# Patient Record
Sex: Female | Born: 1955 | Race: White | Hispanic: No | Smoking: Current some day smoker
Health system: Southern US, Community
[De-identification: ages and names within clinical notes are randomized; demographics above are authoritative.]

## PROBLEM LIST (undated history)

## (undated) DIAGNOSIS — J449 Chronic obstructive pulmonary disease, unspecified: Secondary | ICD-10-CM

## (undated) DIAGNOSIS — I5032 Chronic diastolic (congestive) heart failure: Secondary | ICD-10-CM

## (undated) DIAGNOSIS — G8929 Other chronic pain: Secondary | ICD-10-CM

## (undated) DIAGNOSIS — T50905A Adverse effect of unspecified drugs, medicaments and biological substances, initial encounter: Secondary | ICD-10-CM

## (undated) DIAGNOSIS — N200 Calculus of kidney: Secondary | ICD-10-CM

## (undated) DIAGNOSIS — R739 Hyperglycemia, unspecified: Secondary | ICD-10-CM

## (undated) DIAGNOSIS — E785 Hyperlipidemia, unspecified: Secondary | ICD-10-CM

## (undated) DIAGNOSIS — D3500 Benign neoplasm of unspecified adrenal gland: Secondary | ICD-10-CM

## (undated) DIAGNOSIS — S32000A Wedge compression fracture of unspecified lumbar vertebra, initial encounter for closed fracture: Secondary | ICD-10-CM

## (undated) DIAGNOSIS — I2510 Atherosclerotic heart disease of native coronary artery without angina pectoris: Secondary | ICD-10-CM

## (undated) DIAGNOSIS — J9610 Chronic respiratory failure, unspecified whether with hypoxia or hypercapnia: Secondary | ICD-10-CM

## (undated) DIAGNOSIS — M1990 Unspecified osteoarthritis, unspecified site: Secondary | ICD-10-CM

## (undated) DIAGNOSIS — I10 Essential (primary) hypertension: Secondary | ICD-10-CM

## (undated) DIAGNOSIS — D72829 Elevated white blood cell count, unspecified: Secondary | ICD-10-CM

## (undated) DIAGNOSIS — Z720 Tobacco use: Secondary | ICD-10-CM

## (undated) DIAGNOSIS — E079 Disorder of thyroid, unspecified: Secondary | ICD-10-CM

## (undated) DIAGNOSIS — I219 Acute myocardial infarction, unspecified: Secondary | ICD-10-CM

## (undated) HISTORY — DX: Atherosclerotic heart disease of native coronary artery without angina pectoris: I25.10

## (undated) HISTORY — DX: Benign neoplasm of unspecified adrenal gland: D35.00

## (undated) HISTORY — DX: Essential (primary) hypertension: I10

## (undated) HISTORY — DX: Calculus of kidney: N20.0

## (undated) HISTORY — DX: Chronic diastolic (congestive) heart failure: I50.32

## (undated) HISTORY — PX: CERVICAL BIOPSY: SHX590

## (undated) HISTORY — DX: Wedge compression fracture of unspecified lumbar vertebra, initial encounter for closed fracture: S32.000A

## (undated) HISTORY — PX: TUBAL LIGATION: SHX77

## (undated) HISTORY — DX: Elevated white blood cell count, unspecified: D72.829

## (undated) HISTORY — DX: Hyperlipidemia, unspecified: E78.5

## (undated) HISTORY — DX: Unspecified osteoarthritis, unspecified site: M19.90

## (undated) HISTORY — DX: Other chronic pain: G89.29

## (undated) HISTORY — DX: Disorder of thyroid, unspecified: E07.9

## (undated) HISTORY — DX: Hyperglycemia, unspecified: R73.9

## (undated) HISTORY — DX: Hyperglycemia, unspecified: T50.905A

---

## 2003-07-17 ENCOUNTER — Encounter: Payer: Self-pay | Admitting: Family Medicine

## 2003-07-17 ENCOUNTER — Ambulatory Visit (HOSPITAL_COMMUNITY): Admission: RE | Admit: 2003-07-17 | Discharge: 2003-07-17 | Payer: Self-pay | Admitting: Family Medicine

## 2003-07-21 ENCOUNTER — Encounter: Payer: Self-pay | Admitting: Family Medicine

## 2003-07-21 ENCOUNTER — Ambulatory Visit (HOSPITAL_COMMUNITY): Admission: RE | Admit: 2003-07-21 | Discharge: 2003-07-21 | Payer: Self-pay | Admitting: Family Medicine

## 2003-07-26 ENCOUNTER — Encounter: Payer: Self-pay | Admitting: Family Medicine

## 2003-07-26 ENCOUNTER — Ambulatory Visit (HOSPITAL_COMMUNITY): Admission: RE | Admit: 2003-07-26 | Discharge: 2003-07-26 | Payer: Self-pay | Admitting: Family Medicine

## 2007-06-01 ENCOUNTER — Ambulatory Visit (HOSPITAL_COMMUNITY): Admission: RE | Admit: 2007-06-01 | Discharge: 2007-06-01 | Payer: Self-pay | Admitting: Family Medicine

## 2009-11-20 ENCOUNTER — Ambulatory Visit (HOSPITAL_COMMUNITY): Admission: RE | Admit: 2009-11-20 | Discharge: 2009-11-20 | Payer: Self-pay | Admitting: Family Medicine

## 2009-11-20 ENCOUNTER — Encounter: Payer: Self-pay | Admitting: Internal Medicine

## 2009-11-26 ENCOUNTER — Encounter: Payer: Self-pay | Admitting: Internal Medicine

## 2009-12-24 ENCOUNTER — Ambulatory Visit: Payer: Self-pay | Admitting: Internal Medicine

## 2009-12-24 DIAGNOSIS — J449 Chronic obstructive pulmonary disease, unspecified: Secondary | ICD-10-CM | POA: Insufficient documentation

## 2009-12-24 DIAGNOSIS — J441 Chronic obstructive pulmonary disease with (acute) exacerbation: Secondary | ICD-10-CM | POA: Insufficient documentation

## 2010-01-24 ENCOUNTER — Ambulatory Visit: Payer: Self-pay | Admitting: Internal Medicine

## 2010-07-16 ENCOUNTER — Emergency Department (HOSPITAL_COMMUNITY): Admission: EM | Admit: 2010-07-16 | Discharge: 2010-07-16 | Payer: Self-pay | Admitting: Emergency Medicine

## 2010-07-18 ENCOUNTER — Ambulatory Visit (HOSPITAL_COMMUNITY): Admission: RE | Admit: 2010-07-18 | Discharge: 2010-07-18 | Payer: Self-pay | Admitting: Family Medicine

## 2011-01-30 NOTE — Assessment & Plan Note (Signed)
Summary: 1 month/apc   Copy to:  Dr. Assunta Found Primary Provider/Referring Provider:  Dr. Assunta Found  CC:  1 month follow up - states breathing is improved but is not 100%. .  History of Present Illness: 60 yowf smoker not typically limited but noticing she's been " slowing down a bit"  for around a year assoc with periods of much worse sob assoc with  wheezing and coughing.  December 24, 2009 ov doing better after additon of multiple inhalers but still limited so not able yardwork,  has to walk slow at supermarket.   has tried advair not sure it helped, same for symbicort but never really used it consistently.    January 24, 2010--Presents for 1 month follow up - states breathing is improved but is not 100%.   She is using Symbicort. She has no insurance or rx coverage. She is feeling better but has intermittent cough and congesiton-mainly white. Denies chest pain, dyspnea, orthopnea, hemoptysis, fever, n/v/d, edema, headache,recent travel or antibiotics, weight loss.   Preventive Screening-Counseling & Management  Alcohol-Tobacco     Smoking Status: current  Medications Prior to Update: 1)  Proair Hfa 108 (90 Base) Mcg/act Aers (Albuterol Sulfate) .... 2 Puffs Every 4-6 Hours As Needed 2)  Symbicort 160-4.5 Mcg/act Aero (Budesonide-Formoterol Fumarate) .... 2 Puffs Every 12 Hours 3)  Oxygen 2.5 Liters .... At Bedtime 4)  Albuterol Sulfate (2.5 Mg/7ml) 0.083% Nebu (Albuterol Sulfate) .Marland Kitchen.. 1 in Nebulizer Every 6 Hours As Needed  Current Medications (verified): 1)  Proair Hfa 108 (90 Base) Mcg/act Aers (Albuterol Sulfate) .... 2 Puffs Every 4-6 Hours As Needed 2)  Symbicort 160-4.5 Mcg/act Aero (Budesonide-Formoterol Fumarate) .... 2 Puffs Every 12 Hours 3)  Oxygen 2.5 Liters .... At Bedtime 4)  Albuterol Sulfate (2.5 Mg/85ml) 0.083% Nebu (Albuterol Sulfate) .Marland Kitchen.. 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  Smokes 1ppd No ETOH  Risk Factors: Smoking Status: current (01/24/2010)  Social History: Smoking Status:  current  Review of Systems      See HPI  Vital Signs:  Patient profile:   55 year old female Height:      63 inches Weight:      138 pounds BMI:     24.53 O2 Sat:      95 % on Room air Temp:     96.6 degrees F oral Pulse rate:   87 / minute BP sitting:   122 / 68  (left arm) Cuff size:   regular  Vitals Entered By: Boone Master CNA (January 24, 2010 4:39 PM)  O2 Flow:  Room air CC: 1 month follow up - states breathing is improved but is not 100%.  Is Patient Diabetic? No Comments Medications reviewed with patient Daytime contact number verified with patient. Boone Master CNA  January 24, 2010 4:40 PM    Physical Exam  Additional Exam:  amb wf    wt 132 December 24, 2009 >>138 January 24, 2010 5:10 PM  HEENT mild turbinate edema.  Oropharynx no thrush or excess pnd or cobblestoning.  No JVD or cervical adenopathy. Mild accessory muscle hypertrophy. Trachea midline, nl thryroid. Chest was  hyperinflated by percussion with diminished breath sounds and moderate increased exp time without wheeze. Hoover sign positive at mid inspiration. Regular rate and rhythm without murmur gallop or rub or increase P2 or edema.  Abd: no hsm, nl excursion. Ext warm without cyanosis or clubbing.     Impression & Recommendations:  Problem # 1:  C O P D (ICD-496)  Improved control on rx. however pt does have any health insurance or rx coverage.  Will give samples for now. Have suggested [employer] and/or Medicaid. -she declines at this time.  Rec to follow up Dr. Sherene Sires in  4 weeks w/ PFTs - she does not know is she will come back d/t cost factor We discussed various options and she said she would consider them .   Orders: Est. Patient Level I (56213)  Complete Medication List: 1)  Proair Hfa 108 (90 Base) Mcg/act Aers (Albuterol sulfate) .... 2 puffs every 4-6 hours as needed 2)  Symbicort 160-4.5 Mcg/act Aero (Budesonide-formoterol fumarate) .... 2 puffs every 12 hours 3)  Oxygen 2.5 Liters  .... At bedtime 4)  Albuterol Sulfate (2.5 Mg/58ml) 0.083% Nebu (Albuterol sulfate) .Marland Kitchen.. 1 in nebulizer every 6 hours as needed  Patient Instructions: 1)  Use Spacer with Inhalers.  2)  Continue on Symbicort 160/4.44mcg 2 puffs two times a day , brush, rinse and gargle after use.  3)  Mucinex DM two times a day as needed cough  4)  follow up 1 month with PFTs  Dr. Sherene Sires    Immunization History:  Influenza Immunization History:    Influenza:  declines (01/24/2010)  Pneumovax Immunization History:    Pneumovax:  n/a (01/24/2010)

## 2011-09-24 ENCOUNTER — Inpatient Hospital Stay (HOSPITAL_COMMUNITY)
Admission: EM | Admit: 2011-09-24 | Discharge: 2011-09-27 | DRG: 192 | Disposition: A | Discharge status: Home / Self Care | Payer: Medicaid Other | Attending: Internal Medicine | Admitting: Internal Medicine

## 2011-09-24 ENCOUNTER — Emergency Department (HOSPITAL_COMMUNITY): Payer: Medicaid Other

## 2011-09-24 DIAGNOSIS — Z720 Tobacco use: Secondary | ICD-10-CM | POA: Diagnosis present

## 2011-09-24 DIAGNOSIS — T50905A Adverse effect of unspecified drugs, medicaments and biological substances, initial encounter: Secondary | ICD-10-CM | POA: Diagnosis present

## 2011-09-24 DIAGNOSIS — R7309 Other abnormal glucose: Secondary | ICD-10-CM | POA: Diagnosis present

## 2011-09-24 DIAGNOSIS — R0902 Hypoxemia: Secondary | ICD-10-CM | POA: Diagnosis present

## 2011-09-24 DIAGNOSIS — J441 Chronic obstructive pulmonary disease with (acute) exacerbation: Principal | ICD-10-CM | POA: Diagnosis present

## 2011-09-24 DIAGNOSIS — R739 Hyperglycemia, unspecified: Secondary | ICD-10-CM | POA: Diagnosis present

## 2011-09-24 DIAGNOSIS — F17200 Nicotine dependence, unspecified, uncomplicated: Secondary | ICD-10-CM | POA: Diagnosis present

## 2011-09-24 DIAGNOSIS — T380X5A Adverse effect of glucocorticoids and synthetic analogues, initial encounter: Secondary | ICD-10-CM | POA: Diagnosis present

## 2011-09-24 HISTORY — DX: Chronic obstructive pulmonary disease, unspecified: J44.9

## 2011-09-24 LAB — COMPREHENSIVE METABOLIC PANEL
AST: 18 U/L (ref 0–37)
Albumin: 3.7 g/dL (ref 3.5–5.2)
Alkaline Phosphatase: 91 U/L (ref 39–117)
BUN: 13 mg/dL (ref 6–23)
Chloride: 98 mEq/L (ref 96–112)
Potassium: 3.5 mEq/L (ref 3.5–5.1)
Sodium: 137 mEq/L (ref 135–145)
Total Bilirubin: 0.2 mg/dL — ABNORMAL LOW (ref 0.3–1.2)
Total Protein: 6.5 g/dL (ref 6.0–8.3)

## 2011-09-24 LAB — CBC
HCT: 44 % (ref 36.0–46.0)
MCHC: 33.4 g/dL (ref 30.0–36.0)
Platelets: 281 10*3/uL (ref 150–400)
RDW: 13.8 % (ref 11.5–15.5)
WBC: 14.6 10*3/uL — ABNORMAL HIGH (ref 4.0–10.5)

## 2011-09-24 LAB — MAGNESIUM: Magnesium: 2 mg/dL (ref 1.5–2.5)

## 2011-09-24 LAB — PHOSPHORUS: Phosphorus: 2.6 mg/dL (ref 2.3–4.6)

## 2011-09-24 MED ORDER — ALBUTEROL SULFATE (5 MG/ML) 0.5% IN NEBU
INHALATION_SOLUTION | RESPIRATORY_TRACT | Status: AC
  Filled 2011-09-24: qty 0.5

## 2011-09-24 MED ORDER — SODIUM CHLORIDE 0.9 % IV SOLN
INTRAVENOUS | Status: AC
  Administered 2011-09-24: 75 mL via INTRAVENOUS

## 2011-09-24 MED ORDER — BUDESONIDE-FORMOTEROL FUMARATE 160-4.5 MCG/ACT IN AERO
2.0000 | INHALATION_SPRAY | Freq: Two times a day (BID) | RESPIRATORY_TRACT | Status: DC
  Administered 2011-09-25 – 2011-09-27 (×5): 2 via RESPIRATORY_TRACT
  Filled 2011-09-24: qty 6

## 2011-09-24 MED ORDER — ENOXAPARIN SODIUM 40 MG/0.4ML ~~LOC~~ SOLN
40.0000 mg | SUBCUTANEOUS | Status: DC
  Administered 2011-09-24 – 2011-09-26 (×3): 40 mg via SUBCUTANEOUS
  Filled 2011-09-24 (×3): qty 0.4

## 2011-09-24 MED ORDER — ALBUTEROL SULFATE (5 MG/ML) 0.5% IN NEBU
2.5000 mg | INHALATION_SOLUTION | Freq: Once | RESPIRATORY_TRACT | Status: AC
  Administered 2011-09-24: 2.5 mg via RESPIRATORY_TRACT
  Filled 2011-09-24: qty 0.5

## 2011-09-24 MED ORDER — MOXIFLOXACIN HCL IN NACL 400 MG/250ML IV SOLN
INTRAVENOUS | Status: AC
  Filled 2011-09-24: qty 250

## 2011-09-24 MED ORDER — PREDNISONE 20 MG PO TABS
60.0000 mg | ORAL_TABLET | Freq: Once | ORAL | Status: AC
  Administered 2011-09-24: 60 mg via ORAL
  Filled 2011-09-24: qty 3

## 2011-09-24 MED ORDER — MOXIFLOXACIN HCL IN NACL 400 MG/250ML IV SOLN
400.0000 mg | INTRAVENOUS | Status: AC
  Administered 2011-09-24 – 2011-09-25 (×2): 400 mg via INTRAVENOUS
  Filled 2011-09-24 (×2): qty 250

## 2011-09-24 MED ORDER — PNEUMOCOCCAL VAC POLYVALENT 25 MCG/0.5ML IJ INJ
0.5000 mL | INJECTION | INTRAMUSCULAR | Status: AC
  Administered 2011-09-25: 0.5 mL via INTRAMUSCULAR
  Filled 2011-09-24: qty 0.5

## 2011-09-24 MED ORDER — ALUM & MAG HYDROXIDE-SIMETH 200-200-20 MG/5ML PO SUSP: 30.0000 mL | Freq: Four times a day (QID) | ORAL | Status: DC | PRN

## 2011-09-24 MED ORDER — ONDANSETRON HCL 4 MG/2ML IJ SOLN: 4.0000 mg | Freq: Four times a day (QID) | INTRAMUSCULAR | Status: DC | PRN

## 2011-09-24 MED ORDER — IPRATROPIUM BROMIDE 0.02 % IN SOLN
0.5000 mg | Freq: Once | RESPIRATORY_TRACT | Status: AC
  Administered 2011-09-24: 0.5 mg via RESPIRATORY_TRACT
  Filled 2011-09-24: qty 2.5

## 2011-09-24 MED ORDER — ACETAMINOPHEN 650 MG RE SUPP: 650.0000 mg | Freq: Four times a day (QID) | RECTAL | Status: DC | PRN

## 2011-09-24 MED ORDER — METHYLPREDNISOLONE SODIUM SUCC 125 MG IJ SOLR
60.0000 mg | Freq: Three times a day (TID) | INTRAMUSCULAR | Status: AC
  Administered 2011-09-24 – 2011-09-25 (×4): 60 mg via INTRAVENOUS
  Filled 2011-09-24 (×4): qty 2

## 2011-09-24 MED ORDER — ALBUTEROL SULFATE (5 MG/ML) 0.5% IN NEBU
2.5000 mg | INHALATION_SOLUTION | RESPIRATORY_TRACT | Status: DC
  Administered 2011-09-24 – 2011-09-26 (×10): 2.5 mg via RESPIRATORY_TRACT
  Filled 2011-09-24 (×9): qty 0.5

## 2011-09-24 MED ORDER — IPRATROPIUM BROMIDE 0.02 % IN SOLN
0.5000 mg | RESPIRATORY_TRACT | Status: DC
  Administered 2011-09-24 – 2011-09-26 (×10): 0.5 mg via RESPIRATORY_TRACT
  Filled 2011-09-24 (×8): qty 2.5

## 2011-09-24 MED ORDER — ONDANSETRON HCL 4 MG PO TABS: 4.0000 mg | ORAL_TABLET | Freq: Four times a day (QID) | ORAL | Status: DC | PRN

## 2011-09-24 MED ORDER — TIOTROPIUM BROMIDE MONOHYDRATE 18 MCG IN CAPS
ORAL_CAPSULE | RESPIRATORY_TRACT | Status: AC
  Filled 2011-09-24: qty 5

## 2011-09-24 MED ORDER — ACETAMINOPHEN 325 MG PO TABS
650.0000 mg | ORAL_TABLET | Freq: Four times a day (QID) | ORAL | Status: DC | PRN
  Administered 2011-09-24: 650 mg via ORAL
  Filled 2011-09-24: qty 2

## 2011-09-24 MED ORDER — PANTOPRAZOLE SODIUM 40 MG PO TBEC
40.0000 mg | DELAYED_RELEASE_TABLET | Freq: Every day | ORAL | Status: DC
  Administered 2011-09-24 – 2011-09-27 (×4): 40 mg via ORAL
  Filled 2011-09-24 (×4): qty 1

## 2011-09-24 MED ORDER — TIOTROPIUM BROMIDE MONOHYDRATE 18 MCG IN CAPS
18.0000 ug | ORAL_CAPSULE | Freq: Every day | RESPIRATORY_TRACT | Status: DC
  Administered 2011-09-26 (×2): 18 ug via RESPIRATORY_TRACT
  Filled 2011-09-24: qty 5

## 2011-09-24 MED ORDER — ALBUTEROL SULFATE (5 MG/ML) 0.5% IN NEBU
5.0000 mg | INHALATION_SOLUTION | Freq: Once | RESPIRATORY_TRACT | Status: AC
  Administered 2011-09-24: 5 mg via RESPIRATORY_TRACT
  Filled 2011-09-24: qty 1

## 2011-09-24 MED ORDER — NICOTINE 14 MG/24HR TD PT24
14.0000 mg | MEDICATED_PATCH | Freq: Every day | TRANSDERMAL | Status: DC
  Administered 2011-09-24 – 2011-09-27 (×4): 14 mg via TRANSDERMAL
  Filled 2011-09-24 (×4): qty 1

## 2011-09-24 MED ORDER — MORPHINE SULFATE 2 MG/ML IJ SOLN
1.0000 mg | Freq: Four times a day (QID) | INTRAMUSCULAR | Status: DC | PRN
  Administered 2011-09-26: 1 mg via INTRAVENOUS
  Filled 2011-09-24: qty 1

## 2011-09-24 NOTE — ED Provider Notes (Signed)
History   Chart scribed for Cindy Chick, MD by Enos Fling; the patient was seen in room APA14/APA14; this patient's care was started at 12:59 PM.    CSN: [id number] Arrival date & time: 09/24/2011 11:38 AM  Chief Complaint  Patient presents with  . Shortness of Breath   HPI Cindy [name] is a 55 y.o. female with h/o COPD/emphysema who presents to the Emergency Department complaining of cough. Pt c/o productive cough and wheezing x 1 week, reports she was seen by PCP 6 days ago and prescribed prednisone and augmentin which she finished today with no change in symptoms. Pt has been using albuterol twice per day. No fever, headache, or chest pain. Pt is current smoker. States she feels a little better after first breathing treatment in ED.   PCP Dr. Phillips Odor  Past Medical History  Diagnosis Date  . COPD (chronic obstructive pulmonary disease)     History reviewed. No pertinent past surgical history.  History reviewed. No pertinent family history.  History  Substance Use Topics  . Smoking status: Current Everyday Smoker -- 1.0 packs/day    Types: Cigarettes  . Smokeless tobacco: Not on file  . Alcohol Use:     OB History    Grav Para Term Preterm Abortions TAB SAB Ect Mult Living                  Review of Systems 10 Systems reviewed and are negative for acute change except as noted in the HPI.  Allergies  Review of patient's allergies indicates no known allergies.  Home Medications   Current Outpatient Rx  Name Route Sig Dispense Refill  . AMOXICILLIN-POT CLAVULANATE 875-125 MG PO TABS Oral Take 1 tablet by mouth 2 (two) times daily.      Marland Kitchen PREDNISONE 10 MG PO TABS Oral Take 10 mg by mouth daily. Tapered dose       BP 125/74  Pulse 76  Temp(Src) 98.2 F (36.8 C) (Oral)  Resp 20  Ht 5\' 3"  (1.6 m)  Wt 134 lb (60.782 kg)  BMI 23.74 kg/m2  SpO2 97%  Physical Exam  Nursing note and vitals reviewed. Constitutional: She is oriented to person, place, and  time. She appears well-developed and well-nourished. No distress.  HENT:  Head: Normocephalic.  Mouth/Throat: Mucous membranes are normal.  Eyes: Conjunctivae are normal.  Neck: Normal range of motion. Neck supple.  Cardiovascular: Normal rate, regular rhythm and intact distal pulses.  Exam reveals no gallop and no friction rub.   No murmur heard. Pulmonary/Chest: Effort normal. No respiratory distress. She has wheezes (bilateral). She has no rales. She exhibits no tenderness.       Course breath sounds; Mild decreased air movement; no retractions or increased respiratory effort  Abdominal: Soft. There is no tenderness.  Musculoskeletal: Normal range of motion. She exhibits no edema.  Neurological: She is alert and oriented to person, place, and time.  Skin: Skin is warm and dry. No rash noted.  Psychiatric: She has a normal mood and affect.    ED Course  Procedures - none  Labs Reviewed - No data to display Dg Chest 2 View  09/24/2011  *RADIOLOGY REPORT*  Clinical Data: Long-time smoker with history of COPD, presenting with 39-month history of shortness of breath.  CHEST - 2 VIEW 09/24/2011:  Comparison: Two-view chest x-ray 11/20/2009 [facility] and 06/28/2007 [facility].  Findings: Marked hyperinflation and emphysematous changes throughout both lungs, unchanged.  Mild central peribronchial  thickening, unchanged.  No new pulmonary parenchymal abnormalities. Cardiomediastinal silhouette unremarkable and unchanged. Visualized bony thorax intact.  No significant interval change.  IMPRESSION: COPD/emphysema.  No acute cardiopulmonary disease.  Stable examination.  Original Report Authenticated By: Arnell Sieving, M.D.    MEDICATIONS GIVEN IN ED:  Medications  predniSONE (DELTASONE) 10 MG tablet (not administered)  amoxicillin-clavulanate (AUGMENTIN) 875-125 MG per tablet (not administered)  albuterol (PROVENTIL) (5 MG/ML) 0.5% nebulizer solution 5 mg (5 mg  Nebulization Given 09/24/11 1231)  albuterol (PROVENTIL) (5 MG/ML) 0.5% nebulizer solution 2.5 mg (2.5 mg Nebulization Given 09/24/11 1319)  ipratropium (ATROVENT) 0.02 % nebulizer solution 0.5 mg (0.5 mg Nebulization Given 09/24/11 1318)  predniSONE (DELTASONE) tablet 60 mg (60 mg Oral Given 09/24/11 1308)  albuterol (PROVENTIL) (5 MG/ML) 0.5% nebulizer solution 2.5 mg (2.5 mg Nebulization Given 09/24/11 1424)  ipratropium (ATROVENT) 0.02 % nebulizer solution 0.5 mg (0.5 mg Nebulization Given 09/24/11 1423)     MDM  1:51 PM Pt maintaining O2 sats appro x94-95% on RA.  Re-dosed with steroids, will give tapered course.  Pt beginning 3rd neb treatment now.  STates she feels somewhat improved.  CXR without acute findings but c/w COPD  3:18 PM Pt was O2 sats now 89-91 on RA.  1L [place] started.  D/w pt that she will need admission for furhter treatment.  D/w [facility] for admission- AP team 2   IMPRESSION:COPD exacerbationi No diagnosis found.   SCRIBE ATTESTATION: I personally performed the services described in this documentation, which was scribed in my presence. The recorded information has been reviewed and considered. Cindy Chick, MD         Cindy Chick, MD 09/24/11 1520

## 2011-09-24 NOTE — ED Notes (Signed)
Pt states she has had a cough and wheezing since last week. States she was given prednisone and an antibiotic ( augmentin ) today was last dose. States she is not getting any better

## 2011-09-24 NOTE — H&P (Signed)
PCP:    Dr. Assunta Found  Chief Complaint:  Increase shortness of breath and wheezing  HPI: 55 year old [name] came to the hospital complaining of increased shortness of breath and wheezing. The patient reports that for the last month or so, she has been experiencing increase shortness of breath and has increase the use of her albuterol; recently seen by PCP and finished outpatient tx for mild exacerbation of her COPD without to much changes on her symptoms. Patient denies CP, fever, chills, nausea, vomiting, abdominal pain or any other complaints. Of note patient is still smoking and due to financial difficulties has not been using her Spiriva.   Allergies:  No Known Allergies    Past Medical History  Diagnosis Date  . COPD (chronic obstructive pulmonary disease) Tobacco abuse      History reviewed. No pertinent past surgical history.  Prior to Admission medications   Medication Sig Start Date End Date Taking? Authorizing Provider  amoxicillin-clavulanate (AUGMENTIN) 875-125 MG per tablet Take 1 tablet by mouth 2 (two) times daily.     Yes Historical Provider, MD  budesonide-formoterol (SYMBICORT) 160-4.5 MCG/ACT inhaler Inhale 2 puffs into the lungs 2 (two) times daily.     Yes Historical Provider, MD  predniSONE (DELTASONE) 10 MG tablet Take 10 mg by mouth daily. Tapered dose    Yes Historical Provider, MD    Social History:  reports that she has been smoking Cigarettes.  She has been smoking about 1 pack per day. She reports that she does not use illicit drugs and denies alcohol.  History reviewed. No pertinent family history.  Review of Systems:  Constitutional: Denies fever, chills, diaphoresis, appetite change and fatigue.  HEENT: Denies photophobia, eye pain, redness, hearing loss, ear pain, congestion, sore throat, rhinorrhea, sneezing, mouth sores, trouble swallowing, neck pain, neck stiffness and tinnitus.   Respiratory: Denies SOB, DOE, cough, chest tightness,  and  wheezing.   Cardiovascular: Denies chest pain, palpitations and leg swelling.  Gastrointestinal: Denies nausea, vomiting, abdominal pain, diarrhea, constipation, blood in stool and abdominal distention.  Genitourinary: Denies dysuria, urgency, frequency, hematuria, flank pain and difficulty urinating.  Musculoskeletal: Denies myalgias, back pain, joint swelling, arthralgias and gait problem.  Skin: Denies pallor, rash and wound.  Neurological: Denies dizziness, seizures, syncope, weakness, light-headedness, numbness and headaches.  Hematological: Denies adenopathy. Easy bruising, personal or family bleeding history  Psychiatric/Behavioral: Denies suicidal ideation, mood changes, confusion, nervousness, sleep disturbance and agitation   Physical Exam: Blood pressure 108/86, pulse 100, temperature 98.2 F (36.8 C), temperature source Oral, resp. rate 18, height 5\' 3"  (1.6 m), weight 60.782 kg (134 lb), SpO2 95.00%.  Constitutional: She is oriented to person, place, and time. She appears well-developed and well-nourished. No distress and able to speak in full sentences. HEENT: Normocephalic. Atraumatic; moist mucous membranes; Eyes: PERRLA, EOMI.  Neck: Normal range of motion. Neck supple.  Cardiovascular: Mild tachycardia, no murmurs, no gallops and no friction rub.  Pulmonary/Chest: Effort normal. No respiratory distress. She has diffuse wheezes (bilateral). She has no rales.Course breath sounds; Mild decreased air movement. Abdominal: Soft. There is no tenderness.  Musculoskeletal: Normal range of motion. She exhibits no edema.  Neurological: She is alert and oriented to person, place, and time. NO focal deficit and CN II-XII grossly intact.   Labs on Admission:  Labs on admission has been drawn but, are pending at this moment.  Radiological Exams on Admission: Chest x-ray demonstrating emphysematous changes without acute infiltrates.  Assessment/Plan 1-SOB and wheezing- Patient  shortness  of breath and wheezing secondary to COPD with acute exacerbation. Will start duonebs, antibiotics and solumedrol while continuing her inhalers. 2-Hypoxia- Due to problem number 1; will treat acute exacerbation and evaluate needs of supplemental oxygen as an outpatient. 3-Tobacco abuse- will place nicotine patch and provide cessation counseling. 4-DVT- Will use lovenox.     Time Spent on Admission: 45 minutes.  Om Lizotte 09/24/2011, 4:39 PM

## 2011-09-24 NOTE — ED Notes (Signed)
Resp. Tech in with patient to administer breathing treatment at this time.

## 2011-09-24 NOTE — ED Notes (Signed)
Pt states breathing some better. Waiting on second breathing treatment at this time.

## 2011-09-24 NOTE — ED Notes (Signed)
Dr. Isidoro Donning paged for Dr. Karma Ganja.

## 2011-09-24 NOTE — ED Notes (Signed)
Pt states she finishes her antibiotics today for this breathing problem and does not feel any better. Pt state worse with exertion but is SOB at rest. Pt states unable to see specialist due to cost.

## 2011-09-25 ENCOUNTER — Observation Stay (HOSPITAL_COMMUNITY): Payer: Medicaid Other

## 2011-09-25 DIAGNOSIS — R739 Hyperglycemia, unspecified: Secondary | ICD-10-CM | POA: Diagnosis present

## 2011-09-25 LAB — BASIC METABOLIC PANEL
Chloride: 100 mEq/L (ref 96–112)
GFR calc Af Amer: >60 mL/min (ref >60)
GFR calc non Af Amer: >60 mL/min (ref >60)
Potassium: 4 mEq/L (ref 3.5–5.1)
Sodium: 139 mEq/L (ref 135–145)

## 2011-09-25 LAB — CBC
Hemoglobin: 15.1 g/dL — ABNORMAL HIGH (ref 12.0–15.0)
MCHC: 33.7 g/dL (ref 30.0–36.0)
RDW: 13.7 % (ref 11.5–15.5)
WBC: 12.2 10*3/uL — ABNORMAL HIGH (ref 4.0–10.5)

## 2011-09-25 LAB — HEMOGLOBIN A1C
Hgb A1c MFr Bld: 6.4 % — ABNORMAL HIGH (ref <5.7)
Mean Plasma Glucose: 137 mg/dL — ABNORMAL HIGH (ref <117)

## 2011-09-25 MED ORDER — SODIUM CHLORIDE 0.9 % IJ SOLN
INTRAMUSCULAR | Status: AC
  Administered 2011-09-25: 01:00:00
  Filled 2011-09-25: qty 10

## 2011-09-25 MED ORDER — PREDNISONE 20 MG PO TABS
80.0000 mg | ORAL_TABLET | Freq: Two times a day (BID) | ORAL | Status: DC
  Administered 2011-09-26 – 2011-09-27 (×3): 80 mg via ORAL
  Filled 2011-09-25 (×3): qty 4

## 2011-09-25 MED ORDER — MOXIFLOXACIN HCL 400 MG PO TABS
400.0000 mg | ORAL_TABLET | Freq: Every day | ORAL | Status: DC
  Administered 2011-09-26 – 2011-09-27 (×2): 400 mg via ORAL
  Filled 2011-09-25 (×2): qty 1

## 2011-09-25 MED ORDER — INFLUENZA VIRUS VACC SPLIT PF IM SUSP
0.5000 mL | Freq: Once | INTRAMUSCULAR | Status: AC
  Administered 2011-09-25: 0.5 mL via INTRAMUSCULAR
  Filled 2011-09-25: qty 0.5

## 2011-09-25 NOTE — Progress Notes (Signed)
Subjective: No chest pain, no fever, significant improvement in patient's air movement and breathing overall. Also decrease on her wheezing.  Objective: Vital signs in last 24 hours: Temp:  [97.6 F (36.4 C)-98.3 F (36.8 C)] 97.6 F (36.4 C) (09/27 1427) Pulse Rate:  [77-100] 78  (09/27 1427) Resp:  [16-20] 16  (09/27 1427) BP: (98-130)/(62-86) 100/62 mmHg (09/27 1427) SpO2:  [91 %-97 %] 93 % (09/27 1427) Weight:  [62.9 kg (138 lb 10.7 oz)] 138 lb 10.7 oz (62.9 kg) (09/26 1745) Weight change:  Last BM Date: 09/22/11  Intake/Output from previous day:   Total I/O In: 1155 [I.V.:1155] Out: -    Physical Exam: General: Alert, awake, oriented x3, in no acute distress; able to speak in full sentences. HEENT: No bruits, no goiter. Heart: Regular rate and rhythm, without murmurs, rubs, gallops. Lungs: Mild expiratory wheezing bilaterally, no crackles. Abdomen: Soft, nontender, nondistended, positive bowel sounds. Extremities: No clubbing cyanosis or edema with positive pedal pulses. Neuro: Grossly intact, nonfocal.    Lab Results: Basic Metabolic Panel:  Basename 09/25/11 0349 09/24/11 1739  NA 139 137  K 4.0 3.5  CL 100 98  CO2 30 28  GLUCOSE 115* 211*  BUN 12 13  CREATININE 0.61 0.71  CALCIUM 9.6 9.5  MG -- 2.0  PHOS -- 2.6   Liver Function Tests:  Basename 09/24/11 1739  AST 18  ALT 22  ALKPHOS 91  BILITOT 0.2*  PROT 6.5  ALBUMIN 3.7   CBC:  Basename 09/25/11 0349 09/24/11 1739  WBC 12.2* 14.6*  NEUTROABS -- --  HGB 15.1* 14.7  HCT 44.8 44.0  MCV 90.9 91.3  PLT 290 281    Hemoglobin A1C:  Basename 09/24/11 1739  HGBA1C 6.4*   Thyroid Function Tests:  Basename 09/24/11 1739  TSH 0.354  T4TOTAL --  FREET4 --  T3FREE --  THYROIDAB --   Studies/Results: X-ray Chest Pa And Lateral   09/25/2011  *RADIOLOGY REPORT*  Clinical Data: Shortness of breath, cough, wheezing  CHEST - 2 VIEW  Comparison: September 24, 2011 and November 20, 2009   Findings: The cardiac silhouette, mediastinum, pulmonary vasculature are within normal limits.  Both lungs are clear.  There is hyperexpansion which suggests an element of COPD. There is no acute bony abnormality.  IMPRESSION: There is no evidence of acute cardiac or pulmonary process.  COPD.  Original Report Authenticated By: Brandon Melnick, M.D.   Dg Chest 2 View  09/24/2011  *RADIOLOGY REPORT*  Clinical Data: Long-time smoker with history of COPD, presenting with 58-month history of shortness of breath.  CHEST - 2 VIEW 09/24/2011:  Comparison: Two-view chest x-ray 11/20/2009 [facility] and 06/28/2007 [facility].  Findings: Marked hyperinflation and emphysematous changes throughout both lungs, unchanged.  Mild central peribronchial thickening, unchanged.  No new pulmonary parenchymal abnormalities. Cardiomediastinal silhouette unremarkable and unchanged. Visualized bony thorax intact.  No significant interval change.  IMPRESSION: COPD/emphysema.  No acute cardiopulmonary disease.  Stable examination.  Original Report Authenticated By: Arnell Sieving, M.D.    Medications: Scheduled Meds:   . ipratropium  0.5 mg Nebulization Q4H   And  . albuterol  2.5 mg Nebulization Q4H  . budesonide-formoterol  2 puff Inhalation BID  . enoxaparin  40 mg Subcutaneous Q24H  . influenza  inactive virus vaccine  0.5 mL Intramuscular Once  . methylPREDNISolone sodium succinate  60 mg Intravenous Q8H  . moxifloxacin  400 mg Intravenous Q24H  . moxifloxacin  400 mg Oral q1800  .  nicotine  14 mg Transdermal Daily  . pantoprazole  40 mg Oral Q1200  . pneumococcal 23 valent vaccine  0.5 mL Intramuscular Tomorrow-1000  . predniSONE  80 mg Oral BID  . sodium chloride      . tiotropium  18 mcg Inhalation Daily   Continuous Infusions:   . sodium chloride 75 mL (09/24/11 1736)   PRN Meds:.acetaminophen, acetaminophen, alum & mag hydroxide-simeth, morphine, ondansetron (ZOFRAN) IV,  ondansetron  Assessment/Plan: 1- COPD with acute exacerbation- improved. Continue nebulizers, continue antibiotics and continue steroids. At this point we'll ask nurses to check O2 sats on ambulation in order to determine the need of supplemental oxygen as an outpatient. Will make transition to PO in the morning if she continue improving. 2-Tobacco abuse- continue nicotine patch  3- Hypoxia- Due to acute exacerbation; will evaluate needs of O2 supplementation. 4-Hyperglycemia- drug induced; no hx of diabetes. Expecting resolution once steroids tapered down. 5- DVT prophylaxis- Continue Lovenox.     LOS: 1 day   Bonnie Overdorf 09/25/2011, 2:34 PM

## 2011-09-26 MED ORDER — GUAIFENESIN ER 600 MG PO TB12
600.0000 mg | ORAL_TABLET | Freq: Two times a day (BID) | ORAL | Status: DC
  Administered 2011-09-26 – 2011-09-27 (×2): 600 mg via ORAL
  Filled 2011-09-26 (×2): qty 1

## 2011-09-26 MED ORDER — IPRATROPIUM-ALBUTEROL 18-103 MCG/ACT IN AERO
2.0000 | INHALATION_SPRAY | Freq: Four times a day (QID) | RESPIRATORY_TRACT | Status: DC
  Administered 2011-09-26 (×2): 2 via RESPIRATORY_TRACT
  Filled 2011-09-26: qty 14.7

## 2011-09-26 NOTE — Progress Notes (Signed)
Subjective: Doing great. No acute complaints or overnight events. Breathing comfortable and currently no wheezing. No fever.  Objective: Vital signs in last 24 hours: Temp:  [97.3 F (36.3 C)-98.3 F (36.8 C)] 98.2 F (36.8 C) (09/28 1300) Pulse Rate:  [78-88] 78  (09/28 1300) Resp:  [16-18] 16  (09/28 1300) BP: (118-128)/(78-83) 118/78 mmHg (09/28 1300) SpO2:  [94 %-98 %] 96 % (09/28 1300) Weight change:  Last BM Date: 09/24/11  Intake/Output from previous day: 09/27 0701 - 09/28 0700 In: 2515 [P.O.:1360; I.V.:1155] Out: -  Total I/O In: 480 [P.O.:480] Out: -    Physical Exam: General: Alert, awake, oriented x3, in no acute distress; able to speak in full sentences. HEENT: No bruits, no goiter. Heart: Regular rate and rhythm, without murmurs, rubs, gallops. Lungs: no crackles; scattered ronchi. Abdomen: Soft, nontender, nondistended, positive bowel sounds. Extremities: No clubbing cyanosis or edema with positive pedal pulses. Neuro: Grossly intact, nonfocal.    Lab Results: Basic Metabolic Panel:  Basename 09/25/11 0349 09/24/11 1739  NA 139 137  K 4.0 3.5  CL 100 98  CO2 30 28  GLUCOSE 115* 211*  BUN 12 13  CREATININE 0.61 0.71  CALCIUM 9.6 9.5  MG -- 2.0  PHOS -- 2.6   Liver Function Tests:  Basename 09/24/11 1739  AST 18  ALT 22  ALKPHOS 91  BILITOT 0.2*  PROT 6.5  ALBUMIN 3.7   CBC:  Basename 09/25/11 0349 09/24/11 1739  WBC 12.2* 14.6*  NEUTROABS -- --  HGB 15.1* 14.7  HCT 44.8 44.0  MCV 90.9 91.3  PLT 290 281    Hemoglobin A1C:  Basename 09/24/11 1739  HGBA1C 6.4*   Thyroid Function Tests:  Basename 09/24/11 1739  TSH 0.354  T4TOTAL --  FREET4 --  T3FREE --  THYROIDAB --   Studies/Results: X-ray Chest Pa And Lateral   09/25/2011  *RADIOLOGY REPORT*  Clinical Data: Shortness of breath, cough, wheezing  CHEST - 2 VIEW  Comparison: September 24, 2011 and November 20, 2009  Findings: The cardiac silhouette, mediastinum,  pulmonary vasculature are within normal limits.  Both lungs are clear.  There is hyperexpansion which suggests an element of COPD. There is no acute bony abnormality.  IMPRESSION: There is no evidence of acute cardiac or pulmonary process.  COPD.  Original Report Authenticated By: Brandon Melnick, M.D.    Medications: Scheduled Meds:    . albuterol-ipratropium  2 puff Inhalation Q6H  . budesonide-formoterol  2 puff Inhalation BID  . enoxaparin  40 mg Subcutaneous Q24H  . guaiFENesin  600 mg Oral BID  . methylPREDNISolone sodium succinate  60 mg Intravenous Q8H  . moxifloxacin  400 mg Intravenous Q24H  . moxifloxacin  400 mg Oral q1800  . nicotine  14 mg Transdermal Daily  . pantoprazole  40 mg Oral Q1200  . predniSONE  80 mg Oral BID  . DISCONTD: albuterol  2.5 mg Nebulization Q4H  . DISCONTD: ipratropium  0.5 mg Nebulization Q4H  . DISCONTD: tiotropium  18 mcg Inhalation Daily   Continuous Infusions:  PRN Meds:.acetaminophen, acetaminophen, alum & mag hydroxide-simeth, morphine, ondansetron (ZOFRAN) IV, ondansetron  Assessment/Plan: 1- COPD with acute exacerbation- improved. Transitioned to by mouth medications has been done today. Patient is doing much better and if she doesn't experienced rebound bronchospasm with PO meds, will d/c home in am. Currently no wheezing and her O2 sats are in the mid 90's range on 1L of Oxygen. Will add Mucinex to help with expectoration. 2-Tobacco  abuse- continue nicotine patch. 3- Hypoxia- Due to acute exacerbation; now pretty much resolved; will D/C oxygen and follow O2 sats.Marland Kitchen 4-Hyperglycemia- drug induced; no hx of diabetes. Improved with tapering on steroids. 5- DVT prophylaxis- Continue Lovenox.     LOS: 2 days   Neko Boyajian 09/26/2011, 4:11 PM

## 2011-09-26 NOTE — Progress Notes (Signed)
DISCHARGE PLANNING FOR FOLLOW UP: PCP DR.GOLDING HOWEVER PATIENT STATES CANNOT CONTINUE TO PAY CO-PAY FOR VISITS, HAS APPLIED FOR MEDICAID THIS HOSPITALIZATION. CASE MANAGER CONTACTED OFFICE MANAGER JOAN HALL WITH DR. Anthony Sar OFFICE TO SEE IF PATIENT COULD BE SEEN UNDER [place] DISCOUNT. PATIENT GIVEN APPT FOR 10/10/11 TIME 9AM, PATIENT HAS BEEN INFORMED OF THIS APPT, ALSO PLACED APPT DATE AND TIME ON D/C INSTRUCTIONS.

## 2011-09-27 MED ORDER — PANTOPRAZOLE SODIUM 40 MG PO TBEC: 40.0000 mg | DELAYED_RELEASE_TABLET | Freq: Every day | ORAL | Status: DC

## 2011-09-27 MED ORDER — IPRATROPIUM-ALBUTEROL 18-103 MCG/ACT IN AERO: 2.0000 | INHALATION_SPRAY | Freq: Three times a day (TID) | RESPIRATORY_TRACT | Status: DC

## 2011-09-27 MED ORDER — IPRATROPIUM-ALBUTEROL 18-103 MCG/ACT IN AERO: 2.0000 | INHALATION_SPRAY | Freq: Four times a day (QID) | RESPIRATORY_TRACT | Status: DC

## 2011-09-27 MED ORDER — BUDESONIDE-FORMOTEROL FUMARATE 160-4.5 MCG/ACT IN AERO: 2.0000 | INHALATION_SPRAY | Freq: Two times a day (BID) | RESPIRATORY_TRACT | Status: DC

## 2011-09-27 MED ORDER — GUAIFENESIN ER 600 MG PO TB12: 600.0000 mg | ORAL_TABLET | Freq: Two times a day (BID) | ORAL | Status: DC

## 2011-09-27 MED ORDER — PREDNISONE 20 MG PO TABS: 60.0000 mg | ORAL_TABLET | Freq: Two times a day (BID) | ORAL | Status: AC

## 2011-09-27 MED ORDER — NICOTINE 14 MG/24HR TD PT24: 1.0000 | MEDICATED_PATCH | Freq: Every day | TRANSDERMAL | Status: DC

## 2011-09-27 MED ORDER — IPRATROPIUM-ALBUTEROL 18-103 MCG/ACT IN AERO
2.0000 | INHALATION_SPRAY | Freq: Three times a day (TID) | RESPIRATORY_TRACT | Status: DC
  Administered 2011-09-27 (×2): 2 via RESPIRATORY_TRACT

## 2011-09-27 MED ORDER — MOXIFLOXACIN HCL 400 MG PO TABS: 400.0000 mg | ORAL_TABLET | Freq: Every day | ORAL | Status: AC

## 2011-09-27 NOTE — Discharge Summary (Signed)
Physician Discharge Summary  Patient ID: Cindy [name] MRN: [id number] DOB/AGE: 1956-06-06 55 y.o.  Admit date: 09/24/2011 Discharge date: 09/27/2011  Primary Care Physician:  Dr. Phillips Odor. But secondary to financial problems and difficulty paying co-pays patient will follow with Dr. Syliva Overman.   Discharge Diagnoses:    Present on Admission:  .COPD with acute exacerbation .Tobacco abuse .Hypoxia .Hyperglycemia, drug-induced  Current Discharge Medication List    START taking these medications   Details  albuterol-ipratropium (COMBIVENT) 18-103 MCG/ACT inhaler Inhale 2 puffs into the lungs 4 (four) times daily. Qty: 1 Inhaler, Refills: 1    guaiFENesin (MUCINEX) 600 MG 12 hr tablet Take 1 tablet (600 mg total) by mouth 2 (two) times daily. Qty: 20 tablet, Refills: 0    moxifloxacin (AVELOX) 400 MG tablet Take 1 tablet (400 mg total) by mouth daily at 6 PM. Qty: 6 tablet, Refills: 0    nicotine (NICODERM CQ - DOSED IN MG/24 HOURS) 14 mg/24hr patch Place 1 patch onto the skin daily. Qty: 28 patch, Refills: 1    pantoprazole (PROTONIX) 40 MG tablet Take 1 tablet (40 mg total) by mouth daily at 12 noon. Qty: 30 tablet, Refills: 1      CONTINUE these medications which have CHANGED   Details  budesonide-formoterol (SYMBICORT) 160-4.5 MCG/ACT inhaler Inhale 2 puffs into the lungs 2 (two) times daily. Qty: 1 Inhaler, Refills: 1    predniSONE (DELTASONE) 20 MG tablet Take 3 tablets (60 mg total) by mouth 2 (two) times daily. Then 3 tablets by mouth once a day x2 days, then 2 tablets by mouth once a day x2 days, then one tablet by mouth once a day x2 days, then half tablet by mouth once a day x2 days and then stop prednisone. Qty: 20 tablet, Refills: 0      STOP taking these medications     amoxicillin-clavulanate (AUGMENTIN) 875-125 MG per tablet          Disposition and Follow-up: Patient has been discharged in a stable and improved condition no complaining of any  shortness of breath, chest pain, fever, and without hypoxia on room air. Arrangements have been made for patient to follow with Dr. Lodema Hong as instructed and she has been advised to take her medications as prescribed in order to finish steroids tapering dose and antibiotics. She has been started on Combivent and will continue using Symbicort; for that adjustment in to her COPD regimen will be done by Dr. Lodema Hong during her followup. Patient has been instructed to stop smoking, prescriptions for nicotine patch has been given. Patient will require a CT scan with the stability application/Medicaid application and while this is occurring she will require assistance with her medications.  Consults:   None  Significant Diagnostic Studies:  X-ray Chest Pa And Lateral   09/25/2011  *RADIOLOGY REPORT*  Clinical Data: Shortness of breath, cough, wheezing  CHEST - 2 VIEW  Comparison: September 24, 2011 and November 20, 2009  Findings: The cardiac silhouette, mediastinum, pulmonary vasculature are within normal limits.  Both lungs are clear.  There is hyperexpansion which suggests an element of COPD. There is no acute bony abnormality.  IMPRESSION: There is no evidence of acute cardiac or pulmonary process.  COPD.  Original Report Authenticated By: Brandon Melnick, M.D.   Dg Chest 2 View  09/24/2011  *RADIOLOGY REPORT*  Clinical Data: Long-time smoker with history of COPD, presenting with 75-month history of shortness of breath.  CHEST - 2 VIEW 09/24/2011:  Comparison: Two-view  chest x-ray 11/20/2009 [facility] and 06/28/2007 [facility].  Findings: Marked hyperinflation and emphysematous changes throughout both lungs, unchanged.  Mild central peribronchial thickening, unchanged.  No new pulmonary parenchymal abnormalities. Cardiomediastinal silhouette unremarkable and unchanged. Visualized bony thorax intact.  No significant interval change.  IMPRESSION: COPD/emphysema.  No acute  cardiopulmonary disease.  Stable examination.  Original Report Authenticated By: Arnell Sieving, M.D.    Brief H and P: For complete details please refer to admission H and P, but in brief a 55 year old female with past medical history of COPD and tobacco abuse; who came into the hospital complaining of low-grade fever about a cough and increased shortness of breath and wheezing. In the emergency department patient was found to be hypoxic and with a significant decrease air movement and wheezing. [facility] was call for admission and to provide further evaluation and treatment.    Hospital Course:  1-COPD with acute exacerbation- patient received nebulizer treatments, IV steroids, antibiotics and was initially placed on supplemental oxygen. By the time of discharge patient was having good oxygen saturation on room air, was no longer wheezing, having a good air movement bilaterally and has received instructions to stop smoking and to take her medications as prescribed (in order to finish steroids tapering dose and antibiotics). 2-Tobacco abuse-smoking cessation counseling and nicotine patch were provided during this hospitalization. Patient reports that she will quit smokingHypertension 3-Hypoxia- secondary to problem #1, resolved by the time the patient was discharge. 4-Hyperglycemia, drug-induced- no history of diabetes, hemoglobin A1c within normal limits; hyperglycemia most likely induced by steroids. At the moment that we taper down the steroids and expecting that her blood sugar will also return to normal. Further followup to be given by primary care physician as an outpatient.   Time spent on Discharge: 40 minutes  Signed: Jontay Maston 09/27/2011, 4:20 PM

## 2011-10-08 ENCOUNTER — Telehealth: Payer: Self-pay | Admitting: Family Medicine

## 2011-10-08 NOTE — Telephone Encounter (Signed)
Will discuss with pt when she comes to appt

## 2011-10-10 ENCOUNTER — Encounter: Payer: Self-pay | Admitting: Family Medicine

## 2011-10-10 ENCOUNTER — Ambulatory Visit (INDEPENDENT_AMBULATORY_CARE_PROVIDER_SITE_OTHER): Payer: Self-pay | Admitting: Family Medicine

## 2011-10-10 ENCOUNTER — Telehealth: Payer: Self-pay | Admitting: Family Medicine

## 2011-10-10 VITALS — BP 100/66 | HR 89 | Resp 16 | Ht 62.5 in | Wt 136.1 lb

## 2011-10-10 DIAGNOSIS — F17200 Nicotine dependence, unspecified, uncomplicated: Secondary | ICD-10-CM

## 2011-10-10 DIAGNOSIS — J449 Chronic obstructive pulmonary disease, unspecified: Secondary | ICD-10-CM

## 2011-10-10 DIAGNOSIS — Z720 Tobacco use: Secondary | ICD-10-CM

## 2011-10-10 DIAGNOSIS — R7309 Other abnormal glucose: Secondary | ICD-10-CM

## 2011-10-10 DIAGNOSIS — R739 Hyperglycemia, unspecified: Secondary | ICD-10-CM

## 2011-10-10 DIAGNOSIS — J4489 Other specified chronic obstructive pulmonary disease: Secondary | ICD-10-CM

## 2011-10-10 DIAGNOSIS — T50905A Adverse effect of unspecified drugs, medicaments and biological substances, initial encounter: Secondary | ICD-10-CM

## 2011-10-10 MED ORDER — BUDESONIDE-FORMOTEROL FUMARATE 160-4.5 MCG/ACT IN AERO: 2.0000 | INHALATION_SPRAY | Freq: Two times a day (BID) | RESPIRATORY_TRACT | Status: DC

## 2011-10-10 MED ORDER — ALBUTEROL 90 MCG/ACT IN AERS: 2.0000 | INHALATION_SPRAY | RESPIRATORY_TRACT | Status: DC | PRN

## 2011-10-10 MED ORDER — TIOTROPIUM BROMIDE MONOHYDRATE 18 MCG IN CAPS: 18.0000 ug | ORAL_CAPSULE | Freq: Every day | RESPIRATORY_TRACT | Status: DC

## 2011-10-10 NOTE — Assessment & Plan Note (Signed)
Her A1c is elevated however she states she's been on steroids almost monthly for the past 3-4 months. Therefore this is likely an adequate estimation regarding diabetes. When she has had a period of at least 3 months free of steroids I will obtain another A1c.

## 2011-10-10 NOTE — Progress Notes (Signed)
  Subjective:    Patient ID: Cindy [name], female    DOB: 1956-08-02, 55 y.o.   MRN: [id number]  HPI -- Pt here to establish care, previous PCP Dr. Phillips Odor at [facility] , Pt discharged from [facility] after COPD exacerbation on 9/29, she is not able to afford to see her previous PCP  COPD- diagnosed in Nov 2010, pt seen by [facility] in the past but she does not remember any details, her previous inahlers- Spiriva, albuterol, symbicort,  Currently has Symbicort and Combivent, she uses whatever is available via samples at her previous PCP office -- She is very concerned about her x-ray- she wants to know how bad  Smokes 1ppd >30 years She quit her previous job because of her COPD She had an acute exacerbation treated as an outpatient- then admitted 1 week later to [facility], with COPD exacerbation. She has been on multiple rounds of prednisone Pt sent home with steroid taper, inhalers, and Avelox. Pt stopped Avelox after 4 pills secondary to allergic reaction- had itching and rash on lower ext, she has 2 pills left in bottle   Acid reflux- not able to afford her PPI  Hyperglycemia secondary to steroid use- A1C 6.4% in sept 2012  Thyroid disease- had a biopsy procedure, no longer on replacement medication because of  She could not afford it.  No Mammogram, no Colonscopy, no PAP smear in many years    Review of Systems GEN- +fatigue, fever, weight loss,weakness, +recent illness HEENT- denies eye drainage, +change in vision- wears glasses, nasal discharge, CVS- denies chest pain, palpitations, occ leg edema RESP- + SOB, +cough, +wheeze ABD- denies N/V, change in stools, abd pain GU- denies dysuria, hematuria, dribbling, incontinence MSK- + joint pain, +muscle aches in back with and underbreast with cough and SOB, injury Neuro- denies headache, dizziness, syncope, seizure activity       Objective:   Physical Exam GEN- NAD, alert and oriented x3 HEENT- PERRL, EOMI, MMM, oropharynx  clear Neck- Supple,  CVS- RRR, no murmur, distant HS RESP-inspiratory and expiratory wheeze bilat, decreased BS bases, no rhonchi, increase end expiratory phase, normal WOB at rest, no retractions, +cough EXT- No edema Pulses- Radial, DP- 2+        Assessment & Plan:   For the Medication assitance program- I will send a prescription for albuterol, Spirva and Symbicrot

## 2011-10-10 NOTE — Assessment & Plan Note (Signed)
Patient advised to quit smoking as her COPD has worsened over the past couple of years. . She states that she will work on tobacco cessation. I recommended nicotine gum or patches which she will have to pay for out of pocket she is unsure

## 2011-10-10 NOTE — Assessment & Plan Note (Signed)
She is very concerned about how severe her COPD is which cannot be determined from the x-ray. She's not had pulmonary function tests from what I can see however his uninsured. She was seen by pulmonologist at one point a few years ago. She is unsure what workup was done at that time. I will obtain records from her previous PCP. She will be a difficult case as she is uninsured and switches off her inhalers very often depending on what samples she has. I will set her up with the medication assistance program. The plan will to put her on a short acting beta agonist, Spiriva and inhaled corticosteroid. For now she will use the medication she has at home as described below. She's just completed steroid taper I do not feel that she needs more steroids at this time. She has filed for disability and Medicaid.

## 2011-10-10 NOTE — Patient Instructions (Signed)
For now take the Combivent and the Symbicort When you run out, start the advair (purple) - 1 puff twice a day and albuterol (Ventolin) 2 puffs every 4 hours Call if your breathing gets worse  I advise you to quit smoking Try nicotine patches or gum We will try to get some medications from the assistance program  F/u in 2 weeks for your breathing

## 2011-10-15 NOTE — Telephone Encounter (Signed)
She does not have to take aspirin daily. But if she wants to for protection 81mg - baby aspirin.

## 2011-10-16 NOTE — Telephone Encounter (Signed)
Wanted you to know she is staying out of breath and her ribs are still very sore even after the xray was normal. Asked if she needed to be seen and she said no just tell you

## 2011-10-17 NOTE — Telephone Encounter (Signed)
Note I did not send her for another x-ray. She has COPD, she should take her inhalers like we discussed. The instructions are written on the paper. She needs to quit smoking. If her breathing is that bad she can come in earlier to be seen.

## 2011-10-24 ENCOUNTER — Encounter: Payer: Self-pay | Admitting: Family Medicine

## 2011-10-24 ENCOUNTER — Ambulatory Visit (INDEPENDENT_AMBULATORY_CARE_PROVIDER_SITE_OTHER): Payer: Self-pay | Admitting: Family Medicine

## 2011-10-24 VITALS — BP 98/64 | HR 72 | Resp 16 | Ht 63.0 in | Wt 135.1 lb

## 2011-10-24 DIAGNOSIS — Z720 Tobacco use: Secondary | ICD-10-CM

## 2011-10-24 DIAGNOSIS — J449 Chronic obstructive pulmonary disease, unspecified: Secondary | ICD-10-CM

## 2011-10-24 DIAGNOSIS — F17200 Nicotine dependence, unspecified, uncomplicated: Secondary | ICD-10-CM

## 2011-10-24 DIAGNOSIS — R21 Rash and other nonspecific skin eruption: Secondary | ICD-10-CM

## 2011-10-24 NOTE — Progress Notes (Signed)
  Subjective:    Patient ID: Cindy [name], female    DOB: 07-22-1956, 55 y.o.   MRN: [id number]  HPI   COPD- Was sick last week-last Thursday, had fever, chills, muscle aches and SOB. Feels okay today, out of advair, has combivent,  Continues to have cough with mild production, using mucinex, no SOB  Tobacco- continues to smoke   Rash- has rash on arms, has been picking at the 2 lesions, no sick contacts, not sure what she was exposed to, states red spots just pop up. ROS-No fever, no chills, no new meds/lotion/detergent     Review of Systems - per above     Objective:   Physical Exam  GEN- NAD, alert and oriented, smells of tobacco  CVS- RRR, no murmur RESP- scattered wheeze, normal WOB, occ cough, good air movement  EXT- no edema Skin- 2 scabbed lesions, mild erythema on left forearm, hemagioma on right arm       Assessment & Plan:

## 2011-10-24 NOTE — Patient Instructions (Addendum)
Please call the health dept pharmacy back- [facility] - to reschedule your appt so you can get your inhalers Continue the Combivent and Symbicort  For your skin- keep the open areas clean- apply the bacitracin once a day for 7 days Use the mucinex as needed When your insurance comes in we will get lung function testing Let social services know to get records from your old doctor I recommend you quit smoking Next appt in 4 months- Make this a physical for a PAP Smear

## 2011-10-26 ENCOUNTER — Encounter: Payer: Self-pay | Admitting: Family Medicine

## 2011-10-26 DIAGNOSIS — R21 Rash and other nonspecific skin eruption: Secondary | ICD-10-CM | POA: Insufficient documentation

## 2011-10-26 NOTE — Assessment & Plan Note (Signed)
No evidence of acute exacerbation today. She has appt with medication assistance on Monday to get inhalers For now continue Combivent

## 2011-10-26 NOTE — Assessment & Plan Note (Signed)
No specific rash- has 2 old lesions she is picking at. Given bacitracin to place on a few lesions

## 2011-10-26 NOTE — Assessment & Plan Note (Signed)
Unchanged

## 2011-10-29 ENCOUNTER — Encounter: Payer: Self-pay | Admitting: Family Medicine

## 2011-10-29 NOTE — Progress Notes (Signed)
  Subjective:    Patient ID: Cindy [name], female    DOB: 1956-02-28, 55 y.o.   MRN: [id number]  HPI    Review of Systems     Objective:   Physical Exam        Assessment & Plan:   I received notes from her previous doctor at [facility]. She was basically seen for COPD exacerbations for the past few years. She had been on many different inhalers secondary to being uninsured he would give her samples of whatever they had at the time. She was seen by neurosurgery for a compression fracture the MRI and neurosurgery note will be scanned into the chart.

## 2011-11-04 ENCOUNTER — Telehealth: Payer: Self-pay

## 2011-11-04 ENCOUNTER — Emergency Department (HOSPITAL_COMMUNITY)
Admission: EM | Admit: 2011-11-04 | Discharge: 2011-11-04 | Discharge status: Against Medical Advice | Payer: Medicaid Other | Attending: Emergency Medicine | Admitting: Emergency Medicine

## 2011-11-04 ENCOUNTER — Encounter (HOSPITAL_COMMUNITY): Payer: Self-pay | Admitting: Emergency Medicine

## 2011-11-04 ENCOUNTER — Emergency Department (HOSPITAL_COMMUNITY): Payer: Medicaid Other

## 2011-11-04 DIAGNOSIS — Z7982 Long term (current) use of aspirin: Secondary | ICD-10-CM | POA: Insufficient documentation

## 2011-11-04 DIAGNOSIS — J441 Chronic obstructive pulmonary disease with (acute) exacerbation: Secondary | ICD-10-CM | POA: Insufficient documentation

## 2011-11-04 DIAGNOSIS — F17200 Nicotine dependence, unspecified, uncomplicated: Secondary | ICD-10-CM | POA: Insufficient documentation

## 2011-11-04 LAB — DIFFERENTIAL
Basophils Relative: 1 % (ref 0–1)
Eosinophils Absolute: 0 10*3/uL (ref 0.0–0.7)
Eosinophils Relative: 0 % (ref 0–5)
Lymphs Abs: 2.2 10*3/uL (ref 0.7–4.0)
Monocytes Absolute: 0.7 10*3/uL (ref 0.1–1.0)
Monocytes Relative: 9 % (ref 3–12)
Neutrophils Relative %: 60 % (ref 43–77)

## 2011-11-04 LAB — BASIC METABOLIC PANEL
BUN: 5 mg/dL — ABNORMAL LOW (ref 6–23)
Calcium: 9.7 mg/dL (ref 8.4–10.5)
Creatinine, Ser: 0.63 mg/dL (ref 0.50–1.10)
GFR calc Af Amer: >90 mL/min (ref >90)
GFR calc non Af Amer: >90 mL/min (ref >90)
Glucose, Bld: 83 mg/dL (ref 70–99)

## 2011-11-04 LAB — CBC
HCT: 46.6 % — ABNORMAL HIGH (ref 36.0–46.0)
Hemoglobin: 14.9 g/dL (ref 12.0–15.0)
MCH: 29.6 pg (ref 26.0–34.0)
MCHC: 32 g/dL (ref 30.0–36.0)
MCV: 92.5 fL (ref 78.0–100.0)
RBC: 5.04 MIL/uL (ref 3.87–5.11)

## 2011-11-04 MED ORDER — IPRATROPIUM BROMIDE 0.02 % IN SOLN
0.5000 mg | Freq: Once | RESPIRATORY_TRACT | Status: AC
  Administered 2011-11-04: 0.5 mg via RESPIRATORY_TRACT
  Filled 2011-11-04: qty 2.5

## 2011-11-04 MED ORDER — HYDROCOD POLST-CHLORPHEN POLST 10-8 MG/5ML PO LQCR
5.0000 mL | Freq: Once | ORAL | Status: AC
  Administered 2011-11-04: 5 mL via ORAL
  Filled 2011-11-04: qty 5

## 2011-11-04 MED ORDER — ALBUTEROL SULFATE (5 MG/ML) 0.5% IN NEBU
5.0000 mg | INHALATION_SOLUTION | Freq: Once | RESPIRATORY_TRACT | Status: AC
  Administered 2011-11-04: 5 mg via RESPIRATORY_TRACT
  Filled 2011-11-04: qty 1

## 2011-11-04 MED ORDER — POTASSIUM CHLORIDE CRYS ER 20 MEQ PO TBCR
20.0000 meq | EXTENDED_RELEASE_TABLET | Freq: Once | ORAL | Status: AC
  Administered 2011-11-04: 20 meq via ORAL
  Filled 2011-11-04: qty 1

## 2011-11-04 MED ORDER — ALBUTEROL SULFATE (5 MG/ML) 0.5% IN NEBU
10.0000 mg | INHALATION_SOLUTION | Freq: Once | RESPIRATORY_TRACT | Status: AC
  Administered 2011-11-04: 10 mg via RESPIRATORY_TRACT
  Filled 2011-11-04 (×3): qty 2

## 2011-11-04 MED ORDER — PREDNISONE 20 MG PO TABS
60.0000 mg | ORAL_TABLET | Freq: Once | ORAL | Status: AC
  Administered 2011-11-04: 60 mg via ORAL
  Filled 2011-11-04: qty 3

## 2011-11-04 NOTE — Telephone Encounter (Signed)
Noted  

## 2011-11-04 NOTE — ED Notes (Signed)
I spoke to patient at length asking her to reconsider staying.  In addition to having the risks/benefits of leaving AMA discussed w/ her by Dr. Lendell Caprice and J. Idol, PA-C, I also spoke with her at same.  Pt is fully aware of the risks and benefits of treatment and still wishes to leave AMA.  E-sig form printed and signed by patient and myself.

## 2011-11-04 NOTE — ED Provider Notes (Signed)
History     CSN: [id number] Arrival date & time: 11/04/2011 10:32 AM   First MD Initiated Contact with Patient 11/04/11 1055      Chief Complaint  Patient presents with  . Shortness of Breath    wheezing  . Cough    productive-yellow phelgm    (Consider location/radiation/quality/duration/timing/severity/associated sxs/prior treatment) Patient is a 55 y.o. female presenting with shortness of breath and cough. The history is provided by the patient.  Shortness of Breath  The current episode started 2 days ago. The onset was gradual. The problem occurs continuously. The problem has been gradually worsening. The problem is severe. The symptoms are relieved by nothing. The symptoms are aggravated by activity. Associated symptoms include cough, shortness of breath and wheezing. Pertinent negatives include no chest pain, no chest pressure, no fever, no rhinorrhea, no sore throat and no stridor. She has had prior hospitalizations (She was admitted 9/12 for  COPD exacerbation). There were no sick contacts.  Cough Associated symptoms include shortness of breath and wheezing. Pertinent negatives include no chest pain, no headaches, no rhinorrhea and no sore throat.    Past Medical History  Diagnosis Date  . COPD (chronic obstructive pulmonary disease)   . Shortness of breath   . Thyroid disease   . Compression fracture of lumbar vertebra     Past Surgical History  Procedure Date  . Tubal ligation   . Cervical biopsy     Family History  Problem Relation Age of Onset  . Heart disease Mother   . Hypertension Sister   . Hypertension Brother     History  Substance Use Topics  . Smoking status: Current Everyday Smoker -- 1.0 packs/day    Types: Cigarettes  . Smokeless tobacco: Not on file  . Alcohol Use: No    OB History    Grav Para Term Preterm Abortions TAB SAB Ect Mult Living                  Review of Systems  Constitutional: Negative for fever.  HENT: Negative for  congestion, sore throat, rhinorrhea and neck pain.   Eyes: Negative.   Respiratory: Positive for cough, shortness of breath and wheezing. Negative for chest tightness and stridor.   Cardiovascular: Negative for chest pain.  Gastrointestinal: Negative for nausea and abdominal pain.  Genitourinary: Negative.   Musculoskeletal: Negative for joint swelling and arthralgias.  Skin: Negative.  Negative for rash and wound.  Neurological: Positive for weakness. Negative for dizziness, light-headedness, numbness and headaches.  Hematological: Negative.   Psychiatric/Behavioral: Negative.     Allergies  Prednisone and Avelox  Home Medications   Current Outpatient Rx  Name Route Sig Dispense Refill  . IPRATROPIUM-ALBUTEROL 18-103 MCG/ACT IN AERO Inhalation Inhale 1 puff into the lungs 4 (four) times daily.      . ASPIRIN EC 81 MG PO TBEC Oral Take 81 mg by mouth daily.      . BUDESONIDE-FORMOTEROL FUMARATE 160-4.5 MCG/ACT IN AERO Inhalation Inhale 1 puff into the lungs 2 (two) times daily.      Marland Kitchen TIOTROPIUM BROMIDE MONOHYDRATE 18 MCG IN CAPS Inhalation Place 1 capsule (18 mcg total) into inhaler and inhale daily. 30 capsule 3    BP 102/76  Pulse 108  Temp(Src) 98.2 F (36.8 C) (Oral)  Resp 18  Ht 5\' 2"  (1.575 m)  Wt 135 lb (61.236 kg)  BMI 24.69 kg/m2  SpO2 97%  Physical Exam  Nursing note and vitals reviewed. Constitutional: She is oriented  to person, place, and time. She appears well-developed and well-nourished.  HENT:  Head: Normocephalic and atraumatic.  Eyes: Conjunctivae are normal.  Neck: Normal range of motion.  Cardiovascular: Normal rate, regular rhythm, normal heart sounds and intact distal pulses.   Pulmonary/Chest: Effort normal. No respiratory distress. She has wheezes. She has no rales. She exhibits no tenderness.       Wheezing throughout all lung fields.  Prolonged expirations.  No rhonchi appreciated.  Abdominal: Soft. Bowel sounds are normal. There is no  tenderness.  Musculoskeletal: Normal range of motion.  Neurological: She is alert and oriented to person, place, and time.  Skin: Skin is warm and dry. No cyanosis. Nails show clubbing.  Psychiatric: She has a normal mood and affect.    ED Course  Procedures (including critical care time)  Labs Reviewed  CBC - Abnormal; Notable for the following:    HCT 46.6 (*)    All other components within normal limits  BASIC METABOLIC PANEL - Abnormal; Notable for the following:    Potassium 3.0 (*)    BUN 5 (*)    All other components within normal limits  DIFFERENTIAL   Dg Chest 2 View  11/04/2011  *RADIOLOGY REPORT*  Clinical Data: Productive cough, shortness of breath, wheezing, history COPD/emphysema, smoking  CHEST - 2 VIEW  Comparison: 09/25/2011  Findings: Normal heart size, mediastinal contours, and pulmonary vascularity. Emphysematous and chronic bronchitic changes. No pulmonary infiltrate, pleural effusion, or pneumothorax. Bones demineralized.  IMPRESSION: Emphysematous and bronchitic changes. No acute abnormalities.  Original Report Authenticated By: Lollie Marrow, M.D.     1. COPD with exacerbation     Albuterol 5/ atrovent 0.5 neb given.  Prednisone 60 mg given.  No relief of wheezing.  Albuterol 10 mg over one hour given.  Still with wheezing,  No significant improvement.  Patient sats 88-90% when Paint o2 dc'd.    MDM   Spoke to Dr. Estell Harpin who also evaluated pt.  Pt to be admitted.  Spoke with Dr. Lendell Caprice who will see pt in ed.       Candis Musa, PA 11/04/11 1626   Dr. Lendell Caprice to see pt,  But patient has decided to go home.  States she doesn't get any sleepy when in the hospital and concerned about being away from her husband.  She was advised about the possible risks including worse breathing/  Respiratory distress,  Severe deterioration including need for ventilator,  Possible death.  Strongly encouraged to see pcp in am for a recheck.  Also encouraged to stop  smoking.  Husband at bedside,  Agreed with plan to go home.  Candis Musa, PA 11/04/11 1733

## 2011-11-04 NOTE — ED Notes (Signed)
Pt states has had increased cough/congestion in chest since Sunday. Productive cough of yellow sputum. Pt denies fever.

## 2011-11-04 NOTE — Telephone Encounter (Signed)
Patient called and stated her face was swollen and she was wheezing very bad in the background with a bad cough. Advised ER. She did not want to go and kept asking to be worked in here. Was told Dr Jeanice Lim was out today and dr simpson was full. She then agreed to go to ER

## 2011-11-05 ENCOUNTER — Ambulatory Visit (INDEPENDENT_AMBULATORY_CARE_PROVIDER_SITE_OTHER): Payer: Self-pay | Admitting: Family Medicine

## 2011-11-05 ENCOUNTER — Encounter: Payer: Self-pay | Admitting: Family Medicine

## 2011-11-05 ENCOUNTER — Telehealth: Payer: Self-pay | Admitting: Family Medicine

## 2011-11-05 VITALS — BP 118/80 | HR 17 | Resp 17 | Ht 63.0 in | Wt 136.0 lb

## 2011-11-05 DIAGNOSIS — J441 Chronic obstructive pulmonary disease with (acute) exacerbation: Secondary | ICD-10-CM

## 2011-11-05 DIAGNOSIS — F17200 Nicotine dependence, unspecified, uncomplicated: Secondary | ICD-10-CM

## 2011-11-05 DIAGNOSIS — Z720 Tobacco use: Secondary | ICD-10-CM

## 2011-11-05 MED ORDER — DOXYCYCLINE HYCLATE 100 MG PO TABS: 100.0000 mg | ORAL_TABLET | Freq: Two times a day (BID) | ORAL | Status: AC

## 2011-11-05 MED ORDER — DOXYCYCLINE HYCLATE 100 MG PO TABS: 100.0000 mg | ORAL_TABLET | Freq: Two times a day (BID) | ORAL | Status: DC

## 2011-11-05 NOTE — Progress Notes (Signed)
  Subjective:    Patient ID: Cindy [name], female    DOB: Jan 13, 1956, 54 y.o.   MRN: [id number]  HPI Pt  Has  COPD , she  started smoking around age 5 , still smokes , severe symptoms developed 3 days ago, increased shortness of breath with wheezing. Seen in the ed yesterday after prolonged coercing by office staff of the need to go, she accepted treatment there, but refused admission . Very emotionally and mentally stressed about lack of funds, insurance coverage , and ability to afford health care needed. Still smokes daily, and unwilling to set quit date at this time.   Review of Systems See HPI Denies recent fever or chills. Denies sinus pressure, nasal congestion, ear pain or sore throat. C/o chest congestion, productive cough , sputum is yellow, she is also  wheezing. Denies chest pains, palpitations and leg swelling Denies abdominal pain, nausea, vomiting,diarrhea or constipation.   Denies dysuria, frequency, hesitancy or incontinence. Denies joint pain, swelling and limitation in mobility.  C/o depression and anxiety . Sleep is disturbed since she has been sick with increased wheezing and cough Denies skin break down or rash.        Objective:   Physical Exam Patient alert and oriented and in mild  cardiopulmonary distress.  HEENT: No facial asymmetry, EOMI, no sinus tenderness,  oropharynx pink and moist.  Neck supple no adenopathy.  Chest: decreased air entry, scattered crackles and wheezing CVS: S1, S2 no murmurs, no S3.  ABD: Soft non tender. Bowel sounds normal.  Ext: No edema  Psych: Good eye contact, anxious and  depressed .  CNS: CN 2-12 intact, power, tone and sensation normal throughout.        Assessment & Plan:

## 2011-11-05 NOTE — Telephone Encounter (Signed)
pls work in this pt

## 2011-11-05 NOTE — Telephone Encounter (Signed)
Work in

## 2011-11-05 NOTE — Telephone Encounter (Signed)
Pt was called and is on her way to see dr. Lodema Hong.  Per Dr. Lodema Hong request

## 2011-11-05 NOTE — Telephone Encounter (Signed)
Work in pt she did leave ama with wheezing, should have been admitted

## 2011-11-05 NOTE — Patient Instructions (Addendum)
F/u with Dr Jeanice Lim in 3 months.  Please go directly to the emergency room if you have increased difficulty breathing, I agree that you should have stayed for admission yesterday as recommended  Antibiotic is prescribed for 10 days .  You need to stop smoking

## 2011-11-07 ENCOUNTER — Ambulatory Visit: Payer: Self-pay | Admitting: Family Medicine

## 2011-11-07 NOTE — ED Provider Notes (Signed)
Medical screening examination/treatment/procedure(s) were performed by non-physician practitioner and as supervising physician I was immediately available for consultation/collaboration.   Jessia Kief L Collin Hendley, MD 11/07/11 0736 

## 2011-11-22 ENCOUNTER — Encounter: Payer: Self-pay | Admitting: Family Medicine

## 2011-11-22 NOTE — Assessment & Plan Note (Signed)
Acute exacerbation requiring admission , which was refused 1 day ago, no improvement at this time. t very emotionally distressed about lack of resources for her health care , which is understandable. She is advised to focus on her health and not those circumstances at  This time however

## 2011-11-22 NOTE — Assessment & Plan Note (Signed)
Ongoing nicotine use, counseled to quit

## 2011-12-01 ENCOUNTER — Encounter: Payer: Self-pay | Admitting: Family Medicine

## 2011-12-02 ENCOUNTER — Ambulatory Visit (INDEPENDENT_AMBULATORY_CARE_PROVIDER_SITE_OTHER): Payer: Self-pay | Admitting: Family Medicine

## 2011-12-02 ENCOUNTER — Encounter: Payer: Self-pay | Admitting: Family Medicine

## 2011-12-02 VITALS — BP 112/68 | HR 96 | Resp 18 | Ht 63.0 in | Wt 138.0 lb

## 2011-12-02 DIAGNOSIS — Z720 Tobacco use: Secondary | ICD-10-CM

## 2011-12-02 DIAGNOSIS — J4489 Other specified chronic obstructive pulmonary disease: Secondary | ICD-10-CM

## 2011-12-02 DIAGNOSIS — F17200 Nicotine dependence, unspecified, uncomplicated: Secondary | ICD-10-CM

## 2011-12-02 DIAGNOSIS — J449 Chronic obstructive pulmonary disease, unspecified: Secondary | ICD-10-CM

## 2011-12-02 DIAGNOSIS — J441 Chronic obstructive pulmonary disease with (acute) exacerbation: Secondary | ICD-10-CM

## 2011-12-02 DIAGNOSIS — R21 Rash and other nonspecific skin eruption: Secondary | ICD-10-CM

## 2011-12-02 DIAGNOSIS — J029 Acute pharyngitis, unspecified: Secondary | ICD-10-CM

## 2011-12-02 NOTE — Patient Instructions (Addendum)
F/u with Dr Jeanice Lim, as before.Please verify appointment  You are being treatd fior cOPD fl;are with prednisone dose pack and penicillin  You need to stop smoking.  The area on your left forearm is from a burst blood vessel, no need for concern at this time

## 2011-12-02 NOTE — Progress Notes (Signed)
  Subjective:    Patient ID: Cindy [name], female    DOB: [birth year], 55 y.o.   MRN: [id number]  HPI C/o red spot on left forearm since yesterday, no trauma , no pain. Increased cough and wheezing.Had sore throat yesterday C/o swelling of knees, fingers, left side of face. Continues to smoke with no quit date   Review of Systems See HPI Denies recent fever or chills. Denies sinus pressure, nasal congestion, ear pain . Denies chest pains, palpitations and leg swelling Denies abdominal pain, nausea, vomiting,diarrhea or constipation.   Denies dysuria, frequency, hesitancy or incontinence. Denies joint pain, swelling and limitation in mobility. Denies headaches, seizures, numbness, or tingling. Denies uncontrolled  Depression or  anxiety        Objective:   Physical Exam Patient alert and oriented and in no cardiopulmonary distress.Anxious  HEENT: No facial asymmetry, EOMI, no sinus tenderness,  oropharynx perythematous and  moist.  Neck supple no adenopathy.  Chest: decreased air entry, scattered wheezes, few crackles  CVS: S1, S2 no murmurs, no S3.  ABD: Soft non tender. Bowel sounds normal.  Ext: No edema  MS: Adequate ROM spine, shoulders, hips and knees.  Skin: Intact, capillary bruise on forearm in area of concern Psych: Good eye contact, normal affect.  anxious not depressed appearing.        Assessment & Plan:

## 2011-12-07 NOTE — Assessment & Plan Note (Signed)
Pt reassured that area of concern on her forearm is of no great significance from a pathologic standpoint, represents a bruised blood vesel

## 2011-12-07 NOTE — Assessment & Plan Note (Signed)
Unchanged, counseled to quit 

## 2011-12-07 NOTE — Assessment & Plan Note (Signed)
Acute exacerbation reports she will benefit from prednisone and requests same

## 2012-01-30 LAB — PULMONARY FUNCTION TEST

## 2012-02-23 ENCOUNTER — Other Ambulatory Visit: Payer: Self-pay | Admitting: Family Medicine

## 2012-02-23 ENCOUNTER — Telehealth: Payer: Self-pay | Admitting: Family Medicine

## 2012-02-23 ENCOUNTER — Ambulatory Visit (HOSPITAL_COMMUNITY)
Admission: RE | Admit: 2012-02-23 | Discharge: 2012-02-23 | Disposition: A | Discharge status: Home / Self Care | Payer: Medicaid Other | Source: Ambulatory Visit | Attending: Family Medicine | Admitting: Family Medicine

## 2012-02-23 ENCOUNTER — Ambulatory Visit (INDEPENDENT_AMBULATORY_CARE_PROVIDER_SITE_OTHER): Payer: Medicaid Other | Admitting: Family Medicine

## 2012-02-23 ENCOUNTER — Encounter: Payer: Self-pay | Admitting: Family Medicine

## 2012-02-23 VITALS — BP 120/80 | HR 101 | Temp 97.9°F | Resp 18 | Ht 63.0 in | Wt 138.0 lb

## 2012-02-23 DIAGNOSIS — T50905A Adverse effect of unspecified drugs, medicaments and biological substances, initial encounter: Secondary | ICD-10-CM

## 2012-02-23 DIAGNOSIS — M25441 Effusion, right hand: Secondary | ICD-10-CM

## 2012-02-23 DIAGNOSIS — Z13220 Encounter for screening for lipoid disorders: Secondary | ICD-10-CM

## 2012-02-23 DIAGNOSIS — M79609 Pain in unspecified limb: Secondary | ICD-10-CM | POA: Insufficient documentation

## 2012-02-23 DIAGNOSIS — J441 Chronic obstructive pulmonary disease with (acute) exacerbation: Secondary | ICD-10-CM

## 2012-02-23 DIAGNOSIS — R0602 Shortness of breath: Secondary | ICD-10-CM | POA: Insufficient documentation

## 2012-02-23 DIAGNOSIS — J4489 Other specified chronic obstructive pulmonary disease: Secondary | ICD-10-CM | POA: Insufficient documentation

## 2012-02-23 DIAGNOSIS — T50904A Poisoning by unspecified drugs, medicaments and biological substances, undetermined, initial encounter: Secondary | ICD-10-CM

## 2012-02-23 DIAGNOSIS — Z720 Tobacco use: Secondary | ICD-10-CM

## 2012-02-23 DIAGNOSIS — R7309 Other abnormal glucose: Secondary | ICD-10-CM

## 2012-02-23 DIAGNOSIS — J449 Chronic obstructive pulmonary disease, unspecified: Secondary | ICD-10-CM | POA: Insufficient documentation

## 2012-02-23 DIAGNOSIS — F17200 Nicotine dependence, unspecified, uncomplicated: Secondary | ICD-10-CM

## 2012-02-23 DIAGNOSIS — M7989 Other specified soft tissue disorders: Secondary | ICD-10-CM | POA: Insufficient documentation

## 2012-02-23 DIAGNOSIS — M25449 Effusion, unspecified hand: Secondary | ICD-10-CM

## 2012-02-23 DIAGNOSIS — R739 Hyperglycemia, unspecified: Secondary | ICD-10-CM

## 2012-02-23 DIAGNOSIS — Z780 Asymptomatic menopausal state: Secondary | ICD-10-CM

## 2012-02-23 DIAGNOSIS — R609 Edema, unspecified: Secondary | ICD-10-CM

## 2012-02-23 MED ORDER — SODIUM CHLORIDE 0.9 % IV SOLN
125.0000 mg | Freq: Once | INTRAVENOUS | Status: AC
  Administered 2012-02-23: 130 mg via INTRAMUSCULAR

## 2012-02-23 MED ORDER — GUAIFENESIN ER 600 MG PO TB12: 600.0000 mg | ORAL_TABLET | Freq: Two times a day (BID) | ORAL | Status: DC

## 2012-02-23 MED ORDER — PREDNISONE 10 MG PO TABS: ORAL_TABLET | ORAL | Status: DC

## 2012-02-23 MED ORDER — DOXYCYCLINE HYCLATE 100 MG PO TABS: 100.0000 mg | ORAL_TABLET | Freq: Two times a day (BID) | ORAL | Status: DC

## 2012-02-23 MED ORDER — IPRATROPIUM BROMIDE 0.02 % IN SOLN
0.5000 mg | Freq: Once | RESPIRATORY_TRACT | Status: AC
  Administered 2012-02-23: 0.5 mg via RESPIRATORY_TRACT

## 2012-02-23 MED ORDER — ALBUTEROL SULFATE (5 MG/ML) 0.5% IN NEBU
2.5000 mg | INHALATION_SOLUTION | Freq: Once | RESPIRATORY_TRACT | Status: AC
  Administered 2012-02-23: 2.5 mg via RESPIRATORY_TRACT

## 2012-02-23 NOTE — Progress Notes (Signed)
  Subjective:    Patient ID: Cindy [name], female    DOB: 04/13/1956, 56 y.o.   MRN: [id number]  HPI Patient presents with COPD exacerbation. She's been coughing and wheezing for the past week. She's been using her Combivent and Spiriva and Symbicort as prescribed. She continues to smoke a pack will last her 2 days. She has not had any recent trips to the emergency room. She feels very white and the face but her cough gets really bad. She's also complaining of right ear pain and nasal drainage.  Hand pain- patient is noted swelling of her right thumb and first finger. She initially noticed this after November admission to the hospital. She states her hand is very sore and stiff.   Review of Systems - per above  GEN- denies fatigue, fever, weight loss,weakness, recent illness HEENT- denies eye drainage, change in vision, +nasal discharge, CVS- denies chest pain, palpitations RESP- +SOB,+ cough, +wheeze MSK- + joint pain, muscle aches, injury       Objective:   Physical Exam  Walking oxygen sat 92% GEN- Pulmonary distress, audible wheezing, alert and oriented HEENT- EOMI, Oropharynx clear, MMM, TM Clear bilat Neck-supraclavicular retractions  CVS-Tachycardic, no murmur RESP- bilateral expiratory and inspirtaory wheeze/bronchospasm, increased WOB, +rhonchi, harsh cough,  EXT- Hand + fnklestein, +swelling along PIP of thumb, TTP, able to make a fist    Neb treatment given, improved WOB     Assessment & Plan:

## 2012-02-23 NOTE — Patient Instructions (Addendum)
Take the antibiotics as prescribed Get the x-ray of your chest and hand Take the steroids as prescribed Please schedule physical exam Please call us with the name of the urgent care so I can get the records from your pulmonary function testing F/u on Wed for your PAP Smear Get the blood work done- fasting do not eat after midnight You can take a Women's One a Day

## 2012-02-23 NOTE — Telephone Encounter (Signed)
Pt is aware and will be scheduled.

## 2012-02-23 NOTE — Telephone Encounter (Signed)
Pt needs to come

## 2012-02-24 ENCOUNTER — Telehealth: Payer: Self-pay | Admitting: Family Medicine

## 2012-02-24 LAB — LIPID PANEL
Cholesterol: 247 mg/dL — ABNORMAL HIGH (ref 0–200)
Total CHOL/HDL Ratio: 4.8 Ratio

## 2012-02-24 LAB — BASIC METABOLIC PANEL
BUN: 11 mg/dL (ref 6–23)
CO2: 28 mEq/L (ref 19–32)
Calcium: 9.8 mg/dL (ref 8.4–10.5)
Glucose, Bld: 86 mg/dL (ref 70–99)
Sodium: 139 mEq/L (ref 135–145)

## 2012-02-24 NOTE — Telephone Encounter (Signed)
Please get the records from the [facility] center below, I need her PFT's  She has them done for her disability evaluation

## 2012-02-24 NOTE — Assessment & Plan Note (Signed)
Patient counseled to quit smoking

## 2012-02-24 NOTE — Telephone Encounter (Signed)
Request for info faxed to [facility].

## 2012-02-24 NOTE — Assessment & Plan Note (Signed)
She appears to have a tenosynovitis. She may have some underlying arthritis as well. Will obtain x-ray of the hand. We'll use prednisone for right now as she is on this for her COPD

## 2012-02-24 NOTE — Assessment & Plan Note (Addendum)
Solu-Medrol given, prednisone taper and doxycycline. Patient is to use her inhalers. I will consider adding Daliresp to her regimen I will obtain her PFT records from the occupational health physician Chest x-ray to be obtained with recurrent exacerbations

## 2012-02-25 ENCOUNTER — Encounter: Payer: Self-pay | Admitting: Family Medicine

## 2012-02-25 ENCOUNTER — Telehealth: Payer: Self-pay | Admitting: Family Medicine

## 2012-02-25 ENCOUNTER — Other Ambulatory Visit: Payer: Self-pay | Admitting: Family Medicine

## 2012-02-25 ENCOUNTER — Ambulatory Visit (INDEPENDENT_AMBULATORY_CARE_PROVIDER_SITE_OTHER): Payer: Medicaid Other | Admitting: Family Medicine

## 2012-02-25 ENCOUNTER — Other Ambulatory Visit (HOSPITAL_COMMUNITY)
Admission: RE | Admit: 2012-02-25 | Discharge: 2012-02-25 | Disposition: A | Discharge status: Home / Self Care | Payer: Medicaid Other | Source: Ambulatory Visit | Attending: Family Medicine | Admitting: Family Medicine

## 2012-02-25 VITALS — BP 126/84 | HR 68 | Resp 20 | Ht 63.0 in | Wt 138.1 lb

## 2012-02-25 DIAGNOSIS — M25441 Effusion, right hand: Secondary | ICD-10-CM

## 2012-02-25 DIAGNOSIS — E785 Hyperlipidemia, unspecified: Secondary | ICD-10-CM

## 2012-02-25 DIAGNOSIS — E782 Mixed hyperlipidemia: Secondary | ICD-10-CM | POA: Insufficient documentation

## 2012-02-25 DIAGNOSIS — Z0000 Encounter for general adult medical examination without abnormal findings: Secondary | ICD-10-CM

## 2012-02-25 DIAGNOSIS — Z139 Encounter for screening, unspecified: Secondary | ICD-10-CM

## 2012-02-25 DIAGNOSIS — R739 Hyperglycemia, unspecified: Secondary | ICD-10-CM

## 2012-02-25 DIAGNOSIS — T50905A Adverse effect of unspecified drugs, medicaments and biological substances, initial encounter: Secondary | ICD-10-CM

## 2012-02-25 DIAGNOSIS — J441 Chronic obstructive pulmonary disease with (acute) exacerbation: Secondary | ICD-10-CM

## 2012-02-25 DIAGNOSIS — M25449 Effusion, unspecified hand: Secondary | ICD-10-CM

## 2012-02-25 DIAGNOSIS — Z124 Encounter for screening for malignant neoplasm of cervix: Secondary | ICD-10-CM

## 2012-02-25 DIAGNOSIS — Z720 Tobacco use: Secondary | ICD-10-CM

## 2012-02-25 DIAGNOSIS — R7309 Other abnormal glucose: Secondary | ICD-10-CM

## 2012-02-25 DIAGNOSIS — Z01419 Encounter for gynecological examination (general) (routine) without abnormal findings: Secondary | ICD-10-CM

## 2012-02-25 DIAGNOSIS — F17200 Nicotine dependence, unspecified, uncomplicated: Secondary | ICD-10-CM

## 2012-02-25 MED ORDER — ALBUTEROL SULFATE HFA 108 (90 BASE) MCG/ACT IN AERS: 2.0000 | INHALATION_SPRAY | RESPIRATORY_TRACT | Status: DC | PRN

## 2012-02-25 MED ORDER — NAPROXEN 500 MG PO TABS: 500.0000 mg | ORAL_TABLET | Freq: Two times a day (BID) | ORAL | Status: DC

## 2012-02-25 MED ORDER — SIMVASTATIN 20 MG PO TABS: 20.0000 mg | ORAL_TABLET | Freq: Every evening | ORAL | Status: DC

## 2012-02-25 NOTE — Assessment & Plan Note (Signed)
A1c is improved. Patient does not need any medications at this time

## 2012-02-25 NOTE — Progress Notes (Signed)
  Subjective:    Patient ID: Cindy [name], female    DOB: [birth year], 56 y.o.   MRN: [id number]  HPI Patient here for GYN visit in a 24-hour followup on COPD  GYN history of abnormal Pap smears has had colposcopy with biopsy in the past which returned normal. She is overdue for mammogram and needs a bone density scan.  COPD-patient seen Monday for COPD exacerbation. She has improved with her prednisone and antibiotics. She currently has Symbicort and Combivent at home. She is awaiting Spiriva from the health department. She continues to smoke. She feels her oxygen level is dropping a lot at nighttime. She would like to switch to Mucinex DM. She does state that prednisone causes her to become red in the face and causes blotches on the skin. She has had some headache but this has improved as her breathing improved Hand pain-x-rays reviewed showed some osteopenia and a tenosynovitis. Prednisone did not help with the swelling or pain.  Labs reviewed   Review of Systems GEN- denies fatigue, fever, weight loss,weakness, +recent illness HEENT- denies eye drainage, change in vision, nasal discharge, CVS- denies chest pain, palpitations RESP-+SOB, +cough, +wheeze ABD- denies N/V, change in stools, abd pain GU- denies dysuria, hematuria, dribbling,Occ leaking with cough or sneeze, denies incontinence MSK- + joint pain, muscle aches, injury Neuro- + headache, dizziness, syncope, seizure activity       Objective:   Physical Exam GEN-NAD, alert and oriented, speaking in full sentencesHEENT- EOMI, Oropharynx clear, MMM, TM Clear bilat Neck-no retractions CVS-RRR, no murmur RESP- bilateral expiratory and inspirtaory wheeze/(improved), mildly increased WOB RR 20, +rhonchi, fair air movement. Breast- normal symmetry, no nipple inversion,no nipple drainage, no nodules or lumps felt Nodes- no axillary nodes GU- normal external genitalia, vaginal mucosa pink and moist, cervix visualized no growth,  bladder prolapsed some into view, no blood form os, minimal thin clear discharge, no CMT, no ovarian masses, uterus normal size Ext- no edema,  Pulses 2+  FOBT- negative     Assessment & Plan:  GYN physical- Pap smear sent. Patient will be set up for mammogram and bone density test . She will need colon cancer screening as well

## 2012-02-25 NOTE — Assessment & Plan Note (Signed)
Counseled to quit again 

## 2012-02-25 NOTE — Assessment & Plan Note (Signed)
Start zocor, discussed limiting lard and cooking oils

## 2012-02-25 NOTE — Assessment & Plan Note (Signed)
Will start naprosyn for tenosynovitis noted and likley underlying arthritis

## 2012-02-25 NOTE — Patient Instructions (Addendum)
Continue your current medications Please set up for a Mammogram and Bone Density Start the cholesterol medication at bedtime I will send a monitor to your house to check your nighttime oxygen levels Take the naprosyn for your hand pain  For your inhalers- Take the comibivent until you use this up, continue the symbicort,. When your spiriva comes in, stop the combivent and use the Pro-air every 4 hours as needed  F/U 3 months for COPD

## 2012-02-25 NOTE — Assessment & Plan Note (Signed)
Patient is improving on current regimen. We'll have her discontinue Combivent when she gets her Spiriva we went over th these instructions many times. She will be changed to ProAir along with Spiriva and Symbicort. Hopefully if she is able to stay on her inhalers her COPD exacerbations will lessen

## 2012-02-26 ENCOUNTER — Ambulatory Visit: Payer: Self-pay | Admitting: Family Medicine

## 2012-02-26 LAB — VITAMIN D 1,25 DIHYDROXY
Vitamin D 1, 25 (OH)2 Total: 74 pg/mL — ABNORMAL HIGH (ref 18–72)
Vitamin D2 1, 25 (OH)2: <8 pg/mL
Vitamin D3 1, 25 (OH)2: 74 pg/mL

## 2012-02-26 MED ORDER — ALBUTEROL SULFATE HFA 108 (90 BASE) MCG/ACT IN AERS: 2.0000 | INHALATION_SPRAY | RESPIRATORY_TRACT | Status: DC | PRN

## 2012-02-26 MED ORDER — SIMVASTATIN 20 MG PO TABS: 20.0000 mg | ORAL_TABLET | Freq: Every evening | ORAL | Status: DC

## 2012-02-26 NOTE — Telephone Encounter (Signed)
Pt aware.

## 2012-02-26 NOTE — Telephone Encounter (Signed)
I spoke with [name], pt has Medicaid which covers meds until March 31st, she will have to get her Pro-Air and statin from [facility], if her Medicaid is not recertified she can then start getting her meds from the health dept again. Note Health dept carries lipitor not zocor

## 2012-02-27 ENCOUNTER — Telehealth: Payer: Self-pay | Admitting: Family Medicine

## 2012-02-27 DIAGNOSIS — R0902 Hypoxemia: Secondary | ICD-10-CM

## 2012-02-27 DIAGNOSIS — J9611 Chronic respiratory failure with hypoxia: Secondary | ICD-10-CM | POA: Insufficient documentation

## 2012-02-27 NOTE — Telephone Encounter (Signed)
She is still swelling and red from the prednisone, she will start to taper off the prednisone -she took 20mg  today  tomorrow she will take Sat take 20mg , Sunday 10mg   and Monday 10mg .  I discussed her night time pulse oximetry. She will start bedtime oxygen at 2 L, order sent to [employer]

## 2012-03-01 ENCOUNTER — Telehealth: Payer: Self-pay

## 2012-03-01 NOTE — Telephone Encounter (Signed)
I spoke with pt, she now thinks it is the antibiotic after taking it Sat, which was the 6th dose, she began to have redness that spread to her chest and white spots in her mouth. But then she began to state her anti-inflammatory was causing problems as well, as well as the prednisone which we discussed before She will come in the morning for a 9 o clock appt. She is to stop all new medications prescribed   Please put her in the 9 oclock spot for the morning

## 2012-03-02 ENCOUNTER — Ambulatory Visit (INDEPENDENT_AMBULATORY_CARE_PROVIDER_SITE_OTHER): Payer: Medicaid Other | Admitting: Family Medicine

## 2012-03-02 ENCOUNTER — Encounter: Payer: Self-pay | Admitting: Family Medicine

## 2012-03-02 VITALS — BP 110/74 | HR 68 | Resp 16 | Ht 63.0 in | Wt 139.0 lb

## 2012-03-02 DIAGNOSIS — Z720 Tobacco use: Secondary | ICD-10-CM

## 2012-03-02 DIAGNOSIS — M7989 Other specified soft tissue disorders: Secondary | ICD-10-CM

## 2012-03-02 DIAGNOSIS — J441 Chronic obstructive pulmonary disease with (acute) exacerbation: Secondary | ICD-10-CM

## 2012-03-02 DIAGNOSIS — F17200 Nicotine dependence, unspecified, uncomplicated: Secondary | ICD-10-CM

## 2012-03-02 DIAGNOSIS — Z888 Allergy status to other drugs, medicaments and biological substances status: Secondary | ICD-10-CM

## 2012-03-02 DIAGNOSIS — T7840XA Allergy, unspecified, initial encounter: Secondary | ICD-10-CM

## 2012-03-02 DIAGNOSIS — B370 Candidal stomatitis: Secondary | ICD-10-CM

## 2012-03-02 MED ORDER — FUROSEMIDE 20 MG PO TABS: 20.0000 mg | ORAL_TABLET | Freq: Every day | ORAL | Status: DC

## 2012-03-02 MED ORDER — NYSTATIN 100000 UNIT/ML MT SUSP: 500000.0000 [IU] | Freq: Four times a day (QID) | OROMUCOSAL | Status: DC

## 2012-03-02 NOTE — Assessment & Plan Note (Signed)
Nystatin swish and swallow

## 2012-03-02 NOTE — Assessment & Plan Note (Signed)
Possible related to allergic reaction, though may be a result of combination of steroid and NSAID. Given a few days of lasix, will stop all new medications, pt has tapered off of prednisone and recheck in 2 weeks

## 2012-03-02 NOTE — Patient Instructions (Addendum)
For the thrush use the nystatin swish and swallow For your leg swelling,take the pills as prescribed Continue your inhalers Take the benadryl three times a day as needed ( 2 teaspoon) Do not take the naprosyn right now Continue oxygen Stop the combivent, use the albuterol every 4 hours as needed,  Take the spiriva, symbicort  F/U in 2 weeks

## 2012-03-02 NOTE — Assessment & Plan Note (Signed)
unchanged

## 2012-03-02 NOTE — Progress Notes (Signed)
  Subjective:    Patient ID: Cindy [name], female    DOB: Sep 14, 1956, 56 y.o.   MRN: [id number]  HPI Rash- intially pt thought redness on face and headache with mild puffiness in hands was due to prednisone stating it has happened before, then over the weekend she noticed increase in redness extending down to neck associated with HA after taking doxycyline and stopped medication. She started benadryl and swelling and redness has improved a lot  Leg edema- over past few days has had swelling of feet, she thinks is due to either doxy or her new anti-inflammatory she took for 3 days  White exudate in mouth for past 2 days, painful swallowing   Review of Systems- per above   GEN- denies fatigue, fever, weight loss,weakness, +recent illness HEENT- denies eye drainage, change in vision, nasal discharge, CVS- denies chest pain, palpitations RESP-+SOB, +cough, +wheeze ABD- denies N/V, change in stools, abd pain MSK- + joint pain, muscle aches, injury Neuro- + headache, dizziness, syncope, seizure activity    Objective:   Physical Exam GEN-NAD, alert and oriented x3 HEENT- EOMI, Oropharynx white exudate on hard palate, buccal mucosa, tongue-  Easily scraped, MMM, no ulcers seen CVS-RRR, no murmur RESP- scattered bilateral expiratory and inspirtaory wheeze/(improved), normal WOB  No rhonchi, fair air movement. Ext- 1+ edema,  Pulses 2+ Skin- mild erythema in malar region of face extending to neck, no pustules. No hives       Assessment & Plan:

## 2012-03-02 NOTE — Assessment & Plan Note (Signed)
Pts history was very confusing, will place doxycycline on allergy list, continue benadryl prn

## 2012-03-02 NOTE — Assessment & Plan Note (Signed)
Improved, home oxygen at bedtime

## 2012-03-04 ENCOUNTER — Encounter: Payer: Self-pay | Admitting: Family Medicine

## 2012-03-04 ENCOUNTER — Ambulatory Visit (HOSPITAL_COMMUNITY)
Admission: RE | Admit: 2012-03-04 | Discharge: 2012-03-04 | Disposition: A | Discharge status: Home / Self Care | Payer: Medicaid Other | Source: Ambulatory Visit | Attending: Family Medicine | Admitting: Family Medicine

## 2012-03-04 DIAGNOSIS — Z139 Encounter for screening, unspecified: Secondary | ICD-10-CM

## 2012-03-04 DIAGNOSIS — M810 Age-related osteoporosis without current pathological fracture: Secondary | ICD-10-CM | POA: Insufficient documentation

## 2012-03-04 DIAGNOSIS — Z1231 Encounter for screening mammogram for malignant neoplasm of breast: Secondary | ICD-10-CM | POA: Insufficient documentation

## 2012-03-05 ENCOUNTER — Telehealth: Payer: Self-pay | Admitting: Family Medicine

## 2012-03-05 NOTE — Telephone Encounter (Signed)
I think she assuming you were going to start her on some meds for her bones?

## 2012-03-05 NOTE — Telephone Encounter (Signed)
We will discuss at her next visit, I want to make sure she recovers from the allergic reaction first, but yes she will need medication for her bones

## 2012-03-16 ENCOUNTER — Ambulatory Visit (INDEPENDENT_AMBULATORY_CARE_PROVIDER_SITE_OTHER): Payer: Medicaid Other | Admitting: Family Medicine

## 2012-03-16 ENCOUNTER — Encounter: Payer: Self-pay | Admitting: Family Medicine

## 2012-03-16 VITALS — BP 120/80 | HR 79 | Resp 17 | Wt 140.0 lb

## 2012-03-16 DIAGNOSIS — R635 Abnormal weight gain: Secondary | ICD-10-CM | POA: Insufficient documentation

## 2012-03-16 DIAGNOSIS — Z720 Tobacco use: Secondary | ICD-10-CM

## 2012-03-16 DIAGNOSIS — M7989 Other specified soft tissue disorders: Secondary | ICD-10-CM

## 2012-03-16 DIAGNOSIS — M810 Age-related osteoporosis without current pathological fracture: Secondary | ICD-10-CM

## 2012-03-16 DIAGNOSIS — M8580 Other specified disorders of bone density and structure, unspecified site: Secondary | ICD-10-CM | POA: Insufficient documentation

## 2012-03-16 DIAGNOSIS — J449 Chronic obstructive pulmonary disease, unspecified: Secondary | ICD-10-CM

## 2012-03-16 DIAGNOSIS — K219 Gastro-esophageal reflux disease without esophagitis: Secondary | ICD-10-CM | POA: Insufficient documentation

## 2012-03-16 DIAGNOSIS — F17200 Nicotine dependence, unspecified, uncomplicated: Secondary | ICD-10-CM

## 2012-03-16 DIAGNOSIS — J4489 Other specified chronic obstructive pulmonary disease: Secondary | ICD-10-CM

## 2012-03-16 DIAGNOSIS — R0902 Hypoxemia: Secondary | ICD-10-CM

## 2012-03-16 MED ORDER — ALENDRONATE SODIUM 70 MG PO TABS: 70.0000 mg | ORAL_TABLET | ORAL | Status: DC

## 2012-03-16 NOTE — Patient Instructions (Addendum)
Do your exercises twice a day  For your breathing- use the ventolin every 4 hours as needed Use your spiriva once a day Symbicort 2 puffs twice a day Albuterol is your rescue inhaler Take the osteoporosis medication once a week- take and sit up for 30 minutes Try Calcium 1200mg  and Vit D 800IU Work on the diet- avoid fried foods, avoid using Crisco  You need to quit smoking! F/U 3 months

## 2012-03-16 NOTE — Assessment & Plan Note (Signed)
Continue to encourage cessation. 

## 2012-03-16 NOTE — Assessment & Plan Note (Signed)
Her weight gain is very minimal and her BMI is still wnl, discussed healthy eating, incorporate low endurance exercises

## 2012-03-16 NOTE — Assessment & Plan Note (Signed)
Much improved with short course of lasix, she is now off NSAIDS and Prednisone, will monitor

## 2012-03-16 NOTE — Assessment & Plan Note (Signed)
Persistent wheeze, but overall asymptomatic, she continues to smoke. She now has her inhalers. Advised smoking cessation Mucinex as needed, she also has a nebulizer machine, if she needs more meds will call

## 2012-03-16 NOTE — Assessment & Plan Note (Signed)
Continue PPI, pt now on bisphosphanate

## 2012-03-16 NOTE — Progress Notes (Signed)
  Subjective:    Patient ID: Cindy [name], female    DOB: 1956-01-28, 56 y.o.   MRN: [id number]  HPI   Patient here to follow COPD. She continues to have cough with some production but overall is much improved. She no longer has a flushing in her face and the thrush has resolved. She is still using her oxygen at bedtime. She continues to smoke.  Leg edema- her swelling went down with the use of Lasix she currently does not have any pain in the legs.  Osteoporosis- aged very concerned about her previous x-ray which showed some bony demineralization approximately 2 years ago.  Weight gain- patient has gained 2 pounds at her previous visit she is very concerned about this and wants her weight back into the 120s her previously. She does do a few exercises however does not have very much endurance secondary to her COPD. We discussed proper diet as she will tend to skip meals. We also discussed importance of fresh fruits and vegetables and avoiding lots of carbohydrates and simple sugars.   Review of Systems     GEN- denies fatigue, fever, weight loss,weakness, +recent illness HEENT- denies eye drainage, change in vision, nasal discharge, CVS- denies chest pain, palpitations RESP-denies SOB, +cough, +wheeze ABD- denies N/V, change in stools, abd pain MSK- OCC  joint pain, muscle aches, injury Neuro- denies headache, dizziness, syncope, seizure activity     Objective:   Physical Exam GEN-NAD, alert and oriented x3 HEENT- EOMI, oropharynx clear, MMM CVS-RRR, no murmur RESP- scattered bilateral expiratory and inspirtaory wheeze/(improved), normal WOB  No rhonchi, fair air movement. Ext- trace pedal edema,            Assessment & Plan:

## 2012-03-16 NOTE — Assessment & Plan Note (Signed)
Will start q weekly fosamax, pt already suffers with GERD

## 2012-03-16 NOTE — Assessment & Plan Note (Signed)
Continue oxygen for nocturnal hypoxia

## 2012-03-17 ENCOUNTER — Telehealth: Payer: Self-pay | Admitting: Family Medicine

## 2012-03-17 NOTE — Telephone Encounter (Signed)
Patient aware that she is supposed to be on a baby aspirin daily

## 2012-03-17 NOTE — Telephone Encounter (Signed)
Advised the lasix was just for 3 days

## 2012-03-29 ENCOUNTER — Telehealth: Payer: Self-pay | Admitting: Family Medicine

## 2012-03-30 ENCOUNTER — Telehealth: Payer: Self-pay | Admitting: Family Medicine

## 2012-03-30 NOTE — Telephone Encounter (Signed)
I have tried to call her twice. Please let her know  1. The fosamax should be taken only once a week. The calcitonin nose spray is not very good to help build up bones which is why we do not use that medication very often. The evista is also not as good as the medication I have prescribed her, also I am not sure her insurance will cover th evista  2. For the cholesterol pill (Simvastatin) try to take it three times a week   3. At this time she is not taking anything for the inflammation by my records because of the illness she had with her COPD and the rash. She can use aleve over the counter twice a day right now. Make she takes with food.

## 2012-03-30 NOTE — Telephone Encounter (Signed)
Forwarded to Dr.

## 2012-03-30 NOTE — Telephone Encounter (Signed)
Sister recommends calcitonin salamon injection    raloxifenel ( evista)  Three part week ezory : astortate, anshydrous natural  cholestrol medicine is making her dizzy she has been reading all the phamplets this all side effects

## 2012-03-31 ENCOUNTER — Telehealth: Payer: Self-pay | Admitting: Family Medicine

## 2012-03-31 NOTE — Telephone Encounter (Signed)
Courtney spoke with patient

## 2012-04-02 ENCOUNTER — Other Ambulatory Visit: Payer: Self-pay

## 2012-04-02 MED ORDER — SIMVASTATIN 20 MG PO TABS: 20.0000 mg | ORAL_TABLET | Freq: Every evening | ORAL | Status: DC

## 2012-04-02 NOTE — Telephone Encounter (Signed)
Med has refills left however it has been refilled again

## 2012-05-06 ENCOUNTER — Encounter: Payer: Self-pay | Admitting: Family Medicine

## 2012-05-06 ENCOUNTER — Ambulatory Visit (INDEPENDENT_AMBULATORY_CARE_PROVIDER_SITE_OTHER): Payer: Medicaid Other | Admitting: Family Medicine

## 2012-05-06 VITALS — BP 116/68 | HR 98 | Temp 98.0°F | Resp 18 | Ht 63.0 in | Wt 139.1 lb

## 2012-05-06 DIAGNOSIS — F17200 Nicotine dependence, unspecified, uncomplicated: Secondary | ICD-10-CM

## 2012-05-06 DIAGNOSIS — Z720 Tobacco use: Secondary | ICD-10-CM

## 2012-05-06 DIAGNOSIS — R0902 Hypoxemia: Secondary | ICD-10-CM

## 2012-05-06 DIAGNOSIS — J441 Chronic obstructive pulmonary disease with (acute) exacerbation: Secondary | ICD-10-CM

## 2012-05-06 DIAGNOSIS — M791 Myalgia, unspecified site: Secondary | ICD-10-CM | POA: Insufficient documentation

## 2012-05-06 DIAGNOSIS — IMO0001 Reserved for inherently not codable concepts without codable children: Secondary | ICD-10-CM

## 2012-05-06 MED ORDER — IPRATROPIUM-ALBUTEROL 18-103 MCG/ACT IN AERO: 2.0000 | INHALATION_SPRAY | Freq: Four times a day (QID) | RESPIRATORY_TRACT | Status: DC | PRN

## 2012-05-06 MED ORDER — PREDNISONE 10 MG PO TABS: ORAL_TABLET | ORAL | Status: DC

## 2012-05-06 MED ORDER — SODIUM CHLORIDE 0.9 % IV SOLN
125.0000 mg | Freq: Once | INTRAVENOUS | Status: AC
  Administered 2012-05-06: 130 mg via INTRAMUSCULAR

## 2012-05-06 MED ORDER — AMOXICILLIN-POT CLAVULANATE 875-125 MG PO TABS: 1.0000 | ORAL_TABLET | Freq: Two times a day (BID) | ORAL | Status: AC

## 2012-05-06 MED ORDER — ALBUTEROL SULFATE (5 MG/ML) 0.5% IN NEBU
2.5000 mg | INHALATION_SOLUTION | Freq: Once | RESPIRATORY_TRACT | Status: AC
  Administered 2012-05-06: 2.5 mg via RESPIRATORY_TRACT

## 2012-05-06 MED ORDER — IPRATROPIUM BROMIDE 0.02 % IN SOLN
0.5000 mg | Freq: Once | RESPIRATORY_TRACT | Status: AC
  Administered 2012-05-06: 0.5 mg via RESPIRATORY_TRACT

## 2012-05-06 NOTE — Assessment & Plan Note (Signed)
Pt to wear oxygen during the day as well with current exacerbation

## 2012-05-06 NOTE — Progress Notes (Signed)
  Subjective:    Patient ID: Cindy [name], female    DOB: 1956-02-09, 56 y.o.   MRN: [id number]  HPI   Breathing worse for the past week, states she visit her nursing home last week and then subsequently came down with a fever of 101F. She took 2 Tylenol which help her to fever. Since then she has been coughing with little more production than normal. She feels sick into her face and feels that she's been very pale. She's been using her albuterol more however feels this does not help and Combivent helps her better. She's taking her Spiriva and Symbicort as directed. She uses her oxygen as directed at bedtime but feels like the air is not coming out correctly. She also admits to muscle aches and pains and some loose stools for the past couple of days. She's been eating well. She is trying to cut back on the cigarette smoking.   Review of Systems  GEN- denies fatigue, fever, weight loss,weakness, +recent illness HEENT- denies eye drainage, change in vision, nasal discharge, CVS- denies chest pain, palpitations RESP- + SOB, +cough, wheeze ABD- denies N/V, +change in stools- loose stools past few days, abd pain GU- denies dysuria, hematuria, dribbling, incontinence MSK- denies joint pain,+ muscle aches, injury Neuro- denies headache, dizziness, syncope, seizure activity       Objective:   Physical Exam GEN- NAD, alert and oriented x3 HEENT- PERRL, EOMI, non injected sclera, pink conjunctiva, MMM, oropharynx clear Neck- Supple, CVS- resting tachycardia no murmur RESP-bialteral scattered wheeze throughout, increase WOB, mild retractions, harsh rhonchi bilat, no rales, speaking in short sentences  ABD-NABS,soft, NT,ND EXT- No edema Pulses- Radial, DP- 2+  S/p neb-improved WOB, bilateral wheeze        Assessment & Plan:

## 2012-05-06 NOTE — Assessment & Plan Note (Signed)
I think this is secondary to viral illness that initiated COPD, will have her hold fosamax for now until symptoms resolved

## 2012-05-06 NOTE — Assessment & Plan Note (Signed)
counseled on cessation 

## 2012-05-06 NOTE — Assessment & Plan Note (Addendum)
Treat with Augmentin, solumedrol and prednisone, allergy to doxy and Avelox  Pt feels combivent works better for her, will hold Spiriva for now and albuterol  Home Oxygen during the day as needed while sick, continue symbicort Recheck on Monday

## 2012-05-06 NOTE — Patient Instructions (Addendum)
Take the Augmentin as prescribed  Start the prednisone tomorrow Use the combivent, do not take the spirva with this. Where your oxygen during the day as well until you feel better F/U Monday for breathing  Do not take the fosamax this week, restart after you feel better You need to quit smoking! Continue to work on this

## 2012-05-10 ENCOUNTER — Ambulatory Visit (INDEPENDENT_AMBULATORY_CARE_PROVIDER_SITE_OTHER): Payer: Medicaid Other | Admitting: Family Medicine

## 2012-05-10 ENCOUNTER — Encounter: Payer: Self-pay | Admitting: Family Medicine

## 2012-05-10 VITALS — BP 128/82 | HR 83 | Resp 16 | Ht 63.0 in | Wt 140.1 lb

## 2012-05-10 DIAGNOSIS — F17200 Nicotine dependence, unspecified, uncomplicated: Secondary | ICD-10-CM

## 2012-05-10 DIAGNOSIS — Z720 Tobacco use: Secondary | ICD-10-CM

## 2012-05-10 DIAGNOSIS — J441 Chronic obstructive pulmonary disease with (acute) exacerbation: Secondary | ICD-10-CM

## 2012-05-10 DIAGNOSIS — R0902 Hypoxemia: Secondary | ICD-10-CM

## 2012-05-10 NOTE — Assessment & Plan Note (Signed)
Breathing improved, complete steroids and antibiotics. Oxygen during the day as needed

## 2012-05-10 NOTE — Assessment & Plan Note (Signed)
Mild hypoxia resolved today

## 2012-05-10 NOTE — Patient Instructions (Signed)
Finish the  Augmentin and prednisone Start the prednisone tomorrow Use the combivent, do not take the spirva with this. Take your symbicort Do not use the albuterol and Spiriva  Where your oxygen during the day as well until you feel better Restart the fosamax on Wed  You need to quit smoking! Continue to work on this Change your appt to Middle of July

## 2012-05-10 NOTE — Assessment & Plan Note (Signed)
unchanged

## 2012-05-10 NOTE — Progress Notes (Signed)
  Subjective:    Patient ID: Cindy [name], female    DOB: [birth year], 56 y.o.   MRN: [id number]  HPI Pt here to f/u COPD, breathing is better, states she still gets flushed with coughing, cough has some production, using combivent and symbicort, still has antibiotics and prednisone.She is using oxygen during the day when needed   Review of Systems   GEN- denies fatigue, fever, weight loss,weakness, +recent illness HEENT- denies eye drainage, change in vision, nasal discharge, CVS- denies chest pain, palpitations RESP- + SOB, +cough, wheeze ABD- denies N/V, diarrhea, abd pain GU- denies dysuria, hematuria, dribbling, incontinence MSK- denies joint pain, muscle aches, injury Neuro- denies headache, dizziness, syncope, seizure activit    Objective:   Physical Exam GEN- NAD, alert and oriented x3 HEENT-  MMM, oropharynx clear Neck- Supple, CVS- RRR no murmur RESP-bialteral scattered wheeze throughout,normal WOB, mild retractions, fair air movements  EXT- No edema Pulses- Radial, DP- 2+       Assessment & Plan:

## 2012-05-26 ENCOUNTER — Ambulatory Visit: Payer: Medicaid Other | Admitting: Family Medicine

## 2012-06-16 ENCOUNTER — Ambulatory Visit: Payer: Medicaid Other | Admitting: Family Medicine

## 2012-06-17 ENCOUNTER — Telehealth: Payer: Self-pay | Admitting: Family Medicine

## 2012-06-18 NOTE — Telephone Encounter (Signed)
I received a message from [facility] and I gave the verbal ok to change the directions on the new combivent inhaler to once daily

## 2012-06-27 ENCOUNTER — Inpatient Hospital Stay (HOSPITAL_COMMUNITY)
Admission: EM | Admit: 2012-06-27 | Discharge: 2012-06-28 | DRG: 190 | Disposition: A | Discharge status: Home / Self Care | Payer: Medicaid Other | Attending: Internal Medicine | Admitting: Internal Medicine

## 2012-06-27 ENCOUNTER — Emergency Department (HOSPITAL_COMMUNITY): Payer: Medicaid Other

## 2012-06-27 ENCOUNTER — Encounter (HOSPITAL_COMMUNITY): Payer: Self-pay | Admitting: Emergency Medicine

## 2012-06-27 DIAGNOSIS — R000 Tachycardia, unspecified: Secondary | ICD-10-CM | POA: Diagnosis present

## 2012-06-27 DIAGNOSIS — Z79899 Other long term (current) drug therapy: Secondary | ICD-10-CM

## 2012-06-27 DIAGNOSIS — R0902 Hypoxemia: Secondary | ICD-10-CM

## 2012-06-27 DIAGNOSIS — T380X5A Adverse effect of glucocorticoids and synthetic analogues, initial encounter: Secondary | ICD-10-CM | POA: Diagnosis present

## 2012-06-27 DIAGNOSIS — R7989 Other specified abnormal findings of blood chemistry: Secondary | ICD-10-CM

## 2012-06-27 DIAGNOSIS — E785 Hyperlipidemia, unspecified: Secondary | ICD-10-CM | POA: Diagnosis present

## 2012-06-27 DIAGNOSIS — D72829 Elevated white blood cell count, unspecified: Secondary | ICD-10-CM | POA: Diagnosis present

## 2012-06-27 DIAGNOSIS — Z9981 Dependence on supplemental oxygen: Secondary | ICD-10-CM

## 2012-06-27 DIAGNOSIS — J9620 Acute and chronic respiratory failure, unspecified whether with hypoxia or hypercapnia: Secondary | ICD-10-CM | POA: Diagnosis present

## 2012-06-27 DIAGNOSIS — R739 Hyperglycemia, unspecified: Secondary | ICD-10-CM | POA: Diagnosis present

## 2012-06-27 DIAGNOSIS — IMO0002 Reserved for concepts with insufficient information to code with codable children: Secondary | ICD-10-CM

## 2012-06-27 DIAGNOSIS — T50905A Adverse effect of unspecified drugs, medicaments and biological substances, initial encounter: Secondary | ICD-10-CM | POA: Diagnosis present

## 2012-06-27 DIAGNOSIS — Z720 Tobacco use: Secondary | ICD-10-CM | POA: Diagnosis present

## 2012-06-27 DIAGNOSIS — I498 Other specified cardiac arrhythmias: Secondary | ICD-10-CM | POA: Diagnosis present

## 2012-06-27 DIAGNOSIS — M810 Age-related osteoporosis without current pathological fracture: Secondary | ICD-10-CM | POA: Diagnosis present

## 2012-06-27 DIAGNOSIS — J9621 Acute and chronic respiratory failure with hypoxia: Secondary | ICD-10-CM | POA: Diagnosis present

## 2012-06-27 DIAGNOSIS — Z7982 Long term (current) use of aspirin: Secondary | ICD-10-CM

## 2012-06-27 DIAGNOSIS — R7309 Other abnormal glucose: Secondary | ICD-10-CM | POA: Diagnosis present

## 2012-06-27 DIAGNOSIS — F17200 Nicotine dependence, unspecified, uncomplicated: Secondary | ICD-10-CM

## 2012-06-27 DIAGNOSIS — J441 Chronic obstructive pulmonary disease with (acute) exacerbation: Secondary | ICD-10-CM

## 2012-06-27 HISTORY — DX: Tobacco use: Z72.0

## 2012-06-27 HISTORY — DX: Chronic respiratory failure, unspecified whether with hypoxia or hypercapnia: J96.10

## 2012-06-27 LAB — BASIC METABOLIC PANEL
CO2: 26 mEq/L (ref 19–32)
Calcium: 9.4 mg/dL (ref 8.4–10.5)
Creatinine, Ser: 0.6 mg/dL (ref 0.50–1.10)
GFR calc Af Amer: >90 mL/min (ref >90)
GFR calc non Af Amer: >90 mL/min (ref >90)
Sodium: 139 mEq/L (ref 135–145)

## 2012-06-27 LAB — DIFFERENTIAL
Basophils Absolute: 0.1 10*3/uL (ref 0.0–0.1)
Basophils Relative: 0 % (ref 0–1)
Lymphocytes Relative: 5 % — ABNORMAL LOW (ref 12–46)
Monocytes Absolute: 1 10*3/uL (ref 0.1–1.0)
Neutro Abs: 20.1 10*3/uL — ABNORMAL HIGH (ref 1.7–7.7)
Neutrophils Relative %: 91 % — ABNORMAL HIGH (ref 43–77)

## 2012-06-27 LAB — HEMOGLOBIN A1C
Hgb A1c MFr Bld: 5.7 % — ABNORMAL HIGH (ref <5.7)
Mean Plasma Glucose: 117 mg/dL — ABNORMAL HIGH (ref <117)

## 2012-06-27 LAB — BLOOD GAS, ARTERIAL
Acid-base deficit: 0.8 mmol/L (ref 0.0–2.0)
Drawn by: 22179
O2 Content: 4 L/min
pCO2 arterial: 46.3 mmHg — ABNORMAL HIGH (ref 35.0–45.0)
pH, Arterial: 7.338 — ABNORMAL LOW (ref 7.350–7.400)
pO2, Arterial: 94.1 mmHg (ref 80.0–100.0)

## 2012-06-27 LAB — CBC
MCHC: 33.2 g/dL (ref 30.0–36.0)
Platelets: 246 10*3/uL (ref 150–400)
RDW: 13.7 % (ref 11.5–15.5)
WBC: 22.1 10*3/uL — ABNORMAL HIGH (ref 4.0–10.5)

## 2012-06-27 LAB — GLUCOSE, CAPILLARY
Glucose-Capillary: 173 mg/dL — ABNORMAL HIGH (ref 70–99)
Glucose-Capillary: 178 mg/dL — ABNORMAL HIGH (ref 70–99)

## 2012-06-27 LAB — MRSA PCR SCREENING: MRSA by PCR: NEGATIVE

## 2012-06-27 LAB — T4, FREE: Free T4: 1.05 ng/dL (ref 0.80–1.80)

## 2012-06-27 LAB — PRO B NATRIURETIC PEPTIDE: Pro B Natriuretic peptide (BNP): 61.8 pg/mL (ref 0–125)

## 2012-06-27 MED ORDER — INSULIN GLARGINE 100 UNIT/ML ~~LOC~~ SOLN
10.0000 [IU] | Freq: Every day | SUBCUTANEOUS | Status: DC
  Administered 2012-06-27: 10 [IU] via SUBCUTANEOUS

## 2012-06-27 MED ORDER — AEROCHAMBER MAX W/MASK MEDIUM MISC
1.0000 | Freq: Once | Status: AC
  Administered 2012-06-27: 1
  Filled 2012-06-27: qty 1

## 2012-06-27 MED ORDER — ONDANSETRON HCL 4 MG PO TABS: 4.0000 mg | ORAL_TABLET | Freq: Four times a day (QID) | ORAL | Status: DC | PRN

## 2012-06-27 MED ORDER — PANTOPRAZOLE SODIUM 40 MG PO TBEC
40.0000 mg | DELAYED_RELEASE_TABLET | Freq: Every day | ORAL | Status: DC
  Administered 2012-06-27 – 2012-06-28 (×2): 40 mg via ORAL
  Filled 2012-06-27 (×2): qty 1

## 2012-06-27 MED ORDER — LEVALBUTEROL HCL 0.63 MG/3ML IN NEBU
0.6300 mg | INHALATION_SOLUTION | RESPIRATORY_TRACT | Status: DC | PRN
  Filled 2012-06-27 (×2): qty 3

## 2012-06-27 MED ORDER — ALPRAZOLAM 0.5 MG PO TABS
0.5000 mg | ORAL_TABLET | Freq: Three times a day (TID) | ORAL | Status: DC | PRN
Start: 2012-06-27 — End: 2012-06-28
  Administered 2012-06-27 – 2012-06-28 (×2): 0.5 mg via ORAL
  Filled 2012-06-27 (×2): qty 1

## 2012-06-27 MED ORDER — HYDROCODONE-ACETAMINOPHEN 5-325 MG PO TABS
2.0000 | ORAL_TABLET | Freq: Once | ORAL | Status: AC
  Administered 2012-06-27: 2 via ORAL
  Filled 2012-06-27: qty 2

## 2012-06-27 MED ORDER — INSULIN ASPART 100 UNIT/ML ~~LOC~~ SOLN
0.0000 [IU] | Freq: Three times a day (TID) | SUBCUTANEOUS | Status: DC
  Administered 2012-06-27: 4 [IU] via SUBCUTANEOUS
  Administered 2012-06-28 (×2): 3 [IU] via SUBCUTANEOUS

## 2012-06-27 MED ORDER — ONDANSETRON HCL 4 MG PO TABS
4.0000 mg | ORAL_TABLET | Freq: Once | ORAL | Status: AC
  Administered 2012-06-27: 4 mg via ORAL
  Filled 2012-06-27: qty 1

## 2012-06-27 MED ORDER — DEXTROSE 5 % IV SOLN
1.0000 g | Freq: Once | INTRAVENOUS | Status: AC
  Administered 2012-06-27: 1 g via INTRAVENOUS
  Filled 2012-06-27: qty 10

## 2012-06-27 MED ORDER — SIMVASTATIN 20 MG PO TABS
20.0000 mg | ORAL_TABLET | Freq: Every evening | ORAL | Status: DC
  Administered 2012-06-27: 20 mg via ORAL
  Filled 2012-06-27: qty 1

## 2012-06-27 MED ORDER — LEVALBUTEROL HCL 1.25 MG/0.5ML IN NEBU
1.2500 mg | INHALATION_SOLUTION | Freq: Once | RESPIRATORY_TRACT | Status: AC
  Administered 2012-06-27: 1.25 mg via RESPIRATORY_TRACT
  Filled 2012-06-27: qty 0.5

## 2012-06-27 MED ORDER — METHYLPREDNISOLONE SODIUM SUCC 125 MG IJ SOLR
80.0000 mg | Freq: Four times a day (QID) | INTRAMUSCULAR | Status: DC
  Administered 2012-06-27 – 2012-06-28 (×3): 80 mg via INTRAVENOUS
  Filled 2012-06-27 (×3): qty 2

## 2012-06-27 MED ORDER — BUDESONIDE-FORMOTEROL FUMARATE 160-4.5 MCG/ACT IN AERO
2.0000 | INHALATION_SPRAY | Freq: Two times a day (BID) | RESPIRATORY_TRACT | Status: DC
  Administered 2012-06-27: 2 via RESPIRATORY_TRACT
  Filled 2012-06-27: qty 6

## 2012-06-27 MED ORDER — ALUM & MAG HYDROXIDE-SIMETH 200-200-20 MG/5ML PO SUSP: 30.0000 mL | Freq: Four times a day (QID) | ORAL | Status: DC | PRN

## 2012-06-27 MED ORDER — DEXTROSE 5 % IV SOLN
1.0000 g | INTRAVENOUS | Status: DC
  Filled 2012-06-27 (×2): qty 10

## 2012-06-27 MED ORDER — GUAIFENESIN-DM 100-10 MG/5ML PO SYRP: 5.0000 mL | ORAL_SOLUTION | ORAL | Status: DC | PRN

## 2012-06-27 MED ORDER — GUAIFENESIN ER 600 MG PO TB12
600.0000 mg | ORAL_TABLET | Freq: Two times a day (BID) | ORAL | Status: DC
  Administered 2012-06-27 – 2012-06-28 (×3): 600 mg via ORAL
  Filled 2012-06-27 (×3): qty 1

## 2012-06-27 MED ORDER — ENOXAPARIN SODIUM 40 MG/0.4ML ~~LOC~~ SOLN
40.0000 mg | SUBCUTANEOUS | Status: DC
  Administered 2012-06-27: 40 mg via SUBCUTANEOUS
  Filled 2012-06-27: qty 0.4

## 2012-06-27 MED ORDER — POTASSIUM CHLORIDE IN NACL 20-0.9 MEQ/L-% IV SOLN
INTRAVENOUS | Status: DC
  Administered 2012-06-27 – 2012-06-28 (×2): via INTRAVENOUS

## 2012-06-27 MED ORDER — ASPIRIN EC 81 MG PO TBEC
81.0000 mg | DELAYED_RELEASE_TABLET | Freq: Every day | ORAL | Status: DC
  Administered 2012-06-27 – 2012-06-28 (×2): 81 mg via ORAL
  Filled 2012-06-27 (×2): qty 1

## 2012-06-27 MED ORDER — IPRATROPIUM BROMIDE 0.02 % IN SOLN
0.5000 mg | Freq: Once | RESPIRATORY_TRACT | Status: AC
  Administered 2012-06-27: 0.5 mg via RESPIRATORY_TRACT
  Filled 2012-06-27: qty 2.5

## 2012-06-27 MED ORDER — DOCUSATE SODIUM 100 MG PO CAPS
100.0000 mg | ORAL_CAPSULE | Freq: Two times a day (BID) | ORAL | Status: DC
  Administered 2012-06-27 – 2012-06-28 (×3): 100 mg via ORAL
  Filled 2012-06-27 (×3): qty 1

## 2012-06-27 MED ORDER — OXYCODONE HCL 5 MG PO TABS
5.0000 mg | ORAL_TABLET | ORAL | Status: DC | PRN
  Administered 2012-06-27 – 2012-06-28 (×2): 5 mg via ORAL
  Filled 2012-06-27 (×2): qty 1

## 2012-06-27 MED ORDER — ALBUTEROL SULFATE HFA 108 (90 BASE) MCG/ACT IN AERS
2.0000 | INHALATION_SPRAY | RESPIRATORY_TRACT | Status: DC
  Administered 2012-06-27: 2 via RESPIRATORY_TRACT
  Filled 2012-06-27: qty 6.7

## 2012-06-27 MED ORDER — ONDANSETRON HCL 4 MG/2ML IJ SOLN: 4.0000 mg | Freq: Four times a day (QID) | INTRAMUSCULAR | Status: DC | PRN

## 2012-06-27 MED ORDER — ADULT MULTIVITAMIN W/MINERALS CH
1.0000 | ORAL_TABLET | Freq: Every day | ORAL | Status: DC
  Administered 2012-06-27 – 2012-06-28 (×2): 1 via ORAL
  Filled 2012-06-27 (×2): qty 1

## 2012-06-27 MED ORDER — ACETAMINOPHEN 650 MG RE SUPP: 650.0000 mg | Freq: Four times a day (QID) | RECTAL | Status: DC | PRN

## 2012-06-27 MED ORDER — NICOTINE 14 MG/24HR TD PT24
14.0000 mg | MEDICATED_PATCH | Freq: Every day | TRANSDERMAL | Status: DC
  Administered 2012-06-27 – 2012-06-28 (×2): 14 mg via TRANSDERMAL
  Filled 2012-06-27 (×2): qty 1

## 2012-06-27 MED ORDER — ALBUTEROL SULFATE (5 MG/ML) 0.5% IN NEBU
2.5000 mg | INHALATION_SOLUTION | Freq: Once | RESPIRATORY_TRACT | Status: AC
  Administered 2012-06-27: 2.5 mg via RESPIRATORY_TRACT
  Filled 2012-06-27: qty 0.5

## 2012-06-27 MED ORDER — CALCIUM CARBONATE-VITAMIN D 500-200 MG-UNIT PO TABS
1.0000 | ORAL_TABLET | Freq: Two times a day (BID) | ORAL | Status: DC
  Administered 2012-06-27 – 2012-06-28 (×2): 1 via ORAL
  Filled 2012-06-27 (×6): qty 1

## 2012-06-27 MED ORDER — IPRATROPIUM BROMIDE 0.02 % IN SOLN
0.5000 mg | RESPIRATORY_TRACT | Status: DC
  Administered 2012-06-27 – 2012-06-28 (×6): 0.5 mg via RESPIRATORY_TRACT
  Filled 2012-06-27 (×6): qty 2.5

## 2012-06-27 MED ORDER — DEXTROSE 5 % IV SOLN
500.0000 mg | INTRAVENOUS | Status: DC
  Filled 2012-06-27: qty 500

## 2012-06-27 MED ORDER — LEVALBUTEROL HCL 0.63 MG/3ML IN NEBU
0.6300 mg | INHALATION_SOLUTION | RESPIRATORY_TRACT | Status: DC
  Administered 2012-06-27 (×3): 0.63 mg via RESPIRATORY_TRACT
  Filled 2012-06-27 (×3): qty 3

## 2012-06-27 MED ORDER — ACETAMINOPHEN 325 MG PO TABS
650.0000 mg | ORAL_TABLET | Freq: Four times a day (QID) | ORAL | Status: DC | PRN
  Administered 2012-06-27: 650 mg via ORAL
  Filled 2012-06-27 (×2): qty 2

## 2012-06-27 MED ORDER — METHYLPREDNISOLONE SODIUM SUCC 125 MG IJ SOLR
125.0000 mg | Freq: Once | INTRAMUSCULAR | Status: AC
  Administered 2012-06-27: 125 mg via INTRAVENOUS
  Filled 2012-06-27: qty 2

## 2012-06-27 MED ORDER — DEXTROSE 5 % IV SOLN: 1.0000 g | Freq: Once | INTRAVENOUS | Status: DC

## 2012-06-27 MED ORDER — AZITHROMYCIN 250 MG PO TABS
500.0000 mg | ORAL_TABLET | Freq: Once | ORAL | Status: AC
  Administered 2012-06-27: 500 mg via ORAL
  Filled 2012-06-27: qty 2

## 2012-06-27 MED ORDER — INSULIN ASPART 100 UNIT/ML ~~LOC~~ SOLN: 0.0000 [IU] | Freq: Every day | SUBCUTANEOUS | Status: DC

## 2012-06-27 NOTE — ED Provider Notes (Signed)
Patient has history of COPD and continues to smoke. She's on 2 L per minute nasal cannula at home. She reports shortness of breath and a lot of dyspnea on exertion. I was asked to see patient by PA who relates she has had a couple of nebulizers and IV steroids and her pulse ox drops into the 80s on her usual 2 L per minute nasal cannula. During the time of my interview her pulse ox was 91-94% while talking on 4 L per minute nasal cannula. Nurses ambulated  patient with her usual 2 L per minute nasal cannula and her pulse ox dropped into his 77% on my exam patient has diffuse high-pitched end expiratory wheezing and diminished breath sounds diffusely.  Medical screening examination/treatment/procedure(s) were conducted as a shared visit with non-physician practitioner(s) and myself.  I personally evaluated the patient during the encounter  Devoria Albe, MD, Franz Dell, MD 06/27/12 1256

## 2012-06-27 NOTE — H&P (Signed)
Cindy [name] MRN: [id number] DOB/AGE: [birth year] 56 y.o. Primary Care Physician:[name], [name], MD Admit date: 06/27/2012 Chief Complaint: Difficulty breathing and shortness of breath. HPI: The patient is a 56 year old woman with a history significant for oxygen-dependent COPD, tobacco abuse, and osteoporosis, who presents to the emergency department today with a chief complaint of difficulty breathing and shortness of breath. She has also had progressive chest congestion and wheezing. She has chronic shortness of breath. However, since last night, she has had worsening shortness of breath, chest congestion, and wheezing. She increased the use of her inhalers which did not help. She continued wearing her oxygen. She has pleuritic chest pain, mostly on the left and in the center of her upper back. She has a nonproductive cough. She has had nausea but no vomiting. She had a low-grade fever of 100.45F last night. She was able to smoke only one cigarette yesterday. She usually smokes approximately one pack of cigarettes per day. She denies orthopnea and worsening of swelling in her legs. She denies headache, abdominal pain, diarrhea, constipation, or pain with urination.  In the emergency department, shortly after her arrival, she was noted to be hypoxic with an oxygen saturation in the 80s on 2 L of oxygen. With ambulation, her oxygen saturation fell to 77% on 2 L of oxygen. When she was placed on 4 L, her oxygen saturation improved to 94%. She is afebrile. Her blood pressure is within normal limits. She is intermittently tachycardic. Her EKG reveals sinus tachycardia with a heart rate of 117 beats per minute. Her chest x-ray reveals changes of COPD and atherosclerosis but no acute cardiopulmonary disease. Her lab data are significant for a venous glucose of 169, normal BNP of 62, and a WBC of 22.1. She is being admitted for further evaluation and management.  Past Medical History  Diagnosis Date  . COPD  (chronic obstructive pulmonary disease)   . Shortness of breath   . Thyroid disease   . Compression fracture of lumbar vertebra   . Hyperglycemia, drug-induced   . Hyperlipidemia   . Osteoporosis 2013  . Tobacco abuse   . Chronic respiratory failure On home 02 2-3L    Past Surgical History  Procedure Date  . Tubal ligation   . Cervical biopsy     Prior to Admission medications   Medication Sig Start Date End Date Taking? Authorizing Provider  albuterol-ipratropium (COMBIVENT) 18-103 MCG/ACT inhaler Inhale 2 puffs into the lungs 4 (four) times daily as needed for wheezing. 05/06/12 05/06/13 Yes [name], MD  alendronate (FOSAMAX) 70 MG tablet Take 1 tablet (70 mg total) by mouth every 7 (seven) days. Take with a full glass of water on an empty stomach. 03/16/12 03/16/13 Yes [name], MD  aspirin EC 81 MG tablet Take 81 mg by mouth daily.     Yes Historical Provider, MD  budesonide-formoterol (SYMBICORT) 160-4.5 MCG/ACT inhaler Inhale 2 puffs into the lungs 2 (two) times daily.  10/10/11 10/09/12 Yes [name], MD  Calcium Carbonate-Vitamin D (CALCIUM 600/VITAMIN D) 600-400 MG-UNIT per tablet Take 1 tablet by mouth 2 (two) times daily.   Yes Historical Provider, MD  Multiple Vitamin (MULITIVITAMIN WITH MINERALS) TABS Take 1 tablet by mouth daily.    Yes Historical Provider, MD  simvastatin (ZOCOR) 20 MG tablet Take 1 tablet (20 mg total) by mouth every evening. 04/02/12 04/02/13 Yes [name], MD  pantoprazole (PROTONIX) 40 MG tablet Take 40 mg by mouth daily.    Historical  Provider, MD    Allergies:  Allergies  Allergen Reactions  . Doxycycline Other (See Comments)    Gave patient oral thrush  . Avelox (Moxifloxacin Hcl In Nacl) Itching and Rash    Family History  Problem Relation Age of Onset  . Heart disease Mother   . Hypertension Sister   . Hypertension Brother     Social History: She is single. She has 3 children. She lives in [place]. She  receives disability benefits for COPD. She smokes approximately one pack of cigarettes per day. She denies alcohol and illicit drug use.         ROS: As above in history present illness. In addition, she has intermittent swelling in her legs, body aches, and a chronic nonproductive cough. Otherwise review of systems is negative.  PHYSICAL EXAM: Blood pressure 111/71, pulse 85, temperature 98.1 F (36.7 C), temperature source Oral, resp. rate 18, height 5\' 2"  (1.575 m), weight 63.504 kg (140 lb), SpO2 93.00%. General: 56 year old Caucasian woman, sitting up in bed, with mild shortness of breath when speaking. HEENT: Head is normocephalic, nontraumatic. Pupils are equal, round, and reactive to light. Extraocular movements are intact. Conjunctivae are clear. Sclerae are white. Oropharynx reveals a set of dentures. Mucous membranes are mildly dry. No posterior exudates or erythema. Neck: Supple, no thyromegaly, no JVD, no adenopathy. Lungs: Her breathing is mildly labored, however, she is not using accessory muscles. She is able to speak in short complete sentences. There are diffuse bilateral rhonchus wheezing. Heart: S1, S2, with borderline tachycardia. Abdomen: Mildly obese, positive bowel sounds, soft, nontender, nondistended. No hepatosplenomegaly. No masses palpated. Extremities: Pedal pulses are palpable bilaterally. No pedal edema. No pretibial edema. No acute hot joints. Neurologic: Cranial nerves II through XII are intact. Strength is 5 over 5 in the supine position. Sensation is grossly intact. Psychiatric: She is alert and oriented x3. Her affect is flat. She is cooperative.  Basic Metabolic Panel:  Basename 06/27/12 0837  NA 139  K 4.0  CL 103  CO2 26  GLUCOSE 169*  BUN 7  CREATININE 0.60  CALCIUM 9.4  MG --  PHOS --   Liver Function Tests: No results found for this basename: AST:2,ALT:2,ALKPHOS:2,BILITOT:2,PROT:2,ALBUMIN:2 in the last 72 hours No results found for  this basename: LIPASE:2,AMYLASE:2 in the last 72 hours No results found for this basename: AMMONIA:2 in the last 72 hours CBC:  Basename 06/27/12 0837  WBC 22.1*  NEUTROABS 20.1*  HGB 14.2  HCT 42.8  MCV 92.6  PLT 246   Cardiac Enzymes: No results found for this basename: CKTOTAL:3,CKMB:3,CKMBINDEX:3,TROPONINI:3 in the last 72 hours BNP:  Basename 06/27/12 0837  PROBNP 61.8   D-Dimer: No results found for this basename: DDIMER:2 in the last 72 hours CBG: No results found for this basename: GLUCAP:6 in the last 72 hours Hemoglobin A1C: No results found for this basename: HGBA1C in the last 72 hours Fasting Lipid Panel: No results found for this basename: CHOL,HDL,LDLCALC,TRIG,CHOLHDL,LDLDIRECT in the last 72 hours Thyroid Function Tests: No results found for this basename: TSH,T4TOTAL,FREET4,T3FREE,THYROIDAB in the last 72 hours Anemia Panel: No results found for this basename: VITAMINB12,FOLATE,FERRITIN,TIBC,IRON,RETICCTPCT in the last 72 hours Coagulation: No results found for this basename: LABPROT:2,INR:2 in the last 72 hours Urine Drug Screen: Drugs of Abuse  No results found for this basename: labopia, cocainscrnur, labbenz, amphetmu, thcu, labbarb    Alcohol Level: No results found for this basename: ETH:2 in the last 72 hours Urinalysis: No results found for this basename: COLORURINE:2,APPERANCEUR:2,LABSPEC:2,PHURINE:2,GLUCOSEU:2,HGBUR:2,BILIRUBINUR:2,KETONESUR:2,PROTEINUR:2,UROBILINOGEN:2,NITRITE:2,LEUKOCYTESUR:2 in  the last 72 hours    No results found for this or any previous visit (from the past 240 hour(s)).   Results for orders placed during the hospital encounter of 06/27/12 (from the past 48 hour(s))  BASIC METABOLIC PANEL     Status: Abnormal   Collection Time   06/27/12  8:37 AM      Component Value Range Comment   Sodium 139  135 - 145 mEq/L    Potassium 4.0  3.5 - 5.1 mEq/L    Chloride 103  96 - 112 mEq/L    CO2 26  19 - 32 mEq/L    Glucose,  Bld 169 (*) 70 - 99 mg/dL    BUN 7  6 - 23 mg/dL    Creatinine, Ser 4.78  0.50 - 1.10 mg/dL    Calcium 9.4  8.4 - 29.5 mg/dL    GFR calc non Af Amer >90  >90 mL/min    GFR calc Af Amer >90  >90 mL/min   PRO B NATRIURETIC PEPTIDE     Status: Normal   Collection Time   06/27/12  8:37 AM      Component Value Range Comment   Pro B Natriuretic peptide (BNP) 61.8  0 - 125 pg/mL   CBC     Status: Abnormal   Collection Time   06/27/12  8:37 AM      Component Value Range Comment   WBC 22.1 (*) 4.0 - 10.5 K/uL    RBC 4.62  3.87 - 5.11 MIL/uL    Hemoglobin 14.2  12.0 - 15.0 g/dL    HCT 62.1  30.8 - 65.7 %    MCV 92.6  78.0 - 100.0 fL    MCH 30.7  26.0 - 34.0 pg    MCHC 33.2  30.0 - 36.0 g/dL    RDW 84.6  96.2 - 95.2 %    Platelets 246  150 - 400 K/uL   DIFFERENTIAL     Status: Abnormal   Collection Time   06/27/12  8:37 AM      Component Value Range Comment   Neutrophils Relative 91 (*) 43 - 77 %    Neutro Abs 20.1 (*) 1.7 - 7.7 K/uL    Lymphocytes Relative 5 (*) 12 - 46 %    Lymphs Abs 1.0  0.7 - 4.0 K/uL    Monocytes Relative 4  3 - 12 %    Monocytes Absolute 1.0  0.1 - 1.0 K/uL    Eosinophils Relative 0  0 - 5 %    Eosinophils Absolute 0.0  0.0 - 0.7 K/uL    Basophils Relative 0  0 - 1 %    Basophils Absolute 0.1  0.0 - 0.1 K/uL     Dg Chest Portable 1 View  06/27/2012  *RADIOLOGY REPORT*  Clinical Data: Shortness of breath.  History of COPD.  PORTABLE CHEST - 1 VIEW  Comparison: Chest x-ray 02/23/2012.  Findings: Lungs again appear hyperexpanded with pruning of the pulmonary vasculature in the periphery, suggesting underlying COPD. No focal consolidative airspace disease.  No pleural effusions.  No evidence of pulmonary edema.  Heart size and mediastinal contours are unremarkable.  Atherosclerotic calcifications within the thoracic aorta.  IMPRESSION: 1.  Changes compatible with COPD redemonstrated, as above, without radiographic evidence of acute cardiopulmonary disease. 2.   Atherosclerosis.  Original Report Authenticated By: Florencia Reasons, M.D.    Impression:  Principal Problem:  *Acute and chronic respiratory failure with hypoxia Active  Problems:  COPD with acute exacerbation  Tobacco abuse  Sinus tachycardia  Hyperglycemia, drug-induced  Leukocytosis   This is a 56 year old woman with a known history of oxygen-dependent COPD who still continues to smoke. She presents with acute on chronic respiratory failure with hypoxia even while she is being supplemented with oxygen. ABG was ordered and is pending to assess for hypercapnia. She is tachycardic secondary to respiratory distress and bronchodilator therapy. She has a history of steroid-induced hyperglycemia. Her venous glucose was 169 on admission. She will need to be assessed for diabetes. Of note, she was recently treated with a Depo-Medrol injection by her primary care physician Dr. Jeanice Lim in May 2013. The patient's leukocytosis may also be secondary to steroids. There was no pneumonia evident on the chest x-ray. She may have infectious acute bronchitis, however.  Plan: 1. The patient received Solu-Medrol and albuterol and Atrovent nebulizers in the emergency department. 2. We'll continue treatment with Solu-Medrol, Xopenex nebulizer due to tachycardia, Atrovent nebulizer, azithromycin, Rocephin, and continued oxygen. We'll continue Symbicort. 3. We'll start Mucinex and as needed Robitussin. 4. We'll continue proton pump inhibitor therapy. 5. We'll place a nicotine patch. We'll order tobacco cessation counseling. I encouraged the patient to stop smoking forever. 6. We'll start sliding scale NovoLog. We'll order hemoglobin A1c. We'll also assess her thyroid function. 7. For further evaluation, we'll order an ABG, TSH, free T4, and hemoglobin A1c. Order hepatic function panel in the morning.    Total critical care time: One hour.  Johanne Mcglade 06/27/2012, 2:05 PM

## 2012-06-27 NOTE — ED Notes (Signed)
Pt o2 saturation dropped to 88% on 2lpm, put pt on 3lpm and EDPA notified.

## 2012-06-27 NOTE — ED Provider Notes (Signed)
History     CSN: [id number]  Arrival date & time 06/27/12  0806   First MD Initiated Contact with Patient 06/27/12 [phone]      No chief complaint on file.   (Consider location/radiation/quality/duration/timing/severity/associated sxs/prior treatment) HPI Comments: Patient has a history of chronic obstructive pulmonary disease. She has required hospitalization for the same. The patient states that in the last 48 hour she's been having increasing problems with her breathing. She's had wheezing and cough.there is been no hemoptysis. No reported high fever. She has tried Combivent inhalers as well as her Symbicort inhalers, but these are not improving. Patient presents now for assistance with her shortness of breath and cough.  The history is provided by the patient.    Past Medical History  Diagnosis Date  . COPD (chronic obstructive pulmonary disease)   . Shortness of breath   . Thyroid disease   . Compression fracture of lumbar vertebra   . Hyperglycemia, drug-induced   . Hyperlipidemia   . Osteoporosis 2013    Past Surgical History  Procedure Date  . Tubal ligation   . Cervical biopsy     Family History  Problem Relation Age of Onset  . Heart disease Mother   . Hypertension Sister   . Hypertension Brother     History  Substance Use Topics  . Smoking status: Current Everyday Smoker -- 0.8 packs/day    Types: Cigarettes  . Smokeless tobacco: Not on file  . Alcohol Use: No    OB History    Grav Para Term Preterm Abortions TAB SAB Ect Mult Living                  Review of Systems  Constitutional: Negative for activity change.       All ROS Neg except as noted in HPI  HENT: Negative for nosebleeds and neck pain.   Eyes: Negative for photophobia and discharge.  Respiratory: Positive for cough, shortness of breath and wheezing.   Cardiovascular: Negative for chest pain and palpitations.  Gastrointestinal: Negative for abdominal pain and blood in stool.    Genitourinary: Negative for dysuria, frequency and hematuria.  Musculoskeletal: Positive for back pain. Negative for arthralgias.  Skin: Negative.   Neurological: Negative for dizziness, seizures and speech difficulty.  Psychiatric/Behavioral: Negative for hallucinations and confusion.    Allergies  Doxycycline and Avelox  Home Medications   Current Outpatient Rx  Name Route Sig Dispense Refill  . ALBUTEROL SULFATE HFA 108 (90 BASE) MCG/ACT IN AERS Inhalation Inhale 2 puffs into the lungs every 4 (four) hours as needed. 1 Inhaler 6  . IPRATROPIUM-ALBUTEROL 18-103 MCG/ACT IN AERO Inhalation Inhale 2 puffs into the lungs 4 (four) times daily as needed for wheezing. 1 Inhaler 2  . ALENDRONATE SODIUM 70 MG PO TABS Oral Take 1 tablet (70 mg total) by mouth every 7 (seven) days. Take with a full glass of water on an empty stomach. 4 tablet 11  . ASPIRIN EC 81 MG PO TBEC Oral Take 81 mg by mouth daily.      . BUDESONIDE-FORMOTEROL FUMARATE 160-4.5 MCG/ACT IN AERO Inhalation Inhale 2 puffs into the lungs 2 (two) times daily.     . FUROSEMIDE 20 MG PO TABS Oral Take 1 tablet (20 mg total) by mouth daily. 3 tablet 0  . GUAIFENESIN ER 600 MG PO TB12 Oral Take 1 tablet (600 mg total) by mouth 2 (two) times daily. 60 tablet 2  . ADULT MULTIVITAMIN W/MINERALS CH Oral Take 1  tablet by mouth daily. One a day    . PANTOPRAZOLE SODIUM 40 MG PO TBEC Oral Take 40 mg by mouth daily.    Marland Kitchen PREDNISONE 10 MG PO TABS  Take 40mg  by mouth daily x 5 days 20 tablet 0  . SIMVASTATIN 20 MG PO TABS Oral Take 1 tablet (20 mg total) by mouth every evening. 30 tablet 3  . TIOTROPIUM BROMIDE MONOHYDRATE 18 MCG IN CAPS Inhalation Place 1 capsule (18 mcg total) into inhaler and inhale daily. 30 capsule 3    BP 143/83  Pulse 134  Temp 98.1 F (36.7 C) (Oral)  Resp 25  Ht 5\' 2"  (1.575 m)  Wt 140 lb (63.504 kg)  BMI 25.61 kg/m2  SpO2 84%  Physical Exam  Nursing note and vitals reviewed. Constitutional: She is  oriented to person, place, and time. She appears well-developed and well-nourished.  Non-toxic appearance.  HENT:  Head: Normocephalic.  Right Ear: Tympanic membrane and external ear normal.  Left Ear: Tympanic membrane and external ear normal.  Eyes: EOM and lids are normal. Pupils are equal, round, and reactive to light.  Neck: Normal range of motion. Neck supple. Carotid bruit is not present.  Cardiovascular: Regular rhythm, normal heart sounds, intact distal pulses and normal pulses.  Tachycardia present.   Pulmonary/Chest: She has wheezes. She has rhonchi.       Pt using assessory muscles to breathe.  Abdominal: Soft. Bowel sounds are normal. There is no tenderness. There is no guarding.  Musculoskeletal: Normal range of motion.  Lymphadenopathy:       Head (right side): No submandibular adenopathy present.       Head (left side): No submandibular adenopathy present.    She has no cervical adenopathy.  Neurological: She is alert and oriented to person, place, and time. She has normal strength. No cranial nerve deficit or sensory deficit.  Skin: Skin is warm and dry.  Psychiatric: She has a normal mood and affect. Her speech is normal.    ED Course  Procedures (including critical care time)   Labs Reviewed  BASIC METABOLIC PANEL  PRO B NATRIURETIC PEPTIDE  CBC  DIFFERENTIAL   No results found. EKG: normal sinus rhythm, nonspecific ST and T waves changes. Rate 117, Biatrial enlargement. No STEMI, No life threatening arrhythmia.  No diagnosis found.    MDM  I have reviewed nursing notes, vital signs, and all appropriate lab and imaging results for this patient. Pt continues to work assess ory  Muscles for breathing with wheezing after 1st neb tx. Pulse ox 88 to 93 on 2 liters of O2. Pt speaking in complete sentences. Chest xray non acute. Pt received 3 neb tx in ED. Upon evaluation with walking in ED pulse ox dropped to poor level . Pt seen with me by Dr. Nena Jordan  with hospital ist for admission.       Kathie Dike, [place] 07/02/12 1241

## 2012-06-27 NOTE — ED Notes (Signed)
Patient was given a Coke to drink per TEFL teacher.

## 2012-06-27 NOTE — ED Notes (Signed)
Family at bedside. 

## 2012-06-28 DIAGNOSIS — F17200 Nicotine dependence, unspecified, uncomplicated: Secondary | ICD-10-CM

## 2012-06-28 DIAGNOSIS — J441 Chronic obstructive pulmonary disease with (acute) exacerbation: Principal | ICD-10-CM

## 2012-06-28 DIAGNOSIS — J9620 Acute and chronic respiratory failure, unspecified whether with hypoxia or hypercapnia: Secondary | ICD-10-CM

## 2012-06-28 DIAGNOSIS — R0902 Hypoxemia: Secondary | ICD-10-CM

## 2012-06-28 LAB — BLOOD GAS, ARTERIAL
Bicarbonate: 24.9 mEq/L — ABNORMAL HIGH (ref 20.0–24.0)
O2 Content: 2 L/min
pCO2 arterial: 42.2 mmHg (ref 35.0–45.0)
pO2, Arterial: 74.8 mmHg — ABNORMAL LOW (ref 80.0–100.0)

## 2012-06-28 LAB — BASIC METABOLIC PANEL
BUN: 9 mg/dL (ref 6–23)
Chloride: 102 mEq/L (ref 96–112)
GFR calc Af Amer: >90 mL/min (ref >90)
GFR calc non Af Amer: >90 mL/min (ref >90)
Potassium: 3.7 mEq/L (ref 3.5–5.1)

## 2012-06-28 LAB — CBC
Hemoglobin: 13.2 g/dL (ref 12.0–15.0)
MCHC: 32.5 g/dL (ref 30.0–36.0)
RDW: 13.8 % (ref 11.5–15.5)
WBC: 18.8 10*3/uL — ABNORMAL HIGH (ref 4.0–10.5)

## 2012-06-28 LAB — HEPATIC FUNCTION PANEL
AST: 15 U/L (ref 0–37)
Albumin: 3.5 g/dL (ref 3.5–5.2)
Alkaline Phosphatase: 60 U/L (ref 39–117)
Total Protein: 6.7 g/dL (ref 6.0–8.3)

## 2012-06-28 MED ORDER — PREDNISONE 20 MG PO TABS
60.0000 mg | ORAL_TABLET | Freq: Two times a day (BID) | ORAL | Status: DC
  Administered 2012-06-28: 60 mg via ORAL
  Filled 2012-06-28: qty 3

## 2012-06-28 MED ORDER — SODIUM CHLORIDE 0.9 % IJ SOLN
INTRAMUSCULAR | Status: AC
  Filled 2012-06-28: qty 3

## 2012-06-28 MED ORDER — PREDNISONE (PAK) 10 MG PO TABS: 10.0000 mg | ORAL_TABLET | Freq: Every day | ORAL | Status: DC

## 2012-06-28 MED ORDER — NICOTINE 14 MG/24HR TD PT24: 1.0000 | MEDICATED_PATCH | Freq: Every day | TRANSDERMAL | Status: DC

## 2012-06-28 MED ORDER — AZITHROMYCIN 250 MG PO TABS: 250.0000 mg | ORAL_TABLET | Freq: Every day | ORAL | Status: DC

## 2012-06-28 MED ORDER — BUDESONIDE-FORMOTEROL FUMARATE 160-4.5 MCG/ACT IN AERO
2.0000 | INHALATION_SPRAY | Freq: Two times a day (BID) | RESPIRATORY_TRACT | Status: DC
  Administered 2012-06-28: 2 via RESPIRATORY_TRACT
  Filled 2012-06-28: qty 6

## 2012-06-28 MED ORDER — ACETAMINOPHEN 325 MG PO TABS: 650.0000 mg | ORAL_TABLET | Freq: Four times a day (QID) | ORAL | Status: DC | PRN

## 2012-06-28 MED ORDER — LEVALBUTEROL HCL 0.63 MG/3ML IN NEBU
0.6300 mg | INHALATION_SOLUTION | RESPIRATORY_TRACT | Status: DC
  Administered 2012-06-28 (×3): 0.63 mg via RESPIRATORY_TRACT
  Filled 2012-06-28: qty 3

## 2012-06-28 MED ORDER — AZITHROMYCIN 250 MG PO TABS
250.0000 mg | ORAL_TABLET | Freq: Every day | ORAL | Status: DC
  Administered 2012-06-28: 250 mg via ORAL
  Filled 2012-06-28: qty 1

## 2012-06-28 NOTE — Progress Notes (Signed)
Pt discharged to home with family member. D/c instructions regarding activity, diet, follow-up visit, and signs and symptoms of URI and iv site infection given. Pt expressed understanding. IV removed. Site dry and intact, no signs of infection.

## 2012-06-28 NOTE — Discharge Instructions (Signed)

## 2012-06-28 NOTE — Progress Notes (Signed)
Patient ambulated in hallway on oxygen with assistance from NT. Patient tolerated ambulation well. MD aware of outcome.

## 2012-06-28 NOTE — Discharge Summary (Signed)
Physician Discharge Summary  Patient ID: Cindy [name] MRN: [id number] DOB/AGE: [birth year] 56 y.o.  Admit date: 06/27/2012 Discharge date: 06/28/2012  Discharge Diagnoses:  Principal Problem:  *Acute and chronic respiratory failure with hypoxia Active Problems:  COPD with acute exacerbation  Tobacco abuse  Hyperglycemia, drug-induced  Sinus tachycardia  Leukocytosis   Medication List  As of 06/28/2012  4:24 PM   TAKE these medications         acetaminophen 325 MG tablet   Commonly known as: TYLENOL   Take 2 tablets (650 mg total) by mouth every 6 (six) hours as needed (or Fever >/= 101).      albuterol-ipratropium 18-103 MCG/ACT inhaler   Commonly known as: COMBIVENT   Inhale 2 puffs into the lungs 4 (four) times daily as needed for wheezing.      alendronate 70 MG tablet   Commonly known as: FOSAMAX   Take 1 tablet (70 mg total) by mouth every 7 (seven) days. Take with a full glass of water on an empty stomach.      aspirin EC 81 MG tablet   Take 81 mg by mouth daily.      azithromycin 250 MG tablet   Commonly known as: ZITHROMAX   Take 1 tablet (250 mg total) by mouth daily.      budesonide-formoterol 160-4.5 MCG/ACT inhaler   Commonly known as: SYMBICORT   Inhale 2 puffs into the lungs 2 (two) times daily.      CALCIUM 600/VITAMIN D 600-400 MG-UNIT per tablet   Generic drug: Calcium Carbonate-Vitamin D   Take 1 tablet by mouth 2 (two) times daily.      multivitamin with minerals Tabs   Take 1 tablet by mouth daily.      nicotine 14 mg/24hr patch   Commonly known as: NICODERM CQ - dosed in mg/24 hours   Place 1 patch onto the skin daily.      pantoprazole 40 MG tablet   Commonly known as: PROTONIX   Take 40 mg by mouth daily.      predniSONE 10 MG tablet   Commonly known as: STERAPRED UNI-PAK   Take 1 tablet (10 mg total) by mouth daily. 4 tablets daily then decrease by 1 tablet every 2 days until off      simvastatin 20 MG tablet   Commonly known as:  ZOCOR   Take 1 tablet (20 mg total) by mouth every evening.            Discharge Orders    Future Appointments: Provider: Department: Dept Phone: Center:   07/06/2012 8:15 AM [name], MD [facility] [phone] [facility] [place]   07/14/2012 9:00 AM [name], MD [facility] [phone] [facility]     Future Orders Please Complete By Expires   Diet - low sodium heart healthy      Increase activity slowly      Discharge instructions      Comments:   Quit smoking      Follow-up Information    Follow up with Milinda Antis, MD on 07/06/2012. (8:15AM)    Contact information:   [address], [address] [place] [address] [phone]         Disposition: 01-Home or Self Care  Discharged Condition: improved  Consults:  none  Labs:   Results for orders placed during the hospital encounter of 06/27/12 (from the past 48 hour(s))  BASIC METABOLIC PANEL     Status: Abnormal  Collection Time   06/27/12  8:37 AM      Component Value Range Comment   Sodium 139  135 - 145 mEq/L    Potassium 4.0  3.5 - 5.1 mEq/L    Chloride 103  96 - 112 mEq/L    CO2 26  19 - 32 mEq/L    Glucose, Bld 169 (*) 70 - 99 mg/dL    BUN 7  6 - 23 mg/dL    Creatinine, Ser 6.29  0.50 - 1.10 mg/dL    Calcium 9.4  8.4 - 52.8 mg/dL    GFR calc non Af Amer >90  >90 mL/min    GFR calc Af Amer >90  >90 mL/min   PRO B NATRIURETIC PEPTIDE     Status: Normal   Collection Time   06/27/12  8:37 AM      Component Value Range Comment   Pro B Natriuretic peptide (BNP) 61.8  0 - 125 pg/mL   CBC     Status: Abnormal   Collection Time   06/27/12  8:37 AM      Component Value Range Comment   WBC 22.1 (*) 4.0 - 10.5 K/uL    RBC 4.62  3.87 - 5.11 MIL/uL    Hemoglobin 14.2  12.0 - 15.0 g/dL    HCT 41.3  24.4 - 01.0 %    MCV 92.6  78.0 - 100.0 fL    MCH 30.7  26.0 - 34.0 pg    MCHC 33.2  30.0 - 36.0 g/dL    RDW 27.2  53.6 - 64.4 %    Platelets 246  150 - 400 K/uL   DIFFERENTIAL      Status: Abnormal   Collection Time   06/27/12  8:37 AM      Component Value Range Comment   Neutrophils Relative 91 (*) 43 - 77 %    Neutro Abs 20.1 (*) 1.7 - 7.7 K/uL    Lymphocytes Relative 5 (*) 12 - 46 %    Lymphs Abs 1.0  0.7 - 4.0 K/uL    Monocytes Relative 4  3 - 12 %    Monocytes Absolute 1.0  0.1 - 1.0 K/uL    Eosinophils Relative 0  0 - 5 %    Eosinophils Absolute 0.0  0.0 - 0.7 K/uL    Basophils Relative 0  0 - 1 %    Basophils Absolute 0.1  0.0 - 0.1 K/uL   TSH     Status: Normal   Collection Time   06/27/12  8:38 AM      Component Value Range Comment   TSH 0.490  0.350 - 4.500 uIU/mL   HEMOGLOBIN A1C     Status: Abnormal   Collection Time   06/27/12  8:38 AM      Component Value Range Comment   Hemoglobin A1C 5.7 (*) <5.7 %    Mean Plasma Glucose 117 (*) <117 mg/dL   T4, FREE     Status: Normal   Collection Time   06/27/12  2:08 PM      Component Value Range Comment   Free T4 1.05  0.80 - 1.80 ng/dL   BLOOD GAS, ARTERIAL     Status: Abnormal   Collection Time   06/27/12  2:20 PM      Component Value Range Comment   O2 Content 4.0      pH, Arterial 7.338 (*) 7.350 - 7.400    pCO2 arterial 46.3 (*) 35.0 - 45.0  mmHg    pO2, Arterial 94.1  80.0 - 100.0 mmHg    Bicarbonate 24.2 (*) 20.0 - 24.0 mEq/L    TCO2 21.6  0 - 100 mmol/L    Acid-base deficit 0.8  0.0 - 2.0 mmol/L    O2 Saturation 96.8      Patient temperature 37.0      Collection site RIGHT RADIAL      Drawn by [id number]      Sample type ARTERIAL      Allens test (pass/fail) PASS  PASS   MRSA PCR SCREENING     Status: Normal   Collection Time   06/27/12  2:47 PM      Component Value Range Comment   MRSA by PCR NEGATIVE  NEGATIVE   GLUCOSE, CAPILLARY     Status: Abnormal   Collection Time   06/27/12  4:30 PM      Component Value Range Comment   Glucose-Capillary 173 (*) 70 - 99 mg/dL   GLUCOSE, CAPILLARY     Status: Abnormal   Collection Time   06/27/12  9:30 PM      Component Value Range Comment    Glucose-Capillary 178 (*) 70 - 99 mg/dL    Comment 1 Notify RN     BASIC METABOLIC PANEL     Status: Abnormal   Collection Time   06/28/12  4:40 AM      Component Value Range Comment   Sodium 139  135 - 145 mEq/L    Potassium 3.7  3.5 - 5.1 mEq/L    Chloride 102  96 - 112 mEq/L    CO2 26  19 - 32 mEq/L    Glucose, Bld 140 (*) 70 - 99 mg/dL    BUN 9  6 - 23 mg/dL    Creatinine, Ser 9.81  0.50 - 1.10 mg/dL    Calcium 9.3  8.4 - 19.1 mg/dL    GFR calc non Af Amer >90  >90 mL/min    GFR calc Af Amer >90  >90 mL/min   CBC     Status: Abnormal   Collection Time   06/28/12  4:40 AM      Component Value Range Comment   WBC 18.8 (*) 4.0 - 10.5 K/uL    RBC 4.37  3.87 - 5.11 MIL/uL    Hemoglobin 13.2  12.0 - 15.0 g/dL    HCT 47.8  29.5 - 62.1 %    MCV 92.9  78.0 - 100.0 fL    MCH 30.2  26.0 - 34.0 pg    MCHC 32.5  30.0 - 36.0 g/dL    RDW 30.8  65.7 - 84.6 %    Platelets 245  150 - 400 K/uL   HEPATIC FUNCTION PANEL     Status: Abnormal   Collection Time   06/28/12  4:40 AM      Component Value Range Comment   Total Protein 6.7  6.0 - 8.3 g/dL    Albumin 3.5  3.5 - 5.2 g/dL    AST 15  0 - 37 U/L    ALT 19  0 - 35 U/L    Alkaline Phosphatase 60  39 - 117 U/L    Total Bilirubin 0.2 (*) 0.3 - 1.2 mg/dL    Bilirubin, Direct <9.6  0.0 - 0.3 mg/dL    Indirect Bilirubin NOT CALCULATED  0.3 - 0.9 mg/dL   GLUCOSE, CAPILLARY     Status: Abnormal   Collection Time   06/28/12  7:33 AM      Component Value Range Comment   Glucose-Capillary 122 (*) 70 - 99 mg/dL    Comment 1 Notify RN      Comment 2 Documented in Chart     BLOOD GAS, ARTERIAL     Status: Abnormal   Collection Time   06/28/12  7:40 AM      Component Value Range Comment   O2 Content 2.0      Delivery systems NASAL CANNULA      pH, Arterial 7.389  7.350 - 7.400    pCO2 arterial 42.2  35.0 - 45.0 mmHg    pO2, Arterial 74.8 (*) 80.0 - 100.0 mmHg    Bicarbonate 24.9 (*) 20.0 - 24.0 mEq/L    TCO2 22.4  0 - 100 mmol/L    Acid-Base  Excess 0.5  0.0 - 2.0 mmol/L    O2 Saturation 95.3      Patient temperature 37.0      Collection site LEFT RADIAL      Drawn by COLLECTED BY RT      Sample type ARTERIAL DRAW      Allens test (pass/fail) PASS  PASS     Diagnostics:  Dg Chest Portable 1 View  06/27/2012  *RADIOLOGY REPORT*  Clinical Data: Shortness of breath.  History of COPD.  PORTABLE CHEST - 1 VIEW  Comparison: Chest x-ray 02/23/2012.  Findings: Lungs again appear hyperexpanded with pruning of the pulmonary vasculature in the periphery, suggesting underlying COPD. No focal consolidative airspace disease.  No pleural effusions.  No evidence of pulmonary edema.  Heart size and mediastinal contours are unremarkable.  Atherosclerotic calcifications within the thoracic aorta.  IMPRESSION: 1.  Changes compatible with COPD redemonstrated, as above, without radiographic evidence of acute cardiopulmonary disease. 2.  Atherosclerosis.  Original Report Authenticated By: Florencia Reasons, M.D.   Full Code   Hospital Course: See H&P for complete admission details. Ms. Fenley is a pleasant 56 year old white female smoker on chronic oxygen who presents with shortness of breath cough wheezing. She had been given steroids as an outpatient. Chest x-ray showed no infiltrate. In the emergency room, she was hypoxic on supplemental oxygen. She had mildly labored breathing. She had rhonchi and wheeze. She was admitted overnight in the ICU and the following morning was requesting discharge. She is oxygenating fine on supplemental oxygen. She has ambulated around the unit and is adamant about discharge. As she is much improved, hemodynamically stable, and has good exercise tolerance, she can be discharged with close followup. I will give her levofloxacin and a steroid taper. She was encouraged to quit smoking. Total time on the day of discharge greater than 30 minutes.  Discharge Exam:  Blood pressure 106/78, pulse 98, temperature 98.3 F (36.8 C),  temperature source Oral, resp. rate 18, height 5\' 2"  (1.575 m), weight 65.2 kg (143 lb 11.8 oz), SpO2 94.00%.  Neural: Comfortable. Talkative. No respiratory distress Lungs: Good air movement. Rhonchi and slight wheeze. Cardiovascular regular rate rhythm without murmurs gallops rubs Extremities no clubbing cyanosis or edema  Signed: Dene Landsberg L 06/28/2012, 4:24 PM

## 2012-07-05 NOTE — ED Provider Notes (Signed)
See prior note   Ward Givens, MD 07/05/12 2107

## 2012-07-06 ENCOUNTER — Encounter: Payer: Self-pay | Admitting: Family Medicine

## 2012-07-06 ENCOUNTER — Ambulatory Visit (INDEPENDENT_AMBULATORY_CARE_PROVIDER_SITE_OTHER): Payer: Medicaid Other | Admitting: Family Medicine

## 2012-07-06 VITALS — BP 110/80 | HR 117 | Resp 16 | Ht 63.0 in | Wt 141.4 lb

## 2012-07-06 DIAGNOSIS — T50904A Poisoning by unspecified drugs, medicaments and biological substances, undetermined, initial encounter: Secondary | ICD-10-CM

## 2012-07-06 DIAGNOSIS — F17200 Nicotine dependence, unspecified, uncomplicated: Secondary | ICD-10-CM

## 2012-07-06 DIAGNOSIS — R7309 Other abnormal glucose: Secondary | ICD-10-CM

## 2012-07-06 DIAGNOSIS — R739 Hyperglycemia, unspecified: Secondary | ICD-10-CM

## 2012-07-06 DIAGNOSIS — T50905A Adverse effect of unspecified drugs, medicaments and biological substances, initial encounter: Secondary | ICD-10-CM

## 2012-07-06 DIAGNOSIS — J441 Chronic obstructive pulmonary disease with (acute) exacerbation: Secondary | ICD-10-CM

## 2012-07-06 DIAGNOSIS — Z720 Tobacco use: Secondary | ICD-10-CM

## 2012-07-06 MED ORDER — PREDNISONE 10 MG PO TABS: ORAL_TABLET | ORAL | Status: DC

## 2012-07-06 MED ORDER — SIMVASTATIN 20 MG PO TABS: 20.0000 mg | ORAL_TABLET | Freq: Every evening | ORAL | Status: DC

## 2012-07-06 NOTE — Patient Instructions (Addendum)
For your breathing use the Combivent four times a day as needed and the symbicort  Wear the oxygen as needed during the day as well Continue your other medications Prednisone sent - please call and let me know if you need to use it Change f/u appt to 2 weeks from today

## 2012-07-06 NOTE — Progress Notes (Signed)
  Subjective:    Patient ID: Cindy [name], female    DOB: 1956-06-13, 56 y.o.   MRN: [id number]  HPI Pt admitted overnight for COPD exacerbation it was noted she was very adament about leaving and did not want to stay in the hospital, record reviewed. Completed course of antibiotics, she continues to have some wheezing and feels flushed at times. She is not wearing her oxygen during the day. She is concerned about keeping her weight down today and not so much her breathing. She is using combivent and symbicort but does not like the new combivent inhaler   Review of Systems    GEN- denies fatigue, fever, weight loss,weakness, recent illness HEENT- denies eye drainage, change in vision, nasal discharge, CVS- denies chest pain, palpitations RESP- denies SOB, cough, wheeze ABD- denies N/V, change in stools, abd pain GU- denies dysuria, hematuria, dribbling, incontinence MSK- denies joint pain, muscle aches, injury Neuro- denies headache, dizziness, syncope, seizure activity      Objective:   Physical Exam GEN- NAD, alert and oriented x3 HEENT-  MMM, oropharynx clear, PERRL, EOMI  Neck- Supple  CVS- RRR no murmur ( HR 88)  RESP-few bialteral scattered wheeze throughout,normal WOB, no retractions, fair air movement  EXT- No edema  Pulses- Radial, DP- 2+        Assessment & Plan:

## 2012-07-07 NOTE — Assessment & Plan Note (Signed)
Reiterated need for tobacco cessation 

## 2012-07-07 NOTE — Assessment & Plan Note (Signed)
This is noted but secondary to steroid use, I have been monitoring her glucose and A1C she  Is not diabetic

## 2012-07-07 NOTE — Assessment & Plan Note (Addendum)
Will have her complete her current inhalers, we had had a lot of difficulty getting her on the correct inhalers, my ideal plan would be to have her on,  Spiriva and Symbicort. Encouraged use her oxygen during the day especially with acute illness or if she feels SOB, She continues to go back and forth about which short acting inhaler works best for her

## 2012-07-14 ENCOUNTER — Ambulatory Visit: Payer: Medicaid Other | Admitting: Family Medicine

## 2012-07-15 ENCOUNTER — Encounter: Payer: Self-pay | Admitting: Family Medicine

## 2012-07-15 ENCOUNTER — Ambulatory Visit (INDEPENDENT_AMBULATORY_CARE_PROVIDER_SITE_OTHER): Payer: Medicaid Other | Admitting: Family Medicine

## 2012-07-15 VITALS — BP 112/82 | HR 110 | Temp 98.6°F | Resp 20 | Ht 63.0 in | Wt 141.1 lb

## 2012-07-15 DIAGNOSIS — J441 Chronic obstructive pulmonary disease with (acute) exacerbation: Secondary | ICD-10-CM

## 2012-07-15 DIAGNOSIS — Z720 Tobacco use: Secondary | ICD-10-CM

## 2012-07-15 DIAGNOSIS — F17200 Nicotine dependence, unspecified, uncomplicated: Secondary | ICD-10-CM

## 2012-07-15 DIAGNOSIS — J449 Chronic obstructive pulmonary disease, unspecified: Secondary | ICD-10-CM

## 2012-07-15 DIAGNOSIS — J019 Acute sinusitis, unspecified: Secondary | ICD-10-CM | POA: Insufficient documentation

## 2012-07-15 MED ORDER — ALBUTEROL SULFATE (2.5 MG/3ML) 0.083% IN NEBU: 2.5000 mg | INHALATION_SOLUTION | RESPIRATORY_TRACT | Status: DC | PRN

## 2012-07-15 MED ORDER — SODIUM CHLORIDE 0.9 % IV SOLN
125.0000 mg | Freq: Once | INTRAVENOUS | Status: AC
  Administered 2012-07-15: 130 mg via INTRAMUSCULAR

## 2012-07-15 MED ORDER — PREDNISONE 10 MG PO TABS: ORAL_TABLET | ORAL | Status: DC

## 2012-07-15 MED ORDER — ALBUTEROL SULFATE (5 MG/ML) 0.5% IN NEBU
2.5000 mg | INHALATION_SOLUTION | Freq: Once | RESPIRATORY_TRACT | Status: AC
  Administered 2012-07-15: 2.5 mg via RESPIRATORY_TRACT

## 2012-07-15 MED ORDER — IPRATROPIUM BROMIDE 0.02 % IN SOLN
0.5000 mg | Freq: Once | RESPIRATORY_TRACT | Status: AC
  Administered 2012-07-15: 0.5 mg via RESPIRATORY_TRACT

## 2012-07-15 MED ORDER — AMOXICILLIN-POT CLAVULANATE 875-125 MG PO TABS: 1.0000 | ORAL_TABLET | Freq: Two times a day (BID) | ORAL | Status: AC

## 2012-07-15 NOTE — Patient Instructions (Addendum)
F/U tomorrow morning for recheck Start augmentin  You will be referred to a lung doctor Use the nebulizer treatments every 4 hours Wear your oxygen during the day  Prednisone to be started tomorrow

## 2012-07-15 NOTE — Assessment & Plan Note (Signed)
Antibiotics per above 

## 2012-07-15 NOTE — Assessment & Plan Note (Signed)
She continues to smoke despite her worsening respiratory status. She was counseled on smoking cessation she does not seem ready to quit

## 2012-07-15 NOTE — Addendum Note (Signed)
Addended by: Kandis Fantasia B on: 07/15/2012 01:10 PM   Modules accepted: Orders

## 2012-07-15 NOTE — Assessment & Plan Note (Signed)
She is currently with another exacerbation I will start her on Augmentin this time. She was given Solu-Medrol in the office as well as nebulizer treatment. She will continue with oral steroids. She's to wear her oxygen 24 hours a day. She was given albuterol 2.5 mg neb solution to be used at home instead of Combivent. I will refer her to a pulmonologist.

## 2012-07-15 NOTE — Progress Notes (Signed)
  Subjective:    Patient ID: Cindy [name], female    DOB: 1956-05-15, 56 y.o.   MRN: [id number]  HPI Patient presents with shortness of breath nasal congestion and drainage. She states that for the past few days she has been coughing up yellow mucus which is thick and green discharge from her nose. Since her hospital admission she states she has not fully bounced back to her typical self and her breathing has not been very good. She continues to smoke and has actually been smoking more than normal. She wears her oxygen at night and throughout the day occasionally. She denies chest pain, fever, nausea, vomiting   Review of Systems  GEN- denies fatigue, fever, weight loss,weakness, recent illness HEENT- denies eye drainage, change in vision, nasal discharge, CVS- denies chest pain, palpitations RESP- + SOB, +cough, +wheeze ABD- denies N/V, change in stools, abd pain GU- denies dysuria, hematuria, dribbling, incontinence MSK- denies joint pain, muscle aches, injury Neuro- denies headache, dizziness, syncope, seizure activity      Objective:   Physical Exam GEN- NAD, alert and oriented x3 HEENT- PERRL, EOMI, non injected sclera, pink conjunctiva, MMM, oropharynx clear, + sinus pressure, thick nasal discharge, TM clear no effusion  Neck- Supple, no LAD CVS- tachycardic no murmur RESP-bilateral wheeze, decreased BS bases, fair air movement , increased WOB with exertion, no retractions  EXT- No edema Pulses- Radial, DP- 2+  S/p neb, improved WOB and air movement        Assessment & Plan:

## 2012-07-16 ENCOUNTER — Ambulatory Visit (INDEPENDENT_AMBULATORY_CARE_PROVIDER_SITE_OTHER): Payer: Medicaid Other | Admitting: Family Medicine

## 2012-07-16 ENCOUNTER — Encounter: Payer: Self-pay | Admitting: Family Medicine

## 2012-07-16 VITALS — BP 132/82 | HR 90 | Resp 18 | Wt 144.0 lb

## 2012-07-16 DIAGNOSIS — J441 Chronic obstructive pulmonary disease with (acute) exacerbation: Secondary | ICD-10-CM

## 2012-07-16 DIAGNOSIS — J9621 Acute and chronic respiratory failure with hypoxia: Secondary | ICD-10-CM

## 2012-07-16 DIAGNOSIS — J9620 Acute and chronic respiratory failure, unspecified whether with hypoxia or hypercapnia: Secondary | ICD-10-CM

## 2012-07-16 DIAGNOSIS — R0902 Hypoxemia: Secondary | ICD-10-CM

## 2012-07-16 DIAGNOSIS — F17200 Nicotine dependence, unspecified, uncomplicated: Secondary | ICD-10-CM

## 2012-07-16 DIAGNOSIS — Z720 Tobacco use: Secondary | ICD-10-CM

## 2012-07-16 NOTE — Patient Instructions (Addendum)
Continue your medications Go to the ER if your breathing does not get better Wear your oxygen all the time  Keep oxygen at 2 L Congratulations on the smoking keep it! F/U 2 months - please cancel previous appt

## 2012-07-16 NOTE — Assessment & Plan Note (Signed)
Pt has not smoked since yesterday, continue to give her encouragement, hopefully she will actually stick to her goal of quitting

## 2012-07-16 NOTE — Progress Notes (Signed)
  Subjective:    Patient ID: MARIEANNE MARXEN, female    DOB: 01/13/1956, 56 y.o.   MRN: [id number]  HPI Patient presents to followup COPD exacerbation. She was seen yesterday started on Augmentin, and Steroids, neb treatments She feels she is doing better but knows her oxygen is dropping when she walks around. She has not smoked since yesterday    Review of Systems   GEN- denies fatigue, fever, weight loss,weakness, recent illness HEENT- denies eye drainage, change in vision, nasal discharge, CVS- denies chest pain, palpitations RESP- + SOB, +cough, +wheeze ABD- denies N/V, change in stools, abd pain GU- denies dysuria, hematuria, dribbling, incontinence MSK- denies joint pain, muscle aches, injury Neuro- denies headache, dizziness, syncope, seizure activity     Objective:   Physical Exam GEN- NAD, alert and oriented x3, overall looks improved from yesterday CVS- RRR no murmur RESP-bilateral wheeze, decreased BS bases, fair air movement , increased WOB with exertion, no retractions  EXT- No edema Pulses- Radial 2+  Ambulating her WOB increases, oxygen dropped to 86%       Assessment & Plan:

## 2012-07-16 NOTE — Assessment & Plan Note (Signed)
She appears stable for outpatient treatment, she will continue her nebs, oxygen 24 hours a day, advised to refrain from smoking, complete antibiotics and steroids

## 2012-07-16 NOTE — Assessment & Plan Note (Signed)
Pt oxygen dropped to 86% with mininal exertion, she needs oxygen around the clock, she has referral with pulmonary pending. I will also obtain portable oxygen for her.

## 2012-07-19 ENCOUNTER — Telehealth: Payer: Self-pay | Admitting: Family Medicine

## 2012-07-20 ENCOUNTER — Ambulatory Visit: Payer: Medicaid Other | Admitting: Family Medicine

## 2012-07-21 NOTE — Telephone Encounter (Signed)
Called and left message to call back.

## 2012-07-26 NOTE — Telephone Encounter (Signed)
Noted  

## 2012-07-28 ENCOUNTER — Telehealth: Payer: Self-pay

## 2012-07-28 MED ORDER — ALBUTEROL SULFATE HFA 108 (90 BASE) MCG/ACT IN AERS: 2.0000 | INHALATION_SPRAY | RESPIRATORY_TRACT | Status: DC | PRN

## 2012-07-28 MED ORDER — TIOTROPIUM BROMIDE MONOHYDRATE 18 MCG IN CAPS: 18.0000 ug | ORAL_CAPSULE | Freq: Every day | RESPIRATORY_TRACT | Status: DC

## 2012-07-28 NOTE — Telephone Encounter (Signed)
Since she has completed combivent, she should use albuterol rescue inhaler, restart Spiriva once a day, and continue symbicort. Medications sent

## 2012-07-29 NOTE — Progress Notes (Signed)
UR Chart Review Completed  

## 2012-08-09 ENCOUNTER — Telehealth: Payer: Self-pay | Admitting: Family Medicine

## 2012-08-09 NOTE — Telephone Encounter (Signed)
Pt aware.

## 2012-08-20 ENCOUNTER — Ambulatory Visit (INDEPENDENT_AMBULATORY_CARE_PROVIDER_SITE_OTHER): Payer: Medicaid Other | Admitting: Emergency Medicine

## 2012-08-20 ENCOUNTER — Encounter: Payer: Self-pay | Admitting: Emergency Medicine

## 2012-08-20 VITALS — BP 118/84 | HR 84 | Temp 98.2°F | Ht 63.0 in | Wt 146.8 lb

## 2012-08-20 DIAGNOSIS — J449 Chronic obstructive pulmonary disease, unspecified: Secondary | ICD-10-CM

## 2012-08-20 NOTE — Patient Instructions (Addendum)
Continue to use Spiriva once a day and Symbicort 2 puffs twice a day We will perform walking oximetry today Follow with Dr Delton Coombes next available with full pulmonary function testing

## 2012-08-20 NOTE — Progress Notes (Signed)
Subjective:    Patient ID: Cindy [name], female    DOB: May 17, 1956, 56 y.o.   MRN: [id number]  HPI 56 yo woman, smoker (40pk-yrs), hyperlipidemia. Followed by Dr Jeanice Lim and on Spiriva + Symbicort + ProAir prn, FEV119% predicted on prior PFT! She has had several exacerbations over the last year. Was first noted to be hypoxemic in 2010, is on O2 that she is using prn.  She has exertional wheeze, SOB. Coughs daily - productive white, thick.    Review of Systems  Constitutional: Positive for fever, chills and activity change. Negative for diaphoresis, appetite change, fatigue and unexpected weight change.  HENT: Positive for congestion, facial swelling and postnasal drip. Negative for hearing loss, ear pain, nosebleeds, sore throat, rhinorrhea, sneezing, mouth sores, trouble swallowing, neck pain, neck stiffness, dental problem, sinus pressure, tinnitus and ear discharge.   Eyes: Negative for visual disturbance.  Respiratory: Positive for cough, chest tightness, shortness of breath and wheezing. Negative for choking and stridor.   Cardiovascular: Positive for chest pain and palpitations. Negative for leg swelling.  Gastrointestinal: Negative for nausea, vomiting, abdominal pain, constipation, blood in stool and abdominal distention.  Genitourinary: Negative for difficulty urinating.  Musculoskeletal: Positive for back pain. Negative for myalgias, joint swelling, arthralgias and gait problem.  Skin: Positive for rash.  Neurological: Positive for dizziness, weakness, light-headedness and headaches. Negative for tremors, seizures, syncope, speech difficulty and numbness.  Hematological: Does not bruise/bleed easily.  Psychiatric/Behavioral: Negative for confusion, disturbed wake/sleep cycle and agitation. The patient is not nervous/anxious.     Past Medical History  Diagnosis Date  . COPD (chronic obstructive pulmonary disease)   . Shortness of breath   . Thyroid disease   . Compression  fracture of lumbar vertebra   . Hyperglycemia, drug-induced   . Hyperlipidemia   . Osteoporosis 2013  . Tobacco abuse   . Chronic respiratory failure On home 02 2-3L     Family History  Problem Relation Age of Onset  . Heart disease Mother   . Hypertension Sister   . Hypertension Brother      History   Social History  . Marital Status: Divorced    Spouse Name: N/A    Number of Children: 3  . Years of Education: N/A   Occupational History  . disabled    Social History Main Topics  . Smoking status: Current Everyday Smoker -- 1.0 packs/day    Types: Cigarettes  . Smokeless tobacco: Never Used  . Alcohol Use: No  . Drug Use: No  . Sexually Active: No   Other Topics Concern  . Not on file   Social History Narrative  . No narrative on file     Allergies  Allergen Reactions  . Doxycycline Other (See Comments)    Gave patient oral thrush  . Avelox (Moxifloxacin Hcl In Nacl) Itching and Rash     Outpatient Prescriptions Prior to Visit  Medication Sig Dispense Refill  . albuterol (PROVENTIL HFA;VENTOLIN HFA) 108 (90 BASE) MCG/ACT inhaler Inhale 2 puffs into the lungs every 4 (four) hours as needed for wheezing.  1 Inhaler  2  . albuterol (PROVENTIL) (2.5 MG/3ML) 0.083% nebulizer solution Take 3 mLs (2.5 mg total) by nebulization every 4 (four) hours as needed for wheezing.  75 mL  6  . alendronate (FOSAMAX) 70 MG tablet Take 1 tablet (70 mg total) by mouth every 7 (seven) days. Take with a full glass of water on an empty stomach.  4 tablet  11  .  aspirin EC 81 MG tablet Take 81 mg by mouth daily.        . budesonide-formoterol (SYMBICORT) 160-4.5 MCG/ACT inhaler Inhale 2 puffs into the lungs 2 (two) times daily.       . Calcium Carbonate-Vitamin D (CALCIUM 600/VITAMIN D) 600-400 MG-UNIT per tablet Take 1 tablet by mouth 2 (two) times daily.      . Multiple Vitamin (MULITIVITAMIN WITH MINERALS) TABS Take 1 tablet by mouth daily.       . pantoprazole (PROTONIX) 40 MG  tablet Take 40 mg by mouth daily.      . simvastatin (ZOCOR) 20 MG tablet Take 1 tablet (20 mg total) by mouth every evening.  30 tablet  3  . tiotropium (SPIRIVA HANDIHALER) 18 MCG inhalation capsule Place 1 capsule (18 mcg total) into inhaler and inhale daily.  30 capsule  2  . acetaminophen (TYLENOL) 325 MG tablet Take 2 tablets (650 mg total) by mouth every 6 (six) hours as needed (or Fever >/= 101).      Marland Kitchen albuterol-ipratropium (COMBIVENT) 18-103 MCG/ACT inhaler Inhale 2 puffs into the lungs 4 (four) times daily as needed for wheezing.  1 Inhaler  2  . predniSONE (DELTASONE) 10 MG tablet Take 40mg  by mouth x 5 days  20 tablet  0       Objective:   Physical Exam Filed Vitals:   08/20/12 1408  BP: 118/84  Pulse: 84  Temp: 98.2 F (36.8 C)   Gen: Pleasant, well-nourished, in no distress, very tangential, difficult history giver  ENT: No lesions,  mouth clear,  oropharynx clear, no postnasal drip  Neck: No JVD, no TMG, no carotid bruits  Lungs: No use of accessory muscles, B expiratory wheezes.   Cardiovascular: RRR, heart sounds normal, no murmur or gallops, no peripheral edema  Musculoskeletal: No deformities, no cyanosis or clubbing  Neuro: alert, non focal  Skin: Warm, no lesions or rashes      Assessment & Plan:

## 2012-08-20 NOTE — Assessment & Plan Note (Signed)
Poor insight into severe disease. Not wearing her O2 today. Poor understanding o fher meds.  - discussed importance of smoking cessation in detail - continue spiriva and symbicort - ProAir prn -Walking oximetry and encourage O2 compliance -full PFT

## 2012-08-20 NOTE — Telephone Encounter (Signed)
Patient came in

## 2012-08-23 ENCOUNTER — Encounter: Payer: Self-pay | Admitting: Family Medicine

## 2012-08-23 ENCOUNTER — Telehealth: Payer: Self-pay | Admitting: Family Medicine

## 2012-08-23 ENCOUNTER — Ambulatory Visit (INDEPENDENT_AMBULATORY_CARE_PROVIDER_SITE_OTHER): Payer: Medicaid Other | Admitting: Family Medicine

## 2012-08-23 VITALS — BP 118/70 | HR 86 | Resp 18 | Ht 63.0 in | Wt 144.1 lb

## 2012-08-23 DIAGNOSIS — T148XXA Other injury of unspecified body region, initial encounter: Secondary | ICD-10-CM

## 2012-08-23 DIAGNOSIS — J449 Chronic obstructive pulmonary disease, unspecified: Secondary | ICD-10-CM

## 2012-08-23 DIAGNOSIS — W57XXXA Bitten or stung by nonvenomous insect and other nonvenomous arthropods, initial encounter: Secondary | ICD-10-CM

## 2012-08-23 MED ORDER — PERMETHRIN 5 % EX CREA: TOPICAL_CREAM | CUTANEOUS | Status: DC

## 2012-08-23 NOTE — Assessment & Plan Note (Addendum)
Multiple bite in scattered regions with generalized pruritis, will treat with permethrin, no signs of tick bite, no signs of superinfection

## 2012-08-23 NOTE — Assessment & Plan Note (Signed)
Reiterated her need to wear oxygen, and which inhalers and how to use them.

## 2012-08-23 NOTE — Progress Notes (Signed)
  Subjective:    Patient ID: Cindy [name], female    DOB: Aug 17, 1956, 56 y.o.   MRN: [id number]  HPI Patient presents with bug bites for the past week. She is itchy lesions on her back shoulders neck and into the scalp. She also has a few lesions on her legs. She denies seeing any bed bugs. She denies being outside and being bitten by any other insects. She initially thought this was a drug reaction to her Proair COPD- seen by pulmonary, she has persistent cough, not wearing oxygen as directed, will have PFT set up on return visit to pulmonary   Review of Systems GEN- denies fatigue, fever, weight loss,weakness, recent illness HEENT- denies eye drainage, change in vision, nasal discharge, CVS- denies chest pain, palpitations RESP- denies SOB, +cough, wheeze ABD- denies N/V, change in stools, abd pain GU- denies dysuria, hematuria, dribbling, incontinence MSK- denies joint pain, muscle aches, injury Neuro- denies headache, dizziness, syncope, seizure activity        Objective:   Physical Exam GEN- NAD, alert and oriented x3,  CVS- RRR no murmur RESP- scattered wheeze, no rhonchi, normal WOB  EXT- No edema Pulses- Radial 2+ Skin- scattered bug bites on upper back, shoulder arm, one crusted lesion on lower scalpline, 2 lesions on legs, no drainage from lesions, + erythema, + excoriations , no urticaria       Assessment & Plan:

## 2012-08-23 NOTE — Telephone Encounter (Signed)
She can try to mow the yard riding, if she is short of breath then stop,. Not allowed if hot outside She can wear oxygen outdoors as well

## 2012-08-23 NOTE — Patient Instructions (Signed)
Use the proair on only as needed Use the cream for bug bites  Use cortisone afterwards  F/U in sept as previously scheduled

## 2012-08-24 ENCOUNTER — Telehealth: Payer: Self-pay | Admitting: Family Medicine

## 2012-08-24 NOTE — Telephone Encounter (Signed)
Please call her back

## 2012-08-24 NOTE — Telephone Encounter (Signed)
Patient is aware 

## 2012-08-25 NOTE — Telephone Encounter (Signed)
Returned pt call  

## 2012-08-25 NOTE — Telephone Encounter (Signed)
Called and reiterated message.

## 2012-09-07 ENCOUNTER — Ambulatory Visit (INDEPENDENT_AMBULATORY_CARE_PROVIDER_SITE_OTHER): Payer: Medicaid Other | Admitting: Family Medicine

## 2012-09-07 ENCOUNTER — Encounter: Payer: Self-pay | Admitting: Family Medicine

## 2012-09-07 VITALS — BP 126/74 | HR 82 | Resp 18 | Ht 63.0 in | Wt 145.0 lb

## 2012-09-07 DIAGNOSIS — J441 Chronic obstructive pulmonary disease with (acute) exacerbation: Secondary | ICD-10-CM

## 2012-09-07 DIAGNOSIS — L03211 Cellulitis of face: Secondary | ICD-10-CM

## 2012-09-07 DIAGNOSIS — L0201 Cutaneous abscess of face: Secondary | ICD-10-CM

## 2012-09-07 MED ORDER — SIMVASTATIN 20 MG PO TABS: 20.0000 mg | ORAL_TABLET | Freq: Every evening | ORAL | Status: DC

## 2012-09-07 MED ORDER — BUDESONIDE-FORMOTEROL FUMARATE 160-4.5 MCG/ACT IN AERO: 2.0000 | INHALATION_SPRAY | Freq: Two times a day (BID) | RESPIRATORY_TRACT | Status: DC

## 2012-09-07 MED ORDER — PANTOPRAZOLE SODIUM 40 MG PO TBEC: 40.0000 mg | DELAYED_RELEASE_TABLET | Freq: Every day | ORAL | Status: DC

## 2012-09-07 MED ORDER — AMOXICILLIN-POT CLAVULANATE 875-125 MG PO TABS: 1.0000 | ORAL_TABLET | Freq: Two times a day (BID) | ORAL | Status: DC

## 2012-09-07 MED ORDER — PREDNISONE 10 MG PO TABS: ORAL_TABLET | ORAL | Status: DC

## 2012-09-07 NOTE — Patient Instructions (Signed)
Take the antibiotics and prednisone Use your nebulizer as needed I will add water to your oxygen Keep previous f/u appt Call if you don't improve

## 2012-09-08 ENCOUNTER — Encounter: Payer: Self-pay | Admitting: Family Medicine

## 2012-09-08 DIAGNOSIS — L03211 Cellulitis of face: Secondary | ICD-10-CM | POA: Insufficient documentation

## 2012-09-08 NOTE — Assessment & Plan Note (Signed)
Will treat for facial cellulitis based on exam, no injury per report, no ocular invovlement noted, she had had swelling before which resolved within 24 hours in the past.

## 2012-09-08 NOTE — Progress Notes (Signed)
  Subjective:    Patient ID: Cindy [name], female    DOB: 12-04-1956, 56 y.o.   MRN: [id number]  HPI   Increase cough with production and SOB for the past week, yesterday she noticed swelling and redness beneath her right eye, that has not improved. Denies fever, or chills, nose feels congested with her oxygen. Using albuterol and nebs as needed, but feels run down.    Review of Systems   GEN- denies fatigue, fever, weight loss,weakness, recent illness HEENT- denies eye drainage, change in vision, nasal discharge, CVS- denies chest pain, palpitations RESP-+ SOB,+ cough, +wheeze ABD- denies N/V, change in stools, abd pain GU- denies dysuria, hematuria, dribbling, incontinence MSK- denies joint pain, muscle aches, injury Neuro- denies headache, dizziness, syncope, seizure activity      Objective:   Physical Exam GEN- NAD, alert and oriented x3,  HEENT- PERRL, EOMI, Right eye- swelling beneath eye, mild fluctuance, conjunctiva non injected, no discharge from eye, nares scab in left nares, no active bleeding, oropharynx clear, MMM , sinus NT , TM clear bilat, non injected CVS- RRR no murmur ABD-NABS,soft,NT,ND  RESP- scattered wheeze, + rhonchi right side, increased WOB with exertion, comfortable with oxygen, no retractions  EXT- No edema Pulses- Radial 2+       Assessment & Plan:

## 2012-09-08 NOTE — Assessment & Plan Note (Signed)
She is early into exacerbation, will start prednisone, antibiotics - she has done well with augmentin in the past, continue inhalers and oxygen, advised to use nebs

## 2012-09-16 ENCOUNTER — Encounter: Payer: Self-pay | Admitting: Family Medicine

## 2012-09-16 ENCOUNTER — Ambulatory Visit (INDEPENDENT_AMBULATORY_CARE_PROVIDER_SITE_OTHER): Payer: Medicaid Other | Admitting: Family Medicine

## 2012-09-16 ENCOUNTER — Telehealth: Payer: Self-pay | Admitting: Family Medicine

## 2012-09-16 VITALS — BP 112/80 | HR 90 | Resp 16 | Ht 63.0 in | Wt 144.0 lb

## 2012-09-16 DIAGNOSIS — IMO0001 Reserved for inherently not codable concepts without codable children: Secondary | ICD-10-CM

## 2012-09-16 DIAGNOSIS — F17200 Nicotine dependence, unspecified, uncomplicated: Secondary | ICD-10-CM

## 2012-09-16 DIAGNOSIS — J449 Chronic obstructive pulmonary disease, unspecified: Secondary | ICD-10-CM

## 2012-09-16 DIAGNOSIS — E785 Hyperlipidemia, unspecified: Secondary | ICD-10-CM

## 2012-09-16 DIAGNOSIS — M791 Myalgia, unspecified site: Secondary | ICD-10-CM

## 2012-09-16 DIAGNOSIS — Z720 Tobacco use: Secondary | ICD-10-CM

## 2012-09-16 NOTE — Assessment & Plan Note (Addendum)
Trial off statin for leg pains, check FLP

## 2012-09-16 NOTE — Progress Notes (Signed)
  Subjective:    Patient ID: Cindy [name], female    DOB: 1956-09-03, 56 y.o.   MRN: [id number]  HPI  Pt here to f/u chronic medical problems and recent COPD exacerbation. She had chest tightness earlier this week and severe fatigue, now improving. Wearing oxygen during the day as well. Needs new nebulizer machine, s/p antibiotics and prednisone with overall improvement. Eye swelling also resolved. Continues to have muscle aches in both arms and legs   Review of Systems    GEN- + fatigue, fever, weight loss,weakness, recent illness HEENT- denies eye drainage, change in vision, nasal discharge, CVS- denies chest pain, palpitations RESP- denies SOB, +cough, wheeze ABD- denies N/V, change in stools, abd pain GU- denies dysuria, hematuria, dribbling, incontinence MSK- denies joint pain, +muscle aches, injury Neuro- denies headache, dizziness, syncope, seizure activity      Objective:   Physical Exam  GEN- NAD, alert and oriented x3,  HEENT- PERRL, EOMI,  oropharynx clear, MMM CVS- RRR no murmur RESP- scattered wheeze, no rhonchi, no retractions, wearing oxygen, appears comfortable Pulses- Radial 2+      Assessment & Plan:

## 2012-09-16 NOTE — Assessment & Plan Note (Signed)
Unchanged, reiterated need to quit

## 2012-09-16 NOTE — Telephone Encounter (Signed)
Needs order for new neb machine and tubing.  Needs the water attachment that goes on the oxygen so her nose won't get so dry. Also she wants to get 3 new portable oxygen tanks and for them to pick up the 3 empty tanks. Has called 3 times, needs today please

## 2012-09-16 NOTE — Telephone Encounter (Signed)
Orders sent

## 2012-09-16 NOTE — Assessment & Plan Note (Signed)
Flu shot given F/U pulmonary for lung testing Needs to quit smoking New neb machine sent

## 2012-09-16 NOTE — Patient Instructions (Addendum)
Continue your current medications You need to quit smoking! Stop the cholesterol pills for 2 weeks, then call me and tell me how legs are doing  Nebulizer machine to be ordered with mask  Get the cholesterol drawn  Flu shot given  F/U 3 months

## 2012-09-16 NOTE — Assessment & Plan Note (Signed)
Trial off statin drug, does not sound like typical cramps and aches associated with statin, but will given trial

## 2012-09-18 ENCOUNTER — Encounter (HOSPITAL_COMMUNITY): Payer: Self-pay | Admitting: *Deleted

## 2012-09-18 ENCOUNTER — Emergency Department (HOSPITAL_COMMUNITY): Payer: Medicaid Other

## 2012-09-18 ENCOUNTER — Emergency Department (HOSPITAL_COMMUNITY)
Admission: EM | Admit: 2012-09-18 | Discharge: 2012-09-19 | Disposition: A | Discharge status: Home / Self Care | Payer: Medicaid Other | Attending: Emergency Medicine | Admitting: Emergency Medicine

## 2012-09-18 DIAGNOSIS — J441 Chronic obstructive pulmonary disease with (acute) exacerbation: Secondary | ICD-10-CM | POA: Insufficient documentation

## 2012-09-18 DIAGNOSIS — R209 Unspecified disturbances of skin sensation: Secondary | ICD-10-CM | POA: Insufficient documentation

## 2012-09-18 DIAGNOSIS — Z7982 Long term (current) use of aspirin: Secondary | ICD-10-CM | POA: Insufficient documentation

## 2012-09-18 DIAGNOSIS — Z79899 Other long term (current) drug therapy: Secondary | ICD-10-CM | POA: Insufficient documentation

## 2012-09-18 LAB — BASIC METABOLIC PANEL
BUN: 8 mg/dL (ref 6–23)
Chloride: 100 mEq/L (ref 96–112)
GFR calc Af Amer: >90 mL/min (ref >90)
Glucose, Bld: 114 mg/dL — ABNORMAL HIGH (ref 70–99)
Potassium: 3.9 mEq/L (ref 3.5–5.1)

## 2012-09-18 LAB — CBC WITH DIFFERENTIAL/PLATELET
Hemoglobin: 14 g/dL (ref 12.0–15.0)
Lymphs Abs: 3.4 10*3/uL (ref 0.7–4.0)
Monocytes Relative: 8 % (ref 3–12)
Neutro Abs: 14.6 10*3/uL — ABNORMAL HIGH (ref 1.7–7.7)
Neutrophils Relative %: 73 % (ref 43–77)
RBC: 4.55 MIL/uL (ref 3.87–5.11)

## 2012-09-18 MED ORDER — METHYLPREDNISOLONE SODIUM SUCC 125 MG IJ SOLR
125.0000 mg | Freq: Once | INTRAMUSCULAR | Status: AC
  Administered 2012-09-18: 125 mg via INTRAVENOUS
  Filled 2012-09-18: qty 2

## 2012-09-18 MED ORDER — ONDANSETRON HCL 4 MG/2ML IJ SOLN: 4.0000 mg | Freq: Once | INTRAMUSCULAR | Status: DC

## 2012-09-18 MED ORDER — ALBUTEROL SULFATE (5 MG/ML) 0.5% IN NEBU
2.5000 mg | INHALATION_SOLUTION | Freq: Once | RESPIRATORY_TRACT | Status: AC
  Administered 2012-09-18: 2.5 mg via RESPIRATORY_TRACT
  Filled 2012-09-18: qty 0.5

## 2012-09-18 MED ORDER — IPRATROPIUM BROMIDE 0.02 % IN SOLN
0.5000 mg | Freq: Once | RESPIRATORY_TRACT | Status: AC
  Administered 2012-09-18: 0.5 mg via RESPIRATORY_TRACT
  Filled 2012-09-18: qty 2.5

## 2012-09-18 MED ORDER — SODIUM CHLORIDE 0.9 % IV BOLUS (SEPSIS)
250.0000 mL | Freq: Once | INTRAVENOUS | Status: AC
  Administered 2012-09-18: 250 mL via INTRAVENOUS

## 2012-09-18 MED ORDER — SODIUM CHLORIDE 0.9 % IV SOLN: INTRAVENOUS | Status: DC

## 2012-09-18 NOTE — ED Notes (Addendum)
Pt c/o sob, cough, congestion, fever, and chills x 2 days. Pt had a flu shot on Sept. 19th. Pt also states she was treated with prednisone and augmentin earlier this month with no relief.

## 2012-09-18 NOTE — ED Notes (Addendum)
Pt reports cough and chest congestion x2 days.  Denies relief with home medications.  Expiratory wheezing heard upon auscultation.  Tight and diminished throughout.

## 2012-09-18 NOTE — ED Provider Notes (Signed)
History   This chart was scribed for Shelda Jakes, MD scribed by Magnus Sinning. The patient was seen in room APA11/[facility]11 at 21:11   CSN: [id number]  Arrival date & [name] 09/18/12  2039    Chief Complaint  Patient presents with  . Shortness of Breath  . Cough  . Nasal Congestion  . Fever  . Chills    (Consider location/radiation/quality/duration/timing/severity/associated sxs/prior treatment) HPI Cindy [name] is a 56 y.o. female who presents to the Emergency Department complaining of gradually worsening moderate SOB onset 5 days with associated chest tightness,fever of 101.7 at home, CP, generalized aches in bilateral arms Cindy legs, numbness of bilateral toes Cindy wheezing. Patient is on 2 L oxygen at home Cindy a portable 2 L oxygen. Cindy explains that Cindy uses Symbicort twice a day, Proair every 4 hours, albuterol inhaler as needed, Spiriva, Cindy simvastatin. Patient was seen 2 days ago at PCP. Patient notes hx of COPD.  PCP: Dr. Jeanice Lim Past Medical History  Diagnosis Date  . COPD (chronic obstructive pulmonary disease)   . Shortness of breath   . Thyroid disease   . Compression fracture of lumbar vertebra   . Hyperglycemia, drug-induced   . Hyperlipidemia   . Osteoporosis 2013  . Tobacco abuse   . Chronic respiratory failure On home 02 2-3L    Past Surgical History  Procedure Date  . Tubal ligation   . Cervical biopsy     Family History  Problem Relation Age of Onset  . Heart disease Mother   . Hypertension Sister   . Hypertension Brother     History  Substance Use Topics  . Smoking status: Current Every Day Smoker -- 1.0 packs/day    Types: Cigarettes  . Smokeless tobacco: Never Used  . Alcohol Use: No    Review of Systems  Constitutional: Positive for fever.  HENT: Negative for neck pain.   Eyes: Negative for visual disturbance.  Respiratory: Positive for chest tightness Cindy shortness of breath.   Gastrointestinal: Positive for nausea. Negative for  vomiting, abdominal pain Cindy diarrhea.  Genitourinary: Negative for dysuria.  Musculoskeletal: Positive for myalgias. Negative for back pain.  Skin: Negative for rash.  Neurological: Positive for numbness. Negative for headaches.  All other systems reviewed Cindy are negative.    Allergies  Doxycycline Cindy Avelox  Home Medications   Current Outpatient Rx  Name Route Sig Dispense Refill  . ACETAMINOPHEN 325 MG PO TABS Oral Take 2 tablets (650 mg total) by mouth every 6 (six) hours as needed (or Fever >/= 101).    . ALBUTEROL SULFATE HFA 108 (90 BASE) MCG/ACT IN AERS Inhalation Inhale 2 puffs into the lungs every 4 (four) hours as needed for wheezing. 1 Inhaler 2  . ALBUTEROL SULFATE (2.5 MG/3ML) 0.083% IN NEBU Nebulization Take 3 mLs (2.5 mg total) by nebulization every 4 (four) hours as needed for wheezing. 75 mL 6  . ALENDRONATE SODIUM 70 MG PO TABS Oral Take 70 mg by mouth every 7 (seven) days. Take with a full glass of water on an empty stomach.(wednesday)    . ASPIRIN EC 81 MG PO TBEC Oral Take 81 mg by mouth daily.      . BUDESONIDE-FORMOTEROL FUMARATE 160-4.5 MCG/ACT IN AERO Inhalation Inhale 2 puffs into the lungs 2 (two) times daily. 1 Inhaler 11  . CALCIUM CARBONATE-VITAMIN D 600-400 MG-UNIT PO TABS Oral Take 1 tablet by mouth 2 (two) times daily.    . ADULT MULTIVITAMIN W/MINERALS CH Oral  Take 1 tablet by mouth daily.     Marland Kitchen PANTOPRAZOLE SODIUM 40 MG PO TBEC Oral Take 1 tablet (40 mg total) by mouth daily. 90 tablet 1  . TIOTROPIUM BROMIDE MONOHYDRATE 18 MCG IN CAPS Inhalation Cindy [name] 1 capsule (18 mcg total) into inhaler Cindy inhale daily. 30 capsule 2  . AMOXICILLIN-POT CLAVULANATE 875-125 MG PO TABS Oral Take 1 tablet by mouth every 12 (twelve) hours. 14 tablet 0  . PREDNISONE 10 MG PO TABS Oral Take 4 tablets (40 mg total) by mouth daily. 20 tablet 0  . SIMVASTATIN 20 MG PO TABS Oral Take 1 tablet (20 mg total) by mouth every evening. 90 tablet 1    Dispense 90 day supply     BP 122/95  Pulse 134  Temp 99.6 F (37.6 C) (Oral)  Resp 22  Wt 144 lb (65.318 kg)  SpO2 93%  Physical Exam  Nursing note Cindy vitals reviewed. Constitutional: Cindy [name], Cindy [name], Cindy [name]. Cindy appears well-developed Cindy well-nourished. No distress.  HENT:  Head: Normocephalic Cindy atraumatic.  Mouth/Throat: Oropharynx is clear Cindy moist.  Eyes: Conjunctivae normal Cindy EOM are normal.  Neck: Neck supple. No tracheal deviation present.  Cardiovascular: Normal rate.   No murmur heard. Pulmonary/Chest: Effort normal. No respiratory distress. Cindy has wheezes.       Wheezing throughout bilaterally   Abdominal: Soft. Bowel sounds are normal. Cindy exhibits no distension. There is no tenderness.  Musculoskeletal: Normal range of motion. Cindy exhibits no edema.  Neurological: Cindy is alert Cindy oriented to [name], Cindy [name], Cindy [name]. No cranial nerve deficit or sensory deficit.  Skin: Skin is dry.  Psychiatric: Cindy has a normal mood Cindy affect. Her behavior is normal.    ED Course  Procedures (including critical care [name]) DIAGNOSTIC STUDIES: Oxygen Saturation is 93% on 2 L  Albers, adequate by my interpretation.    COORDINATION OF CARE: 21:15: Physical exam performed.  Medication Orders 21:45: sodium chloride 0.9 % bolus 250 mL Once             ondansetron (ZOFRAN) injection 4 mg Once             albuterol (PROVENTIL) (5 MG/ML) 0.5% nebulizer solution 2.5 mg Once             ipratropium (ATROVENT) nebulizer solution 0.5 mg Once             methylPREDNISolone sodium succinate (SOLU-MEDROL) 125 mg/2 mL injection 125 mg Once  Results for orders placed during the hospital encounter of 09/18/12  CBC WITH DIFFERENTIAL      Component Value Range   WBC 19.9 (*) 4.0 - 10.5 K/uL   RBC 4.55  3.87 - 5.11 MIL/uL   Hemoglobin 14.0  12.0 - 15.0 g/dL   HCT 16.1  09.6 - 04.5 %   MCV 92.7  78.0 - 100.0 fL   MCH 30.8  26.0 - 34.0 pg   MCHC 33.2  30.0 - 36.0 g/dL   RDW 40.9  81.1 - 91.4  %   Platelets 234  150 - 400 K/uL   Neutrophils Relative 73  43 - 77 %   Neutro Abs 14.6 (*) 1.7 - 7.7 K/uL   Lymphocytes Relative 17  12 - 46 %   Lymphs Abs 3.4  0.7 - 4.0 K/uL   Monocytes Relative 8  3 - 12 %   Monocytes Absolute 1.6 (*) 0.1 - 1.0 K/uL   Eosinophils Relative 1  0 - 5 %  Eosinophils Absolute 0.2  0.0 - 0.7 K/uL   Basophils Relative 1  0 - 1 %   Basophils Absolute 0.1  0.0 - 0.1 K/uL  BASIC METABOLIC PANEL      Component Value Range   Sodium 142  135 - 145 mEq/L   Potassium 3.9  3.5 - 5.1 mEq/L   Chloride 100  96 - 112 mEq/L   CO2 32  19 - 32 mEq/L   Glucose, Bld 114 (*) 70 - 99 mg/dL   BUN 8  6 - 23 mg/dL   Creatinine, Ser 4.09  0.50 - 1.10 mg/dL   Calcium 81.1 (*) 8.4 - 10.5 mg/dL   GFR calc non Af Amer 80 (*) >90 mL/min   GFR calc Af Amer >90  >90 mL/min   Dg Chest Portable 1 View  09/18/2012  *RADIOLOGY REPORT*  Clinical Data: Shortness of breath, chest pain, fever  PORTABLE CHEST - 1 VIEW  Comparison: 06/27/2012  Findings: Chronic interstitial markings/emphysematous changes. No pleural effusion or pneumothorax.  Cardiomediastinal silhouette is within normal limits.  IMPRESSION: No evidence of acute cardiopulmonary disease.  Chronic interstitial markings/emphysematous changes.   Original Report Authenticated By: Charline Bills, M.D.      1. COPD exacerbation       MDM   Exacerbation of COPD with a leukocytosis. Chest x-ray without evidence of pneumonia. All wheezing resolved with albuterol Atrovent nebulizers x2 here patient also given a dose of Solu-Medrol. Patient most likely ran into difficulties patient has recently stopped a course of prednisone a few days ago. Patient feels much better Cindy normally is on 2 L of oxygen at all times Cindy'll continue that. Return for any new or worse breathing. Patient is to use her albuterol inhaler 2 puffs every 6 hours for the next week. Cindy'll followup with her primary care Dr. Hiram Comber in the next few days.   I  personally performed the services described in this documentation, which was scribed in my presence. The recorded information has been reviewed Cindy considered.          Shelda Jakes, MD 09/19/12 0010

## 2012-09-18 NOTE — ED Notes (Signed)
Pt reporting improvement in nausea, declines Zofran at this time.

## 2012-09-18 NOTE — ED Notes (Signed)
At nurse's approval - patient given ginger ale.

## 2012-09-19 MED ORDER — AMOXICILLIN-POT CLAVULANATE 875-125 MG PO TABS: 1.0000 | ORAL_TABLET | Freq: Two times a day (BID) | ORAL | Status: DC

## 2012-09-19 MED ORDER — PREDNISONE 10 MG PO TABS: 40.0000 mg | ORAL_TABLET | Freq: Every day | ORAL | Status: DC

## 2012-09-23 ENCOUNTER — Ambulatory Visit (INDEPENDENT_AMBULATORY_CARE_PROVIDER_SITE_OTHER): Payer: Medicaid Other | Admitting: Family Medicine

## 2012-09-23 ENCOUNTER — Encounter: Payer: Self-pay | Admitting: Family Medicine

## 2012-09-23 VITALS — BP 114/76 | HR 93 | Resp 16 | Ht 63.0 in | Wt 146.8 lb

## 2012-09-23 DIAGNOSIS — J441 Chronic obstructive pulmonary disease with (acute) exacerbation: Secondary | ICD-10-CM

## 2012-09-23 MED ORDER — PREDNISONE 10 MG PO TABS: ORAL_TABLET | ORAL | Status: DC

## 2012-09-23 NOTE — Progress Notes (Signed)
  Subjective:    Patient ID: Cindy [name], female    DOB: Jun 15, 1956, 56 y.o.   MRN: [id number]  HPI Patient is here to followup ER visit for COPD exacerbation. She was treated on 9/10 with Augmentin and prednisone secondary to COPD exacerbation and facial cellulitis is improved however a few days later she presented back to the ER with shortness of breath and chest tightness. She was prescribed prednisone 40 mg x5 days status post Solu-Medrol load as well as Augmentin for 7 days. She today she feels much better regarding her breathing. She is using 2 L of oxygen most of the day. She continues to smoke but has cut back some. Chest x-ray was negative for pneumonia or acute infiltrate.   Review of Systems     GEN- + fatigue, fever, weight loss,weakness, recent illness HEENT- denies eye drainage, change in vision, nasal discharge, CVS- denies chest pain, palpitations RESP- denies SOB, +cough, +wheeze ABD- denies N/V, change in stools, abd pain    Objective:   Physical Exam GEN- NAD, alert and oriented x3 HEENT- PERRL, EOMI,  pink conjunctiva, MMM, oropharynx clear Neck- Supple, no LAD CVS- RRR, no murmur RESP-scattered wheeze, increase WOB with exertion, no retractions, fair air movement, course RHonchi, right base EXT- No edema Pulses- Radial 2+        Assessment & Plan:

## 2012-09-23 NOTE — Assessment & Plan Note (Signed)
Status post another exacerbation of COPD. She is still on prednisone. I will give her a longer taper off her prednisone to hopefully prevent recurrence. She will also complete the Augmentin prescribed. She is allergy to doxycycline and floroquinolones. She continues to smoke. I have also advised her to wear her oxygen throughout the day and continue her nebulizer treatments. She is followup with pulmonary on Monday for PFTs and visit.  I will defer to them for any change in her management

## 2012-09-23 NOTE — Patient Instructions (Signed)
Starting tomorrow take 1 tablet twice a day of prednisone x 3 days Then 1 tablet daily for 2 days then stop Finish antibiotics FOllow up with lung doctor on Monday Continue to work on the smoking!

## 2012-09-24 ENCOUNTER — Telehealth: Payer: Self-pay | Admitting: Family Medicine

## 2012-09-24 MED ORDER — BENZONATATE 100 MG PO CAPS: 100.0000 mg | ORAL_CAPSULE | Freq: Three times a day (TID) | ORAL | Status: DC | PRN

## 2012-09-24 NOTE — Telephone Encounter (Signed)
Please advise 

## 2012-09-24 NOTE — Telephone Encounter (Signed)
Tessalon sent

## 2012-09-27 ENCOUNTER — Ambulatory Visit (INDEPENDENT_AMBULATORY_CARE_PROVIDER_SITE_OTHER): Payer: Medicaid Other | Admitting: Emergency Medicine

## 2012-09-27 ENCOUNTER — Encounter: Payer: Self-pay | Admitting: Emergency Medicine

## 2012-09-27 VITALS — BP 110/80 | HR 85 | Temp 98.2°F | Ht 63.0 in | Wt 144.0 lb

## 2012-09-27 DIAGNOSIS — J449 Chronic obstructive pulmonary disease, unspecified: Secondary | ICD-10-CM

## 2012-09-27 LAB — PULMONARY FUNCTION TEST

## 2012-09-27 NOTE — Patient Instructions (Addendum)
Continue your Spiriva and Symbicort YOU MUST STOP SMOKING. Please call our office or Dr Jeanice Lim when you are ready to set a quit date. Blood work today Follow with Dr Delton Coombes in 3 months or sooner if you have any problems.

## 2012-09-27 NOTE — Assessment & Plan Note (Signed)
  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   A-1AT testing today Smoking cessation spiriva + symbicort rov 3 months Discussed possible transplantr lidst if she could stop cigarettes

## 2012-09-27 NOTE — Progress Notes (Signed)
  Subjective:    Patient ID: Cindy [name], female    DOB: 1956/09/05, 56 y.o.   MRN: [id number]  HPI 56 yo woman, smoker (40pk-yrs), hyperlipidemia. Followed by Dr Jeanice Lim and on Spiriva + Symbicort + ProAir prn, FEV1 19% predicted on prior PFT! She has had several exacerbations over the last year. Was first noted to be hypoxemic in 2010, is on O2 that she is using prn.  She has exertional wheeze, SOB. Coughs daily - productive white, thick.   ROV 09/27/12 -- follows up COPD, hypoxemia, on Spiriva/Symbicort. 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 Since last visit has been by Dr Jeanice Lim and in the ED, treated with pred for chest tightness.  Uses albuterol every 6 hours. Hasn;t had a-1 AT yet to my knowledge.    PULMONARY FUNCTON TEST 09/27/2012  FVC 2.31  FEV1 .87  FEV1/FVC 37.7  FVC  % Predicted 76  FEV % Predicted 38  FeF 25-75 .3  FeF 25-75 % Predicted 2.65       Objective:   Physical Exam Filed Vitals:   09/27/12 1125  BP: 110/80  Pulse: 85  Temp: 98.2 F (36.8 C)   Gen: Pleasant, well-nourished, in no distress, very tangential, difficult history giver  ENT: No lesions,  mouth clear,  oropharynx clear, no postnasal drip  Neck: No JVD, no TMG, no carotid bruits  Lungs: No use of accessory muscles, B rhonchi, improved wheezing compared with last visit  Cardiovascular: RRR, heart sounds normal, no murmur or gallops, no peripheral edema  Musculoskeletal: No deformities, no cyanosis or clubbing  Neuro: alert, non focal  Skin: Warm, no lesions or rashes     Assessment & Plan:  C O P D  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   A-1AT testing today Smoking cessation spiriva + symbicort rov 3 months Discussed possible transplantr lidst if she could stop cigarettes

## 2012-09-27 NOTE — Progress Notes (Signed)
PFT done today. 

## 2012-10-11 ENCOUNTER — Telehealth: Payer: Self-pay

## 2012-10-11 NOTE — Telephone Encounter (Signed)
I received her lung test, she has bad COPD and needs to use the medications just like the lung doctor prescribed. She needs to use the oxygen as much as possible for her breathing It is okay to restart cholesterol medication If she has questions about her lung test have her call the lung doctor for details

## 2012-10-13 NOTE — Telephone Encounter (Signed)
Patient states that she noticed some vaginal bleeding this morning when she wiped after going to the bathroom.  Will monitor for now and call back if this continues.

## 2012-10-22 ENCOUNTER — Telehealth: Payer: Self-pay | Admitting: Family Medicine

## 2012-10-22 NOTE — Telephone Encounter (Signed)
Patient aware. She was upset. Said her eye was red where it was supposed to be white and I told her that I put that in the message but no antibiotic drops could be prescribed without an appt. Said she could come in next week or if it got worse over the weekend to go to the ED.

## 2012-10-22 NOTE — Telephone Encounter (Signed)
Wants some eye drops sent in. Right eye is irritated. Red under her pupil in the white part of her eye. Wants to know what she needs to do for it..Thinks she got trash in it yesterday and she woke up with it red today

## 2012-10-22 NOTE — Telephone Encounter (Signed)
She needs to flush eye with water, she can use [employer] baby shampoo in eye, this will not sting. If she starts having pus coming out she needs to make an appt. I do not recommend any eye drops right now

## 2012-10-25 ENCOUNTER — Other Ambulatory Visit: Payer: Self-pay | Admitting: Family Medicine

## 2012-10-26 ENCOUNTER — Encounter: Payer: Self-pay | Admitting: Emergency Medicine

## 2012-11-09 ENCOUNTER — Ambulatory Visit (INDEPENDENT_AMBULATORY_CARE_PROVIDER_SITE_OTHER): Payer: Medicaid Other | Admitting: Family Medicine

## 2012-11-09 ENCOUNTER — Encounter: Payer: Self-pay | Admitting: Family Medicine

## 2012-11-09 VITALS — BP 120/80 | HR 99 | Temp 98.7°F | Resp 17 | Ht 63.0 in | Wt 144.4 lb

## 2012-11-09 DIAGNOSIS — J441 Chronic obstructive pulmonary disease with (acute) exacerbation: Secondary | ICD-10-CM

## 2012-11-09 MED ORDER — METHYLPREDNISOLONE SODIUM SUCC 125 MG IJ SOLR
125.0000 mg | Freq: Once | INTRAMUSCULAR | Status: AC
  Administered 2012-11-09: 125 mg via INTRAMUSCULAR

## 2012-11-09 MED ORDER — ALBUTEROL SULFATE (2.5 MG/3ML) 0.083% IN NEBU
2.5000 mg | INHALATION_SOLUTION | Freq: Once | RESPIRATORY_TRACT | Status: AC
  Administered 2012-11-09: 2.5 mg via RESPIRATORY_TRACT

## 2012-11-09 MED ORDER — AMOXICILLIN-POT CLAVULANATE 875-125 MG PO TABS: 1.0000 | ORAL_TABLET | Freq: Two times a day (BID) | ORAL | Status: DC

## 2012-11-09 MED ORDER — IPRATROPIUM BROMIDE 0.02 % IN SOLN
0.5000 mg | Freq: Once | RESPIRATORY_TRACT | Status: AC
  Administered 2012-11-09: 0.5 mg via RESPIRATORY_TRACT

## 2012-11-09 MED ORDER — PREDNISONE 10 MG PO TABS: ORAL_TABLET | ORAL | Status: DC

## 2012-11-09 MED ORDER — ALBUTEROL SULFATE (2.5 MG/3ML) 0.083% IN NEBU: 2.5000 mg | INHALATION_SOLUTION | RESPIRATORY_TRACT | Status: DC | PRN

## 2012-11-09 NOTE — Patient Instructions (Signed)
For your breathing - start the prednisone follow instructions 4 tablets for 3 days, 2 tablets 3 days, 1 tablet 3 days  Antibiotics start today Continue nebulizer treatments Wear oxygen  F/U Thursday for Recheck

## 2012-11-09 NOTE — Progress Notes (Signed)
  Subjective:    Patient ID: Cindy [name], female    DOB: 10/25/1956, 56 y.o.   MRN: [id number]  HPI  Patient presents with cough shortness of breath and wheezing and worsened over the past week. Her cough is productive of thick green sputum. She's been using over-the-counter Tussend as well as Sudafed and Mucinex. She's not want to come in last week and she thought she can self treat. She's currently wearing her oxygen. She denies fever, nausea, vomiting, diarrhea. She continues to smoke.  Review of Systems  GEN- denies fatigue, fever, weight loss,weakness, recent illness HEENT- denies eye drainage, change in vision, nasal discharge, CVS- denies chest pain, palpitations RESP- + SOB, +cough, +wheeze ABD- denies N/V, change in stools, abd pain Neuro- denies headache, dizziness, syncope, seizure activity      Objective:   Physical Exam GEN- NAD, alert and oriented x3, oxygen Sat 90% at rest off oxygen HEENT- PERRL, EOMI,  pink conjunctiva, MMM, oropharynx clear, + clear rhinorrhea Neck- Supple, no LAD CVS- RRR, no murmur RESP-scattered wheeze, increase WOB + mild retractions, fair air movement, course RHonchi,harsh cough EXT- No edema Pulses- Radial 2+        Assessment & Plan:

## 2012-11-09 NOTE — Assessment & Plan Note (Addendum)
Very severe COPD by evidence of PFT and pulmonary evaluation She is in acute exacerbation Given albuterol/atrovent and solumedrol 125mg  in office Antibiotics,steroid taper, unfortunately she continues to smoke

## 2012-11-11 ENCOUNTER — Telehealth: Payer: Self-pay

## 2012-11-11 ENCOUNTER — Encounter: Payer: Self-pay | Admitting: Family Medicine

## 2012-11-11 ENCOUNTER — Ambulatory Visit (INDEPENDENT_AMBULATORY_CARE_PROVIDER_SITE_OTHER): Payer: Medicaid Other | Admitting: Family Medicine

## 2012-11-11 ENCOUNTER — Telehealth: Payer: Self-pay | Admitting: Family Medicine

## 2012-11-11 VITALS — BP 120/80 | HR 88 | Resp 15 | Ht 63.0 in | Wt 144.0 lb

## 2012-11-11 DIAGNOSIS — J441 Chronic obstructive pulmonary disease with (acute) exacerbation: Secondary | ICD-10-CM

## 2012-11-11 MED ORDER — ACETAMINOPHEN-CODEINE 120-12 MG/5ML PO SOLN: 5.0000 mL | Freq: Four times a day (QID) | ORAL | Status: DC | PRN

## 2012-11-11 NOTE — Telephone Encounter (Signed)
This has been faxed twice.  

## 2012-11-11 NOTE — Telephone Encounter (Signed)
Said she wanted the apothecary syrup cancelled because it was just as expensive as the tylenol #3. Wants the tylenol 3 called into [facility]

## 2012-11-11 NOTE — Patient Instructions (Signed)
We will call about cough medicine  Continue all medications Return or go to ER if you do not improve  F/U January

## 2012-11-11 NOTE — Assessment & Plan Note (Signed)
Recheck exam improved Given cough syrup  Continue meds Continue oxygen

## 2012-11-11 NOTE — Telephone Encounter (Signed)
Cough med sent.

## 2012-11-11 NOTE — Progress Notes (Signed)
  Subjective:    Patient ID: Cindy [name], female    DOB: 10/22/1956, 56 y.o.   MRN: [id number]  HPI Patient here to followup COPD exacerbation. She was seen 2 days ago given IM Solu-Medrol nebulizer treatment in office. Start her on prednisone taper and Augmentin. She's breathing much better today she is using her oxygen in her nebulizer 4 hours. She is off her medications. She states that her cough is still very severe she would like cough medicine. She states that the muscle aches in her legs and twitching has restarted since she's been on her cholesterol medicine.  Review of Systems   GEN- denies fatigue, fever, weight loss,weakness, recent illness HEENT- denies eye drainage, change in vision, nasal discharge, CVS- denies chest pain, palpitations RESP- + SOB, +cough, +wheeze ABD- denies N/V, change in stools, abd pain Neuro- denies headache, dizziness, syncope, seizure activity     Objective:   Physical Exam GEN- NAD, alert and oriented x3,  HEENT, MMM, oropharynx clear,  CVS- RRR, no murmur RESP-scattered wheeze,normal WOB,no retractions, improved air movement, course RHonchi,harsh cough EXT- No edema Pulses- Radial 2+       Assessment & Plan:

## 2012-11-29 ENCOUNTER — Encounter: Payer: Self-pay | Admitting: Family Medicine

## 2012-11-29 ENCOUNTER — Telehealth: Payer: Self-pay | Admitting: Family Medicine

## 2012-11-29 ENCOUNTER — Ambulatory Visit (INDEPENDENT_AMBULATORY_CARE_PROVIDER_SITE_OTHER): Payer: Medicaid Other | Admitting: Family Medicine

## 2012-11-29 VITALS — BP 126/74 | HR 120 | Resp 20 | Ht 63.0 in | Wt 144.0 lb

## 2012-11-29 DIAGNOSIS — R21 Rash and other nonspecific skin eruption: Secondary | ICD-10-CM

## 2012-11-29 DIAGNOSIS — M79629 Pain in unspecified upper arm: Secondary | ICD-10-CM

## 2012-11-29 DIAGNOSIS — J449 Chronic obstructive pulmonary disease, unspecified: Secondary | ICD-10-CM

## 2012-11-29 DIAGNOSIS — M79609 Pain in unspecified limb: Secondary | ICD-10-CM

## 2012-11-29 MED ORDER — HYDROXYZINE PAMOATE 25 MG PO CAPS: 25.0000 mg | ORAL_CAPSULE | Freq: Three times a day (TID) | ORAL | Status: DC | PRN

## 2012-11-29 NOTE — Progress Notes (Signed)
  Subjective:    Patient ID: Cindy [name], female    DOB: Feb 20, 1956, 56 y.o.   MRN: [id number]  HPI Patient here with a knot under her right axilla for the past couple of weeks. She states after she's been coughing severely she has pain under her axilla. She has some pain when she moves her arm around. Denies any drainage or boil under her arm. She also noticed a rash on her leg over the past few days. It has improved some with the use of lotion.She did use a little permethrin which did not help. Denies any sick contacts, change in soap or detergent Recent COPD exacerbation she states she is much improved but she still has some congestion in her chest. She's now back to using her albuterol as needed she is taking her Symbicort and Spiriva and using her oxygen. She continues to smoke   Review of Systems   GEN- denies fatigue, fever, weight loss,weakness, recent illness HEENT- denies eye drainage, change in vision, nasal discharge, CVS- denies chest pain, palpitations RESP- denies SOB,+ cough, +wheeze MSK- denies joint pain, muscle aches, injury Skin- +rash       Objective:   Physical Exam GEN- NAD, alert and oriented x3,  HEENT- PERRL- EOMI,, MMM, oropharynx clear,  CVS- RRR, no murmur RESP-scattered wheeze,normal WOB,no retractions, fair air movement, +cough EXT- No edema Pulses- Radial 2+ Skin- few scattered erythematous maculopapular lesions on right thigh, no fluctuance Axilla- TTP over latissumus dorsi region , mild swelling in area compared to left, no discrete node , no fluctuant area, , no skin changes  MSK- Rotator cuff in tact bilat, biceps/triceps in tact       Assessment & Plan:

## 2012-11-29 NOTE — Assessment & Plan Note (Signed)
Few scattered lesions on her thigh. Some of already resolved without intervention. She states she is itching considerably at nighttime. Will give her Vistaril to use as needed and she can use topical cortisone. She is advised to stop the permethrin

## 2012-11-29 NOTE — Patient Instructions (Signed)
Start mucinex twice a day Vistaril for itching Use cortisone on the rash We will watch the arm, call back if not improved in 2 weeks Keep appt with lung doctor Continue to work on the smoking!

## 2012-11-29 NOTE — Assessment & Plan Note (Signed)
Do not feel a discrete lymph node in the is no evidence of abscess at this time. Her pain is my evaluation is over a muscle mass. At this time we will have her use ice or heat. She only gets pain when she has severe coughing episodes. She does have some mild swelling in the area. We will reevaluate in 2 weeks if this is still causing pain or does not improve I will obtain an ultrasound to make sure that this is not a note that we are missing. She has had a mammogram which was normal in March.

## 2012-11-29 NOTE — Telephone Encounter (Signed)
Patient scheduled for 1:45.

## 2012-11-29 NOTE — Telephone Encounter (Signed)
Put her in today

## 2012-11-29 NOTE — Assessment & Plan Note (Signed)
Recent exacerbation treated 2 weeks ago with prednisone and antibiotics. She is vastly improved today. Her lung exam is still lagging but she is moving air and overall feels more comfortable. I will have her continue Mucinex twice a day for the congestion. She can followup with pulmonary on Wednesday to see if there were any other medication changes that are needed.

## 2012-11-30 ENCOUNTER — Ambulatory Visit: Payer: Medicaid Other | Admitting: Family Medicine

## 2012-12-01 ENCOUNTER — Ambulatory Visit (INDEPENDENT_AMBULATORY_CARE_PROVIDER_SITE_OTHER): Payer: Medicaid Other | Admitting: Emergency Medicine

## 2012-12-01 ENCOUNTER — Encounter: Payer: Self-pay | Admitting: Emergency Medicine

## 2012-12-01 VITALS — BP 122/82 | HR 96 | Temp 98.1°F | Ht 62.5 in | Wt 144.8 lb

## 2012-12-01 DIAGNOSIS — J449 Chronic obstructive pulmonary disease, unspecified: Secondary | ICD-10-CM

## 2012-12-01 NOTE — Patient Instructions (Addendum)
Please continue your Spiriva and Symbicort  Use albuterol as needed We need to continue to work on stopping smoking. Please work on setting a quit date.  Wear your oxygen with exertion.  Follow with Dr Delton Coombes in 3 months or sooner if you have any problems.

## 2012-12-01 NOTE — Progress Notes (Signed)
  Subjective:    Patient ID: Cindy [name], female    DOB: 11-16-1956, 56 y.o.   MRN: [id number]  HPI 56 yo woman, smoker (40pk-yrs), hyperlipidemia. Followed by Dr Jeanice Lim and on Spiriva + Symbicort + ProAir prn, FEV1 19% predicted on prior PFT! She has had several exacerbations over the last year. Was first noted to be hypoxemic in 2010, is on O2 that she is using prn.  She has exertional wheeze, SOB. Coughs daily - productive white, thick.   ROV 09/27/12 -- follows up COPD, hypoxemia, on Spiriva/Symbicort. 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 Since last visit has been by Dr Jeanice Lim and in the ED, treated with pred for chest tightness.  Uses albuterol every 6 hours. Hasn;t had a-1 AT yet to my knowledge.    PULMONARY FUNCTON TEST 09/27/2012  FVC 2.31  FEV1 .87  FEV1/FVC 37.7  FVC  % Predicted 76  FEV % Predicted 38  FeF 25-75 .3  FeF 25-75 % Predicted 2.65   ROV 12/01/12 -- COPD + hypoxemia, severe AFL. Returns for f/u. Continues to smoke 0.5-1.0 pk/day. About last 3 weeks, URI sx, nasal congestion, sore throat. Was treated by Dr Jeanice Lim with augmentin and prednisone. Remains on Spiriva + Symbicort.   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      Objective:   Physical Exam Filed Vitals:   12/01/12 1331  BP: 122/82  Pulse: 96  Temp: 98.1 F (36.7 C)   Gen: Pleasant, well-nourished, in no distress, very tangential, difficult history giver  ENT: No lesions,  mouth clear,  oropharynx clear, no postnasal drip  Neck: No JVD, no TMG, no carotid bruits  Lungs: No use of accessory muscles, B rhonchi, improved wheezing compared with last visit  Cardiovascular: RRR, heart sounds normal, no murmur or gallops, no peripheral edema  Musculoskeletal: No deformities, no cyanosis or clubbing  Neuro: alert, non focal  Skin: Warm, no lesions or rashes     Assessment & Plan:  C O P D Discussed tobacco cessation Continue the spiriva and symbicort O2 at all times rov 3 months

## 2012-12-01 NOTE — Assessment & Plan Note (Signed)
Discussed tobacco cessation Continue the spiriva and symbicort O2 at all times rov 3 months

## 2012-12-14 ENCOUNTER — Encounter: Payer: Self-pay | Admitting: Family Medicine

## 2012-12-14 ENCOUNTER — Ambulatory Visit (INDEPENDENT_AMBULATORY_CARE_PROVIDER_SITE_OTHER): Payer: Medicaid Other | Admitting: Family Medicine

## 2012-12-14 VITALS — BP 116/72 | HR 90 | Resp 18 | Ht 63.0 in | Wt 145.0 lb

## 2012-12-14 DIAGNOSIS — F17200 Nicotine dependence, unspecified, uncomplicated: Secondary | ICD-10-CM

## 2012-12-14 DIAGNOSIS — Z720 Tobacco use: Secondary | ICD-10-CM

## 2012-12-14 DIAGNOSIS — J441 Chronic obstructive pulmonary disease with (acute) exacerbation: Secondary | ICD-10-CM

## 2012-12-14 MED ORDER — ALBUTEROL SULFATE (5 MG/ML) 0.5% IN NEBU
2.5000 mg | INHALATION_SOLUTION | Freq: Once | RESPIRATORY_TRACT | Status: AC
  Administered 2012-12-14: 2.5 mg via RESPIRATORY_TRACT

## 2012-12-14 MED ORDER — IPRATROPIUM BROMIDE 0.02 % IN SOLN
0.5000 mg | Freq: Once | RESPIRATORY_TRACT | Status: AC
  Administered 2012-12-14: 0.5 mg via RESPIRATORY_TRACT

## 2012-12-14 MED ORDER — SODIUM CHLORIDE 0.9 % IV SOLN
125.0000 mg | Freq: Once | INTRAVENOUS | Status: AC
  Administered 2012-12-14: 130 mg via INTRAMUSCULAR

## 2012-12-14 MED ORDER — AMOXICILLIN-POT CLAVULANATE 875-125 MG PO TABS: 1.0000 | ORAL_TABLET | Freq: Two times a day (BID) | ORAL | Status: DC

## 2012-12-14 MED ORDER — PREDNISONE 10 MG PO TABS: ORAL_TABLET | ORAL | Status: DC

## 2012-12-14 NOTE — Patient Instructions (Signed)
Use the oxygen at all times Neb treatments every 4 hours  Continue symbicort/spiriva Prednisone taper and antibiotics Recheck on Thursday morning - Fit her in 1st thing

## 2012-12-14 NOTE — Assessment & Plan Note (Signed)
Solumedrol IM, albuterol/atrovent in office Will give prednisone taper Augmentin Will discuss with her pulmonologist any other possibilities to decrease exacerbations  Wear oxygen at all times

## 2012-12-14 NOTE — Progress Notes (Signed)
  Subjective:    Patient ID: Cindy [name], female    DOB: [birth year], 56 y.o.   MRN: [id number]  HPI Patient presents with see a CPE exacerbation. She states Sunday she woke up and began to feel sick after church. She felt she cannot breathe and has not been able to catch her breath since then. She's been using her nebulizer machine as needed and wean her oxygen, she is sitting. She doesn't feel right. Her cough is nonproductive but she feels a tight in her chest.   Review of Systems  GEN- denies fatigue, fever, weight loss,weakness, recent illness HEENT- denies eye drainage, change in vision, nasal discharge, CVS- denies chest pain, palpitations RESP- + SOB, +cough, +wheeze ABD- denies N/V, change in stools, abd pain GU- denies dysuria, hematuria, dribbling, incontinence MSK- denies joint pain, muscle aches, injury Neuro- denies headache, dizziness, syncope, seizure activity      Objective:   Physical Exam GEN- NAD, alert and oriented x3,sick appearing  HEENT- PERRL, EOMI, non injected sclera, pink conjunctiva, MMM, oropharynx clear Neck- Supple,no LAD CVS- RRR, no murmur RESP-course rhonchi, decreased BS bilat, bilateral wheeze, harsh cough , wearing oxygen  EXT- No edema Pulses- Radial 2+        Assessment & Plan:

## 2012-12-14 NOTE — Assessment & Plan Note (Signed)
Ongoing use

## 2012-12-16 ENCOUNTER — Ambulatory Visit (INDEPENDENT_AMBULATORY_CARE_PROVIDER_SITE_OTHER): Payer: Medicaid Other | Admitting: Family Medicine

## 2012-12-16 ENCOUNTER — Encounter: Payer: Self-pay | Admitting: Family Medicine

## 2012-12-16 VITALS — BP 126/74 | HR 86 | Resp 18 | Ht 63.0 in | Wt 143.1 lb

## 2012-12-16 DIAGNOSIS — J441 Chronic obstructive pulmonary disease with (acute) exacerbation: Secondary | ICD-10-CM

## 2012-12-16 NOTE — Progress Notes (Signed)
  Subjective:    Patient ID: Cindy [name], female    DOB: [birth year], 56 y.o.   MRN: [id number]  HPI Patient here to followup COPD exacerbation. She was given IM Solu-Medrol 2 days ago as well as part on prednisone taper and Augmentin. She states she is feeling much better. Yesterday was tough but she used her nebulizers. She's using her oxygen as prescribed. She has no new concerns today.   Review of Systems - per above  GEN- + fatigue, fever, weight loss,weakness, recent illness HEENT- denies eye drainage, change in vision, nasal discharge, CVS- denies chest pain, palpitations RESP- denies SOB,+ cough, +wheeze ABD- denies N/V, change in stools, abd pain        Objective:   Physical Exam  GEN- NAD, alert and oriented x3,sick appearing  HEENT-  MMM, oropharynx clear, no thrush CVS- RRR, no murmur RESP-course rhonchi, improved air movement, bilateral wheeze, harsh cough , wearing oxygen  EXT- No edema Pulses- Radial 2+         Assessment & Plan:

## 2012-12-16 NOTE — Patient Instructions (Signed)
Much improved today! Continue your current medications If you get worse then go to hospital Keep previous f/u appt

## 2012-12-19 NOTE — Assessment & Plan Note (Signed)
Improved with meds, complete course, wearing oxygen as prescribed

## 2012-12-29 DIAGNOSIS — D3500 Benign neoplasm of unspecified adrenal gland: Secondary | ICD-10-CM

## 2012-12-29 DIAGNOSIS — N200 Calculus of kidney: Secondary | ICD-10-CM

## 2012-12-29 HISTORY — DX: Benign neoplasm of unspecified adrenal gland: D35.00

## 2012-12-29 HISTORY — DX: Calculus of kidney: N20.0

## 2012-12-30 ENCOUNTER — Telehealth: Payer: Self-pay | Admitting: Emergency Medicine

## 2012-12-30 NOTE — Telephone Encounter (Signed)
Please call the patient and set up an OV next available to talk about her COPD treatment. thanks

## 2012-12-30 NOTE — Telephone Encounter (Signed)
Message copied by Leslye Peer on Thu Dec 30, 2012  5:38 PM ------      Message from: Milinda Antis F      Created: Thu Dec 30, 2012  3:36 PM      Regarding: COPD              Hello,        I am not sure if you received my first message on this patient. She has severe COPD, often non compliant with her oxygen. She has been having increased exacerbations and I wanted any recommendations for her care, if anything needed to be changed or added ( Daliresp, antibiotics??). Let me know I would be glad to assist.

## 2012-12-31 NOTE — Telephone Encounter (Signed)
Patient returning call.

## 2012-12-31 NOTE — Telephone Encounter (Signed)
Spoke with patient, patient scheduled to come in Jan. 17, 2014 at 245pm.  Nothing further needed.

## 2012-12-31 NOTE — Telephone Encounter (Signed)
ATC patient, no answer. Left general mesg to return call to our office and ask to speak to Brahim Dolman.

## 2013-01-10 ENCOUNTER — Ambulatory Visit (INDEPENDENT_AMBULATORY_CARE_PROVIDER_SITE_OTHER): Payer: Medicaid Other | Admitting: Family Medicine

## 2013-01-10 ENCOUNTER — Encounter: Payer: Self-pay | Admitting: Family Medicine

## 2013-01-10 VITALS — BP 118/70 | HR 88 | Resp 18 | Ht 63.0 in | Wt 144.1 lb

## 2013-01-10 DIAGNOSIS — J441 Chronic obstructive pulmonary disease with (acute) exacerbation: Secondary | ICD-10-CM

## 2013-01-10 MED ORDER — PANTOPRAZOLE SODIUM 40 MG PO TBEC: 40.0000 mg | DELAYED_RELEASE_TABLET | Freq: Every day | ORAL | Status: DC

## 2013-01-10 MED ORDER — SODIUM CHLORIDE 0.9 % IV SOLN
125.0000 mg | Freq: Once | INTRAVENOUS | Status: DC
  Administered 2013-01-10: 130 mg via INTRAMUSCULAR

## 2013-01-10 MED ORDER — ALBUTEROL SULFATE (2.5 MG/3ML) 0.083% IN NEBU: 2.5000 mg | INHALATION_SOLUTION | RESPIRATORY_TRACT | Status: DC | PRN

## 2013-01-10 MED ORDER — ALBUTEROL SULFATE (5 MG/ML) 0.5% IN NEBU
2.5000 mg | INHALATION_SOLUTION | Freq: Once | RESPIRATORY_TRACT | Status: AC
  Administered 2013-01-10: 2.5 mg via RESPIRATORY_TRACT

## 2013-01-10 MED ORDER — ACETAMINOPHEN-CODEINE 120-12 MG/5ML PO SOLN: 5.0000 mL | Freq: Four times a day (QID) | ORAL | Status: DC | PRN

## 2013-01-10 MED ORDER — AMOXICILLIN-POT CLAVULANATE 875-125 MG PO TABS: 1.0000 | ORAL_TABLET | Freq: Two times a day (BID) | ORAL | Status: DC

## 2013-01-10 MED ORDER — PREDNISONE 10 MG PO TABS: ORAL_TABLET | ORAL | Status: DC

## 2013-01-10 MED ORDER — IPRATROPIUM BROMIDE 0.02 % IN SOLN
0.5000 mg | Freq: Once | RESPIRATORY_TRACT | Status: AC
  Administered 2013-01-10: 0.5 mg via RESPIRATORY_TRACT

## 2013-01-10 NOTE — Patient Instructions (Signed)
Antibiotics and prednisone Start steroid pills tomorrow morning Cough medicine sent in  Use albuterol nebulizer every 4 hours while awake Warm salt water gargles Go to the ER if you do not improve

## 2013-01-10 NOTE — Assessment & Plan Note (Addendum)
Given Duoneb in office, Solumedrol, antibiotics ( unfortunately she has allergy to doxy and flouroquinolones) Oxygen sat improved by end of visit, home oxygen Pt has f/u with pulmonary on Friday due to increased exacerbations Obtain CXR if she does not improve, discussed when to go to ER

## 2013-01-10 NOTE — Progress Notes (Signed)
  Subjective:    Patient ID: Cindy [name], female    DOB: June 22, 1956, 57 y.o.   MRN: [id number]  HPI   Patient presents with shortness of breath, cough  and increased sputum production of green sputum for the past 4 days. Symptoms started initially as earache and sore throat. She's been using her nebulizer at home without any improvement she is also been using her oxygen. She continues to smoke. She did not want to go to the ER but did not give any particular reason She was last treated for COPD exacerbation one month ago.     Review of Systems - per above    GEN- +fatigue, fever, weight loss,weakness, recent illness HEENT- denies eye drainage, change in vision, nasal discharge, + sore throat,  CVS- denies chest pain, palpitations RESP- + SOB, +cough, wheeze Neuro- denies headache, dizziness, syncope, seizure activity      Objective:   Physical Exam   GEN- NAD, alert and oriented x3 ,hoarse voice  HEENT- PERRL, EOMI, non injected sclera, pink conjunctiva, MMM, oropharynx mild erythema, TM clear bilat, no effusion, nares clear, no sinus tenderness Neck- Supple,  Shotty LAD CVS- RRR, no murmur RESP-bilateral wheeze inspiratory and expiratory, increased WOB,course rhonchi bilaterally, speaking in short sentences, no retractions, decreased air movement  EXT- No edema Pulses- Radial 2+   S/p neb- improved air movement to bases, bilateral wheeze, oxygen sat 91% on 2 L     Assessment & Plan:

## 2013-01-10 NOTE — Addendum Note (Signed)
Addended by: Kandis Fantasia B on: 01/10/2013 11:42 AM   Modules accepted: Orders

## 2013-01-14 ENCOUNTER — Ambulatory Visit (INDEPENDENT_AMBULATORY_CARE_PROVIDER_SITE_OTHER): Payer: Medicaid Other | Admitting: Family Medicine

## 2013-01-14 ENCOUNTER — Ambulatory Visit (INDEPENDENT_AMBULATORY_CARE_PROVIDER_SITE_OTHER): Payer: Medicaid Other | Admitting: Emergency Medicine

## 2013-01-14 ENCOUNTER — Encounter: Payer: Self-pay | Admitting: Family Medicine

## 2013-01-14 ENCOUNTER — Encounter: Payer: Self-pay | Admitting: Emergency Medicine

## 2013-01-14 VITALS — BP 126/72 | HR 68 | Resp 18 | Ht 63.0 in | Wt 146.0 lb

## 2013-01-14 VITALS — BP 122/76 | HR 81 | Temp 98.2°F | Ht 62.0 in | Wt 148.6 lb

## 2013-01-14 DIAGNOSIS — J449 Chronic obstructive pulmonary disease, unspecified: Secondary | ICD-10-CM

## 2013-01-14 DIAGNOSIS — Z720 Tobacco use: Secondary | ICD-10-CM

## 2013-01-14 DIAGNOSIS — Z1211 Encounter for screening for malignant neoplasm of colon: Secondary | ICD-10-CM

## 2013-01-14 DIAGNOSIS — J441 Chronic obstructive pulmonary disease with (acute) exacerbation: Secondary | ICD-10-CM

## 2013-01-14 DIAGNOSIS — M810 Age-related osteoporosis without current pathological fracture: Secondary | ICD-10-CM

## 2013-01-14 DIAGNOSIS — F17200 Nicotine dependence, unspecified, uncomplicated: Secondary | ICD-10-CM

## 2013-01-14 DIAGNOSIS — E785 Hyperlipidemia, unspecified: Secondary | ICD-10-CM

## 2013-01-14 LAB — COMPREHENSIVE METABOLIC PANEL
ALT: 18 U/L (ref 0–35)
Albumin: 4.4 g/dL (ref 3.5–5.2)
CO2: 31 mEq/L (ref 19–32)
Calcium: 9.4 mg/dL (ref 8.4–10.5)
Chloride: 101 mEq/L (ref 96–112)
Creat: 0.72 mg/dL (ref 0.50–1.10)

## 2013-01-14 LAB — LIPID PANEL
Cholesterol: 201 mg/dL — ABNORMAL HIGH (ref 0–200)
LDL Cholesterol: 122 mg/dL — ABNORMAL HIGH (ref 0–99)
Triglycerides: 96 mg/dL (ref <150)
VLDL: 19 mg/dL (ref 0–40)

## 2013-01-14 LAB — CBC
HCT: 41.7 % (ref 36.0–46.0)
Platelets: 317 10*3/uL (ref 150–400)
RDW: 13.2 % (ref 11.5–15.5)
WBC: 11.2 10*3/uL — ABNORMAL HIGH (ref 4.0–10.5)

## 2013-01-14 MED ORDER — ROFLUMILAST 500 MCG PO TABS: 500.0000 ug | ORAL_TABLET | Freq: Every day | ORAL | Status: DC

## 2013-01-14 NOTE — Patient Instructions (Addendum)
Finish your augmentin and prednisone Continue your Symbicort and Spiriva  Use albuterol as needed We will try starting Daliresp once a day to see if it decreases the frequency of your flare ups There is little that we can do to change your breathing until you stop smoking. This is the most important thing you can do.  Follow with Dr Delton Coombes in 1 month

## 2013-01-14 NOTE — Progress Notes (Signed)
  Subjective:    Patient ID: Cindy [name], female    DOB: 1956/05/26, 57 y.o.   MRN: [id number]  HPI  Patient here to followup COPD exacerbation and for chronic medical problems. She was seen earlier this week and prescribed Augmentin and prednisone and continue use of her inhalers and nebulizer. He is followup with pulmonary this afternoon. She's feeling much better continues to have cough which is not breaking up and is easily but her shortness of breath has improved  Osteoporotis-she is tolerating her Fosamax she also has a PPI for any GERD symptoms. She's also on calcium and vitamin D   Hyperlipidemia- due for fasting lipid panel simvastatin was restarted one week ago after a trial off for muscle aches and which she had no changes  Review of Systems  GEN- + fatigue, fever, weight loss,weakness, recent illness HEENT- denies eye drainage, change in vision, nasal discharge, CVS- denies chest pain, palpitations RESP- denies SOB, +cough, +wheeze ABD- denies N/V, change in stools, abd pain GU- denies dysuria, hematuria, dribbling, incontinence MSK- denies joint pain, muscle aches, injury Neuro- denies headache, dizziness, syncope, seizure activity      Objective:   Physical Exam  GEN- NAD, alert and oriented x3 ,hoarse voice  HEENT- PERRL, EOMI, non injected sclera, pink conjunctiva, MMM, oropharynx clear,  Neck- Supple,   CVS- RRR, no murmur ABD-NABS,soft,NT,ND RESP-bilateral wheeze inspiratory and expiratory, improved air movement, sat 95% on 2L EXT- No edema Pulses- Radial 2+       Assessment & Plan:  She agrees to go for screening colonoscopy

## 2013-01-14 NOTE — Assessment & Plan Note (Signed)
Difficult to manage - frequent AE's, but she still smokes. I can try daliresp, rotating abx, chronic pred, but the most important thing that can happen is stopping smoking. She acknowledges that she needs to stop, but doesn't hava plan, doesn't believe she can.  - continue Symbicort + Spiriva - SABA prn - tried to focus on smoking cessation, she isn't ready to do it - trial of daliresp - could try chronic pred in the future, ? Rotating abx - frequent f/u >> 1 month

## 2013-01-14 NOTE — Progress Notes (Signed)
  Subjective:    Patient ID: Cindy [name], female    DOB: 1956-01-17, 57 y.o.   MRN: [id number]  HPI 57 yo woman, smoker (40pk-yrs), hyperlipidemia. Followed by Dr Jeanice Lim and on Spiriva + Symbicort + ProAir prn, FEV1 19% predicted on prior PFT! She has had several exacerbations over the last year. Was first noted to be hypoxemic in 2010, is on O2 that she is using prn.  She has exertional wheeze, SOB. Coughs daily - productive white, thick.   ROV 09/27/12 -- follows up COPD, hypoxemia, on Spiriva/Symbicort. 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 Since last visit has been by Dr Jeanice Lim and in the ED, treated with pred for chest tightness.  Uses albuterol every 6 hours. Hasn;t had a-1 AT yet to my knowledge.    PULMONARY FUNCTON TEST 09/27/2012  FVC 2.31  FEV1 .87  FEV1/FVC 37.7  FVC  % Predicted 76  FEV % Predicted 38  FeF 25-75 .3  FeF 25-75 % Predicted 2.65   ROV 12/01/12 -- COPD + hypoxemia, severe AFL. Returns for f/u. Continues to smoke 0.5-1.0 pk/day. About last 3 weeks, URI sx, nasal congestion, sore throat. Was treated by Dr Jeanice Lim with augmentin and prednisone. Remains on Spiriva + Symbicort.   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 Has been seen in the ED and by Dr Jeanice Lim several times since our last visit. She is finishing augmentin + pred now to treat recent AE + bronchitic sx. She continues to smoke > 1pk/day. Using SABA a few times a week. Improved cough, green sputum.       Objective:   Physical Exam Filed Vitals:   01/14/13 1438  BP: 122/76  Pulse: 81  Temp: 98.2 F (36.8 C)   Gen: Pleasant, well-nourished, in no distress, very tangential, difficult history giver  ENT: No lesions,  mouth clear,  oropharynx clear, no postnasal drip  Neck: No JVD, no TMG, no carotid bruits  Lungs: No use of accessory muscles, B rhonchi, improved wheezing compared with last visit  Cardiovascular: RRR, heart sounds  normal, no murmur or gallops, no peripheral edema  Musculoskeletal: No deformities, no cyanosis or clubbing  Neuro: alert, non focal  Skin: Warm, no lesions or rashes     Assessment & Plan:  C O P D Difficult to manage - frequent AE's, but she still smokes. I can try daliresp, rotating abx, chronic pred, but the most important thing that can happen is stopping smoking. She acknowledges that she needs to stop, but doesn't hava plan, doesn't believe she can.  - continue Symbicort + Spiriva - SABA prn - tried to focus on smoking cessation, she isn't ready to do it - trial of daliresp - could try chronic pred in the future, ? Rotating abx - frequent f/u >> 1 month

## 2013-01-14 NOTE — Patient Instructions (Addendum)
Use the mucinex twice a day  Continue your steroids and antibiotics I will set you up for colonoscopy Get the labs done today fasting we will call with results F/U 3 months

## 2013-01-14 NOTE — Assessment & Plan Note (Signed)
She is near her baseline with the exception of the cough. She will complete her course of antibiotics and prednisone. I will await recommendations from her pulmonologist she is wearing her oxygen

## 2013-01-14 NOTE — Assessment & Plan Note (Signed)
She will continue use of her bisphosphonate as well as calcium and vitamin D

## 2013-01-14 NOTE — Assessment & Plan Note (Signed)
Ongoing tobacco use continue to encourage cessation she is trying to cut back the finding this very difficult

## 2013-01-14 NOTE — Assessment & Plan Note (Signed)
Continue statin drugs for fasting lipid panel and other fasting labs will be checked

## 2013-01-16 ENCOUNTER — Emergency Department (HOSPITAL_COMMUNITY): Payer: Medicaid Other

## 2013-01-16 ENCOUNTER — Emergency Department (HOSPITAL_COMMUNITY)
Admission: EM | Admit: 2013-01-16 | Discharge: 2013-01-16 | Disposition: A | Discharge status: Home / Self Care | Payer: Medicaid Other | Attending: Emergency Medicine | Admitting: Emergency Medicine

## 2013-01-16 ENCOUNTER — Encounter (HOSPITAL_COMMUNITY): Payer: Self-pay | Admitting: *Deleted

## 2013-01-16 DIAGNOSIS — R231 Pallor: Secondary | ICD-10-CM | POA: Insufficient documentation

## 2013-01-16 DIAGNOSIS — M810 Age-related osteoporosis without current pathological fracture: Secondary | ICD-10-CM | POA: Insufficient documentation

## 2013-01-16 DIAGNOSIS — Z79899 Other long term (current) drug therapy: Secondary | ICD-10-CM | POA: Insufficient documentation

## 2013-01-16 DIAGNOSIS — R0789 Other chest pain: Secondary | ICD-10-CM | POA: Insufficient documentation

## 2013-01-16 DIAGNOSIS — E785 Hyperlipidemia, unspecified: Secondary | ICD-10-CM | POA: Insufficient documentation

## 2013-01-16 DIAGNOSIS — Z7982 Long term (current) use of aspirin: Secondary | ICD-10-CM | POA: Insufficient documentation

## 2013-01-16 DIAGNOSIS — Z862 Personal history of diseases of the blood and blood-forming organs and certain disorders involving the immune mechanism: Secondary | ICD-10-CM | POA: Insufficient documentation

## 2013-01-16 DIAGNOSIS — R05 Cough: Secondary | ICD-10-CM | POA: Insufficient documentation

## 2013-01-16 DIAGNOSIS — J449 Chronic obstructive pulmonary disease, unspecified: Secondary | ICD-10-CM

## 2013-01-16 DIAGNOSIS — J3489 Other specified disorders of nose and nasal sinuses: Secondary | ICD-10-CM | POA: Insufficient documentation

## 2013-01-16 DIAGNOSIS — Z8781 Personal history of (healed) traumatic fracture: Secondary | ICD-10-CM | POA: Insufficient documentation

## 2013-01-16 DIAGNOSIS — F17200 Nicotine dependence, unspecified, uncomplicated: Secondary | ICD-10-CM | POA: Insufficient documentation

## 2013-01-16 DIAGNOSIS — J9610 Chronic respiratory failure, unspecified whether with hypoxia or hypercapnia: Secondary | ICD-10-CM | POA: Insufficient documentation

## 2013-01-16 DIAGNOSIS — R059 Cough, unspecified: Secondary | ICD-10-CM | POA: Insufficient documentation

## 2013-01-16 DIAGNOSIS — J4489 Other specified chronic obstructive pulmonary disease: Secondary | ICD-10-CM | POA: Insufficient documentation

## 2013-01-16 DIAGNOSIS — Z8639 Personal history of other endocrine, nutritional and metabolic disease: Secondary | ICD-10-CM | POA: Insufficient documentation

## 2013-01-16 DIAGNOSIS — J329 Chronic sinusitis, unspecified: Secondary | ICD-10-CM | POA: Insufficient documentation

## 2013-01-16 LAB — CBC WITH DIFFERENTIAL/PLATELET
Basophils Absolute: 0.1 10*3/uL (ref 0.0–0.1)
Eosinophils Relative: 0 % (ref 0–5)
Lymphocytes Relative: 46 % (ref 12–46)
MCV: 93 fL (ref 78.0–100.0)
Neutrophils Relative %: 39 % — ABNORMAL LOW (ref 43–77)
Platelets: 306 10*3/uL (ref 150–400)
RDW: 13.9 % (ref 11.5–15.5)
WBC: 15.6 10*3/uL — ABNORMAL HIGH (ref 4.0–10.5)

## 2013-01-16 LAB — BASIC METABOLIC PANEL
CO2: 33 mEq/L — ABNORMAL HIGH (ref 19–32)
Calcium: 10.3 mg/dL (ref 8.4–10.5)
Creatinine, Ser: 0.72 mg/dL (ref 0.50–1.10)
GFR calc Af Amer: >90 mL/min (ref >90)
GFR calc non Af Amer: >90 mL/min (ref >90)

## 2013-01-16 MED ORDER — ALBUTEROL SULFATE (5 MG/ML) 0.5% IN NEBU
5.0000 mg | INHALATION_SOLUTION | Freq: Once | RESPIRATORY_TRACT | Status: AC
  Administered 2013-01-16: 5 mg via RESPIRATORY_TRACT

## 2013-01-16 MED ORDER — ALBUTEROL (5 MG/ML) CONTINUOUS INHALATION SOLN
15.0000 mg/h | INHALATION_SOLUTION | Freq: Once | RESPIRATORY_TRACT | Status: AC
  Administered 2013-01-16: 15 mg/h via RESPIRATORY_TRACT
  Filled 2013-01-16: qty 20

## 2013-01-16 MED ORDER — PREDNISONE 50 MG PO TABS
60.0000 mg | ORAL_TABLET | Freq: Once | ORAL | Status: AC
  Administered 2013-01-16: 60 mg via ORAL
  Filled 2013-01-16: qty 1

## 2013-01-16 MED ORDER — IPRATROPIUM BROMIDE 0.02 % IN SOLN
0.5000 mg | Freq: Once | RESPIRATORY_TRACT | Status: AC
  Administered 2013-01-16: 0.5 mg via RESPIRATORY_TRACT
  Filled 2013-01-16: qty 2.5

## 2013-01-16 MED ORDER — ALBUTEROL SULFATE (5 MG/ML) 0.5% IN NEBU: 15.0000 mg | INHALATION_SOLUTION | Freq: Once | RESPIRATORY_TRACT | Status: DC

## 2013-01-16 NOTE — ED Notes (Signed)
C/o increased sob, congestion, and cough x 6 days.  Seen PCP 6 days ago, given abx.  States is not getting better.

## 2013-01-16 NOTE — ED Provider Notes (Signed)
History  This chart was scribed for Joya Gaskins, MD by Erskine Emery, ED Scribe. This patient was seen in room APA12/[facility]12 and the patient's care was started at 10:06.   CSN: [id number]  Arrival date & time 01/16/13  4098   None     Chief Complaint  Patient presents with  . Shortness of Breath    The history is provided by the patient. No language interpreter was used.  Cindy [name] is a 57 y.o. female who presents to the Emergency Department complaining of gradually increasing shortness of breath, congestion, and productive cough with green phlegm for the past 6 days. Pt reports some associated chest tightness, rhinorrhea, and turning pale with severe coughing but no syncope.  Pt saw her PCP for the same complaint 6 days ago. She was given antibiotics and prednisone which have done nothing to relieve her symptoms. She reports she started them Tuesday (6 days ago); she hasn't taken any today but today is supposed to be her last dose. She has also been taking Mucinex. Pt is on 2-3L of O2 at home. She has a h/o SOB, COPD, chronic respiratory failure, tobacco abuse, hyperlipidemia, and thyroid disease.  Dr. Jeanice Lim is the pt's PCP.  Past Medical History  Diagnosis Date  . COPD (chronic obstructive pulmonary disease)   . Shortness of breath   . Compression fracture of lumbar vertebra   . Hyperglycemia, drug-induced   . Hyperlipidemia   . Osteoporosis 2013  . Tobacco abuse   . Chronic respiratory failure On home 02 2-3L  . Thyroid disease     pt denies    Past Surgical History  Procedure Date  . Tubal ligation   . Cervical biopsy     Family History  Problem Relation Age of Onset  . Heart disease Mother   . Hypertension Sister   . Hypertension Brother     History  Substance Use Topics  . Smoking status: Current Every Day Smoker -- 1.0 packs/day    Types: Cigarettes  . Smokeless tobacco: Never Used  . Alcohol Use: No    OB History    Grav Para Term Preterm  Abortions TAB SAB Ect Mult Living                  Review of Systems  HENT: Positive for congestion and rhinorrhea.   Respiratory: Positive for cough, chest tightness and shortness of breath.   Skin: Positive for pallor.  All other systems reviewed and are negative.    Allergies  Doxycycline and Avelox  Home Medications   Current Outpatient Rx  Name  Route  Sig  Dispense  Refill  . ACETAMINOPHEN 325 MG PO TABS   Oral   Take 2 tablets (650 mg total) by mouth every 6 (six) hours as needed (or Fever >/= 101).         . ACETAMINOPHEN-CODEINE 120-12 MG/5ML PO SOLN   Oral   Take 5 mLs by mouth every 6 (six) hours as needed for pain.   120 mL   0   . ALBUTEROL SULFATE HFA 108 (90 BASE) MCG/ACT IN AERS   Inhalation   Inhale 2 puffs into the lungs every 4 (four) hours as needed for wheezing.   1 Inhaler   2   . ALBUTEROL SULFATE (2.5 MG/3ML) 0.083% IN NEBU   Nebulization   Take 3 mLs (2.5 mg total) by nebulization every 4 (four) hours as needed for wheezing.   75 mL  6   . ALENDRONATE SODIUM 70 MG PO TABS   Oral   Take 70 mg by mouth every 7 (seven) days. Take with a full glass of water on an empty stomach.(wednesday)         . AMOXICILLIN-POT CLAVULANATE 875-125 MG PO TABS   Oral   Take 1 tablet by mouth every 12 (twelve) hours.   14 tablet   0   . ASPIRIN EC 81 MG PO TBEC   Oral   Take 81 mg by mouth daily.           . BUDESONIDE-FORMOTEROL FUMARATE 160-4.5 MCG/ACT IN AERO   Inhalation   Inhale 2 puffs into the lungs 2 (two) times daily.   1 Inhaler   11   . CALCIUM CARBONATE-VITAMIN D 600-400 MG-UNIT PO TABS   Oral   Take 1 tablet by mouth 2 (two) times daily.         . ADULT MULTIVITAMIN W/MINERALS CH   Oral   Take 1 tablet by mouth daily.          Marland Kitchen PANTOPRAZOLE SODIUM 40 MG PO TBEC   Oral   Take 1 tablet (40 mg total) by mouth daily.   90 tablet   1   . PREDNISONE 10 MG PO TABS      Take 4 tablets daily x 3 days, then 2 tablets x 3  days, then 1 tablets x3 days,   21 tablet   0   . ROFLUMILAST 500 MCG PO TABS   Oral   Take 1 tablet (500 mcg total) by mouth daily.   30 tablet   11   . SIMVASTATIN 20 MG PO TABS   Oral   Take 1 tablet (20 mg total) by mouth every evening.   90 tablet   1     Dispense 90 day supply   . SPIRIVA HANDIHALER 18 MCG IN CAPS      inhale the contents of one capsule in the handihaler once daily   30 each   2     Triage vitals: BP 127/71  Pulse 116  Temp 97.9 F (36.6 C) (Oral)  Resp 22  Ht 5\' 2"  (1.575 m)  Wt 144 lb (65.318 kg)  BMI 26.34 kg/m2  SpO2 93%  Physical Exam CONSTITUTIONAL: Well developed/well nourished HEAD AND FACE: Normocephalic/atraumatic EYES: EOMI/PERRL ENMT: Mucous membranes dry NECK: supple no meningeal signs SPINE:entire spine nontender CV: S1/S2 noted, no murmurs/rubs/gallops noted LUNGS: wheezing bilaterally, mild tachypnea noted ABDOMEN: soft, nontender, no rebound or guarding GU:no cva tenderness NEURO: Pt is awake/alert, moves all extremitiesx4 EXTREMITIES: pulses normal, full ROM, no edema noted SKIN: warm, color normal PSYCH: no abnormalities of mood noted   ED Course  Procedures  DIAGNOSTIC STUDIES: Oxygen Saturation is 93% on [place], adequate by my interpretation.    COORDINATION OF CARE: 10:06--I evaluated the patient and we discussed a treatment plan including breathing treatments to which the pt agreed. Pt requests a decongestant.  Dg Chest 2 View  01/16/2013  *RADIOLOGY REPORT*  Clinical Data: Shortness of breath.  Smoker.  CHEST - 2 VIEW  Comparison: Chest radiograph 09/18/2012, 06/27/2012, and 02/23/2012  Findings: Normal heart and mediastinal contours.  Slight prominence of the central pulmonary arteries is stable.  There are emphysematous changes with hyperinflation bilaterally.  No airspace disease, effusion, or pneumothorax.  Bones appear osteopenic.  No acute osseous abnormality.  IMPRESSION: Emphysema with hyperinflation.  No  acute findings identified.   Original Report Authenticated  By: Britta Mccreedy, M.D.     Pt biggest concern is her nasal congestion and frequent cough.  Her wheezing is improved.  She is in no distress. She is now stable/improved on her home O2.  She is currently on abx/prednisone will not add another antibiotic.  Advised to increased her albuterol use.  Also advised she can use sudafed for short term relief.  She is no distress and stable for d/c     MDM  Nursing notes including past medical history and social history reviewed and considered in documentation xrays reviewed and considered Labs/vital reviewed and considered Previous records reviewed and considered - previous pulmonology notes reviewed      Date: 01/16/2013  Rate: 122  Rhythm: sinus tachycardia  QRS Axis: normal  Intervals: normal  ST/T Wave abnormalities: nonspecific ST changes  Conduction Disutrbances:none  Narrative Interpretation:   Old EKG Reviewed: unchanged    I personally performed the services described in this documentation, which was scribed in my presence. The recorded information has been reviewed and is accurate.      Joya Gaskins, MD 01/16/13 1525

## 2013-01-17 LAB — PATHOLOGIST SMEAR REVIEW

## 2013-01-18 ENCOUNTER — Telehealth: Payer: Self-pay | Admitting: Family Medicine

## 2013-01-18 ENCOUNTER — Other Ambulatory Visit: Payer: Self-pay | Admitting: Family Medicine

## 2013-01-20 NOTE — Telephone Encounter (Signed)
States she wants some mouth rinse because her mouth is sore and she can see red on her tongue from all the breathing treatments she has been using  [facility]

## 2013-01-31 ENCOUNTER — Telehealth: Payer: Self-pay | Admitting: Family Medicine

## 2013-01-31 NOTE — Telephone Encounter (Signed)
Pt hasn't heard anything on referral.

## 2013-02-01 ENCOUNTER — Telehealth: Payer: Self-pay | Admitting: Family Medicine

## 2013-02-01 DIAGNOSIS — Z1211 Encounter for screening for malignant neoplasm of colon: Secondary | ICD-10-CM

## 2013-02-01 NOTE — Telephone Encounter (Signed)
New referral placed.

## 2013-02-02 ENCOUNTER — Encounter (INDEPENDENT_AMBULATORY_CARE_PROVIDER_SITE_OTHER): Payer: Self-pay | Admitting: *Deleted

## 2013-02-07 ENCOUNTER — Other Ambulatory Visit: Payer: Self-pay | Admitting: Family Medicine

## 2013-02-07 DIAGNOSIS — Z139 Encounter for screening, unspecified: Secondary | ICD-10-CM

## 2013-02-17 ENCOUNTER — Encounter (INDEPENDENT_AMBULATORY_CARE_PROVIDER_SITE_OTHER): Payer: Self-pay | Admitting: *Deleted

## 2013-02-17 ENCOUNTER — Telehealth (INDEPENDENT_AMBULATORY_CARE_PROVIDER_SITE_OTHER): Payer: Self-pay | Admitting: *Deleted

## 2013-02-17 ENCOUNTER — Other Ambulatory Visit (INDEPENDENT_AMBULATORY_CARE_PROVIDER_SITE_OTHER): Payer: Self-pay | Admitting: *Deleted

## 2013-02-17 DIAGNOSIS — Z1211 Encounter for screening for malignant neoplasm of colon: Secondary | ICD-10-CM

## 2013-02-17 MED ORDER — PEG-KCL-NACL-NASULF-NA ASC-C 100 G PO SOLR: 1.0000 | Freq: Once | ORAL | Status: DC

## 2013-02-17 NOTE — Telephone Encounter (Signed)
Patient needs movi prep 

## 2013-02-22 ENCOUNTER — Ambulatory Visit (INDEPENDENT_AMBULATORY_CARE_PROVIDER_SITE_OTHER): Payer: Medicaid Other | Admitting: Emergency Medicine

## 2013-02-22 ENCOUNTER — Encounter: Payer: Self-pay | Admitting: Emergency Medicine

## 2013-02-22 VITALS — BP 128/68 | HR 115 | Temp 98.6°F | Ht 62.0 in | Wt 146.8 lb

## 2013-02-22 DIAGNOSIS — J449 Chronic obstructive pulmonary disease, unspecified: Secondary | ICD-10-CM

## 2013-02-22 MED ORDER — PREDNISONE 20 MG PO TABS: 40.0000 mg | ORAL_TABLET | Freq: Every day | ORAL | Status: DC

## 2013-02-22 NOTE — Progress Notes (Signed)
  Subjective:    Patient ID: Cindy [name], female    DOB: 02/26/1956, 57 y.o.   MRN: [id number]  HPI 57 yo woman, smoker (40pk-yrs), hyperlipidemia. Followed by Dr Jeanice Lim and on Spiriva + Symbicort + ProAir prn, FEV1 19% predicted on prior PFT! She has had several exacerbations over the last year. Was first noted to be hypoxemic in 2010, is on O2 that she is using prn.  She has exertional wheeze, SOB. Coughs daily - productive white, thick.   ROV 09/27/12 -- follows up COPD, hypoxemia, on Spiriva/Symbicort. 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 Since last visit has been by Dr Jeanice Lim and in the ED, treated with pred for chest tightness.  Uses albuterol every 6 hours. Hasn;t had a-1 AT yet to my knowledge.    PULMONARY FUNCTON TEST 09/27/2012  FVC 2.31  FEV1 .87  FEV1/FVC 37.7  FVC  % Predicted 76  FEV % Predicted 38  FeF 25-75 .3  FeF 25-75 % Predicted 2.65   ROV 12/01/12 -- COPD + hypoxemia, severe AFL. Returns for f/u. Continues to smoke 0.5-1.0 pk/day. About last 3 weeks, URI sx, nasal congestion, sore throat. Was treated by Dr Jeanice Lim with augmentin and prednisone. Remains on Spiriva + Symbicort.   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 Has been seen in the ED and by Dr Jeanice Lim several times since our last visit. She is finishing augmentin + pred now to treat recent AE + bronchitic sx. She continues to smoke > 1pk/day. Using SABA a few times a week. Improved cough, green sputum.   ROV 02/22/13 -- severe COPD, frequent AE's, hypoxemia. Active tobacco use. We started daliresp last time. She reports that she is doing OK, she was treated with abx/pred before our last visit 1/17. She reports constipation since last visit, ? From daliresp. She is smoking >1pk/day. She is asking for prednisone for her back pain, constipation.       Objective:   Physical Exam Filed Vitals:   02/22/13 1356  BP: 128/68  Pulse: 115  Temp: 98.6 F (37  C)   Gen: Pleasant, well-nourished, in no distress, very tangential, difficult history giver  ENT: No lesions,  mouth clear,  oropharynx clear, no postnasal drip  Neck: No JVD, no TMG, no carotid bruits  Lungs: No use of accessory muscles, B rhonchi,   Cardiovascular: RRR, heart sounds normal, no murmur or gallops, no peripheral edema  Musculoskeletal: No deformities, no cyanosis or clubbing  Neuro: alert, non focal  Skin: Warm, no lesions or rashes     Assessment & Plan:  C O P D Discussed smoking cessation in detail - she isn't ready to commit to this.  She is using O2 prn; I have underscored that she needs it at all times.  Continue spiriva + symbicort., SABA prn, daliresp Prednisone burst x 5 days Follow with Dr Delton Coombes in 3 months or sooner if you have any problems.

## 2013-02-22 NOTE — Assessment & Plan Note (Addendum)
Discussed smoking cessation in detail - she isn't ready to commit to this.  She is using O2 prn; I have underscored that she needs it at all times.  Continue spiriva + symbicort., SABA prn, daliresp Prednisone burst x 5 days Follow with Dr Delton Coombes in 3 months or sooner if you have any problems.

## 2013-02-22 NOTE — Patient Instructions (Addendum)
Please continue your current medications Follow with Dr Delton Coombes in 2 months or sooner if you have any problems. Wear your oxygen with all exertion and while sleeping Take prednisone as directed

## 2013-03-02 ENCOUNTER — Telehealth (INDEPENDENT_AMBULATORY_CARE_PROVIDER_SITE_OTHER): Payer: Self-pay | Admitting: *Deleted

## 2013-03-02 NOTE — Telephone Encounter (Signed)
  Procedure: tcs  Reason/Indication:  screening  Has patient had this procedure before?  no  If so, when, by whom and where?    Is there a family history of colon cancer?  no  Who?  What age when diagnosed?    Is patient diabetic?   no      Does patient have prosthetic heart valve?  no  Do you have a pacemaker?  no  Has patient had joint replacement within last 12 months?  no  Is patient on Coumadin, Plavix and/or Aspirin? yes  Medications: asa 81 mg daily mucinex prn, dalirest 500 mg daily, simvastatin 20 mg daily, pantoprazole 40 mg daily, alendronate 70 mg 1 tab weekly, spiriva inhaler daily, symbicort 160/4.5 mg 2 puffs in am & 2 puffs in pm, albuterol nebulizer prn, calcium 600 mg bid, womens vitamin daily  Allergies: nkda  Medication Adjustment: asa 2 days  Procedure date & time: 03/31/13 at 730

## 2013-03-03 ENCOUNTER — Ambulatory Visit (HOSPITAL_COMMUNITY)
Admission: RE | Admit: 2013-03-03 | Discharge: 2013-03-03 | Disposition: A | Discharge status: Home / Self Care | Payer: Medicaid Other | Source: Ambulatory Visit | Attending: Family Medicine | Admitting: Family Medicine

## 2013-03-03 ENCOUNTER — Encounter: Payer: Self-pay | Admitting: Family Medicine

## 2013-03-03 ENCOUNTER — Ambulatory Visit (INDEPENDENT_AMBULATORY_CARE_PROVIDER_SITE_OTHER): Payer: Medicaid Other | Admitting: Family Medicine

## 2013-03-03 ENCOUNTER — Telehealth: Payer: Self-pay | Admitting: Family Medicine

## 2013-03-03 VITALS — BP 112/78 | HR 103 | Wt 144.1 lb

## 2013-03-03 DIAGNOSIS — K5900 Constipation, unspecified: Secondary | ICD-10-CM

## 2013-03-03 DIAGNOSIS — J441 Chronic obstructive pulmonary disease with (acute) exacerbation: Secondary | ICD-10-CM

## 2013-03-03 DIAGNOSIS — M810 Age-related osteoporosis without current pathological fracture: Secondary | ICD-10-CM | POA: Insufficient documentation

## 2013-03-03 DIAGNOSIS — M549 Dorsalgia, unspecified: Secondary | ICD-10-CM

## 2013-03-03 DIAGNOSIS — M545 Low back pain, unspecified: Secondary | ICD-10-CM | POA: Insufficient documentation

## 2013-03-03 DIAGNOSIS — X58XXXA Exposure to other specified factors, initial encounter: Secondary | ICD-10-CM | POA: Insufficient documentation

## 2013-03-03 DIAGNOSIS — S32030A Wedge compression fracture of third lumbar vertebra, initial encounter for closed fracture: Secondary | ICD-10-CM | POA: Insufficient documentation

## 2013-03-03 DIAGNOSIS — S32009A Unspecified fracture of unspecified lumbar vertebra, initial encounter for closed fracture: Secondary | ICD-10-CM | POA: Insufficient documentation

## 2013-03-03 DIAGNOSIS — Z1211 Encounter for screening for malignant neoplasm of colon: Secondary | ICD-10-CM

## 2013-03-03 DIAGNOSIS — IMO0002 Reserved for concepts with insufficient information to code with codable children: Secondary | ICD-10-CM | POA: Insufficient documentation

## 2013-03-03 DIAGNOSIS — J449 Chronic obstructive pulmonary disease, unspecified: Secondary | ICD-10-CM

## 2013-03-03 LAB — POC HEMOCCULT BLD/STL (OFFICE/1-CARD/DIAGNOSTIC): Fecal Occult Blood, POC: NEGATIVE

## 2013-03-03 MED ORDER — PREDNISONE 10 MG PO TABS: ORAL_TABLET | ORAL | Status: DC

## 2013-03-03 MED ORDER — NYSTATIN 100000 UNIT/ML MT SUSP: 500000.0000 [IU] | Freq: Four times a day (QID) | OROMUCOSAL | Status: AC

## 2013-03-03 MED ORDER — TIOTROPIUM BROMIDE MONOHYDRATE 18 MCG IN CAPS: ORAL_CAPSULE | RESPIRATORY_TRACT | Status: DC

## 2013-03-03 MED ORDER — ALENDRONATE SODIUM 70 MG PO TABS: 70.0000 mg | ORAL_TABLET | ORAL | Status: DC

## 2013-03-03 MED ORDER — ALBUTEROL SULFATE (2.5 MG/3ML) 0.083% IN NEBU
2.5000 mg | INHALATION_SOLUTION | Freq: Once | RESPIRATORY_TRACT | Status: AC
  Administered 2013-03-03: 2.5 mg via RESPIRATORY_TRACT

## 2013-03-03 MED ORDER — IPRATROPIUM BROMIDE 0.02 % IN SOLN
0.5000 mg | Freq: Once | RESPIRATORY_TRACT | Status: AC
  Administered 2013-03-03: 0.5 mg via RESPIRATORY_TRACT

## 2013-03-03 MED ORDER — METHYLPREDNISOLONE SODIUM SUCC 125 MG IJ SOLR
125.0000 mg | Freq: Once | INTRAMUSCULAR | Status: AC
  Administered 2013-03-03: 125 mg via INTRAMUSCULAR

## 2013-03-03 NOTE — Patient Instructions (Addendum)
Increase mirlax to 1 cap fill twice a day  Fleet Enema Nystatin for thrush Longer prednisone burst Get xray of back next week Keep previous follow-up

## 2013-03-03 NOTE — Assessment & Plan Note (Signed)
Plain x-ray reveals compression fracture which has worsened since 2011 MRI will be obtained

## 2013-03-03 NOTE — Assessment & Plan Note (Signed)
She has compression fracture noted on the plane images I will get an MRI to determine how severe this is she will need further treatment

## 2013-03-03 NOTE — Telephone Encounter (Signed)
agree

## 2013-03-03 NOTE — Telephone Encounter (Signed)
Patient is aware 

## 2013-03-03 NOTE — Assessment & Plan Note (Signed)
Will increase MiraLAX to twice a day. She is due to have colonoscopy in 4 weeks. No gross blood on exam and fecal occult blood testing is negative

## 2013-03-03 NOTE — Progress Notes (Signed)
  Subjective:    Patient ID: Cindy [name], female    DOB: 10-Nov-1956, 57 y.o.   MRN: [id number]  HPI  Patient presents with shortness of breath and wheezing for the past week she did take a 5 day prednisone burst by her pulmonologist started on February 25 stay she improved some but her symptoms persisted. She does complain of lower back pain with her cough and tightness in her abdomen. She states she is being having small pellet-like stools which have been dark in nature she denies any bright red blood. She has tried prune juice and a couple of days of MiraLAX  Review of Systems  GEN- denies fatigue, fever, weight loss,weakness, recent illness HEENT- denies eye drainage, change in vision, nasal discharge, CVS- denies chest pain, palpitations RESP- +SOB,+ cough, +wheeze ABD- denies N/V, change in stools, +abd bloating GU- denies dysuria, hematuria, dribbling, incontinence MSK- + joint pain, muscle aches, injury Neuro- denies headache, dizziness, syncope, seizure activity      Objective:   Physical Exam GEN- NAD, alert and oriented x3 HEENT- PERRL, EOMI, non injected sclera, pink conjunctiva, MMM, oropharynx clear Neck- Supple,  CVS- mild tachycardia, no murmur RESP-inspiratory and expiratory wheeze , course BS bilat, decreased air movement ABD-NABS,soft,bloated appearing, NT Back- TTP lumbar spine, Neg SLR EXT- No edema Pulses- Radial, DP- 2+        Assessment & Plan:

## 2013-03-03 NOTE — Assessment & Plan Note (Addendum)
S/p neb improved WOB, reviewed last pulmonary note she is very severe COPD. She may end up on chronic prednisone. Given IM Solu-Medrol and longer prednisone taper she does better with this. She was last treated in my office in January. Antibiotics will be held at this time she does not have any increase production or change to his sputum production this is likely viral that started this is acute exacerbation though it is milder compared to previous times I have seen her. Nystatin sent as she tends to get thrush after prednisone use

## 2013-03-07 ENCOUNTER — Other Ambulatory Visit (HOSPITAL_COMMUNITY): Payer: Medicaid Other

## 2013-03-07 ENCOUNTER — Ambulatory Visit (HOSPITAL_COMMUNITY)
Admission: RE | Admit: 2013-03-07 | Discharge: 2013-03-07 | Disposition: A | Discharge status: Home / Self Care | Payer: Medicaid Other | Source: Ambulatory Visit | Attending: Family Medicine | Admitting: Family Medicine

## 2013-03-07 DIAGNOSIS — Z1231 Encounter for screening mammogram for malignant neoplasm of breast: Secondary | ICD-10-CM | POA: Insufficient documentation

## 2013-03-07 DIAGNOSIS — Z139 Encounter for screening, unspecified: Secondary | ICD-10-CM

## 2013-03-08 ENCOUNTER — Ambulatory Visit (HOSPITAL_COMMUNITY)
Admission: RE | Admit: 2013-03-08 | Discharge: 2013-03-08 | Disposition: A | Discharge status: Home / Self Care | Payer: Medicaid Other | Source: Ambulatory Visit | Attending: Family Medicine | Admitting: Family Medicine

## 2013-03-08 DIAGNOSIS — M545 Low back pain, unspecified: Secondary | ICD-10-CM | POA: Insufficient documentation

## 2013-03-08 DIAGNOSIS — M51379 Other intervertebral disc degeneration, lumbosacral region without mention of lumbar back pain or lower extremity pain: Secondary | ICD-10-CM | POA: Insufficient documentation

## 2013-03-08 DIAGNOSIS — M5137 Other intervertebral disc degeneration, lumbosacral region: Secondary | ICD-10-CM | POA: Insufficient documentation

## 2013-03-08 DIAGNOSIS — M79609 Pain in unspecified limb: Secondary | ICD-10-CM | POA: Insufficient documentation

## 2013-03-08 DIAGNOSIS — S32030A Wedge compression fracture of third lumbar vertebra, initial encounter for closed fracture: Secondary | ICD-10-CM

## 2013-03-11 ENCOUNTER — Other Ambulatory Visit: Payer: Self-pay | Admitting: Family Medicine

## 2013-03-11 DIAGNOSIS — M5136 Other intervertebral disc degeneration, lumbar region: Secondary | ICD-10-CM

## 2013-03-11 DIAGNOSIS — Q891 Congenital malformations of adrenal gland: Secondary | ICD-10-CM

## 2013-03-11 DIAGNOSIS — M4856XA Collapsed vertebra, not elsewhere classified, lumbar region, initial encounter for fracture: Secondary | ICD-10-CM

## 2013-03-14 ENCOUNTER — Telehealth: Payer: Self-pay | Admitting: Family Medicine

## 2013-03-14 NOTE — Telephone Encounter (Signed)
Patient is aware 

## 2013-03-16 ENCOUNTER — Encounter (HOSPITAL_COMMUNITY): Payer: Self-pay

## 2013-03-16 ENCOUNTER — Observation Stay (HOSPITAL_COMMUNITY)
Admission: EM | Admit: 2013-03-16 | Discharge: 2013-03-17 | DRG: 189 | Disposition: A | Discharge status: Home / Self Care | Payer: Medicaid Other | Attending: Internal Medicine | Admitting: Internal Medicine

## 2013-03-16 ENCOUNTER — Emergency Department (HOSPITAL_COMMUNITY): Payer: Medicaid Other

## 2013-03-16 DIAGNOSIS — K5900 Constipation, unspecified: Secondary | ICD-10-CM

## 2013-03-16 DIAGNOSIS — M8448XA Pathological fracture, other site, initial encounter for fracture: Secondary | ICD-10-CM | POA: Diagnosis present

## 2013-03-16 DIAGNOSIS — J209 Acute bronchitis, unspecified: Secondary | ICD-10-CM | POA: Diagnosis present

## 2013-03-16 DIAGNOSIS — F17200 Nicotine dependence, unspecified, uncomplicated: Secondary | ICD-10-CM | POA: Diagnosis present

## 2013-03-16 DIAGNOSIS — J449 Chronic obstructive pulmonary disease, unspecified: Secondary | ICD-10-CM

## 2013-03-16 DIAGNOSIS — J9611 Chronic respiratory failure with hypoxia: Secondary | ICD-10-CM | POA: Diagnosis present

## 2013-03-16 DIAGNOSIS — S32030A Wedge compression fracture of third lumbar vertebra, initial encounter for closed fracture: Secondary | ICD-10-CM

## 2013-03-16 DIAGNOSIS — M545 Low back pain, unspecified: Secondary | ICD-10-CM | POA: Diagnosis present

## 2013-03-16 DIAGNOSIS — R0902 Hypoxemia: Secondary | ICD-10-CM

## 2013-03-16 DIAGNOSIS — J40 Bronchitis, not specified as acute or chronic: Secondary | ICD-10-CM | POA: Diagnosis present

## 2013-03-16 DIAGNOSIS — Z720 Tobacco use: Secondary | ICD-10-CM | POA: Diagnosis present

## 2013-03-16 DIAGNOSIS — J441 Chronic obstructive pulmonary disease with (acute) exacerbation: Secondary | ICD-10-CM | POA: Diagnosis present

## 2013-03-16 DIAGNOSIS — R739 Hyperglycemia, unspecified: Secondary | ICD-10-CM

## 2013-03-16 DIAGNOSIS — J9620 Acute and chronic respiratory failure, unspecified whether with hypoxia or hypercapnia: Principal | ICD-10-CM | POA: Diagnosis present

## 2013-03-16 DIAGNOSIS — G4700 Insomnia, unspecified: Secondary | ICD-10-CM | POA: Diagnosis present

## 2013-03-16 DIAGNOSIS — E785 Hyperlipidemia, unspecified: Secondary | ICD-10-CM | POA: Diagnosis present

## 2013-03-16 DIAGNOSIS — K219 Gastro-esophageal reflux disease without esophagitis: Secondary | ICD-10-CM | POA: Diagnosis present

## 2013-03-16 DIAGNOSIS — Z9981 Dependence on supplemental oxygen: Secondary | ICD-10-CM

## 2013-03-16 DIAGNOSIS — R635 Abnormal weight gain: Secondary | ICD-10-CM

## 2013-03-16 DIAGNOSIS — M810 Age-related osteoporosis without current pathological fracture: Secondary | ICD-10-CM

## 2013-03-16 DIAGNOSIS — R51 Headache: Secondary | ICD-10-CM | POA: Diagnosis present

## 2013-03-16 LAB — CBC WITH DIFFERENTIAL/PLATELET
Basophils Relative: 0 % (ref 0–1)
Eosinophils Absolute: 0.1 10*3/uL (ref 0.0–0.7)
Eosinophils Relative: 1 % (ref 0–5)
Lymphs Abs: 2.6 10*3/uL (ref 0.7–4.0)
MCH: 30.8 pg (ref 26.0–34.0)
MCHC: 33.3 g/dL (ref 30.0–36.0)
MCV: 92.4 fL (ref 78.0–100.0)
Platelets: 232 10*3/uL (ref 150–400)
RBC: 4.45 MIL/uL (ref 3.87–5.11)

## 2013-03-16 LAB — COMPREHENSIVE METABOLIC PANEL
ALT: 19 U/L (ref 0–35)
BUN: 9 mg/dL (ref 6–23)
Calcium: 9.6 mg/dL (ref 8.4–10.5)
GFR calc Af Amer: 87 mL/min — ABNORMAL LOW (ref >90)
Glucose, Bld: 160 mg/dL — ABNORMAL HIGH (ref 70–99)
Sodium: 138 mEq/L (ref 135–145)
Total Protein: 6.8 g/dL (ref 6.0–8.3)

## 2013-03-16 MED ORDER — SODIUM CHLORIDE 0.9 % IV BOLUS (SEPSIS)
1000.0000 mL | Freq: Once | INTRAVENOUS | Status: AC
  Administered 2013-03-16: 1000 mL via INTRAVENOUS

## 2013-03-16 MED ORDER — AZITHROMYCIN 250 MG PO TABS
500.0000 mg | ORAL_TABLET | Freq: Once | ORAL | Status: AC
  Administered 2013-03-16: 500 mg via ORAL
  Filled 2013-03-16: qty 2

## 2013-03-16 MED ORDER — IPRATROPIUM BROMIDE 0.02 % IN SOLN
0.5000 mg | Freq: Once | RESPIRATORY_TRACT | Status: AC
  Administered 2013-03-16: 0.5 mg via RESPIRATORY_TRACT
  Filled 2013-03-16: qty 2.5

## 2013-03-16 MED ORDER — ALBUTEROL (5 MG/ML) CONTINUOUS INHALATION SOLN
15.0000 mg/h | INHALATION_SOLUTION | Freq: Once | RESPIRATORY_TRACT | Status: AC
  Administered 2013-03-16: 15 mg/h via RESPIRATORY_TRACT
  Filled 2013-03-16: qty 20

## 2013-03-16 MED ORDER — ALBUTEROL SULFATE (5 MG/ML) 0.5% IN NEBU
5.0000 mg | INHALATION_SOLUTION | Freq: Once | RESPIRATORY_TRACT | Status: AC
  Administered 2013-03-16: 5 mg via RESPIRATORY_TRACT
  Filled 2013-03-16: qty 1

## 2013-03-16 MED ORDER — METHYLPREDNISOLONE SODIUM SUCC 125 MG IJ SOLR
125.0000 mg | Freq: Once | INTRAMUSCULAR | Status: AC
  Administered 2013-03-16: 125 mg via INTRAMUSCULAR
  Filled 2013-03-16: qty 2

## 2013-03-16 MED ORDER — SODIUM CHLORIDE 0.9 % IV SOLN: Freq: Once | INTRAVENOUS | Status: DC

## 2013-03-16 NOTE — ED Notes (Signed)
Onset sob yesterday assoc with chills/fever. Increasing distress today

## 2013-03-16 NOTE — ED Provider Notes (Signed)
History  This chart was scribed for Ward Givens, MD by Bennett Scrape, ED Scribe. This patient was seen in room APA07/APA07 and the patient's care was started at 7:44 PM.  CSN: [id number]  Arrival date & time 03/16/13  1932   First MD Initiated Contact with Patient 03/16/13 1944      Chief Complaint  Patient presents with  . Shortness of Breath    Patient is a 57 y.o. female presenting with shortness of breath. The history is provided by the patient. No language interpreter was used.  Shortness of Breath Severity:  Moderate Onset quality:  Gradual Duration:  1 day Timing:  Constant Progression:  Worsening Chronicity:  Chronic Relieved by:  Nothing Worsened by:  Exertion Ineffective treatments:  Oxygen Associated symptoms: chest pain, cough, fever and wheezing   Associated symptoms: no abdominal pain and no vomiting     Cindy [name] is a 57 y.o. female with a h/o COPD who presents to the Emergency Department complaining of gradual onset, gradually worsening, increase of chronic SOB with associated wheezing, nonproductive cough, intermittent left-sided CP under her breast, fever of 102 and chills that started yesterday. She reports taking her temperature before leaving her house to come to the ED. Fever is 100.3 in the ED. She reports trying breathing treatments and cough drops without improvement. She is on 2 L of oxygen at home all the time. She reports that she was given Miralax for constipation recently but denies nausea, emesis, diarrhea as associated symptoms. She is 1 ppd smoker (she states that she takes ger oxygen off and steps outside her home).  Pulmonologist is [place] PCP is Dr. Jeanice Lim  Past Medical History  Diagnosis Date  . COPD (chronic obstructive pulmonary disease)   . Shortness of breath   . Compression fracture of lumbar vertebra   . Hyperglycemia, drug-induced   . Hyperlipidemia   . Osteoporosis 2013  . Tobacco abuse   . Chronic respiratory failure On  home 02 2-3L  . Thyroid disease     pt denies    Past Surgical History  Procedure Laterality Date  . Tubal ligation    . Cervical biopsy      Family History  Problem Relation Age of Onset  . Heart disease Mother   . Hypertension Sister   . Hypertension Brother     History  Substance Use Topics  . Smoking status: Current Every Day Smoker -- 0.50 packs/day for 25 years    Types: Cigarettes  . Smokeless tobacco: Never Used  . Alcohol Use: No  Lives with her landlord    No OB history provided.  Review of Systems  Constitutional: Positive for fever and chills. Negative for fatigue.  Respiratory: Positive for cough, shortness of breath and wheezing. Negative for choking.   Cardiovascular: Positive for chest pain. Negative for leg swelling.  Gastrointestinal: Negative for nausea, vomiting, abdominal pain and diarrhea.  Neurological: Negative for weakness and numbness.  All other systems reviewed and are negative.    Allergies  Doxycycline and Avelox  Home Medications   Current Outpatient Rx  Name  Route  Sig  Dispense  Refill  . albuterol (PROVENTIL HFA;VENTOLIN HFA) 108 (90 BASE) MCG/ACT inhaler   Inhalation   Inhale 2 puffs into the lungs every 4 (four) hours as needed for wheezing.   1 Inhaler   2   . albuterol (PROVENTIL) (2.5 MG/3ML) 0.083% nebulizer solution   Nebulization   Take 2.5 mg by nebulization every 6 (  six) hours as needed for wheezing or shortness of breath.         Marland Kitchen alendronate (FOSAMAX) 70 MG tablet   Oral   Take 70 mg by mouth every 7 (seven) days. Take with a full glass of water on an empty stomach.(wednesday)         . aspirin EC 81 MG tablet   Oral   Take 81 mg by mouth every morning.          . budesonide-formoterol (SYMBICORT) 160-4.5 MCG/ACT inhaler   Inhalation   Inhale 2 puffs into the lungs 2 (two) times daily.   1 Inhaler   11   . Calcium Carbonate-Vitamin D (CALCIUM 600/VITAMIN D) 600-400 MG-UNIT per tablet   Oral    Take 1 tablet by mouth 2 (two) times daily.         . Multiple Vitamin (MULITIVITAMIN WITH MINERALS) TABS   Oral   Take 1 tablet by mouth every morning.          . pantoprazole (PROTONIX) 40 MG tablet   Oral   Take 1 tablet (40 mg total) by mouth daily.   90 tablet   1   . peg 3350 powder (MOVIPREP) 100 G SOLR   Oral   Take 1 kit (100 g total) by mouth once.   1 kit   0   . simvastatin (ZOCOR) 20 MG tablet   Oral   Take 1 tablet (20 mg total) by mouth every evening.   90 tablet   1     Dispense 90 day supply   . tiotropium (SPIRIVA) 18 MCG inhalation capsule   Inhalation   Place 18 mcg into inhaler and inhale every evening. inhale contents of 1 capsule by mouth once daily           Triage Vitals: BP 132/98  Pulse 131  Temp(Src) 100.3 F (37.9 C) (Oral)  Resp 24  Ht 5' 2.5" (1.588 m)  Wt 144 lb (65.318 kg)  BMI 25.9 kg/m2  SpO2 99%  Vital signs normal except for tachycardia and low-grade temp   Physical Exam  Nursing note and vitals reviewed. Constitutional: She is oriented to person, place, and time. She appears well-developed and well-nourished.  Non-toxic appearance. She does not appear ill. No distress.  HENT:  Head: Normocephalic and atraumatic.  Right Ear: External ear normal.  Left Ear: External ear normal.  Nose: Nose normal. No mucosal edema or rhinorrhea.  Mouth/Throat: Oropharynx is clear and moist and mucous membranes are normal. No dental abscesses or edematous.  Eyes: Conjunctivae and EOM are normal. Pupils are equal, round, and reactive to light.  Neck: Normal range of motion and full passive range of motion without pain. Neck supple.  Cardiovascular: Regular rhythm and normal heart sounds.  Tachycardia present.  Exam reveals no gallop and no friction rub.   No murmur heard. Pulmonary/Chest: Accessory muscle usage present. Tachypnea noted. She is in respiratory distress (moderate). She has decreased breath sounds. She has wheezes. She has  rhonchi. She has no rales. She exhibits no tenderness and no crepitus.  Retractions, diminished breath sounds, diffuse wheezing and rhonchi bilaterally   Abdominal: Soft. Normal appearance and bowel sounds are normal. She exhibits no distension. There is no tenderness. There is no rebound and no guarding.  Musculoskeletal: Normal range of motion. She exhibits no edema and no tenderness.  Moves all extremities well.   Neurological: She is alert and oriented to person, place, and time. She has  normal strength. No cranial nerve deficit.  Skin: Skin is warm, dry and intact. No rash noted. No erythema. No pallor.  Psychiatric: She has a normal mood and affect. Her speech is normal and behavior is normal. Her mood appears not anxious.    ED Course  Procedures (including critical care time)  Medications  0.9 %  sodium chloride infusion (not administered)  albuterol (PROVENTIL,VENTOLIN) solution continuous neb (15 mg/hr Nebulization Given 03/16/13 2000)  ipratropium (ATROVENT) nebulizer solution 0.5 mg (0.5 mg Nebulization Given 03/16/13 2000)  methylPREDNISolone sodium succinate (SOLU-MEDROL) 125 mg/2 mL injection 125 mg (125 mg Intramuscular Given 03/16/13 2012)  sodium chloride 0.9 % bolus 1,000 mL (0 mLs Intravenous Stopped 03/16/13 2153)  albuterol (PROVENTIL) (5 MG/ML) 0.5% nebulizer solution 5 mg (5 mg Nebulization Given 03/16/13 2212)  ipratropium (ATROVENT) nebulizer solution 0.5 mg (0.5 mg Nebulization Given 03/16/13 2212)  azithromycin (ZITHROMAX) tablet 500 mg (500 mg Oral Given 03/16/13 2324)    DIAGNOSTIC STUDIES: Oxygen Saturation is 99% on room air, normal by my interpretation. Pt is 100% on 2L O2  COORDINATION OF CARE: 7:54 PM-Discussed treatment plan which includes breathing treatment, CXR, CBC panel and CMP with pt at bedside and pt agreed to plan. Pt has had one nebulizer before my exam, she was given a continuous nebulizer of 15 mg.   10:19 PM- Pt rechecked and is feeling improved  with continous nebulizer and is currently getting another single albuterol breathing treatment. Upon re-exam, she still has a fair amount of diffuse expiratory wheezing. When pt sat up in bed, O2 stats dropped between 87-90%. Discussed admission and pt agreed. Pt reports coughing up yellow phlegm since the starting the breathing treatment.  10:53 PM-Consult complete with Dr. Orvan Falconer. Patient case explained and discussed. Dr. Orvan Falconer agrees to admit patient to tele for further evaluation and treatment. Advises to start PO Zithromax. Call ended at 10:56 PM.  Results for orders placed during the hospital encounter of 03/16/13  CBC WITH DIFFERENTIAL      Result Value Range   WBC 16.4 (*) 4.0 - 10.5 K/uL   RBC 4.45  3.87 - 5.11 MIL/uL   Hemoglobin 13.7  12.0 - 15.0 g/dL   HCT 56.2  13.0 - 86.5 %   MCV 92.4  78.0 - 100.0 fL   MCH 30.8  26.0 - 34.0 pg   MCHC 33.3  30.0 - 36.0 g/dL   RDW 78.4  69.6 - 29.5 %   Platelets 232  150 - 400 K/uL   Neutrophils Relative 76  43 - 77 %   Neutro Abs 12.6 (*) 1.7 - 7.7 K/uL   Lymphocytes Relative 16  12 - 46 %   Lymphs Abs 2.6  0.7 - 4.0 K/uL   Monocytes Relative 7  3 - 12 %   Monocytes Absolute 1.1 (*) 0.1 - 1.0 K/uL   Eosinophils Relative 1  0 - 5 %   Eosinophils Absolute 0.1  0.0 - 0.7 K/uL   Basophils Relative 0  0 - 1 %   Basophils Absolute 0.1  0.0 - 0.1 K/uL  COMPREHENSIVE METABOLIC PANEL      Result Value Range   Sodium 138  135 - 145 mEq/L   Potassium 3.5  3.5 - 5.1 mEq/L   Chloride 100  96 - 112 mEq/L   CO2 30  19 - 32 mEq/L   Glucose, Bld 160 (*) 70 - 99 mg/dL   BUN 9  6 - 23 mg/dL   Creatinine, Ser  0.85  0.50 - 1.10 mg/dL   Calcium 9.6  8.4 - 40.9 mg/dL   Total Protein 6.8  6.0 - 8.3 g/dL   Albumin 3.6  3.5 - 5.2 g/dL   AST 16  0 - 37 U/L   ALT 19  0 - 35 U/L   Alkaline Phosphatase 64  39 - 117 U/L   Total Bilirubin 0.3  0.3 - 1.2 mg/dL   GFR calc non Af Amer 75 (*) >90 mL/min   GFR calc Af Amer 87 (*) >90 mL/min   Laboratory  interpretation all normal except leukocytosis, hyperglycemia   Dg Chest 2 View  03/16/2013  *RADIOLOGY REPORT*  Clinical Data: Increasing chronic shortness of breath.  Wheezing, cough, and intermittent left-sided chest pain.  Fever and chills since yesterday.  CHEST - 2 VIEW  Comparison: 01/16/2013  Findings: Emphysematous changes and scattered fibrosis in the lungs.  Peribronchial thickening suggesting chronic bronchitis.  No focal airspace disease or consolidation.  No blunting of costophrenic angles.  No pneumothorax.  Mediastinal contours appear intact.  No significant change since previous study.  IMPRESSION: Emphysematous changes and chronic bronchitic changes in the lungs. No evidence of active pulmonary disease.   Original Report Authenticated By: Burman Nieves, M.D.         1. COPD with acute exacerbation   2. Hypoxia   3. Bronchitis    Plan admission  CRITICAL CARE Performed by: Devoria Albe L   Total critical care time: 40 min   Critical care time was exclusive of separately billable procedures and treating other patients.  Critical care was necessary to treat or prevent imminent or life-threatening deterioration.  Critical care was time spent personally by me on the following activities: development of treatment plan with patient and/or surrogate as well as nursing, discussions with consultants, evaluation of patient's response to treatment, examination of patient, obtaining history from patient or surrogate, ordering and performing treatments and interventions, ordering and review of laboratory studies, ordering and review of radiographic studies, pulse oximetry and re-evaluation of patient's condition.    MDM   I personally performed the services described in this documentation, which was scribed in my presence. The recorded information has been reviewed and considered.  Devoria Albe, MD, Armando Gang     Ward Givens, MD 03/16/13 2325

## 2013-03-17 ENCOUNTER — Encounter (HOSPITAL_COMMUNITY): Payer: Self-pay | Admitting: Cardiology

## 2013-03-17 DIAGNOSIS — J209 Acute bronchitis, unspecified: Secondary | ICD-10-CM | POA: Diagnosis present

## 2013-03-17 DIAGNOSIS — J9620 Acute and chronic respiratory failure, unspecified whether with hypoxia or hypercapnia: Secondary | ICD-10-CM

## 2013-03-17 DIAGNOSIS — J441 Chronic obstructive pulmonary disease with (acute) exacerbation: Secondary | ICD-10-CM

## 2013-03-17 DIAGNOSIS — F17200 Nicotine dependence, unspecified, uncomplicated: Secondary | ICD-10-CM

## 2013-03-17 DIAGNOSIS — K219 Gastro-esophageal reflux disease without esophagitis: Secondary | ICD-10-CM

## 2013-03-17 LAB — HEPATIC FUNCTION PANEL
ALT: 18 U/L (ref 0–35)
Alkaline Phosphatase: 57 U/L (ref 39–117)
Total Bilirubin: 0.2 mg/dL — ABNORMAL LOW (ref 0.3–1.2)
Total Protein: 6.4 g/dL (ref 6.0–8.3)

## 2013-03-17 LAB — CBC
HCT: 38.5 % (ref 36.0–46.0)
Hemoglobin: 12.9 g/dL (ref 12.0–15.0)
MCV: 92.8 fL (ref 78.0–100.0)
RBC: 4.15 MIL/uL (ref 3.87–5.11)
WBC: 9.5 10*3/uL (ref 4.0–10.5)

## 2013-03-17 LAB — URINALYSIS, ROUTINE W REFLEX MICROSCOPIC
Glucose, UA: 250 mg/dL — AB
Leukocytes, UA: NEGATIVE
Nitrite: NEGATIVE
Specific Gravity, Urine: 1.01 (ref 1.005–1.030)
pH: 6 (ref 5.0–8.0)

## 2013-03-17 LAB — BASIC METABOLIC PANEL
CO2: 24 mEq/L (ref 19–32)
Chloride: 104 mEq/L (ref 96–112)
GFR calc Af Amer: >90 mL/min (ref >90)
Potassium: 3.8 mEq/L (ref 3.5–5.1)

## 2013-03-17 LAB — HEMOGLOBIN A1C: Mean Plasma Glucose: 123 mg/dL — ABNORMAL HIGH (ref <117)

## 2013-03-17 LAB — MAGNESIUM: Magnesium: 1.8 mg/dL (ref 1.5–2.5)

## 2013-03-17 MED ORDER — AZITHROMYCIN 500 MG PO TABS: 500.0000 mg | ORAL_TABLET | Freq: Every day | ORAL | Status: DC

## 2013-03-17 MED ORDER — OXYCODONE HCL 5 MG PO TABS: 5.0000 mg | ORAL_TABLET | ORAL | Status: DC | PRN

## 2013-03-17 MED ORDER — SIMVASTATIN 20 MG PO TABS: 20.0000 mg | ORAL_TABLET | Freq: Every evening | ORAL | Status: DC

## 2013-03-17 MED ORDER — FLEET ENEMA 7-19 GM/118ML RE ENEM: 1.0000 | ENEMA | Freq: Once | RECTAL | Status: DC | PRN

## 2013-03-17 MED ORDER — ASPIRIN EC 81 MG PO TBEC
81.0000 mg | DELAYED_RELEASE_TABLET | Freq: Every morning | ORAL | Status: DC
  Administered 2013-03-17: 81 mg via ORAL
  Filled 2013-03-17: qty 1

## 2013-03-17 MED ORDER — ENOXAPARIN SODIUM 40 MG/0.4ML ~~LOC~~ SOLN
40.0000 mg | SUBCUTANEOUS | Status: DC
  Administered 2013-03-17: 40 mg via SUBCUTANEOUS
  Filled 2013-03-17: qty 0.4

## 2013-03-17 MED ORDER — NICOTINE 21 MG/24HR TD PT24: 1.0000 | MEDICATED_PATCH | Freq: Every day | TRANSDERMAL | Status: DC

## 2013-03-17 MED ORDER — ALBUTEROL SULFATE (5 MG/ML) 0.5% IN NEBU: 2.5000 mg | INHALATION_SOLUTION | Freq: Four times a day (QID) | RESPIRATORY_TRACT | Status: DC

## 2013-03-17 MED ORDER — HYDROMORPHONE HCL PF 1 MG/ML IJ SOLN: 0.5000 mg | INTRAMUSCULAR | Status: DC | PRN

## 2013-03-17 MED ORDER — SODIUM CHLORIDE 0.9 % IJ SOLN
3.0000 mL | Freq: Two times a day (BID) | INTRAMUSCULAR | Status: DC
  Administered 2013-03-17: 3 mL via INTRAVENOUS
  Filled 2013-03-17: qty 3

## 2013-03-17 MED ORDER — METHYLPREDNISOLONE SODIUM SUCC 125 MG IJ SOLR
125.0000 mg | Freq: Four times a day (QID) | INTRAMUSCULAR | Status: DC
  Administered 2013-03-17: 125 mg via INTRAVENOUS
  Filled 2013-03-17: qty 2

## 2013-03-17 MED ORDER — ONDANSETRON HCL 4 MG/2ML IJ SOLN: 4.0000 mg | INTRAMUSCULAR | Status: DC | PRN

## 2013-03-17 MED ORDER — POLYETHYLENE GLYCOL 3350 17 G PO PACK: 17.0000 g | PACK | Freq: Every day | ORAL | Status: DC | PRN

## 2013-03-17 MED ORDER — GUAIFENESIN ER 600 MG PO TB12
1200.0000 mg | ORAL_TABLET | Freq: Two times a day (BID) | ORAL | Status: DC
  Administered 2013-03-17 (×2): 1200 mg via ORAL
  Filled 2013-03-17 (×2): qty 2

## 2013-03-17 MED ORDER — SORBITOL 70 % SOLN: 30.0000 mL | Freq: Every day | Status: DC | PRN

## 2013-03-17 MED ORDER — NICOTINE 21 MG/24HR TD PT24: 21.0000 mg | MEDICATED_PATCH | Freq: Every day | TRANSDERMAL | Status: DC

## 2013-03-17 MED ORDER — IPRATROPIUM BROMIDE 0.02 % IN SOLN: 0.5000 mg | RESPIRATORY_TRACT | Status: DC | PRN

## 2013-03-17 MED ORDER — TRAZODONE HCL 50 MG PO TABS: 25.0000 mg | ORAL_TABLET | Freq: Every evening | ORAL | Status: DC | PRN

## 2013-03-17 MED ORDER — LEVALBUTEROL HCL 1.25 MG/0.5ML IN NEBU
1.2500 mg | INHALATION_SOLUTION | Freq: Four times a day (QID) | RESPIRATORY_TRACT | Status: DC
  Administered 2013-03-17: 1.25 mg via RESPIRATORY_TRACT
  Filled 2013-03-17: qty 0.5

## 2013-03-17 MED ORDER — IPRATROPIUM BROMIDE 0.02 % IN SOLN
0.5000 mg | Freq: Four times a day (QID) | RESPIRATORY_TRACT | Status: DC
  Administered 2013-03-17: 0.5 mg via RESPIRATORY_TRACT
  Filled 2013-03-17: qty 2.5

## 2013-03-17 MED ORDER — POTASSIUM CHLORIDE IN NACL 20-0.9 MEQ/L-% IV SOLN
INTRAVENOUS | Status: DC
  Administered 2013-03-17: 04:00:00 via INTRAVENOUS

## 2013-03-17 MED ORDER — ACETAMINOPHEN 325 MG PO TABS: 650.0000 mg | ORAL_TABLET | ORAL | Status: DC | PRN

## 2013-03-17 MED ORDER — AZITHROMYCIN 250 MG PO TABS: 500.0000 mg | ORAL_TABLET | Freq: Every day | ORAL | Status: DC

## 2013-03-17 MED ORDER — OXYCODONE HCL 5 MG PO TABS
5.0000 mg | ORAL_TABLET | ORAL | Status: DC | PRN
  Administered 2013-03-17 (×2): 5 mg via ORAL
  Filled 2013-03-17 (×2): qty 1

## 2013-03-17 MED ORDER — NICOTINE 21 MG/24HR TD PT24
21.0000 mg | MEDICATED_PATCH | Freq: Every day | TRANSDERMAL | Status: DC
  Administered 2013-03-17: 21 mg via TRANSDERMAL
  Filled 2013-03-17: qty 1

## 2013-03-17 MED ORDER — LEVALBUTEROL HCL 1.25 MG/0.5ML IN NEBU: 1.2500 mg | INHALATION_SOLUTION | RESPIRATORY_TRACT | Status: DC | PRN

## 2013-03-17 MED ORDER — PANTOPRAZOLE SODIUM 40 MG PO TBEC
40.0000 mg | DELAYED_RELEASE_TABLET | Freq: Every day | ORAL | Status: DC
  Administered 2013-03-17: 40 mg via ORAL
  Filled 2013-03-17: qty 1

## 2013-03-17 MED ORDER — BUDESONIDE-FORMOTEROL FUMARATE 160-4.5 MCG/ACT IN AERO
2.0000 | INHALATION_SPRAY | Freq: Two times a day (BID) | RESPIRATORY_TRACT | Status: DC
  Administered 2013-03-17: 2 via RESPIRATORY_TRACT
  Filled 2013-03-17: qty 6

## 2013-03-17 MED ORDER — PREDNISONE 20 MG PO TABS: ORAL_TABLET | ORAL | Status: DC

## 2013-03-17 NOTE — Telephone Encounter (Signed)
Noted that patient was recently inpatient.

## 2013-03-17 NOTE — Progress Notes (Signed)
Patient discharged home.  Instructed on new meds and how to take them. Carenotes given as well on steroids, abx, and COPD, and smoking cessation.  Follow up appointments in place.  Respiratory meds from patient drawer given to patient.  IV removed - WNL.  Patient has no questions at this time, and is waiting for ride to arrive.

## 2013-03-17 NOTE — Progress Notes (Signed)
UR Chart Review Completed  

## 2013-03-17 NOTE — Discharge Summary (Signed)
Physician Discharge Summary  Cindy [name] ZOX:[id number] DOB: 1956/07/04 DOA: 03/16/2013  PCP: Milinda Antis, MD  Admit date: 03/16/2013 Discharge date: 03/17/2013  Time spent: Greater than 30 minutes  Recommendations for Outpatient Follow-up:  1. Follow with primary care physician in one week.   Discharge Diagnoses: 1. COPD exacerbation, oxygen dependent, improving. 2. Ongoing tobacco abuse. 3. Lumbar back pain with history of recently discovered L3 compression fracture, probably not acute.   Discharge Condition: Stable.  Diet recommendation: Regular.  Filed Weights   03/16/13 1945 03/17/13 0104  Weight: 65.318 kg (144 lb) 66.5 kg (146 lb 9.7 oz)    History of present illness:  This 57 year old lady presents to the hospital yesterday with symptoms of productive cough and dyspnea for 2 days. Please see initial history as outlined below: Cindy [name] is an 57 y.o. female. Middle-aged Caucasian lady with severe COPD, maintained on 2 L of home O2, has been having cough and wheezing since yesterday; she was not helped by cough drops and she noted a temperature as high as 102 at home today. She has similar not helped by home nebulizations and eventually came to the emergency room for assistance.  Cough is productive of yellow-green sputum but no blood; she denies sick contacts; she continues to smoke a half pack of cigarettes per day.  In the emergency room she was noted to be hypoxic in the high 80s while on oxygen and receiving nebs; she continued to wheeze despite 3 rounds of neb  2 weeks ago she took a 5 day taper of prednisone at back pain; and was recently diagnosed with an L3 lumbar compression fracture.  Hospital Course:  The patient was admitted and started on intravenous steroids, bronchodilators and Zithromax as the antibiotic of choice. Chest x-ray did not show presence of pneumonia. She had no fever. This morning, she is somewhat better and came to go home. She feels that  she can manage at home. She has no fever and her white count is not elevated.  Procedures:  None.   Consultations:  None.  Discharge Exam: Filed Vitals:   03/17/13 0025 03/17/13 0104 03/17/13 0511 03/17/13 0704  BP: 101/56 117/70 128/72   Pulse: 102 115 88   Temp:  98.5 F (36.9 C) 98.4 F (36.9 C)   TempSrc:  Oral Oral   Resp: 18 24 20    Height:  5' 2.5" (1.588 m)    Weight:  66.5 kg (146 lb 9.7 oz)    SpO2: 94% 90% 96% 93%    General: She looks systemically well. She is not toxic or septic. There is no peripheral or central cyanosis. She does not appear to have increased work of breathing at rest. There is no clubbing. Cardiovascular: Heart sounds are present and in sinus rhythm. There is no gallop rhythm. Jugular venous pressure not raised. There is no right ventricular she. There is no peripheral pitting edema. Respiratory: Lung fields show bilateral wheezing, not a tickly type, likely chronic. No bronchial breathing. No crackles. She is alert and orientated  Discharge Instructions  Discharge Orders   Future Appointments Provider Department Dept Phone   04/14/2013 8:30 AM [name], MD [facility] [phone]   04/22/2013 1:30 PM Leslye Peer, MD [facility] [phone]   Future Orders Complete By Expires     Diet - low sodium heart healthy  As directed     Increase activity slowly  As directed  Medication List    TAKE these medications       albuterol 108 (90 BASE) MCG/ACT inhaler  Commonly known as:  PROVENTIL HFA;VENTOLIN HFA  Inhale 2 puffs into the lungs every 4 (four) hours as needed for wheezing.     albuterol (2.5 MG/3ML) 0.083% nebulizer solution  Commonly known as:  PROVENTIL  Take 2.5 mg by nebulization every 6 (six) hours as needed for wheezing or shortness of breath.     alendronate 70 MG tablet  Commonly known as:  FOSAMAX  Take 70 mg by mouth every 7 (seven) days. Take with a full glass of water on an  empty stomach.(wednesday)     aspirin EC 81 MG tablet  Take 81 mg by mouth every morning.     azithromycin 500 MG tablet  Commonly known as:  ZITHROMAX  Take 1 tablet (500 mg total) by mouth daily.     budesonide-formoterol 160-4.5 MCG/ACT inhaler  Commonly known as:  SYMBICORT  Inhale 2 puffs into the lungs 2 (two) times daily.     CALCIUM 600/VITAMIN D 600-400 MG-UNIT per tablet  Generic drug:  Calcium Carbonate-Vitamin D  Take 1 tablet by mouth 2 (two) times daily.     multivitamin with minerals Tabs  Take 1 tablet by mouth every morning.     nicotine 21 mg/24hr patch  Commonly known as:  NICODERM CQ - dosed in mg/24 hours  Place 1 patch onto the skin daily.     oxyCODONE 5 MG immediate release tablet  Commonly known as:  Oxy IR/ROXICODONE  Take 1 tablet (5 mg total) by mouth every 4 (four) hours as needed.     pantoprazole 40 MG tablet  Commonly known as:  PROTONIX  Take 1 tablet (40 mg total) by mouth daily.     peg 3350 powder 100 G Solr  Commonly known as:  MOVIPREP  Take 1 kit (100 g total) by mouth once.     predniSONE 20 MG tablet  Commonly known as:  DELTASONE  Take 2 tablets daily for 3 days, then 1 tablet daily for 3 days, then half tablet daily for 3 days, then STOP.     simvastatin 20 MG tablet  Commonly known as:  ZOCOR  Take 1 tablet (20 mg total) by mouth every evening.     tiotropium 18 MCG inhalation capsule  Commonly known as:  SPIRIVA  Place 18 mcg into inhaler and inhale every evening. inhale contents of 1 capsule by mouth once daily          The results of significant diagnostics from this hospitalization (including imaging, microbiology, ancillary and laboratory) are listed below for reference.    Significant Diagnostic Studies: Dg Chest 2 View  03/16/2013  *RADIOLOGY REPORT*  Clinical Data: Increasing chronic shortness of breath.  Wheezing, cough, and intermittent left-sided chest pain.  Fever and chills since yesterday.  CHEST - 2  VIEW  Comparison: 01/16/2013  Findings: Emphysematous changes and scattered fibrosis in the lungs.  Peribronchial thickening suggesting chronic bronchitis.  No focal airspace disease or consolidation.  No blunting of costophrenic angles.  No pneumothorax.  Mediastinal contours appear intact.  No significant change since previous study.  IMPRESSION: Emphysematous changes and chronic bronchitic changes in the lungs. No evidence of active pulmonary disease.   Original Report Authenticated By: Burman Nieves, M.D.    Dg Lumbar Spine Complete  03/03/2013  *RADIOLOGY REPORT*  Clinical Data: Back pain  LUMBAR SPINE - COMPLETE 4+ VIEW  Comparison:  07/29/2010  Findings: Moderately severe compression fracture of L3, with mild progression from the prior study.  MRI would be necessary to determine if there is an acute on chronic component to the fracture.  No other fractures.  Normal lumbar alignment.  Negative for pars defect or mass lesion.  IMPRESSION: Moderate compression fracture of L3, with progression since 2011. This may all be chronic.  If the patient has acute symptoms in this area, MRI may be helpful to determine if  there is superimposed acute fracture of L3.   Original Report Authenticated By: Janeece Riggers, M.D.    Mr Lumbar Spine Wo Contrast  03/08/2013  *RADIOLOGY REPORT*  Clinical Data: Low back pain radiating to both legs.  MRI LUMBAR SPINE WITHOUT CONTRAST  Technique:  Multiplanar and multiecho pulse sequences of the lumbar spine were obtained without intravenous contrast.  Comparison: 03/03/2013 plain film examination.  07/18/2010 MR.  Findings: Last fully open disc space is labeled L5-S1.  Present examination incorporates from the T10-11 disc space through lower sacrum.  Conus T12-L1 level. Linear increased signal within the distal thoracic cord / conus probably related to artifact.  Left adrenal gland enlargement.  This can be evaluated with dedicated adrenal MRI.  Left L4 vertebra hemangioma without  significant change.  Anterior wedge compression deformity of the L3 vertebral body involving the inferior endplate greater to the right of midline. 50% loss of height maximally right lateral position.  No associated edema and therefore this is remote.  There is minimal retropulsion of posterior inferior aspect of the L3 vertebral body with associated L3-4 bulge no significant spinal stenosis.  Mild bilateral L3-4 foraminal narrowing without nerve root compression.  T10-11:  Minimal Schmorl's node deformity superior plate O96.  E95-28:  Negative.  T12-L1:  Negative.  L1-2: Negative.  L2-3:  Minimal bulge left lateral position without nerve root compression.  Minimal facet joint degenerative changes.  L3-4:  As above.  L4-5:  Bulge / spur slightly greater right foraminal position without nerve root compression.  Mild facet joint degenerative changes.  L5-S1:  Facet joint degenerative change greater on the right. Fluid undermining ligamentum flavum hypertrophy on the left without well- defined synovial cyst .  Bulge with superimposed central/right paracentral disc protrusion with impression upon the ventral aspect of the thecal sac slightly greater to the right just above the takeoff of the S1 nerve roots.  IMPRESSION:  Progressive compression deformity inferior aspect of the L3 vertebral body as detailed above. This appears remote without edema to suggest acute injury.  Mild progressive degenerative changes L3-4 through L5-S1 with findings most notable at the L5-S1 level as detailed above.  Enlarged left adrenal gland.  This can be assessed with dedicated adrenal gland MR imaging.   Original Report Authenticated By: Lacy Duverney, M.D.    Mm Digital Screening  03/08/2013  *RADIOLOGY REPORT*  Clinical Data: Screening.  DIGITAL BILATERAL SCREENING MAMMOGRAM WITH CAD  Comparison:  Previous exams.  FINDINGS:  ACR Breast Density Category 2: There is a scattered fibroglandular pattern.  No suspicious masses, architectural  distortion, or calcifications are present.  Images were processed with CAD.  IMPRESSION: No mammographic evidence of malignancy.  A result letter of this screening mammogram will be mailed directly to the patient.  RECOMMENDATION: Screening mammogram in one year. (Code:SM-B-01Y)  BI-RADS CATEGORY 1:  Negative.   Original Report Authenticated By: Rolla Plate, M.D.         Labs: Basic Metabolic Panel:  Recent Labs Lab 03/16/13 2000 03/17/13  2841 03/17/13 0522  NA 138  --  139  K 3.5  --  3.8  CL 100  --  104  CO2 30  --  24  GLUCOSE 160*  --  153*  BUN 9  --  8  CREATININE 0.85  --  0.68  CALCIUM 9.6  --  9.0  MG  --  1.8  --    Liver Function Tests:  Recent Labs Lab 03/16/13 2000 03/17/13 0317  AST 16 16  ALT 19 18  ALKPHOS 64 57  BILITOT 0.3 0.2*  PROT 6.8 6.4  ALBUMIN 3.6 3.4*     CBC:  Recent Labs Lab 03/16/13 2000 03/17/13 0522  WBC 16.4* 9.5  NEUTROABS 12.6*  --   HGB 13.7 12.9  HCT 41.1 38.5  MCV 92.4 92.8  PLT 232 235     BNP: BNP (last 3 results)  Recent Labs  06/27/12 0837  PROBNP 61.8         Signed:  Brondon Wann C  [facility] 03/17/2013, 9:59 AM

## 2013-03-17 NOTE — H&P (Signed)
[facility] History and Physical  Cindy [name]  ZOX:[id number]  DOB: 1956/05/09   DOA: 03/17/2013   PCP:   Milinda Antis, MD Pulmonologist: Dr. Solon Augusta   Chief Complaint:  Cough and shortness of breath for 2 days  HPI: Cindy [name] is an 57 y.o. female.  Middle-aged Caucasian lady with severe COPD, maintained on 2 L of home O2, has been having cough and wheezing since yesterday; she was not helped by cough drops and she noted a temperature as high as 102 at home today. She has similar not helped by home nebulizations and eventually came to the emergency room for assistance. Cough is productive of yellow-green sputum but no blood; she denies sick contacts; she continues to smoke a half pack of cigarettes per day.  In the emergency room she was noted to be hypoxic in the high 80s while on oxygen and receiving nebs; she continued to wheeze despite 3 rounds of neb  2 weeks ago she took a 5 day taper of prednisone at back pain; and was recently diagnosed with an L3 lumbar compression fracture.  Rewiew of Systems:   All systems negative except as marked bold or noted in the HPI;  Constitutional:    malaise, fever and chills. ;  Eyes:   eye pain, redness and discharge. ;  ENMT:   ear pain, hoarseness, nasal congestion, sinus pressure and sore throat. ;  Cardiovascular:    chest pain, palpitations, diaphoresis, dyspnea and peripheral edema.  Respiratory:   cough, hemoptysis, wheezing and stridor. ;  Gastrointestinal:  nausea, vomiting, diarrhea, constipation, abdominal pain, melena, blood in stool, hematemesis, jaundice and rectal bleeding. unusual weight loss..   Genitourinary:    frequency, dysuria, incontinence,flank pain and hematuria; Musculoskeletal:   back pain and neck pain.  swelling and trauma.;  Skin: .  pruritus, rash, abrasions, bruising and skin lesion.; ulcerations Neuro:    headache, lightheadedness and neck stiffness.  weakness, altered level of consciousness, altered  mental status, extremity weakness, burning feet, involuntary movement, seizure and syncope.  Psych:    anxiety, depression, insomnia, tearfulness, panic attacks, hallucinations, paranoia, suicidal or homicidal ideation    Past Medical History  Diagnosis Date  . COPD (chronic obstructive pulmonary disease)   . Shortness of breath   . Compression fracture of lumbar vertebra   . Hyperglycemia, drug-induced   . Hyperlipidemia   . Osteoporosis 2013  . Tobacco abuse   . Chronic respiratory failure On home 02 2-3L  . Thyroid disease     pt denies    Past Surgical History  Procedure Laterality Date  . Tubal ligation    . Cervical biopsy      Medications:  HOME MEDS: Prior to Admission medications   Medication Sig Start Date End Date Taking? Authorizing Provider  albuterol (PROVENTIL HFA;VENTOLIN HFA) 108 (90 BASE) MCG/ACT inhaler Inhale 2 puffs into the lungs every 4 (four) hours as needed for wheezing. 07/28/12 07/28/13 Yes [name], MD  albuterol (PROVENTIL) (2.5 MG/3ML) 0.083% nebulizer solution Take 2.5 mg by nebulization every 6 (six) hours as needed for wheezing or shortness of breath. 01/10/13 01/10/14 Yes [name], MD  alendronate (FOSAMAX) 70 MG tablet Take 70 mg by mouth every 7 (seven) days. Take with a full glass of water on an empty stomach.Lulu Riding) 03/03/13 03/03/14 Yes [name], MD  aspirin EC 81 MG tablet Take 81 mg by mouth every morning.    Yes Historical Provider, MD  budesonide-formoterol (SYMBICORT) 160-4.5 MCG/ACT  inhaler Inhale 2 puffs into the lungs 2 (two) times daily. 09/07/12 09/07/13 Yes [name], MD  Calcium Carbonate-Vitamin D (CALCIUM 600/VITAMIN D) 600-400 MG-UNIT per tablet Take 1 tablet by mouth 2 (two) times daily.   Yes Historical Provider, MD  Multiple Vitamin (MULITIVITAMIN WITH MINERALS) TABS Take 1 tablet by mouth every morning.    Yes Historical Provider, MD  pantoprazole (PROTONIX) 40 MG tablet Take 1 tablet (40 mg total)  by mouth daily. 01/10/13  Yes [name], MD  peg 3350 powder (MOVIPREP) 100 G SOLR Take 1 kit (100 g total) by mouth once. 02/17/13  Yes Len Blalock, NP  simvastatin (ZOCOR) 20 MG tablet Take 1 tablet (20 mg total) by mouth every evening. 09/07/12 09/07/13 Yes [name], MD  tiotropium (SPIRIVA) 18 MCG inhalation capsule Place 18 mcg into inhaler and inhale every evening. inhale contents of 1 capsule by mouth once daily 03/03/13  Yes [name], MD     Allergies:  Allergies  Allergen Reactions  . Doxycycline Other (See Comments)    Gave patient oral thrush  . Avelox (Moxifloxacin Hcl In Nacl) Itching and Rash    Social History:   reports that she has been smoking Cigarettes.  She has a 12.5 pack-year smoking history. She has never used smokeless tobacco. She reports that she does not drink alcohol or use illicit drugs.  Family History: Family History  Problem Relation Age of Onset  . Heart disease Mother   . Hypertension Sister   . Hypertension Brother      Physical Exam: Filed Vitals:   03/16/13 2010 03/16/13 2214 03/17/13 0025 03/17/13 0104  BP: 131/65  101/56 117/70  Pulse: 132  102 115  Temp:    98.5 F (36.9 C)  TempSrc:    Oral  Resp: 15  18 24   Height:    5' 2.5" (1.588 m)  Weight:    66.5 kg (146 lb 9.7 oz)  SpO2: 100% 100% 94% 90%   Blood pressure 117/70, pulse 115, temperature 98.5 F (36.9 C), temperature source Oral, resp. rate 24, height 5' 2.5" (1.588 m), weight 66.5 kg (146 lb 9.7 oz), SpO2 90.00%.  GEN:  Pleasant Caucasian lady sitting upin bed with audible wheezing cooperative with exam PSYCH:  alert and oriented x4; mild anxiety; affect is appropriate. HEENT: Mucous membranes pink and anicteric; PERRLA; EOM intact; no cervical lymphadenopathy nor thyromegaly or carotid bruit; no JVD; Breasts:: Not examined CHEST WALL: No tenderness CHEST: Tachypneic; prolonged expiration; poor air movement; bilateral end-expiratory wheezing HEART:  Tachycardic Regular rate and rhythm; no murmurs rubs or gallops BACK: No kyphosis no scoliosis; no CVA tenderness ABDOMEN:  soft non-tender; no masses, no organomegaly, normal abdominal bowel sounds; no pannus; no intertriginous candida. Rectal Exam: Not done EXTREMITIES:  age-appropriate arthropathy of the hands and knees; no edema; no ulcerations. Genitalia: not examined PULSES: 2+ and symmetric SKIN: Normal hydration no rash or ulceration CNS: Cranial nerves 2-12 grossly intact no focal lateralizing neurologic deficit   Labs on Admission:  Basic Metabolic Panel:  Recent Labs Lab 03/16/13 2000  NA 138  K 3.5  CL 100  CO2 30  GLUCOSE 160*  BUN 9  CREATININE 0.85  CALCIUM 9.6   Liver Function Tests:  Recent Labs Lab 03/16/13 2000  AST 16  ALT 19  ALKPHOS 64  BILITOT 0.3  PROT 6.8  ALBUMIN 3.6   No results found for this basename: LIPASE, AMYLASE,  in the last 168  hours No results found for this basename: AMMONIA,  in the last 168 hours CBC:  Recent Labs Lab 03/16/13 2000  WBC 16.4*  NEUTROABS 12.6*  HGB 13.7  HCT 41.1  MCV 92.4  PLT 232   Cardiac Enzymes: No results found for this basename: CKTOTAL, CKMB, CKMBINDEX, TROPONINI,  in the last 168 hours BNP: No components found with this basename: POCBNP,  D-dimer: No components found with this basename: D-DIMER,  CBG: No results found for this basename: GLUCAP,  in the last 168 hours  Radiological Exams on Admission: Dg Chest 2 View  03/16/2013  *RADIOLOGY REPORT*  Clinical Data: Increasing chronic shortness of breath.  Wheezing, cough, and intermittent left-sided chest pain.  Fever and chills since yesterday.  CHEST - 2 VIEW  Comparison: 01/16/2013  Findings: Emphysematous changes and scattered fibrosis in the lungs.  Peribronchial thickening suggesting chronic bronchitis.  No focal airspace disease or consolidation.  No blunting of costophrenic angles.  No pneumothorax.  Mediastinal contours appear intact.   No significant change since previous study.  IMPRESSION: Emphysematous changes and chronic bronchitic changes in the lungs. No evidence of active pulmonary disease.   Original Report Authenticated By: Burman Nieves, M.D.      Assessment/Plan Present on Admission:  . Acute-on-chronic respiratory failure . Hypoxia . COPD exacerbation . Bronchitis . Tobacco abuse . Lumbar back pain . GERD (gastroesophageal reflux disease)   We'll admit this lady for spiritual support and pulmonary toilet Oxygen supplementation and serial nebulizations; will favor Xopenex rather than the albuterol because of her extreme Will give oral is Zithromax, for bronchitis since yesterday x-ray shows no pneumonia; round-the-clock  tachycardia in the emergency room, Counsel once again on nicotine cessation and offer nicotine patch Proton pump inhibitor while on Solu-Medrol  Other plans as per orders.  Code Status:FULL CODE  Family Communication: Discussed with patient at bedside Disposition Plan: Likely discharge home  Parley Pidcock Nocturnist [facility]  Pager [phone]   03/17/2013, 3:25 AM

## 2013-03-18 ENCOUNTER — Encounter (HOSPITAL_COMMUNITY): Payer: Self-pay | Admitting: Pharmacy Technician

## 2013-03-21 LAB — CULTURE, BLOOD (ROUTINE X 2): Culture: NO GROWTH

## 2013-03-22 ENCOUNTER — Encounter: Payer: Self-pay | Admitting: Family Medicine

## 2013-03-22 ENCOUNTER — Ambulatory Visit (INDEPENDENT_AMBULATORY_CARE_PROVIDER_SITE_OTHER): Payer: Medicaid Other | Admitting: Family Medicine

## 2013-03-22 VITALS — BP 126/74 | HR 86 | Resp 20 | Ht 63.0 in | Wt 144.1 lb

## 2013-03-22 DIAGNOSIS — F17200 Nicotine dependence, unspecified, uncomplicated: Secondary | ICD-10-CM

## 2013-03-22 DIAGNOSIS — J441 Chronic obstructive pulmonary disease with (acute) exacerbation: Secondary | ICD-10-CM

## 2013-03-22 DIAGNOSIS — Q891 Congenital malformations of adrenal gland: Secondary | ICD-10-CM

## 2013-03-22 DIAGNOSIS — Z720 Tobacco use: Secondary | ICD-10-CM

## 2013-03-22 NOTE — Patient Instructions (Addendum)
Get the CT scan done for adrenal  Take the mucinex twice a day Take the prednisone as directed Use your oxygen 24 hours Keep f/u appt in April

## 2013-03-23 ENCOUNTER — Encounter: Payer: Self-pay | Admitting: Family Medicine

## 2013-03-23 NOTE — Assessment & Plan Note (Signed)
unchanged

## 2013-03-23 NOTE — Assessment & Plan Note (Signed)
Improved with treatment, no further changes on prednisone taper

## 2013-03-23 NOTE — Progress Notes (Signed)
  Subjective:    Patient ID: Cindy [name], female    DOB: 09-19-1956, 57 y.o.   MRN: [id number]  HPI  Pt here for hospital f/u for COPD admission, improved but continues to have cough and DOE,she is wearing oxygen most of days, no change in smoking. Currently on prednisone taper.  Has f/u with Dr. Eduard Clos for compression fracture seen on MRI CT of adrenal glands to be done due to enlargement on left side   Review of Systems   GEN- denies fatigue, fever, weight loss,weakness, recent illness HEENT- denies eye drainage, change in vision, nasal discharge, CVS- denies chest pain, palpitations RESP- + SOB,+ cough, +wheeze ABD- denies N/V, change in stools, abd pain GU- denies dysuria, hematuria, dribbling, incontinence MSK- + joint pain, muscle aches, injury Neuro- denies headache, dizziness, syncope, seizure activity      Objective:   Physical Exam GEN- NAD, alert and oriented x3 HEENT- PERRL, EOMI, non injected sclera, pink conjunctiva, MMM, oropharynx clear Neck- Supple, no LAD CVS- RRR, no murmur RESP-bilateral wheeze, fair air movement, speaking at baseline, no retractions, normal WOB EXT- No edema Pulses- Radial 2+        Assessment & Plan:

## 2013-03-23 NOTE — Assessment & Plan Note (Signed)
CT to be done, r/u tumor/mass/cyst,

## 2013-03-24 ENCOUNTER — Ambulatory Visit (HOSPITAL_COMMUNITY)
Admission: RE | Admit: 2013-03-24 | Discharge: 2013-03-24 | Disposition: A | Discharge status: Home / Self Care | Payer: Medicaid Other | Source: Ambulatory Visit | Attending: Family Medicine | Admitting: Family Medicine

## 2013-03-24 ENCOUNTER — Other Ambulatory Visit: Payer: Self-pay | Admitting: Family Medicine

## 2013-03-24 ENCOUNTER — Ambulatory Visit: Payer: Medicaid Other | Admitting: Family Medicine

## 2013-03-24 DIAGNOSIS — E279 Disorder of adrenal gland, unspecified: Secondary | ICD-10-CM

## 2013-03-24 DIAGNOSIS — Q891 Congenital malformations of adrenal gland: Secondary | ICD-10-CM

## 2013-03-24 DIAGNOSIS — N200 Calculus of kidney: Secondary | ICD-10-CM | POA: Insufficient documentation

## 2013-03-29 ENCOUNTER — Telehealth: Payer: Self-pay | Admitting: Family Medicine

## 2013-03-29 DIAGNOSIS — M4856XG Collapsed vertebra, not elsewhere classified, lumbar region, subsequent encounter for fracture with delayed healing: Secondary | ICD-10-CM

## 2013-03-29 DIAGNOSIS — D3502 Benign neoplasm of left adrenal gland: Secondary | ICD-10-CM

## 2013-03-29 DIAGNOSIS — N200 Calculus of kidney: Secondary | ICD-10-CM

## 2013-03-29 NOTE — Telephone Encounter (Signed)
Please cancel the appt for Dr. Gerilyn Pilgrim this was the wrong referral, pt was to be seen May 9th She has neurosurgery appt and Urology now in work que

## 2013-03-30 MED ORDER — SODIUM CHLORIDE 0.45 % IV SOLN: INTRAVENOUS | Status: DC

## 2013-03-30 NOTE — Telephone Encounter (Signed)
She is going to Dr. Gerilyn Pilgrim for her back because Dr. Eduard Clos does not take Medicaid is this allright

## 2013-03-30 NOTE — Telephone Encounter (Signed)
Cindy [name] has referred her Dr. Gerilyn Pilgrim please explain to her please

## 2013-03-30 NOTE — Telephone Encounter (Signed)
No, she needs to see neurosurgery it is a compression fracture, she does not need chronic pain management. Dr. Eduard Clos handles compression fractures as well- but no medicaid

## 2013-03-30 NOTE — Telephone Encounter (Signed)
Sallie has sent the papers to [facility]

## 2013-03-31 ENCOUNTER — Encounter (HOSPITAL_COMMUNITY)
Admission: RE | Disposition: A | Discharge status: Home / Self Care | Payer: Self-pay | Source: Ambulatory Visit | Attending: Internal Medicine

## 2013-03-31 ENCOUNTER — Ambulatory Visit (HOSPITAL_COMMUNITY)
Admission: RE | Admit: 2013-03-31 | Discharge: 2013-03-31 | Disposition: A | Discharge status: Home / Self Care | Payer: Medicaid Other | Source: Ambulatory Visit | Attending: Internal Medicine | Admitting: Internal Medicine

## 2013-03-31 ENCOUNTER — Telehealth: Payer: Self-pay | Admitting: Family Medicine

## 2013-03-31 ENCOUNTER — Encounter (HOSPITAL_COMMUNITY): Payer: Self-pay | Admitting: *Deleted

## 2013-03-31 DIAGNOSIS — Z1211 Encounter for screening for malignant neoplasm of colon: Secondary | ICD-10-CM | POA: Insufficient documentation

## 2013-03-31 DIAGNOSIS — J449 Chronic obstructive pulmonary disease, unspecified: Secondary | ICD-10-CM | POA: Insufficient documentation

## 2013-03-31 DIAGNOSIS — K5730 Diverticulosis of large intestine without perforation or abscess without bleeding: Secondary | ICD-10-CM

## 2013-03-31 DIAGNOSIS — D126 Benign neoplasm of colon, unspecified: Secondary | ICD-10-CM | POA: Insufficient documentation

## 2013-03-31 DIAGNOSIS — J4489 Other specified chronic obstructive pulmonary disease: Secondary | ICD-10-CM | POA: Insufficient documentation

## 2013-03-31 HISTORY — PX: COLONOSCOPY: SHX5424

## 2013-03-31 SURGERY — COLONOSCOPY
Anesthesia: Moderate Sedation

## 2013-03-31 MED ORDER — MIDAZOLAM HCL 5 MG/5ML IJ SOLN
INTRAMUSCULAR | Status: DC | PRN
  Administered 2013-03-31: 1 mg via INTRAVENOUS
  Administered 2013-03-31 (×2): 2 mg via INTRAVENOUS

## 2013-03-31 MED ORDER — STERILE WATER FOR IRRIGATION IR SOLN
Status: DC | PRN
  Administered 2013-03-31: 08:00:00

## 2013-03-31 MED ORDER — MEPERIDINE HCL 50 MG/ML IJ SOLN
INTRAMUSCULAR | Status: DC | PRN
  Administered 2013-03-31 (×2): 25 mg via INTRAVENOUS

## 2013-03-31 MED ORDER — MEPERIDINE HCL 50 MG/ML IJ SOLN
INTRAMUSCULAR | Status: AC
  Filled 2013-03-31: qty 1

## 2013-03-31 MED ORDER — MIDAZOLAM HCL 5 MG/5ML IJ SOLN
INTRAMUSCULAR | Status: AC
  Filled 2013-03-31: qty 10

## 2013-03-31 MED ORDER — SODIUM CHLORIDE 0.9 % IV SOLN
INTRAVENOUS | Status: DC
  Administered 2013-03-31: 1000 mL via INTRAVENOUS

## 2013-03-31 NOTE — Op Note (Signed)
COLONOSCOPY PROCEDURE REPORT  PATIENT:  Cindy [name]  MR#:  [id number] Birthdate:  [birth year], 57 y.o., female Endoscopist:  Dr. Malissa Hippo, MD Referred By:  Dr. Sterling Big.Jeanice Lim, MD  Procedure Date: 03/31/2013  Procedure:   Colonoscopy  Indications: Patient is 57 year old Caucasian female was undergoing average risk screening colonoscopy.  Informed Consent:  The procedure and risks were reviewed with the patient and informed consent was obtained.  Medications:  Demerol 50 mg IV Versed 5 mg IV  Description of procedure:  After a digital rectal exam was performed, that colonoscope was advanced from the anus through the rectum and colon to the area of the cecum, ileocecal valve and appendiceal orifice. The cecum was deeply intubated. These structures were well-seen and photographed for the record. From the level of the cecum and ileocecal valve, the scope was slowly and cautiously withdrawn. The mucosal surfaces were carefully surveyed utilizing scope tip to flexion to facilitate fold flattening as needed. The scope was pulled down into the rectum where a thorough exam including retroflexion was performed.  Findings:   Prep excellent two small diverticula noted at sigmoid colon. 3 mm polyp ablated via cold biopsy from mid sigmoid colon. Normal rectal mucosa and anal rectal junction.   Therapeutic/Diagnostic Maneuvers Performed:  See above  Complications:  None  Cecal Withdrawal Time:  9 minutes  Impression:  Examination performed to cecum. 3 mm polyp ablated via cold biopsy from sigmoid colon. Two small diverticula at sigmoid colon.  Recommendations:  Standard instructions given. I will contact patient with biopsy results and further recommendations.   Cindy [name]  03/31/2013 8:14 AM  CC: Dr. Milinda Antis, MD & Dr. Bonnetta Barry ref. provider found

## 2013-03-31 NOTE — H&P (Signed)
Cindy [name] is an 57 y.o. female.   Chief Complaint: Patient is here  for colonoscopy. HPI:  Patient is 57 year old Caucasian female with multiple medical problems including O2 dependent COPD who is here for screening exam. This patient's first exam. She denies abdominal pain change in her bowel habits or rectal bleeding. Family history is negative for CRC.  Past Medical History  Diagnosis Date  . COPD (chronic obstructive pulmonary disease)   . Shortness of breath   . Compression fracture of lumbar vertebra   . Hyperglycemia, drug-induced   . Hyperlipidemia   . Osteoporosis 2013  . Tobacco abuse   . Chronic respiratory failure On home 02 2-3L  . Thyroid disease     pt denies    Past Surgical History  Procedure Laterality Date  . Tubal ligation    . Cervical biopsy      Family History  Problem Relation Age of Onset  . Heart disease Mother   . Hypertension Sister   . Hypertension Brother    Social History:  reports that she has been smoking Cigarettes.  She has a 12.5 pack-year smoking history. She has never used smokeless tobacco. She reports that she does not drink alcohol or use illicit drugs.  Allergies:  Allergies  Allergen Reactions  . Doxycycline Other (See Comments)    Gave patient oral thrush  . Avelox (Moxifloxacin Hcl In Nacl) Itching and Rash    Medications Prior to Admission  Medication Sig Dispense Refill  . albuterol (PROVENTIL) (2.5 MG/3ML) 0.083% nebulizer solution Take 2.5 mg by nebulization every 6 (six) hours as needed for wheezing or shortness of breath.      Marland Kitchen alendronate (FOSAMAX) 70 MG tablet Take 70 mg by mouth every 7 (seven) days. Take with a full glass of water on an empty stomach.(wednesday)      . aspirin EC 81 MG tablet Take 81 mg by mouth every morning.       . budesonide-formoterol (SYMBICORT) 160-4.5 MCG/ACT inhaler Inhale 2 puffs into the lungs 2 (two) times daily.  1 Inhaler  11  . Calcium Carbonate-Vitamin D (CALCIUM 600/VITAMIN D)  600-400 MG-UNIT per tablet Take 1 tablet by mouth 2 (two) times daily.      . Multiple Vitamin (MULITIVITAMIN WITH MINERALS) TABS Take 1 tablet by mouth every morning.       Marland Kitchen oxyCODONE (OXY IR/ROXICODONE) 5 MG immediate release tablet Take 1 tablet (5 mg total) by mouth every 4 (four) hours as needed.  30 tablet  0  . pantoprazole (PROTONIX) 40 MG tablet Take 1 tablet (40 mg total) by mouth daily.  90 tablet  1  . predniSONE (DELTASONE) 20 MG tablet Take 2 tablets daily for 3 days, then 1 tablet daily for 3 days, then half tablet daily for 3 days, then STOP.  12 tablet  0  . simvastatin (ZOCOR) 20 MG tablet Take 1 tablet (20 mg total) by mouth every evening.  90 tablet  1  . tiotropium (SPIRIVA) 18 MCG inhalation capsule Place 18 mcg into inhaler and inhale every evening. inhale contents of 1 capsule by mouth once daily      . albuterol (PROVENTIL HFA;VENTOLIN HFA) 108 (90 BASE) MCG/ACT inhaler Inhale 2 puffs into the lungs every 4 (four) hours as needed for wheezing.  1 Inhaler  2  . azithromycin (ZITHROMAX) 500 MG tablet Take 1 tablet (500 mg total) by mouth daily.  6 each  0  . peg 3350 powder (MOVIPREP) 100 G  SOLR Take 1 kit (100 g total) by mouth once.  1 kit  0    No results found for this or any previous visit (from the past 48 hour(s)). No results found.  ROS  Blood pressure 110/84, pulse 90, temperature 98.1 F (36.7 C), temperature source Oral, resp. rate 17, height 5' 2.5" (1.588 m), weight 144 lb (65.318 kg), SpO2 97.00%. Physical Exam  Constitutional: She appears well-developed and well-nourished.  HENT:  Mouth/Throat: Oropharynx is clear and moist.  Eyes: Conjunctivae are normal. No scleral icterus.  Neck: No thyromegaly present.  Cardiovascular: Normal rate, regular rhythm and normal heart sounds.   No murmur heard. Respiratory:  Scattered expiratory rhonchi and diminished intensity of breath sounds bilaterally.  Lymphadenopathy:    She has no cervical adenopathy.      Assessment/Plan  average risk screening colonoscopy.  Simuel Stebner U 03/31/2013, 7:33 AM

## 2013-04-04 ENCOUNTER — Encounter (HOSPITAL_COMMUNITY): Payer: Self-pay | Admitting: Internal Medicine

## 2013-04-11 ENCOUNTER — Other Ambulatory Visit: Payer: Self-pay | Admitting: Family Medicine

## 2013-04-11 ENCOUNTER — Ambulatory Visit (INDEPENDENT_AMBULATORY_CARE_PROVIDER_SITE_OTHER): Payer: Medicaid Other | Admitting: Family Medicine

## 2013-04-11 ENCOUNTER — Encounter: Payer: Self-pay | Admitting: Family Medicine

## 2013-04-11 VITALS — BP 126/74 | HR 106 | Resp 18 | Ht 63.0 in | Wt 146.1 lb

## 2013-04-11 DIAGNOSIS — I998 Other disorder of circulatory system: Secondary | ICD-10-CM

## 2013-04-11 DIAGNOSIS — M25529 Pain in unspecified elbow: Secondary | ICD-10-CM

## 2013-04-11 DIAGNOSIS — J441 Chronic obstructive pulmonary disease with (acute) exacerbation: Secondary | ICD-10-CM

## 2013-04-11 DIAGNOSIS — N200 Calculus of kidney: Secondary | ICD-10-CM

## 2013-04-11 DIAGNOSIS — R58 Hemorrhage, not elsewhere classified: Secondary | ICD-10-CM

## 2013-04-11 DIAGNOSIS — M25522 Pain in left elbow: Secondary | ICD-10-CM

## 2013-04-11 DIAGNOSIS — J309 Allergic rhinitis, unspecified: Secondary | ICD-10-CM

## 2013-04-11 MED ORDER — PREDNISONE 10 MG PO TABS: ORAL_TABLET | ORAL | Status: DC

## 2013-04-11 MED ORDER — SODIUM CHLORIDE 0.9 % IV SOLN
125.0000 mg | Freq: Once | INTRAVENOUS | Status: AC
  Administered 2013-04-11: 130 mg via INTRAMUSCULAR

## 2013-04-11 MED ORDER — FLUTICASONE PROPIONATE 50 MCG/ACT NA SUSP: 2.0000 | Freq: Every day | NASAL | Status: DC

## 2013-04-11 NOTE — Assessment & Plan Note (Signed)
Multiple tiny stones on left side, will see urology

## 2013-04-11 NOTE — Progress Notes (Signed)
  Subjective:    Patient ID: Cindy [name], female    DOB: Nov 07, 1956, 57 y.o.   MRN: [id number]  HPI  Pt here with her typical wheeze, cough, SOB, started after allergies, sore throat and nasal congestion a few days ago. States her breathing is getting bad. Wearing oxygen most of day, continues to smoke. Reviewed MRI results with her,  Concerned about red spots that pop up on forearm take weeks to go away, no pain, no itching Left upper arm pain for past couple of weeks, no specific injury, tender in 1 spot mostly, no severe pain does not stop her activities  Review of Systems  GEN- + fatigue, fever, weight loss,weakness, recent illness HEENT- denies eye drainage, change in vision, +nasal discharge, CVS- denies chest pain, palpitations RESP- + SOB, +cough, +wheeze ABD- denies N/V, change in stools, abd pai Neuro- denies headache, dizziness, syncope, seizure activity MSK- +joint pain     Objective:   Physical Exam GEN- NAD, alert and oriented x3 HEENT- PERRL, EOMI, non injected sclera, pink conjunctiva, MMM, oropharynx clear, clear rhinorrhea, maxillary sinus NT, TM clear bilat Neck- Supple, no LAD CVS- Tachycardic HR 100, no murmur RESP-bilateral wheeze, fair air movement, mild Rhonchi clears some with cough EXT- No edema Pulses- Radial 2+ MSK- Bilateral shoulder/arm normal inspection, neg empty can, no impingement, Biceps in tact Left side, mild TTP over biceps- middle, normal ROM elbow Skin- mild ecchymosis right forearm- size of dime, no raised lesions,NT       Assessment & Plan:

## 2013-04-11 NOTE — Patient Instructions (Addendum)
Try taking 1/2 tablet of pain pill, if not we will change Start allergy medication- flonase  Get the labs done  Take the mucinex  Prednisone as directed tomorrow Muscle strain in arm You can take 1 sudafed a day for the next 2 days  F/U 3 month

## 2013-04-11 NOTE — Assessment & Plan Note (Signed)
Given mucniex DM from office Flonase for allergy symptoms Benadryl preferred by patient

## 2013-04-11 NOTE — Assessment & Plan Note (Signed)
Mild spontaneous ecchymotic lesions on forearms at times, I think this may be due to thin skin with prednisone Plts normal, check PT/INR, liver function normal

## 2013-04-11 NOTE — Assessment & Plan Note (Signed)
Set off by allergies this time, given prednisone She continues to decline, f/u pulmonary in a few weeks ? If low dose prednisone would be good her, however she does have benign appearing adrenal adenoma Oxygen dependent

## 2013-04-11 NOTE — Assessment & Plan Note (Signed)
likely muscle strain, no weakness noted,no atrophy

## 2013-04-12 LAB — PROTIME-INR: INR: 0.87 (ref <1.50)

## 2013-04-12 NOTE — Telephone Encounter (Signed)
Pt seen in office

## 2013-04-14 ENCOUNTER — Ambulatory Visit: Payer: Medicaid Other | Admitting: Family Medicine

## 2013-04-22 ENCOUNTER — Ambulatory Visit (INDEPENDENT_AMBULATORY_CARE_PROVIDER_SITE_OTHER): Payer: Medicaid Other | Admitting: Emergency Medicine

## 2013-04-22 ENCOUNTER — Encounter: Payer: Self-pay | Admitting: Emergency Medicine

## 2013-04-22 VITALS — BP 102/72 | HR 111 | Temp 97.8°F | Ht 63.0 in | Wt 148.8 lb

## 2013-04-22 DIAGNOSIS — J4489 Other specified chronic obstructive pulmonary disease: Secondary | ICD-10-CM

## 2013-04-22 DIAGNOSIS — J449 Chronic obstructive pulmonary disease, unspecified: Secondary | ICD-10-CM

## 2013-04-22 DIAGNOSIS — Z720 Tobacco use: Secondary | ICD-10-CM

## 2013-04-22 DIAGNOSIS — F17200 Nicotine dependence, unspecified, uncomplicated: Secondary | ICD-10-CM

## 2013-04-22 NOTE — Patient Instructions (Addendum)
Continue symbicort twice a day. Rinse your mouth after using Continue Spiriva Continue your oxygen as you are using it We MUST work on stopping smoking. You will not be able to manage your COPD unless we are able to do this.  Follow with Dr Delton Coombes in 1 month

## 2013-04-22 NOTE — Assessment & Plan Note (Addendum)
-   continue symbicort and spiriva - discussed smoking cessation, not ready to stop - freq exacerbations, discussed w her today that we are at an impasse until we can get her off cigarettes.   - did not tolerate daliresp - rov 1 month

## 2013-04-22 NOTE — Progress Notes (Signed)
Subjective:    Patient ID: Cindy [name], female    DOB: 01/14/1956, 57 y.o.   MRN: [id number]  HPI 57 yo woman, smoker (40pk-yrs), hyperlipidemia. Followed by Dr Jeanice Lim and on Spiriva + Symbicort + ProAir prn, FEV1 19% predicted on prior PFT! She has had several exacerbations over the last year. Was first noted to be hypoxemic in 2010, is on O2 that she is using prn.  She has exertional wheeze, SOB. Coughs daily - productive white, thick.   ROV 09/27/12 -- follows up COPD, hypoxemia, on Spiriva/Symbicort. 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 Since last visit has been by Dr Jeanice Lim and in the ED, treated with pred for chest tightness.  Uses albuterol every 6 hours. Hasn;t had a-1 AT yet to my knowledge.    PULMONARY FUNCTON TEST 09/27/2012  FVC 2.31  FEV1 .87  FEV1/FVC 37.7  FVC  % Predicted 76  FEV % Predicted 38  FeF 25-75 .3  FeF 25-75 % Predicted 2.65   ROV 12/01/12 -- COPD + hypoxemia, severe AFL. Returns for f/u. Continues to smoke 0.5-1.0 pk/day. About last 3 weeks, URI sx, nasal congestion, sore throat. Was treated by Dr Jeanice Lim with augmentin and prednisone. Remains on Spiriva + Symbicort.   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 Has been seen in the ED and by Dr Jeanice Lim several times since our last visit. She is finishing augmentin + pred now to treat recent AE + bronchitic sx. She continues to smoke > 1pk/day. Using SABA a few times a week. Improved cough, green sputum.   ROV 02/22/13 -- severe COPD, frequent AE's, hypoxemia. Active tobacco use. We started daliresp last time. She reports that she is doing OK, she was treated with abx/pred before our last visit 1/17. She reports constipation since last visit, ? From daliresp. She is smoking >1pk/day. She is asking for prednisone for her back pain, constipation.   ROV 04/22/13 -- Active smoker, Severe COPD, frequent AE's most recently in March (since our last appt), ? Due to  allergies. Finished pred, started fluticasone. She is dealing with pain from compression fracture. Remains on symbicort and spiriva. She has also recently been found to have L adrenal adenoma > to be seen by urology, [place].       Objective:   Physical Exam Filed Vitals:   04/22/13 1326  BP: 102/72  Pulse: 111  Temp: 97.8 F (36.6 C)   Gen: Pleasant, well-nourished, in no distress, very tangential, difficult history giver  ENT: No lesions,  mouth clear,  oropharynx clear, no postnasal drip  Neck: No JVD, no TMG, no carotid bruits, some mild UA exp noise  Lungs: No use of accessory muscles, B rhonchi, soft B exp wheezes.   Cardiovascular: RRR, heart sounds normal, no murmur or gallops, no peripheral edema  Musculoskeletal: No deformities, no cyanosis or clubbing  Neuro: alert, non focal  Skin: Warm, no lesions or rashes     Assessment & Plan:  Tobacco abuse Discussed again in detail today. She understands the importance of cessation. Wants to discuss again when she has delt with her compression fx and her L adenoma.   C O P D - continue symbicort and spiriva - discussed smoking cessation, not ready to stop - freq exacerbations, discussed w her today that we are at an impasse until we can get her off cigarettes.   - did not tolerate daliresp - rov 1 month

## 2013-04-22 NOTE — Assessment & Plan Note (Signed)
Discussed again in detail today. She understands the importance of cessation. Wants to discuss again when she has delt with her compression fx and her L adenoma.

## 2013-04-24 ENCOUNTER — Other Ambulatory Visit: Payer: Self-pay | Admitting: Family Medicine

## 2013-04-26 ENCOUNTER — Ambulatory Visit (INDEPENDENT_AMBULATORY_CARE_PROVIDER_SITE_OTHER): Payer: Medicaid Other | Admitting: Urology

## 2013-04-26 DIAGNOSIS — D3500 Benign neoplasm of unspecified adrenal gland: Secondary | ICD-10-CM

## 2013-04-26 DIAGNOSIS — N200 Calculus of kidney: Secondary | ICD-10-CM

## 2013-04-28 ENCOUNTER — Telehealth: Payer: Self-pay | Admitting: Family Medicine

## 2013-04-28 NOTE — Telephone Encounter (Signed)
Patient was informed the last time she walked in the office that she needs to make appointment if area worsens.

## 2013-05-02 ENCOUNTER — Encounter: Payer: Self-pay | Admitting: Family Medicine

## 2013-05-02 ENCOUNTER — Ambulatory Visit (INDEPENDENT_AMBULATORY_CARE_PROVIDER_SITE_OTHER): Payer: Medicaid Other | Admitting: Family Medicine

## 2013-05-02 VITALS — BP 126/76 | HR 98 | Resp 18 | Ht 63.0 in | Wt 147.1 lb

## 2013-05-02 DIAGNOSIS — L089 Local infection of the skin and subcutaneous tissue, unspecified: Secondary | ICD-10-CM

## 2013-05-02 DIAGNOSIS — L299 Pruritus, unspecified: Secondary | ICD-10-CM | POA: Insufficient documentation

## 2013-05-02 DIAGNOSIS — J449 Chronic obstructive pulmonary disease, unspecified: Secondary | ICD-10-CM

## 2013-05-02 MED ORDER — BUDESONIDE-FORMOTEROL FUMARATE 160-4.5 MCG/ACT IN AERO: 2.0000 | INHALATION_SPRAY | Freq: Two times a day (BID) | RESPIRATORY_TRACT | Status: DC

## 2013-05-02 MED ORDER — CEPHALEXIN 500 MG PO CAPS: 500.0000 mg | ORAL_CAPSULE | Freq: Two times a day (BID) | ORAL | Status: AC

## 2013-05-02 MED ORDER — HYDROXYZINE HCL 25 MG PO TABS: 25.0000 mg | ORAL_TABLET | Freq: Three times a day (TID) | ORAL | Status: DC | PRN

## 2013-05-02 MED ORDER — TIOTROPIUM BROMIDE MONOHYDRATE 18 MCG IN CAPS: 18.0000 ug | ORAL_CAPSULE | Freq: Every evening | RESPIRATORY_TRACT | Status: DC

## 2013-05-02 NOTE — Assessment & Plan Note (Signed)
Infected insect bite in the popliteal area on her on Keflex twice a day use warm compresses, I do not see any good area to drain today

## 2013-05-02 NOTE — Progress Notes (Signed)
  Subjective:    Patient ID: Cindy [name], female    DOB: 1956/12/28, 57 y.o.   MRN: [id number]  HPI  Patient presents with insect bite for the past 10 days. She noticed a red spot it is subsequently enlarged denies any tenderness. She said that she try to pop it with something and it drained a little bit of fluid. She was using an antibacterial topical on it which did not help. She also complains of itching. She is unable to tolerate oxycodone therefore has stopped. She's been congested some this past week.  Review of Systems   GEN- denies fatigue, fever, weight loss,weakness, recent illness HEENT- denies eye drainage, change in vision, nasal discharge, CVS- denies chest pain, palpitations RESP- denies SOB, +cough, +wheeze MSK-+ joint pain, muscle aches, injury Neuro- denies headache, dizziness, syncope, seizure activity      Objective:   Physical Exam   GEN- NAD, alert and oriented x3 CVS- RRR, no murmur RESP-few scattered wheeze, congested, normal WOB, sat 97% 2L EXT- No edema Skin- Left popliteal- 1.5cm area of erythema and induration, mild warmth, scabs in center (? Bite marks), NT , non fluctuant  Pulses- Radial 2+       Assessment & Plan:

## 2013-05-02 NOTE — Patient Instructions (Addendum)
Take the antibiotics Use warm compresses behind the leg three times day  Use some mucinex  Keep previous follow-up appointment

## 2013-05-02 NOTE — Assessment & Plan Note (Signed)
Her breathing is currently at her baseline. I will have her continue Mucinex this week for help with some of the congestion she will continue her current inhalers She is followup with pulmonary this month

## 2013-05-02 NOTE — Assessment & Plan Note (Signed)
This may be due to the oxycodone, or recent allergy exposures Will add vistaril anti-histamine prn

## 2013-05-06 ENCOUNTER — Encounter: Payer: Self-pay | Admitting: Family Medicine

## 2013-05-06 ENCOUNTER — Ambulatory Visit (INDEPENDENT_AMBULATORY_CARE_PROVIDER_SITE_OTHER): Payer: Medicaid Other | Admitting: Family Medicine

## 2013-05-06 VITALS — BP 122/72 | HR 88 | Resp 18 | Ht 63.0 in | Wt 147.0 lb

## 2013-05-06 DIAGNOSIS — Z888 Allergy status to other drugs, medicaments and biological substances status: Secondary | ICD-10-CM

## 2013-05-06 DIAGNOSIS — T7840XA Allergy, unspecified, initial encounter: Secondary | ICD-10-CM

## 2013-05-06 MED ORDER — METHYLPREDNISOLONE ACETATE 40 MG/ML IJ SUSP
40.0000 mg | Freq: Once | INTRAMUSCULAR | Status: AC
  Administered 2013-05-06: 40 mg via INTRAMUSCULAR

## 2013-05-06 NOTE — Patient Instructions (Addendum)
Given Depo Medrol given Stop the keflex and vistaril I think this is an allergy to the antibiotic Use light moisturizer and sunscreen Wear a hat Call if you do  Not improve You can take the bendryl  Use the antibiotic cream on your leg and cortisone F/U as previous

## 2013-05-08 DIAGNOSIS — T7840XA Allergy, unspecified, initial encounter: Secondary | ICD-10-CM | POA: Insufficient documentation

## 2013-05-08 NOTE — Assessment & Plan Note (Signed)
I think this was  An allergic reaction to Keflex, given depo medrol in office, to help with itching, face peeling has stopped, now dry and red Protect skin with sunscreen Benadryl can be continued as needed

## 2013-05-08 NOTE — Progress Notes (Signed)
  Subjective:    Patient ID: Cindy [name], female    DOB: December 24, 1956, 57 y.o.   MRN: [id number]  HPI  Pt here with redness and peeling to face, feels tight, noticed it 24 hours after taking keflex and vistaril, but continued for another 2 days on medication. No fever, had itchy throat now resolved   Review of Systems -per above  GEN- denies fatigue, fever, weight loss,weakness, recent illness HEENT- denies eye drainage, change in vision, nasal discharge, CVS- denies chest pain, palpitations RESP- denies SOB, cough, wheezey Neuro- denies headache, dizziness, syncope, seizure activity      Objective:   Physical Exam   GEN-NAD,alert and oriented x 3 HEENT- PERRL oropharynx clear, no oral lesions, EOMI, uvula midline, no exudates CVS-RRR, no murmur RESP- scattered wheeze, normal WOB Skin- erythema to face bilat , peeling dry skin around mouth, skin feels very rough- like sandpaper fill, NT to touch, no warmth  Mild puffiness beneath eyes     Assessment & Plan:

## 2013-05-12 ENCOUNTER — Telehealth: Payer: Self-pay

## 2013-05-12 MED ORDER — PROMETHAZINE HCL 12.5 MG PO TABS: 12.5000 mg | ORAL_TABLET | Freq: Four times a day (QID) | ORAL | Status: DC | PRN

## 2013-05-12 NOTE — Telephone Encounter (Signed)
C/o nausea and vomiting since 12. 7-8 episodes, some chills, Please advise

## 2013-05-12 NOTE — Telephone Encounter (Signed)
Patient aware.

## 2013-05-12 NOTE — Telephone Encounter (Signed)
Nausea vomiting since this AM, mild cramping, start phenergan, clear liquids, come in for visit this afternoon or AM if needed

## 2013-05-14 ENCOUNTER — Emergency Department (HOSPITAL_COMMUNITY): Payer: Medicaid Other

## 2013-05-14 ENCOUNTER — Encounter (HOSPITAL_COMMUNITY): Payer: Self-pay

## 2013-05-14 ENCOUNTER — Emergency Department (HOSPITAL_COMMUNITY)
Admission: EM | Admit: 2013-05-14 | Discharge: 2013-05-14 | Disposition: A | Discharge status: Home / Self Care | Payer: Medicaid Other | Attending: Emergency Medicine | Admitting: Emergency Medicine

## 2013-05-14 DIAGNOSIS — R5383 Other fatigue: Secondary | ICD-10-CM | POA: Insufficient documentation

## 2013-05-14 DIAGNOSIS — Z9889 Other specified postprocedural states: Secondary | ICD-10-CM | POA: Insufficient documentation

## 2013-05-14 DIAGNOSIS — R5381 Other malaise: Secondary | ICD-10-CM | POA: Insufficient documentation

## 2013-05-14 DIAGNOSIS — Z8781 Personal history of (healed) traumatic fracture: Secondary | ICD-10-CM | POA: Insufficient documentation

## 2013-05-14 DIAGNOSIS — Z7982 Long term (current) use of aspirin: Secondary | ICD-10-CM | POA: Insufficient documentation

## 2013-05-14 DIAGNOSIS — E785 Hyperlipidemia, unspecified: Secondary | ICD-10-CM | POA: Insufficient documentation

## 2013-05-14 DIAGNOSIS — Z3202 Encounter for pregnancy test, result negative: Secondary | ICD-10-CM | POA: Insufficient documentation

## 2013-05-14 DIAGNOSIS — Z9851 Tubal ligation status: Secondary | ICD-10-CM | POA: Insufficient documentation

## 2013-05-14 DIAGNOSIS — F17200 Nicotine dependence, unspecified, uncomplicated: Secondary | ICD-10-CM | POA: Insufficient documentation

## 2013-05-14 DIAGNOSIS — R6883 Chills (without fever): Secondary | ICD-10-CM | POA: Insufficient documentation

## 2013-05-14 DIAGNOSIS — N23 Unspecified renal colic: Secondary | ICD-10-CM | POA: Insufficient documentation

## 2013-05-14 DIAGNOSIS — R319 Hematuria, unspecified: Secondary | ICD-10-CM | POA: Insufficient documentation

## 2013-05-14 DIAGNOSIS — Z862 Personal history of diseases of the blood and blood-forming organs and certain disorders involving the immune mechanism: Secondary | ICD-10-CM | POA: Insufficient documentation

## 2013-05-14 DIAGNOSIS — R1110 Vomiting, unspecified: Secondary | ICD-10-CM | POA: Insufficient documentation

## 2013-05-14 DIAGNOSIS — N1330 Unspecified hydronephrosis: Secondary | ICD-10-CM | POA: Insufficient documentation

## 2013-05-14 DIAGNOSIS — Z8639 Personal history of other endocrine, nutritional and metabolic disease: Secondary | ICD-10-CM | POA: Insufficient documentation

## 2013-05-14 DIAGNOSIS — J449 Chronic obstructive pulmonary disease, unspecified: Secondary | ICD-10-CM | POA: Insufficient documentation

## 2013-05-14 DIAGNOSIS — IMO0002 Reserved for concepts with insufficient information to code with codable children: Secondary | ICD-10-CM | POA: Insufficient documentation

## 2013-05-14 DIAGNOSIS — N132 Hydronephrosis with renal and ureteral calculous obstruction: Secondary | ICD-10-CM

## 2013-05-14 DIAGNOSIS — N200 Calculus of kidney: Secondary | ICD-10-CM | POA: Insufficient documentation

## 2013-05-14 DIAGNOSIS — Z79899 Other long term (current) drug therapy: Secondary | ICD-10-CM | POA: Insufficient documentation

## 2013-05-14 DIAGNOSIS — E079 Disorder of thyroid, unspecified: Secondary | ICD-10-CM | POA: Insufficient documentation

## 2013-05-14 DIAGNOSIS — J4489 Other specified chronic obstructive pulmonary disease: Secondary | ICD-10-CM | POA: Insufficient documentation

## 2013-05-14 DIAGNOSIS — M810 Age-related osteoporosis without current pathological fracture: Secondary | ICD-10-CM | POA: Insufficient documentation

## 2013-05-14 DIAGNOSIS — Z8709 Personal history of other diseases of the respiratory system: Secondary | ICD-10-CM | POA: Insufficient documentation

## 2013-05-14 LAB — CBC WITH DIFFERENTIAL/PLATELET
Eosinophils Relative: 1 % (ref 0–5)
HCT: 43.1 % (ref 36.0–46.0)
Lymphocytes Relative: 12 % (ref 12–46)
Lymphs Abs: 2.2 10*3/uL (ref 0.7–4.0)
MCV: 93.5 fL (ref 78.0–100.0)
Monocytes Absolute: 1.3 10*3/uL — ABNORMAL HIGH (ref 0.1–1.0)
Neutro Abs: 14.3 10*3/uL — ABNORMAL HIGH (ref 1.7–7.7)
RBC: 4.61 MIL/uL (ref 3.87–5.11)
WBC: 17.9 10*3/uL — ABNORMAL HIGH (ref 4.0–10.5)

## 2013-05-14 LAB — URINALYSIS, ROUTINE W REFLEX MICROSCOPIC
Glucose, UA: NEGATIVE mg/dL
Ketones, ur: NEGATIVE mg/dL
Nitrite: NEGATIVE
Specific Gravity, Urine: 1.02 (ref 1.005–1.030)
pH: 7.5 (ref 5.0–8.0)

## 2013-05-14 LAB — BASIC METABOLIC PANEL
CO2: 32 mEq/L (ref 19–32)
Chloride: 99 mEq/L (ref 96–112)
GFR calc Af Amer: 84 mL/min — ABNORMAL LOW (ref >90)
Potassium: 3.7 mEq/L (ref 3.5–5.1)
Sodium: 141 mEq/L (ref 135–145)

## 2013-05-14 LAB — URINE MICROSCOPIC-ADD ON

## 2013-05-14 MED ORDER — SULFAMETHOXAZOLE-TMP DS 800-160 MG PO TABS
1.0000 | ORAL_TABLET | Freq: Once | ORAL | Status: AC
  Administered 2013-05-14: 1 via ORAL
  Filled 2013-05-14: qty 1

## 2013-05-14 MED ORDER — SULFAMETHOXAZOLE-TRIMETHOPRIM 800-160 MG PO TABS: 1.0000 | ORAL_TABLET | Freq: Two times a day (BID) | ORAL | Status: DC

## 2013-05-14 MED ORDER — HYDROMORPHONE HCL PF 1 MG/ML IJ SOLN
1.0000 mg | Freq: Once | INTRAMUSCULAR | Status: AC
  Administered 2013-05-14: 1 mg via INTRAVENOUS
  Filled 2013-05-14: qty 1

## 2013-05-14 MED ORDER — OXYCODONE-ACETAMINOPHEN 5-325 MG PO TABS: 1.0000 | ORAL_TABLET | ORAL | Status: DC | PRN

## 2013-05-14 NOTE — ED Notes (Signed)
Pt reports vomiting on Thursday, left flank pain, hematuria, no fever. H/o 9 stones that she knows of, is followed by dr. Carlyle Basques.

## 2013-05-14 NOTE — ED Provider Notes (Signed)
History     CSN: [id number]  Arrival date & time 05/14/13  1217   First MD Initiated Contact with Patient 05/14/13 1249      Chief Complaint  Patient presents with  . Flank Pain  . Nausea  . Hematuria     Patient is a 57 y.o. female presenting with flank pain. The history is provided by the patient.  Flank Pain This is a new problem. The current episode started 2 days ago. The problem occurs constantly. The problem has been gradually worsening. Pertinent negatives include no chest pain. Nothing aggravates the symptoms. Nothing relieves the symptoms. She has tried rest for the symptoms. The treatment provided no relief.    Past Medical History  Diagnosis Date  . COPD (chronic obstructive pulmonary disease)   . Shortness of breath   . Compression fracture of lumbar vertebra   . Hyperglycemia, drug-induced   . Hyperlipidemia   . Osteoporosis 2013  . Tobacco abuse   . Chronic respiratory failure On home 02 2-3L  . Thyroid disease     pt denies  . Adrenal adenoma 2014    Left  . Kidney stones 2014    Left side, multiple    Past Surgical History  Procedure Laterality Date  . Tubal ligation    . Cervical biopsy    . Colonoscopy N/A 03/31/2013    Procedure: COLONOSCOPY;  Surgeon: Malissa Hippo, MD;  Location: [facility];  Service: Endoscopy;  Laterality: N/A;  730    Family History  Problem Relation Age of Onset  . Heart disease Mother   . Hypertension Sister   . Hypertension Brother     History  Substance Use Topics  . Smoking status: Current Every Day Smoker -- 0.50 packs/day for 25 years    Types: Cigarettes  . Smokeless tobacco: Never Used  . Alcohol Use: No    OB History   Grav Para Term Preterm Abortions TAB SAB Ect Mult Living                  Review of Systems  Constitutional: Positive for chills.  Cardiovascular: Negative for chest pain.  Gastrointestinal: Positive for vomiting.  Genitourinary: Positive for hematuria and flank pain.   Neurological: Positive for weakness.  Psychiatric/Behavioral: Negative for agitation.  All other systems reviewed and are negative.    Allergies  Keflex; Doxycycline; and Avelox  Home Medications   Current Outpatient Rx  Name  Route  Sig  Dispense  Refill  . albuterol (PROVENTIL HFA;VENTOLIN HFA) 108 (90 BASE) MCG/ACT inhaler   Inhalation   Inhale 2 puffs into the lungs every 4 (four) hours as needed for wheezing.   1 Inhaler   2   . albuterol (PROVENTIL) (2.5 MG/3ML) 0.083% nebulizer solution   Nebulization   Take 2.5 mg by nebulization every 6 (six) hours as needed for wheezing or shortness of breath.         Marland Kitchen alendronate (FOSAMAX) 70 MG tablet   Oral   Take 70 mg by mouth every 7 (seven) days. Take with a full glass of water on an empty stomach.(wednesday)         . aspirin EC 81 MG tablet   Oral   Take 81 mg by mouth every morning.          . budesonide-formoterol (SYMBICORT) 160-4.5 MCG/ACT inhaler   Inhalation   Inhale 2 puffs into the lungs 2 (two) times daily.   1 Inhaler   11   .  Calcium Carbonate-Vitamin D (CALCIUM 600/VITAMIN D) 600-400 MG-UNIT per tablet   Oral   Take 1 tablet by mouth 2 (two) times daily.         . fluticasone (FLONASE) 50 MCG/ACT nasal spray   Nasal   Place 2 sprays into the nose daily.   16 g   3   . Multiple Vitamin (MULITIVITAMIN WITH MINERALS) TABS   Oral   Take 1 tablet by mouth every morning.          . pantoprazole (PROTONIX) 40 MG tablet   Oral   Take 1 tablet (40 mg total) by mouth daily.   90 tablet   1   . promethazine (PHENERGAN) 12.5 MG tablet   Oral   Take 1 tablet (12.5 mg total) by mouth every 6 (six) hours as needed for nausea.   30 tablet   0   . simvastatin (ZOCOR) 20 MG tablet      take 1 tablet by mouth every evening   90 tablet   1     Dispense 90 day supply   . tiotropium (SPIRIVA) 18 MCG inhalation capsule   Inhalation   Place 1 capsule (18 mcg total) into inhaler and inhale  every evening. inhale contents of 1 capsule by mouth once daily   30 capsule   11     BP 153/93  Pulse 97  Temp(Src) 97.6 F (36.4 C) (Oral)  Resp 20  Ht 5\' 4"  (1.626 m)  Wt 148 lb (67.132 kg)  BMI 25.39 kg/m2  SpO2 99%  Physical Exam CONSTITUTIONAL: Well developed/well nourished HEAD: Normocephalic/atraumatic EYES: EOMI/PERRL ENMT: Mucous membranes moist, pt wearing oxygen (chronic) NECK: supple no meningeal signs SPINE:entire spine nontender CV: S1/S2 noted, no murmurs/rubs/gallops noted LUNGS: coarse BS noted bilaterally, no apparent distress ABDOMEN: soft, nontender, no rebound or guarding ZO:XWRU cva tenderness NEURO: Pt is awake/alert, moves all extremitiesx4 EXTREMITIES: pulses normal, full ROM SKIN: warm, color normal PSYCH: no abnormalities of mood noted  ED Course  Procedures  Labs Reviewed  BASIC METABOLIC PANEL - Abnormal; Notable for the following:    Glucose, Bld 106 (*)    GFR calc non Af Amer 73 (*)    GFR calc Af Amer 84 (*)    All other components within normal limits  CBC WITH DIFFERENTIAL - Abnormal; Notable for the following:    WBC 17.9 (*)    Neutrophils Relative % 80 (*)    Neutro Abs 14.3 (*)    Monocytes Absolute 1.3 (*)    All other components within normal limits  URINALYSIS, ROUTINE W REFLEX MICROSCOPIC - Abnormal; Notable for the following:    APPearance CLOUDY (*)    Hgb urine dipstick LARGE (*)    Leukocytes, UA TRACE (*)    All other components within normal limits  URINE MICROSCOPIC-ADD ON - Abnormal; Notable for the following:    Squamous Epithelial / LPF FEW (*)    Bacteria, UA MANY (*)    All other components within normal limits  POCT PREGNANCY, URINE   1:51 PM Recent imaging reveal kidney stones (left) now with flank pain, hematuria and nausea/vomiting Pain meds ordered, will follow closely 3:43 PM Pt improved She reports mild nausea but pain improved Will start bactrim due to bacteria in her urine and elevated WBC  but no fever and not septic appearing Due to multiple drug allergies bactrim given and she tolerated this She is to call her urologist in 48 hours We discussed strict return precautions  including worsened pain, vomiting or fever above 100.15F  MDM  Nursing notes including past medical history and social history reviewed and considered in documentation Labs/vital reviewed and considered         Joya Gaskins, MD 05/14/13 1544

## 2013-05-15 LAB — URINE CULTURE

## 2013-05-17 ENCOUNTER — Encounter (HOSPITAL_COMMUNITY): Payer: Self-pay | Admitting: *Deleted

## 2013-05-17 ENCOUNTER — Emergency Department (HOSPITAL_COMMUNITY)
Admission: EM | Admit: 2013-05-17 | Discharge: 2013-05-17 | Disposition: A | Discharge status: Home / Self Care | Payer: Medicaid Other | Attending: Emergency Medicine | Admitting: Emergency Medicine

## 2013-05-17 ENCOUNTER — Telehealth: Payer: Self-pay | Admitting: Family Medicine

## 2013-05-17 DIAGNOSIS — N200 Calculus of kidney: Secondary | ICD-10-CM | POA: Insufficient documentation

## 2013-05-17 DIAGNOSIS — M810 Age-related osteoporosis without current pathological fracture: Secondary | ICD-10-CM | POA: Insufficient documentation

## 2013-05-17 DIAGNOSIS — Z8781 Personal history of (healed) traumatic fracture: Secondary | ICD-10-CM | POA: Insufficient documentation

## 2013-05-17 DIAGNOSIS — J4489 Other specified chronic obstructive pulmonary disease: Secondary | ICD-10-CM | POA: Insufficient documentation

## 2013-05-17 DIAGNOSIS — F17200 Nicotine dependence, unspecified, uncomplicated: Secondary | ICD-10-CM | POA: Insufficient documentation

## 2013-05-17 DIAGNOSIS — Z8709 Personal history of other diseases of the respiratory system: Secondary | ICD-10-CM | POA: Insufficient documentation

## 2013-05-17 DIAGNOSIS — E785 Hyperlipidemia, unspecified: Secondary | ICD-10-CM | POA: Insufficient documentation

## 2013-05-17 DIAGNOSIS — Z9851 Tubal ligation status: Secondary | ICD-10-CM | POA: Insufficient documentation

## 2013-05-17 DIAGNOSIS — Z8639 Personal history of other endocrine, nutritional and metabolic disease: Secondary | ICD-10-CM | POA: Insufficient documentation

## 2013-05-17 DIAGNOSIS — J449 Chronic obstructive pulmonary disease, unspecified: Secondary | ICD-10-CM | POA: Insufficient documentation

## 2013-05-17 DIAGNOSIS — Z7982 Long term (current) use of aspirin: Secondary | ICD-10-CM | POA: Insufficient documentation

## 2013-05-17 DIAGNOSIS — R110 Nausea: Secondary | ICD-10-CM | POA: Insufficient documentation

## 2013-05-17 DIAGNOSIS — Z79899 Other long term (current) drug therapy: Secondary | ICD-10-CM | POA: Insufficient documentation

## 2013-05-17 DIAGNOSIS — Z87442 Personal history of urinary calculi: Secondary | ICD-10-CM | POA: Insufficient documentation

## 2013-05-17 DIAGNOSIS — E079 Disorder of thyroid, unspecified: Secondary | ICD-10-CM | POA: Insufficient documentation

## 2013-05-17 DIAGNOSIS — Z862 Personal history of diseases of the blood and blood-forming organs and certain disorders involving the immune mechanism: Secondary | ICD-10-CM | POA: Insufficient documentation

## 2013-05-17 DIAGNOSIS — IMO0002 Reserved for concepts with insufficient information to code with codable children: Secondary | ICD-10-CM | POA: Insufficient documentation

## 2013-05-17 DIAGNOSIS — Z9889 Other specified postprocedural states: Secondary | ICD-10-CM | POA: Insufficient documentation

## 2013-05-17 MED ORDER — HYDROMORPHONE HCL PF 1 MG/ML IJ SOLN
1.0000 mg | Freq: Once | INTRAMUSCULAR | Status: DC
  Filled 2013-05-17: qty 1

## 2013-05-17 MED ORDER — HYDROMORPHONE HCL PF 1 MG/ML IJ SOLN
1.0000 mg | Freq: Once | INTRAMUSCULAR | Status: AC
  Administered 2013-05-17: 1 mg via INTRAVENOUS

## 2013-05-17 MED ORDER — ONDANSETRON 4 MG PO TBDP
4.0000 mg | ORAL_TABLET | Freq: Once | ORAL | Status: AC
  Administered 2013-05-17: 4 mg via ORAL
  Filled 2013-05-17: qty 1

## 2013-05-17 MED ORDER — KETOROLAC TROMETHAMINE 30 MG/ML IJ SOLN
60.0000 mg | Freq: Once | INTRAMUSCULAR | Status: AC
  Administered 2013-05-17: 60 mg via INTRAMUSCULAR
  Filled 2013-05-17 (×2): qty 1

## 2013-05-17 NOTE — Telephone Encounter (Signed)
Patient states that urology will see her 5/21

## 2013-05-17 NOTE — Telephone Encounter (Signed)
Please advise 

## 2013-05-17 NOTE — ED Notes (Signed)
Pt alert & oriented x4, stable gait. Patient given discharge instructions, paperwork & prescription(s). Patient  instructed to stop at the registration desk to finish any additional paperwork. Patient verbalized understanding. Pt left department w/ no further questions. 

## 2013-05-17 NOTE — Telephone Encounter (Signed)
Please call pt and see if the percocet or nausea medication is helping. You can call [facility] to see if nurse has received any information from Dr. Vernice Jefferson or if they have any other recommendations.   I can always refill pain and nausea medications

## 2013-05-17 NOTE — ED Notes (Signed)
Pt seen here & tx 2 days ago for kidney stones, pain returned tonight. CT states 4 mm stone.

## 2013-05-17 NOTE — ED Provider Notes (Signed)
History     CSN: [id number]  Arrival date & time 05/17/13  0125   First MD Initiated Contact with Patient 05/17/13 0209      Chief Complaint  Patient presents with  . Flank Pain    (Consider location/radiation/quality/duration/timing/severity/associated sxs/prior treatment) HPI HPI Comments: Cindy [name] is a 57 y.o. female who presents to the Emergency Department complaining of left sided abdominal pain from a 4 mm kidney stone diagnosed by CT on 05/14/13 when she was seen here. She received pain medicine and nausuea medicine which she has used. She contacted Dr. Rudean Haskell office yesterday and is waiting for a call back. Tonight she woke with increased pain to the left side that would not respond to her pain medicine.   PCP Dr. Jeanice Lim Past Medical History  Diagnosis Date  . COPD (chronic obstructive pulmonary disease)   . Shortness of breath   . Compression fracture of lumbar vertebra   . Hyperglycemia, drug-induced   . Hyperlipidemia   . Osteoporosis 2013  . Tobacco abuse   . Chronic respiratory failure On home 02 2-3L  . Thyroid disease     pt denies  . Adrenal adenoma 2014    Left  . Kidney stones 2014    Left side, multiple    Past Surgical History  Procedure Laterality Date  . Tubal ligation    . Cervical biopsy    . Colonoscopy N/A 03/31/2013    Procedure: COLONOSCOPY;  Surgeon: Malissa Hippo, MD;  Location: [facility];  Service: Endoscopy;  Laterality: N/A;  730    Family History  Problem Relation Age of Onset  . Heart disease Mother   . Hypertension Sister   . Hypertension Brother     History  Substance Use Topics  . Smoking status: Current Every Day Smoker -- 0.50 packs/day for 25 years    Types: Cigarettes  . Smokeless tobacco: Never Used  . Alcohol Use: No    OB History   Grav Para Term Preterm Abortions TAB SAB Ect Mult Living                  Review of Systems  Constitutional: Negative for fever.       10 Systems reviewed and are  negative for acute change except as noted in the HPI.  HENT: Negative for congestion.   Eyes: Negative for discharge and redness.  Respiratory: Negative for cough and shortness of breath.   Cardiovascular: Negative for chest pain.  Gastrointestinal: Positive for abdominal pain. Negative for vomiting.  Musculoskeletal: Negative for back pain.  Skin: Negative for rash.  Neurological: Negative for syncope, numbness and headaches.  Psychiatric/Behavioral:       No behavior change.    Allergies  Keflex; Doxycycline; and Avelox  Home Medications   Current Outpatient Rx  Name  Route  Sig  Dispense  Refill  . albuterol (PROVENTIL HFA;VENTOLIN HFA) 108 (90 BASE) MCG/ACT inhaler   Inhalation   Inhale 2 puffs into the lungs every 4 (four) hours as needed for wheezing.   1 Inhaler   2   . albuterol (PROVENTIL) (2.5 MG/3ML) 0.083% nebulizer solution   Nebulization   Take 2.5 mg by nebulization every 6 (six) hours as needed for wheezing or shortness of breath.         Marland Kitchen alendronate (FOSAMAX) 70 MG tablet   Oral   Take 70 mg by mouth every 7 (seven) days. Take with a full glass of water on an empty  stomach.(wednesday)         . aspirin EC 81 MG tablet   Oral   Take 81 mg by mouth every morning.          . budesonide-formoterol (SYMBICORT) 160-4.5 MCG/ACT inhaler   Inhalation   Inhale 2 puffs into the lungs 2 (two) times daily.   1 Inhaler   11   . Calcium Carbonate-Vitamin D (CALCIUM 600/VITAMIN D) 600-400 MG-UNIT per tablet   Oral   Take 1 tablet by mouth 2 (two) times daily.         . fluticasone (FLONASE) 50 MCG/ACT nasal spray   Nasal   Place 2 sprays into the nose daily.   16 g   3   . Multiple Vitamin (MULITIVITAMIN WITH MINERALS) TABS   Oral   Take 1 tablet by mouth every morning.          Marland Kitchen oxyCODONE-acetaminophen (PERCOCET/ROXICET) 5-325 MG per tablet   Oral   Take 1 tablet by mouth every 4 (four) hours as needed for pain.   15 tablet   0   .  pantoprazole (PROTONIX) 40 MG tablet   Oral   Take 1 tablet (40 mg total) by mouth daily.   90 tablet   1   . promethazine (PHENERGAN) 12.5 MG tablet   Oral   Take 1 tablet (12.5 mg total) by mouth every 6 (six) hours as needed for nausea.   30 tablet   0   . simvastatin (ZOCOR) 20 MG tablet      take 1 tablet by mouth every evening   90 tablet   1     Dispense 90 day supply   . sulfamethoxazole-trimethoprim (SEPTRA DS) 800-160 MG per tablet   Oral   Take 1 tablet by mouth every 12 (twelve) hours.   14 tablet   0   . tiotropium (SPIRIVA) 18 MCG inhalation capsule   Inhalation   Place 1 capsule (18 mcg total) into inhaler and inhale every evening. inhale contents of 1 capsule by mouth once daily   30 capsule   11     BP 177/91  Pulse 102  Temp(Src) 98.1 F (36.7 C) (Oral)  Resp 20  Ht 5' 2.5" (1.588 m)  Wt 148 lb (67.132 kg)  BMI 26.62 kg/m2  SpO2 96%  Physical Exam  Nursing note and vitals reviewed. Constitutional: She appears well-developed and well-nourished.  Awake, alert, nontoxic appearance.  HENT:  Head: Normocephalic and atraumatic.  Eyes: EOM are normal. Pupils are equal, round, and reactive to light.  Neck: Normal range of motion. Neck supple.  Cardiovascular: Normal rate and intact distal pulses.   Pulmonary/Chest: Effort normal and breath sounds normal. She exhibits no tenderness.  Abdominal: Soft. Bowel sounds are normal. There is no tenderness. There is no rebound.  Left sided abdominal pain with palpation  Musculoskeletal: She exhibits no tenderness.  Baseline ROM, no obvious new focal weakness.  Neurological:  Mental status and motor strength appears baseline for patient and situation.  Skin: No rash noted.  Psychiatric: She has a normal mood and affect.    ED Course  Procedures (including critical care time)    [phone] Patient is feeling better. Pain is relieved with dilaudid, toradol and zofran.  MDM  Patient diagnosed with kidney  stone 02/14/13 here with renewed pain. Given dilaudid, toradol and zofran with relief. Pt stable in ED with no significant deterioration in condition.The patient appears reasonably screened and/or stabilized for discharge and I  doubt any other medical condition or other [facility] requiring further screening, evaluation, or treatment in the ED at this time prior to discharge.  MDM Reviewed: nursing note, vitals and previous chart Reviewed previous: labs and CT scan           Nicoletta Dress. Colon Branch, MD 05/17/13 [phone]

## 2013-05-19 ENCOUNTER — Ambulatory Visit: Payer: Medicaid Other | Admitting: Emergency Medicine

## 2013-05-20 ENCOUNTER — Encounter: Payer: Self-pay | Admitting: Family Medicine

## 2013-05-20 ENCOUNTER — Ambulatory Visit (INDEPENDENT_AMBULATORY_CARE_PROVIDER_SITE_OTHER): Payer: Medicaid Other | Admitting: Family Medicine

## 2013-05-20 VITALS — BP 120/88 | HR 78 | Resp 16 | Wt 145.0 lb

## 2013-05-20 DIAGNOSIS — Z888 Allergy status to other drugs, medicaments and biological substances status: Secondary | ICD-10-CM

## 2013-05-20 DIAGNOSIS — T7840XA Allergy, unspecified, initial encounter: Secondary | ICD-10-CM

## 2013-05-20 DIAGNOSIS — J4489 Other specified chronic obstructive pulmonary disease: Secondary | ICD-10-CM

## 2013-05-20 DIAGNOSIS — J449 Chronic obstructive pulmonary disease, unspecified: Secondary | ICD-10-CM

## 2013-05-20 DIAGNOSIS — N200 Calculus of kidney: Secondary | ICD-10-CM

## 2013-05-20 MED ORDER — PREDNISONE 10 MG PO TABS: ORAL_TABLET | ORAL | Status: DC

## 2013-05-20 NOTE — Assessment & Plan Note (Signed)
She appears to have another allergic drug reaction however right now and is very difficult to tell which medication is causing the problem. It is likely  This started with the sulfa drugs as I am doubtful that this came from the flomax or the fluconazole.  For now I will have her stop her other oral medications she will continue her COPD medications and she will be given another dose of Depo-Medrol and a short course of prednisone which will help both the rash as well as her breathing at this time. I will have her restart the Flomax only on Monday we see if she has any allergic reaction . I would then go ahead to proceed to restart her other long-term medications one at a time to see if there is any reaction

## 2013-05-20 NOTE — Patient Instructions (Signed)
Prednisone as directed  For now take only your inhalers  On Monday night restart the flomax for your kidney stones F/U July as previous

## 2013-05-20 NOTE — Assessment & Plan Note (Signed)
Overall at baseline, some increased wheeze and congestion, she will have prednisone due to rash which will help

## 2013-05-20 NOTE — Progress Notes (Signed)
  Subjective:    Patient ID: Cindy [name], female    DOB: [birth year], 57 y.o.   MRN: [id number]  HPI  Patient here to followup ER visit as well as for rash. She was seen in the ER on May 17 in may 20 secondary to ureteral colic and hematuria. She was evaluated by her urologist earlier this week she was started on Flomax. She's concerned about rash on her neck in her chest. She was seen on May 9 after being on Keflex which I think was the culprit for her previous rash is actually cleared up before she was seen in the emergency room on May 17. At the May 17 visit she was prescribed Bactrim which she then started noticing some redness on her chest but this was discontinued by her urologist. She also has bruising and redness on her buttocks where she received a Toradol injection on May 20. She was then prescribed fluconazole for thrush for 3 days and started on table was sent by her urologist she took 3 days of both medications and then noticed peeling on her face and worsening of the redness on her chest therefore she thought she was allergic to either one of these medications. Of note her urologist did tell her to stop her simvastatin do to her use of the fluconazole.    Review of Systems  GEN- denies fatigue, fever, weight loss,weakness, recent illness HEENT- denies eye drainage, change in vision, nasal discharge, CVS- denies chest pain, palpitations RESP- denies SOB, +cough,+ wheeze ABD- denies N/V, change in stools, abd pain Neuro- denies headache, dizziness, syncope, seizure activity      Objective:   Physical Exam  GEN-NAD,alert and oriented x 3 HEENT- PERRL oropharynx clear, no oral lesions, EOMI, uvula midline,  CVS-RRR, no murmur RESP- scattered wheeze, normal WOB,course BS clear with cough Skin- mild peeling skin on chin and neck, erythema on chest, with few spots of peeling, few erythematous maculopapular lesions on bilateral arms, right buttocks 3cm around of bruising, mild  erythema      Assessment & Plan:

## 2013-05-20 NOTE — Assessment & Plan Note (Signed)
Reviewed her labs from urology with her She is to have repeat KUB in 2 weeks, if they only need the xray will obtain here in town

## 2013-05-27 ENCOUNTER — Telehealth: Payer: Self-pay | Admitting: Family Medicine

## 2013-05-27 NOTE — Telephone Encounter (Signed)
Advise her not to take the flomax, give [facility] a call and tell them she had reaction to it, to see if there is anything else, for now push fluids, do not get dehydrated  If she has pain while trying to urinate a stone could be trying to pass

## 2013-05-29 ENCOUNTER — Observation Stay (HOSPITAL_COMMUNITY)
Admission: EM | Admit: 2013-05-29 | Discharge: 2013-05-30 | Disposition: A | Discharge status: Home / Self Care | Payer: Medicaid Other | Attending: Internal Medicine | Admitting: Internal Medicine

## 2013-05-29 ENCOUNTER — Encounter (HOSPITAL_COMMUNITY): Payer: Self-pay | Admitting: *Deleted

## 2013-05-29 ENCOUNTER — Emergency Department (HOSPITAL_COMMUNITY): Payer: Medicaid Other

## 2013-05-29 DIAGNOSIS — R58 Hemorrhage, not elsewhere classified: Secondary | ICD-10-CM

## 2013-05-29 DIAGNOSIS — M545 Low back pain, unspecified: Secondary | ICD-10-CM

## 2013-05-29 DIAGNOSIS — R739 Hyperglycemia, unspecified: Secondary | ICD-10-CM

## 2013-05-29 DIAGNOSIS — D72829 Elevated white blood cell count, unspecified: Secondary | ICD-10-CM

## 2013-05-29 DIAGNOSIS — L299 Pruritus, unspecified: Secondary | ICD-10-CM

## 2013-05-29 DIAGNOSIS — Q891 Congenital malformations of adrenal gland: Secondary | ICD-10-CM

## 2013-05-29 DIAGNOSIS — R0902 Hypoxemia: Secondary | ICD-10-CM

## 2013-05-29 DIAGNOSIS — T50905A Adverse effect of unspecified drugs, medicaments and biological substances, initial encounter: Secondary | ICD-10-CM

## 2013-05-29 DIAGNOSIS — K5900 Constipation, unspecified: Secondary | ICD-10-CM | POA: Diagnosis present

## 2013-05-29 DIAGNOSIS — N132 Hydronephrosis with renal and ureteral calculous obstruction: Secondary | ICD-10-CM

## 2013-05-29 DIAGNOSIS — J40 Bronchitis, not specified as acute or chronic: Secondary | ICD-10-CM

## 2013-05-29 DIAGNOSIS — N200 Calculus of kidney: Secondary | ICD-10-CM | POA: Diagnosis present

## 2013-05-29 DIAGNOSIS — J449 Chronic obstructive pulmonary disease, unspecified: Secondary | ICD-10-CM

## 2013-05-29 DIAGNOSIS — Z720 Tobacco use: Secondary | ICD-10-CM | POA: Diagnosis present

## 2013-05-29 DIAGNOSIS — J441 Chronic obstructive pulmonary disease with (acute) exacerbation: Principal | ICD-10-CM | POA: Insufficient documentation

## 2013-05-29 DIAGNOSIS — E782 Mixed hyperlipidemia: Secondary | ICD-10-CM | POA: Diagnosis present

## 2013-05-29 DIAGNOSIS — N201 Calculus of ureter: Secondary | ICD-10-CM

## 2013-05-29 DIAGNOSIS — J309 Allergic rhinitis, unspecified: Secondary | ICD-10-CM

## 2013-05-29 DIAGNOSIS — J9620 Acute and chronic respiratory failure, unspecified whether with hypoxia or hypercapnia: Secondary | ICD-10-CM

## 2013-05-29 DIAGNOSIS — E785 Hyperlipidemia, unspecified: Secondary | ICD-10-CM | POA: Insufficient documentation

## 2013-05-29 DIAGNOSIS — R0609 Other forms of dyspnea: Secondary | ICD-10-CM | POA: Insufficient documentation

## 2013-05-29 DIAGNOSIS — R635 Abnormal weight gain: Secondary | ICD-10-CM

## 2013-05-29 DIAGNOSIS — K219 Gastro-esophageal reflux disease without esophagitis: Secondary | ICD-10-CM

## 2013-05-29 DIAGNOSIS — N390 Urinary tract infection, site not specified: Secondary | ICD-10-CM

## 2013-05-29 DIAGNOSIS — R509 Fever, unspecified: Secondary | ICD-10-CM

## 2013-05-29 DIAGNOSIS — T7840XA Allergy, unspecified, initial encounter: Secondary | ICD-10-CM

## 2013-05-29 DIAGNOSIS — F17200 Nicotine dependence, unspecified, uncomplicated: Secondary | ICD-10-CM | POA: Insufficient documentation

## 2013-05-29 DIAGNOSIS — M810 Age-related osteoporosis without current pathological fracture: Secondary | ICD-10-CM

## 2013-05-29 DIAGNOSIS — R0989 Other specified symptoms and signs involving the circulatory and respiratory systems: Secondary | ICD-10-CM | POA: Insufficient documentation

## 2013-05-29 LAB — CBC WITH DIFFERENTIAL/PLATELET
Basophils Relative: 0 % (ref 0–1)
HCT: 41 % (ref 36.0–46.0)
Hemoglobin: 13.7 g/dL (ref 12.0–15.0)
Lymphocytes Relative: 16 % (ref 12–46)
Lymphs Abs: 4.3 10*3/uL — ABNORMAL HIGH (ref 0.7–4.0)
MCHC: 33.4 g/dL (ref 30.0–36.0)
Monocytes Absolute: 1.8 10*3/uL — ABNORMAL HIGH (ref 0.1–1.0)
Monocytes Relative: 7 % (ref 3–12)
Neutro Abs: 21.1 10*3/uL — ABNORMAL HIGH (ref 1.7–7.7)
Neutrophils Relative %: 77 % (ref 43–77)
RBC: 4.38 MIL/uL (ref 3.87–5.11)
WBC: 27.5 10*3/uL — ABNORMAL HIGH (ref 4.0–10.5)

## 2013-05-29 LAB — URINALYSIS, ROUTINE W REFLEX MICROSCOPIC
Bilirubin Urine: NEGATIVE
Ketones, ur: NEGATIVE mg/dL
Leukocytes, UA: NEGATIVE
Nitrite: NEGATIVE
Specific Gravity, Urine: 1.015 (ref 1.005–1.030)
Urobilinogen, UA: 0.2 mg/dL (ref 0.0–1.0)

## 2013-05-29 LAB — URINE MICROSCOPIC-ADD ON

## 2013-05-29 LAB — COMPREHENSIVE METABOLIC PANEL
ALT: 18 U/L (ref 0–35)
Alkaline Phosphatase: 64 U/L (ref 39–117)
BUN: 10 mg/dL (ref 6–23)
CO2: 31 mEq/L (ref 19–32)
Chloride: 96 mEq/L (ref 96–112)
GFR calc Af Amer: >90 mL/min (ref >90)
Glucose, Bld: 120 mg/dL — ABNORMAL HIGH (ref 70–99)
Potassium: 3.9 mEq/L (ref 3.5–5.1)
Sodium: 134 mEq/L — ABNORMAL LOW (ref 135–145)
Total Bilirubin: 0.2 mg/dL — ABNORMAL LOW (ref 0.3–1.2)
Total Protein: 7 g/dL (ref 6.0–8.3)

## 2013-05-29 MED ORDER — AZTREONAM 1 G IJ SOLR
1.0000 g | Freq: Three times a day (TID) | INTRAMUSCULAR | Status: DC
  Administered 2013-05-30 (×2): 1 g via INTRAVENOUS
  Filled 2013-05-29 (×6): qty 1

## 2013-05-29 MED ORDER — ALBUTEROL SULFATE (5 MG/ML) 0.5% IN NEBU
5.0000 mg | INHALATION_SOLUTION | Freq: Once | RESPIRATORY_TRACT | Status: AC
  Administered 2013-05-29: 5 mg via RESPIRATORY_TRACT
  Filled 2013-05-29: qty 1

## 2013-05-29 MED ORDER — IPRATROPIUM BROMIDE 0.02 % IN SOLN
0.5000 mg | Freq: Once | RESPIRATORY_TRACT | Status: AC
  Administered 2013-05-29: 0.5 mg via RESPIRATORY_TRACT
  Filled 2013-05-29: qty 2.5

## 2013-05-29 MED ORDER — ALBUTEROL SULFATE (5 MG/ML) 0.5% IN NEBU: 2.5000 mg | INHALATION_SOLUTION | Freq: Once | RESPIRATORY_TRACT | Status: DC

## 2013-05-29 MED ORDER — SODIUM CHLORIDE 0.9 % IV SOLN
INTRAVENOUS | Status: DC
  Administered 2013-05-29: 21:00:00 via INTRAVENOUS

## 2013-05-29 NOTE — ED Notes (Signed)
Pt c/o SOB and fever. Pt has hx of COPD and states she is "always SOB". Pt states symptoms have worsened over past couple days. Pt has been using home nebulizer and inhaler without relief.

## 2013-05-29 NOTE — ED Notes (Signed)
Pt c/o sob and fever. Just finished a round of prednisone on Wednesday.

## 2013-05-29 NOTE — H&P (Signed)
[facility] History and Physical  Cindy [name]  ZOX:[id number]  DOB: 08-Jun-1956   DOA: 05/29/2013   PCP:   Milinda Antis, MD   Chief Complaint:  Dyspnea with exertion  HPI: Cindy [name] is an 57 y.o. female.   Middle-aged Caucasian lady with a history of COPD, nephrolithiasis and a recent diagnosis, weeks ago, of left UPJ stone with associated urinary tract infection, just completed a course of prednisone 4 days ago for COPD exacerbation, presents to the emergency room sudden onset of, "feeling sick" " looked into the mirror on my face looked very white" " worse when I am moving around"  In the emergency room, patient reportedly had multiple complaints with difficulty focusing, but she was concerned about her kidney stones, and bladder infection, I'm concerned about her COPD.  In addition the patient has been tried on multiple antibiotics for urinary tract infection as a complicated allergy history.  She was noted to be febrile and had a markedly elevated white count which was thought to be still related, but was otherwise looking well. She was thought to be a patient of Dr. Felecia Shelling, and the hospitalist service was asked to admit her initiate treatment for urinary tract infection and COPD, so Dr. Felecia Shelling could continue her treatment in hospital.  She has been started on aztreonam for urinary tract infection.  On evaluation on the ward, patient is very anxious, does not appear to be in significant respiratory distress, and multiple complaints. She gets very irritable when asked about symptoms that she does not possess.  She she admits to ongoing tobacco use.    Rewiew of Systems:   All systems negative except as marked bold or noted in the HPI;  Constitutional:    malaise, fever and chills. ;  Eyes:   eye pain, redness and discharge. ;  ENMT:   ear pain, hoarseness, nasal congestion, sinus pressure and sore throat. ;  Cardiovascular:    chest pain, palpitations, diaphoresis,  dyspnea and peripheral edema.  Respiratory:   cough, hemoptysis, wheezing and stridor. ;  Gastrointestinal:  nausea, vomiting, diarrhea, constipation, abdominal pain, melena, blood in stool, hematemesis, jaundice and rectal bleeding. unusual weight loss..   Genitourinary:    frequency, dysuria, incontinence,flank pain and hematuria; Musculoskeletal:   back pain and neck pain.  swelling and trauma.;  Skin: .  pruritus, rash, abrasions, bruising and skin lesion.; ulcerations Neuro:    headache, lightheadedness and neck stiffness.  weakness, altered level of consciousness, altered mental status, extremity weakness, burning feet, involuntary movement, seizure and syncope.  Psych:    anxiety, depression, insomnia, tearfulness, panic attacks, hallucinations, paranoia, suicidal or homicidal ideation    Past Medical History  Diagnosis Date  . COPD (chronic obstructive pulmonary disease)   . Shortness of breath   . Compression fracture of lumbar vertebra   . Hyperglycemia, drug-induced   . Hyperlipidemia   . Osteoporosis 2013  . Tobacco abuse   . Chronic respiratory failure On home 02 2-3L  . Thyroid disease     pt denies  . Adrenal adenoma 2014    Left  . Kidney stones 2014    Left side, multiple    Past Surgical History  Procedure Laterality Date  . Tubal ligation    . Cervical biopsy    . Colonoscopy N/A 03/31/2013    Procedure: COLONOSCOPY;  Surgeon: Malissa Hippo, MD;  Location: [facility];  Service: Endoscopy;  Laterality: N/A;  730    Medications:  HOME  MEDS: Prior to Admission medications   Medication Sig Start Date End Date Taking? Authorizing Provider  albuterol (PROVENTIL HFA;VENTOLIN HFA) 108 (90 BASE) MCG/ACT inhaler Inhale 2 puffs into the lungs every 4 (four) hours as needed for wheezing. 07/28/12 07/28/13 Yes [name], MD  albuterol (PROVENTIL) (2.5 MG/3ML) 0.083% nebulizer solution Take 2.5 mg by nebulization every 6 (six) hours as needed for wheezing or  shortness of breath. 01/10/13 01/10/14 Yes [name], MD  alendronate (FOSAMAX) 70 MG tablet Take 70 mg by mouth every 7 (seven) days. Take with a full glass of water on an empty stomach.Lulu Riding) 03/03/13 03/03/14 Yes [name], MD  aspirin EC 81 MG tablet Take 81 mg by mouth every morning.    Yes Historical Provider, MD  budesonide-formoterol (SYMBICORT) 160-4.5 MCG/ACT inhaler Inhale 2 puffs into the lungs 2 (two) times daily. 05/02/13 05/02/14 Yes [name], MD  Calcium Carbonate-Vitamin D (CALCIUM 600/VITAMIN D) 600-400 MG-UNIT per tablet Take 1 tablet by mouth 2 (two) times daily.   Yes Historical Provider, MD  fluticasone (FLONASE) 50 MCG/ACT nasal spray Place 2 sprays into the nose daily. 04/11/13 04/11/14 Yes [name], MD  Multiple Vitamin (MULITIVITAMIN WITH MINERALS) TABS Take 1 tablet by mouth every morning.    Yes Historical Provider, MD  oxyCODONE-acetaminophen (PERCOCET/ROXICET) 5-325 MG per tablet Take 1 tablet by mouth every 4 (four) hours as needed for pain. 05/14/13  Yes Joya Gaskins, MD  pantoprazole (PROTONIX) 40 MG tablet Take 1 tablet (40 mg total) by mouth daily. 01/10/13  Yes [name], MD  promethazine (PHENERGAN) 12.5 MG tablet Take 1 tablet (12.5 mg total) by mouth every 6 (six) hours as needed for nausea. 05/12/13  Yes [name], MD  simvastatin (ZOCOR) 20 MG tablet take 1 tablet by mouth every evening 04/24/13  Yes [name], MD  tiotropium (SPIRIVA) 18 MCG inhalation capsule Place 1 capsule (18 mcg total) into inhaler and inhale every evening. inhale contents of 1 capsule by mouth once daily 05/02/13  Yes [name], MD  predniSONE (DELTASONE) 10 MG tablet Take 40mg  by mouth 5 days 05/20/13   [name], MD     Allergies:  Allergies  Allergen Reactions  . Keflex (Cephalexin) Anaphylaxis and Rash  . Doxycycline Other (See Comments)    Gave patient oral thrush  . Sulfa Antibiotics   . Avelox (Moxifloxacin Hcl In  Nacl) Itching and Rash  . Toradol (Ketorolac Tromethamine) Swelling, Rash and Other (See Comments)    Bruising, swelling, rash at injection site    Social History:   reports that she has been smoking Cigarettes.  She has a 12.5 pack-year smoking history. She has never used smokeless tobacco. She reports that she does not drink alcohol or use illicit drugs.  Family History: Family History  Problem Relation Age of Onset  . Heart disease Mother   . Hypertension Sister   . Hypertension Brother      Physical Exam: Filed Vitals:   05/29/13 1949 05/29/13 2013 05/29/13 2237 05/29/13 2300  BP: 121/90  128/79   Pulse: 130  117 116  Temp: 100.4 F (38 C)  100.1 F (37.8 C)   TempSrc: Oral  Oral   Resp: 36  20 20  Height: 5' 2.5" (1.588 m)     Weight: 65.772 kg (145 lb)     SpO2: 97% 97% 100% 96%   Blood pressure 128/79, pulse 116, temperature 100.1 F (37.8 C), temperature  source Oral, resp. rate 20, height 5' 2.5" (1.588 m), weight 65.772 kg (145 lb), SpO2 96.00%.  GEN:  Pleasantbut somewhat irritable milliequivalents Caucasian lady  lying bed in no acute distress; cooperative with exam PSYCH:  alert and oriented x4; somewhat anxious; affect is appropriate. HEENT: Mucous membranes pink and anicteric; PERRLA; EOM intact; no cervical lymphadenopathy nor thyromegaly or carotid bruit; no JVD; Breasts:: Not examined CHEST WALL: No tenderness CHEST: Normal respiration,  occasional bilateral rhonchi  HEART: Regular rate and rhythm; no murmurs rubs or gallops BACK: No kyphosis no scoliosis; no CVA tenderness ABDOMEN: Obese, soft non-tender; no masses, no organomegaly, normal abdominal bowel sounds; no intertriginous candida. Rectal Exam: Not done EXTREMITIES: ; age-appropriate arthropathy of the hands and knees; no edema; no ulcerations. Genitalia: not examined PULSES: 2+ and symmetric SKIN: Normal hydration no rash or ulceration CNS: Cranial nerves 2-12 grossly intact no focal  lateralizing neurologic deficit   Labs on Admission:  Basic Metabolic Panel:  Recent Labs Lab 05/29/13 2057  NA 134*  K 3.9  CL 96  CO2 31  GLUCOSE 120*  BUN 10  CREATININE 0.74  CALCIUM 10.2   Liver Function Tests:  Recent Labs Lab 05/29/13 2057  AST 16  ALT 18  ALKPHOS 64  BILITOT 0.2*  PROT 7.0  ALBUMIN 3.7   No results found for this basename: LIPASE, AMYLASE,  in the last 168 hours No results found for this basename: AMMONIA,  in the last 168 hours CBC:  Recent Labs Lab 05/29/13 2057  WBC 27.5*  NEUTROABS 21.1*  HGB 13.7  HCT 41.0  MCV 93.6  PLT 260   Cardiac Enzymes: No results found for this basename: CKTOTAL, CKMB, CKMBINDEX, TROPONINI,  in the last 168 hours BNP: No components found with this basename: POCBNP,  D-dimer: No components found with this basename: D-DIMER,  CBG: No results found for this basename: GLUCAP,  in the last 168 hours  Radiological Exams on Admission: Dg Chest 2 View  05/29/2013   *RADIOLOGY REPORT*  Clinical Data: Shortness of breath, congestion, fever and cough.  CHEST - 2 VIEW  Comparison: Chest radiograph performed 03/16/2013  Findings: The lungs are well-aerated.  Vascular congestion is noted.  Minimal basilar density is thought to reflect overlying soft tissues.  There is no evidence of pleural effusion or pneumothorax.  The heart is normal in size; the mediastinal contour is within normal limits.  No acute osseous abnormalities are seen.  IMPRESSION: Vascular congestion noted, without significant pulmonary edema.   Original Report Authenticated By: Tonia Ghent, M.D.      Assessment/Plan  Active Problems:   Tobacco abuse   Hyperlipidemia   Unspecified constipation   Kidney stones   Fever   Leukocytosis, unspecified   UTI (urinary tract infection)   Ureteral stone with hydronephrosis   PLAN:  this lady's problems seem to center around a mild exacerbation of COPD, as well as incomplete treatment of a urinary  tract infection associated with renal stones; the reason for him completion of the treatment is allergies the  Medication.  We will resume treatment for COPD exacerbation with steroids nebulizers and oxygen supplementation;  We will continue treatment of urinary tract infection with aztreonam pending results of urine cultures; current urinalysis suggests that she is improving.  Will get an ultrasound of the kidneys and ureters see if the stone has moved and if she will need urology consult for stone extraction.  Other plans as per orders.  Code Status:  full code  Family Communication:   plans discussed with patient and her female friend at bedside with her permission  Disposition Plan:  likely home soon    Santoria Chason Nocturnist [facility] Pager [phone]   05/29/2013, 11:32 PM

## 2013-05-29 NOTE — ED Provider Notes (Signed)
History  This chart was scribed for Ward Givens, MD by Bennett Scrape, ED Scribe. This patient was seen in room [facility] and the patient's care was started at 7:57 PM.  CSN: [id number]  Arrival date & time 05/29/13  1946   First MD Initiated Contact with Patient 05/29/13 1957      Chief Complaint  Patient presents with  . Shortness of Breath  . Fever     The history is provided by the patient. No language interpreter was used.    HPI Comments: Cindy [name] is a 57 y.o. female with a h/o COPD who presents to the Emergency Department complaining of non-changing, constant lightheadedness described as feeling "out of my head" that started yesterday with associated fever and chills that started today. Fever is 100.4 in the ED.  Pt is on 2L oxygen all the time at home and reports a similar experience with low oxygen stats. Pt has been seen recently for kidney stones and developed a rash from the Toradol that was prescribed. She reports that she just finished a round of prednisone 4 days ago with improvement. Pt also states that she was put on flomax and "3 other pills" 4 days ago by her urologist at a f/u for the ongoing kidney stones (pt states she has 9) but was taken off by PCP due to rash. She states that stent placement has been discussed but pt is not a surgical candidate due to lung problems. She also reports dysuria and decreased urine output and was told to increase her Flomax pill. Pt states that she is not currently taking the Flomax due to difficulty breathing. She also reports being on a sulfa antibiotic but developed sores in her mouth and was taken off. Pt has a hard time staying on task and reports the events in a non-congruient way. She denies nausea, emesis, diarrhea and cough as associated symptoms. Pt is a current 1.0 ppd everyday smoker but denies alcohol use.  Pt reports she was seen by Dr Hillis Range 4 days ago and has an appt to see him in 2 days.  She was diagnosed with a  ureteral stone 8 days ago.   PCP is Dr. Jeanice Lim Urologist is Dr. Hillis Range  Past Medical History  Diagnosis Date  . COPD (chronic obstructive pulmonary disease)   . Shortness of breath   . Compression fracture of lumbar vertebra   . Hyperglycemia, drug-induced   . Hyperlipidemia   . Osteoporosis 2013  . Tobacco abuse   . Chronic respiratory failure On home 02 2-3L  . Thyroid disease     pt denies  . Adrenal adenoma 2014    Left  . Kidney stones 2014    Left side, multiple    Past Surgical History  Procedure Laterality Date  . Tubal ligation    . Cervical biopsy    . Colonoscopy N/A 03/31/2013    Procedure: COLONOSCOPY;  Surgeon: Malissa Hippo, MD;  Location: [facility];  Service: Endoscopy;  Laterality: N/A;  730    Family History  Problem Relation Age of Onset  . Heart disease Mother   . Hypertension Sister   . Hypertension Brother     History  Substance Use Topics  . Smoking status: Current Every Day Smoker -- 1.0 packs/day for 25 years    Types: Cigarettes  . Smokeless tobacco: Never Used  . Alcohol Use: No  lives at home Lives with spouse Home oxygen 2 lpm [place] 24/7 Currently smoking  1 ppd   No OB history provided.  Review of Systems  Constitutional: Positive for fever and chills. Negative for diaphoresis.  HENT: Negative for rhinorrhea.   Respiratory: Negative for cough and shortness of breath.   Gastrointestinal: Negative for nausea, vomiting and diarrhea.  Genitourinary: Positive for dysuria and difficulty urinating. Negative for hematuria.  Neurological: Positive for light-headedness. Negative for dizziness and syncope.  All other systems reviewed and are negative.    Allergies  Keflex; Doxycycline; Sulfa antibiotics; Avelox; and Toradol  Home Medications   Current Outpatient Rx  Name  Route  Sig  Dispense  Refill  . albuterol (PROVENTIL HFA;VENTOLIN HFA) 108 (90 BASE) MCG/ACT inhaler   Inhalation   Inhale 2 puffs into the lungs every 4  (four) hours as needed for wheezing.   1 Inhaler   2   . albuterol (PROVENTIL) (2.5 MG/3ML) 0.083% nebulizer solution   Nebulization   Take 2.5 mg by nebulization every 6 (six) hours as needed for wheezing or shortness of breath.         Marland Kitchen alendronate (FOSAMAX) 70 MG tablet   Oral   Take 70 mg by mouth every 7 (seven) days. Take with a full glass of water on an empty stomach.(wednesday)         . aspirin EC 81 MG tablet   Oral   Take 81 mg by mouth every morning.          . budesonide-formoterol (SYMBICORT) 160-4.5 MCG/ACT inhaler   Inhalation   Inhale 2 puffs into the lungs 2 (two) times daily.   1 Inhaler   11   . Calcium Carbonate-Vitamin D (CALCIUM 600/VITAMIN D) 600-400 MG-UNIT per tablet   Oral   Take 1 tablet by mouth 2 (two) times daily.         . fluticasone (FLONASE) 50 MCG/ACT nasal spray   Nasal   Place 2 sprays into the nose daily.   16 g   3   . Multiple Vitamin (MULITIVITAMIN WITH MINERALS) TABS   Oral   Take 1 tablet by mouth every morning.          Marland Kitchen oxyCODONE-acetaminophen (PERCOCET/ROXICET) 5-325 MG per tablet   Oral   Take 1 tablet by mouth every 4 (four) hours as needed for pain.   15 tablet   0   . pantoprazole (PROTONIX) 40 MG tablet   Oral   Take 1 tablet (40 mg total) by mouth daily.   90 tablet   1   . promethazine (PHENERGAN) 12.5 MG tablet   Oral   Take 1 tablet (12.5 mg total) by mouth every 6 (six) hours as needed for nausea.   30 tablet   0   . simvastatin (ZOCOR) 20 MG tablet      take 1 tablet by mouth every evening   90 tablet   1     Dispense 90 day supply   . tiotropium (SPIRIVA) 18 MCG inhalation capsule   Inhalation   Place 1 capsule (18 mcg total) into inhaler and inhale every evening. inhale contents of 1 capsule by mouth once daily   30 capsule   11   . predniSONE (DELTASONE) 10 MG tablet      Take 40mg  by mouth 5 days           Triage Vitals: BP 121/90  Pulse 130  Temp(Src) 100.4 F (38  C) (Oral)  Resp 36  Ht 5' 2.5" (1.588 m)  Wt 145 lb (65.772  kg)  BMI 26.08 kg/m2  SpO2 97%  Vital signs normal except tachycardia   Physical Exam  Nursing note and vitals reviewed. Constitutional: She is oriented to person, place, and time. She appears well-developed and well-nourished.  Non-toxic appearance. She does not appear ill. No distress.  HENT:  Head: Normocephalic and atraumatic.  Right Ear: External ear normal.  Left Ear: External ear normal.  Nose: Nose normal. No mucosal edema or rhinorrhea.  Mouth/Throat: Oropharynx is clear and moist and mucous membranes are normal. No dental abscesses or edematous.  Eyes: Conjunctivae and EOM are normal. Pupils are equal, round, and reactive to light.  Neck: Normal range of motion and full passive range of motion without pain. Neck supple.  Cardiovascular: Normal rate, regular rhythm and normal heart sounds.  Exam reveals no gallop and no friction rub.   No murmur heard. Pulmonary/Chest: Effort normal. No respiratory distress. She has decreased breath sounds. She has no wheezes. She has rhonchi. She has no rales. She exhibits no tenderness and no crepitus.  Abdominal: Soft. Normal appearance and bowel sounds are normal. She exhibits no distension. There is no tenderness. There is no rebound and no guarding.  Left CVA tenderness  Musculoskeletal: Normal range of motion. She exhibits no edema and no tenderness.  Moves all extremities well.   Neurological: She is alert and oriented to person, place, and time. She has normal strength. No cranial nerve deficit.  Skin: Skin is warm, dry and intact. No rash noted. No erythema. No pallor.  Psychiatric: She has a normal mood and affect. Her speech is normal and behavior is normal. Her mood appears not anxious.    ED Course  Procedures (including critical care time)  Medications  0.9 %  sodium chloride infusion ( Intravenous New Bag/Given 05/29/13 2100)  aztreonam (AZACTAM) injection 1 g  (not administered)  ipratropium (ATROVENT) nebulizer solution 0.5 mg (0.5 mg Nebulization Given 05/29/13 2009)  albuterol (PROVENTIL) (5 MG/ML) 0.5% nebulizer solution 5 mg (5 mg Nebulization Given 05/29/13 2008)  albuterol (PROVENTIL) (5 MG/ML) 0.5% nebulizer solution 5 mg (5 mg Nebulization Given 05/29/13 2259)  ipratropium (ATROVENT) nebulizer solution 0.5 mg (0.5 mg Nebulization Given 05/29/13 2300)    DIAGNOSTIC STUDIES: Oxygen Saturation is 97% on room air, normal by my interpretation.    COORDINATION OF CARE: 10:54 PM-Discussed treatment plan which includes medications, CXR, CBC panel, CMP, UA) with pt at bedside and pt agreed to plan.   10:56 PM-Pt rechecked and still complains of her symptoms with medications listed above. Informed pt of lab and radiology results. Discussed admission with urology eval with pt and pt agreed.  23:31 Dr Orvan Falconer, admit to med-surg for Dr Felecia Shelling  Results for orders placed during the hospital encounter of 05/29/13  CULTURE, BLOOD (ROUTINE X 2)      Result Value Range   Specimen Description Blood RIGHT ANTECUBITAL     Special Requests BOTTLES DRAWN AEROBIC AND ANAEROBIC 6CC     Culture PENDING     Report Status PENDING    CULTURE, BLOOD (ROUTINE X 2)      Result Value Range   Specimen Description Blood BLOOD RIGHT HAND     Special Requests BOTTLES DRAWN AEROBIC AND ANAEROBIC 6CC     Culture PENDING     Report Status PENDING    CBC WITH DIFFERENTIAL      Result Value Range   WBC 27.5 (*) 4.0 - 10.5 K/uL   RBC 4.38  3.87 - 5.11 MIL/uL  Hemoglobin 13.7  12.0 - 15.0 g/dL   HCT 29.5  62.1 - 30.8 %   MCV 93.6  78.0 - 100.0 fL   MCH 31.3  26.0 - 34.0 pg   MCHC 33.4  30.0 - 36.0 g/dL   RDW 65.7  84.6 - 96.2 %   Platelets 260  150 - 400 K/uL   Neutrophils Relative % 77  43 - 77 %   Neutro Abs 21.1 (*) 1.7 - 7.7 K/uL   Lymphocytes Relative 16  12 - 46 %   Lymphs Abs 4.3 (*) 0.7 - 4.0 K/uL   Monocytes Relative 7  3 - 12 %   Monocytes Absolute 1.8 (*)  0.1 - 1.0 K/uL   Eosinophils Relative 1  0 - 5 %   Eosinophils Absolute 0.2  0.0 - 0.7 K/uL   Basophils Relative 0  0 - 1 %   Basophils Absolute 0.1  0.0 - 0.1 K/uL   WBC Morphology WHITE COUNT CONFIRMED ON SMEAR    COMPREHENSIVE METABOLIC PANEL      Result Value Range   Sodium 134 (*) 135 - 145 mEq/L   Potassium 3.9  3.5 - 5.1 mEq/L   Chloride 96  96 - 112 mEq/L   CO2 31  19 - 32 mEq/L   Glucose, Bld 120 (*) 70 - 99 mg/dL   BUN 10  6 - 23 mg/dL   Creatinine, Ser 9.52  0.50 - 1.10 mg/dL   Calcium 84.1  8.4 - 32.4 mg/dL   Total Protein 7.0  6.0 - 8.3 g/dL   Albumin 3.7  3.5 - 5.2 g/dL   AST 16  0 - 37 U/L   ALT 18  0 - 35 U/L   Alkaline Phosphatase 64  39 - 117 U/L   Total Bilirubin 0.2 (*) 0.3 - 1.2 mg/dL   GFR calc non Af Amer >90  >90 mL/min   GFR calc Af Amer >90  >90 mL/min  URINALYSIS, ROUTINE W REFLEX MICROSCOPIC      Result Value Range   Color, Urine YELLOW  YELLOW   APPearance CLEAR  CLEAR   Specific Gravity, Urine 1.015  1.005 - 1.030   pH 7.0  5.0 - 8.0   Glucose, UA NEGATIVE  NEGATIVE mg/dL   Hgb urine dipstick TRACE (*) NEGATIVE   Bilirubin Urine NEGATIVE  NEGATIVE   Ketones, ur NEGATIVE  NEGATIVE mg/dL   Protein, ur NEGATIVE  NEGATIVE mg/dL   Urobilinogen, UA 0.2  0.0 - 1.0 mg/dL   Nitrite NEGATIVE  NEGATIVE   Leukocytes, UA NEGATIVE  NEGATIVE  URINE MICROSCOPIC-ADD ON      Result Value Range   Squamous Epithelial / LPF FEW (*) RARE   WBC, UA 7-10  <3 WBC/hpf   RBC / HPF 7-10  <3 RBC/hpf   Bacteria, UA FEW (*) RARE   Laboratory interpretation all normal except UTI, leukocytosis   Ct Abdomen Pelvis Wo Contrast  05/14/2013   *  IMPRESSION: 4 ml left ureteropelvic junction calculus associated with moderate left hydronephrosis.  Left nephrolithiasis.  Otherwise stable.   Original Report Authenticated By: Jolaine Click, M.D.   Dg Chest 2 View  05/29/2013   *RADIOLOGY REPORT*  Clinical Data: Shortness of breath, congestion, fever and cough.  CHEST - 2 VIEW   Comparison: Chest radiograph performed 03/16/2013  Findings: The lungs are well-aerated.  Vascular congestion is noted.  Minimal basilar density is thought to reflect overlying soft tissues.  There is no evidence  of pleural effusion or pneumothorax.  The heart is normal in size; the mediastinal contour is within normal limits.  No acute osseous abnormalities are seen.  IMPRESSION: Vascular congestion noted, without significant pulmonary edema.   Original Report Authenticated By: Tonia Ghent, M.D.     1. COPD with acute exacerbation   2. Ureteral stone with hydronephrosis   3. UTI (urinary tract infection)   4. Fever     Plan admission   Devoria Albe, MD, FACEP    MDM   I personally performed the services described in this documentation, which was scribed in my presence. The recorded information has been reviewed and considered.  Devoria Albe, MD, Armando Gang    Ward Givens, MD 05/30/13 [phone]

## 2013-05-30 ENCOUNTER — Observation Stay (HOSPITAL_COMMUNITY): Payer: Medicaid Other

## 2013-05-30 DIAGNOSIS — D72829 Elevated white blood cell count, unspecified: Secondary | ICD-10-CM | POA: Diagnosis present

## 2013-05-30 DIAGNOSIS — N132 Hydronephrosis with renal and ureteral calculous obstruction: Secondary | ICD-10-CM

## 2013-05-30 DIAGNOSIS — N390 Urinary tract infection, site not specified: Secondary | ICD-10-CM

## 2013-05-30 DIAGNOSIS — R509 Fever, unspecified: Secondary | ICD-10-CM

## 2013-05-30 LAB — COMPREHENSIVE METABOLIC PANEL
ALT: 15 U/L (ref 0–35)
AST: 14 U/L (ref 0–37)
CO2: 31 mEq/L (ref 19–32)
Chloride: 99 mEq/L (ref 96–112)
Creatinine, Ser: 0.74 mg/dL (ref 0.50–1.10)
GFR calc non Af Amer: >90 mL/min (ref >90)
Glucose, Bld: 113 mg/dL — ABNORMAL HIGH (ref 70–99)
Total Bilirubin: 0.5 mg/dL (ref 0.3–1.2)

## 2013-05-30 LAB — CBC
Hemoglobin: 13.3 g/dL (ref 12.0–15.0)
MCV: 94.5 fL (ref 78.0–100.0)
Platelets: 264 10*3/uL (ref 150–400)
RBC: 4.22 MIL/uL (ref 3.87–5.11)
WBC: 24.6 10*3/uL — ABNORMAL HIGH (ref 4.0–10.5)

## 2013-05-30 LAB — MAGNESIUM: Magnesium: 2 mg/dL (ref 1.5–2.5)

## 2013-05-30 LAB — MRSA PCR SCREENING: MRSA by PCR: NEGATIVE

## 2013-05-30 LAB — GLUCOSE, CAPILLARY

## 2013-05-30 MED ORDER — INSULIN ASPART 100 UNIT/ML ~~LOC~~ SOLN
0.0000 [IU] | Freq: Every day | SUBCUTANEOUS | Status: DC
Start: 2013-05-30 — End: 2013-05-30

## 2013-05-30 MED ORDER — DEXTROSE 5 % IV SOLN
INTRAVENOUS | Status: AC
  Filled 2013-05-30 (×2): qty 1

## 2013-05-30 MED ORDER — TIOTROPIUM BROMIDE MONOHYDRATE 18 MCG IN CAPS
18.0000 ug | ORAL_CAPSULE | Freq: Every day | RESPIRATORY_TRACT | Status: DC
  Filled 2013-05-30: qty 5

## 2013-05-30 MED ORDER — SODIUM CHLORIDE 0.9 % IV SOLN
INTRAVENOUS | Status: DC
  Administered 2013-05-30: 02:00:00 via INTRAVENOUS

## 2013-05-30 MED ORDER — AZTREONAM 1 G IJ SOLR
1.0000 g | Freq: Three times a day (TID) | INTRAMUSCULAR | Status: DC
  Filled 2013-05-30: qty 1

## 2013-05-30 MED ORDER — TRAZODONE HCL 50 MG PO TABS: 50.0000 mg | ORAL_TABLET | Freq: Every evening | ORAL | Status: DC | PRN

## 2013-05-30 MED ORDER — FLUTICASONE PROPIONATE 50 MCG/ACT NA SUSP
2.0000 | Freq: Every day | NASAL | Status: DC
  Filled 2013-05-30: qty 16

## 2013-05-30 MED ORDER — LORATADINE 10 MG PO TABS: 10.0000 mg | ORAL_TABLET | Freq: Every day | ORAL | Status: DC

## 2013-05-30 MED ORDER — ASPIRIN EC 81 MG PO TBEC: 81.0000 mg | DELAYED_RELEASE_TABLET | Freq: Every morning | ORAL | Status: DC

## 2013-05-30 MED ORDER — GUAIFENESIN ER 600 MG PO TB12: 1200.0000 mg | ORAL_TABLET | Freq: Two times a day (BID) | ORAL | Status: DC

## 2013-05-30 MED ORDER — ACETAMINOPHEN 325 MG PO TABS: 650.0000 mg | ORAL_TABLET | ORAL | Status: DC | PRN

## 2013-05-30 MED ORDER — PANTOPRAZOLE SODIUM 40 MG PO TBEC: 40.0000 mg | DELAYED_RELEASE_TABLET | Freq: Every day | ORAL | Status: DC

## 2013-05-30 MED ORDER — INSULIN ASPART 100 UNIT/ML ~~LOC~~ SOLN: 0.0000 [IU] | Freq: Three times a day (TID) | SUBCUTANEOUS | Status: DC

## 2013-05-30 MED ORDER — ENOXAPARIN SODIUM 40 MG/0.4ML ~~LOC~~ SOLN
40.0000 mg | SUBCUTANEOUS | Status: DC
  Administered 2013-05-30: 40 mg via SUBCUTANEOUS
  Filled 2013-05-30: qty 0.4

## 2013-05-30 MED ORDER — ALBUTEROL SULFATE (5 MG/ML) 0.5% IN NEBU: 5.0000 mg | INHALATION_SOLUTION | RESPIRATORY_TRACT | Status: DC | PRN

## 2013-05-30 MED ORDER — SIMVASTATIN 20 MG PO TABS: 20.0000 mg | ORAL_TABLET | Freq: Every day | ORAL | Status: DC

## 2013-05-30 MED ORDER — BUDESONIDE-FORMOTEROL FUMARATE 160-4.5 MCG/ACT IN AERO
2.0000 | INHALATION_SPRAY | Freq: Two times a day (BID) | RESPIRATORY_TRACT | Status: DC
  Filled 2013-05-30: qty 6

## 2013-05-30 MED ORDER — PREDNISONE 20 MG PO TABS: ORAL_TABLET | ORAL | Status: DC

## 2013-05-30 MED ORDER — METHYLPREDNISOLONE SODIUM SUCC 125 MG IJ SOLR
125.0000 mg | Freq: Four times a day (QID) | INTRAMUSCULAR | Status: DC
  Administered 2013-05-30: 125 mg via INTRAVENOUS
  Filled 2013-05-30: qty 2

## 2013-05-30 MED ORDER — ALBUTEROL SULFATE (5 MG/ML) 0.5% IN NEBU
2.5000 mg | INHALATION_SOLUTION | RESPIRATORY_TRACT | Status: DC
  Administered 2013-05-30 (×2): 2.5 mg via RESPIRATORY_TRACT
  Filled 2013-05-30 (×2): qty 0.5

## 2013-05-30 MED ORDER — CHLORHEXIDINE GLUCONATE CLOTH 2 % EX PADS
6.0000 | MEDICATED_PAD | Freq: Once | CUTANEOUS | Status: AC
  Administered 2013-05-30: 6 via TOPICAL

## 2013-05-30 MED ORDER — LEVOFLOXACIN 500 MG PO TABS: 500.0000 mg | ORAL_TABLET | Freq: Every day | ORAL | Status: DC

## 2013-05-30 MED ORDER — POTASSIUM CHLORIDE IN NACL 20-0.9 MEQ/L-% IV SOLN
INTRAVENOUS | Status: DC
  Administered 2013-05-30: 05:00:00 via INTRAVENOUS

## 2013-05-30 MED ORDER — ONDANSETRON HCL 4 MG/2ML IJ SOLN: 4.0000 mg | INTRAMUSCULAR | Status: DC | PRN

## 2013-05-30 NOTE — Progress Notes (Signed)
ANTIBIOTIC CONSULT NOTE - INITIAL  Pharmacy Consult for Aztreonam Indication: UTI  Allergies  Allergen Reactions  . Keflex (Cephalexin) Anaphylaxis and Rash  . Doxycycline Other (See Comments)    Gave patient oral thrush  . Sulfa Antibiotics   . Avelox (Moxifloxacin Hcl In Nacl) Itching and Rash  . Toradol (Ketorolac Tromethamine) Swelling, Rash and Other (See Comments)    Bruising, swelling, rash at injection site    Patient Measurements: Height: 5' 2.5" (158.8 cm) Weight: 141 lb 8.6 oz (64.2 kg) IBW/kg (Calculated) : 51.25  Vital Signs: Temp: 98.9 F (37.2 C) (06/02 0400) Temp src: Oral (06/02 0400) BP: 82/64 mmHg (06/02 0145) Pulse Rate: 90 (06/02 0700) Intake/Output from previous day: 06/01 0701 - 06/02 0700 In: 892.5 [P.O.:240; I.V.:652.5] Out: 600 [Urine:600] Intake/Output from this shift:    Labs:  Recent Labs  05/29/13 2057 05/30/13 0506  WBC 27.5* 24.6*  HGB 13.7 13.3  PLT 260 264  CREATININE 0.74 0.74   Estimated Creatinine Clearance: 69.2 ml/min (by C-G formula based on Cr of 0.74). No results found for this basename: VANCOTROUGH, Leodis Binet, VANCORANDOM, GENTTROUGH, GENTPEAK, GENTRANDOM, TOBRATROUGH, TOBRAPEAK, TOBRARND, AMIKACINPEAK, AMIKACINTROU, AMIKACIN,  in the last 72 hours   Microbiology: Recent Results (from the past 720 hour(s))  URINE CULTURE     Status: None   Collection Time    05/14/13  3:38 PM      Result Value Range Status   Specimen Description URINE, CLEAN CATCH   Final   Special Requests NONE   Final   Culture  Setup Time 05/14/2013 19:10   Final   Colony Count 35,000 COLONIES/ML   Final   Culture     Final   Value: Multiple bacterial morphotypes present, none predominant. Suggest appropriate recollection if clinically indicated.   Report Status 05/15/2013 FINAL   Final  CULTURE, BLOOD (ROUTINE X 2)     Status: None   Collection Time    05/29/13  8:58 PM      Result Value Range Status   Specimen Description Blood RIGHT  ANTECUBITAL   Final   Special Requests BOTTLES DRAWN AEROBIC AND ANAEROBIC 6CC   Final   Culture PENDING   Incomplete   Report Status PENDING   Incomplete  CULTURE, BLOOD (ROUTINE X 2)     Status: None   Collection Time    05/29/13  9:08 PM      Result Value Range Status   Specimen Description Blood BLOOD RIGHT HAND   Final   Special Requests BOTTLES DRAWN AEROBIC AND ANAEROBIC 6CC   Final   Culture PENDING   Incomplete   Report Status PENDING   Incomplete  MRSA PCR SCREENING     Status: None   Collection Time    05/30/13 12:23 AM      Result Value Range Status   MRSA by PCR NEGATIVE  NEGATIVE Final   Comment:            The GeneXpert MRSA Assay (FDA     approved for NASAL specimens     only), is one component of a     comprehensive MRSA colonization     surveillance program. It is not     intended to diagnose MRSA     infection nor to guide or     monitor treatment for     MRSA infections.    Medical History: Past Medical History  Diagnosis Date  . COPD (chronic obstructive pulmonary disease)   . Shortness of  breath   . Compression fracture of lumbar vertebra   . Hyperglycemia, drug-induced   . Hyperlipidemia   . Osteoporosis 2013  . Tobacco abuse   . Chronic respiratory failure On home 02 2-3L  . Thyroid disease     pt denies  . Adrenal adenoma 2014    Left  . Kidney stones 2014    Left side, multiple    Medications:  Scheduled:  . albuterol  2.5 mg Nebulization Q4H WA  . aspirin EC  81 mg Oral q morning - 10a  . aztreonam  1 g Intravenous Q8H  . budesonide-formoterol  2 puff Inhalation BID  . enoxaparin (LOVENOX) injection  40 mg Subcutaneous Q24H  . fluticasone  2 spray Each Nare Daily  . guaiFENesin  1,200 mg Oral BID  . insulin aspart  0-5 Units Subcutaneous QHS  . insulin aspart  0-9 Units Subcutaneous TID WC  . loratadine  10 mg Oral Daily  . methylPREDNISolone (SOLU-MEDROL) injection  125 mg Intravenous Q6H  . pantoprazole  40 mg Oral Daily  .  simvastatin  20 mg Oral q1800  . tiotropium  18 mcg Inhalation Daily   Assessment: 57 yo F admitted with UTI and COPD exacerbation.  Started on Aztreonam due to multiple antibiotic drug allergies.  Renal function is at patient's baseline.    Goal of Therapy:  Eradicate infection.  Plan:  1) Aztreonam 1gm IV Q8h 2) Monitor renal function and cx data   Elson Clan 05/30/2013,7:44 AM

## 2013-05-30 NOTE — Progress Notes (Signed)
Discharge instructions given with no questions. Instructed patient to start medications today. Patient waiting for ride.

## 2013-05-30 NOTE — Progress Notes (Signed)
Utilization Review Complete  

## 2013-05-30 NOTE — Progress Notes (Signed)
Patient refused education on smoking cessation. Did alert patient to remove and turn off oxygen when/while smoking. Patient acknowledged and stated she already did that.

## 2013-05-30 NOTE — Discharge Summary (Signed)
Physician Discharge Summary  Cindy [name] ZOX:[id number] DOB: August 03, 1956 DOA: 05/29/2013  PCP: Milinda Antis, MD  Admit date: 05/29/2013 Discharge date: 05/30/2013  Time spent: Greater than 30 minutes  Recommendations for Outpatient Follow-up:  1. Followup with primary care physician within a week.   Discharge Diagnoses:  1. Exacerbation of COPD, improving. 2. Possible UTI. 3. Ongoing tobacco abuse.   Discharge Condition:  Stable.  Diet recommendation: Regular.  Filed Weights   05/29/13 1949 05/30/13 0025  Weight: 65.772 kg (145 lb) 64.2 kg (141 lb 8.6 oz)    History of present illness:  This 57 year old lady presented to the hospital with symptoms of dyspnea with exertion. Please see initial history as outlined below: Cindy [name] is an 57 y.o. female. Middle-aged Caucasian lady with a history of COPD, nephrolithiasis and a recent diagnosis, weeks ago, of left UPJ stone with associated urinary tract infection, just completed a course of prednisone 4 days ago for COPD exacerbation, presents to the emergency room sudden onset of, "feeling sick" " looked into the mirror on my face looked very white" " worse when I am moving around"  In the emergency room, patient reportedly had multiple complaints with difficulty focusing, but she was concerned about her kidney stones, and bladder infection, I'm concerned about her COPD.  In addition the patient has been tried on multiple antibiotics for urinary tract infection as a complicated allergy history.  She was noted to be febrile and had a markedly elevated white count which was thought to be still related, but was otherwise looking well. She was thought to be a patient of Dr. Felecia Shelling, and the hospitalist service was asked to admit her initiate treatment for urinary tract infection and COPD, so Dr. Felecia Shelling could continue her treatment in hospital.  She has been started on aztreonam for urinary tract infection.  On evaluation on the ward, patient  is very anxious, does not appear to be in significant respiratory distress, and multiple complaints. She gets very irritable when asked about symptoms that she does not possess.  She she admits to ongoing tobacco use.  Hospital Course:  Patient was admitted and started on intravenous steroids and bronchodilators. She is made a reasonable improvement in his stabilized. She normally has home oxygen at 2 L per minute. Unfortunately she continued to smoke cigarettes and I've counseled her about this. Chest x-ray did not show any significant pneumonia. She does not have any respiratory distress that warrants continued in hospitalization treatment. She is stable to be discharged.  Procedures:  None. (i.e. Studies not automatically included, echos, thoracentesis, etc; not x-rays)  Consultations:  None.  Discharge Exam: Filed Vitals:   05/30/13 0600 05/30/13 0700 05/30/13 0727 05/30/13 0803  BP:      Pulse: 92 90    Temp:    99.4 F (37.4 C)  TempSrc:    Oral  Resp: 27 18    Height:      Weight:      SpO2: 94% 96% 96%     General: She looks chronically sick but is not acutely unwell. There is no increased work of breathing. There is no peripheral central cyanosis. Cardiovascular: Heart sounds are present in sinus rhythm. There are no murmurs or added sounds. Respiratory: Lung fields show bilateral wheezing, not tight, I suspect this is chronic for her. She is alert and orientated.  Discharge Instructions  Discharge Orders   Future Appointments Provider Department Dept Phone   06/14/2013 2:45 PM Leslye Peer, MD  [facility] [phone]   07/11/2013 8:30 AM [name], MD [facility] [phone]   Future Orders Complete By Expires     Diet - low sodium heart healthy  As directed     Increase activity slowly  As directed         Medication List    TAKE these medications       albuterol 108 (90 BASE) MCG/ACT inhaler  Commonly known as:   PROVENTIL HFA;VENTOLIN HFA  Inhale 2 puffs into the lungs every 4 (four) hours as needed for wheezing.     albuterol (2.5 MG/3ML) 0.083% nebulizer solution  Commonly known as:  PROVENTIL  Take 2.5 mg by nebulization every 6 (six) hours as needed for wheezing or shortness of breath.     alendronate 70 MG tablet  Commonly known as:  FOSAMAX  Take 70 mg by mouth every 7 (seven) days. Take with a full glass of water on an empty stomach.(wednesday)     aspirin EC 81 MG tablet  Take 81 mg by mouth every morning.     budesonide-formoterol 160-4.5 MCG/ACT inhaler  Commonly known as:  SYMBICORT  Inhale 2 puffs into the lungs 2 (two) times daily.     CALCIUM 600/VITAMIN D 600-400 MG-UNIT per tablet  Generic drug:  Calcium Carbonate-Vitamin D  Take 1 tablet by mouth 2 (two) times daily.     fluticasone 50 MCG/ACT nasal spray  Commonly known as:  FLONASE  Place 2 sprays into the nose daily.     levofloxacin 500 MG tablet  Commonly known as:  LEVAQUIN  Take 1 tablet (500 mg total) by mouth daily.     multivitamin with minerals Tabs  Take 1 tablet by mouth every morning.     oxyCODONE-acetaminophen 5-325 MG per tablet  Commonly known as:  PERCOCET/ROXICET  Take 1 tablet by mouth every 4 (four) hours as needed for pain.     pantoprazole 40 MG tablet  Commonly known as:  PROTONIX  Take 1 tablet (40 mg total) by mouth daily.     predniSONE 20 MG tablet  Commonly known as:  DELTASONE  Take 2 tablets daily for 3 days, then 1 tablet daily for 3 days, then half tablet daily for 3 days, then STOP.     promethazine 12.5 MG tablet  Commonly known as:  PHENERGAN  Take 1 tablet (12.5 mg total) by mouth every 6 (six) hours as needed for nausea.     simvastatin 20 MG tablet  Commonly known as:  ZOCOR  take 1 tablet by mouth every evening     tiotropium 18 MCG inhalation capsule  Commonly known as:  SPIRIVA  Place 1 capsule (18 mcg total) into inhaler and inhale every evening. inhale  contents of 1 capsule by mouth once daily       Allergies  Allergen Reactions  . Keflex (Cephalexin) Anaphylaxis and Rash  . Doxycycline Other (See Comments)    Gave patient oral thrush  . Sulfa Antibiotics   . Avelox (Moxifloxacin Hcl In Nacl) Itching and Rash  . Toradol (Ketorolac Tromethamine) Swelling, Rash and Other (See Comments)    Bruising, swelling, rash at injection site      The results of significant diagnostics from this hospitalization (including imaging, microbiology, ancillary and laboratory) are listed below for reference.    Significant Diagnostic Studies: Ct Abdomen Pelvis Wo Contrast  05/14/2013   *RADIOLOGY REPORT*  Clinical Data: Left flank pain  CT ABDOMEN AND  PELVIS WITHOUT CONTRAST  Technique:  Multidetector CT imaging of the abdomen and pelvis was performed following the standard protocol without intravenous contrast.  Comparison: 03/24/2013  Findings: Moderate left hydronephrosis is associated with a 4 mm left ureteropelvic junction calculus.  Several calculi are seen in the collecting system of the left kidney.  No evidence of right nephrolithiasis.  Stable left adrenal adenoma.  Unenhanced liver, gallbladder, right kidney, right adrenal gland, spleen, pancreas are within normal limits.  Normal appendix.  Uterus and adnexa are within normal limits. Unremarkable bladder.  Stable L3 compression deformity.  IMPRESSION: 4 ml left ureteropelvic junction calculus associated with moderate left hydronephrosis.  Left nephrolithiasis.  Otherwise stable.   Original Report Authenticated By: Jolaine Click, M.D.   Dg Chest 2 View  05/29/2013   *RADIOLOGY REPORT*  Clinical Data: Shortness of breath, congestion, fever and cough.  CHEST - 2 VIEW  Comparison: Chest radiograph performed 03/16/2013  Findings: The lungs are well-aerated.  Vascular congestion is noted.  Minimal basilar density is thought to reflect overlying soft tissues.  There is no evidence of pleural effusion or  pneumothorax.  The heart is normal in size; the mediastinal contour is within normal limits.  No acute osseous abnormalities are seen.  IMPRESSION: Vascular congestion noted, without significant pulmonary edema.   Original Report Authenticated By: Tonia Ghent, M.D.    Microbiology: Recent Results (from the past 240 hour(s))  CULTURE, BLOOD (ROUTINE X 2)     Status: None   Collection Time    05/29/13  8:58 PM      Result Value Range Status   Specimen Description Blood RIGHT ANTECUBITAL   Final   Special Requests BOTTLES DRAWN AEROBIC AND ANAEROBIC 6CC   Final   Culture PENDING   Incomplete   Report Status PENDING   Incomplete  CULTURE, BLOOD (ROUTINE X 2)     Status: None   Collection Time    05/29/13  9:08 PM      Result Value Range Status   Specimen Description Blood BLOOD RIGHT HAND   Final   Special Requests BOTTLES DRAWN AEROBIC AND ANAEROBIC 6CC   Final   Culture PENDING   Incomplete   Report Status PENDING   Incomplete  MRSA PCR SCREENING     Status: None   Collection Time    05/30/13 12:23 AM      Result Value Range Status   MRSA by PCR NEGATIVE  NEGATIVE Final   Comment:            The GeneXpert MRSA Assay (FDA     approved for NASAL specimens     only), is one component of a     comprehensive MRSA colonization     surveillance program. It is not     intended to diagnose MRSA     infection nor to guide or     monitor treatment for     MRSA infections.     Labs: Basic Metabolic Panel:  Recent Labs Lab 05/29/13 2057 05/30/13 0506  NA 134* 139  K 3.9 3.6  CL 96 99  CO2 31 31  GLUCOSE 120* 113*  BUN 10 9  CREATININE 0.74 0.74  CALCIUM 10.2 9.3  MG  --  2.0   Liver Function Tests:  Recent Labs Lab 05/29/13 2057 05/30/13 0506  AST 16 14  ALT 18 15  ALKPHOS 64 66  BILITOT 0.2* 0.5  PROT 7.0 6.8  ALBUMIN 3.7 3.5     CBC:  Recent Labs Lab 05/29/13 2057 05/30/13 0506  WBC 27.5* 24.6*  NEUTROABS 21.1*  --   HGB 13.7 13.3  HCT 41.0 39.9   MCV 93.6 94.5  PLT 260 264   Cardiac Enzymes: No results found for this basename: CKTOTAL, CKMB, CKMBINDEX, TROPONINI,  in the last 168 hours BNP: BNP (last 3 results)  Recent Labs  06/27/12 0837  PROBNP 61.8   CBG:  Recent Labs Lab 05/30/13 0801  GLUCAP 122*       Signed:  Fenna Semel C  [facility] 05/30/2013, 8:36 AM

## 2013-05-30 NOTE — Progress Notes (Signed)
Patient taken out of facility by staff via wheelchair. Patient left in stable condition with friend.

## 2013-05-31 ENCOUNTER — Other Ambulatory Visit: Payer: Self-pay | Admitting: Urology

## 2013-05-31 ENCOUNTER — Ambulatory Visit (HOSPITAL_COMMUNITY)
Admission: RE | Admit: 2013-05-31 | Discharge: 2013-05-31 | Disposition: A | Discharge status: Home / Self Care | Payer: Medicaid Other | Source: Ambulatory Visit | Attending: Urology | Admitting: Urology

## 2013-05-31 ENCOUNTER — Ambulatory Visit (INDEPENDENT_AMBULATORY_CARE_PROVIDER_SITE_OTHER): Payer: Medicaid Other | Admitting: Urology

## 2013-05-31 DIAGNOSIS — N200 Calculus of kidney: Secondary | ICD-10-CM | POA: Insufficient documentation

## 2013-05-31 DIAGNOSIS — N201 Calculus of ureter: Secondary | ICD-10-CM

## 2013-05-31 LAB — URINE CULTURE

## 2013-06-03 LAB — CULTURE, BLOOD (ROUTINE X 2): Culture: NO GROWTH

## 2013-06-06 ENCOUNTER — Ambulatory Visit (INDEPENDENT_AMBULATORY_CARE_PROVIDER_SITE_OTHER): Payer: Medicaid Other | Admitting: Family Medicine

## 2013-06-06 VITALS — BP 120/90 | HR 68 | Temp 98.1°F | Resp 18 | Ht 61.5 in | Wt 146.0 lb

## 2013-06-06 DIAGNOSIS — F17200 Nicotine dependence, unspecified, uncomplicated: Secondary | ICD-10-CM

## 2013-06-06 DIAGNOSIS — Z720 Tobacco use: Secondary | ICD-10-CM

## 2013-06-06 DIAGNOSIS — J449 Chronic obstructive pulmonary disease, unspecified: Secondary | ICD-10-CM

## 2013-06-06 DIAGNOSIS — M810 Age-related osteoporosis without current pathological fracture: Secondary | ICD-10-CM

## 2013-06-06 DIAGNOSIS — N390 Urinary tract infection, site not specified: Secondary | ICD-10-CM

## 2013-06-06 NOTE — Patient Instructions (Addendum)
Restart Fosamax You may also restart your baby aspirin Continue inhalers Follow-up with the lung doctor You need to quit smoking! F/U as previous in July

## 2013-06-08 ENCOUNTER — Encounter: Payer: Self-pay | Admitting: Family Medicine

## 2013-06-08 NOTE — Assessment & Plan Note (Signed)
I decided to go ahead and restart her on the Fosamax once a week we will make sure she has no reactions before starting her other medication

## 2013-06-08 NOTE — Assessment & Plan Note (Signed)
We had one discussion regarding the use of tobacco. She declines any medications and states she cannot afford the patches. States she will quit on her own. Discussed with her her decline in her breathing and COPD and her continued tobacco use is making it more more difficult to treat her.

## 2013-06-08 NOTE — Assessment & Plan Note (Signed)
Urine culture actually showed multiple colonies she has completed antibiotics no current symptoms

## 2013-06-08 NOTE — Assessment & Plan Note (Signed)
Recent exacerbation now back at her baseline

## 2013-06-08 NOTE — Progress Notes (Signed)
  Subjective:    Patient ID: Cindy [name], female    DOB: [birth year], 57 y.o.   MRN: [id number]  HPI Patient here to followup 24 hour admission for COPD exacerbation UTI. She is in with fever fatigue and shortness of breath. She was back to her baseline after 24 hours of admission. She has completed prednisone taper as well as antibiotics. She has no new concerns today. She did see urology and is felt that she passed a kidney stone. She is due to have further urinalysis workup to determine what type of stones she may have. She is followup with pulmonary next month. She continues to smoke   Review of Systems  GEN- denies fatigue, fever, weight loss,weakness, recent illness HEENT- denies eye drainage, change in vision, nasal discharge, CVS- denies chest pain, palpitations RESP- denies SOB,+ cough, wheeze ABD- denies N/V, change in stools, abd pain GU- denies dysuria, hematuria, dribbling, incontinence MSK-+ joint pain, muscle aches, injury Neuro- denies headache, dizziness, syncope, seizure activity      Objective:   Physical Exam  GEN- NAD, alert and oriented x3 HEENT- PERRL, EOMI, non injected sclera, pink conjunctiva, MMM, oropharynx clear Neck- Supple, no LAD CVS- RRR, no murmur RESP-bilateral wheeze, fair air movement,normal WOB, no rales  ABD-NABS,soft,NT,ND EXT- No edema Pulses- Radial 2+ Skin-clear , no rash or peeling on face      Assessment & Plan:

## 2013-06-09 ENCOUNTER — Telehealth: Payer: Self-pay | Admitting: Family Medicine

## 2013-06-09 NOTE — Telephone Encounter (Signed)
Take benadryl only and her breathing medications Do not take anything else until it clears up  She can try oatmeal bath for itching

## 2013-06-09 NOTE — Telephone Encounter (Signed)
All itching is under throat and neck.  States on Symbicort, it list this as reaction.  Is it from that??   Also has some hydroxyzine can she take that for itching?  Spoke to Dr Jeanice Lim,  She states tell pt to continue use symbicort and fine to use hydroxyzine for itch.  Pt called and informed

## 2013-06-14 ENCOUNTER — Ambulatory Visit (INDEPENDENT_AMBULATORY_CARE_PROVIDER_SITE_OTHER): Payer: Medicaid Other | Admitting: Emergency Medicine

## 2013-06-14 ENCOUNTER — Encounter: Payer: Self-pay | Admitting: Emergency Medicine

## 2013-06-14 VITALS — BP 122/68 | HR 104 | Temp 98.1°F | Ht 61.75 in | Wt 146.8 lb

## 2013-06-14 DIAGNOSIS — F17200 Nicotine dependence, unspecified, uncomplicated: Secondary | ICD-10-CM

## 2013-06-14 DIAGNOSIS — Z720 Tobacco use: Secondary | ICD-10-CM

## 2013-06-14 DIAGNOSIS — J449 Chronic obstructive pulmonary disease, unspecified: Secondary | ICD-10-CM

## 2013-06-14 NOTE — Assessment & Plan Note (Signed)
Not ready to quit.  

## 2013-06-14 NOTE — Assessment & Plan Note (Signed)
-   continue Spirva and Symbicort - albuterol prn.  - rov 2 months

## 2013-06-14 NOTE — Patient Instructions (Addendum)
Please continue your inhaled medications as you are taking them Wear your oxygen with exertion and while sleeping You need to quit smoking. Please call us when you are ready to set a quit date. We will help you.  Follow with Dr Delton Coombes in 2 months or sooner if you have any problems.

## 2013-06-14 NOTE — Progress Notes (Signed)
  Subjective:    Patient ID: Cindy [name], female    DOB: Feb 14, 1956, 57 y.o.   MRN: [id number]  HPI 57 yo woman, smoker (40pk-yrs), hyperlipidemia. Followed by Dr Jeanice Lim and on Spiriva + Symbicort + ProAir prn, FEV1 19% predicted on prior PFT! She has had several exacerbations over the last year. Was first noted to be hypoxemic in 2010, is on O2 that she is using prn.  She has exertional wheeze, SOB. Coughs daily - productive white, thick.   ROV 09/27/12 -- follows up COPD, hypoxemia, on Spiriva/Symbicort. 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 Since last visit has been by Dr Jeanice Lim and in the ED, treated with pred for chest tightness.  Uses albuterol every 6 hours. Hasn;t had a-1 AT yet to my knowledge.    PULMONARY FUNCTON TEST 09/27/2012  FVC 2.31  FEV1 .87  FEV1/FVC 37.7  FVC  % Predicted 76  FEV % Predicted 38  FeF 25-75 .3  FeF 25-75 % Predicted 2.65   ROV 12/01/12 -- COPD + hypoxemia, severe AFL. Returns for f/u. Continues to smoke 0.5-1.0 pk/day. About last 3 weeks, URI sx, nasal congestion, sore throat. Was treated by Dr Jeanice Lim with augmentin and prednisone. Remains on Spiriva + Symbicort.   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 Has been seen in the ED and by Dr Jeanice Lim several times since our last visit. She is finishing augmentin + pred now to treat recent AE + bronchitic sx. She continues to smoke > 1pk/day. Using SABA a few times a week. Improved cough, green sputum.   ROV 02/22/13 -- severe COPD, frequent AE's, hypoxemia. Active tobacco use. We started daliresp last time. She reports that she is doing OK, she was treated with abx/pred before our last visit 1/17. She reports constipation since last visit, ? From daliresp. She is smoking >1pk/day. She is asking for prednisone for her back pain, constipation.   ROV 04/22/13 -- Active smoker, Severe COPD, frequent AE's most recently in March (since our last appt), ? Due to  allergies. Finished pred, started fluticasone. She is dealing with pain from compression fracture. Remains on symbicort and spiriva. She has also recently been found to have L adrenal adenoma > to be seen by urology, [place].   ROV 06/14/13 -- Active smoker, Severe COPD, frequent AE's, allergic rhinitis. She was admitted for N/V, ? Renal stone/UTI, a COPD exac 6/1-6/2. She is feeling better, no evidence exacerbation right now. She is on Symbicort, Spiriva.       Objective:   Physical Exam Filed Vitals:   06/14/13 1434  BP: 122/68  Pulse: 104  Temp: 98.1 F (36.7 C)   Gen: Pleasant, well-nourished, in no distress, very tangential, difficult history giver  ENT: No lesions,  mouth clear,  oropharynx clear, no postnasal drip  Neck: No JVD, no TMG, no carotid bruits, some mild UA exp noise  Lungs: No use of accessory muscles, B rhonchi, soft B exp wheezes.   Cardiovascular: RRR, heart sounds normal, no murmur or gallops, no peripheral edema  Musculoskeletal: No deformities, no cyanosis or clubbing  Neuro: alert, non focal  Skin: Warm, no lesions or rashes     Assessment & Plan:  Tobacco abuse Not ready to quit.   C O P D - continue Spirva and Symbicort - albuterol prn.  - rov 2 months

## 2013-06-21 ENCOUNTER — Ambulatory Visit (INDEPENDENT_AMBULATORY_CARE_PROVIDER_SITE_OTHER): Payer: Medicaid Other | Admitting: Urology

## 2013-06-21 DIAGNOSIS — N200 Calculus of kidney: Secondary | ICD-10-CM

## 2013-07-11 ENCOUNTER — Ambulatory Visit (INDEPENDENT_AMBULATORY_CARE_PROVIDER_SITE_OTHER): Payer: Medicaid Other | Admitting: Family Medicine

## 2013-07-11 ENCOUNTER — Ambulatory Visit: Payer: Medicaid Other | Admitting: Family Medicine

## 2013-07-11 ENCOUNTER — Encounter: Payer: Self-pay | Admitting: Family Medicine

## 2013-07-11 VITALS — BP 118/78 | HR 84 | Temp 97.4°F | Resp 24 | Wt 145.0 lb

## 2013-07-11 DIAGNOSIS — F17200 Nicotine dependence, unspecified, uncomplicated: Secondary | ICD-10-CM

## 2013-07-11 DIAGNOSIS — T50905A Adverse effect of unspecified drugs, medicaments and biological substances, initial encounter: Secondary | ICD-10-CM

## 2013-07-11 DIAGNOSIS — E785 Hyperlipidemia, unspecified: Secondary | ICD-10-CM

## 2013-07-11 DIAGNOSIS — R7309 Other abnormal glucose: Secondary | ICD-10-CM

## 2013-07-11 DIAGNOSIS — R5381 Other malaise: Secondary | ICD-10-CM

## 2013-07-11 DIAGNOSIS — R5383 Other fatigue: Secondary | ICD-10-CM

## 2013-07-11 DIAGNOSIS — Z720 Tobacco use: Secondary | ICD-10-CM

## 2013-07-11 DIAGNOSIS — T50904A Poisoning by unspecified drugs, medicaments and biological substances, undetermined, initial encounter: Secondary | ICD-10-CM

## 2013-07-11 DIAGNOSIS — J4489 Other specified chronic obstructive pulmonary disease: Secondary | ICD-10-CM

## 2013-07-11 DIAGNOSIS — R739 Hyperglycemia, unspecified: Secondary | ICD-10-CM

## 2013-07-11 DIAGNOSIS — J449 Chronic obstructive pulmonary disease, unspecified: Secondary | ICD-10-CM

## 2013-07-11 LAB — CBC WITH DIFFERENTIAL/PLATELET
Basophils Absolute: 0.1 K/uL (ref 0.0–0.1)
Basophils Relative: 1 % (ref 0–1)
Eosinophils Absolute: 0.2 K/uL (ref 0.0–0.7)
Eosinophils Relative: 2 % (ref 0–5)
HCT: 41.1 % (ref 36.0–46.0)
Hemoglobin: 13.5 g/dL (ref 12.0–15.0)
Lymphocytes Relative: 28 % (ref 12–46)
Lymphs Abs: 2.7 K/uL (ref 0.7–4.0)
MCH: 30.5 pg (ref 26.0–34.0)
MCHC: 32.8 g/dL (ref 30.0–36.0)
MCV: 93 fL (ref 78.0–100.0)
Monocytes Absolute: 1 K/uL (ref 0.1–1.0)
Monocytes Relative: 10 % (ref 3–12)
Neutro Abs: 5.9 K/uL (ref 1.7–7.7)
Neutrophils Relative %: 59 % (ref 43–77)
Platelets: 291 K/uL (ref 150–400)
RBC: 4.42 MIL/uL (ref 3.87–5.11)
RDW: 13.8 % (ref 11.5–15.5)
WBC: 9.9 K/uL (ref 4.0–10.5)

## 2013-07-11 LAB — LIPID PANEL
Cholesterol: 218 mg/dL — ABNORMAL HIGH (ref 0–200)
HDL: 46 mg/dL
LDL Cholesterol: 151 mg/dL — ABNORMAL HIGH (ref 0–99)
Total CHOL/HDL Ratio: 4.7 ratio
Triglycerides: 105 mg/dL
VLDL: 21 mg/dL (ref 0–40)

## 2013-07-11 MED ORDER — ALBUTEROL SULFATE HFA 108 (90 BASE) MCG/ACT IN AERS: 2.0000 | INHALATION_SPRAY | RESPIRATORY_TRACT | Status: DC | PRN

## 2013-07-11 MED ORDER — ALENDRONATE SODIUM 70 MG PO TABS: 70.0000 mg | ORAL_TABLET | ORAL | Status: DC

## 2013-07-11 NOTE — Progress Notes (Signed)
  Subjective:    Patient ID: Cindy [name], female    DOB: Apr 28, 1956, 57 y.o.   MRN: [id number]  HPI  Patient to follow chronic medical problems. She complains of easy fatigability as well as shortness of breath with little exertion at times. She's also had a few episodes of dizziness but this is very rare. She denies any chest pain. Her cough and wheezing is at baseline. She is using 2 L of oxygen. Her pulmonary note was reviewed. Since her last visit she had an admission for COPD exacerbation. She was hyper glycemic secondary to steroid use however A1c was normal at 5.6%. She still has a small papule behind her left knee there's been no drainage no erythema she said it occasionally itches. She was also seen by urology for bilateral kidney stones and benign adrenal tumor was told to followup in one year Medications were reviewed    Review of Systems  GEN- +fatigue, fever, weight loss,weakness, recent illness HEENT- denies eye drainage, change in vision, nasal discharge, CVS- denies chest pain, palpitations RESP- +SOB, cough, wheeze ABD- denies N/V, change in stools, abd pain GU- denies dysuria, hematuria, dribbling, incontinence MSK- denies joint pain, muscle aches, injury Neuro- denies headache, dizziness, syncope, seizure activity       Objective:   Physical Exam  GEN- NAD, alert and oriented x3 HEENT- PERRL, EOMI, non injected sclera, pink conjunctiva, MMM, oropharynx clear Neck- Supple, no LAD CVS- RRR, no murmur RESP-bilateral wheeze, fair air movement,normal WOB, no rales  ABD-NABS,soft,NT,ND EXT- No edema Pulses- Radial 2+ Skin-clear small skin tear on Right forearm, left popliteal tiny papule, no erythema no fluctuance      Assessment & Plan:

## 2013-07-11 NOTE — Patient Instructions (Addendum)
Continue current medications We will call with lab work  Work on the smoking! F/U 3 months

## 2013-07-12 DIAGNOSIS — R5383 Other fatigue: Secondary | ICD-10-CM | POA: Insufficient documentation

## 2013-07-12 NOTE — Assessment & Plan Note (Signed)
Currently at baseline, wearing oxygen

## 2013-07-12 NOTE — Assessment & Plan Note (Signed)
A1c was rechecked during her recent hospital admission she is not diabetic

## 2013-07-12 NOTE — Assessment & Plan Note (Signed)
Unchanged, making her COPD exacerbations worse and more frequent, not ready to quit

## 2013-07-12 NOTE — Assessment & Plan Note (Addendum)
I think this is multifactorial the setting of her severe COPD. I will check her TSH based on her current illnesses and her other comorbidities.

## 2013-07-12 NOTE — Assessment & Plan Note (Signed)
Unable to tolerate statin drug. Secondary to muscle aches. I will recheck her cholesterol panel. I think for now with her multiple difficulties with medications we will just have her monitor her diet

## 2013-07-21 ENCOUNTER — Other Ambulatory Visit: Payer: Self-pay | Admitting: Family Medicine

## 2013-07-28 ENCOUNTER — Ambulatory Visit (INDEPENDENT_AMBULATORY_CARE_PROVIDER_SITE_OTHER): Payer: Medicaid Other | Admitting: Physician Assistant

## 2013-07-28 ENCOUNTER — Encounter: Payer: Self-pay | Admitting: Physician Assistant

## 2013-07-28 VITALS — BP 122/86 | HR 80 | Temp 98.0°F | Resp 18 | Wt 144.0 lb

## 2013-07-28 DIAGNOSIS — F17200 Nicotine dependence, unspecified, uncomplicated: Secondary | ICD-10-CM

## 2013-07-28 DIAGNOSIS — Z720 Tobacco use: Secondary | ICD-10-CM

## 2013-07-28 DIAGNOSIS — R0902 Hypoxemia: Secondary | ICD-10-CM

## 2013-07-28 DIAGNOSIS — J449 Chronic obstructive pulmonary disease, unspecified: Secondary | ICD-10-CM

## 2013-07-28 DIAGNOSIS — M545 Low back pain, unspecified: Secondary | ICD-10-CM

## 2013-07-28 DIAGNOSIS — J4489 Other specified chronic obstructive pulmonary disease: Secondary | ICD-10-CM

## 2013-07-28 MED ORDER — METHYLPREDNISOLONE ACETATE 80 MG/ML IJ SUSP
80.0000 mg | Freq: Once | INTRAMUSCULAR | Status: AC
  Administered 2013-07-28: 80 mg via INTRAMUSCULAR

## 2013-07-28 MED ORDER — PREDNISONE 20 MG PO TABS: ORAL_TABLET | ORAL | Status: DC

## 2013-07-28 NOTE — Progress Notes (Signed)
Patient ID: Cindy [name] MRN: [id number], DOB: 1956/09/01, 57 y.o. Date of Encounter: @DATE @  Chief Complaint:  Chief Complaint  Patient presents with  . severe back pain h/o fx in back  radiating down left leg  . short of breath    HPI: 57 y.o. year old white female  presents with above complaints.   She has c/o pain in left low back, going down lateral aspect of right leg, to knee level. No weakness in leg. Says she has past h/o back pain. In 06/2010 she lifted a lawn mower then developed pain. Had XRay, saw Spine specialist then. Has oxycod to use prn. Says only takes that when has bad flare. This episode of pain started Sunday 07/23/13. Seh took 1/2 of Oxy that afternoon and another 1/2 that night. Has taken none since.   Also c/o increased SOB. "Breathing has been worse this week." Has cough but when she is able to produce any phlegm, she says it is not yellow or green, just clear. Has had no fever/chills. No nasal mucus. Is wearing O2 currnetly.   Still smokes 1ppd!!!!!   Past Medical History  Diagnosis Date  . COPD (chronic obstructive pulmonary disease)   . Shortness of breath   . Compression fracture of lumbar vertebra   . Hyperglycemia, drug-induced   . Hyperlipidemia   . Osteoporosis 2013  . Tobacco abuse   . Chronic respiratory failure On home 02 2-3L  . Thyroid disease     pt denies  . Adrenal adenoma 2014    Left  . Kidney stones 2014    Left side, multiple     Home Meds: See attached medication section for current medication list. Any medications entered into computer today will not appear on this note's list. The medications listed below were entered prior to today. Current Outpatient Prescriptions on File Prior to Visit  Medication Sig Dispense Refill  . albuterol (PROVENTIL HFA;VENTOLIN HFA) 108 (90 BASE) MCG/ACT inhaler Inhale 2 puffs into the lungs every 4 (four) hours as needed for wheezing.  1 Inhaler  6  . albuterol (PROVENTIL) (2.5 MG/3ML) 0.083%  nebulizer solution Take 2.5 mg by nebulization every 6 (six) hours as needed for wheezing or shortness of breath.      Marland Kitchen alendronate (FOSAMAX) 70 MG tablet Take 1 tablet (70 mg total) by mouth every 7 (seven) days. Take with a full glass of water on an empty stomach.(wednesday)  4 tablet  11  . aspirin EC 81 MG tablet Take 81 mg by mouth every morning.       . budesonide-formoterol (SYMBICORT) 160-4.5 MCG/ACT inhaler Inhale 2 puffs into the lungs 2 (two) times daily.  1 Inhaler  11  . Calcium Carbonate-Vitamin D (CALCIUM 600/VITAMIN D) 600-400 MG-UNIT per tablet Take 1 tablet by mouth 2 (two) times daily.      . Multiple Vitamin (MULITIVITAMIN WITH MINERALS) TABS Take 1 tablet by mouth every morning.       . pantoprazole (PROTONIX) 40 MG tablet take 1 tablet by mouth once daily  90 tablet  1  . tiotropium (SPIRIVA) 18 MCG inhalation capsule Place 1 capsule (18 mcg total) into inhaler and inhale every evening. inhale contents of 1 capsule by mouth once daily  30 capsule  11   No current facility-administered medications on file prior to visit.    Allergies:  Allergies  Allergen Reactions  . Keflex (Cephalexin) Anaphylaxis and Rash  . Doxycycline Other (See Comments)    Gave  patient oral thrush  . Sulfa Antibiotics   . Avelox (Moxifloxacin Hcl In Nacl) Itching and Rash  . Toradol (Ketorolac Tromethamine) Swelling, Rash and Other (See Comments)    Bruising, swelling, rash at injection site    History   Social History  . Marital Status: Divorced    Spouse Name: N/A    Number of Children: 3  . Years of Education: N/A   Occupational History  . disabled    Social History Main Topics  . Smoking status: Current Every Day Smoker -- 0.50 packs/day for 25 years    Types: Cigarettes  . Smokeless tobacco: Never Used  . Alcohol Use: No  . Drug Use: No  . Sexually Active: No   Other Topics Concern  . Not on file   Social History Narrative  . No narrative on file    Family History   Problem Relation Age of Onset  . Heart disease Mother   . Hypertension Sister   . Hypertension Brother      Review of Systems:  See HPI for pertinent ROS. All other ROS negative.    Physical Exam: Blood pressure 122/86, pulse 80, temperature 98 F (36.7 C), temperature source Oral, resp. rate 18, weight 144 lb (65.318 kg), SpO2 98.00%., Body mass index is 26.57 kg/(m^2).  SaO2 was performed with her O2 on at 2L/min [place].  General: WNWD WF. Wearing her [place] O2. Appears in no acute distress. Head: Normocephalic, atraumatic, eyes without discharge, sclera non-icteric, nares are without discharge. Bilateral auditory canals clear, TM's are without perforation, pearly grey and translucent with reflective cone of light bilaterally. Oral cavity moist, posterior pharynx without exudate, erythema, peritonsillar abscess, or post nasal drip.  Neck: Supple. No thyromegaly. No lymphadenopathy. Lungs: She has wheezes present bilaterally throughout but they are mild and there is still good air movement. Does not sound really "tight" or with really decreased BS/air movement.  SaO2 on [place] O2 2L/min is 98% Heart: RRR with S1 S2. No murmurs, rubs, or gallops. Musculoskeletal:  Strength and tone normal for age. Back: Pain is just Left of midline at approx level of L4. Left sciatic notch tender with palpation.  Bilateral SLR are the same: can lift to 45 degrees then has to stop sec to pain in left low back Bilateral Hi abduction is normal  2+ , equal patella reflexes bilaterally. Strenght 5/5 in both legs and both feet with dorsiflexion.  Neuro: Alert and oriented X 3. Moves all extremities spontaneously. Gait is normal. CNII-XII grossly in tact. Psych:  Responds to questions appropriately with a normal affect.     ASSESSMENT AND PLAN:  57 y.o. year old female with  1. C O P D Cont current meds. Add prednisone for flare. IM now. Start oral pred tomorrow. If breathing worsens or does not return to [place],  f/u. - predniSONE (DELTASONE) 20 MG tablet; Take 3 daily for 2 days, then 2 daily for 2 days, then 1 daily for 2 days.  Dispense: 12 tablet; Refill: 0  2. Hypoxia Cont O2  3. Tobacco abuse Still smokes 1PPD. !! Discussed cessation. Discussed Chantix. She says she could not afford this in past. Defers Rx  4. Lumbar back pain I reviewed MRI LUMBAR SPINE done 03/08/2013. See that report. Will treat sciatica with prednisone. If pain in back and leg do not resolve, f/u .  - predniSONE (DELTASONE) 20 MG tablet; Take 3 daily for 2 days, then 2 daily for 2 days, then 1 daily for  2 days.  Dispense: 12 tablet; Refill: 0 Start oral pred tomorrow.  - methylPREDNISolone acetate (DEPO-MEDROL) injection 80 mg; Inject 1 mL (80 mg total) into the muscle once.   Murray Hodgkins [place], [place], [employer] 07/28/2013 2:06 PM

## 2013-08-12 NOTE — Telephone Encounter (Signed)
Just found this message in our in-basket.  I believe this was sent to the wrong person as I work in Radiology.  Thanks, Erskine Squibb

## 2013-08-16 ENCOUNTER — Ambulatory Visit (INDEPENDENT_AMBULATORY_CARE_PROVIDER_SITE_OTHER): Payer: Medicare Other | Admitting: Family Medicine

## 2013-08-16 ENCOUNTER — Encounter: Payer: Self-pay | Admitting: Family Medicine

## 2013-08-16 VITALS — BP 138/90 | HR 98 | Temp 98.1°F | Resp 24 | Wt 144.0 lb

## 2013-08-16 DIAGNOSIS — J441 Chronic obstructive pulmonary disease with (acute) exacerbation: Secondary | ICD-10-CM

## 2013-08-16 DIAGNOSIS — F17200 Nicotine dependence, unspecified, uncomplicated: Secondary | ICD-10-CM

## 2013-08-16 DIAGNOSIS — Z720 Tobacco use: Secondary | ICD-10-CM

## 2013-08-16 MED ORDER — ALBUTEROL SULFATE (2.5 MG/3ML) 0.083% IN NEBU: 2.5000 mg | INHALATION_SOLUTION | RESPIRATORY_TRACT | Status: DC | PRN

## 2013-08-16 MED ORDER — PREDNISONE 10 MG PO TABS: ORAL_TABLET | ORAL | Status: DC

## 2013-08-16 MED ORDER — AMOXICILLIN-POT CLAVULANATE 875-125 MG PO TABS: 1.0000 | ORAL_TABLET | Freq: Two times a day (BID) | ORAL | Status: DC

## 2013-08-16 MED ORDER — METHYLPREDNISOLONE ACETATE 80 MG/ML IJ SUSP
80.0000 mg | Freq: Once | INTRAMUSCULAR | Status: AC
  Administered 2013-08-16: 80 mg via INTRAMUSCULAR

## 2013-08-16 NOTE — Addendum Note (Signed)
Addended by: Donne Anon on: 08/16/2013 09:34 AM   Modules accepted: Orders

## 2013-08-16 NOTE — Assessment & Plan Note (Signed)
Unchanged continues to smoke Despite oxygen requirement and worsening COPD Continue to counsel

## 2013-08-16 NOTE — Assessment & Plan Note (Signed)
Treat for acute exercabation Depo Medrol 80mg  IM  Start prednisone taper tomorrow [place] Neb treatments Mucinex

## 2013-08-16 NOTE — Patient Instructions (Addendum)
Start antibiotics Mucinex 1 tablet twice a day  Start prednisone tomorrow  Use the albuterol nebs every 4 hours  F/U as needed or go to ER if worse

## 2013-08-16 NOTE — Progress Notes (Signed)
  Subjective:    Patient ID: Cindy [name], female    DOB: Aug 15, 1956, 57 y.o.   MRN: [id number]  HPI  Pt here for COPD exacerbation. She began wheezing and coughing 2 days ago her shortness of breath worsened over the past 2 days but she did not want to go to the emergency room. She has been using Mucinex and gave herself a breathing treatment yesterday. Her cough is productive of yellow-green sputum. He's been wearing her oxygen throughout the day she states when she turns off she feels like it dropped very low. Denies chest pain or sick contacts. She continues to smoke a pack a day   Review of Systems - per above  GEN- denies fatigue, fever, weight loss,weakness, recent illness HEENT- denies eye drainage, change in vision, nasal discharge, CVS- denies chest pain, palpitations RESP- +SOB,+ cough, +wheeze ABD- denies N/V, change in stools, abd pain Neuro- denies headache, dizziness, syncope, seizure activity       Objective:   Physical Exam  GEN- NAD, alert and oriented x3 HEENT- PERRL, EOMI, non injected sclera, pink conjunctiva, MMM, oropharynx clear,  Neck- Supple, no LAD, +mild retractions CVS- RRR, no murmur RESP- Increased WOB, bilalteral wheeze to bases, mild rhonchi bilaterally, fair air movement, on 2L EXT- No edema Pulses- Radial 2+ Skin- ecchymosis and bruising scattered   S/p neb fair air movement, improved bronchospasm more comfortable appearing Walking oxygen sat with 2L s/p neb 92-94%     Assessment & Plan:

## 2013-08-17 ENCOUNTER — Telehealth: Payer: Self-pay | Admitting: Family Medicine

## 2013-08-17 NOTE — Telephone Encounter (Signed)
Let her know there is nothing we can do about the skin bruising right now it is due to thinning skin   Triage to see if facial swelling is allergic reaction or not to meds? Schedule apt for Friday if needed with me

## 2013-08-17 NOTE — Telephone Encounter (Signed)
Tried to call pt back and got a vm, told pt to seek immediate medical attention if problems breathing or facial swelling gets worse otherwise call us back in the morning.

## 2013-08-18 ENCOUNTER — Telehealth: Payer: Self-pay | Admitting: Family Medicine

## 2013-08-18 NOTE — Telephone Encounter (Signed)
Pt called back this morning to tell you that she is going to be ok. She just has to stay on oxygen all of the time so that her O2 levels do not drop. She said that her face swelling is ok and that she does not need to be seen.

## 2013-08-18 NOTE — Telephone Encounter (Signed)
noted 

## 2013-08-25 ENCOUNTER — Ambulatory Visit (INDEPENDENT_AMBULATORY_CARE_PROVIDER_SITE_OTHER): Payer: Medicaid Other | Admitting: Emergency Medicine

## 2013-08-25 ENCOUNTER — Encounter: Payer: Self-pay | Admitting: Emergency Medicine

## 2013-08-25 VITALS — BP 130/86 | HR 105 | Ht 62.5 in | Wt 142.8 lb

## 2013-08-25 DIAGNOSIS — J449 Chronic obstructive pulmonary disease, unspecified: Secondary | ICD-10-CM

## 2013-08-25 NOTE — Patient Instructions (Addendum)
You need to continue to work on stopping smoking.  Continue your Spiriva and Symbicort as you are taking them Finish your prednisone as planned.  Continue to use albuterol as needed for shortness of breath. Follow with Dr Delton Coombes in 2 months or sooner if you have any problems.

## 2013-08-25 NOTE — Assessment & Plan Note (Signed)
You need to continue to work on stopping smoking.  Continue your Spiriva and Symbicort as you are taking them Finish your prednisone as planned.  Continue to use albuterol as needed for shortness of breath. Follow with Dr Hether Anselmo in 2 months or sooner if you have any problems.  

## 2013-08-25 NOTE — Progress Notes (Signed)
Subjective:    Patient ID: Cindy [name], female    DOB: 1956/08/23, 57 y.o.   MRN: [id number]  HPI 57 yo woman, smoker (40pk-yrs), hyperlipidemia. Followed by Dr Jeanice Lim and on Spiriva + Symbicort + ProAir prn, FEV1 19% predicted on prior PFT! She has had several exacerbations over the last year. Was first noted to be hypoxemic in 2010, is on O2 that she is using prn.  She has exertional wheeze, SOB. Coughs daily - productive white, thick.   ROV 09/27/12 -- follows up COPD, hypoxemia, on Spiriva/Symbicort. 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 Since last visit has been by Dr Jeanice Lim and in the ED, treated with pred for chest tightness.  Uses albuterol every 6 hours. Hasn;t had a-1 AT yet to my knowledge.    PULMONARY FUNCTON TEST 09/27/2012  FVC 2.31  FEV1 .87  FEV1/FVC 37.7  FVC  % Predicted 76  FEV % Predicted 38  FeF 25-75 .3  FeF 25-75 % Predicted 2.65   ROV 12/01/12 -- COPD + hypoxemia, severe AFL. Returns for f/u. Continues to smoke 0.5-1.0 pk/day. About last 3 weeks, URI sx, nasal congestion, sore throat. Was treated by Dr Jeanice Lim with augmentin and prednisone. Remains on Spiriva + Symbicort.   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 Has been seen in the ED and by Dr Jeanice Lim several times since our last visit. She is finishing augmentin + pred now to treat recent AE + bronchitic sx. She continues to smoke > 1pk/day. Using SABA a few times a week. Improved cough, green sputum.   ROV 02/22/13 -- severe COPD, frequent AE's, hypoxemia. Active tobacco use. We started daliresp last time. She reports that she is doing OK, she was treated with abx/pred before our last visit 1/17. She reports constipation since last visit, ? From daliresp. She is smoking >1pk/day. She is asking for prednisone for her back pain, constipation.   ROV 04/22/13 -- Active smoker, Severe COPD, frequent AE's most recently in March (since our last appt), ? Due to  allergies. Finished pred, started fluticasone. She is dealing with pain from compression fracture. Remains on symbicort and spiriva. She has also recently been found to have L adrenal adenoma > to be seen by urology, [place].   ROV 06/14/13 -- Active smoker, Severe COPD, frequent AE's, allergic rhinitis. She was admitted for N/V, ? Renal stone/UTI, a COPD exac 6/1-6/2. She is feeling better, no evidence exacerbation right now. She is on Symbicort, Spiriva.   ROV 08/25/13 -- Active smoker, Severe COPD, frequent AE's, allergic rhinitis. She c/o worsening over the last week >  Having radicular LE pain, she has had more chest tightness this week and SOB. She saw Dr Jeanice Lim last week and has been treated with augmentin+ pred taper. She has improved after rx for AE.      Objective:   Physical Exam Filed Vitals:   08/25/13 0936  BP: 130/86  Pulse: 105   Gen: Pleasant, well-nourished, in no distress, very tangential, difficult history giver  ENT: No lesions,  mouth clear,  oropharynx clear, no postnasal drip  Neck: No JVD, no TMG, no carotid bruits, some mild UA exp noise  Lungs: No use of accessory muscles, B rhonchi, soft B exp wheezes.   Cardiovascular: RRR, heart sounds normal, no murmur or gallops, no peripheral edema  Musculoskeletal: No deformities, no cyanosis or clubbing  Neuro: alert, non focal  Skin: Warm, no lesions  or rashes     Assessment & Plan:  C O P D You need to continue to work on stopping smoking.  Continue your Spiriva and Symbicort as you are taking them Finish your prednisone as planned.  Continue to use albuterol as needed for shortness of breath. Follow with Dr Delton Coombes in 2 months or sooner if you have any problems

## 2013-09-07 ENCOUNTER — Telehealth: Payer: Self-pay | Admitting: Family Medicine

## 2013-09-07 NOTE — Telephone Encounter (Signed)
Pt called back and said that she went to the pharmacy and got some cough drops and they seem to help some. She will call back if she gets worse.

## 2013-09-07 NOTE — Telephone Encounter (Signed)
Left message to return call 

## 2013-10-07 ENCOUNTER — Encounter: Payer: Self-pay | Admitting: Family Medicine

## 2013-10-07 ENCOUNTER — Ambulatory Visit (INDEPENDENT_AMBULATORY_CARE_PROVIDER_SITE_OTHER): Payer: Medicare Other | Admitting: Family Medicine

## 2013-10-07 VITALS — BP 122/76 | HR 80 | Temp 98.5°F | Resp 20 | Wt 145.0 lb

## 2013-10-07 DIAGNOSIS — J449 Chronic obstructive pulmonary disease, unspecified: Secondary | ICD-10-CM | POA: Diagnosis not present

## 2013-10-07 DIAGNOSIS — M810 Age-related osteoporosis without current pathological fracture: Secondary | ICD-10-CM | POA: Diagnosis not present

## 2013-10-07 DIAGNOSIS — E785 Hyperlipidemia, unspecified: Secondary | ICD-10-CM

## 2013-10-07 DIAGNOSIS — J4489 Other specified chronic obstructive pulmonary disease: Secondary | ICD-10-CM | POA: Diagnosis not present

## 2013-10-07 DIAGNOSIS — Z23 Encounter for immunization: Secondary | ICD-10-CM | POA: Diagnosis not present

## 2013-10-07 DIAGNOSIS — K219 Gastro-esophageal reflux disease without esophagitis: Secondary | ICD-10-CM

## 2013-10-07 MED ORDER — ACETAMINOPHEN-CODEINE 120-12 MG/5ML PO SOLN: 5.0000 mL | Freq: Four times a day (QID) | ORAL | Status: DC | PRN

## 2013-10-07 MED ORDER — PREDNISONE 10 MG PO TABS: ORAL_TABLET | ORAL | Status: DC

## 2013-10-07 NOTE — Patient Instructions (Signed)
Take mucinex If you get worse then start the prednisone Cough medication also sent for at bedtime Continue all other medications Flu shot given  Call if the simvastatin makes your legs hurt F/U 3 months

## 2013-10-09 NOTE — Assessment & Plan Note (Signed)
Continue meds mucinex She is not in an acute exacerbation, but she often deteriorates quickly I will send over prednisone to have in case of need

## 2013-10-09 NOTE — Assessment & Plan Note (Signed)
Continue PPI, on bisphosphonate as well due to osteoporosis

## 2013-10-09 NOTE — Assessment & Plan Note (Signed)
Doing well on fosamax and Calcium Needs repeat Dexa in March 2015

## 2013-10-09 NOTE — Assessment & Plan Note (Signed)
Pt wants to restart statin drug and see how she does, will monitor

## 2013-10-09 NOTE — Progress Notes (Signed)
  Subjective:    Patient ID: Cindy [name], female    DOB: Oct 14, 1956, 57 y.o.   MRN: [id number]  HPI  Pt here to f/u chronic medical problems. COPD- Due for flu shot, has had a few episodes where she "turns white in the face" using oxygen during the day. Continue to smoke, cough increased some over past week, still with clear sputum.  Hyperlipidemia- wants to try statin again, does not think it really caused her legs to hurt  Medications reviewed    Review of Systems  GEN- denies fatigue, fever, weight loss,weakness, recent illness HEENT- denies eye drainage, change in vision, nasal discharge, CVS- denies chest pain, palpitations RESP- denies SOB, +cough, wheeze ABD- denies N/V, change in stools, abd pain GU- denies dysuria, hematuria, dribbling, incontinence MSK- denies joint pain, muscle aches, injury Neuro- denies headache, dizziness, syncope, seizure activity      Objective:   Physical Exam  GEN- NAD, alert and oriented x3, well appearing HEENT- PERRL, EOMI, non injected sclera, pink conjunctiva, MMM, oropharynx clear Neck- Supple, no LAD CVS- RRR, no murmur RESP-good air movement, few scattered wheeze, no rhonchi EXT- No edema Pulses- Radial 2+         Assessment & Plan:

## 2013-10-11 ENCOUNTER — Ambulatory Visit: Payer: Medicaid Other | Admitting: Family Medicine

## 2013-10-24 ENCOUNTER — Ambulatory Visit (INDEPENDENT_AMBULATORY_CARE_PROVIDER_SITE_OTHER): Payer: Medicare Other | Admitting: Physician Assistant

## 2013-10-24 ENCOUNTER — Encounter: Payer: Self-pay | Admitting: Physician Assistant

## 2013-10-24 VITALS — BP 142/88 | HR 94 | Temp 98.3°F | Resp 20 | Wt 147.0 lb

## 2013-10-24 DIAGNOSIS — J449 Chronic obstructive pulmonary disease, unspecified: Secondary | ICD-10-CM

## 2013-10-24 DIAGNOSIS — J209 Acute bronchitis, unspecified: Secondary | ICD-10-CM

## 2013-10-24 DIAGNOSIS — J441 Chronic obstructive pulmonary disease with (acute) exacerbation: Secondary | ICD-10-CM

## 2013-10-24 DIAGNOSIS — Z720 Tobacco use: Secondary | ICD-10-CM

## 2013-10-24 DIAGNOSIS — F17200 Nicotine dependence, unspecified, uncomplicated: Secondary | ICD-10-CM

## 2013-10-24 MED ORDER — METHYLPREDNISOLONE ACETATE 80 MG/ML IJ SUSP
80.0000 mg | Freq: Once | INTRAMUSCULAR | Status: AC
  Administered 2013-10-24: 80 mg via INTRAMUSCULAR

## 2013-10-24 MED ORDER — AZITHROMYCIN 250 MG PO TABS: ORAL_TABLET | ORAL | Status: DC

## 2013-10-24 MED ORDER — PREDNISONE 20 MG PO TABS: ORAL_TABLET | ORAL | Status: DC

## 2013-10-24 NOTE — Progress Notes (Signed)
Patient ID: Cindy [name] MRN: [id number], DOB: 02/23/1956, 57 y.o. Date of Encounter: 10/24/2013, 4:28 PM    Chief Complaint:  Chief Complaint  Patient presents with  . sick    cough and respiratory parts     HPI: 57 y.o. year old white female reports that she has been sick with this for about 2-3 weeks.  She actually saw Dr. Jeanice Lim for a regular office visit on 10/07/13. At that visit she reported some increased cough and wheeze. Dr. Pola Corn gave her a prescription for prednisone to start if needed. Patient says that she started to on Sunday, October 19. Says that she is down to the lowest dose of 10 mg now.  However says that she is worse now than she was to begin with. Says that her nose is congested and stopped up and dry. Is getting out no mucus or drainage. SAys that her chest is "rumbling." Getting up very little phlegm and when she is getting up is still clear. Has had no fevers or chills.  Prior to this she was using oxygen only with exertion. Now she is wearing her oxygen all the time.  Also prior to this she was smoking still.     Home Meds: See attached medication section for any medications that were entered at today's visit. The computer does not put those onto this list.The following list is a list of meds entered prior to today's visit.   Current Outpatient Prescriptions on File Prior to Visit  Medication Sig Dispense Refill  . acetaminophen-codeine 120-12 MG/5ML solution Take 5 mLs by mouth every 6 (six) hours as needed for pain.  120 mL  1  . albuterol (PROVENTIL HFA;VENTOLIN HFA) 108 (90 BASE) MCG/ACT inhaler Inhale 2 puffs into the lungs every 4 (four) hours as needed for wheezing.  1 Inhaler  6  . albuterol (PROVENTIL) (2.5 MG/3ML) 0.083% nebulizer solution Take 3 mL (2.5 mg total) by nebulization every 4 (four) hours as needed for wheezing or shortness of breath.  75 mL  11  . alendronate (FOSAMAX) 70 MG tablet Take 1 tablet (70 mg total) by mouth every 7  (seven) days. Take with a full glass of water on an empty stomach.(wednesday)  4 tablet  11  . aspirin EC 81 MG tablet Take 81 mg by mouth every morning.       . budesonide-formoterol (SYMBICORT) 160-4.5 MCG/ACT inhaler Inhale 2 puffs into the lungs 2 (two) times daily.  1 Inhaler  11  . Calcium Carbonate-Vitamin D (CALCIUM 600/VITAMIN D) 600-400 MG-UNIT per tablet Take 1 tablet by mouth 2 (two) times daily.      . Multiple Vitamin (MULITIVITAMIN WITH MINERALS) TABS Take 1 tablet by mouth every morning.       Marland Kitchen oxyCODONE-acetaminophen (PERCOCET/ROXICET) 5-325 MG per tablet Take 1 tablet by mouth every 4 (four) hours as needed for pain.      . pantoprazole (PROTONIX) 40 MG tablet take 1 tablet by mouth once daily  90 tablet  1  . predniSONE (DELTASONE) 10 MG tablet Take 60mg  x 3 days, 40mg  x 3 days x 20mg  x 3 days, 10mg  x 3 days  39 tablet  0  . simvastatin (ZOCOR) 20 MG tablet Take 20 mg by mouth every evening.      . tiotropium (SPIRIVA) 18 MCG inhalation capsule Place 1 capsule (18 mcg total) into inhaler and inhale every evening. inhale contents of 1 capsule by mouth once daily  30 capsule  11  No current facility-administered medications on file prior to visit.    Allergies:  Allergies  Allergen Reactions  . Keflex [Cephalexin] Anaphylaxis and Rash  . Doxycycline Other (See Comments)    Gave patient oral thrush  . Sulfa Antibiotics   . Avelox [Moxifloxacin Hcl In Nacl] Itching and Rash  . Toradol [Ketorolac Tromethamine] Swelling, Rash and Other (See Comments)    Bruising, swelling, rash at injection site      Review of Systems: See HPI for pertinent ROS. All other ROS negative.    Physical Exam: Blood pressure 142/88, pulse 94, temperature 98.3 F (36.8 C), temperature source Oral, resp. rate 20, weight 147 lb (66.679 kg), SpO2 97.00%., Body mass index is 26.44 kg/(m^2). General: WNWD WF on [place] 02.  Appears in no acute distress. HEENT: Normocephalic, atraumatic, eyes without  discharge, sclera non-icteric, nares are without discharge. Bilateral auditory canals clear, TM's are without perforation, pearly grey and translucent with reflective cone of light bilaterally. Oral cavity moist, posterior pharynx without exudate, erythema, peritonsillar abscess, or post nasal drip.  Neck: Supple. No thyromegaly. No lymphadenopathy. Lungs: She has tight wheezes throughout bilaterally with decreased air movement. Heart: Regular rhythm. No murmurs, rubs, or gallops. Abdomen: Soft, non-tender, non-distended with normoactive bowel sounds. No hepatomegaly. No rebound/guarding. No obvious abdominal masses. Msk:  Strength and tone normal for age. Extremities/Skin: Warm and dry. No clubbing or cyanosis. No edema. No rashes or suspicious lesions. Neuro: Alert and oriented X 3. Moves all extremities spontaneously. Gait is normal. CNII-XII grossly in tact. Psych:  Responds to questions appropriately with a normal affect.     ASSESSMENT AND PLAN:  57 y.o. year old female with  1. Acute bronchitis We'll give her Depo-Medrol 80 mg IM now. She will start the below prednisone taper tomorrow. Go-ahead and start antibiotic today. Followup if symptoms worsen or develops fever or if symptoms do not resolve with completion of medication. Cont all of her routine COPD medications including her oxygen. - azithromycin (ZITHROMAX) 250 MG tablet; Day 1: Take 2 Daily.    Days 2-5: Take 1 daily.  Dispense: 6 tablet; Refill: 0 - predniSONE (DELTASONE) 20 MG tablet; Take 3 daily for 2 days, then 2 daily for 2 days, then 1 daily for 2 days.  Dispense: 12 tablet; Refill: 0  2. COPD exacerbation - predniSONE (DELTASONE) 20 MG tablet; Take 3 daily for 2 days, then 2 daily for 2 days, then 1 daily for 2 days.  Dispense: 12 tablet; Refill: 0  3. C O P D  4. Tobacco abuse   Signed, Shon Hale Fox, [place], [facility] 10/24/2013 4:28 PM

## 2013-10-27 ENCOUNTER — Ambulatory Visit (INDEPENDENT_AMBULATORY_CARE_PROVIDER_SITE_OTHER): Payer: Medicaid Other | Admitting: Emergency Medicine

## 2013-10-27 ENCOUNTER — Encounter: Payer: Self-pay | Admitting: Emergency Medicine

## 2013-10-27 VITALS — BP 120/78 | HR 119 | Ht 62.0 in | Wt 147.0 lb

## 2013-10-27 DIAGNOSIS — J449 Chronic obstructive pulmonary disease, unspecified: Secondary | ICD-10-CM

## 2013-10-27 DIAGNOSIS — J441 Chronic obstructive pulmonary disease with (acute) exacerbation: Secondary | ICD-10-CM

## 2013-10-27 NOTE — Patient Instructions (Signed)
Please continue your medications as you are using them Wear your oxygen with exertion Work on stopping or cutting down your smoking.  Follow with Dr Delton Coombes in 2 months

## 2013-10-27 NOTE — Progress Notes (Signed)
Subjective:    Patient ID: Cindy [name], female    DOB: 03-Sep-1956, 57 y.o.   MRN: [id number]  HPI 57 yo woman, smoker (40pk-yrs), hyperlipidemia. Followed by Dr Jeanice Lim and on Spiriva + Symbicort + ProAir prn, FEV1 19% predicted on prior PFT! She has had several exacerbations over the last year. Was first noted to be hypoxemic in 2010, is on O2 that she is using prn.  She has exertional wheeze, SOB. Coughs daily - productive white, thick.   ROV 09/27/12 -- follows up COPD, hypoxemia, on Spiriva/Symbicort. 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 Since last visit has been by Dr Jeanice Lim and in the ED, treated with pred for chest tightness.  Uses albuterol every 6 hours. Hasn;t had a-1 AT yet to my knowledge.   ROV 12/01/12 -- COPD + hypoxemia, severe AFL. Returns for f/u. Continues to smoke 0.5-1.0 pk/day. About last 3 weeks, URI sx, nasal congestion, sore throat. Was treated by Dr Jeanice Lim with augmentin and prednisone. Remains on Spiriva + Symbicort.   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 Has been seen in the ED and by Dr Jeanice Lim several times since our last visit. She is finishing augmentin + pred now to treat recent AE + bronchitic sx. She continues to smoke > 1pk/day. Using SABA a few times a week. Improved cough, green sputum.   ROV 02/22/13 -- severe COPD, frequent AE's, hypoxemia. Active tobacco use. We started daliresp last time. She reports that she is doing OK, she was treated with abx/pred before our last visit 1/17. She reports constipation since last visit, ? From daliresp. She is smoking >1pk/day. She is asking for prednisone for her back pain, constipation.   ROV 04/22/13 -- Active smoker, Severe COPD, frequent AE's most recently in March (since our last appt), ? Due to allergies. Finished pred, started fluticasone. She is dealing with pain from compression fracture. Remains on symbicort and spiriva. She has also recently been  found to have L adrenal adenoma > to be seen by urology, [place].   ROV 06/14/13 -- Active smoker, Severe COPD, frequent AE's, allergic rhinitis. She was admitted for N/V, ? Renal stone/UTI, a COPD exac 6/1-6/2. She is feeling better, no evidence exacerbation right now. She is on Symbicort, Spiriva.   ROV 08/25/13 -- Active smoker, Severe COPD, frequent AE's, allergic rhinitis. She c/o worsening over the last week >  Having radicular LE pain, she has had more chest tightness this week and SOB. She saw Dr Jeanice Lim last week and has been treated with augmentin+ pred taper. She has improved after rx for AE.   ROV 10/27/13 -- Active smoker, Severe COPD, frequent AE's, allergic rhinitis. Here for f/u. She tells me that she feels very well, just treated with prednisone + azithro for nasal congestion > finally improved. On Spiriva + Symbicort. Wears her O2 with some exertion but not all. Smoking 1 pk a day.   PULMONARY FUNCTON TEST 09/27/2012  FVC 2.31  FEV1 .87  FEV1/FVC 37.7  FVC  % Predicted 76  FEV % Predicted 38  FeF 25-75 .3  FeF 25-75 % Predicted 2.65       Objective:   Physical Exam  Filed Vitals:   10/27/13 1342  BP: 120/78  Pulse: 119  Height: 5\' 2"  (1.575 m)  Weight: 147 lb (66.679 kg)  SpO2: 98%   Gen: Pleasant, well-nourished, in no distress, very tangential, difficult history giver  ENT:  No lesions,  mouth clear,  oropharynx clear, no postnasal drip  Neck: No JVD, no TMG, no carotid bruits, some mild UA exp noise  Lungs: No use of accessory muscles, B rhonchi, soft B exp wheezes.   Cardiovascular: RRR, heart sounds normal, no murmur or gallops, no peripheral edema  Musculoskeletal: No deformities, no cyanosis or clubbing  Neuro: alert, non focal  Skin: Warm, no lesions or rashes     Assessment & Plan:  C O P D Treated for a recent AE, mainly for nasal congestion and sinus disease. Continues to smoke. Good compliance w BD's, marginal compliance w O2.  - continue  same BD regimen - encouraged o2 with exertion - encouraged tobacco cessation.  - rov2

## 2013-10-27 NOTE — Assessment & Plan Note (Signed)
Treated for a recent AE, mainly for nasal congestion and sinus disease. Continues to smoke. Good compliance w BD's, marginal compliance w O2.  - continue same BD regimen - encouraged o2 with exertion - encouraged tobacco cessation.  - rov2

## 2013-11-02 ENCOUNTER — Encounter: Payer: Self-pay | Admitting: Family Medicine

## 2013-11-02 ENCOUNTER — Ambulatory Visit (INDEPENDENT_AMBULATORY_CARE_PROVIDER_SITE_OTHER): Payer: Medicaid Other | Admitting: Family Medicine

## 2013-11-02 VITALS — BP 110/70 | HR 120 | Temp 98.1°F | Resp 18 | Ht 60.5 in | Wt 147.0 lb

## 2013-11-02 DIAGNOSIS — M545 Low back pain, unspecified: Secondary | ICD-10-CM

## 2013-11-02 DIAGNOSIS — J441 Chronic obstructive pulmonary disease with (acute) exacerbation: Secondary | ICD-10-CM

## 2013-11-02 DIAGNOSIS — R21 Rash and other nonspecific skin eruption: Secondary | ICD-10-CM | POA: Diagnosis not present

## 2013-11-02 MED ORDER — PANTOPRAZOLE SODIUM 40 MG PO TBEC: DELAYED_RELEASE_TABLET | ORAL | Status: DC

## 2013-11-02 MED ORDER — PREDNISONE 10 MG PO TABS: ORAL_TABLET | ORAL | Status: DC

## 2013-11-02 MED ORDER — TIOTROPIUM BROMIDE MONOHYDRATE 18 MCG IN CAPS: 18.0000 ug | ORAL_CAPSULE | Freq: Every evening | RESPIRATORY_TRACT | Status: DC

## 2013-11-02 MED ORDER — NYSTATIN 100000 UNIT/ML MT SUSP: 5.0000 mL | Freq: Four times a day (QID) | OROMUCOSAL | Status: DC

## 2013-11-02 MED ORDER — ALENDRONATE SODIUM 70 MG PO TABS: 70.0000 mg | ORAL_TABLET | ORAL | Status: DC

## 2013-11-02 MED ORDER — CLOTRIMAZOLE 1 % EX CREA: 1.0000 "application " | TOPICAL_CREAM | Freq: Two times a day (BID) | CUTANEOUS | Status: DC

## 2013-11-02 NOTE — Patient Instructions (Addendum)
Use the albuterol nebulizer twice a day  Wear oxygen around the clock  Use the anti-fungal cream twice a day  Use nasal saline for nose Continue mucinex Use the nystatin for mouth  Start the prednisone  F/U Monday for recheck

## 2013-11-03 DIAGNOSIS — R21 Rash and other nonspecific skin eruption: Secondary | ICD-10-CM | POA: Insufficient documentation

## 2013-11-03 NOTE — Progress Notes (Signed)
  Subjective:    Patient ID: Cindy [name], female    DOB: January 24, 1956, 57 y.o.   MRN: [id number]  HPI  Pt here with cough, with white production, wheezing, SOB for past week. Treated for COPD exacerbation about 2 weeks ago completed prednisone this past Sunday. She has been suffering with her COPD the past 4 weeks straight. Without oxygen gets "white in the face" and can feel her breathing and rattling down in to lower back. Cough is making back pain worse, known compression fracture. She does not use oxygen throughout the day.   Rash on buttocks noticed about 1 week ago, used ETOH wipe now feels rash. +icthing    Review of Systems  GEN- denies fatigue, fever, weight loss,weakness, recent illness HEENT- denies eye drainage, change in vision, nasal discharge, CVS- denies chest pain, palpitations RESP- + SOB, +cough,+ wheeze MSK- + joint pain, muscle aches, injury Neuro- denies headache, dizziness, syncope, seizure activity      Objective:   Physical Exam  GEN- NAD, alert and oriented x3 HEENT- PERRL, EOMI, non injected sclera, pink conjunctiva, MMM, oropharynx clear,  Neck- Supple, no LAD, no retractions CVS- tachycardia HR 110, no murmur RESP- normal WOB at rest, bilalteral wheeze to bases, mild rhonchi bilaterally, fair air movement, on 2L EXT- No edema Pulses- Radial 2+ Skin- gluteal cleft, mild erythema, bilat buttocks, erythematous plaque nickle size, no veiscles, no papules noted        Assessment & Plan:

## 2013-11-03 NOTE — Assessment & Plan Note (Signed)
Numerous exacerbations Continues to smoke May discuss low dose prednisone daily for her Failed Daliresp Given low dose taper No antibiotics needed ALbuterol neb BID Continue spiriva [employer]

## 2013-11-03 NOTE — Assessment & Plan Note (Signed)
Treat with topical antifungal. 

## 2013-11-03 NOTE — Assessment & Plan Note (Signed)
Worsened due to continuous cough Has pain meds at home

## 2013-11-07 ENCOUNTER — Ambulatory Visit (INDEPENDENT_AMBULATORY_CARE_PROVIDER_SITE_OTHER): Payer: Medicare Other | Admitting: Family Medicine

## 2013-11-07 ENCOUNTER — Ambulatory Visit (HOSPITAL_COMMUNITY)
Admission: RE | Admit: 2013-11-07 | Discharge: 2013-11-07 | Disposition: A | Discharge status: Home / Self Care | Payer: Medicaid Other | Source: Ambulatory Visit | Attending: Family Medicine | Admitting: Family Medicine

## 2013-11-07 ENCOUNTER — Encounter: Payer: Self-pay | Admitting: Family Medicine

## 2013-11-07 VITALS — BP 120/80 | HR 92 | Temp 98.0°F | Resp 20 | Ht 60.5 in | Wt 148.0 lb

## 2013-11-07 DIAGNOSIS — J441 Chronic obstructive pulmonary disease with (acute) exacerbation: Secondary | ICD-10-CM

## 2013-11-07 DIAGNOSIS — R079 Chest pain, unspecified: Secondary | ICD-10-CM | POA: Insufficient documentation

## 2013-11-07 NOTE — Patient Instructions (Signed)
COntinue current medications Get the chest xray today  F/U 3 months

## 2013-11-07 NOTE — Progress Notes (Signed)
  Subjective:    Patient ID: Cindy [name], female    DOB: 06-12-1956, 56 y.o.   MRN: [id number]  HPI  Pt here to f/u COPD exacerbation, did fairly well over the weekend. Continues to cough and wheeze, SOB with exertion. No fever. Using meds as prescribed, on 20mg  of prednisone currently. Wearing oxygen most of day   Review of Systems  GEN- denies fatigue, fever, weight loss,weakness, recent illness HEENT- denies eye drainage, change in vision, nasal discharge, CVS- denies chest pain, palpitations RESP- denies SOB, +cough,+ wheeze Neuro- denies headache, dizziness, syncope, seizure activity      Objective:   Physical Exam  GEN- NAD, alert and oriented x3 HEENT- PERRL, EOMI, non injected sclera, pink conjunctiva, MMM, oropharynx clear,  Neck- Supple, no LAD, no retractions CVS- RRR, no murmur RESP- normal WOB at rest, bilalteral wheeze to bases, decreased air movement at bases.2 L, mild rhonchi, EXT- No edema Pulses- Radial 2+      Assessment & Plan:

## 2013-11-07 NOTE — Assessment & Plan Note (Signed)
Slowly improving, complete prednisone taper CXR shows no infiltrate  Continue nebs/symbicort

## 2013-12-12 ENCOUNTER — Ambulatory Visit (INDEPENDENT_AMBULATORY_CARE_PROVIDER_SITE_OTHER): Payer: Medicare Other | Admitting: Family Medicine

## 2013-12-12 ENCOUNTER — Encounter: Payer: Self-pay | Admitting: Family Medicine

## 2013-12-12 VITALS — BP 130/80 | HR 98 | Temp 98.5°F | Resp 18 | Ht 60.5 in | Wt 149.0 lb

## 2013-12-12 DIAGNOSIS — J441 Chronic obstructive pulmonary disease with (acute) exacerbation: Secondary | ICD-10-CM

## 2013-12-12 DIAGNOSIS — E785 Hyperlipidemia, unspecified: Secondary | ICD-10-CM

## 2013-12-12 MED ORDER — PREDNISONE 10 MG PO TABS: ORAL_TABLET | ORAL | Status: DC

## 2013-12-12 MED ORDER — METHYLPREDNISOLONE ACETATE 80 MG/ML IJ SUSP
80.0000 mg | Freq: Once | INTRAMUSCULAR | Status: AC
  Administered 2013-12-12: 80 mg via INTRAMUSCULAR

## 2013-12-12 MED ORDER — BUDESONIDE-FORMOTEROL FUMARATE 160-4.5 MCG/ACT IN AERO: 2.0000 | INHALATION_SPRAY | Freq: Two times a day (BID) | RESPIRATORY_TRACT | Status: DC

## 2013-12-12 MED ORDER — SIMVASTATIN 20 MG PO TABS: 20.0000 mg | ORAL_TABLET | Freq: Every evening | ORAL | Status: DC

## 2013-12-12 MED ORDER — AMOXICILLIN-POT CLAVULANATE 875-125 MG PO TABS
1.0000 | ORAL_TABLET | Freq: Two times a day (BID) | ORAL | Status: DC
Start: 2013-12-12 — End: 2014-02-01

## 2013-12-12 NOTE — Assessment & Plan Note (Signed)
Okay to decrease to Three times a week to see if this improves muscle aches

## 2013-12-12 NOTE — Assessment & Plan Note (Addendum)
Patient here with recurrent COPD exacerbation. She's had multiple exacerbations over the past year. I've given her IM Depo-Medrol. She will start a prolonged steroid taper. She does have followup with pulmonary this Friday. She's been given red flags if she declined she is to go to the emergency room She will also start Augmentin. Nebulizer treatments every 4 hours. I will recheck her in 24 hours time Mucinex Home oxygen therapy

## 2013-12-12 NOTE — Patient Instructions (Signed)
Start prednisone tomorrow Take antibiotics Use nebulizer every 4 hours as needed Go to ER if you get worse Recheck tomorrow

## 2013-12-12 NOTE — Progress Notes (Signed)
   Subjective:    Patient ID: Cindy [name], female    DOB: 1956-02-17, 57 y.o.   MRN: [id number]  HPI Patient presents with cough shortness of breath and tightness for the past 3 days. Her symptoms started on Friday. This is similar to her previous COPD exacerbations. She has not been able to get much out with her cough. She has used her nebulizer in the morning and evening otherwise has been taking her regular inhalers. She continues to smoke. She's been wearing 2 L of oxygen throughout the day.  She also states that her cholesterol medication has been causing some muscle aches again but she wants to continue it  Review of Systems  GEN- denies fatigue, fever, weight loss,weakness, recent illness HEENT- denies eye drainage, change in vision, nasal discharge, CVS- denies chest pain, palpitations RESP- denies SOB, +cough, +wheeze ABD- denies N/V, change in stools, abd pain Neuro- denies headache, dizziness, syncope, seizure activity      Objective:   Physical Exam  GEN- NAD, alert and oriented x3 HEENT- PERRL, EOMI, non injected sclera, pink conjunctiva, MMM, oropharynx clear,  Neck- Supple, no LAD,  CVS- mild tachycardia, no murmur RESP- , bilalteral wheeze and bronchospasm, decreased air movement , + rhonchi, increased WOB  EXT- No edema Pulses- Radial 2+   S/p neb- improved air movement, bilateral wheeze, sat 94% 2L     Assessment & Plan:

## 2013-12-13 ENCOUNTER — Ambulatory Visit (INDEPENDENT_AMBULATORY_CARE_PROVIDER_SITE_OTHER): Payer: Medicaid Other | Admitting: Family Medicine

## 2013-12-13 VITALS — BP 108/64 | HR 98 | Temp 98.5°F | Resp 18 | Ht 60.5 in | Wt 149.0 lb

## 2013-12-13 DIAGNOSIS — J441 Chronic obstructive pulmonary disease with (acute) exacerbation: Secondary | ICD-10-CM | POA: Diagnosis not present

## 2013-12-13 NOTE — Assessment & Plan Note (Addendum)
She is much improved today. Her oxygen sats look good. She will continue the prednisone taper and continue her antibiotics.

## 2013-12-13 NOTE — Progress Notes (Signed)
   Subjective:    Patient ID: Cindy [name], female    DOB: 1956-09-29, 57 y.o.   MRN: [id number]  HPI  Pt here for 24 hour recheck on COPD. Yesterday given neb in office, Depo Medrol injection and started on prednisone taper and augmentin. She is using nebulizer every 4 hours as prescribed, oxygen at 2 L. States she feels much better today.  Review of Systems - per above  GEN- denies fatigue, fever, weight loss,weakness, recent illness HEENT- denies eye drainage, change in vision, nasal discharge, CVS- denies chest pain, palpitations RESP- denies SOB, +cough, +wheeze          Objective:   Physical Exam GEN- NAD, alert and oriented x3,  CVS- mild tachycardia, no murmur RESP- , bilalteral wheeze , improved air movement, + rhonchi, easy WOB at rest, no retractions  EXT- No edema Pulses- Radial 2+        Assessment & Plan:

## 2013-12-13 NOTE — Patient Instructions (Signed)
Continue every 4 hours Continue prednisone as prescribed and antibiotics  F/U as previous in Feb

## 2013-12-16 ENCOUNTER — Encounter: Payer: Self-pay | Admitting: Emergency Medicine

## 2013-12-16 ENCOUNTER — Ambulatory Visit (INDEPENDENT_AMBULATORY_CARE_PROVIDER_SITE_OTHER): Payer: Medicaid Other | Admitting: Emergency Medicine

## 2013-12-16 VITALS — BP 140/94 | HR 95 | Ht 62.0 in | Wt 151.0 lb

## 2013-12-16 DIAGNOSIS — J449 Chronic obstructive pulmonary disease, unspecified: Secondary | ICD-10-CM

## 2013-12-16 NOTE — Assessment & Plan Note (Signed)
With very frequent exacerbations. Very difficult case. I do not believe we are going to be able to get her bronchospasm under control until she stops smoking. At this point she is not ready to set a quit date. I could consider daliresp, will discuss w her next time. We briefly discussed staying on a low dose of prednisone today - I believe that this would ultimately be harmful to her. Aggressive smoking cessation seems to be her best option.  - continue same meds - complete augmentin and pred - frequent followup visits. Offered to follow her monthly but she is concerned that her insurance is going to change and her co-pay may increase. We will reassess after the new year. If her co-pay is stable then I will see her more frequently.

## 2013-12-16 NOTE — Patient Instructions (Signed)
Please continue your Spiriva and Symbicort as you have been taking  Please continue to work on stopping smoking Use your albuterol as needed Follow with Dr Delton Coombes in 2 months or sooner if you have any problems.

## 2013-12-16 NOTE — Progress Notes (Signed)
Subjective:    Patient ID: Cindy [name], female    DOB: Aug 29, 1956, 57 y.o.   MRN: [id number]  HPI 57 yo woman, smoker (40pk-yrs), hyperlipidemia. Followed by Dr Jeanice Lim and on Spiriva + Symbicort + ProAir prn, FEV1 19% predicted on prior PFT! She has had several exacerbations over the last year. Was first noted to be hypoxemic in 2010, is on O2 that she is using prn.  She has exertional wheeze, SOB. Coughs daily - productive white, thick.   ROV 09/27/12 -- follows up COPD, hypoxemia, on Spiriva/Symbicort. 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 Since last visit has been by Dr Jeanice Lim and in the ED, treated with pred for chest tightness.  Uses albuterol every 6 hours. Hasn;t had a-1 AT yet to my knowledge.   ROV 12/01/12 -- COPD + hypoxemia, severe AFL. Returns for f/u. Continues to smoke 0.5-1.0 pk/day. About last 3 weeks, URI sx, nasal congestion, sore throat. Was treated by Dr Jeanice Lim with augmentin and prednisone. Remains on Spiriva + Symbicort.   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 Has been seen in the ED and by Dr Jeanice Lim several times since our last visit. She is finishing augmentin + pred now to treat recent AE + bronchitic sx. She continues to smoke > 1pk/day. Using SABA a few times a week. Improved cough, green sputum.   ROV 02/22/13 -- severe COPD, frequent AE's, hypoxemia. Active tobacco use. We started daliresp last time. She reports that she is doing OK, she was treated with abx/pred before our last visit 1/17. She reports constipation since last visit, ? From daliresp. She is smoking >1pk/day. She is asking for prednisone for her back pain, constipation.   ROV 04/22/13 -- Active smoker, Severe COPD, frequent AE's most recently in March (since our last appt), ? Due to allergies. Finished pred, started fluticasone. She is dealing with pain from compression fracture. Remains on symbicort and spiriva. She has also recently been  found to have L adrenal adenoma > to be seen by urology, [place].   ROV 06/14/13 -- Active smoker, Severe COPD, frequent AE's, allergic rhinitis. She was admitted for N/V, ? Renal stone/UTI, a COPD exac 6/1-6/2. She is feeling better, no evidence exacerbation right now. She is on Symbicort, Spiriva.   ROV 08/25/13 -- Active smoker, Severe COPD, frequent AE's, allergic rhinitis. She c/o worsening over the last week >  Having radicular LE pain, she has had more chest tightness this week and SOB. She saw Dr Jeanice Lim last week and has been treated with augmentin+ pred taper. She has improved after rx for AE.   ROV 10/27/13 -- Active smoker, Severe COPD, frequent AE's, allergic rhinitis. Here for f/u. She tells me that she feels very well, just treated with prednisone + azithro for nasal congestion > finally improved. On Spiriva + Symbicort. Wears her O2 with some exertion but not all. Smoking 1 pk a day.   ROV 12/16/13 -- Active smoker > 1 pack/day, Severe COPD, frequent AE's, allergic rhinitis. She presents today for f/u. She developed some chest tightness, increased nasal congestion, cough with clear phlegm, possible chills, no documented fevers. She saw Dr Jeanice Lim and was treated for an AE with solumedrol, prednisone and augmentin.    PULMONARY FUNCTON TEST 09/27/2012  FVC 2.31  FEV1 .87  FEV1/FVC 37.7  FVC  % Predicted 76  FEV % Predicted 38  FeF 25-75 .3  FeF 25-75 % Predicted 2.65  Objective:   Physical Exam Filed Vitals:   12/16/13 0858  BP: 140/94  Pulse: 95  Height: 5\' 2"  (1.575 m)  Weight: 151 lb (68.493 kg)  SpO2: 97%   Gen: Pleasant, well-nourished, in no distress, very tangential, difficult history giver  ENT: No lesions,  mouth clear,  oropharynx clear, no postnasal drip  Neck: No JVD, no TMG, no carotid bruits, some mild UA exp noise  Lungs: No use of accessory muscles, B rhonchi, soft B exp wheezes.   Cardiovascular: RRR, heart sounds normal, no murmur or gallops,  no peripheral edema  Musculoskeletal: No deformities, no cyanosis or clubbing  Neuro: alert, non focal  Skin: Warm, no lesions or rashes     Assessment & Plan:  C O P D With very frequent exacerbations. Very difficult case. I do not believe we are going to be able to get her bronchospasm under control until she stops smoking. At this point she is not ready to set a quit date. I could consider daliresp, will discuss w her next time. We briefly discussed staying on a low dose of prednisone today - I believe that this would ultimately be harmful to her. Aggressive smoking cessation seems to be her best option.  - continue same meds - complete augmentin and pred - frequent followup visits. Offered to follow her monthly but she is concerned that her insurance is going to change and her co-pay may increase. We will reassess after the new year. If her co-pay is stable then I will see her more frequently.

## 2013-12-26 ENCOUNTER — Telehealth: Payer: Self-pay | Admitting: Family Medicine

## 2013-12-26 NOTE — Telephone Encounter (Signed)
Insurance changes beginning of year.  [employer] not approved with Medicare.  Told patient need to call medicare provider and find out who is under their service.

## 2013-12-26 NOTE — Telephone Encounter (Signed)
Please call her regarding her oxygen

## 2013-12-27 ENCOUNTER — Telehealth: Payer: Self-pay | Admitting: Family Medicine

## 2013-12-27 ENCOUNTER — Ambulatory Visit: Payer: Medicare Other | Admitting: Family Medicine

## 2013-12-27 VITALS — HR 87

## 2013-12-27 DIAGNOSIS — J449 Chronic obstructive pulmonary disease, unspecified: Secondary | ICD-10-CM

## 2013-12-27 NOTE — Telephone Encounter (Signed)
PT is needing help figuring out her oxygen situation something to do with medicare and [employer] Call back number is [phone]

## 2013-12-27 NOTE — Progress Notes (Signed)
Patient ID: Cindy [name], female   DOB: [birth year], 57 y.o.   MRN: [id number] Medicare is requiring her to change her Oxygen provider as of the beginning of the year.  She has chosen to switch to [facility] for her Oxygen.  We need to supply them with current oxygen levels to re certify her for them.  Pt wears oxygen at 2L/min all the time.  Her O2 pulse OX is 97%  P-87     Off O2 for 5 minutes  O2 pulse OX drops to 86%  P-101     Ambulatory without O2 pulse OX drops to 82%  P-108     Ambulatory with oxygen at 2L/min  Pulse OX is 95%  P-92  Oxygen levels provided to [employer] for a transfer of her Oxygen Services

## 2013-12-28 NOTE — Telephone Encounter (Signed)
LMTRC

## 2014-01-16 ENCOUNTER — Other Ambulatory Visit: Payer: Self-pay | Admitting: *Deleted

## 2014-01-16 MED ORDER — ALBUTEROL SULFATE (2.5 MG/3ML) 0.083% IN NEBU: 2.5000 mg | INHALATION_SOLUTION | RESPIRATORY_TRACT | Status: DC | PRN

## 2014-01-18 ENCOUNTER — Telehealth: Payer: Self-pay | Admitting: Family Medicine

## 2014-01-18 NOTE — Telephone Encounter (Signed)
Pt is calling back to give the [place] [facility] number [phone] Call back number is [phone]

## 2014-02-01 ENCOUNTER — Ambulatory Visit (INDEPENDENT_AMBULATORY_CARE_PROVIDER_SITE_OTHER): Payer: Medicare Other | Admitting: Family Medicine

## 2014-02-01 VITALS — BP 112/70 | HR 68 | Temp 98.2°F | Resp 18 | Ht 60.5 in | Wt 153.0 lb

## 2014-02-01 DIAGNOSIS — J441 Chronic obstructive pulmonary disease with (acute) exacerbation: Secondary | ICD-10-CM | POA: Diagnosis not present

## 2014-02-01 DIAGNOSIS — H5789 Other specified disorders of eye and adnexa: Secondary | ICD-10-CM | POA: Diagnosis not present

## 2014-02-01 MED ORDER — PREDNISONE 10 MG PO TABS: ORAL_TABLET | ORAL | Status: DC

## 2014-02-01 MED ORDER — AMOXICILLIN-POT CLAVULANATE 875-125 MG PO TABS
1.0000 | ORAL_TABLET | Freq: Two times a day (BID) | ORAL | Status: DC
Start: 2014-02-01 — End: 2014-02-10

## 2014-02-01 MED ORDER — METHYLPREDNISOLONE ACETATE 40 MG/ML IJ SUSP
40.0000 mg | Freq: Once | INTRAMUSCULAR | Status: AC
  Administered 2014-02-01: 40 mg via INTRAMUSCULAR

## 2014-02-01 NOTE — Patient Instructions (Signed)
Take antibiotics and prednisone Use nebulizer every 4 hours  Continue oxygen Continue the artificial tears  F/U as previous

## 2014-02-02 ENCOUNTER — Encounter: Payer: Self-pay | Admitting: Family Medicine

## 2014-02-02 DIAGNOSIS — H5789 Other specified disorders of eye and adnexa: Secondary | ICD-10-CM | POA: Insufficient documentation

## 2014-02-02 NOTE — Assessment & Plan Note (Signed)
No sign of acute infection, for irritation she can use visine, this may be viral related with her COPD exacerbation

## 2014-02-02 NOTE — Assessment & Plan Note (Signed)
Neb in office Depo Medrol, start prednisone taper Augmentin Reviewed last pulmonary note NEEDS TO QUIT SMOKING!

## 2014-02-02 NOTE — Progress Notes (Signed)
Patient ID: Cindy [name], female   DOB: [birth year], 58 y.o.   MRN: [id number]   Subjective:    Patient ID: Cindy [name], female    DOB: 20-Dec-1956, 58 y.o.   MRN: [id number]  Patient presents for Shortness of Breath and COLD IN EYES Chronic severe COPD, lasted treated for exacerbation in December has done okay since then. Past few days increased SOB, wheeze, cough with production. CONTINUES TO SMOKE, she is  on 2 L oxygen. Noticed clear drainage from both eyes past 2 days, feels like there is something in them, she did try her artificial tears but it did not help, also picked up visine. No change in vision    Review Of Systems:  GEN- denies fatigue, fever, weight loss,weakness, recent illness HEENT- +eye drainage, change in vision, nasal discharge, CVS- denies chest pain, palpitations RESP- + SOB,+ cough,+ wheeze Neuro- denies headache, dizziness, syncope, seizure activity       Objective:    BP 112/70  Pulse 68  Temp(Src) 98.2 F (36.8 C) (Oral)  Resp 18  Ht 5' 0.5" (1.537 m)  Wt 153 lb (69.4 kg)  BMI 29.38 kg/m2  SpO2 98% GEN- NAD, alert and oriented x3 HEENT- PERRL, EOMI, non injected sclera, pink conjunctiva, MMM, oropharynx clear,  Neck- Supple, no LAD,  CVS- RRR, no murmur RESP- , bilalteral wheeze and bronchospasm, decreased air movement , + rhonchi, fairly normal WOB at rest sat 98% on 2L EXT- No edema Pulses- Radial 2+        Assessment & Plan:      Problem List Items Addressed This Visit   Eye drainage     No sign of acute infection, for irritation she can use visine, this may be viral related with her COPD exacerbation    COPD exacerbation - Primary     Neb in office Depo Medrol, start prednisone taper Augmentin Reviewed last pulmonary note NEEDS TO QUIT SMOKING!    Relevant Medications      methylPREDNISolone acetate (DEPO-MEDROL) injection 40 mg (Completed)      predniSONE (DELTASONE) tablet      Note: This dictation was prepared with Dragon  dictation along with smaller phrase technology. Any transcriptional errors that result from this process are unintentional.

## 2014-02-07 ENCOUNTER — Ambulatory Visit: Payer: Medicaid Other | Admitting: Family Medicine

## 2014-02-10 ENCOUNTER — Encounter: Payer: Self-pay | Admitting: Family Medicine

## 2014-02-10 ENCOUNTER — Ambulatory Visit (INDEPENDENT_AMBULATORY_CARE_PROVIDER_SITE_OTHER): Payer: Medicare Other | Admitting: Family Medicine

## 2014-02-10 VITALS — BP 120/82 | HR 94 | Temp 98.2°F | Resp 18 | Ht 60.5 in | Wt 150.0 lb

## 2014-02-10 DIAGNOSIS — F17200 Nicotine dependence, unspecified, uncomplicated: Secondary | ICD-10-CM | POA: Diagnosis not present

## 2014-02-10 DIAGNOSIS — J449 Chronic obstructive pulmonary disease, unspecified: Secondary | ICD-10-CM | POA: Diagnosis not present

## 2014-02-10 DIAGNOSIS — T50904A Poisoning by unspecified drugs, medicaments and biological substances, undetermined, initial encounter: Secondary | ICD-10-CM

## 2014-02-10 DIAGNOSIS — R739 Hyperglycemia, unspecified: Secondary | ICD-10-CM

## 2014-02-10 DIAGNOSIS — T50905A Adverse effect of unspecified drugs, medicaments and biological substances, initial encounter: Secondary | ICD-10-CM

## 2014-02-10 DIAGNOSIS — R7309 Other abnormal glucose: Secondary | ICD-10-CM

## 2014-02-10 DIAGNOSIS — E785 Hyperlipidemia, unspecified: Secondary | ICD-10-CM | POA: Diagnosis not present

## 2014-02-10 DIAGNOSIS — Z720 Tobacco use: Secondary | ICD-10-CM

## 2014-02-10 DIAGNOSIS — M810 Age-related osteoporosis without current pathological fracture: Secondary | ICD-10-CM

## 2014-02-10 NOTE — Patient Instructions (Addendum)
Continue current medications Finish medications for COPD Get the labs done fasting F/U 2 months - PHYSICAL WITH PAP

## 2014-02-11 DIAGNOSIS — R7309 Other abnormal glucose: Secondary | ICD-10-CM | POA: Diagnosis not present

## 2014-02-11 DIAGNOSIS — E785 Hyperlipidemia, unspecified: Secondary | ICD-10-CM | POA: Diagnosis not present

## 2014-02-11 DIAGNOSIS — T50904A Poisoning by unspecified drugs, medicaments and biological substances, undetermined, initial encounter: Secondary | ICD-10-CM | POA: Diagnosis not present

## 2014-02-11 LAB — COMPREHENSIVE METABOLIC PANEL
ALT: 23 U/L (ref 0–35)
AST: 20 U/L (ref 0–37)
Albumin: 4.6 g/dL (ref 3.5–5.2)
Alkaline Phosphatase: 53 U/L (ref 39–117)
BUN: 13 mg/dL (ref 6–23)
CALCIUM: 10.3 mg/dL (ref 8.4–10.5)
CHLORIDE: 97 meq/L (ref 96–112)
CO2: 35 meq/L — AB (ref 19–32)
CREATININE: 0.86 mg/dL (ref 0.50–1.10)
Glucose, Bld: 72 mg/dL (ref 70–99)
Potassium: 4.5 mEq/L (ref 3.5–5.3)
Sodium: 141 mEq/L (ref 135–145)
TOTAL PROTEIN: 7.2 g/dL (ref 6.0–8.3)
Total Bilirubin: 0.5 mg/dL (ref 0.2–1.2)

## 2014-02-11 LAB — CBC WITH DIFFERENTIAL/PLATELET
Basophils Absolute: 0 10*3/uL (ref 0.0–0.1)
Basophils Relative: 0 % (ref 0–1)
EOS ABS: 0.1 10*3/uL (ref 0.0–0.7)
EOS PCT: 1 % (ref 0–5)
HCT: 47.6 % — ABNORMAL HIGH (ref 36.0–46.0)
Hemoglobin: 15.9 g/dL — ABNORMAL HIGH (ref 12.0–15.0)
LYMPHS ABS: 4.5 10*3/uL — AB (ref 0.7–4.0)
Lymphocytes Relative: 32 % (ref 12–46)
MCH: 31.3 pg (ref 26.0–34.0)
MCHC: 33.4 g/dL (ref 30.0–36.0)
MCV: 93.7 fL (ref 78.0–100.0)
Monocytes Absolute: 1.5 10*3/uL — ABNORMAL HIGH (ref 0.1–1.0)
Monocytes Relative: 10 % (ref 3–12)
NEUTROS PCT: 57 % (ref 43–77)
Neutro Abs: 8.2 10*3/uL — ABNORMAL HIGH (ref 1.7–7.7)
Platelets: 291 10*3/uL (ref 150–400)
RBC: 5.08 MIL/uL (ref 3.87–5.11)
RDW: 13.7 % (ref 11.5–15.5)
WBC: 14.3 10*3/uL — ABNORMAL HIGH (ref 4.0–10.5)

## 2014-02-11 LAB — LIPID PANEL
Cholesterol: 221 mg/dL — ABNORMAL HIGH (ref 0–200)
HDL: 70 mg/dL (ref >39)
LDL Cholesterol: 128 mg/dL — ABNORMAL HIGH (ref 0–99)
TRIGLYCERIDES: 116 mg/dL (ref <150)
Total CHOL/HDL Ratio: 3.2 Ratio
VLDL: 23 mg/dL (ref 0–40)

## 2014-02-11 LAB — HEMOGLOBIN A1C
HEMOGLOBIN A1C: 5.8 % — AB (ref <5.7)
Mean Plasma Glucose: 120 mg/dL — ABNORMAL HIGH (ref <117)

## 2014-02-12 NOTE — Assessment & Plan Note (Signed)
She continues to smoke and is not ready to quit

## 2014-02-12 NOTE — Assessment & Plan Note (Signed)
Bone density will be set up in April she will continue the Fosamax at this time and her calcium and vitamin D

## 2014-02-12 NOTE — Assessment & Plan Note (Signed)
Resolving exacerbation continue current medications

## 2014-02-12 NOTE — Progress Notes (Signed)
Patient ID: Cindy [name], female   DOB: 03/10/1956, 58 y.o.   MRN: [id number]   Subjective:    Patient ID: Cindy [name], female    DOB: [birth year], 58 y.o.   MRN: [id number]  Patient presents for 3 mos follow up  patient here to follow chronic medical problems. She has no specific concerns today. She was treated for COPD exacerbation last week and is currently recovering from that. She's wearing 2 L of oxygen and is using her nebulizer as prescribed. She states her breathing is much better today. She's due for repeat fasting labs for her hyperlipidemia and her liver function tests.  She is due for physical exam with Pap smear in April as well as bone density for her osteoporosis.    Review Of Systems:  GEN- denies fatigue, fever, weight loss,weakness, recent illness HEENT- denies eye drainage, change in vision, nasal discharge, CVS- denies chest pain, palpitations RESP- denies SOB, +cough, +wheeze ABD- denies N/V, change in stools, abd pain MSK- + joint pain, denies muscle aches, injury Neuro- denies headache, dizziness, syncope, seizure activity       Objective:    BP 120/82  Pulse 94  Temp(Src) 98.2 F (36.8 C) (Oral)  Resp 18  Ht 5' 0.5" (1.537 m)  Wt 150 lb (68.04 kg)  BMI 28.80 kg/m2  SpO2 98% GEN- NAD, alert and oriented x3 HEENT- PERRL, EOMI, non injected sclera, pink conjunctiva, MMM, oropharynx clear Neck- Supple, no LAD CVS- RRR, no murmur RESP-scattered wheeze, bilat, good air movement, no rhonchi, normal WOB EXT- No edema Pulses- Radial 2+        Assessment & Plan:      Problem List Items Addressed This Visit   Tobacco abuse (Chronic)     She continues to smoke and is not ready to quit    Osteoporosis     Bone density will be set up in April she will continue the Fosamax at this time and her calcium and vitamin D    Hyperlipidemia - Primary     Plan to recheck fasting lipid panel she's currently on Zocor 20 mg    Relevant Orders      Lipid  Panel (Completed)      Comprehensive metabolic panel (Completed)      CBC with Differential (Completed)   Hyperglycemia, drug-induced   Relevant Orders      Hemoglobin A1c (Completed)   C O P D     Resolving exacerbation continue current medications       Note: This dictation was prepared with Dragon dictation along with smaller phrase technology. Any transcriptional errors that result from this process are unintentional.

## 2014-02-12 NOTE — Assessment & Plan Note (Signed)
Plan to recheck fasting lipid panel she's currently on Zocor 20 mg

## 2014-02-16 ENCOUNTER — Telehealth: Payer: Self-pay | Admitting: *Deleted

## 2014-02-16 ENCOUNTER — Ambulatory Visit: Payer: Medicaid Other | Admitting: Emergency Medicine

## 2014-02-16 NOTE — Telephone Encounter (Signed)
Pt states she is having a cough and stated that you had mentioned something about a cough medicine you were going to call in if she needed it, says she is congested as well.

## 2014-02-17 ENCOUNTER — Telehealth: Payer: Self-pay | Admitting: *Deleted

## 2014-02-17 MED ORDER — GUAIFENESIN-CODEINE 100-10 MG/5ML PO SYRP: 5.0000 mL | ORAL_SOLUTION | Freq: Three times a day (TID) | ORAL | Status: DC | PRN

## 2014-02-17 NOTE — Telephone Encounter (Signed)
Call in robitussin with codiene 51ml every 6 hours as needed, I am not sure if her medicare will cover this

## 2014-02-17 NOTE — Telephone Encounter (Signed)
This is the cough syrup she needs with codeine,otherwise we can send tessalon perrles 100mg  TID #20

## 2014-02-17 NOTE — Telephone Encounter (Signed)
RX called in. Pt aware

## 2014-02-17 NOTE — Telephone Encounter (Signed)
Medicare will not cover cough medicine costing 17dollars, wants to see if you can refill what she had the last time.

## 2014-02-20 NOTE — Telephone Encounter (Signed)
Pt aware of message

## 2014-03-09 ENCOUNTER — Encounter: Payer: Self-pay | Admitting: Physician Assistant

## 2014-03-09 ENCOUNTER — Ambulatory Visit (INDEPENDENT_AMBULATORY_CARE_PROVIDER_SITE_OTHER): Payer: Medicare Other | Admitting: Physician Assistant

## 2014-03-09 VITALS — BP 116/78 | HR 128 | Temp 98.8°F | Resp 24 | Wt 150.0 lb

## 2014-03-09 DIAGNOSIS — F17200 Nicotine dependence, unspecified, uncomplicated: Secondary | ICD-10-CM | POA: Diagnosis not present

## 2014-03-09 DIAGNOSIS — J441 Chronic obstructive pulmonary disease with (acute) exacerbation: Secondary | ICD-10-CM | POA: Diagnosis not present

## 2014-03-09 DIAGNOSIS — Z720 Tobacco use: Secondary | ICD-10-CM

## 2014-03-09 MED ORDER — METHYLPREDNISOLONE ACETATE 80 MG/ML IJ SUSP
80.0000 mg | Freq: Once | INTRAMUSCULAR | Status: AC
  Administered 2014-03-09: 80 mg via INTRAMUSCULAR

## 2014-03-09 MED ORDER — AZITHROMYCIN 250 MG PO TABS: ORAL_TABLET | ORAL | Status: DC

## 2014-03-09 MED ORDER — IPRATROPIUM-ALBUTEROL 0.5-2.5 (3) MG/3ML IN SOLN
3.0000 mL | Freq: Once | RESPIRATORY_TRACT | Status: AC
  Administered 2014-03-09: 3 mL via RESPIRATORY_TRACT

## 2014-03-09 MED ORDER — PREDNISONE 20 MG PO TABS: ORAL_TABLET | ORAL | Status: DC

## 2014-03-10 NOTE — Progress Notes (Signed)
Patient ID: Cindy [name] MRN: [id number], DOB: April 06, 1956, 58 y.o. Date of Encounter: @DATE @ 03/09/14  Chief Complaint:  Chief Complaint  Patient presents with  . diff breathing    congestion, green secretions, chiils    HPI: 58 y.o. year old female  history of COPD and ongoing tobacco use presents with complaints of shortness of breath/difficulty breathing. Also is coughing up thick dark phlegm. She reports that symptoms started Tuesday night which was 03/07/14. Tuesday night she developed a cough. If it is progressively worsened since then. Says today it got so bad "she had no choice but to come to treatment." Says that she feels as if she cannot get enough air. Is currently complaining of feeling symptoms of chills but has had no documented fever. No head or nasal congestion/mucus. No sore throat no ear ache.   Past Medical History  Diagnosis Date  . COPD (chronic obstructive pulmonary disease)   . Shortness of breath   . Compression fracture of lumbar vertebra   . Hyperglycemia, drug-induced   . Hyperlipidemia   . Osteoporosis 2013  . Tobacco abuse   . Chronic respiratory failure On home 02 2-3L  . Thyroid disease     pt denies  . Adrenal adenoma 2014    Left  . Kidney stones 2014    Left side, multiple     Home Meds: See attached medication section for current medication list. Any medications entered into computer today will not appear on this note's list. The medications listed below were entered prior to today. Current Outpatient Prescriptions on File Prior to Visit  Medication Sig Dispense Refill  . acetaminophen-codeine 120-12 MG/5ML solution Take 5 mLs by mouth every 6 (six) hours as needed for pain.  120 mL  1  . albuterol (PROVENTIL HFA;VENTOLIN HFA) 108 (90 BASE) MCG/ACT inhaler Inhale 2 puffs into the lungs every 4 (four) hours as needed for wheezing.  1 Inhaler  6  . albuterol (PROVENTIL) (2.5 MG/3ML) 0.083% nebulizer solution Take 3 mLs (2.5 mg total) by  nebulization every 4 (four) hours as needed for wheezing or shortness of breath.  75 mL  11  . alendronate (FOSAMAX) 70 MG tablet Take 1 tablet (70 mg total) by mouth every 7 (seven) days. Take with a full glass of water on an empty stomach.(wednesday)  4 tablet  6  . aspirin EC 81 MG tablet Take 81 mg by mouth every morning.       . budesonide-formoterol (SYMBICORT) 160-4.5 MCG/ACT inhaler Inhale 2 puffs into the lungs 2 (two) times daily.  1 Inhaler  11  . Calcium Carbonate-Vitamin D (CALCIUM 600/VITAMIN D) 600-400 MG-UNIT per tablet Take 1 tablet by mouth 2 (two) times daily.      . clotrimazole (LOTRIMIN) 1 % cream Apply 1 application topically 2 (two) times daily.  30 g  0  . guaiFENesin-codeine (ROBITUSSIN AC) 100-10 MG/5ML syrup Take 5 mLs by mouth 3 (three) times daily as needed for cough.  120 mL  0  . Multiple Vitamin (MULITIVITAMIN WITH MINERALS) TABS Take 1 tablet by mouth every morning.       . nystatin (MYCOSTATIN) 100000 UNIT/ML suspension Take 5 mLs (500,000 Units total) by mouth 4 (four) times daily.  120 mL  1  . oxyCODONE-acetaminophen (PERCOCET/ROXICET) 5-325 MG per tablet Take 1 tablet by mouth every 4 (four) hours as needed for pain.      . pantoprazole (PROTONIX) 40 MG tablet take 1 tablet by mouth once daily  90 tablet  1  . predniSONE (DELTASONE) 10 MG tablet Take 60mg  by mouth x 3 days, then 40mg  x 3 days, then 20mg  x 3 days, then 10 days x3  39 tablet  0  . simvastatin (ZOCOR) 20 MG tablet Take 1 tablet (20 mg total) by mouth every evening.  90 tablet  1  . tiotropium (SPIRIVA) 18 MCG inhalation capsule Place 1 capsule (18 mcg total) into inhaler and inhale every evening. inhale contents of 1 capsule by mouth once daily  30 capsule  11   No current facility-administered medications on file prior to visit.    Allergies:  Allergies  Allergen Reactions  . Keflex [Cephalexin] Anaphylaxis and Rash  . Doxycycline Other (See Comments)    Gave patient oral thrush  . Sulfa  Antibiotics   . Avelox [Moxifloxacin Hcl In Nacl] Itching and Rash  . Toradol [Ketorolac Tromethamine] Swelling, Rash and Other (See Comments)    Bruising, swelling, rash at injection site    History   Social History  . Marital Status: Divorced    Spouse Name: N/A    Number of Children: 3  . Years of Education: N/A   Occupational History  . disabled    Social History Main Topics  . Smoking status: Current Every Day Smoker -- 1.00 packs/day for 25 years    Types: Cigarettes  . Smokeless tobacco: Never Used  . Alcohol Use: No  . Drug Use: No  . Sexual Activity: No   Other Topics Concern  . Not on file   Social History Narrative  . No narrative on file    Family History  Problem Relation Age of Onset  . Heart disease Mother   . Hypertension Sister   . Hypertension Brother      Review of Systems:  See HPI for pertinent ROS. All other ROS negative.    Physical Exam: Blood pressure 116/78, pulse 128, temperature 98.8 F (37.1 C), temperature source Oral, resp. rate 24, weight 150 lb (68.04 kg), SpO2 89.00%., Body mass index is 28.8 kg/(m^2). Initial oxygen saturation on 2 L nasal cannula oxygen was 89%. Repeat oxygen saturation after the DuoNeb but still on a 2 L nasal cannula oxygen was 99% General: WNWD WF. Appears in no acute distress. Head: Normocephalic, atraumatic, eyes without discharge, sclera non-icteric, nares are without discharge. Bilateral auditory canals clear, TM's are without perforation, pearly grey and translucent with reflective cone of light bilaterally. Oral cavity moist, posterior pharynx without exudate, erythema, peritonsillar abscess.  Neck: Supple. No thyromegaly. No lymphadenopathy. Lungs:  Initial exam: Very decreased air movement throughout. Only a slight high pitched wheeze is heard. Repeat exam after the duoNeb: Much improved air movement and increased breath sounds. Very slight wheeze heard.  Heart: RRR with S1 S2. No murmurs, rubs, or  gallops. Musculoskeletal:  Strength and tone normal for age. Extremities/Skin: Warm and dry.  Neuro: Alert and oriented X 3. Moves all extremities spontaneously. Gait is normal. CNII-XII grossly in tact. Psych:  Responds to questions appropriately with a normal affect.     ASSESSMENT AND PLAN:  58 y.o. year old female with  1. COPD exacerbation - ipratropium-albuterol (DUONEB) 0.5-2.5 (3) MG/3ML nebulizer solution 3 mL; Take 3 mLs by nebulization once. - methylPREDNISolone acetate (DEPO-MEDROL) injection 80 mg; Inject 1 mL (80 mg total) into the muscle once. - predniSONE (DELTASONE) 20 MG tablet; Take 3 daily for 2 days, then 2 daily for 2 days, then 1 daily for 2 days.  Dispense:  12 tablet; Refill: 0 - azithromycin (ZITHROMAX) 250 MG tablet; Day 1: Take 2 daily.  Days 2-5: Take 1 daily.  Dispense: 6 tablet; Refill: 0   2. Tobacco abuse  She was given DuoNeb nebulizer treatment during the office visit Also given the Depo-Medrol 80 mg IM at the visit See exam findings above regarding improvement in her exam and improvement in oxygen saturations. She is to start the azithromycin today and take as directed. She is to start the oral prednisone taper tomorrow. I recommended that she schedule a followup visit here tomorrow. However she says that she will not schedule it now but instead will follow up if worsens or not improved. She has been counseled regarding smoking cessation repeatedly. Specifically she has seen pulmonary in the past and has recently seen Dr. Buelah Manis with COPD exacerbation.   Marin Olp [place], [place], [facility] 03/10/2014 8:49 AM

## 2014-03-14 ENCOUNTER — Ambulatory Visit (INDEPENDENT_AMBULATORY_CARE_PROVIDER_SITE_OTHER): Payer: Medicare Other | Admitting: Emergency Medicine

## 2014-03-14 ENCOUNTER — Encounter: Payer: Self-pay | Admitting: Emergency Medicine

## 2014-03-14 VITALS — BP 130/84 | HR 114 | Ht 62.0 in | Wt 155.0 lb

## 2014-03-14 DIAGNOSIS — J449 Chronic obstructive pulmonary disease, unspecified: Secondary | ICD-10-CM

## 2014-03-14 DIAGNOSIS — F17200 Nicotine dependence, unspecified, uncomplicated: Secondary | ICD-10-CM

## 2014-03-14 DIAGNOSIS — Z720 Tobacco use: Secondary | ICD-10-CM

## 2014-03-14 NOTE — Assessment & Plan Note (Signed)
Discussed cessation today. 

## 2014-03-14 NOTE — Assessment & Plan Note (Signed)
-   discussed smoking in detail today. Not ready to officially set goal of 15cig/day - same BD's - full PFT - rov 2

## 2014-03-14 NOTE — Progress Notes (Signed)
Subjective:    Patient ID: Cindy [name], female    DOB: March 07, 1956, 58 y.o.   MRN: [id number]  HPI 58 yo woman, smoker (40pk-yrs), hyperlipidemia. Followed by Dr Buelah Manis and on Spiriva + Symbicort + ProAir prn, FEV1 19% predicted on prior PFT! She has had several exacerbations over the last year. Was first noted to be hypoxemic in 2010, is on O2 that she is using prn.  She has exertional wheeze, SOB. Coughs daily - productive white, thick.   ROV 09/27/12 -- follows up COPD, hypoxemia, on Spiriva/Symbicort. 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 Since last visit has been by Dr Buelah Manis and in the ED, treated with pred for chest tightness.  Uses albuterol every 6 hours. Hasn;t had a-1 AT yet to my knowledge.   ROV 12/01/12 -- COPD + hypoxemia, severe AFL. Returns for f/u. Continues to smoke 0.5-1.0 pk/day. About last 3 weeks, URI sx, nasal congestion, sore throat. Was treated by Dr Buelah Manis with augmentin and prednisone. Remains on Spiriva + Symbicort.   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 Has been seen in the ED and by Dr Buelah Manis several times since our last visit. She is finishing augmentin + pred now to treat recent AE + bronchitic sx. She continues to smoke > 1pk/day. Using SABA a few times a week. Improved cough, green sputum.   ROV 02/22/13 -- severe COPD, frequent AE's, hypoxemia. Active tobacco use. We started daliresp last time. She reports that she is doing OK, she was treated with abx/pred before our last visit 1/17. She reports constipation since last visit, ? From daliresp. She is smoking >1pk/day. She is asking for prednisone for her back pain, constipation.   ROV 04/22/13 -- Active smoker, Severe COPD, frequent AE's most recently in March (since our last appt), ? Due to allergies. Finished pred, started fluticasone. She is dealing with pain from compression fracture. Remains on symbicort and spiriva. She has also recently been  found to have L adrenal adenoma > to be seen by urology, [place].   ROV 06/14/13 -- Active smoker, Severe COPD, frequent AE's, allergic rhinitis. She was admitted for N/V, ? Renal stone/UTI, a COPD exac 6/1-6/2. She is feeling better, no evidence exacerbation right now. She is on Symbicort, Spiriva.   ROV 08/25/13 -- Active smoker, Severe COPD, frequent AE's, allergic rhinitis. She c/o worsening over the last week >  Having radicular LE pain, she has had more chest tightness this week and SOB. She saw Dr Buelah Manis last week and has been treated with augmentin+ pred taper. She has improved after rx for AE.   ROV 10/27/13 -- Active smoker, Severe COPD, frequent AE's, allergic rhinitis. Here for f/u. She tells me that she feels very well, just treated with prednisone + azithro for nasal congestion > finally improved. On Spiriva + Symbicort. Wears her O2 with some exertion but not all. Smoking 1 pk a day.   ROV 12/16/13 -- Active smoker > 1 pack/day, Severe COPD, frequent AE's, allergic rhinitis. She presents today for f/u. She developed some chest tightness, increased nasal congestion, cough with clear phlegm, possible chills, no documented fevers. She saw Dr Buelah Manis and was treated for an AE with solumedrol, prednisone and augmentin.   ROV 03/14/14 -- severe COPD, continues to smoke, allergies, freq AE's. She is currently being treated with pred + azithro, finishing today. This was her first flare since 12/16/13.  Still smokes a pack a  day. Discussed in detail today. She is willing to try to cut down. We talked about strategies today.    PULMONARY FUNCTON TEST 09/27/2012  FVC 2.31  FEV1 .87  FEV1/FVC 37.7  FVC  % Predicted 76  FEV % Predicted 38  FeF 25-75 .3  FeF 25-75 % Predicted 2.65       Objective:   Physical Exam Filed Vitals:   03/14/14 1012  BP: 130/84  Pulse: 114  Height: 5\' 2"  (1.575 m)  Weight: 155 lb (70.308 kg)  SpO2: 92%   Gen: Pleasant, well-nourished, in no distress, very  tangential, difficult history giver  ENT: No lesions,  mouth clear,  oropharynx clear, no postnasal drip  Neck: No JVD, no TMG, no carotid bruits, some mild UA exp noise  Lungs: No use of accessory muscles, B rhonchi, soft B exp wheezes.   Cardiovascular: RRR, heart sounds normal, no murmur or gallops, no peripheral edema  Musculoskeletal: No deformities, no cyanosis or clubbing  Neuro: alert, non focal  Skin: Warm, no lesions or rashes     Assessment & Plan:  C O P D - discussed smoking in detail today. Not ready to officially set goal of 15cig/day - same BD's - full PFT - rov 2  Tobacco abuse Discussed cessation today

## 2014-03-14 NOTE — Patient Instructions (Signed)
Please continue your inhaled medications as you are taking them We will repeat your breathing testing next visit Work on decreasing your cigarettes!! Follow with Dr Lamonte Sakai in 2 months with full PFT on the same day

## 2014-03-31 ENCOUNTER — Telehealth: Payer: Self-pay | Admitting: Family Medicine

## 2014-03-31 DIAGNOSIS — E785 Hyperlipidemia, unspecified: Secondary | ICD-10-CM

## 2014-03-31 MED ORDER — SIMVASTATIN 40 MG PO TABS: 40.0000 mg | ORAL_TABLET | Freq: Every day | ORAL | Status: DC

## 2014-03-31 NOTE — Telephone Encounter (Signed)
Medication refilled per protocol. 

## 2014-04-11 ENCOUNTER — Ambulatory Visit (INDEPENDENT_AMBULATORY_CARE_PROVIDER_SITE_OTHER): Payer: Medicare Other | Admitting: Family Medicine

## 2014-04-11 ENCOUNTER — Encounter: Payer: Self-pay | Admitting: Family Medicine

## 2014-04-11 ENCOUNTER — Encounter: Payer: Self-pay | Admitting: *Deleted

## 2014-04-11 VITALS — BP 122/78 | HR 74 | Temp 98.1°F | Resp 20 | Ht 61.0 in | Wt 153.0 lb

## 2014-04-11 DIAGNOSIS — N760 Acute vaginitis: Secondary | ICD-10-CM

## 2014-04-11 DIAGNOSIS — Z1231 Encounter for screening mammogram for malignant neoplasm of breast: Secondary | ICD-10-CM

## 2014-04-11 DIAGNOSIS — J441 Chronic obstructive pulmonary disease with (acute) exacerbation: Secondary | ICD-10-CM

## 2014-04-11 DIAGNOSIS — Z124 Encounter for screening for malignant neoplasm of cervix: Secondary | ICD-10-CM | POA: Diagnosis not present

## 2014-04-11 DIAGNOSIS — R300 Dysuria: Secondary | ICD-10-CM | POA: Diagnosis not present

## 2014-04-11 DIAGNOSIS — Z0000 Encounter for general adult medical examination without abnormal findings: Secondary | ICD-10-CM | POA: Diagnosis not present

## 2014-04-11 DIAGNOSIS — M810 Age-related osteoporosis without current pathological fracture: Secondary | ICD-10-CM | POA: Diagnosis not present

## 2014-04-11 LAB — URINALYSIS, ROUTINE W REFLEX MICROSCOPIC
Bilirubin Urine: NEGATIVE
Glucose, UA: NEGATIVE mg/dL
KETONES UR: NEGATIVE mg/dL
Leukocytes, UA: NEGATIVE
NITRITE: NEGATIVE
PH: 7 (ref 5.0–8.0)
PROTEIN: NEGATIVE mg/dL
Specific Gravity, Urine: 1.015 (ref 1.005–1.030)
Urobilinogen, UA: 0.2 mg/dL (ref 0.0–1.0)

## 2014-04-11 LAB — URINALYSIS, MICROSCOPIC ONLY
CRYSTALS: NONE SEEN
Casts: NONE SEEN

## 2014-04-11 LAB — WET PREP FOR TRICH, YEAST, CLUE
CLUE CELLS WET PREP: NONE SEEN
Trich, Wet Prep: NONE SEEN
Yeast Wet Prep HPF POC: NONE SEEN

## 2014-04-11 MED ORDER — AMOXICILLIN-POT CLAVULANATE 875-125 MG PO TABS: 1.0000 | ORAL_TABLET | Freq: Two times a day (BID) | ORAL | Status: DC

## 2014-04-11 MED ORDER — PREDNISONE 10 MG PO TABS: ORAL_TABLET | ORAL | Status: DC

## 2014-04-11 MED ORDER — OXYCODONE-ACETAMINOPHEN 5-325 MG PO TABS: 1.0000 | ORAL_TABLET | ORAL | Status: DC | PRN

## 2014-04-11 NOTE — Assessment & Plan Note (Signed)
Start prednisone taper, we may have to consider low dow prednisone however, she has already had compression fractures

## 2014-04-11 NOTE — Patient Instructions (Signed)
I recommend eye visit once a year I recommend dental visit every 6 months Goal is to  Exercise 30 minutes 5 days a week We will send a letter with lab results  Pain medication refilled Start prednisone taper and Augmentin Use nebulizer every 4 hours as needed  Mammogram and Bone Density to be Set up F/U 3 months

## 2014-04-12 ENCOUNTER — Other Ambulatory Visit: Payer: Self-pay | Admitting: *Deleted

## 2014-04-12 ENCOUNTER — Telehealth: Payer: Self-pay | Admitting: *Deleted

## 2014-04-12 DIAGNOSIS — Z0000 Encounter for general adult medical examination without abnormal findings: Secondary | ICD-10-CM | POA: Insufficient documentation

## 2014-04-12 DIAGNOSIS — E785 Hyperlipidemia, unspecified: Secondary | ICD-10-CM

## 2014-04-12 LAB — PAP THINPREP ASCUS RFLX HPV RFLX TYPE

## 2014-04-12 MED ORDER — SIMVASTATIN 40 MG PO TABS: 40.0000 mg | ORAL_TABLET | Freq: Every day | ORAL | Status: DC

## 2014-04-12 MED ORDER — ALBUTEROL SULFATE (2.5 MG/3ML) 0.083% IN NEBU: 2.5000 mg | INHALATION_SOLUTION | RESPIRATORY_TRACT | Status: DC | PRN

## 2014-04-12 MED ORDER — TIOTROPIUM BROMIDE MONOHYDRATE 2.5 MCG/ACT IN AERS: 2.0000 | INHALATION_SPRAY | Freq: Every day | RESPIRATORY_TRACT | Status: DC

## 2014-04-12 MED ORDER — ALBUTEROL SULFATE HFA 108 (90 BASE) MCG/ACT IN AERS: 2.0000 | INHALATION_SPRAY | Freq: Four times a day (QID) | RESPIRATORY_TRACT | Status: DC | PRN

## 2014-04-12 MED ORDER — BUDESONIDE-FORMOTEROL FUMARATE 160-4.5 MCG/ACT IN AERO: 2.0000 | INHALATION_SPRAY | Freq: Two times a day (BID) | RESPIRATORY_TRACT | Status: DC

## 2014-04-12 MED ORDER — TIOTROPIUM BROMIDE MONOHYDRATE 18 MCG IN CAPS: 18.0000 ug | ORAL_CAPSULE | Freq: Every evening | RESPIRATORY_TRACT | Status: DC

## 2014-04-12 MED ORDER — PANTOPRAZOLE SODIUM 40 MG PO TBEC: DELAYED_RELEASE_TABLET | ORAL | Status: DC

## 2014-04-12 MED ORDER — ALENDRONATE SODIUM 70 MG PO TABS: 70.0000 mg | ORAL_TABLET | ORAL | Status: DC

## 2014-04-12 NOTE — Telephone Encounter (Signed)
Call placed to patient to notify of UA results.   Patient returned call and made aware that UA negative.   Reports that urinary frequency continues, and she feels like she need to reliever herself again immediately after voiding.   Advised to F/U with patient's urologist in regards to possible retention.   Verbalized understanding.

## 2014-04-12 NOTE — Progress Notes (Signed)
Patient ID: Cindy [name], female   DOB: 11-Jun-1956, 58 y.o.   MRN: [id number] Subjective:   Patient presents for Medicare Annual/Subsequent preventive examination.   Review Past Medical/Family/Social: - Per EMR  Pt here for CPE and PAP Smear, last PAP in 2013 normal. Due for Mammogram and Bone Density Immunizations UTD  Seen by pulmonary recently for her severe COPD,also had exacerbation treated a month ago, which she has not recovered from, still feels tight in her chest, and has thick yellow sputum coming up. Using oxygen during day  Dysuria past few days, no abdominal pain  Risk Factors  Current exercise habits: walks Dietary issues discussed: Yes  Cardiac risk factors: Smoking,Hyperlipidemia  Depression Screen  (Note: if answer to either of the following is "Yes", a more complete depression screening is indicated)  Over the past two weeks, have you felt down, depressed or hopeless? No Over the past two weeks, have you felt little interest or pleasure in doing things? No Have you lost interest or pleasure in daily life? No Do you often feel hopeless? No Do you cry easily over simple problems? No   Activities of Daily Living  In your present state of health, do you have any difficulty performing the following activities?:  Driving? No  Managing money? No  Feeding yourself? No  Getting from bed to chair? No  Climbing a flight of stairs? No  Preparing food and eating?: No  Bathing or showering? No  Getting dressed: No  Getting to the toilet? No  Using the toilet:No  Moving around from place to place: No  In the past year have you fallen or had a near fall?:No  Are you sexually active? No  Do you have more than one partner? No   Hearing Difficulties: No  Do you often ask people to speak up or repeat themselves? No  Do you experience ringing or noises in your ears? No Do you have difficulty understanding soft or whispered voices? No  Do you feel that you have a problem  with memory? No Do you often misplace items? No  Do you feel safe at home? Yes  Cognitive Testing  Alert? Yes Normal Appearance?Yes  Oriented to person? Yes Place? Yes  Time? Yes  Recall of three objects? Yes  Can perform simple calculations? Yes  Displays appropriate judgment?Yes  Can read the correct time from a watch face?Yes   List the Names of Other Physician/Practitioners you currently use:  Pulmonary, Urology   Screening Tests / Date Colonoscopy - UTD                    Zostavax - unable to afford Mammogram - due Influenza Vaccine - UTD Tetanus/tdap- unable to afford   ROS GEN- denies fatigue, fever, weight loss,weakness, recent illness HEENT- denies eye drainage, change in vision, nasal discharge, CVS- denies chest pain, palpitations RESP- + SOB, +cough, +wheeze ABD- denies N/V, change in stools, abd pain GU- denies dysuria, hematuria, dribbling, incontinence MSK- denies joint pain, muscle aches, injury Neuro- denies headache, dizziness, syncope, seizure activity  PE: GEN- NAD, alert and oriented x3 HEENT- PERRL, EOMI, non injected sclera, pink conjunctiva, MMM, oropharynx clear Neck- Supple, no LAD Breast- normal symmetry, no nipple inversion,no nipple drainage, no nodules or lumps felt Nodes- no axillary nodes CVS- RRR, no murmur RESP-scattered wheeze, mild rhonchi bilat, good air movement,  normal WOB ABD-NABS,soft,NT,ND GU- normal external genitalia, vaginal mucosa pink and moist, cervix visualized no growth,mild atrophy noted at os,  no blood form os, minimal thin clear discharge, no CMT, no ovarian masses, uterus normal size EXT- No edema Pulses- Radial 2+    Assessment:    Annual wellness medicare exam   Plan:    During the course of the visit the patient was educated and counseled about appropriate screening and preventive services including:  Screening mammography  Bone Density Diet review for nutrition referral? Yes ____ Not Indicated __x__   Patient Instructions (the written plan) was given to the patient.  Medicare Attestation  I have personally reviewed:  The patient's medical and social history  Their use of alcohol, tobacco or illicit drugs  Their current medications and supplements  The patient's functional ability including ADLs,fall risks, home safety risks, cognitive, and hearing and visual impairment  Diet and physical activities  Evidence for depression or mood disorders  The patient's weight, height, BMI, and visual acuity have been recorded in the chart. I have made referrals, counseling, and provided education to the patient based on review of the above and I have provided the patient with a written personalized care plan for preventive services.

## 2014-04-12 NOTE — Assessment & Plan Note (Signed)
Bone Density on fosamax, calcium Vit D

## 2014-04-12 NOTE — Telephone Encounter (Signed)
Received fax from pharmacy requesting to change Spiriva from handi-haler to respimat.   MD made aware and approved.   Prescription sent to pharmacy.

## 2014-04-12 NOTE — Assessment & Plan Note (Signed)
PAP Smear done,  Mammogram

## 2014-04-12 NOTE — Telephone Encounter (Signed)
Per orders from MD, all medications refilled.

## 2014-04-13 ENCOUNTER — Telehealth: Payer: Self-pay | Admitting: Family Medicine

## 2014-04-13 NOTE — Telephone Encounter (Signed)
PT is calling because she is having some type of pressure with the simvastatin (ZOCOR) 40 MG tablet and not being able to sleep she is stopping that medication.  Call back number is [phone]

## 2014-04-14 ENCOUNTER — Encounter: Payer: Self-pay | Admitting: *Deleted

## 2014-04-14 NOTE — Telephone Encounter (Signed)
Call returned to patient.   Reports that she increased Zocor to 40mg  on Sunday.   States that she noted increased pressure in lower abdomen, causing increased urinary frequency and pain.   States she is no longer going to take Zocor. Attempted to advise that Zocor should not cause urinary urgency or frequency. Reported that she knows her body and knows Zocor caused it.   MD to be made aware.

## 2014-04-14 NOTE — Telephone Encounter (Signed)
Noted, she is holing zocor to see if this helps symptoms I agree should not cause any retention She has urology she can f/u with, which nurse has already instructed her

## 2014-04-15 ENCOUNTER — Emergency Department (HOSPITAL_COMMUNITY)
Admission: EM | Admit: 2014-04-15 | Discharge: 2014-04-15 | Disposition: A | Discharge status: Home / Self Care | Payer: Medicare Other | Attending: Emergency Medicine | Admitting: Emergency Medicine

## 2014-04-15 ENCOUNTER — Encounter (HOSPITAL_COMMUNITY): Payer: Self-pay | Admitting: Emergency Medicine

## 2014-04-15 ENCOUNTER — Emergency Department (HOSPITAL_COMMUNITY): Payer: Medicare Other

## 2014-04-15 DIAGNOSIS — J9610 Chronic respiratory failure, unspecified whether with hypoxia or hypercapnia: Secondary | ICD-10-CM | POA: Diagnosis not present

## 2014-04-15 DIAGNOSIS — IMO0002 Reserved for concepts with insufficient information to code with codable children: Secondary | ICD-10-CM | POA: Insufficient documentation

## 2014-04-15 DIAGNOSIS — Z8639 Personal history of other endocrine, nutritional and metabolic disease: Secondary | ICD-10-CM | POA: Insufficient documentation

## 2014-04-15 DIAGNOSIS — Z7982 Long term (current) use of aspirin: Secondary | ICD-10-CM | POA: Diagnosis not present

## 2014-04-15 DIAGNOSIS — J4489 Other specified chronic obstructive pulmonary disease: Secondary | ICD-10-CM | POA: Insufficient documentation

## 2014-04-15 DIAGNOSIS — N200 Calculus of kidney: Secondary | ICD-10-CM | POA: Diagnosis not present

## 2014-04-15 DIAGNOSIS — D3500 Benign neoplasm of unspecified adrenal gland: Secondary | ICD-10-CM | POA: Diagnosis not present

## 2014-04-15 DIAGNOSIS — J449 Chronic obstructive pulmonary disease, unspecified: Secondary | ICD-10-CM | POA: Diagnosis not present

## 2014-04-15 DIAGNOSIS — N949 Unspecified condition associated with female genital organs and menstrual cycle: Secondary | ICD-10-CM | POA: Diagnosis not present

## 2014-04-15 DIAGNOSIS — M810 Age-related osteoporosis without current pathological fracture: Secondary | ICD-10-CM | POA: Diagnosis not present

## 2014-04-15 DIAGNOSIS — N210 Calculus in bladder: Secondary | ICD-10-CM | POA: Diagnosis not present

## 2014-04-15 DIAGNOSIS — Z862 Personal history of diseases of the blood and blood-forming organs and certain disorders involving the immune mechanism: Secondary | ICD-10-CM | POA: Insufficient documentation

## 2014-04-15 DIAGNOSIS — F17200 Nicotine dependence, unspecified, uncomplicated: Secondary | ICD-10-CM | POA: Insufficient documentation

## 2014-04-15 DIAGNOSIS — E785 Hyperlipidemia, unspecified: Secondary | ICD-10-CM | POA: Insufficient documentation

## 2014-04-15 DIAGNOSIS — Z79899 Other long term (current) drug therapy: Secondary | ICD-10-CM | POA: Diagnosis not present

## 2014-04-15 LAB — URINALYSIS, ROUTINE W REFLEX MICROSCOPIC
Bilirubin Urine: NEGATIVE
GLUCOSE, UA: NEGATIVE mg/dL
Ketones, ur: NEGATIVE mg/dL
LEUKOCYTES UA: NEGATIVE
Nitrite: NEGATIVE
PH: 6.5 (ref 5.0–8.0)
Protein, ur: NEGATIVE mg/dL
Urobilinogen, UA: 0.2 mg/dL (ref 0.0–1.0)

## 2014-04-15 LAB — URINE MICROSCOPIC-ADD ON

## 2014-04-15 MED ORDER — PHENAZOPYRIDINE HCL 100 MG PO TABS
200.0000 mg | ORAL_TABLET | Freq: Once | ORAL | Status: AC
  Administered 2014-04-15: 200 mg via ORAL
  Filled 2014-04-15: qty 2

## 2014-04-15 MED ORDER — NITROFURANTOIN MONOHYD MACRO 100 MG PO CAPS: 100.0000 mg | ORAL_CAPSULE | Freq: Two times a day (BID) | ORAL | Status: DC

## 2014-04-15 MED ORDER — MORPHINE SULFATE 10 MG/ML IJ SOLN
10.0000 mg | Freq: Once | INTRAMUSCULAR | Status: AC
  Administered 2014-04-15: 10 mg via INTRAMUSCULAR
  Filled 2014-04-15: qty 1

## 2014-04-15 MED ORDER — ONDANSETRON 8 MG PO TBDP
8.0000 mg | ORAL_TABLET | Freq: Once | ORAL | Status: AC
  Administered 2014-04-15: 8 mg via ORAL
  Filled 2014-04-15: qty 1

## 2014-04-15 MED ORDER — NITROFURANTOIN MONOHYD MACRO 100 MG PO CAPS
100.0000 mg | ORAL_CAPSULE | Freq: Two times a day (BID) | ORAL | Status: DC
  Administered 2014-04-15: 100 mg via ORAL
  Filled 2014-04-15: qty 1

## 2014-04-15 NOTE — ED Notes (Signed)
Pt c/o urinary urgency but sometimes is unable to urinate. Pt c/o lower back pain and pressure in her vagina. Pt is uanble to sleep.

## 2014-04-15 NOTE — ED Provider Notes (Signed)
CSN: [id number]     Arrival date & time 04/15/14  1926 History   First MD Initiated Contact with Patient 04/15/14 1935     Chief Complaint  Patient presents with  . Urinary Frequency     (Consider location/radiation/quality/duration/timing/severity/associated sxs/prior Treatment) HPI Comments: Patient presents to the ER for evaluation of lower abdominal/pelvic pressure , urinary urgency, urinary frequency, moderate to severe dysuria. She has not had fever, nausea, vomiting or back pain.  Patient is a 58 y.o. female presenting with frequency.  Urinary Frequency    Past Medical History  Diagnosis Date  . COPD (chronic obstructive pulmonary disease)   . Shortness of breath   . Compression fracture of lumbar vertebra   . Hyperglycemia, drug-induced   . Hyperlipidemia   . Osteoporosis 2013  . Tobacco abuse   . Chronic respiratory failure On home 02 2-3L  . Thyroid disease     pt denies  . Adrenal adenoma 2014    Left  . Kidney stones 2014    Left side, multiple   Past Surgical History  Procedure Laterality Date  . Tubal ligation    . Cervical biopsy    . Colonoscopy N/A 03/31/2013    Procedure: COLONOSCOPY;  Surgeon: Rogene Houston, MD;  Location: [facility];  Service: Endoscopy;  Laterality: N/A;  730   Family History  Problem Relation Age of Onset  . Heart disease Mother   . Hypertension Sister   . Hypertension Brother    History  Substance Use Topics  . Smoking status: Current Every Day Smoker -- 1.00 packs/day for 25 years    Types: Cigarettes  . Smokeless tobacco: Never Used  . Alcohol Use: No   OB History   Grav Para Term Preterm Abortions TAB SAB Ect Mult Living                 Review of Systems  Genitourinary: Positive for dysuria, urgency and frequency.  All other systems reviewed and are negative.     Allergies  Keflex; Doxycycline; Sulfa antibiotics; Avelox; and Toradol  Home Medications   Prior to Admission medications   Medication Sig  Start Date End Date Taking? Authorizing Provider  albuterol (PROAIR HFA) 108 (90 BASE) MCG/ACT inhaler Inhale 2 puffs into the lungs every 6 (six) hours as needed for wheezing or shortness of breath. 04/12/14   Alycia Rossetti, MD  albuterol (PROVENTIL) (2.5 MG/3ML) 0.083% nebulizer solution Take 3 mLs (2.5 mg total) by nebulization every 4 (four) hours as needed for wheezing or shortness of breath. 04/12/14 04/12/15  Alycia Rossetti, MD  alendronate (FOSAMAX) 70 MG tablet Take 1 tablet (70 mg total) by mouth every 7 (seven) days. Take with a full glass of water on an empty stomach.Marlana Latus) 04/12/14 04/12/15  Alycia Rossetti, MD  amoxicillin-clavulanate (AUGMENTIN) 875-125 MG per tablet Take 1 tablet by mouth 2 (two) times daily. 04/11/14   Alycia Rossetti, MD  aspirin EC 81 MG tablet Take 81 mg by mouth every morning.     Historical Provider, MD  budesonide-formoterol (SYMBICORT) 160-4.5 MCG/ACT inhaler Inhale 2 puffs into the lungs 2 (two) times daily. 04/12/14 04/12/15  Alycia Rossetti, MD  Calcium Carbonate-Vitamin D (CALCIUM 600/VITAMIN D) 600-400 MG-UNIT per tablet Take 1 tablet by mouth 2 (two) times daily.    Historical Provider, MD  guaiFENesin-codeine (ROBITUSSIN AC) 100-10 MG/5ML syrup Take 5 mLs by mouth 3 (three) times daily as needed for cough. 02/17/14   Alycia Rossetti,  MD  Multiple Vitamin (MULITIVITAMIN WITH MINERALS) TABS Take 1 tablet by mouth every morning.     Historical Provider, MD  nystatin (MYCOSTATIN) 100000 UNIT/ML suspension Take 5 mLs (500,000 Units total) by mouth 4 (four) times daily. 11/02/13   Alycia Rossetti, MD  oxyCODONE-acetaminophen (PERCOCET/ROXICET) 5-325 MG per tablet Take 1 tablet by mouth every 4 (four) hours as needed. 04/11/14   Alycia Rossetti, MD  pantoprazole (PROTONIX) 40 MG tablet take 1 tablet by mouth once daily 04/12/14   Alycia Rossetti, MD  predniSONE (DELTASONE) 10 MG tablet Take 40mg  x 3 days,20mg  x 3 days, 10mg  x 3 days 04/11/14   Alycia Rossetti, MD  simvastatin (ZOCOR) 40 MG tablet Take 1 tablet (40 mg total) by mouth at bedtime. 04/12/14   Alycia Rossetti, MD  Tiotropium Bromide Monohydrate (SPIRIVA RESPIMAT) 2.5 MCG/ACT AERS Inhale 2 sprays into the lungs daily. 04/12/14   Alycia Rossetti, MD   BP 153/88  Pulse 114  Temp(Src) 97.9 F (36.6 C) (Oral)  Resp 22  Ht 5\' 1"  (1.549 m)  Wt 153 lb (69.4 kg)  BMI 28.92 kg/m2  SpO2 99% Physical Exam  Constitutional: She is oriented to person, place, and time. She appears well-developed and well-nourished. No distress.  HENT:  Head: Normocephalic and atraumatic.  Right Ear: Hearing normal.  Left Ear: Hearing normal.  Nose: Nose normal.  Mouth/Throat: Oropharynx is clear and moist and mucous membranes are normal.  Eyes: Conjunctivae and EOM are normal. Pupils are equal, round, and reactive to light.  Neck: Normal range of motion. Neck supple.  Cardiovascular: Regular rhythm, S1 normal and S2 normal.  Exam reveals no gallop and no friction rub.   No murmur heard. Pulmonary/Chest: Effort normal and breath sounds normal. No respiratory distress. She exhibits no tenderness.  Abdominal: Soft. Normal appearance and bowel sounds are normal. There is no hepatosplenomegaly. There is no tenderness. There is no rebound, no guarding, no tenderness at McBurney's point and negative Murphy's sign. No hernia.  Musculoskeletal: Normal range of motion.  Neurological: She is alert and oriented to person, place, and time. She has normal strength. No cranial nerve deficit or sensory deficit. Coordination normal. GCS eye subscore is 4. GCS verbal subscore is 5. GCS motor subscore is 6.  Skin: Skin is warm, dry and intact. No rash noted. No cyanosis.  Psychiatric: She has a normal mood and affect. Her speech is normal and behavior is normal. Thought content normal.    ED Course  Procedures (including critical care time) Labs Review Labs Reviewed  URINALYSIS, ROUTINE W REFLEX MICROSCOPIC     Imaging Review No results found.   EKG Interpretation None      MDM   Final diagnoses:  None    Patient presents to the ER for evaluation of urinary urgency and some evidence of dysuria. She has low abdominal pelvic pain as well. The patient reports a previous history of kidney stones, but this felt different. Her symptoms seemed to suggest possible infection, but urinalysis did not suggest this. CT scan was therefore performed an additional 3 mm stone in the bladder, likely a recently passed stone Symptoms. Will be a prescription for Macrobid to ensure that there is no infection and analgesia, follow up with primary doctor.   Orpah Greek, MD 04/15/14 2107

## 2014-04-15 NOTE — Discharge Instructions (Signed)
Kidney Stones Kidney stones (urolithiasis) are solid masses that form inside your kidneys. The intense pain is caused by the stone moving through the kidney, ureter, bladder, and urethra (urinary tract). When the stone moves, the ureter starts to spasm around the stone. The stone is usually passed in your pee (urine).  HOME CARE  Drink enough fluids to keep your pee clear or pale yellow. This helps to get the stone out.  Strain all pee through the provided strainer. Do not pee without peeing through the strainer, not even once. If you pee the stone out, catch it in the strainer. The stone may be as small as a grain of salt. Take this to your doctor. This will help your doctor figure out what you can do to try to prevent more kidney stones.  Only take medicine as told by your doctor.  Follow up with your doctor as told.  Get follow-up X-rays as told by your doctor. GET HELP IF: You have pain that gets worse even if you have been taking pain medicine. GET HELP RIGHT AWAY IF:   Your pain does not get better with medicine.  You have a fever or shaking chills.  Your pain increases and gets worse over 18 hours.  You have new belly (abdominal) pain.  You feel faint or pass out.  You are unable to pee. MAKE SURE YOU:   Understand these instructions.  Will watch your condition.  Will get help right away if you are not doing well or get worse. Document Released: 06/02/2008 Document Revised: 08/17/2013 Document Reviewed: 05/18/2013 [employer] Patient Information 2014 [employer].  

## 2014-04-17 ENCOUNTER — Telehealth: Payer: Self-pay | Admitting: *Deleted

## 2014-04-17 NOTE — Telephone Encounter (Signed)
Call returned to patient.   States that she was seen in ER over weekend for kidney stone.   ER prescribed Macrobid. Patient states that she stopped Augmentin for URI when ER started new ABT.   MD made aware and advised to continue both ABT's and complete prednisone as ordered. F/U with urologist d/t kidney stone.   Patient verbalized understanding.

## 2014-04-17 NOTE — Telephone Encounter (Signed)
Message copied by Sheral Flow on Mon Apr 17, 2014  2:36 PM ------      Message from: Devoria Glassing      Created: Mon Apr 17, 2014 11:59 AM       Patient is calling us to let dr Buelah Manis know that she did not have a uti but instead she had a kidney stone and had to go to the hospital, she would like a call back from dr Buelah Manis at [phone] ------

## 2014-04-25 ENCOUNTER — Ambulatory Visit (HOSPITAL_COMMUNITY)
Admission: RE | Admit: 2014-04-25 | Discharge: 2014-04-25 | Disposition: A | Discharge status: Home / Self Care | Payer: Medicare Other | Source: Ambulatory Visit | Attending: Family Medicine | Admitting: Family Medicine

## 2014-04-25 DIAGNOSIS — Z1231 Encounter for screening mammogram for malignant neoplasm of breast: Secondary | ICD-10-CM

## 2014-05-08 ENCOUNTER — Ambulatory Visit (INDEPENDENT_AMBULATORY_CARE_PROVIDER_SITE_OTHER): Payer: Medicare Other | Admitting: Family Medicine

## 2014-05-08 ENCOUNTER — Encounter: Payer: Self-pay | Admitting: Family Medicine

## 2014-05-08 VITALS — BP 136/78 | HR 70 | Temp 98.0°F | Resp 18 | Ht 60.5 in | Wt 155.0 lb

## 2014-05-08 DIAGNOSIS — J441 Chronic obstructive pulmonary disease with (acute) exacerbation: Secondary | ICD-10-CM | POA: Diagnosis not present

## 2014-05-08 MED ORDER — AMOXICILLIN-POT CLAVULANATE 875-125 MG PO TABS: 1.0000 | ORAL_TABLET | Freq: Two times a day (BID) | ORAL | Status: DC

## 2014-05-08 MED ORDER — PREDNISONE 10 MG PO TABS: ORAL_TABLET | ORAL | Status: DC

## 2014-05-08 MED ORDER — METHYLPREDNISOLONE ACETATE 40 MG/ML IJ SUSP
40.0000 mg | Freq: Once | INTRAMUSCULAR | Status: AC
  Administered 2014-05-08: 40 mg via INTRAMUSCULAR

## 2014-05-08 NOTE — Assessment & Plan Note (Signed)
Acute excerbation, given depo medrol in office Prednisone taper, augmentin Mucinex On 2 L She has been maxed out on medication she did not do well with Daliresp. I query if she would do well which is low dose prednisone however I did review the side effects with her and she does not want to do this at this time is just treat exacerbations as they occur I do not see any other things that I can intervene with at this time. She does have followup with pulmonary next month for another pulmonary function tests. Unfortunately  she continues to smoke which increases her exacerbations.

## 2014-05-08 NOTE — Patient Instructions (Signed)
Start prednisone tomorrow  Depo Medrol Shot given Start antibiotics Get your nebulizer medicine F/U as previous

## 2014-05-08 NOTE — Progress Notes (Signed)
Patient ID: Cindy [name], female   DOB: 02/26/1956, 58 y.o.   MRN: [id number]   Subjective:    Patient ID: Cindy [name], female    DOB: [birth year], 58 y.o.   MRN: [id number]  Patient presents for breathing difficulties  Patient here with COPD exacerbation. She has chronic severe end-stage COPD. She's on oxygen therapy. She's had 2 episodes over the weekend where her breathing became very severe she felt like she was closed in her oxygen dropped to 83%. She's been coughing up sputum which is typical with her exacerbations. Of note she was treated in February as well as March for prolonged exacerbation and in the beginning of one in April. She was seen by her pulmonologist last in March of 2014 at this point she's been tried on multiple medications including bowel rest with no improvement in her symptoms and she continues to smoke. She was also treated in the interim in the ED for kidney stones.    Review Of Systems:  GEN- denies fatigue, fever, weight loss,weakness, recent illness HEENT- denies eye drainage, change in vision, nasal discharge, CVS- denies chest pain, palpitations RESP- +SOB,+ cough, +wheeze Neuro- denies headache, dizziness, syncope, seizure activity       Objective:    BP 136/78  Pulse 70  Temp(Src) 98 F (36.7 C) (Oral)  Resp 18  Ht 5' 0.5" (1.537 m)  Wt 155 lb (70.308 kg)  BMI 29.76 kg/m2  SpO2 96% GEN- NAD, alert and oriented x3 HEENT- PERRL, EOMI, non injected sclera, pink conjunctiva, MMM, oropharynx clear Neck- Supple, no LAD CVS- RRR, no murmur RESP-scattered wheeze, mild rhonchi bilat, good air movement,  Increased WOB EXT- No edema Pulses- Radial 2+        Assessment & Plan:      Problem List Items Addressed This Visit   COPD exacerbation - Primary     Acute excerbation, given depo medrol in office Prednisone taper, augmentin Mucinex On 2 L She has been maxed out on medication she did not do well with Daliresp. I query if she would do well  which is low dose prednisone however I did review the side effects with her and she does not want to do this at this time is just treat exacerbations as they occur I do not see any other things that I can intervene with at this time. She does have followup with pulmonary next month for another pulmonary function tests. Unfortunately  she continues to smoke which increases her exacerbations.     Relevant Medications      Tiotropium Bromide Monohydrate (SPIRIVA RESPIMAT) 2.5 MCG/ACT AERS      predniSONE (DELTASONE) tablet      methylPREDNISolone acetate (DEPO-MEDROL) injection 40 mg (Completed)      Note: This dictation was prepared with Dragon dictation along with smaller phrase technology. Any transcriptional errors that result from this process are unintentional.

## 2014-05-10 ENCOUNTER — Telehealth: Payer: Self-pay | Admitting: *Deleted

## 2014-05-10 NOTE — Telephone Encounter (Signed)
Okay to give normal results for mammogram  We do not do blood typing unless you are pregnant or have transfusion

## 2014-05-10 NOTE — Telephone Encounter (Signed)
Message copied by Sheral Flow on Wed May 10, 2014 12:14 PM ------      Message from: Lenore Manner      Created: Wed May 10, 2014 11:23 AM      Regarding: Mammogram       Contact: [phone]       Pt is wanting to ask a question about her mammogram and what is her blood type ------

## 2014-05-10 NOTE — Telephone Encounter (Signed)
Noted mammogram results from 04/11/2014 with no suspicious areas.   Ok to give patient results?

## 2014-05-10 NOTE — Telephone Encounter (Signed)
Call placed to patient and patient made aware.  

## 2014-05-15 ENCOUNTER — Telehealth: Payer: Self-pay | Admitting: *Deleted

## 2014-05-15 NOTE — Telephone Encounter (Signed)
She has Percocet which is stronger than the tylenol with codeine, this is tylenol with oxycodone, this will work better than this.

## 2014-05-15 NOTE — Telephone Encounter (Signed)
LMTRC

## 2014-05-15 NOTE — Telephone Encounter (Signed)
Wants to know if she can get a refill on tylenol with codeine still claims still has pain in shoulder and goes down to ribs. MD please advise!  Pharmacy [facility]

## 2014-05-16 NOTE — Telephone Encounter (Signed)
LMTRC

## 2014-05-17 NOTE — Telephone Encounter (Signed)
noted 

## 2014-05-17 NOTE — Telephone Encounter (Signed)
Pt states has gotten her O2 under control now and has on oxygen but will go to ER if she feels the O2 drops.

## 2014-05-17 NOTE — Telephone Encounter (Signed)
Pt states taking percocet every night at least half pill and sometimes takes a whole one, but takes for lower part of her body. Says when she breathes she can feel the pain and used heating pad has helped some but feels like a pulled muscle affected whole back and arms. Has not went and had Xray done because feels like dont need to have done states  doctor told her she will hold off on it for a bit. States need refill on prednisone also states her breathing is bad and O2 sats are down to 88 with Oxygen. Please advise.

## 2014-05-17 NOTE — Telephone Encounter (Signed)
Please call pt, she just completed the 9 day course of steroids, if her breathing is still low on oxygen and not improving she needs to go to the ER for medications. This is very important send her to the ER , they can do CXR and give her treatments

## 2014-06-02 ENCOUNTER — Ambulatory Visit (HOSPITAL_COMMUNITY)
Admission: RE | Admit: 2014-06-02 | Discharge: 2014-06-02 | Disposition: A | Discharge status: Home / Self Care | Payer: Medicare Other | Source: Ambulatory Visit | Attending: Urology | Admitting: Urology

## 2014-06-02 ENCOUNTER — Other Ambulatory Visit: Payer: Self-pay | Admitting: Urology

## 2014-06-02 DIAGNOSIS — M439 Deforming dorsopathy, unspecified: Secondary | ICD-10-CM | POA: Insufficient documentation

## 2014-06-02 DIAGNOSIS — N200 Calculus of kidney: Secondary | ICD-10-CM

## 2014-06-06 ENCOUNTER — Ambulatory Visit (INDEPENDENT_AMBULATORY_CARE_PROVIDER_SITE_OTHER): Payer: Medicare Other | Admitting: Urology

## 2014-06-06 DIAGNOSIS — N200 Calculus of kidney: Secondary | ICD-10-CM

## 2014-06-19 ENCOUNTER — Encounter: Payer: Self-pay | Admitting: Family Medicine

## 2014-06-19 ENCOUNTER — Ambulatory Visit (INDEPENDENT_AMBULATORY_CARE_PROVIDER_SITE_OTHER): Payer: Medicare Other | Admitting: Family Medicine

## 2014-06-19 VITALS — BP 122/70 | HR 90 | Temp 98.3°F | Resp 18 | Ht 61.0 in | Wt 156.0 lb

## 2014-06-19 DIAGNOSIS — J441 Chronic obstructive pulmonary disease with (acute) exacerbation: Secondary | ICD-10-CM | POA: Diagnosis not present

## 2014-06-19 MED ORDER — IPRATROPIUM-ALBUTEROL 0.5-2.5 (3) MG/3ML IN SOLN
3.0000 mL | Freq: Once | RESPIRATORY_TRACT | Status: AC
  Administered 2014-06-19: 3 mL via RESPIRATORY_TRACT

## 2014-06-19 MED ORDER — METHYLPREDNISOLONE ACETATE 80 MG/ML IJ SUSP
80.0000 mg | Freq: Once | INTRAMUSCULAR | Status: AC
  Administered 2014-06-19: 80 mg via INTRAMUSCULAR

## 2014-06-19 MED ORDER — AMOXICILLIN-POT CLAVULANATE 875-125 MG PO TABS: 1.0000 | ORAL_TABLET | Freq: Two times a day (BID) | ORAL | Status: DC

## 2014-06-19 MED ORDER — PREDNISONE 10 MG PO TABS: ORAL_TABLET | ORAL | Status: DC

## 2014-06-19 NOTE — Assessment & Plan Note (Addendum)
Treat with Depo Medrol 80mg  Augmentin Mucinex Pt declines ER treatment which I think she would benefit from based on current status She agrees to return in 48 hours for recheck

## 2014-06-19 NOTE — Patient Instructions (Signed)
Start antibiotics Start prednisone tablets in the morning Use nebulizer every 4 hours Return on Wed for recheck

## 2014-06-19 NOTE — Addendum Note (Signed)
Addended by: Vic Blackbird F on: 06/19/2014 10:56 AM   Modules accepted: Level of Service

## 2014-06-19 NOTE — Progress Notes (Signed)
Patient ID: Cindy [name], female   DOB: 09-Mar-1956, 57 y.o.   MRN: [id number]   Subjective:    Patient ID: Cindy [name], female    DOB: 1956-09-02, 58 y.o.   MRN: [id number]  Patient presents for SOB  patient here with shortness of breath cough with production and wheezing which is worsened over the weekend. She is history of severe COPD which she continues to smoke. She is being followed by pulmonary she is taking her inhalers as prescribed however with her continued smoking she continues to have back-to-back exacerbations. She declines going to the emergency room. She is using 2 L of oxygen. She did use her nebulizer over the weekend as well as her other medications to    Review Of Systems:  GEN- denies fatigue, fever, weight loss,weakness, recent illness HEENT- denies eye drainage, change in vision, nasal discharge, CVS- denies chest pain, palpitations RESP- + SOB, +cough,+ wheeze ABD- denies N/V, change in stools, abd pain Neuro- denies headache, dizziness, syncope, seizure activity       Objective:    BP 122/70  Pulse 90  Temp(Src) 98.3 F (36.8 C) (Oral)  Resp 18  Ht 5\' 1"  (1.549 m)  Wt 156 lb (70.761 kg)  BMI 29.49 kg/m2  SpO2 97% GEN- NAD, alert and oriented x3 HEENT- PERRL, EOMI, non injected sclera, pink conjunctiva, MMM, oropharynx clear Neck- Supple,  CVS- RRR, no murmur RESP-bilat rhonchi, +bronchospasm, decreased air movement, +retractions EXT- No edema Pulses- Radial 2+  S/p neb- improved air movement, retractions improved, able to speak in sentences     Assessment & Plan:      Problem List Items Addressed This Visit   None      Note: This dictation was prepared with Dragon dictation along with smaller phrase technology. Any transcriptional errors that result from this process are unintentional.

## 2014-06-21 ENCOUNTER — Encounter: Payer: Self-pay | Admitting: Family Medicine

## 2014-06-21 ENCOUNTER — Ambulatory Visit (INDEPENDENT_AMBULATORY_CARE_PROVIDER_SITE_OTHER): Payer: Medicare Other | Admitting: Family Medicine

## 2014-06-21 VITALS — BP 132/78 | HR 90 | Temp 98.0°F | Resp 18 | Ht 61.0 in | Wt 156.0 lb

## 2014-06-21 DIAGNOSIS — E785 Hyperlipidemia, unspecified: Secondary | ICD-10-CM

## 2014-06-21 DIAGNOSIS — J441 Chronic obstructive pulmonary disease with (acute) exacerbation: Secondary | ICD-10-CM

## 2014-06-21 MED ORDER — SIMVASTATIN 20 MG PO TABS: 20.0000 mg | ORAL_TABLET | Freq: Every day | ORAL | Status: DC

## 2014-06-21 NOTE — Assessment & Plan Note (Signed)
She is improving however we still have a little waist ago with her exacerbation. She will continue her current regimen of prednisone nebulizer albuterol antibiotics Mucinex

## 2014-06-21 NOTE — Patient Instructions (Signed)
Complete the antibiotics and prednisone Take the mucinex twice a day  F/u as previous GO TO ER IF YOU GET WORSE

## 2014-06-21 NOTE — Assessment & Plan Note (Signed)
I will decrease her back to Zocor 20 mg a she tolerates this dose better.

## 2014-06-21 NOTE — Progress Notes (Signed)
Patient ID: Cindy [name], female   DOB: [birth year], 57 y.o.   MRN: [id number]   Subjective:    Patient ID: Cindy [name], female    DOB: 04/01/1956, 58 y.o.   MRN: [id number]  Patient presents for F/U COPD Exercabation  patient here for interim visit followup COPD exacerbation. She was seen about 48 hours ago she states her breathing is much improved she still using her oxygen however not using her nebulizer. She continues to have cough and wheezing.  He also inquired about her cholesterol medication states when she went to 40 mg this was causing increased leg pain to    Review Of Systems:  GEN- denies fatigue, fever, weight loss,weakness, recent illness HEENT- denies eye drainage, change in vision, nasal discharge, CVS- denies chest pain, palpitations RESP- + SOB, +cough, +wheeze MSK- denies joint pain, +muscle aches, injury Neuro- denies headache, dizziness, syncope, seizure activity       Objective:    BP 132/78  Pulse 90  Temp(Src) 98 F (36.7 C) (Oral)  Resp 18  Ht 5\' 1"  (1.549 m)  Wt 156 lb (70.761 kg)  BMI 29.49 kg/m2  SpO2 96% GEN- NAD, alert and oriented x3 CVS- RRR, no murmur RESP-bilat rhonchi, +bronchospasm, fair air movement, No retractions EXT- No edema Pulses- Radial 2+      Assessment & Plan:      Problem List Items Addressed This Visit   None    Visit Diagnoses   HLD (hyperlipidemia)    -  Primary    Relevant Medications       simvastatin (ZOCOR) tablet       Note: This dictation was prepared with Dragon dictation along with smaller phrase technology. Any transcriptional errors that result from this process are unintentional.

## 2014-07-05 ENCOUNTER — Encounter: Payer: Self-pay | Admitting: Emergency Medicine

## 2014-07-05 ENCOUNTER — Ambulatory Visit (INDEPENDENT_AMBULATORY_CARE_PROVIDER_SITE_OTHER): Payer: Medicare Other | Admitting: Emergency Medicine

## 2014-07-05 VITALS — BP 130/84 | HR 101 | Ht 61.0 in | Wt 157.0 lb

## 2014-07-05 DIAGNOSIS — J449 Chronic obstructive pulmonary disease, unspecified: Secondary | ICD-10-CM

## 2014-07-05 NOTE — Assessment & Plan Note (Signed)
Please continue your symbicort and spiriva You need to work hard on stopping smoking. Try to get down to 10 cigarettes a day by our next visit.  Use albuterol as needed for shortness of breath.  Wear your oxygen at all times.  Start using mucinex 600mg  twice a day You may use Delsym up to twice a day if needed to suppress your cough.  Follow with Dr Lamonte Sakai in 3 months or sooner if you have any problems.

## 2014-07-05 NOTE — Progress Notes (Signed)
Subjective:    Patient ID: Cindy [name], female    DOB: 07-May-1956, 58 y.o.   MRN: [id number]  HPI 58 yo woman, smoker (40pk-yrs), hyperlipidemia. Followed by Dr Buelah Manis and on Spiriva + Symbicort + ProAir prn, FEV1 19% predicted on prior PFT! She has had several exacerbations over the last year. Was first noted to be hypoxemic in 2010, is on O2 that she is using prn.  She has exertional wheeze, SOB. Coughs daily - productive white, thick.   ROV 09/27/12 -- follows up COPD, hypoxemia, on Spiriva/Symbicort. 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 Since last visit has been by Dr Buelah Manis and in the ED, treated with pred for chest tightness.  Uses albuterol every 6 hours. Hasn;t had a-1 AT yet to my knowledge.   ROV 12/01/12 -- COPD + hypoxemia, severe AFL. Returns for f/u. Continues to smoke 0.5-1.0 pk/day. About last 3 weeks, URI sx, nasal congestion, sore throat. Was treated by Dr Buelah Manis with augmentin and prednisone. Remains on Spiriva + Symbicort.   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 Has been seen in the ED and by Dr Buelah Manis several times since our last visit. She is finishing augmentin + pred now to treat recent AE + bronchitic sx. She continues to smoke > 1pk/day. Using SABA a few times a week. Improved cough, green sputum.   ROV 02/22/13 -- severe COPD, frequent AE's, hypoxemia. Active tobacco use. We started daliresp last time. She reports that she is doing OK, she was treated with abx/pred before our last visit 1/17. She reports constipation since last visit, ? From daliresp. She is smoking >1pk/day. She is asking for prednisone for her back pain, constipation.   ROV 04/22/13 -- Active smoker, Severe COPD, frequent AE's most recently in March (since our last appt), ? Due to allergies. Finished pred, started fluticasone. She is dealing with pain from compression fracture. Remains on symbicort and spiriva. She has also recently been  found to have L adrenal adenoma > to be seen by urology, [place].   ROV 06/14/13 -- Active smoker, Severe COPD, frequent AE's, allergic rhinitis. She was admitted for N/V, ? Renal stone/UTI, a COPD exac 6/1-6/2. She is feeling better, no evidence exacerbation right now. She is on Symbicort, Spiriva.   ROV 08/25/13 -- Active smoker, Severe COPD, frequent AE's, allergic rhinitis. She c/o worsening over the last week >  Having radicular LE pain, she has had more chest tightness this week and SOB. She saw Dr Buelah Manis last week and has been treated with augmentin+ pred taper. She has improved after rx for AE.   ROV 10/27/13 -- Active smoker, Severe COPD, frequent AE's, allergic rhinitis. Here for f/u. She tells me that she feels very well, just treated with prednisone + azithro for nasal congestion > finally improved. On Spiriva + Symbicort. Wears her O2 with some exertion but not all. Smoking 1 pk a day.   ROV 12/16/13 -- Active smoker > 1 pack/day, Severe COPD, frequent AE's, allergic rhinitis. She presents today for f/u. She developed some chest tightness, increased nasal congestion, cough with clear phlegm, possible chills, no documented fevers. She saw Dr Buelah Manis and was treated for an AE with solumedrol, prednisone and augmentin.   ROV 03/14/14 -- severe COPD, continues to smoke, allergies, freq AE's. She is currently being treated with pred + azithro, finishing today. This was her first flare since 12/16/13.  Still smokes a pack a  day. Discussed in detail today. She is willing to try to cut down. We talked about strategies today.   ROV 07/05/14 -- severe COPD, continues to smoke, allergies, freq AE's. She has been treated by Dr Buelah Manis x 2 for AE's with pred and abx. She had PFT today that show FEV1 0.68 - 0.81L (29-34% predicted). Her spiriva was changed to the respimat version. She is smoking 1 pk a day.    PULMONARY FUNCTON TEST 09/27/2012  FVC 2.31  FEV1 .87  FEV1/FVC 37.7  FVC  % Predicted 76  FEV  % Predicted 38  FeF 25-75 .3  FeF 25-75 % Predicted 2.65       Objective:   Physical Exam Filed Vitals:   07/05/14 1012  BP: 130/84  Pulse: 101  Height: 5\' 1"  (1.549 m)  Weight: 157 lb (71.215 kg)  SpO2: 98%   Gen: Pleasant, well-nourished, in no distress, very tangential, difficult history giver  ENT: No lesions,  mouth clear,  oropharynx clear, no postnasal drip  Neck: No JVD, no TMG, no carotid bruits, some mild UA exp noise  Lungs: No use of accessory muscles, B rhonchi, B exp wheezes.   Cardiovascular: RRR, heart sounds normal, no murmur or gallops, no peripheral edema  Musculoskeletal: No deformities, no cyanosis or clubbing  Neuro: alert, non focal  Skin: Warm, no lesions or rashes     Assessment & Plan:  C O P D Please continue your symbicort and spiriva You need to work hard on stopping smoking. Try to get down to 10 cigarettes a day by our next visit.  Use albuterol as needed for shortness of breath.  Wear your oxygen at all times.  Start using mucinex 600mg  twice a day You may use Delsym up to twice a day if needed to suppress your cough.  Follow with Dr Lamonte Sakai in 3 months or sooner if you have any problems.

## 2014-07-05 NOTE — Progress Notes (Signed)
PFT done today. 

## 2014-07-05 NOTE — Patient Instructions (Signed)
Please continue your symbicort and spiriva You need to work hard on stopping smoking. Try to get down to 10 cigarettes a day by our next visit.  Use albuterol as needed for shortness of breath.  Wear your oxygen at all times.  Start using mucinex 600mg  twice a day You may use Delsym up to twice a day if needed to suppress your cough.  Follow with Dr Lamonte Sakai in 3 months or sooner if you have any problems.

## 2014-07-11 ENCOUNTER — Encounter: Payer: Self-pay | Admitting: Family Medicine

## 2014-07-11 ENCOUNTER — Ambulatory Visit (INDEPENDENT_AMBULATORY_CARE_PROVIDER_SITE_OTHER): Payer: Medicare Other | Admitting: Family Medicine

## 2014-07-11 VITALS — BP 132/74 | HR 84 | Temp 97.9°F | Resp 18 | Ht 61.0 in | Wt 157.0 lb

## 2014-07-11 DIAGNOSIS — E785 Hyperlipidemia, unspecified: Secondary | ICD-10-CM | POA: Diagnosis not present

## 2014-07-11 DIAGNOSIS — J449 Chronic obstructive pulmonary disease, unspecified: Secondary | ICD-10-CM | POA: Diagnosis not present

## 2014-07-11 DIAGNOSIS — Z720 Tobacco use: Secondary | ICD-10-CM

## 2014-07-11 DIAGNOSIS — F17200 Nicotine dependence, unspecified, uncomplicated: Secondary | ICD-10-CM | POA: Diagnosis not present

## 2014-07-11 DIAGNOSIS — L299 Pruritus, unspecified: Secondary | ICD-10-CM | POA: Diagnosis not present

## 2014-07-11 DIAGNOSIS — M810 Age-related osteoporosis without current pathological fracture: Secondary | ICD-10-CM

## 2014-07-11 LAB — CBC WITH DIFFERENTIAL/PLATELET
BASOS ABS: 0.1 10*3/uL (ref 0.0–0.1)
Basophils Relative: 1 % (ref 0–1)
EOS ABS: 0.2 10*3/uL (ref 0.0–0.7)
EOS PCT: 2 % (ref 0–5)
HCT: 42.4 % (ref 36.0–46.0)
Hemoglobin: 14.2 g/dL (ref 12.0–15.0)
LYMPHS PCT: 34 % (ref 12–46)
Lymphs Abs: 3.5 10*3/uL (ref 0.7–4.0)
MCH: 30.9 pg (ref 26.0–34.0)
MCHC: 33.5 g/dL (ref 30.0–36.0)
MCV: 92.2 fL (ref 78.0–100.0)
Monocytes Absolute: 1 10*3/uL (ref 0.1–1.0)
Monocytes Relative: 10 % (ref 3–12)
Neutro Abs: 5.5 10*3/uL (ref 1.7–7.7)
Neutrophils Relative %: 53 % (ref 43–77)
PLATELETS: 254 10*3/uL (ref 150–400)
RBC: 4.6 MIL/uL (ref 3.87–5.11)
RDW: 13.8 % (ref 11.5–15.5)
WBC: 10.3 10*3/uL (ref 4.0–10.5)

## 2014-07-11 LAB — COMPREHENSIVE METABOLIC PANEL
ALT: 26 U/L (ref 0–35)
AST: 21 U/L (ref 0–37)
Albumin: 4.2 g/dL (ref 3.5–5.2)
Alkaline Phosphatase: 54 U/L (ref 39–117)
BUN: 10 mg/dL (ref 6–23)
CALCIUM: 9.7 mg/dL (ref 8.4–10.5)
CHLORIDE: 102 meq/L (ref 96–112)
CO2: 34 mEq/L — ABNORMAL HIGH (ref 19–32)
CREATININE: 0.74 mg/dL (ref 0.50–1.10)
Glucose, Bld: 82 mg/dL (ref 70–99)
Potassium: 4.5 mEq/L (ref 3.5–5.3)
Sodium: 142 mEq/L (ref 135–145)
Total Bilirubin: 0.4 mg/dL (ref 0.2–1.2)
Total Protein: 6.5 g/dL (ref 6.0–8.3)

## 2014-07-11 LAB — LIPID PANEL
CHOL/HDL RATIO: 2.8 ratio
Cholesterol: 201 mg/dL — ABNORMAL HIGH (ref 0–200)
HDL: 71 mg/dL (ref >39)
LDL Cholesterol: 102 mg/dL — ABNORMAL HIGH (ref 0–99)
Triglycerides: 139 mg/dL (ref <150)
VLDL: 28 mg/dL (ref 0–40)

## 2014-07-11 NOTE — Progress Notes (Signed)
Patient ID: Cindy [name], female   DOB: [birth year], 58 y.o.   MRN: [id number]   Subjective:    Patient ID: Cindy [name], female    DOB: July 13, 1956, 58 y.o.   MRN: [id number]  Patient presents for 3 month F/U and Rash to bottom  patient here to follow chronic medical problems. She was recently seen for COPD exacerbation. She was seen by her pulmonologist her Spiriva was changed to arrest the mass formula. She is pretty much maxed out on all medications she's been tried on multiple things she continues to smoke heavily and required her oxygen 3 much 24 hours a day. She states she still has some congestion in her chest and she feels sore near her ribs.  Osteoporosis she still is not had her bone density done she still taking her Fosamax in her calcium.  Hyperlipidemia she is taking Zocor 20 mg which he tolerates better she is due for repeat lipid panel.  Rash she states that she had a rash in the gluteal region extending to the vaginal area which was red and very tender to touch. She used some topical that she had home as well as a local cortisone is now resolved she thought it may of been related to the antibiotic. She also complains of itching all over states that she just feels like she needs to itch does not have a rash or any bites. She's had this before after being on medications.    Review Of Systems:  GEN- denies fatigue, fever, weight loss,weakness, recent illness HEENT- denies eye drainage, change in vision, nasal discharge, CVS- denies chest pain, palpitations RESP- denies SOB, +cough, +wheeze ABD- denies N/V, change in stools, abd pain GU- denies dysuria, hematuria, dribbling, incontinence MSK- denies joint pain, muscle aches, injury Neuro- denies headache, dizziness, syncope, seizure activity       Objective:    BP 132/74  Pulse 84  Temp(Src) 97.9 F (36.6 C) (Oral)  Resp 18  Ht 5\' 1"  (1.549 m)  Wt 157 lb (71.215 kg)  BMI 29.68 kg/m2  SpO2 98% GEN- NAD, alert and  oriented x3 HEENT- PERRL, EOMI, non injected sclera, pink conjunctiva, MMM, oropharynx clear Neck- Supple, no LAD, no retractions CVS- RRR, no murmur RESP-mild congestion, good air movement, bilat wheeze, 2 L oxygen  ABD-NABS,soft,NT,ND Skin- +excoriations,  EXT- No edema Pulses- Radial, DP- 2+        Assessment & Plan:      Problem List Items Addressed This Visit   Tobacco abuse (Chronic)   Pruritus     Possible reaction to recent antibiotic however she has been on this many times before, no actual rash ? Prednisone related Will have her use atarax which she has    Osteoporosis     Bone density to be set up again    Hyperlipidemia     Recheck labs    Relevant Orders      Comprehensive metabolic panel      Lipid panel      CBC with Differential   C O P D - Primary     COPD improved from previous exacerbation  She continues to smoke she was seen by pulmonary reviewed the note. She is on oxygen therapy she is very severe COPD       Note: This dictation was prepared with Dragon dictation along with smaller phrase technology. Any transcriptional errors that result from this process are unintentional.

## 2014-07-11 NOTE — Patient Instructions (Addendum)
Bone Density to be set up  We will call with lab results Itching pill- hydroxyzine  Work your diet- low carb-/low fat -- No white foods , baked or grilled fish and checkin, more water  F/U 3 months

## 2014-07-11 NOTE — Assessment & Plan Note (Signed)
Recheck labs 

## 2014-07-11 NOTE — Assessment & Plan Note (Signed)
COPD improved from previous exacerbation  She continues to smoke she was seen by pulmonary reviewed the note. She is on oxygen therapy she is very severe COPD

## 2014-07-11 NOTE — Assessment & Plan Note (Signed)
Bone density to be set up again

## 2014-07-11 NOTE — Assessment & Plan Note (Signed)
Possible reaction to recent antibiotic however she has been on this many times before, no actual rash ? Prednisone related Will have her use atarax which she has

## 2014-07-13 ENCOUNTER — Encounter: Payer: Self-pay | Admitting: *Deleted

## 2014-07-19 LAB — PULMONARY FUNCTION TEST
DL/VA % pred: 81 %
DL/VA: 3.59 ml/min/mmHg/L
DLCO UNC % PRED: 68 %
DLCO unc: 13.76 ml/min/mmHg
FEF 25-75 PRE: 0.28 L/s
FEF 25-75 Post: 0.45 L/sec
FEF2575-%CHANGE-POST: 62 %
FEF2575-%Pred-Post: 20 %
FEF2575-%Pred-Pre: 12 %
FEV1-%CHANGE-POST: 18 %
FEV1-%Pred-Post: 34 %
FEV1-%Pred-Pre: 29 %
FEV1-Post: 0.81 L
FEV1-Pre: 0.68 L
FEV1FVC-%Change-Post: 0 %
FEV1FVC-%Pred-Pre: 53 %
FEV6-%Change-Post: 19 %
FEV6-%PRED-POST: 63 %
FEV6-%Pred-Pre: 53 %
FEV6-POST: 1.85 L
FEV6-PRE: 1.55 L
FEV6FVC-%CHANGE-POST: 0 %
FEV6FVC-%Pred-Post: 96 %
FEV6FVC-%Pred-Pre: 96 %
FVC-%Change-Post: 19 %
FVC-%PRED-PRE: 55 %
FVC-%Pred-Post: 65 %
FVC-POST: 1.97 L
FVC-Pre: 1.65 L
POST FEV6/FVC RATIO: 94 %
Post FEV1/FVC ratio: 41 %
Pre FEV1/FVC ratio: 41 %
Pre FEV6/FVC Ratio: 94 %
RV % pred: 175 %
RV: 3.15 L
TLC % pred: 115 %
TLC: 5.29 L

## 2014-07-21 ENCOUNTER — Telehealth: Payer: Self-pay | Admitting: *Deleted

## 2014-07-21 NOTE — Telephone Encounter (Signed)
Pt has DEXA scan Next Thursday July 30 at 11:00am at [facility] radiology on [address] Pt aware.

## 2014-07-27 ENCOUNTER — Ambulatory Visit (HOSPITAL_COMMUNITY)
Admission: RE | Admit: 2014-07-27 | Discharge: 2014-07-27 | Disposition: A | Discharge status: Home / Self Care | Payer: Medicare Other | Source: Ambulatory Visit | Attending: Family Medicine | Admitting: Family Medicine

## 2014-07-27 DIAGNOSIS — M810 Age-related osteoporosis without current pathological fracture: Secondary | ICD-10-CM | POA: Insufficient documentation

## 2014-07-27 DIAGNOSIS — Z780 Asymptomatic menopausal state: Secondary | ICD-10-CM | POA: Diagnosis not present

## 2014-07-27 DIAGNOSIS — M899 Disorder of bone, unspecified: Secondary | ICD-10-CM | POA: Diagnosis not present

## 2014-07-28 ENCOUNTER — Encounter: Payer: Self-pay | Admitting: *Deleted

## 2014-08-15 ENCOUNTER — Encounter: Payer: Self-pay | Admitting: Family Medicine

## 2014-08-15 ENCOUNTER — Ambulatory Visit (INDEPENDENT_AMBULATORY_CARE_PROVIDER_SITE_OTHER): Payer: Medicare Other | Admitting: Family Medicine

## 2014-08-15 VITALS — BP 122/68 | HR 98 | Temp 97.6°F | Resp 18 | Ht 61.0 in | Wt 154.0 lb

## 2014-08-15 DIAGNOSIS — R739 Hyperglycemia, unspecified: Secondary | ICD-10-CM

## 2014-08-15 DIAGNOSIS — R7309 Other abnormal glucose: Secondary | ICD-10-CM | POA: Diagnosis not present

## 2014-08-15 DIAGNOSIS — F17200 Nicotine dependence, unspecified, uncomplicated: Secondary | ICD-10-CM

## 2014-08-15 DIAGNOSIS — J441 Chronic obstructive pulmonary disease with (acute) exacerbation: Secondary | ICD-10-CM | POA: Diagnosis not present

## 2014-08-15 DIAGNOSIS — T50905A Adverse effect of unspecified drugs, medicaments and biological substances, initial encounter: Secondary | ICD-10-CM

## 2014-08-15 DIAGNOSIS — R21 Rash and other nonspecific skin eruption: Secondary | ICD-10-CM | POA: Diagnosis not present

## 2014-08-15 DIAGNOSIS — T50904A Poisoning by unspecified drugs, medicaments and biological substances, undetermined, initial encounter: Secondary | ICD-10-CM | POA: Diagnosis not present

## 2014-08-15 DIAGNOSIS — Z720 Tobacco use: Secondary | ICD-10-CM

## 2014-08-15 LAB — CBC WITH DIFFERENTIAL/PLATELET
BASOS PCT: 1 % (ref 0–1)
Basophils Absolute: 0.1 10*3/uL (ref 0.0–0.1)
Eosinophils Absolute: 0.2 10*3/uL (ref 0.0–0.7)
Eosinophils Relative: 2 % (ref 0–5)
HCT: 43 % (ref 36.0–46.0)
Hemoglobin: 14.6 g/dL (ref 12.0–15.0)
Lymphocytes Relative: 36 % (ref 12–46)
Lymphs Abs: 3.8 10*3/uL (ref 0.7–4.0)
MCH: 31.1 pg (ref 26.0–34.0)
MCHC: 34 g/dL (ref 30.0–36.0)
MCV: 91.5 fL (ref 78.0–100.0)
Monocytes Absolute: 1 10*3/uL (ref 0.1–1.0)
Monocytes Relative: 9 % (ref 3–12)
NEUTROS ABS: 5.5 10*3/uL (ref 1.7–7.7)
NEUTROS PCT: 52 % (ref 43–77)
Platelets: 281 10*3/uL (ref 150–400)
RBC: 4.7 MIL/uL (ref 3.87–5.11)
RDW: 13.5 % (ref 11.5–15.5)
WBC: 10.6 10*3/uL — ABNORMAL HIGH (ref 4.0–10.5)

## 2014-08-15 LAB — HEMOGLOBIN A1C
HEMOGLOBIN A1C: 6 % — AB (ref <5.7)
MEAN PLASMA GLUCOSE: 126 mg/dL — AB (ref <117)

## 2014-08-15 MED ORDER — PREDNISONE 10 MG PO TABS: ORAL_TABLET | ORAL | Status: DC

## 2014-08-15 MED ORDER — METHYLPREDNISOLONE ACETATE 40 MG/ML IJ SUSP
40.0000 mg | Freq: Once | INTRAMUSCULAR | Status: AC
  Administered 2014-08-15: 40 mg via INTRAMUSCULAR

## 2014-08-15 MED ORDER — NYSTATIN 100000 UNIT/ML MT SUSP: 5.0000 mL | Freq: Four times a day (QID) | OROMUCOSAL | Status: DC

## 2014-08-15 MED ORDER — TIOTROPIUM BROMIDE MONOHYDRATE 2.5 MCG/ACT IN AERS: 2.0000 | INHALATION_SPRAY | Freq: Every day | RESPIRATORY_TRACT | Status: DC

## 2014-08-15 NOTE — Assessment & Plan Note (Signed)
Predisone per above, benadryl can be used as well Unclear cause, some type of contact dermatitis

## 2014-08-15 NOTE — Progress Notes (Signed)
Patient ID: Cindy [name], female   DOB: [birth year], 58 y.o.   MRN: [id number]   Subjective:    Patient ID: Cindy [name], female    DOB: [birth year], 58 y.o.   MRN: [id number]  Patient presents for COPD  Patient here for typical COPD exacerbation. Her symptoms started about 3 weeks ago with a mild cough and sore throat which has progressed. She also had to self treat for thrush which is starting to improve. She's been using her nebulizer and her inhalers. She states that she has been worse in the past but she feels like she is getting there. She still wearing her oxygen 2 L she continues to smoke. She also has had some itching and a rash pop up on her lower legs she also gets easy bruising which has been chronic.    Review Of Systems:  GEN- denies fatigue, fever, weight loss,weakness, recent illness HEENT- denies eye drainage, change in vision, nasal discharge, CVS- denies chest pain, palpitations RESP-+ SOB,+ cough,+ wheeze ABD- denies N/V, change in stools, abd pain Neuro- denies headache, dizziness, syncope, seizure activity       Objective:    BP 122/68  Pulse 98  Temp(Src) 97.6 F (36.4 C) (Oral)  Resp 18  Ht 5\' 1"  (1.549 m)  Wt 154 lb (69.854 kg)  BMI 29.11 kg/m2  SpO2 95% GEN- NAD, alert and oriented x3 HEENT- PERRL, EOMI, non injected sclera, pink conjunctiva, MMM, oropharynx clear Neck- Supple, no retractions CVS- RRR, no murmur RESP-, bilat wheeze, + bronchospasm, mild rhonchi, 2 L oxygen  Skin- +excoriations,  Fine maculopapular rash on right shin, ecchymosis bilat arms, few scabbed lesions  EXT- No edema Pulses- Radial 2+      Assessment & Plan:      Problem List Items Addressed This Visit   Rash and nonspecific skin eruption   Hyperglycemia, drug-induced   Relevant Orders      CBC with Differential      Hemoglobin A1c   COPD exacerbation - Primary   Relevant Medications      predniSONE (DELTASONE) tablet      Tiotropium Bromide Monohydrate (SPIRIVA  RESPIMAT) 2.5 MCG/ACT AERS      methylPREDNISolone acetate (DEPO-MEDROL) injection 40 mg (Completed)   Other Relevant Orders      CBC with Differential      Note: This dictation was prepared with Dragon dictation along with smaller phrase technology. Any transcriptional errors that result from this process are unintentional.

## 2014-08-15 NOTE — Patient Instructions (Signed)
Take prednisone Increase mucinex to twice a day Wear oxygen We will call with labs F/U as previous

## 2014-08-15 NOTE — Assessment & Plan Note (Signed)
Will hold on antibiotics as she recently completed a few weeks ago and ? Reaction to amoxicllin now, along with her other antibiotics reactions Prednisone taper given, depo medrol in office, with her severe COPD i query if she will need daily prednisone to help her symptoms Increase mucinex to BID

## 2014-08-17 ENCOUNTER — Encounter: Payer: Self-pay | Admitting: *Deleted

## 2014-08-31 ENCOUNTER — Telehealth: Payer: Self-pay | Admitting: *Deleted

## 2014-08-31 NOTE — Telephone Encounter (Signed)
Patient returned call.   States that she has not ordered or requested.   Prescription denied.

## 2014-08-31 NOTE — Telephone Encounter (Signed)
Received fax from [facility] requesting refill orders for albuterol sulfate for nebulizer.   Call placed to patient to inquire as to whether patient requested.   Cindy [name].

## 2014-10-09 ENCOUNTER — Ambulatory Visit (INDEPENDENT_AMBULATORY_CARE_PROVIDER_SITE_OTHER): Payer: Medicare Other | Admitting: Family Medicine

## 2014-10-09 ENCOUNTER — Encounter: Payer: Self-pay | Admitting: Family Medicine

## 2014-10-09 VITALS — BP 138/74 | HR 106 | Temp 98.6°F | Resp 24 | Ht 61.0 in | Wt 155.0 lb

## 2014-10-09 DIAGNOSIS — E785 Hyperlipidemia, unspecified: Secondary | ICD-10-CM | POA: Diagnosis not present

## 2014-10-09 DIAGNOSIS — Z720 Tobacco use: Secondary | ICD-10-CM

## 2014-10-09 DIAGNOSIS — J441 Chronic obstructive pulmonary disease with (acute) exacerbation: Secondary | ICD-10-CM | POA: Diagnosis not present

## 2014-10-09 MED ORDER — PANTOPRAZOLE SODIUM 40 MG PO TBEC: DELAYED_RELEASE_TABLET | ORAL | Status: DC

## 2014-10-09 MED ORDER — AZITHROMYCIN 500 MG PO TABS: ORAL_TABLET | ORAL | Status: DC

## 2014-10-09 MED ORDER — ALBUTEROL SULFATE (2.5 MG/3ML) 0.083% IN NEBU: 2.5000 mg | INHALATION_SOLUTION | RESPIRATORY_TRACT | Status: DC | PRN

## 2014-10-09 MED ORDER — ALENDRONATE SODIUM 70 MG PO TABS: 70.0000 mg | ORAL_TABLET | ORAL | Status: DC

## 2014-10-09 MED ORDER — METHYLPREDNISOLONE ACETATE 40 MG/ML IJ SUSP
40.0000 mg | Freq: Once | INTRAMUSCULAR | Status: AC
  Administered 2014-10-09: 40 mg via INTRAMUSCULAR

## 2014-10-09 MED ORDER — PREDNISONE 10 MG PO TABS: ORAL_TABLET | ORAL | Status: DC

## 2014-10-09 MED ORDER — SIMVASTATIN 20 MG PO TABS: 20.0000 mg | ORAL_TABLET | Freq: Every day | ORAL | Status: DC

## 2014-10-09 MED ORDER — IPRATROPIUM-ALBUTEROL 0.5-2.5 (3) MG/3ML IN SOLN
3.0000 mL | Freq: Once | RESPIRATORY_TRACT | Status: AC
  Administered 2014-10-09: 3 mL via RESPIRATORY_TRACT

## 2014-10-09 NOTE — Assessment & Plan Note (Signed)
Depo medrol, prednisone taper  Azithromycin 500mg  daily x 5 days Mucinex DM Recheck in 48hours

## 2014-10-09 NOTE — Progress Notes (Signed)
Patient ID: Cindy [name], female   DOB: 04-24-1956, 58 y.o.   MRN: [id number]   Subjective:    Patient ID: Cindy [name], female    DOB: March 27, 1956, 58 y.o.   MRN: [id number]  Patient presents for COPD Exacerbation  Pt started with typical COPD exacerbations over the past 2 weeks, increased wheezing cough with production, wearing oxygen on 2 L, continues to smoke. Using mucinex. Used neb only once, was trying to use albuterol inhaler states it did not help.    Review Of Systems:  GEN- denies fatigue, fever, weight loss,weakness, recent illness HEENT- denies eye drainage, change in vision, nasal discharge, CVS- denies chest pain, palpitations RESP- +SOB, +cough,+ wheeze ABD- denies N/V, change in stools, abd pain Neuro- denies headache, dizziness, syncope, seizure activity       Objective:    BP 138/74  Pulse 106  Temp(Src) 98.6 F (37 C) (Oral)  Resp 24  Ht 5\' 1"  (1.549 m)  Wt 155 lb (70.308 kg)  BMI 29.30 kg/m2  SpO2 96% GEN- NAD, alert and oriented x3 HEENT- PERRL, EOMI, non injected sclera, pink conjunctiva, MMM, oropharynx clear Neck- Supple, no LAD, no retractions CVS- RRR, no murmur RESP-decreased air movement, bilat rhonchi, bilat bronchospasm, s/p neb, improved WOB, still decreased air movement at bases, 96% on 2L EXT- No edema Pulses- Radial 2+        Assessment & Plan:      Problem List Items Addressed This Visit   COPD exacerbation - Primary   Relevant Medications      predniSONE (DELTASONE) tablet      azithromycin (ZITHROMAX) tablet      ipratropium-albuterol (DUONEB) 0.5-2.5 (3) MG/3ML nebulizer solution 3 mL (Completed)      methylPREDNISolone acetate (DEPO-MEDROL) injection 40 mg (Completed)      Note: This dictation was prepared with Dragon dictation along with smaller phrase technology. Any transcriptional errors that result from this process are unintentional.

## 2014-10-09 NOTE — Patient Instructions (Signed)
Use nebulizer every 4 hours Start prednisone tomorrow Start antibiotics F/U Wed for recheck

## 2014-10-11 ENCOUNTER — Encounter: Payer: Self-pay | Admitting: Family Medicine

## 2014-10-11 ENCOUNTER — Ambulatory Visit (INDEPENDENT_AMBULATORY_CARE_PROVIDER_SITE_OTHER): Payer: Medicare Other | Admitting: Family Medicine

## 2014-10-11 VITALS — BP 136/84 | HR 90 | Temp 97.6°F | Resp 20 | Ht 61.0 in | Wt 155.0 lb

## 2014-10-11 DIAGNOSIS — J441 Chronic obstructive pulmonary disease with (acute) exacerbation: Secondary | ICD-10-CM

## 2014-10-11 DIAGNOSIS — Z720 Tobacco use: Secondary | ICD-10-CM

## 2014-10-11 NOTE — Progress Notes (Signed)
Patient ID: Cindy [name], female   DOB: 18-Sep-1956, 58 y.o.   MRN: [id number]   Subjective:    Patient ID: Cindy [name], female    DOB: 02/09/1956, 58 y.o.   MRN: [id number]  Patient presents for 3 month F/U and F/U COPD Exacerbation  Patient here for interim followup on her COPD she was seen 48 hours ago at that time given Depo-Medrol injection as well as prednisone taper azithromycin 500 mg and Mucinex. She is using her nebulizer she still feels tight in her chest but is slowly improving. She is wearing 2 L of oxygen. She's not had any significant fever.   Review Of Systems:  GEN- denies fatigue, fever, weight loss,weakness, recent illness HEENT- denies eye drainage, change in vision, nasal discharge, CVS- denies chest pain, palpitations RESP- +SOB, +cough, +wheeze Neuro- denies headache, dizziness, syncope, seizure activity       Objective:    BP 136/84  Pulse 90  Temp(Src) 97.6 F (36.4 C) (Oral)  Resp 20  Ht 5\' 1"  (1.549 m)  Wt 155 lb (70.308 kg)  BMI 29.30 kg/m2  SpO2 96% GEN- NAD, alert and oriented x3 HEENT- PERRL, EOMI, non injected sclera, pink conjunctiva, MMM, oropharynx clear CVS- RRR, no murmur RESP-improved air movement, bilat rhonchi, bilat wheeze , speaking in full sentences Pulses- Radial 2+        Assessment & Plan:      Problem List Items Addressed This Visit   COPD exacerbation - Primary      Note: This dictation was prepared with Dragon dictation along with smaller phrase technology. Any transcriptional errors that result from this process are unintentional.

## 2014-10-11 NOTE — Patient Instructions (Signed)
Continue current medications Continue prednisone  Take mucinex DM Return in 2 weeks for flu shot - on a Tuesday  F/U 3 months

## 2014-10-11 NOTE — Assessment & Plan Note (Signed)
Continue course of medications. She is improving. She will follow with her lung Dr. next month as scheduled

## 2014-10-24 ENCOUNTER — Ambulatory Visit (INDEPENDENT_AMBULATORY_CARE_PROVIDER_SITE_OTHER): Payer: Medicare Other | Admitting: Family Medicine

## 2014-10-24 DIAGNOSIS — Z23 Encounter for immunization: Secondary | ICD-10-CM | POA: Diagnosis not present

## 2014-10-31 ENCOUNTER — Encounter: Payer: Self-pay | Admitting: Emergency Medicine

## 2014-10-31 ENCOUNTER — Ambulatory Visit (INDEPENDENT_AMBULATORY_CARE_PROVIDER_SITE_OTHER): Payer: Medicare Other | Admitting: Emergency Medicine

## 2014-10-31 DIAGNOSIS — J411 Mucopurulent chronic bronchitis: Secondary | ICD-10-CM | POA: Diagnosis not present

## 2014-10-31 MED ORDER — METHYLPREDNISOLONE ACETATE 80 MG/ML IJ SUSP
120.0000 mg | Freq: Once | INTRAMUSCULAR | Status: AC
  Administered 2014-10-31: 120 mg via INTRAMUSCULAR

## 2014-10-31 MED ORDER — PREDNISONE 10 MG PO TABS: ORAL_TABLET | ORAL | Status: DC

## 2014-10-31 MED ORDER — AMOXICILLIN 500 MG PO TABS: 500.0000 mg | ORAL_TABLET | Freq: Three times a day (TID) | ORAL | Status: DC

## 2014-10-31 NOTE — Assessment & Plan Note (Addendum)
With residual sx of her AE that was recently treated. She has a chronic bronchitic phenotype, is not going to decrease her exacerbations until she stops smoking. I have considered Daliresp. I do not believe she will clearly benefit until she stopped smoking.  - continue current BD's as ordered - depomedrol now - repeat pred taper.  - amoxicillin - rov w TP in 2 weeks, w RB in 4 weeks

## 2014-10-31 NOTE — Patient Instructions (Signed)
You must work on stopping smoking. The cigarettes are preventing the prednisone and antibiotics from working thwell.  Please take prednisone as directed Take amoxicillin to completion (until all are gone).  Follow with Tammy Parrett in 2 weeks.  Follow with Dr Lamonte Sakai in 1 month

## 2014-10-31 NOTE — Addendum Note (Signed)
Addended by: Mathis Bud on: 10/31/2014 09:56 AM   Modules accepted: Orders

## 2014-10-31 NOTE — Progress Notes (Signed)
Subjective:    Patient ID: Cindy [name], female    DOB: 07-May-1956, 58 y.o.   MRN: [id number]  HPI 58 yo woman, smoker (40pk-yrs), hyperlipidemia. Followed by Dr Buelah Manis and on Spiriva + Symbicort + ProAir prn, FEV1 19% predicted on prior PFT! She has had several exacerbations over the last year. Was first noted to be hypoxemic in 2010, is on O2 that she is using prn.  She has exertional wheeze, SOB. Coughs daily - productive white, thick.   ROV 09/27/12 -- follows up COPD, hypoxemia, on Spiriva/Symbicort. 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 Since last visit has been by Dr Buelah Manis and in the ED, treated with pred for chest tightness.  Uses albuterol every 6 hours. Hasn;t had a-1 AT yet to my knowledge.   ROV 12/01/12 -- COPD + hypoxemia, severe AFL. Returns for f/u. Continues to smoke 0.5-1.0 pk/day. About last 3 weeks, URI sx, nasal congestion, sore throat. Was treated by Dr Buelah Manis with augmentin and prednisone. Remains on Spiriva + Symbicort.   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 Has been seen in the ED and by Dr Buelah Manis several times since our last visit. She is finishing augmentin + pred now to treat recent AE + bronchitic sx. She continues to smoke > 1pk/day. Using SABA a few times a week. Improved cough, green sputum.   ROV 02/22/13 -- severe COPD, frequent AE's, hypoxemia. Active tobacco use. We started daliresp last time. She reports that she is doing OK, she was treated with abx/pred before our last visit 1/17. She reports constipation since last visit, ? From daliresp. She is smoking >1pk/day. She is asking for prednisone for her back pain, constipation.   ROV 04/22/13 -- Active smoker, Severe COPD, frequent AE's most recently in March (since our last appt), ? Due to allergies. Finished pred, started fluticasone. She is dealing with pain from compression fracture. Remains on symbicort and spiriva. She has also recently been  found to have L adrenal adenoma > to be seen by urology, [place].   ROV 06/14/13 -- Active smoker, Severe COPD, frequent AE's, allergic rhinitis. She was admitted for N/V, ? Renal stone/UTI, a COPD exac 6/1-6/2. She is feeling better, no evidence exacerbation right now. She is on Symbicort, Spiriva.   ROV 08/25/13 -- Active smoker, Severe COPD, frequent AE's, allergic rhinitis. She c/o worsening over the last week >  Having radicular LE pain, she has had more chest tightness this week and SOB. She saw Dr Buelah Manis last week and has been treated with augmentin+ pred taper. She has improved after rx for AE.   ROV 10/27/13 -- Active smoker, Severe COPD, frequent AE's, allergic rhinitis. Here for f/u. She tells me that she feels very well, just treated with prednisone + azithro for nasal congestion > finally improved. On Spiriva + Symbicort. Wears her O2 with some exertion but not all. Smoking 1 pk a day.   ROV 12/16/13 -- Active smoker > 1 pack/day, Severe COPD, frequent AE's, allergic rhinitis. She presents today for f/u. She developed some chest tightness, increased nasal congestion, cough with clear phlegm, possible chills, no documented fevers. She saw Dr Buelah Manis and was treated for an AE with solumedrol, prednisone and augmentin.   ROV 03/14/14 -- severe COPD, continues to smoke, allergies, freq AE's. She is currently being treated with pred + azithro, finishing today. This was her first flare since 12/16/13.  Still smokes a pack a  day. Discussed in detail today. She is willing to try to cut down. We talked about strategies today.   ROV 07/05/14 -- severe COPD, continues to smoke, allergies, freq AE's. She has been treated by Dr Buelah Manis x 2 for AE's with pred and abx. She had PFT today that show FEV1 0.68 - 0.81L (29-34% predicted). Her spiriva was changed to the respimat version. She is smoking 1 pk a day.   ROV 10/31/14 -- follow up visit for tobacco use, severe COPD rhinitis. She flares frequently.  Presents today with hoarseness, continued cough, just received azithro + pred taper.  She doesn't feel that she is over it yet - still with chest tightness.    PULMONARY FUNCTON TEST 09/27/2012  FVC 2.31  FEV1 .87  FEV1/FVC 37.7  FVC  % Predicted 76  FEV % Predicted 38  FeF 25-75 .3  FeF 25-75 % Predicted 2.65       Objective:   Physical Exam Filed Vitals:   10/31/14 0857  BP: 128/72  Pulse: 85  Temp: 97.5 F (36.4 C)  TempSrc: Oral  Height: 5\' 1"  (1.549 m)  Weight: 155 lb 9.6 oz (70.58 kg)  SpO2: 100%   Gen: Pleasant, well-nourished, in no distress, very tangential, difficult history giver  ENT: No lesions,  mouth clear,  oropharynx clear, no postnasal drip  Neck: No JVD, no TMG, no carotid bruits, some mild UA exp noise  Lungs: No use of accessory muscles, B rhonchi, B exp wheezes.   Cardiovascular: RRR, heart sounds normal, no murmur or gallops, no peripheral edema  Musculoskeletal: No deformities, no cyanosis or clubbing  Neuro: alert, non focal  Skin: Warm, no lesions or rashes     Assessment & Plan:  COPD (chronic obstructive pulmonary disease) With residual sx of her AE that was recently treated. She has a chronic bronchitic phenotype, is not going to decrease her exacerbations until she stops smoking. I have considered Daliresp. I do not believe she will clearly benefit until she stopped smoking.  - continue current BD's as ordered - depomedrol now - repeat pred taper.  - amoxicillin - rov w TP in 2 weeks, w RB in 4 weeks

## 2014-11-10 ENCOUNTER — Telehealth: Payer: Self-pay | Admitting: Emergency Medicine

## 2014-11-10 MED ORDER — AZITHROMYCIN 250 MG PO TABS
250.0000 mg | ORAL_TABLET | ORAL | Status: DC
Start: 2014-11-10 — End: 2014-11-30

## 2014-11-10 MED ORDER — PROMETHAZINE-CODEINE 6.25-10 MG/5ML PO SYRP: 5.0000 mL | ORAL_SOLUTION | Freq: Four times a day (QID) | ORAL | Status: DC | PRN

## 2014-11-10 NOTE — Telephone Encounter (Signed)
Pt calling c/o hoarseness, cough, chest congestion-clear mucus and eye congestion - eyes keep matting throughout the day Pt seen by PCP 10/20/14, given flu vaccine. Reports that she has been hoarse and congested since.  Requests cough medication to help with cough suppression. Pt reports no infection in lungs.   Allergies  Allergen Reactions  . Keflex [Cephalexin] Anaphylaxis and Rash  . Doxycycline Other (See Comments)    Gave patient oral thrush  . Sulfa Antibiotics   . Avelox [Moxifloxacin Hcl In Nacl] Itching and Rash  . Toradol [Ketorolac Tromethamine] Swelling, Rash and Other (See Comments)    Bruising, swelling, rash at injection site   Please advise Dr Annamaria Boots as Dr Lamonte Sakai is not in office. Thanks.

## 2014-11-10 NOTE — Telephone Encounter (Signed)
Offer Zpak  Offer prometh codeine cough syrup, 120 ml,  106ml every 6 hours prn cough

## 2014-11-10 NOTE — Telephone Encounter (Signed)
Spoke with the pt and notified of recs per CDY  She verbalized understanding  Rx were called in  Pt will call if not improving

## 2014-11-14 ENCOUNTER — Ambulatory Visit: Payer: Medicare Other | Admitting: Adult Health

## 2014-11-20 ENCOUNTER — Ambulatory Visit: Payer: Medicare Other | Admitting: Adult Health

## 2014-11-30 ENCOUNTER — Encounter: Payer: Self-pay | Admitting: Emergency Medicine

## 2014-11-30 ENCOUNTER — Ambulatory Visit: Payer: Medicare Other | Admitting: Emergency Medicine

## 2014-11-30 VITALS — BP 98/66 | HR 95 | Ht 61.0 in | Wt 155.4 lb

## 2014-11-30 DIAGNOSIS — J411 Mucopurulent chronic bronchitis: Secondary | ICD-10-CM

## 2014-11-30 MED ORDER — PREDNISONE 10 MG PO TABS: 10.0000 mg | ORAL_TABLET | Freq: Every day | ORAL | Status: DC

## 2014-11-30 MED ORDER — PROMETHAZINE-CODEINE 6.25-10 MG/5ML PO SYRP: 5.0000 mL | ORAL_SOLUTION | Freq: Four times a day (QID) | ORAL | Status: DC | PRN

## 2014-11-30 NOTE — Progress Notes (Signed)
Subjective:    Patient ID: Cindy [name], female    DOB: 07-May-1956, 58 y.o.   MRN: [id number]  HPI 58 yo woman, smoker (40pk-yrs), hyperlipidemia. Followed by Dr Buelah Manis and on Spiriva + Symbicort + ProAir prn, FEV1 19% predicted on prior PFT! She has had several exacerbations over the last year. Was first noted to be hypoxemic in 2010, is on O2 that she is using prn.  She has exertional wheeze, SOB. Coughs daily - productive white, thick.   ROV 09/27/12 -- follows up COPD, hypoxemia, on Spiriva/Symbicort. 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 Since last visit has been by Dr Buelah Manis and in the ED, treated with pred for chest tightness.  Uses albuterol every 6 hours. Hasn;t had a-1 AT yet to my knowledge.   ROV 12/01/12 -- COPD + hypoxemia, severe AFL. Returns for f/u. Continues to smoke 0.5-1.0 pk/day. About last 3 weeks, URI sx, nasal congestion, sore throat. Was treated by Dr Buelah Manis with augmentin and prednisone. Remains on Spiriva + Symbicort.   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 Has been seen in the ED and by Dr Buelah Manis several times since our last visit. She is finishing augmentin + pred now to treat recent AE + bronchitic sx. She continues to smoke > 1pk/day. Using SABA a few times a week. Improved cough, green sputum.   ROV 02/22/13 -- severe COPD, frequent AE's, hypoxemia. Active tobacco use. We started daliresp last time. She reports that she is doing OK, she was treated with abx/pred before our last visit 1/17. She reports constipation since last visit, ? From daliresp. She is smoking >1pk/day. She is asking for prednisone for her back pain, constipation.   ROV 04/22/13 -- Active smoker, Severe COPD, frequent AE's most recently in March (since our last appt), ? Due to allergies. Finished pred, started fluticasone. She is dealing with pain from compression fracture. Remains on symbicort and spiriva. She has also recently been  found to have L adrenal adenoma > to be seen by urology, [place].   ROV 06/14/13 -- Active smoker, Severe COPD, frequent AE's, allergic rhinitis. She was admitted for N/V, ? Renal stone/UTI, a COPD exac 6/1-6/2. She is feeling better, no evidence exacerbation right now. She is on Symbicort, Spiriva.   ROV 08/25/13 -- Active smoker, Severe COPD, frequent AE's, allergic rhinitis. She c/o worsening over the last week >  Having radicular LE pain, she has had more chest tightness this week and SOB. She saw Dr Buelah Manis last week and has been treated with augmentin+ pred taper. She has improved after rx for AE.   ROV 10/27/13 -- Active smoker, Severe COPD, frequent AE's, allergic rhinitis. Here for f/u. She tells me that she feels very well, just treated with prednisone + azithro for nasal congestion > finally improved. On Spiriva + Symbicort. Wears her O2 with some exertion but not all. Smoking 1 pk a day.   ROV 12/16/13 -- Active smoker > 1 pack/day, Severe COPD, frequent AE's, allergic rhinitis. She presents today for f/u. She developed some chest tightness, increased nasal congestion, cough with clear phlegm, possible chills, no documented fevers. She saw Dr Buelah Manis and was treated for an AE with solumedrol, prednisone and augmentin.   ROV 03/14/14 -- severe COPD, continues to smoke, allergies, freq AE's. She is currently being treated with pred + azithro, finishing today. This was her first flare since 12/16/13.  Still smokes a pack a  day. Discussed in detail today. She is willing to try to cut down. We talked about strategies today.   ROV 07/05/14 -- severe COPD, continues to smoke, allergies, freq AE's. She has been treated by Dr Buelah Manis x 2 for AE's with pred and abx. She had PFT today that show FEV1 0.68 - 0.81L (29-34% predicted). Her spiriva was changed to the respimat version. She is smoking 1 pk a day.   ROV 10/31/14 -- follow up visit for tobacco use, severe COPD rhinitis. She flares frequently.  Presents today with hoarseness, continued cough, just received azithro + pred taper.  She doesn't feel that she is over it yet - still with chest tightness.   ROV 11/30/14 -- follows up for COPD and continued tobacco use. She has a chronic bronchitis component and is seen frequently. Last month we treated her for an AE. She is improved today - she has had some headache and a tickle or pain in her upper chest. She has noticed some exertional desaturations. She has needed cough medication.    PULMONARY FUNCTON TEST 09/27/2012  FVC 2.31  FEV1 .87  FEV1/FVC 37.7  FVC  % Predicted 76  FEV % Predicted 38  FeF 25-75 .3  FeF 25-75 % Predicted 2.65       Objective:   Physical Exam Filed Vitals:   11/30/14 0913  BP: 98/66  Pulse: 95  Height: 5\' 1"  (1.549 m)  Weight: 155 lb 6.4 oz (70.489 kg)  SpO2: 94%   Gen: Pleasant, well-nourished, in no distress, very tangential, difficult history giver  ENT: No lesions,  mouth clear,  oropharynx clear, no postnasal drip  Neck: No JVD, no TMG, no carotid bruits, some mild UA exp noise  Lungs: No use of accessory muscles, B rhonchi, B exp wheezes.   Cardiovascular: RRR, heart sounds normal, no murmur or gallops, no peripheral edema  Musculoskeletal: No deformities, no cyanosis or clubbing  Neuro: alert, non focal  Skin: Warm, no lesions or rashes     Assessment & Plan:  COPD (chronic obstructive pulmonary disease) - continue same BD's - start pred 10mg  qd until next time - O2 - rov 1

## 2014-11-30 NOTE — Assessment & Plan Note (Signed)
-   continue same BD's - start pred 10mg  qd until next time - O2 - rov 1

## 2014-11-30 NOTE — Patient Instructions (Signed)
Please continue your inhaled medications as you have been taking them  Start prednisone 10mg  daily and take every day until our next visit We will refill your cough medicine today Follow with Dr Lamonte Sakai in 1 month

## 2014-12-05 ENCOUNTER — Ambulatory Visit (INDEPENDENT_AMBULATORY_CARE_PROVIDER_SITE_OTHER): Payer: Medicare Other | Admitting: Urology

## 2014-12-05 ENCOUNTER — Other Ambulatory Visit: Payer: Self-pay | Admitting: Urology

## 2014-12-05 ENCOUNTER — Ambulatory Visit (HOSPITAL_COMMUNITY)
Admission: RE | Admit: 2014-12-05 | Discharge: 2014-12-05 | Disposition: A | Discharge status: Home / Self Care | Payer: Medicare Other | Source: Ambulatory Visit | Attending: Urology | Admitting: Urology

## 2014-12-05 DIAGNOSIS — N200 Calculus of kidney: Secondary | ICD-10-CM

## 2014-12-05 DIAGNOSIS — D3500 Benign neoplasm of unspecified adrenal gland: Secondary | ICD-10-CM | POA: Diagnosis not present

## 2014-12-13 ENCOUNTER — Encounter: Payer: Self-pay | Admitting: *Deleted

## 2014-12-13 ENCOUNTER — Telehealth: Payer: Self-pay | Admitting: Emergency Medicine

## 2014-12-13 ENCOUNTER — Telehealth: Payer: Self-pay | Admitting: Family Medicine

## 2014-12-13 NOTE — Telephone Encounter (Signed)
[phone] Pt gets her oxygen through [facility] and she states that there is a evaluation that is needing to be done in 90 days for her oxygen and she states she was just in here does she need to come back in for this. States that there are no changes.

## 2014-12-13 NOTE — Telephone Encounter (Signed)
This encounter was created in error - please disregard.

## 2014-12-13 NOTE — Telephone Encounter (Signed)
Call returned to patient.   States that she received letter from [facility] stating that she needs evaluation to continue to receive oxygen from [facility].   Advised that patient has pulmonologist [facility]) that would make determination for oxygen.   Call placed to [facility]. Advised that [facility] is sending office notes from 11/30/2014 that covers evaluation.

## 2014-12-13 NOTE — Telephone Encounter (Signed)
Called and spoke to pt. Pt stated she will need [place] to call [facility] regarding her O2, pt did not provide much information. Pt aso stated she has a desat episode where her O2 dropped tot he 80's. Questioned if pt was wearing her O2, pt denied wearing O2 when her she desat. Educated pt on the importance of wearing O2. Pt verbalized understanding and denied any further questions or concerns at this time.   Called and spoke to [place] at [facility]. Estill Bamberg stated they are needing a recent face-to-face. OV from 11/30/14 faxed to [facility] at [phone]. Estill Bamberg verbalized understanding and denied anything further needed.

## 2015-01-09 ENCOUNTER — Ambulatory Visit (INDEPENDENT_AMBULATORY_CARE_PROVIDER_SITE_OTHER): Payer: Medicare Other | Admitting: Emergency Medicine

## 2015-01-09 ENCOUNTER — Encounter: Payer: Self-pay | Admitting: Emergency Medicine

## 2015-01-09 VITALS — BP 120/86 | HR 102 | Temp 98.6°F | Ht 63.0 in | Wt 154.0 lb

## 2015-01-09 DIAGNOSIS — J302 Other seasonal allergic rhinitis: Secondary | ICD-10-CM

## 2015-01-09 MED ORDER — PREDNISONE 10 MG PO TABS: 10.0000 mg | ORAL_TABLET | Freq: Every day | ORAL | Status: DC

## 2015-01-09 MED ORDER — PROMETHAZINE-CODEINE 6.25-10 MG/5ML PO SYRP: 5.0000 mL | ORAL_SOLUTION | Freq: Four times a day (QID) | ORAL | Status: DC | PRN

## 2015-01-09 MED ORDER — POLYVINYL ALCOHOL-POVIDONE 5-6 MG/ML OP SOLN: 2.0000 [drp] | Freq: Every day | OPHTHALMIC | Status: DC | PRN

## 2015-01-09 NOTE — Patient Instructions (Addendum)
Please continue your current medications as you are taking them, including your prednisone once a day. We will refill the prednisone for you.  Follow with Dr Lamonte Sakai in 2 months.

## 2015-01-09 NOTE — Progress Notes (Signed)
Subjective:    Patient ID: Cindy [name], female    DOB: 07-May-1956, 59 y.o.   MRN: [id number]  HPI 59 yo woman, smoker (40pk-yrs), hyperlipidemia. Followed by Dr Buelah Manis and on Spiriva + Symbicort + ProAir prn, FEV1 19% predicted on prior PFT! She has had several exacerbations over the last year. Was first noted to be hypoxemic in 2010, is on O2 that she is using prn.  She has exertional wheeze, SOB. Coughs daily - productive white, thick.   ROV 09/27/12 -- follows up COPD, hypoxemia, on Spiriva/Symbicort. 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 Since last visit has been by Dr Buelah Manis and in the ED, treated with pred for chest tightness.  Uses albuterol every 6 hours. Hasn;t had a-1 AT yet to my knowledge.   ROV 12/01/12 -- COPD + hypoxemia, severe AFL. Returns for f/u. Continues to smoke 0.5-1.0 pk/day. About last 3 weeks, URI sx, nasal congestion, sore throat. Was treated by Dr Buelah Manis with augmentin and prednisone. Remains on Spiriva + Symbicort.   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 Has been seen in the ED and by Dr Buelah Manis several times since our last visit. She is finishing augmentin + pred now to treat recent AE + bronchitic sx. She continues to smoke > 1pk/day. Using SABA a few times a week. Improved cough, green sputum.   ROV 02/22/13 -- severe COPD, frequent AE's, hypoxemia. Active tobacco use. We started daliresp last time. She reports that she is doing OK, she was treated with abx/pred before our last visit 1/17. She reports constipation since last visit, ? From daliresp. She is smoking >1pk/day. She is asking for prednisone for her back pain, constipation.   ROV 04/22/13 -- Active smoker, Severe COPD, frequent AE's most recently in March (since our last appt), ? Due to allergies. Finished pred, started fluticasone. She is dealing with pain from compression fracture. Remains on symbicort and spiriva. She has also recently been  found to have L adrenal adenoma > to be seen by urology, [place].   ROV 06/14/13 -- Active smoker, Severe COPD, frequent AE's, allergic rhinitis. She was admitted for N/V, ? Renal stone/UTI, a COPD exac 6/1-6/2. She is feeling better, no evidence exacerbation right now. She is on Symbicort, Spiriva.   ROV 08/25/13 -- Active smoker, Severe COPD, frequent AE's, allergic rhinitis. She c/o worsening over the last week >  Having radicular LE pain, she has had more chest tightness this week and SOB. She saw Dr Buelah Manis last week and has been treated with augmentin+ pred taper. She has improved after rx for AE.   ROV 10/27/13 -- Active smoker, Severe COPD, frequent AE's, allergic rhinitis. Here for f/u. She tells me that she feels very well, just treated with prednisone + azithro for nasal congestion > finally improved. On Spiriva + Symbicort. Wears her O2 with some exertion but not all. Smoking 1 pk a day.   ROV 12/16/13 -- Active smoker > 1 pack/day, Severe COPD, frequent AE's, allergic rhinitis. She presents today for f/u. She developed some chest tightness, increased nasal congestion, cough with clear phlegm, possible chills, no documented fevers. She saw Dr Buelah Manis and was treated for an AE with solumedrol, prednisone and augmentin.   ROV 03/14/14 -- severe COPD, continues to smoke, allergies, freq AE's. She is currently being treated with pred + azithro, finishing today. This was her first flare since 12/16/13.  Still smokes a pack a  day. Discussed in detail today. She is willing to try to cut down. We talked about strategies today.   ROV 07/05/14 -- severe COPD, continues to smoke, allergies, freq AE's. She has been treated by Dr Buelah Manis x 2 for AE's with pred and abx. She had PFT today that show FEV1 0.68 - 0.81L (29-34% predicted). Her spiriva was changed to the respimat version. She is smoking 1 pk a day.   ROV 10/31/14 -- follow up visit for tobacco use, severe COPD rhinitis. She flares frequently.  Presents today with hoarseness, continued cough, just received azithro + pred taper.  She doesn't feel that she is over it yet - still with chest tightness.   ROV 11/30/14 -- follows up for COPD and continued tobacco use. She has a chronic bronchitis component and is seen frequently. Last month we treated her for an AE. She is improved today - she has had some headache and a tickle or pain in her upper chest. She has noticed some exertional desaturations. She has needed cough medication.   ROV 01/09/15 -- follow up visit for her COPD and tobacco use. She tells me that she is hanging on, but that she has SOB episodes, has to use her SABA a few times during the day. She doesn't feel that the symbicort lasts all 12 hours. No abx since last time. We started pred 10mg  qd last time. She is worried about the side effects. She may have benefited some, she at least hasn't had a flare.   PULMONARY FUNCTON TEST 09/27/2012  FVC 2.31  FEV1 .87  FEV1/FVC 37.7  FVC  % Predicted 76  FEV % Predicted 38  FeF 25-75 .3  FeF 25-75 % Predicted 2.65       Objective:   Physical Exam Filed Vitals:   01/09/15 1637  BP: 120/86  Pulse: 102  Temp: 98.6 F (37 C)  TempSrc: Oral  Height: 5\' 3"  (1.6 m)  Weight: 154 lb (69.854 kg)  SpO2: 90%   Gen: Pleasant, well-nourished, in no distress, very tangential, difficult history giver  ENT: No lesions,  mouth clear,  oropharynx clear, no postnasal drip  Neck: No JVD, no TMG, no carotid bruits, some mild UA exp noise  Lungs: No use of accessory muscles, B rhonchi, B exp wheezes.   Cardiovascular: RRR, heart sounds normal, no murmur or gallops, no peripheral edema  Musculoskeletal: No deformities, no cyanosis or clubbing  Neuro: alert, non focal  Skin: Warm, no lesions or rashes     Assessment & Plan:  COPD (chronic obstructive pulmonary disease) Severe COPD. She realizes that she needs to stop smoking but hasn't been able to work on this yet. We did start  prednisone 10 mg daily last visit to see if her symptoms would change. It's not entirely clear how much better she's been since we made the addition. She has not needed any bursts of prednisone or antibiotics since our last visit a month ago. She is concerned about side effects but I believe it would be best for [place] to continue for now. We will follow-up in 2 months. I refilled her cough syrup, recommend that she take artificial tears for her irritated eyes.

## 2015-01-09 NOTE — Assessment & Plan Note (Signed)
Severe COPD. She realizes that she needs to stop smoking but hasn't been able to work on this yet. We did start prednisone 10 mg daily last visit to see if her symptoms would change. It's not entirely clear how much better she's been since we made the addition. She has not needed any bursts of prednisone or antibiotics since our last visit a month ago. She is concerned about side effects but I believe it would be best for [place] to continue for now. We will follow-up in 2 months. I refilled her cough syrup, recommend that she take artificial tears for her irritated eyes.

## 2015-01-11 ENCOUNTER — Other Ambulatory Visit: Payer: Self-pay | Admitting: *Deleted

## 2015-02-08 ENCOUNTER — Encounter: Payer: Self-pay | Admitting: Physician Assistant

## 2015-02-08 ENCOUNTER — Ambulatory Visit (INDEPENDENT_AMBULATORY_CARE_PROVIDER_SITE_OTHER): Payer: Medicare Other | Admitting: Physician Assistant

## 2015-02-08 VITALS — BP 154/94 | HR 96 | Temp 98.2°F | Resp 20 | Wt 151.0 lb

## 2015-02-08 DIAGNOSIS — M25521 Pain in right elbow: Secondary | ICD-10-CM

## 2015-02-08 MED ORDER — MELOXICAM 7.5 MG PO TABS: 7.5000 mg | ORAL_TABLET | Freq: Every day | ORAL | Status: DC

## 2015-02-08 NOTE — Progress Notes (Signed)
Patient ID: Cindy [name] MRN: [id number], DOB: Jun 01, 1956, 59 y.o. Date of Encounter: 02/08/2015, 12:21 PM    Chief Complaint:  Chief Complaint  Patient presents with  . c/o rt elbow pain     HPI: 59 y.o. year old white female reports that she has been having this pain in her right elbow since Monday which was 02/05/15. Says that she has had no trauma or injury to the area. Has not fallen and has not banged the elbow on anything that she can recall. Says that she just occasionally will feel a twinge of pain. Says that there is no pain they're present all the time. She points to the olecranon process as the area of pain. On Monday she went and bought some icy packs and started applying those to the area that they have not provided much relief. I see that she has oxycodone on her medicine list but she says that she has not been using that. Says that she only using that very sparingly for severe pains.     Home Meds:   Outpatient Prescriptions Prior to Visit  Medication Sig Dispense Refill  . albuterol (PROAIR HFA) 108 (90 BASE) MCG/ACT inhaler Inhale 2 puffs into the lungs every 6 (six) hours as needed for wheezing or shortness of breath. 18 g 3  . albuterol (PROVENTIL) (2.5 MG/3ML) 0.083% nebulizer solution Take 3 mLs (2.5 mg total) by nebulization every 4 (four) hours as needed for wheezing or shortness of breath. 75 mL 11  . alendronate (FOSAMAX) 70 MG tablet Take 1 tablet (70 mg total) by mouth every 7 (seven) days. Take with a full glass of water on an empty stomach.(wednesday) 4 tablet 6  . aspirin EC 81 MG tablet Take 81 mg by mouth every morning.     . budesonide-formoterol (SYMBICORT) 160-4.5 MCG/ACT inhaler Inhale 2 puffs into the lungs 2 (two) times daily. 1 Inhaler 11  . Calcium Carbonate-Vitamin D (CALCIUM 600/VITAMIN D) 600-400 MG-UNIT per tablet Take 1 tablet by mouth 2 (two) times daily.    . Multiple Vitamin (MULITIVITAMIN WITH MINERALS) TABS Take 1 tablet by mouth every  morning.     Marland Kitchen oxyCODONE-acetaminophen (PERCOCET/ROXICET) 5-325 MG per tablet Take 1 tablet by mouth every 4 (four) hours as needed. 60 tablet 0  . pantoprazole (PROTONIX) 40 MG tablet take 1 tablet by mouth once daily 90 tablet 1  . Polyvinyl Alcohol-Povidone 5-6 MG/ML SOLN Apply 2 drops to eye daily as needed (allergy eye irritation). 15 mL 0  . predniSONE (DELTASONE) 10 MG tablet Take 1 tablet (10 mg total) by mouth daily. 30 tablet 1  . promethazine-codeine (PHENERGAN WITH CODEINE) 6.25-10 MG/5ML syrup Take 5 mLs by mouth every 6 (six) hours as needed for cough. 120 mL 0  . simvastatin (ZOCOR) 20 MG tablet Take 1 tablet (20 mg total) by mouth at bedtime. 90 tablet 1  . Tiotropium Bromide Monohydrate (SPIRIVA RESPIMAT) 2.5 MCG/ACT AERS Inhale 2 sprays into the lungs daily. 1 Inhaler 11   No facility-administered medications prior to visit.    Allergies:  Allergies  Allergen Reactions  . Keflex [Cephalexin] Anaphylaxis and Rash  . Doxycycline Other (See Comments)    Gave patient oral thrush  . Sulfa Antibiotics   . Avelox [Moxifloxacin Hcl In Nacl] Itching and Rash  . Toradol [Ketorolac Tromethamine] Swelling, Rash and Other (See Comments)    Bruising, swelling, rash at injection site      Review of Systems: See HPI for pertinent  ROS. All other ROS negative.    Physical Exam: Blood pressure 154/94, pulse 96, temperature 98.2 F (36.8 C), temperature source Oral, resp. rate 20, weight 151 lb (68.493 kg)., Body mass index is 26.76 kg/(m^2). General:  WF on nasal cannula oxygen. Appears in no acute distress. Neck: Supple. No thyromegaly. No lymphadenopathy. Lungs: Mild wheezes throughout bilaterally. Heart: Regular rhythm. No murmurs, rubs, or gallops. Msk:  Strength and tone normal for age. Right Elbow: She points to the olecranon process as the area of discomfort. Inspection of the olecranon is normal.  There is no erythema. There is no swelling. There is no fluid in the  olecranon bursa. There is no tenderness with palpation of the medial epicondyle. There is no tenderness with palpation of the lateral epicondyle. Extremities/Skin: Warm and dry.  Neuro: Alert and oriented X 3. Moves all extremities spontaneously. Gait is normal. CNII-XII grossly in tact. Psych:  Responds to questions appropriately with a normal affect.     ASSESSMENT AND PLAN:  59 y.o. year old female with  1. Right elbow pain Discussed obtaining x-ray but she defers. She also was that there is been no trauma or injury and there is no severe pain and no constant pain to suggest any type of fracture. There is no swelling or fluid or redness or warmth present to suggest olecranon bursitis. She is to make sure that when she takes the mobile she takes it with food. Follow-up if symptoms worsen. - meloxicam (MOBIC) 7.5 MG tablet; Take 1 tablet (7.5 mg total) by mouth daily.  Dispense: 30 tablet; Refill: 0   Signed, [address] [place], [place], [facility] 02/08/2015 12:21 PM

## 2015-02-13 ENCOUNTER — Ambulatory Visit: Payer: Medicare Other | Admitting: Family Medicine

## 2015-02-19 ENCOUNTER — Ambulatory Visit (INDEPENDENT_AMBULATORY_CARE_PROVIDER_SITE_OTHER): Payer: Medicare Other | Admitting: Family Medicine

## 2015-02-19 ENCOUNTER — Encounter: Payer: Self-pay | Admitting: Family Medicine

## 2015-02-19 VITALS — BP 138/82 | HR 78 | Temp 97.9°F | Resp 20 | Ht 63.0 in | Wt 152.0 lb

## 2015-02-19 DIAGNOSIS — E785 Hyperlipidemia, unspecified: Secondary | ICD-10-CM | POA: Diagnosis not present

## 2015-02-19 DIAGNOSIS — R7309 Other abnormal glucose: Secondary | ICD-10-CM | POA: Diagnosis not present

## 2015-02-19 DIAGNOSIS — J411 Mucopurulent chronic bronchitis: Secondary | ICD-10-CM

## 2015-02-19 DIAGNOSIS — Z720 Tobacco use: Secondary | ICD-10-CM | POA: Diagnosis not present

## 2015-02-19 DIAGNOSIS — R739 Hyperglycemia, unspecified: Secondary | ICD-10-CM

## 2015-02-19 DIAGNOSIS — T50905A Adverse effect of unspecified drugs, medicaments and biological substances, initial encounter: Secondary | ICD-10-CM

## 2015-02-19 LAB — CBC WITH DIFFERENTIAL/PLATELET
BASOS PCT: 0 % (ref 0–1)
Basophils Absolute: 0 10*3/uL (ref 0.0–0.1)
EOS ABS: 0.1 10*3/uL (ref 0.0–0.7)
Eosinophils Relative: 1 % (ref 0–5)
HCT: 45.6 % (ref 36.0–46.0)
HEMOGLOBIN: 14.6 g/dL (ref 12.0–15.0)
LYMPHS PCT: 25 % (ref 12–46)
Lymphs Abs: 3.3 10*3/uL (ref 0.7–4.0)
MCH: 31.1 pg (ref 26.0–34.0)
MCHC: 32 g/dL (ref 30.0–36.0)
MCV: 97.2 fL (ref 78.0–100.0)
MONO ABS: 1.2 10*3/uL — AB (ref 0.1–1.0)
MPV: 12.3 fL (ref 8.6–12.4)
Monocytes Relative: 9 % (ref 3–12)
NEUTROS ABS: 8.5 10*3/uL — AB (ref 1.7–7.7)
Neutrophils Relative %: 65 % (ref 43–77)
PLATELETS: 264 10*3/uL (ref 150–400)
RBC: 4.69 MIL/uL (ref 3.87–5.11)
RDW: 13.4 % (ref 11.5–15.5)
WBC: 13.1 10*3/uL — ABNORMAL HIGH (ref 4.0–10.5)

## 2015-02-19 LAB — COMPREHENSIVE METABOLIC PANEL
ALBUMIN: 4.1 g/dL (ref 3.5–5.2)
ALT: 16 U/L (ref 0–35)
AST: 16 U/L (ref 0–37)
Alkaline Phosphatase: 56 U/L (ref 39–117)
BILIRUBIN TOTAL: 0.3 mg/dL (ref 0.2–1.2)
BUN: 9 mg/dL (ref 6–23)
CALCIUM: 10.3 mg/dL (ref 8.4–10.5)
CO2: 31 mEq/L (ref 19–32)
CREATININE: 0.7 mg/dL (ref 0.50–1.10)
Chloride: 100 mEq/L (ref 96–112)
Glucose, Bld: 72 mg/dL (ref 70–99)
POTASSIUM: 3.9 meq/L (ref 3.5–5.3)
Sodium: 142 mEq/L (ref 135–145)
Total Protein: 6.4 g/dL (ref 6.0–8.3)

## 2015-02-19 LAB — HEMOGLOBIN A1C
HEMOGLOBIN A1C: 5.9 % — AB (ref <5.7)
MEAN PLASMA GLUCOSE: 123 mg/dL — AB (ref <117)

## 2015-02-19 LAB — LIPID PANEL
CHOL/HDL RATIO: 3.2 ratio
Cholesterol: 192 mg/dL (ref 0–200)
HDL: 60 mg/dL (ref >=46)
LDL Cholesterol: 97 mg/dL (ref 0–99)
Triglycerides: 175 mg/dL — ABNORMAL HIGH (ref <150)
VLDL: 35 mg/dL (ref 0–40)

## 2015-02-19 MED ORDER — POLYVINYL ALCOHOL-POVIDONE 5-6 MG/ML OP SOLN: 2.0000 [drp] | Freq: Every day | OPHTHALMIC | Status: DC | PRN

## 2015-02-19 NOTE — Assessment & Plan Note (Signed)
Severe COPD she is exhausted all of her medications she is on oxygen and is now on chronic prednisone

## 2015-02-19 NOTE — Assessment & Plan Note (Signed)
Recheck A1c as she is now on chronic prednisone

## 2015-02-19 NOTE — Progress Notes (Signed)
Patient ID: Cindy [name], female   DOB: 01-31-1956, 59 y.o.   MRN: [id number]   Subjective:    Patient ID: Cindy [name], female    DOB: 1956-09-19, 59 y.o.   MRN: [id number]  Patient presents for 3 month F/U and HA  patient here to follow chronic medical problems. She was seen by her pulmonologist and started on daily prednisone 10 mg secondary to recurrent COPD exacerbations. Unfortunately she continues to smoke and a daily basis. She states that she gets intermittent headaches at times however these are also times where she does not wear her oxygen she states that even taking a shower she feels like she needs her oxygen.  Her medications were reviewed. She is taking her Fosamax for her osteo porosis.  She is also taking cholesterol medicine as prescribed and is due for lipid panel.    Review Of Systems:  GEN- denies fatigue, fever, weight loss,weakness, recent illness HEENT- denies eye drainage, change in vision, nasal discharge, CVS- denies chest pain, +palpitations RESP-+ SOB, +cough, wheeze ABD- denies N/V, change in stools, abd pain GU- denies dysuria, hematuria, dribbling, incontinence MSK- + joint pain, muscle aches, injury Neuro- denies headache, dizziness, syncope, seizure activity       Objective:    BP 138/82 mmHg  Pulse 78  Temp(Src) 97.9 F (36.6 C) (Oral)  Resp 20  Ht 5\' 3"  (1.6 m)  Wt 152 lb (68.947 kg)  BMI 26.93 kg/m2 GEN- NAD, alert and oriented x3 HEENT- PERRL, EOMI, non injected sclera, pink conjunctiva, MMM, oropharynx clear Neck- Supple, no thyromegaly CVS- RRR, no murmur RESP-decreased air movement,scattered wheeze.oxygen 2 L Skin- small areas of bruising, thin skin EXT- No edema Pulses- Radial 2+        Assessment & Plan:      Problem List Items Addressed This Visit      Unprioritized   Tobacco abuse (Chronic)   Hyperlipidemia   Relevant Orders   CBC with Differential/Platelet   Comprehensive metabolic panel   Lipid panel   Hyperglycemia, drug-induced   Relevant Orders   Hemoglobin A1c   COPD (chronic obstructive pulmonary disease) - Primary      Note: This dictation was prepared with Dragon dictation along with smaller phrase technology. Any transcriptional errors that result from this process are unintentional.

## 2015-02-19 NOTE — Patient Instructions (Signed)
We will call with lab results You have to stop smoking Get the artificial tears  Take the mucinex twice a day  F/U 3 months

## 2015-02-19 NOTE — Assessment & Plan Note (Signed)
Unfortunately she continues to smoke despite her end-stage COPD

## 2015-03-27 ENCOUNTER — Telehealth: Payer: Self-pay | Admitting: Emergency Medicine

## 2015-03-27 MED ORDER — PREDNISONE 10 MG PO TABS: 10.0000 mg | ORAL_TABLET | Freq: Every day | ORAL | Status: DC

## 2015-03-27 NOTE — Telephone Encounter (Signed)
Rx has been sent in. Pt is aware. Nothing further was needed. 

## 2015-04-16 ENCOUNTER — Telehealth: Payer: Self-pay | Admitting: Family Medicine

## 2015-04-16 MED ORDER — PANTOPRAZOLE SODIUM 40 MG PO TBEC: DELAYED_RELEASE_TABLET | ORAL | Status: DC

## 2015-04-16 MED ORDER — BUDESONIDE-FORMOTEROL FUMARATE 160-4.5 MCG/ACT IN AERO: 2.0000 | INHALATION_SPRAY | Freq: Two times a day (BID) | RESPIRATORY_TRACT | Status: DC

## 2015-04-16 MED ORDER — ALENDRONATE SODIUM 70 MG PO TABS: 70.0000 mg | ORAL_TABLET | ORAL | Status: DC

## 2015-04-16 NOTE — Telephone Encounter (Signed)
Refill appropriate and filled per protocol. 

## 2015-04-16 NOTE — Telephone Encounter (Signed)
[phone] PT is needing a refill on pantoprazole (PROTONIX) 40 MG tablet (90 day supply) budesonide-formoterol (SYMBICORT) 160-4.5 MCG/ACT inhaler(Expired) alendronate (FOSAMAX) 70 MG tablet (she will need week after next) [employer]

## 2015-05-08 ENCOUNTER — Encounter: Payer: Self-pay | Admitting: Emergency Medicine

## 2015-05-08 ENCOUNTER — Ambulatory Visit (INDEPENDENT_AMBULATORY_CARE_PROVIDER_SITE_OTHER): Payer: Medicare Other | Admitting: Emergency Medicine

## 2015-05-08 VITALS — BP 124/80 | HR 112 | Ht 63.0 in | Wt 154.0 lb

## 2015-05-08 DIAGNOSIS — J411 Mucopurulent chronic bronchitis: Secondary | ICD-10-CM

## 2015-05-08 NOTE — Progress Notes (Signed)
Subjective:    Patient ID: Cindy [name], female    DOB: 10-28-1956, 59 y.o.   MRN: [id number]  HPI 59 yo woman, smoker (40pk-yrs), hyperlipidemia. Followed by Dr Buelah Manis and on Spiriva + Symbicort + ProAir prn, FEV1 19% predicted on prior PFT! She has had several exacerbations over the last year. Was first noted to be hypoxemic in 2010, is on O2 that she is using prn.  She has exertional wheeze, SOB. Coughs daily - productive white, thick.   ROV 09/27/12 -- follows up COPD, hypoxemia, on Spiriva/Symbicort. 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 Since last visit has been by Dr Buelah Manis and in the ED, treated with pred for chest tightness.  Uses albuterol every 6 hours. Hasn;t had a-1 AT yet to my knowledge.   ROV 12/01/12 -- COPD + hypoxemia, severe AFL. Returns for f/u. Continues to smoke 0.5-1.0 pk/day. About last 3 weeks, URI sx, nasal congestion, sore throat. Was treated by Dr Buelah Manis with augmentin and prednisone. Remains on Spiriva + Symbicort.   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 Has been seen in the ED and by Dr Buelah Manis several times since our last visit. She is finishing augmentin + pred now to treat recent AE + bronchitic sx. She continues to smoke > 1pk/day. Using SABA a few times a week. Improved cough, green sputum.   ROV 02/22/13 -- severe COPD, frequent AE's, hypoxemia. Active tobacco use. We started daliresp last time. She reports that she is doing OK, she was treated with abx/pred before our last visit 1/17. She reports constipation since last visit, ? From daliresp. She is smoking >1pk/day. She is asking for prednisone for her back pain, constipation.   ROV 04/22/13 -- Active smoker, Severe COPD, frequent AE's most recently in March (since our last appt), ? Due to allergies. Finished pred, started fluticasone. She is dealing with pain from compression fracture. Remains on symbicort and spiriva. She has also recently been  found to have L adrenal adenoma > to be seen by urology, [place].   ROV 06/14/13 -- Active smoker, Severe COPD, frequent AE's, allergic rhinitis. She was admitted for N/V, ? Renal stone/UTI, a COPD exac 6/1-6/2. She is feeling better, no evidence exacerbation right now. She is on Symbicort, Spiriva.   ROV 08/25/13 -- Active smoker, Severe COPD, frequent AE's, allergic rhinitis. She c/o worsening over the last week >  Having radicular LE pain, she has had more chest tightness this week and SOB. She saw Dr Buelah Manis last week and has been treated with augmentin+ pred taper. She has improved after rx for AE.   ROV 10/27/13 -- Active smoker, Severe COPD, frequent AE's, allergic rhinitis. Here for f/u. She tells me that she feels very well, just treated with prednisone + azithro for nasal congestion > finally improved. On Spiriva + Symbicort. Wears her O2 with some exertion but not all. Smoking 1 pk a day.   ROV 12/16/13 -- Active smoker > 1 pack/day, Severe COPD, frequent AE's, allergic rhinitis. She presents today for f/u. She developed some chest tightness, increased nasal congestion, cough with clear phlegm, possible chills, no documented fevers. She saw Dr Buelah Manis and was treated for an AE with solumedrol, prednisone and augmentin.   ROV 03/14/14 -- severe COPD, continues to smoke, allergies, freq AE's. She is currently being treated with pred + azithro, finishing today. This was her first flare since 12/16/13.  Still smokes a pack a  day. Discussed in detail today. She is willing to try to cut down. We talked about strategies today.   ROV 07/05/14 -- severe COPD, continues to smoke, allergies, freq AE's. She has been treated by Dr Buelah Manis x 2 for AE's with pred and abx. She had PFT today that show FEV1 0.68 - 0.81L (29-34% predicted). Her spiriva was changed to the respimat version. She is smoking 1 pk a day.   ROV 10/31/14 -- follow up visit for tobacco use, severe COPD rhinitis. She flares frequently.  Presents today with hoarseness, continued cough, just received azithro + pred taper.  She doesn't feel that she is over it yet - still with chest tightness.   ROV 11/30/14 -- follows up for COPD and continued tobacco use. She has a chronic bronchitis component and is seen frequently. Last month we treated her for an AE. She is improved today - she has had some headache and a tickle or pain in her upper chest. She has noticed some exertional desaturations. She has needed cough medication.   ROV 01/09/15 -- follow up visit for her COPD and tobacco use. She tells me that she is hanging on, but that she has SOB episodes, has to use her SABA a few times during the day. She doesn't feel that the symbicort lasts all 12 hours. No abx since last time. We started pred 10mg  qd last time. She is worried about the side effects. She may have benefited some, she at least hasn't had a flare.  ROV 05/08/15 -- Follow-up visit for tobacco use and COPD.  She is still smoking, 1 pk/day. She is on spiriva and symbicort. She uses albuterol HFA and nebs prn, about 1-2x a week. Prednisone 10mg  daily since Nov '15. It has seemed to help her.    PULMONARY FUNCTON TEST 09/27/2012  FVC 2.31  FEV1 .87  FEV1/FVC 37.7  FVC  % Predicted 76  FEV % Predicted 38  FeF 25-75 .3  FeF 25-75 % Predicted 2.65       Objective:   Physical Exam Filed Vitals:   05/08/15 1202  BP: 124/80  Pulse: 112  Height: 5\' 3"  (1.6 m)  Weight: 154 lb (69.854 kg)  SpO2: 94%   Gen: Pleasant, well-nourished, in no distress,   ENT: No lesions,  mouth clear,  oropharynx clear, no postnasal drip  Neck: No JVD, no TMG, no carotid bruits, no stridor  Lungs: No use of accessory muscles, B rhonchi, B exp wheezes.   Cardiovascular: RRR, heart sounds normal, no murmur or gallops, no peripheral edema  Musculoskeletal: No deformities, no cyanosis or clubbing  Neuro: alert, non focal  Skin: Warm, no lesions or rashes     Assessment & Plan:  COPD  (chronic obstructive pulmonary disease) We reviewed her symptoms and discussed all of her medications in detail. We are really at a standstill given her continued tobacco use. She understands this. I asked her to work on decreasing and she will do so. She is not using her oxygen with all exertion and I do believe that she would benefit from this. We discussed this today. We will continue prednisone 10 mg daily. I don't believe there is any benefit to increasing while she still smoking. We will continue Symbicort and Spiriva, albuterol as needed. rov 3

## 2015-05-08 NOTE — Patient Instructions (Signed)
Please continue your Spiriva and Symbicort as had been taking them Wear your oxygen with all exertion Continue prednisone 10 mg daily Please try to work hard on decreasing your cigarette smoking Use albuterol either your inhaler for your nebulizer up to every 4 hours if needed for shortness of breath Follow with Dr Lamonte Sakai in 3 months or sooner if you have any problems.

## 2015-05-08 NOTE — Assessment & Plan Note (Signed)
We reviewed her symptoms and discussed all of her medications in detail. We are really at a standstill given her continued tobacco use. She understands this. I asked her to work on decreasing and she will do so. She is not using her oxygen with all exertion and I do believe that she would benefit from this. We discussed this today. We will continue prednisone 10 mg daily. I don't believe there is any benefit to increasing while she still smoking. We will continue Symbicort and Spiriva, albuterol as needed. rov 3

## 2015-05-14 ENCOUNTER — Telehealth: Payer: Self-pay | Admitting: Family Medicine

## 2015-05-14 NOTE — Telephone Encounter (Signed)
Small itchy tick bite.  Not even size of dime.  Not infected, just itchy.  Told keep site clean and dry.  If red area becomes larger of appears infected. We will need to see.

## 2015-05-21 ENCOUNTER — Encounter: Payer: Self-pay | Admitting: Family Medicine

## 2015-05-21 ENCOUNTER — Ambulatory Visit (INDEPENDENT_AMBULATORY_CARE_PROVIDER_SITE_OTHER): Payer: Medicare Other | Admitting: Family Medicine

## 2015-05-21 VITALS — BP 138/68 | HR 96 | Temp 98.3°F | Resp 22 | Ht 63.0 in | Wt 154.0 lb

## 2015-05-21 DIAGNOSIS — E785 Hyperlipidemia, unspecified: Secondary | ICD-10-CM | POA: Diagnosis not present

## 2015-05-21 DIAGNOSIS — M810 Age-related osteoporosis without current pathological fracture: Secondary | ICD-10-CM | POA: Diagnosis not present

## 2015-05-21 DIAGNOSIS — Z23 Encounter for immunization: Secondary | ICD-10-CM

## 2015-05-21 DIAGNOSIS — Z720 Tobacco use: Secondary | ICD-10-CM

## 2015-05-21 DIAGNOSIS — S61419A Laceration without foreign body of unspecified hand, initial encounter: Secondary | ICD-10-CM | POA: Insufficient documentation

## 2015-05-21 DIAGNOSIS — S61412A Laceration without foreign body of left hand, initial encounter: Secondary | ICD-10-CM | POA: Diagnosis not present

## 2015-05-21 DIAGNOSIS — J411 Mucopurulent chronic bronchitis: Secondary | ICD-10-CM | POA: Diagnosis not present

## 2015-05-21 NOTE — Assessment & Plan Note (Signed)
Continue fosamax, will plan for repeat bone density due to high risk medication this year, she may need infusion if this has worsened Continue calcium and vitamin D as welll

## 2015-05-21 NOTE — Assessment & Plan Note (Signed)
Reviewed pulmonary note, max therapy

## 2015-05-21 NOTE — Assessment & Plan Note (Signed)
Uncomplicated laceration but with unclear wire, needs TDAP today

## 2015-05-21 NOTE — Assessment & Plan Note (Signed)
Unchanged, no ready to quit

## 2015-05-21 NOTE — Patient Instructions (Addendum)
Tetanus Booster given Continue current medications F/U 4 months for PHYSICAL- Come Fasting

## 2015-05-21 NOTE — Assessment & Plan Note (Signed)
Recent lipid at goal, continue statin

## 2015-05-21 NOTE — Progress Notes (Signed)
Patient ID: LEEYAH HEATHER, female   DOB: 08/25/1956, 59 y.o.   MRN: [id number]   Subjective:    Patient ID: CARROL HOUGLAND, female    DOB: [birth year], 60 y.o.   MRN: [id number]  Patient presents for 3 month F/U  Pt here to f/u seen by pulmonary on daily prednisone to help with recurrent excerbations she is maxed out on medical therapy for her End Stage COPD, continues to smoke 1ppd. States that she bruises easily because of the prednisone. She cut her self on barbed wire 1 week ago, TDAP due, use TAC cream to heal it.   Hyperlipidemia- at goal with zocor   Osteoporosos- on fosamax, chronic prednisone will continue to deteriorate her bones   Kidney stones, compression fracture, OA- very rare use of pain medication,she did not want refill today   Pulled tick off 1 week ago, behind right knee  Review Of Systems:  GEN- denies fatigue, fever, weight loss,weakness, recent illness HEENT- denies eye drainage, change in vision, nasal discharge, CVS- denies chest pain, palpitations RESP- +SOB, +cough, +wheeze ABD- denies N/V, change in stools, abd pain GU- denies dysuria, hematuria, dribbling, incontinence MSK- denies joint pain, muscle aches, injury Neuro- denies headache, dizziness, syncope, seizure activity       Objective:    BP 138/68 mmHg  Pulse 96  Temp(Src) 98.3 F (36.8 C) (Oral)  Resp 22  Ht 5\' 3"  (1.6 m)  Wt 154 lb (69.854 kg)  BMI 27.29 kg/m2  SpO2 98% GEN- NAD, alert and oriented x3 HEENT- PERRL, EOMI, non injected sclera, pink conjunctiva, MMM, oropharynx clear CVS- RRR, no murmur RESP-bilateral wheeze, no rales, normal WOB, oxygen 2 Liters  Skin- bruising on arms, left hand- linear laceration, d/c/i, lEFT post thigh scab, no in place, mild erythema around scab, no fluctuance.  EXT- No edema Pulses- Radial, DP- 2+        Assessment & Plan:      Problem List Items Addressed This Visit    Tobacco abuse (Chronic)   Osteoporosis   Hyperlipidemia   COPD (chronic  obstructive pulmonary disease) - Primary      Note: This dictation was prepared with Dragon dictation along with smaller phrase technology. Any transcriptional errors that result from this process are unintentional.

## 2015-05-30 ENCOUNTER — Telehealth: Payer: Self-pay | Admitting: Family Medicine

## 2015-05-30 MED ORDER — ALBUTEROL SULFATE HFA 108 (90 BASE) MCG/ACT IN AERS: 2.0000 | INHALATION_SPRAY | Freq: Four times a day (QID) | RESPIRATORY_TRACT | Status: DC | PRN

## 2015-05-30 NOTE — Telephone Encounter (Addendum)
Patient needs a prescription called into her pharmacy [facility] in [place]  for her proair. She also states that she normally takes her fosamax on Wednesday however she didn't have it this morning so she called the pharmacy and is going to pick it up today and will take in the morning. (this is just an [place])

## 2015-05-30 NOTE — Telephone Encounter (Signed)
Prescription sent to pharmacy.   FYI noted.

## 2015-07-03 ENCOUNTER — Telehealth: Payer: Self-pay | Admitting: *Deleted

## 2015-07-03 DIAGNOSIS — E785 Hyperlipidemia, unspecified: Secondary | ICD-10-CM

## 2015-07-03 MED ORDER — SIMVASTATIN 20 MG PO TABS: 20.0000 mg | ORAL_TABLET | Freq: Every day | ORAL | Status: DC

## 2015-07-03 NOTE — Telephone Encounter (Signed)
Received fax from pt pharmacy requesting a 90 day supply of simvastatin 20mg  I reviewed pts chart and refilled her script

## 2015-07-04 ENCOUNTER — Other Ambulatory Visit: Payer: Self-pay | Admitting: Family Medicine

## 2015-07-04 DIAGNOSIS — E785 Hyperlipidemia, unspecified: Secondary | ICD-10-CM

## 2015-07-04 MED ORDER — SIMVASTATIN 20 MG PO TABS: 20.0000 mg | ORAL_TABLET | Freq: Every day | ORAL | Status: DC

## 2015-07-04 NOTE — Telephone Encounter (Signed)
Medication refilled per protocol. 

## 2015-08-13 ENCOUNTER — Other Ambulatory Visit: Payer: Self-pay | Admitting: Family Medicine

## 2015-08-13 DIAGNOSIS — J441 Chronic obstructive pulmonary disease with (acute) exacerbation: Secondary | ICD-10-CM

## 2015-08-13 DIAGNOSIS — K219 Gastro-esophageal reflux disease without esophagitis: Secondary | ICD-10-CM

## 2015-08-13 MED ORDER — PANTOPRAZOLE SODIUM 40 MG PO TBEC: DELAYED_RELEASE_TABLET | ORAL | Status: DC

## 2015-08-13 MED ORDER — TIOTROPIUM BROMIDE MONOHYDRATE 2.5 MCG/ACT IN AERS: 2.0000 | INHALATION_SPRAY | Freq: Every day | RESPIRATORY_TRACT | Status: DC

## 2015-09-04 ENCOUNTER — Ambulatory Visit (INDEPENDENT_AMBULATORY_CARE_PROVIDER_SITE_OTHER): Payer: Medicare Other | Admitting: Emergency Medicine

## 2015-09-04 ENCOUNTER — Telehealth: Payer: Self-pay | Admitting: Emergency Medicine

## 2015-09-04 ENCOUNTER — Encounter: Payer: Self-pay | Admitting: Emergency Medicine

## 2015-09-04 ENCOUNTER — Other Ambulatory Visit: Payer: Self-pay | Admitting: Internal Medicine

## 2015-09-04 VITALS — BP 138/96 | HR 95 | Ht 63.0 in | Wt 157.0 lb

## 2015-09-04 DIAGNOSIS — J411 Mucopurulent chronic bronchitis: Secondary | ICD-10-CM | POA: Diagnosis not present

## 2015-09-04 MED ORDER — PROMETHAZINE-CODEINE 6.25-10 MG/5ML PO SYRP: 5.0000 mL | ORAL_SOLUTION | Freq: Four times a day (QID) | ORAL | Status: DC | PRN

## 2015-09-04 MED ORDER — HYDROCODONE-HOMATROPINE 5-1.5 MG/5ML PO SYRP: 5.0000 mL | ORAL_SOLUTION | Freq: Four times a day (QID) | ORAL | Status: DC | PRN

## 2015-09-04 NOTE — Progress Notes (Signed)
Subjective:    Patient ID: Cindy [name], female    DOB: 10-28-1956, 59 y.o.   MRN: [id number]  HPI 59 yo woman, smoker (40pk-yrs), hyperlipidemia. Followed by Dr Buelah Manis and on Spiriva + Symbicort + ProAir prn, FEV1 19% predicted on prior PFT! She has had several exacerbations over the last year. Was first noted to be hypoxemic in 2010, is on O2 that she is using prn.  She has exertional wheeze, SOB. Coughs daily - productive white, thick.   ROV 09/27/12 -- follows up COPD, hypoxemia, on Spiriva/Symbicort. 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 Since last visit has been by Dr Buelah Manis and in the ED, treated with pred for chest tightness.  Uses albuterol every 6 hours. Hasn;t had a-1 AT yet to my knowledge.   ROV 12/01/12 -- COPD + hypoxemia, severe AFL. Returns for f/u. Continues to smoke 0.5-1.0 pk/day. About last 3 weeks, URI sx, nasal congestion, sore throat. Was treated by Dr Buelah Manis with augmentin and prednisone. Remains on Spiriva + Symbicort.   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 Has been seen in the ED and by Dr Buelah Manis several times since our last visit. She is finishing augmentin + pred now to treat recent AE + bronchitic sx. She continues to smoke > 1pk/day. Using SABA a few times a week. Improved cough, green sputum.   ROV 02/22/13 -- severe COPD, frequent AE's, hypoxemia. Active tobacco use. We started daliresp last time. She reports that she is doing OK, she was treated with abx/pred before our last visit 1/17. She reports constipation since last visit, ? From daliresp. She is smoking >1pk/day. She is asking for prednisone for her back pain, constipation.   ROV 04/22/13 -- Active smoker, Severe COPD, frequent AE's most recently in March (since our last appt), ? Due to allergies. Finished pred, started fluticasone. She is dealing with pain from compression fracture. Remains on symbicort and spiriva. She has also recently been  found to have L adrenal adenoma > to be seen by urology, [place].   ROV 06/14/13 -- Active smoker, Severe COPD, frequent AE's, allergic rhinitis. She was admitted for N/V, ? Renal stone/UTI, a COPD exac 6/1-6/2. She is feeling better, no evidence exacerbation right now. She is on Symbicort, Spiriva.   ROV 08/25/13 -- Active smoker, Severe COPD, frequent AE's, allergic rhinitis. She c/o worsening over the last week >  Having radicular LE pain, she has had more chest tightness this week and SOB. She saw Dr Buelah Manis last week and has been treated with augmentin+ pred taper. She has improved after rx for AE.   ROV 10/27/13 -- Active smoker, Severe COPD, frequent AE's, allergic rhinitis. Here for f/u. She tells me that she feels very well, just treated with prednisone + azithro for nasal congestion > finally improved. On Spiriva + Symbicort. Wears her O2 with some exertion but not all. Smoking 1 pk a day.   ROV 12/16/13 -- Active smoker > 1 pack/day, Severe COPD, frequent AE's, allergic rhinitis. She presents today for f/u. She developed some chest tightness, increased nasal congestion, cough with clear phlegm, possible chills, no documented fevers. She saw Dr Buelah Manis and was treated for an AE with solumedrol, prednisone and augmentin.   ROV 03/14/14 -- severe COPD, continues to smoke, allergies, freq AE's. She is currently being treated with pred + azithro, finishing today. This was her first flare since 12/16/13.  Still smokes a pack a  day. Discussed in detail today. She is willing to try to cut down. We talked about strategies today.   ROV 07/05/14 -- severe COPD, continues to smoke, allergies, freq AE's. She has been treated by Dr Buelah Manis x 2 for AE's with pred and abx. She had PFT today that show FEV1 0.68 - 0.81L (29-34% predicted). Her spiriva was changed to the respimat version. She is smoking 1 pk a day.   ROV 10/31/14 -- follow up visit for tobacco use, severe COPD rhinitis. She flares frequently.  Presents today with hoarseness, continued cough, just received azithro + pred taper.  She doesn't feel that she is over it yet - still with chest tightness.   ROV 11/30/14 -- follows up for COPD and continued tobacco use. She has a chronic bronchitis component and is seen frequently. Last month we treated her for an AE. She is improved today - she has had some headache and a tickle or pain in her upper chest. She has noticed some exertional desaturations. She has needed cough medication.   ROV 01/09/15 -- follow up visit for her COPD and tobacco use. She tells me that she is hanging on, but that she has SOB episodes, has to use her SABA a few times during the day. She doesn't feel that the symbicort lasts all 12 hours. No abx since last time. We started pred 10mg  qd last time. She is worried about the side effects. She may have benefited some, she at least hasn't had a flare.  ROV 05/08/15 -- Follow-up visit for tobacco use and COPD.  She is still smoking, 1 pk/day. She is on spiriva and symbicort. She uses albuterol HFA and nebs prn, about 1-2x a week. Prednisone 10mg  daily since Nov '15. It has seemed to help her.   ROV 09/04/15 -- follow-up visit for tobacco use and severe COPD. I have had her on chronic prednisone, currently 10 mg daily. She is also using Spiriva and Symbicort.  She is having significant exertional SOB with any activity. She has been using her O2 with exertion. She is having daily cough, brings up thick mucous, no purulence. No change in her cigarettes - 1pk/day. Has not required any extra pred or abx since our last visit.    PULMONARY FUNCTON TEST 09/27/2012  FVC 2.31  FEV1 .87  FEV1/FVC 37.7  FVC  % Predicted 76  FEV % Predicted 38  FeF 25-75 .3  FeF 25-75 % Predicted 2.65       Objective:   Physical Exam Filed Vitals:   09/04/15 0959  BP: 138/96  Pulse: 95  Height: 5\' 3"  (1.6 m)  Weight: 157 lb (71.215 kg)  SpO2: 97%   Gen: Pleasant, well-nourished, in no distress,    ENT: No lesions,  mouth clear,  oropharynx clear, no postnasal drip  Neck: No JVD, no TMG, no carotid bruits, no stridor  Lungs: No use of accessory muscles, B rhonchi, B exp wheezes.   Cardiovascular: RRR, heart sounds normal, no murmur or gallops, no peripheral edema  Musculoskeletal: No deformities, no cyanosis or clubbing  Neuro: alert, non focal  Skin: Warm, no lesions or rashes     Assessment & Plan:  COPD (chronic obstructive pulmonary disease) Please continue your Spiriva and Symbicort as you are taking them  Take albuterol 2 puffs up to every 4 hours if needed for shortness of breath.  Continue your prednisone 10mg  daily Continue to work hard on decreasing your cigarettes.  Use hycodan as needed for  your cough.  Follow with Dr Lamonte Sakai in 4 months or sooner if you have any problems.

## 2015-09-04 NOTE — Patient Instructions (Signed)
Please continue your Spiriva and Symbicort as you are taking them  Take albuterol 2 puffs up to every 4 hours if needed for shortness of breath.  Continue your prednisone 10mg  daily Continue to work hard on decreasing your cigarettes.  Use hycodan as needed for your cough.  Follow with Dr Lamonte Sakai in 4 months or sooner if you have any problems.

## 2015-09-04 NOTE — Assessment & Plan Note (Signed)
Please continue your Spiriva and Symbicort as you are taking them  Take albuterol 2 puffs up to every 4 hours if needed for shortness of breath.  Continue your prednisone 10mg  daily Continue to work hard on decreasing your cigarettes.  Use hycodan as needed for your cough.  Follow with Dr Lamonte Sakai in 4 months or sooner if you have any problems.

## 2015-09-04 NOTE — Telephone Encounter (Signed)
Called and spoke with pt  Pt stated that hycodan cough syrup is not covered by her insurance Pt requesting another medication that would be covered   Spoke with Dr Lamonte Sakai about medicatio Per RB, call in phenergan with codeine syrup to pt's pharmacy with instruction of 31ml PO q 6 hour prn cough  Order called into pt's pharmacy   Called pt back and informed that medication was called in  Nothing further is needed

## 2015-09-25 ENCOUNTER — Encounter: Payer: Medicare Other | Admitting: Family Medicine

## 2015-09-25 ENCOUNTER — Ambulatory Visit: Payer: Medicare Other

## 2015-09-25 ENCOUNTER — Encounter: Payer: Self-pay | Admitting: Family Medicine

## 2015-09-25 ENCOUNTER — Ambulatory Visit (INDEPENDENT_AMBULATORY_CARE_PROVIDER_SITE_OTHER): Payer: Medicare Other | Admitting: Family Medicine

## 2015-09-25 VITALS — BP 132/74 | HR 84 | Temp 97.9°F | Resp 18 | Ht 63.0 in | Wt 158.0 lb

## 2015-09-25 DIAGNOSIS — E785 Hyperlipidemia, unspecified: Secondary | ICD-10-CM | POA: Diagnosis not present

## 2015-09-25 DIAGNOSIS — Z23 Encounter for immunization: Secondary | ICD-10-CM | POA: Diagnosis not present

## 2015-09-25 DIAGNOSIS — Z720 Tobacco use: Secondary | ICD-10-CM | POA: Diagnosis not present

## 2015-09-25 DIAGNOSIS — Z124 Encounter for screening for malignant neoplasm of cervix: Secondary | ICD-10-CM | POA: Diagnosis not present

## 2015-09-25 DIAGNOSIS — Z0000 Encounter for general adult medical examination without abnormal findings: Secondary | ICD-10-CM | POA: Diagnosis not present

## 2015-09-25 DIAGNOSIS — R7309 Other abnormal glucose: Secondary | ICD-10-CM | POA: Diagnosis not present

## 2015-09-25 DIAGNOSIS — Z1159 Encounter for screening for other viral diseases: Secondary | ICD-10-CM

## 2015-09-25 DIAGNOSIS — T50905A Adverse effect of unspecified drugs, medicaments and biological substances, initial encounter: Secondary | ICD-10-CM

## 2015-09-25 DIAGNOSIS — R739 Hyperglycemia, unspecified: Secondary | ICD-10-CM

## 2015-09-25 DIAGNOSIS — K219 Gastro-esophageal reflux disease without esophagitis: Secondary | ICD-10-CM | POA: Diagnosis not present

## 2015-09-25 DIAGNOSIS — J411 Mucopurulent chronic bronchitis: Secondary | ICD-10-CM | POA: Diagnosis not present

## 2015-09-25 LAB — CBC WITH DIFFERENTIAL/PLATELET
BASOS PCT: 1 % (ref 0–1)
Basophils Absolute: 0.1 10*3/uL (ref 0.0–0.1)
EOS ABS: 0.1 10*3/uL (ref 0.0–0.7)
EOS PCT: 1 % (ref 0–5)
HCT: 45.8 % (ref 36.0–46.0)
Hemoglobin: 15.2 g/dL — ABNORMAL HIGH (ref 12.0–15.0)
Lymphocytes Relative: 32 % (ref 12–46)
Lymphs Abs: 4.2 10*3/uL — ABNORMAL HIGH (ref 0.7–4.0)
MCH: 31.1 pg (ref 26.0–34.0)
MCHC: 33.2 g/dL (ref 30.0–36.0)
MCV: 93.7 fL (ref 78.0–100.0)
MPV: 11.3 fL (ref 8.6–12.4)
Monocytes Absolute: 1.2 10*3/uL — ABNORMAL HIGH (ref 0.1–1.0)
Monocytes Relative: 9 % (ref 3–12)
Neutro Abs: 7.5 10*3/uL (ref 1.7–7.7)
Neutrophils Relative %: 57 % (ref 43–77)
PLATELETS: 253 10*3/uL (ref 150–400)
RBC: 4.89 MIL/uL (ref 3.87–5.11)
RDW: 13.4 % (ref 11.5–15.5)
WBC: 13.2 10*3/uL — AB (ref 4.0–10.5)

## 2015-09-25 LAB — LIPID PANEL
CHOL/HDL RATIO: 3 ratio (ref <=5.0)
Cholesterol: 198 mg/dL (ref 125–200)
HDL: 65 mg/dL (ref >=46)
LDL CALC: 107 mg/dL (ref <130)
Triglycerides: 130 mg/dL (ref <150)
VLDL: 26 mg/dL (ref <30)

## 2015-09-25 LAB — COMPREHENSIVE METABOLIC PANEL
ALBUMIN: 4.1 g/dL (ref 3.6–5.1)
ALK PHOS: 54 U/L (ref 33–130)
ALT: 21 U/L (ref 6–29)
AST: 20 U/L (ref 10–35)
BUN: 11 mg/dL (ref 7–25)
CO2: 34 mmol/L — ABNORMAL HIGH (ref 20–31)
CREATININE: 0.84 mg/dL (ref 0.50–1.05)
Calcium: 9.7 mg/dL (ref 8.6–10.4)
Chloride: 100 mmol/L (ref 98–110)
Glucose, Bld: 67 mg/dL — ABNORMAL LOW (ref 70–99)
Potassium: 3.8 mmol/L (ref 3.5–5.3)
Sodium: 141 mmol/L (ref 135–146)
TOTAL PROTEIN: 6.4 g/dL (ref 6.1–8.1)
Total Bilirubin: 0.5 mg/dL (ref 0.2–1.2)

## 2015-09-25 MED ORDER — OXYCODONE-ACETAMINOPHEN 5-325 MG PO TABS: 1.0000 | ORAL_TABLET | Freq: Three times a day (TID) | ORAL | Status: DC | PRN

## 2015-09-25 MED ORDER — SIMVASTATIN 20 MG PO TABS: 20.0000 mg | ORAL_TABLET | Freq: Every day | ORAL | Status: DC

## 2015-09-25 MED ORDER — ALBUTEROL SULFATE HFA 108 (90 BASE) MCG/ACT IN AERS
2.0000 | INHALATION_SPRAY | Freq: Four times a day (QID) | RESPIRATORY_TRACT | Status: DC | PRN
Start: 2015-09-25 — End: 2016-02-19

## 2015-09-25 MED ORDER — PANTOPRAZOLE SODIUM 40 MG PO TBEC: DELAYED_RELEASE_TABLET | ORAL | Status: DC

## 2015-09-25 MED ORDER — ALENDRONATE SODIUM 70 MG PO TABS: 70.0000 mg | ORAL_TABLET | ORAL | Status: DC

## 2015-09-25 NOTE — Assessment & Plan Note (Signed)
Continue Protonix at the current dose

## 2015-09-25 NOTE — Patient Instructions (Addendum)
Use the topical anti-inflammatory-- asprecreme or biofreeze Use pain medication as needed We will call with lab results Flu shot given F/U 4 months

## 2015-09-25 NOTE — Assessment & Plan Note (Signed)
Reviewed pulmonary note  she is near end-stage,  She is on oxygen no other medication changes that can me given. She continues to smoke

## 2015-09-25 NOTE — Progress Notes (Deleted)
Patient ID: Cindy [name], female   DOB: Mar 03, 1956, 59 y.o.   MRN: [id number]   Subjective:    Patient ID: Cindy [name], female    DOB: 1956/05/26, 59 y.o.   MRN: [id number]  Patient presents for CPE with PAP     Review Of Systems:  GEN- denies fatigue, fever, weight loss,weakness, recent illness HEENT- denies eye drainage, change in vision, nasal discharge, CVS- denies chest pain, palpitations RESP- denies SOB, cough, wheeze ABD- denies N/V, change in stools, abd pain GU- denies dysuria, hematuria, dribbling, incontinence MSK- denies joint pain, muscle aches, injury Neuro- denies headache, dizziness, syncope, seizure activity       Objective:    BP 132/74 mmHg  Pulse 84  Temp(Src) 97.9 F (36.6 C) (Oral)  Resp 18  Ht 5\' 3"  (1.6 m)  Wt 158 lb (71.668 kg)  BMI 28.00 kg/m2  SpO2 98% GEN- NAD, alert and oriented x3 HEENT- PERRL, EOMI, non injected sclera, pink conjunctiva, MMM, oropharynx clear Neck- Supple, no thyromegaly Breast- normal symmetry, no nipple inversion,no nipple drainage, no nodules or lumps felt Nodes- no axillary nodes CVS- RRR, no murmur RESP-CTAB ABD-NABS,soft,NT,ND GU- normal external genitalia, vaginal mucosa pink and moist, cervix visualized no growth, no blood form os, minimal thin clear discharge, no CMT, no ovarian masses, uterus normal size EXT- No edema Pulses- Radial, DP- 2+        Assessment & Plan:      Problem List Items Addressed This Visit    None      Note: This dictation was prepared with Dragon dictation along with smaller phrase technology. Any transcriptional errors that result from this process are unintentional.

## 2015-09-25 NOTE — Progress Notes (Signed)
Patient ID: Cindy [name], female   DOB: 1956/11/24, 59 y.o.   MRN: [id number] Subjective:   Patient presents for Medicare Annual/Subsequent preventive examination.   patient here for anal wellness exam. She was recently seen by her pulmonologist she continues to have difficulty with her breathing and dyspnea on exertion. She is wearing oxygen but continues to smoke. She is on prednisone daily emesis cause more bruising and occasional swelling in her ankles.   Medications reviewed she is due for fasting labs  She would like to postpone her mammogram until next year   she has history of compression fracture and some chronic back pain. She rarely uses her oxycodone but this is actually expired and requested refill on this she also uses this for recurrent kidney stones Review Past Medical/Family/Social: Per EMR   Risk Factors  Current exercise habits: walks Dietary issues discussed: Yes  Cardiac risk factors: Marland Kitchen Hyperlipidemia  Depression Screen  (Note: if answer to either of the following is "Yes", a more complete depression screening is indicated)  Over the past two weeks, have you felt down, depressed or hopeless? No Over the past two weeks, have you felt little interest or pleasure in doing things? No Have you lost interest or pleasure in daily life? No Do you often feel hopeless? No Do you cry easily over simple problems? No   Activities of Daily Living  In your present state of health, do you have any difficulty performing the following activities?:  Driving? No  Managing money? No  Feeding yourself? No  Getting from bed to chair? No  Climbing a flight of stairs? No  Preparing food and eating?: No  Bathing or showering? No  Getting dressed: No  Getting to the toilet? No  Using the toilet:No  Moving around from place to place: No  In the past year have you fallen or had a near fall?:No  Are you sexually active? No  Do you have more than one partner? No   Hearing  Difficulties: No  Do you often ask people to speak up or repeat themselves? No  Do you experience ringing or noises in your ears? No Do you have difficulty understanding soft or whispered voices? No  Do you feel that you have a problem with memory? No Do you often misplace items? No  Do you feel safe at home? Yes  Cognitive Testing  Alert? Yes Normal Appearance?Yes  Oriented to person? Yes Place? Yes  Time? Yes  Recall of three objects? Yes  Can perform simple calculations? Yes  Displays appropriate judgment?Yes  Can read the correct time from a watch face?Yes   List the Names of Other Physician/Practitioners you currently use: Pulmonary     Screening Tests / Date Colonoscopy   - UTD                 Zostavax - age 67 Mammogram -due in 2017 Influenza Vaccine - given Tetanus/tdap PAP Smear- poor sample last year, repeated today  ROS:  GEN- denies fatigue, fever, weight loss,weakness, recent illness HEENT- denies eye drainage, change in vision, nasal discharge, CVS- denies chest pain, palpitations RESP- denies SOB, cough, wheeze ABD- denies N/V, change in stools, abd pain GU- denies dysuria, hematuria, dribbling, incontinence MSK- + joint pain, muscle aches, injury Neuro- denies headache, dizziness, syncope, seizure activity   PHYSICAL: GEN- NAD, alert and oriented x3 HEENT- PERRL, EOMI, non injected sclera, pink conjunctiva, MMM, oropharynx clear Neck- Supple, no thyromegaly Breast- normal symmetry, no nipple inversion,no  nipple drainage, no nodules or lumps felt Nodes- no axillary nodes CVS- RRR, no murmur RESP-CTAB ABD-NABS,soft,NT,ND GU- normal external genitalia, vaginal  ATROPHY, cervix visualized no growth, no blood form os, minimal thin clear discharge, no CMT, no ovarian masses, uterus normal size, urethra normal position, rectal tone normal, FOBT neg Skin- bruising bilat upper ext -forearms EXT- No edema Pulses- Radial, DP- 2+   Assessment:    Annual  wellness medicare exam   Plan:    During the course of the visit the patient was educated and counseled about appropriate screening and preventive services including:  Screening mammography  Diet review for nutrition referral? Yes ____ Not Indicated __x__  Patient Instructions (the written plan) was given to the patient.  Medicare Attestation  I have personally reviewed:  The patient's medical and social history  Their use of alcohol, tobacco or illicit drugs  Their current medications and supplements  The patient's functional ability including ADLs,fall risks, home safety risks, cognitive, and hearing and visual impairment  Diet and physical activities  Evidence for depression or mood disorders  The patient's weight, height, BMI, and visual acuity have been recorded in the chart. I have made referrals, counseling, and provided education to the patient based on review of the above and I have provided the patient with a written personalized care plan for preventive services.

## 2015-09-25 NOTE — Assessment & Plan Note (Signed)
Unchanged, flu shot given

## 2015-09-25 NOTE — Assessment & Plan Note (Signed)
Recheck cholesterol levels. I'll also recheck an A1c as she is on chronic prednisone this had persistent hyperglycemia

## 2015-09-26 LAB — PAP THINPREP ASCUS RFLX HPV RFLX TYPE

## 2015-09-26 LAB — HEMOGLOBIN A1C
HEMOGLOBIN A1C: 6.1 % — AB (ref <5.7)
Mean Plasma Glucose: 128 mg/dL — ABNORMAL HIGH (ref <117)

## 2015-09-26 LAB — HEPATITIS C ANTIBODY: HCV Ab: NEGATIVE

## 2015-10-03 ENCOUNTER — Telehealth: Payer: Self-pay | Admitting: Emergency Medicine

## 2015-10-03 MED ORDER — PREDNISONE 10 MG PO TABS: 10.0000 mg | ORAL_TABLET | Freq: Every day | ORAL | Status: DC

## 2015-10-03 NOTE — Telephone Encounter (Signed)
Spoke with pt. Needs refill on Prednisone. Rx has been sent in. Nothing further was needed.

## 2015-10-19 ENCOUNTER — Telehealth: Payer: Self-pay | Admitting: Family Medicine

## 2015-10-19 MED ORDER — NYSTATIN 100000 UNIT/ML MT SUSP: 5.0000 mL | Freq: Four times a day (QID) | OROMUCOSAL | Status: DC

## 2015-10-19 NOTE — Telephone Encounter (Signed)
Wants refill on nystatin liquid 1000u/ml   Pharmacy Echo [phone] pharmacist Vaughan Basta  Pt also c/o being dizzy and has questions about it.  Please call pt back [phone]

## 2015-10-19 NOTE — Telephone Encounter (Signed)
Okay to refill Nystatin swish and swallow

## 2015-10-19 NOTE — Telephone Encounter (Signed)
Call placed to patient.  Reports that she has frequent bouts of thrush D/T inhalers.   Prescription sent to pharmacy.   Also wanted to notify MD that she has had (1) episode of vertigo. She thinks her BS may have dropped since she was very busy and had not eaten much that day.   MD to be made aware.

## 2015-10-22 ENCOUNTER — Ambulatory Visit (INDEPENDENT_AMBULATORY_CARE_PROVIDER_SITE_OTHER): Payer: Medicare Other | Admitting: Family Medicine

## 2015-10-22 ENCOUNTER — Encounter: Payer: Self-pay | Admitting: Family Medicine

## 2015-10-22 VITALS — BP 130/72 | HR 100 | Temp 98.3°F | Resp 20 | Ht 63.0 in | Wt 156.0 lb

## 2015-10-22 DIAGNOSIS — J441 Chronic obstructive pulmonary disease with (acute) exacerbation: Secondary | ICD-10-CM | POA: Diagnosis not present

## 2015-10-22 DIAGNOSIS — Z720 Tobacco use: Secondary | ICD-10-CM

## 2015-10-22 MED ORDER — PREDNISONE 20 MG PO TABS
ORAL_TABLET | ORAL | Status: DC
Start: 2015-10-22 — End: 2015-12-20

## 2015-10-22 MED ORDER — METHYLPREDNISOLONE ACETATE 80 MG/ML IJ SUSP
40.0000 mg | Freq: Once | INTRAMUSCULAR | Status: AC
  Administered 2015-10-22: 40 mg via INTRAMUSCULAR

## 2015-10-22 MED ORDER — AMOXICILLIN 875 MG PO TABS: 875.0000 mg | ORAL_TABLET | Freq: Two times a day (BID) | ORAL | Status: DC

## 2015-10-22 MED ORDER — PROMETHAZINE-CODEINE 6.25-10 MG/5ML PO SYRP: 5.0000 mL | ORAL_SOLUTION | Freq: Four times a day (QID) | ORAL | Status: DC | PRN

## 2015-10-22 MED ORDER — IPRATROPIUM-ALBUTEROL 0.5-2.5 (3) MG/3ML IN SOLN
3.0000 mL | Freq: Once | RESPIRATORY_TRACT | Status: AC
  Administered 2015-10-22: 3 mL via RESPIRATORY_TRACT

## 2015-10-22 NOTE — Patient Instructions (Signed)
Starting tomorrow- start prednisone, after the 20mg  restart your 10mg  once a day Use nebulizer Antibiotics given F/U as previous

## 2015-10-22 NOTE — Progress Notes (Signed)
Patient ID: Cindy [name], female   DOB: 20-Sep-1956, 59 y.o.   MRN: [id number]   Subjective:    Patient ID: Cindy [name], female    DOB: 10-18-1956, 59 y.o.   MRN: [id number]  Patient presents for Illness  She here with typical COPD exacerbation. On Saturday night she began coughing and wheezing more than typical her cough is productive. She's not had any fever no chills. She is still wearing 2 L of oxygen. She is on 10 mg of prednisone daily in conjunction with her other COPD inhalers. She continues to smoke. She denies any chest pain does have chest tightness from the cough and wheeze. Last breathing treatment was 4:00 this morning   Review Of Systems:  GEN- denies fatigue, fever, weight loss,weakness, recent illness HEENT- denies eye drainage, change in vision, nasal discharge, CVS- denies chest pain, palpitations RESP- + SOB, +cough, +wheeze ABD- denies N/V, change in stools, abd pain Neuro- denies headache, dizziness, syncope, seizure activity       Objective:    BP 130/72 mmHg  Pulse 100  Temp(Src) 98.3 F (36.8 C) (Oral)  Resp 20  Ht 5\' 3"  (1.6 m)  Wt 156 lb (70.761 kg)  BMI 27.64 kg/m2  SpO2 91% GEN- NAD, alert and oriented x3 HEENT- PERRL, EOMI, non injected sclera, pink conjunctiva, MMM, oropharynx clear Neck- Supple, no LAD CVS- RRR, no murmur RESP- blat wheeze, rhonchi, increased WOB, decreased air movement Pulses- Radial,  2+        Assessment & Plan:      Problem List Items Addressed This Visit    None    Visit Diagnoses    COPD exacerbation ([place])    -  Primary     S/P Duoneb in office, improved WOB, given Depo Medrol, will give Prednisone taper, then resume her 10mg , continue mucinex, COugh medicine given,amox    Relevant Medications    promethazine-codeine (PHENERGAN WITH CODEINE) 6.25-10 MG/5ML syrup    predniSONE (DELTASONE) 20 MG tablet    methylPREDNISolone acetate (DEPO-MEDROL) injection 40 mg (Completed)    ipratropium-albuterol (DUONEB)  0.5-2.5 (3) MG/3ML nebulizer solution 3 mL (Completed)       Note: This dictation was prepared with Dragon dictation along with smaller phrase technology. Any transcriptional errors that result from this process are unintentional.

## 2015-11-06 ENCOUNTER — Telehealth: Payer: Self-pay | Admitting: *Deleted

## 2015-11-06 ENCOUNTER — Other Ambulatory Visit: Payer: Self-pay | Admitting: *Deleted

## 2015-11-06 MED ORDER — ALBUTEROL SULFATE (2.5 MG/3ML) 0.083% IN NEBU: 2.5000 mg | INHALATION_SOLUTION | Freq: Four times a day (QID) | RESPIRATORY_TRACT | Status: DC | PRN

## 2015-11-06 NOTE — Telephone Encounter (Signed)
Received fax requesting refill on Albuterol Neb Tx.   Refill appropriate and filled per protocol.

## 2015-11-06 NOTE — Telephone Encounter (Signed)
Pt called stating needs refill on nebulizer solutions, sent to [facility] phamracy

## 2015-11-30 ENCOUNTER — Ambulatory Visit (HOSPITAL_COMMUNITY)
Admission: RE | Admit: 2015-11-30 | Discharge: 2015-11-30 | Disposition: A | Discharge status: Home / Self Care | Payer: Medicare Other | Source: Ambulatory Visit | Attending: Urology | Admitting: Urology

## 2015-11-30 ENCOUNTER — Other Ambulatory Visit: Payer: Self-pay | Admitting: Urology

## 2015-11-30 DIAGNOSIS — N200 Calculus of kidney: Secondary | ICD-10-CM | POA: Diagnosis not present

## 2015-12-04 ENCOUNTER — Ambulatory Visit (INDEPENDENT_AMBULATORY_CARE_PROVIDER_SITE_OTHER): Payer: Medicare Other | Admitting: Urology

## 2015-12-04 DIAGNOSIS — N200 Calculus of kidney: Secondary | ICD-10-CM | POA: Diagnosis not present

## 2015-12-04 DIAGNOSIS — D3500 Benign neoplasm of unspecified adrenal gland: Secondary | ICD-10-CM | POA: Diagnosis not present

## 2015-12-20 ENCOUNTER — Ambulatory Visit (INDEPENDENT_AMBULATORY_CARE_PROVIDER_SITE_OTHER): Payer: Medicare Other | Admitting: Physician Assistant

## 2015-12-20 ENCOUNTER — Encounter: Payer: Self-pay | Admitting: Physician Assistant

## 2015-12-20 VITALS — BP 114/78 | HR 88 | Temp 98.3°F | Resp 20 | Wt 161.0 lb

## 2015-12-20 DIAGNOSIS — J441 Chronic obstructive pulmonary disease with (acute) exacerbation: Secondary | ICD-10-CM

## 2015-12-20 MED ORDER — AMOXICILLIN-POT CLAVULANATE 875-125 MG PO TABS: 1.0000 | ORAL_TABLET | Freq: Two times a day (BID) | ORAL | Status: DC

## 2015-12-20 MED ORDER — PREDNISONE 20 MG PO TABS: ORAL_TABLET | ORAL | Status: DC

## 2015-12-20 MED ORDER — IPRATROPIUM-ALBUTEROL 0.5-2.5 (3) MG/3ML IN SOLN
3.0000 mL | Freq: Once | RESPIRATORY_TRACT | Status: AC
  Administered 2015-12-20: 3 mL via RESPIRATORY_TRACT

## 2015-12-20 MED ORDER — METHYLPREDNISOLONE ACETATE 80 MG/ML IJ SUSP
60.0000 mg | Freq: Once | INTRAMUSCULAR | Status: AC
  Administered 2015-12-20: 60 mg via INTRAMUSCULAR

## 2015-12-20 NOTE — Progress Notes (Signed)
Patient ID: MOUNA LAHR MRN: [id number], DOB: Aug 20, 1956, 59 y.o. Date of Encounter: 12/20/2015, 11:24 AM    Chief Complaint:  Chief Complaint  Patient presents with  . c/o trouble breathing     HPI: 59 y.o. year old white female says that she started having increased wheezing and difficulty breathing last week. Also increased cough. Is currently on her nasal cannula oxygen. Has been using her pulmonary medicines as directed. Has had no fevers or chills. No nasal congestion no sore throat.     Home Meds:   Outpatient Prescriptions Prior to Visit  Medication Sig Dispense Refill  . albuterol (PROAIR HFA) 108 (90 BASE) MCG/ACT inhaler Inhale 2 puffs into the lungs every 6 (six) hours as needed for wheezing or shortness of breath. 18 g 11  . albuterol (PROVENTIL) (2.5 MG/3ML) 0.083% nebulizer solution Take 3 mLs (2.5 mg total) by nebulization every 6 (six) hours as needed for wheezing or shortness of breath. 150 mL 1  . alendronate (FOSAMAX) 70 MG tablet Take 1 tablet (70 mg total) by mouth every 7 (seven) days. Take with a full glass of water on an empty stomach.(wednesday) 4 tablet 6  . aspirin EC 81 MG tablet Take 81 mg by mouth every morning.     . budesonide-formoterol (SYMBICORT) 160-4.5 MCG/ACT inhaler Inhale 2 puffs into the lungs 2 (two) times daily. 1 Inhaler 11  . Calcium Carbonate-Vitamin D (CALCIUM 600/VITAMIN D) 600-400 MG-UNIT per tablet Take 1 tablet by mouth 2 (two) times daily.    . Multiple Vitamin (MULITIVITAMIN WITH MINERALS) TABS Take 1 tablet by mouth every morning.     . nystatin (MYCOSTATIN) 100000 UNIT/ML suspension Use as directed 5 mLs (500,000 Units total) in the mouth or throat 4 (four) times daily. 240 mL 3  . oxyCODONE-acetaminophen (ROXICET) 5-325 MG per tablet Take 1 tablet by mouth every 8 (eight) hours as needed for severe pain. 30 tablet 0  . pantoprazole (PROTONIX) 40 MG tablet take 1 tablet by mouth once daily 90 tablet 1  . predniSONE  (DELTASONE) 10 MG tablet Take 1 tablet (10 mg total) by mouth daily. 30 tablet 5  . simvastatin (ZOCOR) 20 MG tablet Take 1 tablet (20 mg total) by mouth at bedtime. 90 tablet 1  . Tiotropium Bromide Monohydrate (SPIRIVA RESPIMAT) 2.5 MCG/ACT AERS Inhale 2 sprays into the lungs daily. 1 Inhaler 11  . amoxicillin (AMOXIL) 875 MG tablet Take 1 tablet (875 mg total) by mouth 2 (two) times daily. 14 tablet 0  . predniSONE (DELTASONE) 20 MG tablet Take 40mg  x 3 days, then 20mg  x 3 days, 9 tablet 0  . promethazine-codeine (PHENERGAN WITH CODEINE) 6.25-10 MG/5ML syrup Take 5 mLs by mouth every 6 (six) hours as needed for cough. (Patient not taking: Reported on 12/20/2015) 120 mL 0   No facility-administered medications prior to visit.    Allergies:  Allergies  Allergen Reactions  . Keflex [Cephalexin] Anaphylaxis and Rash  . Doxycycline Other (See Comments)    Gave patient oral thrush  . Sulfa Antibiotics   . Avelox [Moxifloxacin Hcl In Nacl] Itching and Rash  . Toradol [Ketorolac Tromethamine] Swelling, Rash and Other (See Comments)    Bruising, swelling, rash at injection site      Review of Systems: See HPI for pertinent ROS. All other ROS negative.    Physical Exam: Blood pressure 114/78, pulse 88, temperature 98.3 F (36.8 C), temperature source Oral, resp. rate 20, weight 161 lb (73.029 kg), SpO2 97 %.,  Body mass index is 28.53 kg/(m^2). General:  WNWD WF. Appears in no acute distress. HEENT: Normocephalic, atraumatic, eyes without discharge, sclera non-icteric, nares are without discharge. Bilateral auditory canals clear, TM's are without perforation, pearly grey and translucent with reflective cone of light bilaterally. Oral cavity moist, posterior pharynx without exudate, erythema, peritonsillar abscess.  Neck: Supple. No thyromegaly. No lymphadenopathy. Lungs: She has harsh wheezes present throughout bilaterally.  Heart: Regular rhythm. No murmurs, rubs, or gallops. Msk:   Strength and tone normal for age. Extremities/Skin: Warm and dry.  Neuro: Alert and oriented X 3. Moves all extremities spontaneously. Gait is normal. CNII-XII grossly in tact. Psych:  Responds to questions appropriately with a normal affect.     ASSESSMENT AND PLAN:  59 y.o. year old female with  1. COPD with acute exacerbation ([place]) Was going to prescribe azithromycin but she repeatedly request Augmentin saying that this has worked for her in the past and that she is very sensitive to different medications. She is to go ahead and start the Augmentin today. She is being given a DuoNeb here in the office and also Depo-Medrol 60 mg IM. She is to start the Augmentin today but wait and start the oral prednisone tomorrow. She is to follow-up if breathing worsens or does not return to baseline upon completion of these medications. - predniSONE (DELTASONE) 20 MG tablet; Take 40mg  x 3 days, then 20mg  x 3 days,  Dispense: 9 tablet; Refill: 0 - amoxicillin-clavulanate (AUGMENTIN) 875-125 MG tablet; Take 1 tablet by mouth 2 (two) times daily.  Dispense: 14 tablet; Refill: 0   Signed, [address] [place], [place], [facility] 12/20/2015 11:24 AM

## 2015-12-20 NOTE — Addendum Note (Signed)
Addended by: Olena Mater on: 12/20/2015 11:38 AM   Modules accepted: Orders

## 2016-01-25 ENCOUNTER — Ambulatory Visit (INDEPENDENT_AMBULATORY_CARE_PROVIDER_SITE_OTHER): Payer: Medicare Other | Admitting: Emergency Medicine

## 2016-01-25 ENCOUNTER — Encounter: Payer: Self-pay | Admitting: Emergency Medicine

## 2016-01-25 VITALS — BP 130/70 | HR 100 | Wt 162.0 lb

## 2016-01-25 DIAGNOSIS — J411 Mucopurulent chronic bronchitis: Secondary | ICD-10-CM | POA: Diagnosis not present

## 2016-01-25 NOTE — Progress Notes (Signed)
Subjective:    Patient ID: Cindy [name], female    DOB: 10-28-1956, 60 y.o.   MRN: [id number]  HPI 60 yo woman, smoker (40pk-yrs), hyperlipidemia. Followed by Dr Buelah Manis and on Spiriva + Symbicort + ProAir prn, FEV1 19% predicted on prior PFT! She has had several exacerbations over the last year. Was first noted to be hypoxemic in 2010, is on O2 that she is using prn.  She has exertional wheeze, SOB. Coughs daily - productive white, thick.   ROV 09/27/12 -- follows up COPD, hypoxemia, on Spiriva/Symbicort. 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 Since last visit has been by Dr Buelah Manis and in the ED, treated with pred for chest tightness.  Uses albuterol every 6 hours. Hasn;t had a-1 AT yet to my knowledge.   ROV 12/01/12 -- COPD + hypoxemia, severe AFL. Returns for f/u. Continues to smoke 0.5-1.0 pk/day. About last 3 weeks, URI sx, nasal congestion, sore throat. Was treated by Dr Buelah Manis with augmentin and prednisone. Remains on Spiriva + Symbicort.   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 Has been seen in the ED and by Dr Buelah Manis several times since our last visit. She is finishing augmentin + pred now to treat recent AE + bronchitic sx. She continues to smoke > 1pk/day. Using SABA a few times a week. Improved cough, green sputum.   ROV 02/22/13 -- severe COPD, frequent AE's, hypoxemia. Active tobacco use. We started daliresp last time. She reports that she is doing OK, she was treated with abx/pred before our last visit 1/17. She reports constipation since last visit, ? From daliresp. She is smoking >1pk/day. She is asking for prednisone for her back pain, constipation.   ROV 04/22/13 -- Active smoker, Severe COPD, frequent AE's most recently in March (since our last appt), ? Due to allergies. Finished pred, started fluticasone. She is dealing with pain from compression fracture. Remains on symbicort and spiriva. She has also recently been  found to have L adrenal adenoma > to be seen by urology, [place].   ROV 06/14/13 -- Active smoker, Severe COPD, frequent AE's, allergic rhinitis. She was admitted for N/V, ? Renal stone/UTI, a COPD exac 6/1-6/2. She is feeling better, no evidence exacerbation right now. She is on Symbicort, Spiriva.   ROV 08/25/13 -- Active smoker, Severe COPD, frequent AE's, allergic rhinitis. She c/o worsening over the last week >  Having radicular LE pain, she has had more chest tightness this week and SOB. She saw Dr Buelah Manis last week and has been treated with augmentin+ pred taper. She has improved after rx for AE.   ROV 10/27/13 -- Active smoker, Severe COPD, frequent AE's, allergic rhinitis. Here for f/u. She tells me that she feels very well, just treated with prednisone + azithro for nasal congestion > finally improved. On Spiriva + Symbicort. Wears her O2 with some exertion but not all. Smoking 1 pk a day.   ROV 12/16/13 -- Active smoker > 1 pack/day, Severe COPD, frequent AE's, allergic rhinitis. She presents today for f/u. She developed some chest tightness, increased nasal congestion, cough with clear phlegm, possible chills, no documented fevers. She saw Dr Buelah Manis and was treated for an AE with solumedrol, prednisone and augmentin.   ROV 03/14/14 -- severe COPD, continues to smoke, allergies, freq AE's. She is currently being treated with pred + azithro, finishing today. This was her first flare since 12/16/13.  Still smokes a pack a  day. Discussed in detail today. She is willing to try to cut down. We talked about strategies today.   ROV 07/05/14 -- severe COPD, continues to smoke, allergies, freq AE's. She has been treated by Dr Buelah Manis x 2 for AE's with pred and abx. She had PFT today that show FEV1 0.68 - 0.81L (29-34% predicted). Her spiriva was changed to the respimat version. She is smoking 1 pk a day.   ROV 10/31/14 -- follow up visit for tobacco use, severe COPD rhinitis. She flares frequently.  Presents today with hoarseness, continued cough, just received azithro + pred taper.  She doesn't feel that she is over it yet - still with chest tightness.   ROV 11/30/14 -- follows up for COPD and continued tobacco use. She has a chronic bronchitis component and is seen frequently. Last month we treated her for an AE. She is improved today - she has had some headache and a tickle or pain in her upper chest. She has noticed some exertional desaturations. She has needed cough medication.   ROV 01/09/15 -- follow up visit for her COPD and tobacco use. She tells me that she is hanging on, but that she has SOB episodes, has to use her SABA a few times during the day. She doesn't feel that the symbicort lasts all 12 hours. No abx since last time. We started pred 10mg  qd last time. She is worried about the side effects. She may have benefited some, she at least hasn't had a flare.  ROV 05/08/15 -- Follow-up visit for tobacco use and COPD.  She is still smoking, 1 pk/day. She is on spiriva and symbicort. She uses albuterol HFA and nebs prn, about 1-2x a week. Prednisone 10mg  daily since Nov '15. It has seemed to help her.   ROV 09/04/15 -- follow-up visit for tobacco use and severe COPD. I have had her on chronic prednisone, currently 10 mg daily. She is also using Spiriva and Symbicort.  She is having significant exertional SOB with any activity. She has been using her O2 with exertion. She is having daily cough, brings up thick mucous, no purulence. No change in her cigarettes - 1pk/day. Has not required any extra pred or abx since our last visit.   ROV 01/25/16 -- follow-up visit of tobacco use and severe COPD. She has been managed on chronic prednisone as well as Spiriva and Symbicort. She reports that she has been doing well until she had an AE in 10/16 and then again in 12/16. She required steroids, augmentin. She is currently using albuterol about once a day. She is now back to her usual 10mg  pred daily.     PULMONARY FUNCTON TEST 09/27/2012  FVC 2.31  FEV1 .87  FEV1/FVC 37.7  FVC  % Predicted 76  FEV % Predicted 38  FeF 25-75 .3  FeF 25-75 % Predicted 2.65       Objective:   Physical Exam Filed Vitals:   01/25/16 1529  BP: 130/70  Pulse: 100  Weight: 162 lb (73.483 kg)  SpO2: 95%   Gen: Pleasant, well-nourished, in no distress,   ENT: No lesions,  mouth clear,  oropharynx clear, no postnasal drip  Neck: No JVD, no TMG, no carotid bruits, no stridor  Lungs: No use of accessory muscles, B rhonchi, B exp wheezes.   Cardiovascular: RRR, heart sounds normal, no murmur or gallops, no peripheral edema  Musculoskeletal: No deformities, no cyanosis or clubbing  Neuro: alert, non focal  Skin: Warm, no  lesions or rashes     Assessment & Plan:  COPD (chronic obstructive pulmonary disease) Please continue your spiriva and symbicort Increase your prednisone to 20mg  for the next 7 days, then go back to 10mg  daily.  Continue your oxygen at 2L/min with exertion and at night.  Use ProAir 2 puffs as needed for shortness of breath You need to continue to work on stopping smoking. This is crucial for your health. Try to cut down to 3/4 pack a day by our next appointment  Consider restarting your mucinex 600mg  once a day.  Follow with Dr Lamonte Sakai in 3 months or sooner if you have any problems.

## 2016-01-25 NOTE — Assessment & Plan Note (Signed)
Please continue your spiriva and symbicort Increase your prednisone to 20mg  for the next 7 days, then go back to 10mg  daily.  Continue your oxygen at 2L/min with exertion and at night.  Use ProAir 2 puffs as needed for shortness of breath You need to continue to work on stopping smoking. This is crucial for your health. Try to cut down to 3/4 pack a day by our next appointment  Consider restarting your mucinex 600mg  once a day.  Follow with Dr Lamonte Sakai in 3 months or sooner if you have any problems.

## 2016-01-25 NOTE — Patient Instructions (Signed)
Please continue your spiriva and symbicort Increase your prednisone to 20mg  for the next 7 days, then go back to 10mg  daily.  Continue your oxygen at 2L/min with exertion and at night.  Use ProAir 2 puffs as needed for shortness of breath You need to continue to work on stopping smoking. This is crucial for your health. Try to cut down to 3/4 pack a day by our next appointment  Consider restarting your mucinex 600mg  once a day.  Follow with Dr Lamonte Sakai in 3 months or sooner if you have any problems.

## 2016-01-28 ENCOUNTER — Telehealth: Payer: Self-pay | Admitting: Emergency Medicine

## 2016-01-28 MED ORDER — PREDNISONE 10 MG PO TABS: 20.0000 mg | ORAL_TABLET | Freq: Every day | ORAL | Status: DC

## 2016-01-28 NOTE — Telephone Encounter (Signed)
Spoke with pt. She is referring to the prednisone rx that was to be sent to her pharmacy. This rx has been sent in. Nothing further was needed.

## 2016-02-19 ENCOUNTER — Ambulatory Visit (INDEPENDENT_AMBULATORY_CARE_PROVIDER_SITE_OTHER): Payer: Medicare Other | Admitting: Family Medicine

## 2016-02-19 ENCOUNTER — Encounter: Payer: Self-pay | Admitting: Family Medicine

## 2016-02-19 VITALS — BP 138/74 | HR 66 | Temp 98.2°F | Resp 18 | Ht 63.0 in | Wt 169.0 lb

## 2016-02-19 DIAGNOSIS — R609 Edema, unspecified: Secondary | ICD-10-CM | POA: Diagnosis not present

## 2016-02-19 DIAGNOSIS — J441 Chronic obstructive pulmonary disease with (acute) exacerbation: Secondary | ICD-10-CM

## 2016-02-19 DIAGNOSIS — J0100 Acute maxillary sinusitis, unspecified: Secondary | ICD-10-CM | POA: Diagnosis not present

## 2016-02-19 MED ORDER — ALBUTEROL SULFATE HFA 108 (90 BASE) MCG/ACT IN AERS: 2.0000 | INHALATION_SPRAY | Freq: Four times a day (QID) | RESPIRATORY_TRACT | Status: DC | PRN

## 2016-02-19 MED ORDER — HYDROCHLOROTHIAZIDE 12.5 MG PO TABS: 12.5000 mg | ORAL_TABLET | ORAL | Status: DC | PRN

## 2016-02-19 MED ORDER — METHYLPREDNISOLONE ACETATE 40 MG/ML IJ SUSP
40.0000 mg | Freq: Once | INTRAMUSCULAR | Status: AC
  Administered 2016-02-19: 40 mg via INTRAMUSCULAR

## 2016-02-19 MED ORDER — AMOXICILLIN 875 MG PO TABS: 875.0000 mg | ORAL_TABLET | Freq: Two times a day (BID) | ORAL | Status: DC

## 2016-02-19 MED ORDER — PREDNISONE 20 MG PO TABS: ORAL_TABLET | ORAL | Status: DC

## 2016-02-19 MED ORDER — PROMETHAZINE-CODEINE 6.25-10 MG/5ML PO SYRP: 5.0000 mL | ORAL_SOLUTION | Freq: Four times a day (QID) | ORAL | Status: DC | PRN

## 2016-02-19 NOTE — Patient Instructions (Signed)
Take antibiotics as prescribed Prednisone 40mg  for breathing Cough medicine  Use HCTZ 12.5mg  once a day as needed for swelling  We will change to Ventolin  F/U 2 months

## 2016-02-19 NOTE — Progress Notes (Signed)
Patient ID: Cindy [name], female   DOB: 06-18-1956, 60 y.o.   MRN: [id number]   Subjective:    Patient ID: Cindy [name], female    DOB: 04/25/1956, 60 y.o.   MRN: [id number]  Patient presents for COPD Exacerbation and Edema  patient here with typical  COPD exacerbation. Started sinusitis symptoms last week she's had a lot of drainage she's been using nasal saline and Flonase down and is in her chest and she is very tight. She is followed by pulmonary she is on chronic prednisone daily she has exacerbated all other regimens. She is on 2 L of oxygen. She has not had any fever or body aches. Her cough is more productive. She uses Mucinex. She also complains that she is getting some swelling in her ankles on and off for the past few months. She denies any chest pain or change in her breathing associated with the fluid retention.    Review Of Systems:  GEN- denies fatigue, fever, weight loss,weakness, recent illness HEENT- denies eye drainage, change in vision, +nasal discharge, CVS- denies chest pain, palpitations RESP- +SOB, +cough, +wheeze ABD- denies N/V, change in stools, abd pain Neuro- denies headache, dizziness, syncope, seizure activity       Objective:    BP 138/74 mmHg  Pulse 66  Temp(Src) 98.2 F (36.8 C) (Oral)  Resp 18  Ht 5\' 3"  (1.6 m)  Wt 169 lb (76.658 kg)  BMI 29.94 kg/m2  SpO2 96% GEN- NAD, alert and oriented x3 HEENT- PERRL, EOMI, non injected sclera, pink conjunctiva, MMM, oropharynx clear, nares +rhinorrhea, mild maxillary sinus tenderness, TM clear no effusion  Neck- Supple, no LAD CVS- RRR, no murmur RESP- bialateal wheeze, rhonchi, increased WOB,  Fair air movement Ext- mild pedal edema bilat  Pulses- Radial,  2+       Assessment & Plan:      Problem List Items Addressed This Visit    None    Visit Diagnoses    COPD exacerbation (HCC)    -  Primary    Typical exacerbation, Depo Medrol in office, increase prednisone to 40mg  x 5 days, then resume  10mg , continue inhalers, nebs    Relevant Medications    promethazine-codeine (PHENERGAN WITH CODEINE) 6.25-10 MG/5ML syrup    predniSONE (DELTASONE) 20 MG tablet    albuterol (PROVENTIL HFA;VENTOLIN HFA) 108 (90 Base) MCG/ACT inhaler    Acute maxillary sinusitis, recurrence not specified        Started with sinusitis, amoxicillin given, continue nasal saline    Relevant Medications    promethazine-codeine (PHENERGAN WITH CODEINE) 6.25-10 MG/5ML syrup    amoxicillin (AMOXIL) 875 MG tablet    predniSONE (DELTASONE) 20 MG tablet    Peripheral edema         mild  edema which I think is secondary to the chronic prednisone. Given HCTZ prn       Note: This dictation was prepared with Dragon dictation along with smaller phrase technology. Any transcriptional errors that result from this process are unintentional.

## 2016-02-19 NOTE — Addendum Note (Signed)
Addended by: Sheral Flow on: 02/19/2016 03:35 PM   Modules accepted: Orders

## 2016-02-24 ENCOUNTER — Inpatient Hospital Stay (HOSPITAL_COMMUNITY)
Admission: EM | Admit: 2016-02-24 | Discharge: 2016-02-27 | DRG: 190 | Disposition: A | Discharge status: Home / Self Care | Payer: Medicare Other | Attending: Family Medicine | Admitting: Family Medicine

## 2016-02-24 ENCOUNTER — Emergency Department (HOSPITAL_COMMUNITY): Payer: Medicare Other

## 2016-02-24 ENCOUNTER — Encounter (HOSPITAL_COMMUNITY): Payer: Self-pay | Admitting: *Deleted

## 2016-02-24 DIAGNOSIS — K210 Gastro-esophageal reflux disease with esophagitis: Secondary | ICD-10-CM | POA: Diagnosis not present

## 2016-02-24 DIAGNOSIS — E079 Disorder of thyroid, unspecified: Secondary | ICD-10-CM | POA: Diagnosis present

## 2016-02-24 DIAGNOSIS — J441 Chronic obstructive pulmonary disease with (acute) exacerbation: Secondary | ICD-10-CM | POA: Diagnosis not present

## 2016-02-24 DIAGNOSIS — F17210 Nicotine dependence, cigarettes, uncomplicated: Secondary | ICD-10-CM | POA: Diagnosis not present

## 2016-02-24 DIAGNOSIS — E785 Hyperlipidemia, unspecified: Secondary | ICD-10-CM | POA: Diagnosis present

## 2016-02-24 DIAGNOSIS — J9611 Chronic respiratory failure with hypoxia: Secondary | ICD-10-CM | POA: Diagnosis present

## 2016-02-24 DIAGNOSIS — K219 Gastro-esophageal reflux disease without esophagitis: Secondary | ICD-10-CM | POA: Diagnosis present

## 2016-02-24 DIAGNOSIS — R0602 Shortness of breath: Secondary | ICD-10-CM | POA: Diagnosis not present

## 2016-02-24 DIAGNOSIS — E782 Mixed hyperlipidemia: Secondary | ICD-10-CM | POA: Diagnosis present

## 2016-02-24 DIAGNOSIS — R0902 Hypoxemia: Secondary | ICD-10-CM

## 2016-02-24 DIAGNOSIS — J9621 Acute and chronic respiratory failure with hypoxia: Secondary | ICD-10-CM | POA: Diagnosis not present

## 2016-02-24 DIAGNOSIS — Z8249 Family history of ischemic heart disease and other diseases of the circulatory system: Secondary | ICD-10-CM

## 2016-02-24 DIAGNOSIS — Z720 Tobacco use: Secondary | ICD-10-CM | POA: Diagnosis present

## 2016-02-24 LAB — BLOOD GAS, ARTERIAL
Acid-Base Excess: 10.6 mmol/L — ABNORMAL HIGH (ref 0.0–2.0)
BICARBONATE: 32.1 meq/L — AB (ref 20.0–24.0)
Drawn by: 23534
O2 Content: 2 L/min
O2 Saturation: 94.6 %
PH ART: 7.358 (ref 7.350–7.450)
PO2 ART: 81.3 mmHg (ref 80.0–100.0)
pCO2 arterial: 66.3 mmHg (ref 35.0–45.0)

## 2016-02-24 LAB — CBC WITH DIFFERENTIAL/PLATELET
Basophils Absolute: 0 10*3/uL (ref 0.0–0.1)
Basophils Relative: 0 %
EOS PCT: 0 %
Eosinophils Absolute: 0 10*3/uL (ref 0.0–0.7)
HCT: 48.4 % — ABNORMAL HIGH (ref 36.0–46.0)
Hemoglobin: 15.3 g/dL — ABNORMAL HIGH (ref 12.0–15.0)
LYMPHS ABS: 1.2 10*3/uL (ref 0.7–4.0)
LYMPHS PCT: 8 %
MCH: 31.7 pg (ref 26.0–34.0)
MCHC: 31.6 g/dL (ref 30.0–36.0)
MCV: 100.4 fL — AB (ref 78.0–100.0)
MONO ABS: 0.4 10*3/uL (ref 0.1–1.0)
Monocytes Relative: 3 %
Neutro Abs: 12.9 10*3/uL — ABNORMAL HIGH (ref 1.7–7.7)
Neutrophils Relative %: 89 %
PLATELETS: 249 10*3/uL (ref 150–400)
RBC: 4.82 MIL/uL (ref 3.87–5.11)
RDW: 13.9 % (ref 11.5–15.5)
WBC: 14.5 10*3/uL — ABNORMAL HIGH (ref 4.0–10.5)

## 2016-02-24 LAB — BASIC METABOLIC PANEL
Anion gap: 8 (ref 5–15)
BUN: 15 mg/dL (ref 6–20)
CO2: 38 mmol/L — ABNORMAL HIGH (ref 22–32)
CREATININE: 0.81 mg/dL (ref 0.44–1.00)
Calcium: 10 mg/dL (ref 8.9–10.3)
Chloride: 97 mmol/L — ABNORMAL LOW (ref 101–111)
GFR calc Af Amer: >60 mL/min (ref >60)
GLUCOSE: 191 mg/dL — AB (ref 65–99)
POTASSIUM: 4.7 mmol/L (ref 3.5–5.1)
Sodium: 143 mmol/L (ref 135–145)

## 2016-02-24 LAB — TROPONIN I: Troponin I: <0.03 ng/mL (ref <0.031)

## 2016-02-24 LAB — BRAIN NATRIURETIC PEPTIDE: B NATRIURETIC PEPTIDE 5: 27 pg/mL (ref 0.0–100.0)

## 2016-02-24 MED ORDER — PANTOPRAZOLE SODIUM 40 MG PO TBEC
40.0000 mg | DELAYED_RELEASE_TABLET | Freq: Two times a day (BID) | ORAL | Status: DC
  Administered 2016-02-25 – 2016-02-27 (×6): 40 mg via ORAL
  Filled 2016-02-24 (×6): qty 1

## 2016-02-24 MED ORDER — FLUTICASONE PROPIONATE 50 MCG/ACT NA SUSP
1.0000 | Freq: Every day | NASAL | Status: DC | PRN
  Filled 2016-02-24: qty 16

## 2016-02-24 MED ORDER — ENOXAPARIN SODIUM 40 MG/0.4ML ~~LOC~~ SOLN
40.0000 mg | SUBCUTANEOUS | Status: DC
  Administered 2016-02-25 – 2016-02-26 (×3): 40 mg via SUBCUTANEOUS
  Filled 2016-02-24 (×2): qty 0.4

## 2016-02-24 MED ORDER — METHYLPREDNISOLONE SODIUM SUCC 125 MG IJ SOLR
125.0000 mg | Freq: Once | INTRAMUSCULAR | Status: AC
  Administered 2016-02-24: 125 mg via INTRAVENOUS
  Filled 2016-02-24: qty 2

## 2016-02-24 MED ORDER — SODIUM CHLORIDE 0.9% FLUSH
3.0000 mL | Freq: Two times a day (BID) | INTRAVENOUS | Status: DC
  Administered 2016-02-24 – 2016-02-27 (×6): 3 mL via INTRAVENOUS

## 2016-02-24 MED ORDER — SIMVASTATIN 20 MG PO TABS
20.0000 mg | ORAL_TABLET | Freq: Every day | ORAL | Status: DC
  Administered 2016-02-25 – 2016-02-26 (×3): 20 mg via ORAL
  Filled 2016-02-24 (×3): qty 1

## 2016-02-24 MED ORDER — ACETAMINOPHEN 325 MG PO TABS
650.0000 mg | ORAL_TABLET | Freq: Four times a day (QID) | ORAL | Status: DC | PRN
  Administered 2016-02-25 – 2016-02-27 (×6): 650 mg via ORAL
  Filled 2016-02-24 (×6): qty 2

## 2016-02-24 MED ORDER — ONDANSETRON HCL 4 MG PO TABS: 4.0000 mg | ORAL_TABLET | Freq: Four times a day (QID) | ORAL | Status: DC | PRN

## 2016-02-24 MED ORDER — HYDROCHLOROTHIAZIDE 25 MG PO TABS
12.5000 mg | ORAL_TABLET | Freq: Every day | ORAL | Status: DC
  Administered 2016-02-25 – 2016-02-27 (×3): 12.5 mg via ORAL
  Filled 2016-02-24 (×3): qty 1

## 2016-02-24 MED ORDER — ASPIRIN EC 81 MG PO TBEC
81.0000 mg | DELAYED_RELEASE_TABLET | Freq: Every morning | ORAL | Status: DC
  Administered 2016-02-25: 81 mg via ORAL
  Filled 2016-02-24: qty 1

## 2016-02-24 MED ORDER — ASPIRIN EC 81 MG PO TBEC
81.0000 mg | DELAYED_RELEASE_TABLET | Freq: Every day | ORAL | Status: DC
  Administered 2016-02-25 – 2016-02-27 (×3): 81 mg via ORAL
  Filled 2016-02-24 (×2): qty 1

## 2016-02-24 MED ORDER — TIOTROPIUM BROMIDE MONOHYDRATE 2.5 MCG/ACT IN AERS
2.0000 | INHALATION_SPRAY | Freq: Every day | RESPIRATORY_TRACT | Status: DC
  Filled 2016-02-24 (×4): qty 1

## 2016-02-24 MED ORDER — ALBUTEROL SULFATE (2.5 MG/3ML) 0.083% IN NEBU: 2.5000 mg | INHALATION_SOLUTION | RESPIRATORY_TRACT | Status: DC | PRN

## 2016-02-24 MED ORDER — ALBUTEROL (5 MG/ML) CONTINUOUS INHALATION SOLN
10.0000 mg/h | INHALATION_SOLUTION | RESPIRATORY_TRACT | Status: DC
  Administered 2016-02-24: 10 mg/h via RESPIRATORY_TRACT
  Filled 2016-02-24: qty 20

## 2016-02-24 MED ORDER — ALBUTEROL SULFATE (2.5 MG/3ML) 0.083% IN NEBU
2.5000 mg | INHALATION_SOLUTION | Freq: Four times a day (QID) | RESPIRATORY_TRACT | Status: DC
  Administered 2016-02-24 – 2016-02-27 (×11): 2.5 mg via RESPIRATORY_TRACT
  Filled 2016-02-24 (×11): qty 3

## 2016-02-24 MED ORDER — ONDANSETRON HCL 4 MG/2ML IJ SOLN: 4.0000 mg | Freq: Four times a day (QID) | INTRAMUSCULAR | Status: DC | PRN

## 2016-02-24 MED ORDER — OXYCODONE-ACETAMINOPHEN 5-325 MG PO TABS
0.5000 | ORAL_TABLET | Freq: Three times a day (TID) | ORAL | Status: DC | PRN
Start: 2016-02-24 — End: 2016-02-26
  Administered 2016-02-24 – 2016-02-26 (×5): 0.5 via ORAL
  Filled 2016-02-24 (×5): qty 1

## 2016-02-24 MED ORDER — METHYLPREDNISOLONE SODIUM SUCC 40 MG IJ SOLR
40.0000 mg | Freq: Four times a day (QID) | INTRAMUSCULAR | Status: DC
  Administered 2016-02-25 – 2016-02-27 (×8): 40 mg via INTRAVENOUS
  Filled 2016-02-24 (×10): qty 1

## 2016-02-24 MED ORDER — LEVOFLOXACIN 500 MG PO TABS
500.0000 mg | ORAL_TABLET | Freq: Every day | ORAL | Status: DC
  Administered 2016-02-25 – 2016-02-27 (×3): 500 mg via ORAL
  Filled 2016-02-24 (×3): qty 1

## 2016-02-24 MED ORDER — IPRATROPIUM BROMIDE 0.02 % IN SOLN
0.5000 mg | Freq: Once | RESPIRATORY_TRACT | Status: AC
  Administered 2016-02-24: 0.5 mg via RESPIRATORY_TRACT
  Filled 2016-02-24: qty 2.5

## 2016-02-24 MED ORDER — ACETAMINOPHEN 650 MG RE SUPP: 650.0000 mg | Freq: Four times a day (QID) | RECTAL | Status: DC | PRN

## 2016-02-24 MED ORDER — NYSTATIN 100000 UNIT/ML MT SUSP
5.0000 mL | Freq: Four times a day (QID) | OROMUCOSAL | Status: DC
  Administered 2016-02-25 – 2016-02-27 (×11): 500000 [IU] via OROMUCOSAL
  Filled 2016-02-24 (×10): qty 5

## 2016-02-24 MED ORDER — MAGNESIUM SULFATE 2 GM/50ML IV SOLN
2.0000 g | Freq: Once | INTRAVENOUS | Status: AC
  Administered 2016-02-24: 2 g via INTRAVENOUS
  Filled 2016-02-24: qty 50

## 2016-02-24 NOTE — ED Provider Notes (Signed)
CSN: [id number]     Arrival date & time 02/24/16  1340 History   First MD Initiated Contact with Patient 02/24/16 1414     Chief Complaint  Patient presents with  . Shortness of Breath     (Consider location/radiation/quality/duration/timing/severity/associated sxs/prior Treatment) HPI Comments: Patient with history of COPD on home oxygen 2 L at all times presenting with worsening cough, congestion and shortness of breath over the past week. She saw her PCP on Tuesday and had her chronic prednisone increased from 10 mg to 40 mg and she is prescribed amoxicillin for apparent sinusitis. She reports increased difficulty breathing, cough productive of clear mucus, body aches and chills. No fever. Some chest tightness with coughing. She states she's been only using her  nebulizer at home only once or twice a day. denies any vomiting or abdominal pain. No leg swelling. She did receive a flu shot. She has had sick contacts at home. She continues to smoke.  The history is provided by the patient. The history is limited by the condition of the patient.    Past Medical History  Diagnosis Date  . COPD (chronic obstructive pulmonary disease) ([place])   . Shortness of breath   . Compression fracture of lumbar vertebra ([place])   . Hyperglycemia, drug-induced   . Hyperlipidemia   . Osteoporosis 2013  . Tobacco abuse   . Chronic respiratory failure ([place]) On home 02 2-3L  . Thyroid disease     pt denies  . Adrenal adenoma 2014    Left  . Kidney stones 2014    Left side, multiple  . Arthritis    Past Surgical History  Procedure Laterality Date  . Tubal ligation    . Cervical biopsy    . Colonoscopy N/A 03/31/2013    Procedure: COLONOSCOPY;  Surgeon: Rogene Houston, MD;  Location: [facility];  Service: Endoscopy;  Laterality: N/A;  730   Family History  Problem Relation Age of Onset  . Heart disease Mother   . Hypertension Sister   . Hypertension Brother    Social History  Substance Use  Topics  . Smoking status: Current Every Day Smoker -- 1.00 packs/day for 25 years    Types: Cigarettes  . Smokeless tobacco: Never Used  . Alcohol Use: No   OB History    No data available     Review of Systems  Constitutional: Positive for activity change, appetite change and fatigue. Negative for fever.  HENT: Positive for congestion and rhinorrhea.   Eyes: Negative for visual disturbance.  Respiratory: Positive for cough, chest tightness and shortness of breath.   Cardiovascular: Negative for chest pain and leg swelling.  Gastrointestinal: Negative for nausea, vomiting and abdominal pain.  Genitourinary: Negative for dysuria and hematuria.  Musculoskeletal: Positive for myalgias and arthralgias.  Skin: Negative for pallor.  Neurological: Negative for dizziness, weakness and headaches.   A complete 10 system review of systems was obtained and all systems are negative except as noted in the HPI and PMH.     Allergies  Keflex; Doxycycline; Sulfa antibiotics; Avelox; and Toradol  Home Medications   Prior to Admission medications   Medication Sig Start Date End Date Taking? Authorizing Provider  albuterol (PROVENTIL HFA;VENTOLIN HFA) 108 (90 Base) MCG/ACT inhaler Inhale 2 puffs into the lungs every 6 (six) hours as needed for wheezing or shortness of breath. 02/19/16  Yes Alycia Rossetti, MD  albuterol (PROVENTIL) (2.5 MG/3ML) 0.083% nebulizer solution Take 3 mLs (2.5 mg total)  by nebulization every 4 (four) hours as needed for wheezing or shortness of breath. 10/09/14 02/18/17 Yes Alycia Rossetti, MD  amoxicillin (AMOXIL) 875 MG tablet Take 1 tablet (875 mg total) by mouth 2 (two) times daily. 02/19/16  Yes Alycia Rossetti, MD  aspirin EC 81 MG tablet Take 81 mg by mouth every morning.    Yes Historical Provider, MD  budesonide-formoterol (SYMBICORT) 160-4.5 MCG/ACT inhaler Inhale 2 puffs into the lungs 2 (two) times daily. 04/16/15 04/15/16 Yes Alycia Rossetti, MD  Calcium  Carbonate-Vitamin D (CALCIUM 600/VITAMIN D) 600-400 MG-UNIT per tablet Take 1 tablet by mouth 2 (two) times daily.   Yes Historical Provider, MD  fluticasone (FLONASE) 50 MCG/ACT nasal spray Place 1 spray into both nostrils daily as needed for allergies or rhinitis.   Yes Historical Provider, MD  hydrochlorothiazide (HYDRODIURIL) 12.5 MG tablet Take 1 tablet (12.5 mg total) by mouth as needed. 02/19/16  Yes Alycia Rossetti, MD  Multiple Vitamin (MULITIVITAMIN WITH MINERALS) TABS Take 1 tablet by mouth every morning.    Yes Historical Provider, MD  nystatin (MYCOSTATIN) 100000 UNIT/ML suspension Use as directed 5 mLs (500,000 Units total) in the mouth or throat 4 (four) times daily. 10/19/15  Yes Alycia Rossetti, MD  oxyCODONE-acetaminophen (ROXICET) 5-325 MG per tablet Take 1 tablet by mouth every 8 (eight) hours as needed for severe pain. Patient taking differently: Take 0.5 tablets by mouth every 8 (eight) hours as needed for severe pain.  09/25/15  Yes Alycia Rossetti, MD  pantoprazole (PROTONIX) 40 MG tablet take 1 tablet by mouth once daily 09/25/15  Yes Alycia Rossetti, MD  predniSONE (DELTASONE) 20 MG tablet Take 40mg  x 5 days Patient taking differently: Take 40 mg by mouth daily with breakfast.  02/19/16  Yes Alycia Rossetti, MD  promethazine-codeine [facility] WITH CODEINE) 6.25-10 MG/5ML syrup Take 5 mLs by mouth every 6 (six) hours as needed for cough. 02/19/16  Yes Alycia Rossetti, MD  simvastatin (ZOCOR) 20 MG tablet Take 1 tablet (20 mg total) by mouth at bedtime. 09/25/15  Yes Alycia Rossetti, MD  Tiotropium Bromide Monohydrate (SPIRIVA RESPIMAT) 2.5 MCG/ACT AERS Inhale 2 sprays into the lungs daily. Patient taking differently: Inhale 2 sprays into the lungs at bedtime.  08/13/15  Yes Alycia Rossetti, MD  albuterol (PROVENTIL) (2.5 MG/3ML) 0.083% nebulizer solution Take 3 mLs (2.5 mg total) by nebulization every 6 (six) hours as needed for wheezing or shortness of breath. 11/06/15    Alycia Rossetti, MD  alendronate (FOSAMAX) 70 MG tablet Take 1 tablet (70 mg total) by mouth every 7 (seven) days. Take with a full glass of water on an empty stomach.Marlana Latus) 09/25/15 09/24/16  Alycia Rossetti, MD  predniSONE (DELTASONE) 10 MG tablet Take 1 tablet (10 mg total) by mouth daily. 10/03/15   Collene Gobble, MD   BP 112/70 mmHg  Pulse 106  Temp(Src) 97.8 F (36.6 C) (Oral)  Resp 18  Ht 5\' 1"  (1.549 m)  Wt 169 lb (76.658 kg)  BMI 31.95 kg/m2  SpO2 90% Physical Exam  Constitutional: She is oriented to person, place, and time. She appears well-developed and well-nourished. She appears distressed.  Moderate respiratory distress, speaking in short phrases  HENT:  Head: Normocephalic and atraumatic.  Mouth/Throat: Oropharynx is clear and moist. No oropharyngeal exudate.  Eyes: Conjunctivae and EOM are normal. Pupils are equal, round, and reactive to light.  Neck: Normal range of motion. Neck supple.  No meningismus.  Cardiovascular: Normal rate, regular rhythm, normal heart sounds and intact distal pulses.   No murmur heard. Pulmonary/Chest: She is in respiratory distress. She has wheezes.  Fair air exchange with diffuse expiratory wheezing throughout.  Abdominal: Soft. There is no tenderness. There is no rebound and no guarding.  Musculoskeletal: Normal range of motion. She exhibits no edema or tenderness.  Neurological: She is alert and oriented to person, place, and time. No cranial nerve deficit. She exhibits normal muscle tone. Coordination normal.  No ataxia on finger to nose bilaterally. No pronator drift. 5/5 strength throughout. CN 2-12 intact.Equal grip strength. Sensation intact.   Skin: Skin is warm.  Psychiatric: She has a normal mood and affect. Her behavior is normal.  Nursing note and vitals reviewed.   ED Course  Procedures (including critical care time) Labs Review Labs Reviewed  BLOOD GAS, ARTERIAL - Abnormal; Notable for the following:    pCO2  arterial 66.3 (*)    Bicarbonate 32.1 (*)    Acid-Base Excess 10.6 (*)    All other components within normal limits  CBC WITH DIFFERENTIAL/PLATELET - Abnormal; Notable for the following:    WBC 14.5 (*)    Hemoglobin 15.3 (*)    HCT 48.4 (*)    MCV 100.4 (*)    Neutro Abs 12.9 (*)    All other components within normal limits  BASIC METABOLIC PANEL - Abnormal; Notable for the following:    Chloride 97 (*)    CO2 38 (*)    Glucose, Bld 191 (*)    All other components within normal limits  BRAIN NATRIURETIC PEPTIDE  TROPONIN I    Imaging Review Dg Chest Portable 1 View  02/24/2016  CLINICAL DATA:  Shortness of breath for few days EXAM: PORTABLE CHEST 1 VIEW COMPARISON:  11/07/2013 FINDINGS: The heart size and mediastinal contours are within normal limits. Both lungs are clear. The visualized skeletal structures are unremarkable. IMPRESSION: No active disease. Electronically Signed   By: Inez Catalina M.D.   On: 02/24/2016 14:58   I have personally reviewed and evaluated these images and lab results as part of my medical decision-making.   EKG Interpretation   Date/Time:  Sunday February 24 2016 13:54:36 EST Ventricular Rate:  122 PR Interval:  129 QRS Duration: 94 QT Interval:  328 QTC Calculation: 467 R Axis:   94 Text Interpretation:  Sinus tachycardia Ventricular premature complex  Consider right atrial enlargement Borderline right axis deviation  Nonspecific T abnormalities, lateral leads Baseline wander in lead(s) V6  Rate faster Nonspecific ST and T wave abnormality Confirmed by Tenesha Garza   MD, Lulia Schriner [phone]) on 02/24/2016 2:27:11 PM      MDM   Final diagnoses:  COPD exacerbation (HCC)   COPD on home oxygen presenting with respiratory distress and diffuse wheezing.  Nebulizers, steroids, CXR.   chest x-ray negative. Patient given continuous nebulizer and Solu-Medrol. ABG shows PCO2 66 with compensated pH of 7.35.   Patient continues to wheeze and his tachypnea  with conversation. She will need admission for further therapy. D/w Dr. Marin Comment.  dyspenic with conversation but stable off bipap.  Compensated on ABG.  Continue bronchodilators and further treatments.   CRITICAL CARE Performed by: Ezequiel Essex Total critical care time: 35 minutes Critical care time was exclusive of separately billable procedures and treating other patients. Critical care was necessary to treat or prevent imminent or life-threatening deterioration. Critical care was time spent personally by me on the following activities: development of treatment plan with  patient and/or surrogate as well as nursing, discussions with consultants, evaluation of patient's response to treatment, examination of patient, obtaining history from patient or surrogate, ordering and performing treatments and interventions, ordering and review of laboratory studies, ordering and review of radiographic studies, pulse oximetry and re-evaluation of patient's condition.     Ezequiel Essex, MD 02/24/16 Carollee Massed

## 2016-02-24 NOTE — ED Notes (Signed)
RT called

## 2016-02-24 NOTE — ED Notes (Signed)
Bipap removed per hospitalist at 1640.

## 2016-02-24 NOTE — ED Notes (Signed)
CRITICAL VALUE ALERT  Critical value received:  PH 7.35, pCO2 66.3, pO2 81.3, HCO3 32.1  Date of notification:  02/24/16  Time of notification:  [id number]  Critical value read back:Yes.    Nurse who received alert:  Laurell Josephs RN  MD notified (1st page):  [place]  Time of first page:  1525  MD notified (2nd page):  Time of second page:  Responding MD:  Rancour  Time MD responded:  1525

## 2016-02-24 NOTE — H&P (Signed)
[facility] History and Physical  Cindy [name] [id number] DOB: [birth year]    PCP:   Vic Blackbird, MD   Chief Complaint: SOB and increase DOE.   HPI: Cindy [name] is an 60 y.o. female with hx of severe COPD ( FEV1 of 19 % predicted on prior PFT !!!), smoker, on home oxygen, hx of HLD, thyroid disease, presented to the ER with SOB.  She denied productive coughs, fever, chills, nausea vomiting or chest pain.  Evalaution in the ER Included a CXR which showed no infiltrate, WBC of 14K, and Hb of 15 g per dL.  Her ABG showed 7.35/ 66/ Pox= 81/94% on 2L.  She was placed on Bipap by EDP due to hypercapnea,  given IV Steroid and nebs, and hospitalist was asked to admit her for acute on chronic respiratory failure.  She had wanted to leave AMA, but later decided to come in.   Rewiew of Systems:  Constitutional: Negative for malaise, fever and chills. No significant weight loss or weight gain Eyes: Negative for eye pain, redness and discharge, diplopia, visual changes, or flashes of light. ENMT: Negative for ear pain, hoarseness, nasal congestion, sinus pressure and sore throat. No headaches; tinnitus, drooling, or problem swallowing. Cardiovascular: Negative for chest pain, palpitations, diaphoresis,  and peripheral edema. ; No orthopnea, PND Respiratory: Negative for cough, hemoptysis, wheezing and stridor. No pleuritic chestpain. Gastrointestinal: Negative for nausea, vomiting, diarrhea, constipation, abdominal pain, melena, blood in stool, hematemesis, jaundice and rectal bleeding.    Genitourinary: Negative for frequency, dysuria, incontinence,flank pain and hematuria; Musculoskeletal: Negative for back pain and neck pain. Negative for swelling and trauma.;  Skin: . Negative for pruritus, rash, abrasions, bruising and skin lesion.; ulcerations Neuro: Negative for headache, lightheadedness and neck stiffness. Negative for weakness, altered level of consciousness , altered mental  status, extremity weakness, burning feet, involuntary movement, seizure and syncope.  Psych: negative for anxiety, depression, insomnia, tearfulness, panic attacks, hallucinations, paranoia, suicidal or homicidal ideation   Past Medical History  Diagnosis Date  . COPD (chronic obstructive pulmonary disease) ([place])   . Shortness of breath   . Compression fracture of lumbar vertebra ([place])   . Hyperglycemia, drug-induced   . Hyperlipidemia   . Osteoporosis 2013  . Tobacco abuse   . Chronic respiratory failure ([place]) On home 02 2-3L  . Thyroid disease     pt denies  . Adrenal adenoma 2014    Left  . Kidney stones 2014    Left side, multiple  . Arthritis     Past Surgical History  Procedure Laterality Date  . Tubal ligation    . Cervical biopsy    . Colonoscopy N/A 03/31/2013    Procedure: COLONOSCOPY;  Surgeon: Rogene Houston, MD;  Location: [facility];  Service: Endoscopy;  Laterality: N/A;  730    Medications:  HOME MEDS: Prior to Admission medications   Medication Sig Start Date End Date Taking? Authorizing Provider  albuterol (PROVENTIL HFA;VENTOLIN HFA) 108 (90 Base) MCG/ACT inhaler Inhale 2 puffs into the lungs every 6 (six) hours as needed for wheezing or shortness of breath. 02/19/16  Yes Alycia Rossetti, MD  albuterol (PROVENTIL) (2.5 MG/3ML) 0.083% nebulizer solution Take 3 mLs (2.5 mg total) by nebulization every 4 (four) hours as needed for wheezing or shortness of breath. 10/09/14 02/18/17 Yes Alycia Rossetti, MD  amoxicillin (AMOXIL) 875 MG tablet Take 1 tablet (875 mg total) by mouth 2 (two) times daily. 02/19/16  Yes Alycia Rossetti,  MD  aspirin EC 81 MG tablet Take 81 mg by mouth every morning.    Yes Historical Provider, MD  budesonide-formoterol (SYMBICORT) 160-4.5 MCG/ACT inhaler Inhale 2 puffs into the lungs 2 (two) times daily. 04/16/15 04/15/16 Yes Alycia Rossetti, MD  Calcium Carbonate-Vitamin D (CALCIUM 600/VITAMIN D) 600-400 MG-UNIT per tablet Take 1  tablet by mouth 2 (two) times daily.   Yes Historical Provider, MD  fluticasone (FLONASE) 50 MCG/ACT nasal spray Place 1 spray into both nostrils daily as needed for allergies or rhinitis.   Yes Historical Provider, MD  hydrochlorothiazide (HYDRODIURIL) 12.5 MG tablet Take 1 tablet (12.5 mg total) by mouth as needed. 02/19/16  Yes Alycia Rossetti, MD  Multiple Vitamin (MULITIVITAMIN WITH MINERALS) TABS Take 1 tablet by mouth every morning.    Yes Historical Provider, MD  nystatin (MYCOSTATIN) 100000 UNIT/ML suspension Use as directed 5 mLs (500,000 Units total) in the mouth or throat 4 (four) times daily. 10/19/15  Yes Alycia Rossetti, MD  oxyCODONE-acetaminophen (ROXICET) 5-325 MG per tablet Take 1 tablet by mouth every 8 (eight) hours as needed for severe pain. Patient taking differently: Take 0.5 tablets by mouth every 8 (eight) hours as needed for severe pain.  09/25/15  Yes Alycia Rossetti, MD  pantoprazole (PROTONIX) 40 MG tablet take 1 tablet by mouth once daily 09/25/15  Yes Alycia Rossetti, MD  predniSONE (DELTASONE) 20 MG tablet Take 40mg  x 5 days Patient taking differently: Take 40 mg by mouth daily with breakfast.  02/19/16  Yes Alycia Rossetti, MD  promethazine-codeine [facility] WITH CODEINE) 6.25-10 MG/5ML syrup Take 5 mLs by mouth every 6 (six) hours as needed for cough. 02/19/16  Yes Alycia Rossetti, MD  simvastatin (ZOCOR) 20 MG tablet Take 1 tablet (20 mg total) by mouth at bedtime. 09/25/15  Yes Alycia Rossetti, MD  Tiotropium Bromide Monohydrate (SPIRIVA RESPIMAT) 2.5 MCG/ACT AERS Inhale 2 sprays into the lungs daily. Patient taking differently: Inhale 2 sprays into the lungs at bedtime.  08/13/15  Yes Alycia Rossetti, MD  albuterol (PROVENTIL) (2.5 MG/3ML) 0.083% nebulizer solution Take 3 mLs (2.5 mg total) by nebulization every 6 (six) hours as needed for wheezing or shortness of breath. 11/06/15   Alycia Rossetti, MD  alendronate (FOSAMAX) 70 MG tablet Take 1 tablet (70 mg  total) by mouth every 7 (seven) days. Take with a full glass of water on an empty stomach.Marlana Latus) 09/25/15 09/24/16  Alycia Rossetti, MD  predniSONE (DELTASONE) 10 MG tablet Take 1 tablet (10 mg total) by mouth daily. 10/03/15   Collene Gobble, MD     Allergies:  Allergies  Allergen Reactions  . Keflex [Cephalexin] Anaphylaxis and Rash  . Doxycycline Other (See Comments)    Gave patient oral thrush  . Sulfa Antibiotics   . Avelox [Moxifloxacin Hcl In Nacl] Itching and Rash  . Toradol [Ketorolac Tromethamine] Swelling, Rash and Other (See Comments)    Bruising, swelling, rash at injection site    Social History:   reports that she has been smoking Cigarettes.  She has a 25 pack-year smoking history. She has never used smokeless tobacco. She reports that she does not drink alcohol or use illicit drugs.  Family History: Family History  Problem Relation Age of Onset  . Heart disease Mother   . Hypertension Sister   . Hypertension Brother      Physical Exam: Filed Vitals:   02/24/16 1445 02/24/16 1456 02/24/16 1500 02/24/16 1625  BP:  143/92   Pulse: 118  115 113  Temp:      TempSrc:      Resp: 27  15 22   Height:      Weight:      SpO2: 96% 96% 96% 95%   Blood pressure 143/92, pulse 113, temperature 97.8 F (36.6 C), temperature source Oral, resp. rate 22, height 5\' 1"  (1.549 m), weight 76.658 kg (169 lb), SpO2 95 %.  GEN:  Pleasant  patient lying in the stretcher in no acute distress; cooperative with exam. PSYCH:  alert and oriented x4; does not appear anxious or depressed; affect is appropriate. HEENT: Mucous membranes pink and anicteric; PERRLA; EOM intact; no cervical lymphadenopathy nor thyromegaly or carotid bruit; no JVD; There were no stridor. Neck is very supple. Breasts:: Not examined CHEST WALL: No tenderness CHEST: Normal respiration, she has bilateral wheezing.  HEART: Regular rate and rhythm.  There are no murmur, rub, or gallops.   BACK: No kyphosis or  scoliosis; no CVA tenderness ABDOMEN: soft and non-tender; no masses, no organomegaly, normal abdominal bowel sounds; no pannus; no intertriginous candida. There is no rebound and no distention. Rectal Exam: Not done EXTREMITIES: No bone or joint deformity; age-appropriate arthropathy of the hands and knees; no edema; no ulcerations.  There is no calf tenderness. Genitalia: not examined PULSES: 2+ and symmetric SKIN: Normal hydration no rash or ulceration CNS: Cranial nerves 2-12 grossly intact no focal lateralizing neurologic deficit.  Speech is fluent; uvula elevated with phonation, facial symmetry and tongue midline. DTR are normal bilaterally, cerebella exam is intact, barbinski is negative and strengths are equaled bilaterally.  No sensory loss.   Labs on Admission:  Basic Metabolic Panel:  Recent Labs Lab 02/24/16 1439  NA 143  K 4.7  CL 97*  CO2 38*  GLUCOSE 191*  BUN 15  CREATININE 0.81  CALCIUM 10.0   Liver Function Tests: CBC:  Recent Labs Lab 02/24/16 1448  WBC 14.5*  NEUTROABS 12.9*  HGB 15.3*  HCT 48.4*  MCV 100.4*  PLT 249   Cardiac Enzymes:  Recent Labs Lab 02/24/16 1439  TROPONINI <0.03    CBG: No results for input(s): GLUCAP in the last 168 hours.   Radiological Exams on Admission: Dg Chest Portable 1 View  02/24/2016  CLINICAL DATA:  Shortness of breath for few days EXAM: PORTABLE CHEST 1 VIEW COMPARISON:  11/07/2013 FINDINGS: The heart size and mediastinal contours are within normal limits. Both lungs are clear. The visualized skeletal structures are unremarkable. IMPRESSION: No active disease. Electronically Signed   By: Inez Catalina M.D.   On: 02/24/2016 14:58    EKG: Independently reviewed.   Assessment/Plan Present on Admission:  . COPD exacerbation ([place]) . GERD (gastroesophageal reflux disease) . Hyperlipidemia . Hypoxia . Tobacco abuse  PLAN:  COPD Exacerbation:  She clearly has chronic CO2 retainer and compensated well with  good pH on her ABG, along with elevated bicarb on her serum blood test, and she looks well, so she doesn't need bipap.  Will d/c Bipap, admit her to telemetry, continue with oxygen and IV Steroids,   Will continue with antibiotics.  She may benefit with Acetozamide, but will hold off on this.    GERD: Will tx aggressively to avoid GERD induced bronchospasm.  HLD:  Continue with her statin.  Tobacco abuse:  Advised stop smoking, obviously.  Her FEV1 was 19 % predicted.   Other plans as per orders. Code Status: FULL Haskel Khan, MD. Rosalita Chessman  [facility] Pager [phone] 7pm to 7am.  02/24/2016, 5:13 PM

## 2016-02-24 NOTE — ED Notes (Addendum)
Pt in on O2 at @2L  at all times. Pt states she was seen by PCP on Tuesday and put on Amoxicillin  due to chest and nasal congestion. Pt also states headaches. Symptoms started last Friday.  Pt also states O2 sats drops to the 80s with any activity (while wearing O2)

## 2016-02-25 DIAGNOSIS — F17210 Nicotine dependence, cigarettes, uncomplicated: Secondary | ICD-10-CM | POA: Diagnosis present

## 2016-02-25 DIAGNOSIS — Z720 Tobacco use: Secondary | ICD-10-CM | POA: Diagnosis not present

## 2016-02-25 DIAGNOSIS — J9621 Acute and chronic respiratory failure with hypoxia: Secondary | ICD-10-CM | POA: Diagnosis present

## 2016-02-25 DIAGNOSIS — K210 Gastro-esophageal reflux disease with esophagitis: Secondary | ICD-10-CM | POA: Diagnosis present

## 2016-02-25 DIAGNOSIS — K219 Gastro-esophageal reflux disease without esophagitis: Secondary | ICD-10-CM | POA: Diagnosis not present

## 2016-02-25 DIAGNOSIS — E785 Hyperlipidemia, unspecified: Secondary | ICD-10-CM | POA: Diagnosis present

## 2016-02-25 DIAGNOSIS — Z8249 Family history of ischemic heart disease and other diseases of the circulatory system: Secondary | ICD-10-CM | POA: Diagnosis not present

## 2016-02-25 DIAGNOSIS — E079 Disorder of thyroid, unspecified: Secondary | ICD-10-CM | POA: Diagnosis present

## 2016-02-25 DIAGNOSIS — J441 Chronic obstructive pulmonary disease with (acute) exacerbation: Secondary | ICD-10-CM | POA: Diagnosis not present

## 2016-02-25 LAB — BASIC METABOLIC PANEL
ANION GAP: 8 (ref 5–15)
BUN: 15 mg/dL (ref 6–20)
CALCIUM: 9 mg/dL (ref 8.9–10.3)
CO2: 34 mmol/L — AB (ref 22–32)
Chloride: 98 mmol/L — ABNORMAL LOW (ref 101–111)
Creatinine, Ser: 0.72 mg/dL (ref 0.44–1.00)
GLUCOSE: 123 mg/dL — AB (ref 65–99)
POTASSIUM: 4.4 mmol/L (ref 3.5–5.1)
Sodium: 140 mmol/L (ref 135–145)

## 2016-02-25 LAB — INFLUENZA PANEL BY PCR (TYPE A & B)
H1N1 flu by pcr: NOT DETECTED
INFLAPCR: NEGATIVE
INFLBPCR: NEGATIVE

## 2016-02-25 LAB — CBC
HEMATOCRIT: 44.7 % (ref 36.0–46.0)
HEMOGLOBIN: 14.1 g/dL (ref 12.0–15.0)
MCH: 30.9 pg (ref 26.0–34.0)
MCHC: 31.5 g/dL (ref 30.0–36.0)
MCV: 98 fL (ref 78.0–100.0)
Platelets: 250 10*3/uL (ref 150–400)
RBC: 4.56 MIL/uL (ref 3.87–5.11)
RDW: 13.8 % (ref 11.5–15.5)
WBC: 17.1 10*3/uL — ABNORMAL HIGH (ref 4.0–10.5)

## 2016-02-25 MED ORDER — TIOTROPIUM BROMIDE MONOHYDRATE 18 MCG IN CAPS
ORAL_CAPSULE | RESPIRATORY_TRACT | Status: AC
  Filled 2016-02-25: qty 5

## 2016-02-25 MED ORDER — GUAIFENESIN ER 600 MG PO TB12
600.0000 mg | ORAL_TABLET | Freq: Two times a day (BID) | ORAL | Status: DC
  Administered 2016-02-25 – 2016-02-27 (×4): 600 mg via ORAL
  Filled 2016-02-25 (×4): qty 1

## 2016-02-25 MED ORDER — DEXTROMETHORPHAN POLISTIREX ER 30 MG/5ML PO SUER
30.0000 mg | Freq: Two times a day (BID) | ORAL | Status: DC | PRN
Start: 2016-02-25 — End: 2016-02-27
  Administered 2016-02-26: 30 mg via ORAL
  Filled 2016-02-25 (×2): qty 5

## 2016-02-25 MED ORDER — MOMETASONE FURO-FORMOTEROL FUM 200-5 MCG/ACT IN AERO
2.0000 | INHALATION_SPRAY | Freq: Two times a day (BID) | RESPIRATORY_TRACT | Status: DC
  Filled 2016-02-25: qty 8.8

## 2016-02-25 MED ORDER — BUDESONIDE-FORMOTEROL FUMARATE 160-4.5 MCG/ACT IN AERO
2.0000 | INHALATION_SPRAY | Freq: Two times a day (BID) | RESPIRATORY_TRACT | Status: DC
  Administered 2016-02-25 – 2016-02-27 (×4): 2 via RESPIRATORY_TRACT
  Filled 2016-02-25: qty 6

## 2016-02-25 NOTE — Care Management Note (Addendum)
Case Management Note  Patient Details  Name: Cindy [name] MRN: [id number] Date of Birth: August 20, 1956  Subjective/Objective:                  Pt admitted with COPD. Pt is from home, lives with friend and is ind with ADL's. Pt has home O2 and neb machine from [place]. Pt plans to return home with self care.   Action/Plan: No CM needs.   Expected Discharge Date:     02/26/2016             Expected Discharge Plan:  Home/Self Care  In-House Referral:  NA  Discharge planning Services  CM Consult  Post Acute Care Choice:  NA Choice offered to:  NA  DME Arranged:    DME Agency:     HH Arranged:    HH Agency:     Status of Service:  Completed, signed off  Medicare Important Message Given:    Date Medicare IM Given:    Medicare IM give by:    Date Additional Medicare IM Given:    Additional Medicare Important Message give by:     If discussed at [place] of Stay Meetings, dates discussed:    Additional Comments:  Sherald Barge, RN 02/25/2016, 3:31 PM

## 2016-02-25 NOTE — Care Management Obs Status (Signed)
[place] NOTIFICATION   Patient Details  Name: Cindy [name] MRN: [id number] Date of Birth: 1956/03/16   Medicare Observation Status Notification Given:  Yes    Sherald Barge, RN 02/25/2016, 3:31 PM

## 2016-02-25 NOTE — Progress Notes (Signed)
[facility] PROGRESS NOTE  AISSATA [name] [id number] DOB: [birth year] DOA: 02/24/2016 PCP: Vic Blackbird, MD  Assessment/Plan: Acute on Chronic Hypoxemic Respiratory Failure -Due to COPD. -Continue oxygen supplementation.  COPD with acute exacerbation -Still with significant wheezing. -Continue steroids, nebs, levaquin. -Flu PCR pending  Tobacco Abuse -Counseled on cessation  Code Status: Full Code Family Communication: husband Louie Casa at bedside updated on plan of care and all questions answered.  Disposition Plan: DC home when ready, anticipate 48 hours   Consultants:  None   Antibiotics:  levaquin   Subjective: Still feels SOB  Objective: Filed Vitals:   02/25/16 0143 02/25/16 0618 02/25/16 0732 02/25/16 1241  BP:  153/90    Pulse:  97    Temp:  98 F (36.7 C)    TempSrc:  Oral    Resp:  20    Height:      Weight:      SpO2: 93% 95% 94% 93%    Intake/Output Summary (Last 24 hours) at 02/25/16 1457 Last data filed at 02/25/16 0944  Gross per 24 hour  Intake    240 ml  Output      0 ml  Net    240 ml   Filed Weights   02/24/16 1345 02/24/16 1846  Weight: 76.658 kg (169 lb) 75.705 kg (166 lb 14.4 oz)    Exam:   General:  AA Ox3  Cardiovascular: RRR  Respiratory: diffused bilateral expiratory wheezing  Abdomen: S/NT/ND/+BS  Extremities: no C/C/E   Neurologic:  Intact/non-focal  Data Reviewed: Basic Metabolic Panel:  Recent Labs Lab 02/24/16 1439 02/25/16 0534  NA 143 140  K 4.7 4.4  CL 97* 98*  CO2 38* 34*  GLUCOSE 191* 123*  BUN 15 15  CREATININE 0.81 0.72  CALCIUM 10.0 9.0   Liver Function Tests: No results for input(s): AST, ALT, ALKPHOS, BILITOT, PROT, ALBUMIN in the last 168 hours. No results for input(s): LIPASE, AMYLASE in the last 168 hours. No results for input(s): AMMONIA in the last 168 hours. CBC:  Recent Labs Lab 02/24/16 1448 02/25/16 0534  WBC 14.5* 17.1*  NEUTROABS 12.9*  --   HGB 15.3*  14.1  HCT 48.4* 44.7  MCV 100.4* 98.0  PLT 249 250   Cardiac Enzymes:  Recent Labs Lab 02/24/16 1439  TROPONINI <0.03   BNP (last 3 results)  Recent Labs  02/24/16 1439  BNP 27.0    ProBNP (last 3 results) No results for input(s): PROBNP in the last 8760 hours.  CBG: No results for input(s): GLUCAP in the last 168 hours.  No results found for this or any previous visit (from the past 240 hour(s)).   Studies: Dg Chest Portable 1 View  02/24/2016  CLINICAL DATA:  Shortness of breath for few days EXAM: PORTABLE CHEST 1 VIEW COMPARISON:  11/07/2013 FINDINGS: The heart size and mediastinal contours are within normal limits. Both lungs are clear. The visualized skeletal structures are unremarkable. IMPRESSION: No active disease. Electronically Signed   By: Inez Catalina M.D.   On: 02/24/2016 14:58    Scheduled Meds: . albuterol  2.5 mg Nebulization Q6H  . aspirin EC  81 mg Oral q morning - 10a  . aspirin EC  81 mg Oral Daily  . enoxaparin (LOVENOX) injection  40 mg Subcutaneous Q24H  . hydrochlorothiazide  12.5 mg Oral Daily  . levofloxacin  500 mg Oral Daily  . methylPREDNISolone (SOLU-MEDROL) injection  40 mg Intravenous Q6H  . mometasone-formoterol  2  puff Inhalation BID  . nystatin  5 mL Mouth/Throat QID  . pantoprazole  40 mg Oral BID  . simvastatin  20 mg Oral QHS  . sodium chloride flush  3 mL Intravenous Q12H  . Tiotropium Bromide Monohydrate  2 spray Inhalation QHS   Continuous Infusions: . albuterol Stopped (02/24/16 1550)    Principal Problem:   COPD exacerbation ([facility]) Active Problems:   Tobacco abuse   Hyperlipidemia   Hypoxia   GERD (gastroesophageal reflux disease)    Time spent: 25 minutes. Greater than 50% of this time was spent in direct contact with the patient coordinating care.    Lelon Frohlich  [facility] Pager [phone]  If 7PM-7AM, please contact night-coverage at [email or web address], password [facility] 02/25/2016, 2:57 PM

## 2016-02-26 MED ORDER — OXYCODONE-ACETAMINOPHEN 5-325 MG PO TABS
1.0000 | ORAL_TABLET | Freq: Three times a day (TID) | ORAL | Status: DC | PRN
Start: 2016-02-26 — End: 2016-02-27
  Administered 2016-02-26 – 2016-02-27 (×2): 1 via ORAL
  Filled 2016-02-26 (×2): qty 1

## 2016-02-26 MED ORDER — FAMOTIDINE 20 MG PO TABS: 20.0000 mg | ORAL_TABLET | Freq: Two times a day (BID) | ORAL | Status: DC | PRN

## 2016-02-26 MED ORDER — ALUM & MAG HYDROXIDE-SIMETH 200-200-20 MG/5ML PO SUSP
30.0000 mL | Freq: Four times a day (QID) | ORAL | Status: DC | PRN
  Administered 2016-02-26 – 2016-02-27 (×2): 30 mL via ORAL
  Filled 2016-02-26 (×2): qty 30

## 2016-02-26 MED ORDER — HYDROCOD POLST-CPM POLST ER 10-8 MG/5ML PO SUER
5.0000 mL | Freq: Two times a day (BID) | ORAL | Status: DC | PRN
  Administered 2016-02-26 – 2016-02-27 (×2): 5 mL via ORAL
  Filled 2016-02-26 (×2): qty 5

## 2016-02-26 NOTE — Progress Notes (Signed)
[facility] PROGRESS NOTE  Cindy [name] [id number] DOB: June 28, 1956 DOA: 02/24/2016 PCP: Vic Blackbird, MD  Assessment/Plan: Acute on Chronic Hypoxemic Respiratory Failure -Due to COPD. -Continue oxygen supplementation.  COPD with acute exacerbation -Still with significant wheezing, altho improved from yesterday, -Continue steroids, nebs, levaquin. -Flu PCR negative  Tobacco Abuse -Counseled on cessation  Code Status: Full Code Family Communication: husband Louie Casa at bedside updated on plan of care and all questions answered 2/27 Disposition Plan: DC home when ready, anticipate 24-48 hours   Consultants:  None   Antibiotics:  levaquin   Subjective: Still feels SOB, altho much improved.  Objective: Filed Vitals:   02/26/16 0133 02/26/16 0545 02/26/16 0726 02/26/16 1327  BP:  127/79  138/81  Pulse:  98  106  Temp:  97.7 F (36.5 C)  98.4 F (36.9 C)  TempSrc:  Oral  Oral  Resp:  20  20  Height:      Weight:      SpO2: 93% 100% 99% 98%    Intake/Output Summary (Last 24 hours) at 02/26/16 1450 Last data filed at 02/26/16 1328  Gross per 24 hour  Intake    960 ml  Output   1400 ml  Net   -440 ml   Filed Weights   02/24/16 1345 02/24/16 1846  Weight: 76.658 kg (169 lb) 75.705 kg (166 lb 14.4 oz)    Exam:   General:  AA Ox3  Cardiovascular: RRR  Respiratory:  bilateral expiratory wheezing  Abdomen: S/NT/ND/+BS  Extremities: no C/C/E   Neurologic:  Intact/non-focal  Data Reviewed: Basic Metabolic Panel:  Recent Labs Lab 02/24/16 1439 02/25/16 0534  NA 143 140  K 4.7 4.4  CL 97* 98*  CO2 38* 34*  GLUCOSE 191* 123*  BUN 15 15  CREATININE 0.81 0.72  CALCIUM 10.0 9.0   Liver Function Tests: No results for input(s): AST, ALT, ALKPHOS, BILITOT, PROT, ALBUMIN in the last 168 hours. No results for input(s): LIPASE, AMYLASE in the last 168 hours. No results for input(s): AMMONIA in the last 168 hours. CBC:  Recent  Labs Lab 02/24/16 1448 02/25/16 0534  WBC 14.5* 17.1*  NEUTROABS 12.9*  --   HGB 15.3* 14.1  HCT 48.4* 44.7  MCV 100.4* 98.0  PLT 249 250   Cardiac Enzymes:  Recent Labs Lab 02/24/16 1439  TROPONINI <0.03   BNP (last 3 results)  Recent Labs  02/24/16 1439  BNP 27.0    ProBNP (last 3 results) No results for input(s): PROBNP in the last 8760 hours.  CBG: No results for input(s): GLUCAP in the last 168 hours.  No results found for this or any previous visit (from the past 240 hour(s)).   Studies: No results found.  Scheduled Meds: . albuterol  2.5 mg Nebulization Q6H  . aspirin EC  81 mg Oral Daily  . budesonide-formoterol  2 puff Inhalation BID  . enoxaparin (LOVENOX) injection  40 mg Subcutaneous Q24H  . guaiFENesin  600 mg Oral BID  . hydrochlorothiazide  12.5 mg Oral Daily  . levofloxacin  500 mg Oral Daily  . methylPREDNISolone (SOLU-MEDROL) injection  40 mg Intravenous Q6H  . nystatin  5 mL Mouth/Throat QID  . pantoprazole  40 mg Oral BID  . simvastatin  20 mg Oral QHS  . sodium chloride flush  3 mL Intravenous Q12H  . Tiotropium Bromide Monohydrate  2 spray Inhalation QHS   Continuous Infusions: . albuterol Stopped (02/24/16 1550)    Principal Problem:  COPD exacerbation ([facility]) Active Problems:   Tobacco abuse   Hyperlipidemia   Hypoxia   GERD (gastroesophageal reflux disease)    Time spent: 25 minutes. Greater than 50% of this time was spent in direct contact with the patient coordinating care.    Lelon Frohlich  [facility] Pager [phone]  If 7PM-7AM, please contact night-coverage at [email or web address], password [facility] 02/26/2016, 2:50 PM  LOS: 1 day

## 2016-02-27 DIAGNOSIS — J441 Chronic obstructive pulmonary disease with (acute) exacerbation: Principal | ICD-10-CM

## 2016-02-27 MED ORDER — PREDNISONE 20 MG PO TABS: 10.0000 mg | ORAL_TABLET | Freq: Every day | ORAL | Status: DC

## 2016-02-27 MED ORDER — HYDROCOD POLST-CPM POLST ER 10-8 MG/5ML PO SUER: 5.0000 mL | Freq: Two times a day (BID) | ORAL | Status: DC | PRN

## 2016-02-27 MED ORDER — LEVOFLOXACIN 500 MG PO TABS: 500.0000 mg | ORAL_TABLET | Freq: Every day | ORAL | Status: DC

## 2016-02-27 NOTE — Discharge Summary (Signed)
Physician Discharge Summary  Cindy [name] [id number] DOB: [birth year] DOA: 02/24/2016  PCP: Vic Blackbird, MD  Admit date: 02/24/2016 Discharge date: 02/27/2016  Time spent: 35 minutes  Recommendations for Outpatient Follow-up:  1. Patient may benefit from regular continued enforcement regarding smoking cessation moving forward 2. Patient was prescribed Levaquin ending 03/01/15 for COPD exacerbation 3. Patient given a burst of steroids 60 mg once a day for 5 days ending 03/03/16-suggest a long taper as an outpatient as per PCP back down to home dose of 10 mg daily 2 weeks 4. Suggest outpatient follow-up with pulmonology and repeat PFTs at the time  Discharge Diagnoses:  Principal Problem:   COPD exacerbation ([place]) Active Problems:   Tobacco abuse   Hyperlipidemia   Hypoxia   GERD (gastroesophageal reflux disease)   Discharge Condition: Improved  Diet recommendation: Heart healthy low-salt  Filed Weights   02/24/16 1345 02/24/16 1846  Weight: 76.658 kg (169 lb) 75.705 kg (166 lb 14.4 oz)    History of present illness:  60 year old female with 35 pack year history of smoking, still smoking 1 pack per day, severe COPD with FEV1 19% on prior PFTs, thyroid disease admitted 02/24/16 with shortness of breath. WBC showed white count of 14 ABG 7.35/66 She was placed on BiPAP initially given IV steroids and nebs See below  Hospital Course:  Patient was treated aggressively with IV steroids Solu-Medrol.  She did not require further BiPAP or other invasive procedures She was given a burst of steroids on discharge 60 mg of prednisone daily for 5 days We had long chat about smoking cessation and she says she cannot afford the $40 that it would cost to spend on patches yet she is able to smoke 1 pack per day which is $5 a day. I pointed this out her negative for her primary care physician to continue counseling. She was still wheezy on day of discharge but felt much better from her  perspective and wanted to go home We did prescribe 2 more days of Levaquin as well as a burst of steroids on discharge I have recommended that she follow-up as an outpatient with both pulmonology and primary care physician   Discharge Exam: Filed Vitals:   02/26/16 2138 02/27/16 0507  BP: 143/83 139/80  Pulse: 89 79  Temp: 98.7 F (37.1 C) 98.1 F (36.7 C)  Resp: 20 20    General:  EOMI NCAT Cardiovascular:  S1-S2 no murmur rub or gallop Respiratory:  wheeze posteriorly bilaterally but able to verbalize well and no visible accessory muscle use abdomen soft nontender No lower extremity edema  Discharge Instructions   Discharge Instructions    Diet - low sodium heart healthy    Complete by:  As directed      Increase activity slowly    Complete by:  As directed           Current Discharge Medication List    START taking these medications   Details  chlorpheniramine-HYDROcodone (TUSSIONEX) 10-8 MG/5ML SUER Take 5 mLs by mouth every 12 (twelve) hours as needed for cough. Qty: 140 mL, Refills: 0    levofloxacin (LEVAQUIN) 500 MG tablet Take 1 tablet (500 mg total) by mouth daily. Qty: 2 tablet, Refills: 0      CONTINUE these medications which have CHANGED   Details  predniSONE (DELTASONE) 20 MG tablet Take 0.5 tablets (10 mg total) by mouth daily. Qty: 15 tablet, Refills: 0      CONTINUE these medications which have  NOT CHANGED   Details  albuterol (PROVENTIL HFA;VENTOLIN HFA) 108 (90 Base) MCG/ACT inhaler Inhale 2 puffs into the lungs every 6 (six) hours as needed for wheezing or shortness of breath. Qty: 1 Inhaler, Refills: 11    !! albuterol (PROVENTIL) (2.5 MG/3ML) 0.083% nebulizer solution Take 3 mLs (2.5 mg total) by nebulization every 4 (four) hours as needed for wheezing or shortness of breath. Qty: 75 mL, Refills: 11    aspirin EC 81 MG tablet Take 81 mg by mouth every morning.     budesonide-formoterol (SYMBICORT) 160-4.5 MCG/ACT inhaler Inhale 2 puffs  into the lungs 2 (two) times daily. Qty: 1 Inhaler, Refills: 11    fluticasone (FLONASE) 50 MCG/ACT nasal spray Place 1 spray into both nostrils daily as needed for allergies or rhinitis.    hydrochlorothiazide (HYDRODIURIL) 12.5 MG tablet Take 1 tablet (12.5 mg total) by mouth as needed. Qty: 30 tablet, Refills: 3    nystatin (MYCOSTATIN) 100000 UNIT/ML suspension Use as directed 5 mLs (500,000 Units total) in the mouth or throat 4 (four) times daily. Qty: 240 mL, Refills: 3    oxyCODONE-acetaminophen (ROXICET) 5-325 MG per tablet Take 1 tablet by mouth every 8 (eight) hours as needed for severe pain. Qty: 30 tablet, Refills: 0    pantoprazole (PROTONIX) 40 MG tablet take 1 tablet by mouth once daily Qty: 90 tablet, Refills: 1   Associated Diagnoses: Gastroesophageal reflux disease, esophagitis presence not specified    promethazine-codeine (PHENERGAN WITH CODEINE) 6.25-10 MG/5ML syrup Take 5 mLs by mouth every 6 (six) hours as needed for cough. Qty: 180 mL, Refills: 0    simvastatin (ZOCOR) 20 MG tablet Take 1 tablet (20 mg total) by mouth at bedtime. Qty: 90 tablet, Refills: 1   Associated Diagnoses: HLD (hyperlipidemia)    Tiotropium Bromide Monohydrate (SPIRIVA RESPIMAT) 2.5 MCG/ACT AERS Inhale 2 sprays into the lungs daily. Qty: 1 Inhaler, Refills: 11   Associated Diagnoses: COPD exacerbation ([place])    !! albuterol (PROVENTIL) (2.5 MG/3ML) 0.083% nebulizer solution Take 3 mLs (2.5 mg total) by nebulization every 6 (six) hours as needed for wheezing or shortness of breath. Qty: 150 mL, Refills: 1    alendronate (FOSAMAX) 70 MG tablet Take 1 tablet (70 mg total) by mouth every 7 (seven) days. Take with a full glass of water on an empty stomach.(wednesday) Qty: 4 tablet, Refills: 6     !! - Potential duplicate medications found. Please discuss with provider.    STOP taking these medications     amoxicillin (AMOXIL) 875 MG tablet      Calcium Carbonate-Vitamin D (CALCIUM  600/VITAMIN D) 600-400 MG-UNIT per tablet      Multiple Vitamin (MULITIVITAMIN WITH MINERALS) TABS        Allergies  Allergen Reactions  . Keflex [Cephalexin] Anaphylaxis and Rash  . Doxycycline Other (See Comments)    Gave patient oral thrush  . Sulfa Antibiotics   . Avelox [Moxifloxacin Hcl In Nacl] Itching and Rash  . Toradol [Ketorolac Tromethamine] Swelling, Rash and Other (See Comments)    Bruising, swelling, rash at injection site      The results of significant diagnostics from this hospitalization (including imaging, microbiology, ancillary and laboratory) are listed below for reference.    Significant Diagnostic Studies: Dg Chest Portable 1 View  02/24/2016  CLINICAL DATA:  Shortness of breath for few days EXAM: PORTABLE CHEST 1 VIEW COMPARISON:  11/07/2013 FINDINGS: The heart size and mediastinal contours are within normal limits. Both lungs are clear.  The visualized skeletal structures are unremarkable. IMPRESSION: No active disease. Electronically Signed   By: Inez Catalina M.D.   On: 02/24/2016 14:58    Microbiology: No results found for this or any previous visit (from the past 240 hour(s)).   Labs: Basic Metabolic Panel:  Recent Labs Lab 02/24/16 1439 02/25/16 0534  NA 143 140  K 4.7 4.4  CL 97* 98*  CO2 38* 34*  GLUCOSE 191* 123*  BUN 15 15  CREATININE 0.81 0.72  CALCIUM 10.0 9.0   Liver Function Tests: No results for input(s): AST, ALT, ALKPHOS, BILITOT, PROT, ALBUMIN in the last 168 hours. No results for input(s): LIPASE, AMYLASE in the last 168 hours. No results for input(s): AMMONIA in the last 168 hours. CBC:  Recent Labs Lab 02/24/16 1448 02/25/16 0534  WBC 14.5* 17.1*  NEUTROABS 12.9*  --   HGB 15.3* 14.1  HCT 48.4* 44.7  MCV 100.4* 98.0  PLT 249 250   Cardiac Enzymes:  Recent Labs Lab 02/24/16 1439  TROPONINI <0.03   BNP: BNP (last 3 results)  Recent Labs  02/24/16 1439  BNP 27.0    ProBNP (last 3 results) No  results for input(s): PROBNP in the last 8760 hours.  CBG: No results for input(s): GLUCAP in the last 168 hours.     SignedNita Sells MD   [facility] 02/27/2016, 11:39 AM

## 2016-02-27 NOTE — Care Management Important Message (Signed)
Important Message  Patient Details  Name: Cindy [name] MRN: [id number] Date of Birth: 1956/10/30   Medicare Important Message Given:  Yes    Sherald Barge, RN 02/27/2016, 1:49 PM

## 2016-02-27 NOTE — Progress Notes (Signed)
Discharge instructions and prescriptions given, verbalized understanding, out in stable condition via w/c with staff. 

## 2016-02-27 NOTE — Care Management Note (Signed)
Case Management Note  Patient Details  Name: ALESSA JAKEL MRN: [id number] Date of Birth: 27-Jul-1956  Expected Discharge Date:     02/27/2016             Expected Discharge Plan:  Home/Self Care  In-House Referral:  NA  Discharge planning Services  CM Consult  Post Acute Care Choice:  NA Choice offered to:  NA  DME Arranged:    DME Agency:     HH Arranged:    [place] Agency:     Status of Service:  Completed, signed off  Medicare Important Message Given:    yes Date Medicare IM Given:    Medicare IM give by:    Date Additional Medicare IM Given:    Additional Medicare Important Message give by:     If discussed at [place] of Stay Meetings, dates discussed:    Additional Comments: Pt discharged home with self care today. No CM needs.   Sherald Barge, RN 02/27/2016, 1:48 PM

## 2016-03-13 ENCOUNTER — Other Ambulatory Visit: Payer: Self-pay | Admitting: *Deleted

## 2016-03-13 MED ORDER — PREDNISONE 10 MG PO TABS: 10.0000 mg | ORAL_TABLET | Freq: Every day | ORAL | Status: DC

## 2016-03-18 ENCOUNTER — Inpatient Hospital Stay: Payer: Medicare Other | Admitting: Family Medicine

## 2016-04-15 ENCOUNTER — Ambulatory Visit (INDEPENDENT_AMBULATORY_CARE_PROVIDER_SITE_OTHER): Payer: Medicare Other | Admitting: Family Medicine

## 2016-04-15 ENCOUNTER — Encounter: Payer: Self-pay | Admitting: Family Medicine

## 2016-04-15 VITALS — BP 124/76 | HR 90 | Temp 98.9°F | Resp 18 | Ht 61.0 in | Wt 167.0 lb

## 2016-04-15 DIAGNOSIS — M7989 Other specified soft tissue disorders: Secondary | ICD-10-CM | POA: Diagnosis not present

## 2016-04-15 DIAGNOSIS — R7302 Impaired glucose tolerance (oral): Secondary | ICD-10-CM

## 2016-04-15 DIAGNOSIS — M810 Age-related osteoporosis without current pathological fracture: Secondary | ICD-10-CM | POA: Diagnosis not present

## 2016-04-15 DIAGNOSIS — E785 Hyperlipidemia, unspecified: Secondary | ICD-10-CM | POA: Diagnosis not present

## 2016-04-15 DIAGNOSIS — Z720 Tobacco use: Secondary | ICD-10-CM

## 2016-04-15 DIAGNOSIS — J411 Mucopurulent chronic bronchitis: Secondary | ICD-10-CM

## 2016-04-15 DIAGNOSIS — R0600 Dyspnea, unspecified: Secondary | ICD-10-CM

## 2016-04-15 DIAGNOSIS — R0609 Other forms of dyspnea: Secondary | ICD-10-CM | POA: Diagnosis not present

## 2016-04-15 LAB — CBC WITH DIFFERENTIAL/PLATELET
Basophils Absolute: 0 cells/uL (ref 0–200)
Basophils Relative: 0 %
EOS PCT: 1 %
Eosinophils Absolute: 123 cells/uL (ref 15–500)
HEMATOCRIT: 44.6 % (ref 35.0–45.0)
HEMOGLOBIN: 14 g/dL (ref 12.0–15.0)
LYMPHS ABS: 3567 {cells}/uL (ref 850–3900)
Lymphocytes Relative: 29 %
MCH: 30.4 pg (ref 27.0–33.0)
MCHC: 31.4 g/dL — AB (ref 32.0–36.0)
MCV: 97 fL (ref 80.0–100.0)
MONOS PCT: 8 %
MPV: 11.8 fL (ref 7.5–12.5)
Monocytes Absolute: 984 cells/uL — ABNORMAL HIGH (ref 200–950)
NEUTROS ABS: 7626 {cells}/uL (ref 1500–7800)
Neutrophils Relative %: 62 %
PLATELETS: 252 10*3/uL (ref 140–400)
RBC: 4.6 MIL/uL (ref 3.80–5.10)
RDW: 13.7 % (ref 11.0–15.0)
WBC: 12.3 10*3/uL — AB (ref 3.8–10.8)

## 2016-04-15 LAB — COMPREHENSIVE METABOLIC PANEL
ALT: 20 U/L (ref 6–29)
AST: 19 U/L (ref 10–35)
Albumin: 4.1 g/dL (ref 3.6–5.1)
Alkaline Phosphatase: 51 U/L (ref 33–130)
BUN: 7 mg/dL (ref 7–25)
CALCIUM: 9.6 mg/dL (ref 8.6–10.4)
CHLORIDE: 102 mmol/L (ref 98–110)
CO2: 32 mmol/L — AB (ref 20–31)
Creat: 0.79 mg/dL (ref 0.50–0.99)
GLUCOSE: 80 mg/dL (ref 70–99)
POTASSIUM: 3.8 mmol/L (ref 3.5–5.3)
Sodium: 142 mmol/L (ref 135–146)
Total Bilirubin: 0.4 mg/dL (ref 0.2–1.2)
Total Protein: 6.3 g/dL (ref 6.1–8.1)

## 2016-04-15 LAB — LIPID PANEL
CHOL/HDL RATIO: 3.1 ratio (ref <=5.0)
Cholesterol: 198 mg/dL (ref 125–200)
HDL: 63 mg/dL (ref >=46)
LDL CALC: 103 mg/dL (ref <130)
Triglycerides: 160 mg/dL — ABNORMAL HIGH (ref <150)
VLDL: 32 mg/dL — AB (ref <30)

## 2016-04-15 LAB — HEMOGLOBIN A1C
HEMOGLOBIN A1C: 6.2 % — AB (ref <5.7)
Mean Plasma Glucose: 131 mg/dL

## 2016-04-15 MED ORDER — FUROSEMIDE 20 MG PO TABS: 20.0000 mg | ORAL_TABLET | Freq: Every day | ORAL | Status: DC

## 2016-04-15 MED ORDER — ALBUTEROL SULFATE HFA 108 (90 BASE) MCG/ACT IN AERS: 2.0000 | INHALATION_SPRAY | Freq: Four times a day (QID) | RESPIRATORY_TRACT | Status: DC | PRN

## 2016-04-15 MED ORDER — OXYCODONE-ACETAMINOPHEN 5-325 MG PO TABS: 1.0000 | ORAL_TABLET | Freq: Three times a day (TID) | ORAL | Status: DC | PRN

## 2016-04-15 NOTE — Progress Notes (Signed)
Patient ID: Cindy [name], female   DOB: 11-Jun-1956, 60 y.o.   MRN: [id number]   Subjective:    Patient ID: Cindy [name], female    DOB: [birth year], 60 y.o.   MRN: [id number]  Patient presents for 2 month F/U and Edema Patient for follow-up. After last visit she had to be admitted for COPD exacerbation that was not improved. She also had some elevation in her blood pressure heart rate but that resolved after her breathing was controlled. She was not intubated. She unfortunately continues to smoke. She still wearing her oxygen pretty much daily. She states that she feels does not feel like she is at her baseline. She wanted try changing her inhaler since stopping her friends were 1. She is currently on Spiriva, Symbicort, albuterol, prednisone 10 mg daily. Oxygen therapy  She's also noticed more leg swelling the past few weeks. I put her on hydrochlorothiazide 12.5 mg back in February but this did not help. She feels like her swelling is worse.  She requests a refill on her pain medication she uses very sparingly Review Of Systems:  GEN- denies fatigue, fever, weight loss,weakness, recent illness HEENT- denies eye drainage, change in vision, nasal discharge, CVS- denies chest pain, palpitations RESP- +SOB, +cough, +wheeze ABD- denies N/V, change in stools, abd pain GU- denies dysuria, hematuria, dribbling, incontinence MSK- + joint pain, muscle aches, injury Neuro- denies headache, dizziness, syncope, seizure activity       Objective:    BP 124/76 mmHg  Pulse 90  Temp(Src) 98.9 F (37.2 C) (Oral)  Resp 18  Ht 5\' 1"  (1.549 m)  Wt 167 lb (75.751 kg)  BMI 31.57 kg/m2 GEN- NAD, alert and oriented x3 HEENT- PERRL, EOMI, non injected sclera, pink conjunctiva, MMM, oropharynx clear Neck- Supple, no thyromegaly CVS- RRR, no murmur RESP-bilat wheeze, normal WOB at rest, no rales , no retractions  ABD-NABS,soft,NT,ND EXT- 1+ pitting  Edema to mid shins Pulses- Radial, DP- 2+         Assessment & Plan:      Problem List Items Addressed This Visit    Tobacco abuse (Chronic)   Osteoporosis   Leg swelling   Relevant Orders   CBC with Differential/Platelet   Comprehensive metabolic panel   Echocardiogram   Hyperlipidemia   Relevant Medications   furosemide (LASIX) 20 MG tablet   Other Relevant Orders   Lipid panel   COPD (chronic obstructive pulmonary disease) ([place]) - Primary    She has severe end-stage COPD however she continues to smoke. She continues to decline every day. She was recently hospitalized again for exacerbation that would not improve. She has follow-up with her lung doctor but does not want to do any further pulmonary function tests. She states her breathing is too bad. She also asked about 7 halo changes I deferred this to her pulmonologist. She seems to be on max therapy already.  She really needs to quit smoking but does not seem to have any interest in this. I discussed with her that she is going to end her life early if she does not quit smoking fibrillation improve her breathing.      Relevant Medications   albuterol (PROVENTIL HFA;VENTOLIN HFA) 108 (90 Base) MCG/ACT inhaler    Other Visit Diagnoses    DOE (dyspnea on exertion)        MTF with end stage COPD, now with swelling ? steroids related on possible CHF new onset, obtain ECHO, start lasix    Relevant  Orders    Echocardiogram    Glucose intolerance (impaired glucose tolerance)        recheck A1C on chronic steroids    Relevant Orders    Hemoglobin A1c       Note: This dictation was prepared with Dragon dictation along with smaller phrase technology. Any transcriptional errors that result from this process are unintentional.

## 2016-04-15 NOTE — Patient Instructions (Signed)
Take lasix once a day for swelling Ultrasound of heart to be done Elevate your feet  Schedule with your lung doctor

## 2016-04-15 NOTE — Assessment & Plan Note (Signed)
She has severe end-stage COPD however she continues to smoke. She continues to decline every day. She was recently hospitalized again for exacerbation that would not improve. She has follow-up with her lung doctor but does not want to do any further pulmonary function tests. She states her breathing is too bad. She also asked about 7 halo changes I deferred this to her pulmonologist. She seems to be on max therapy already.  She really needs to quit smoking but does not seem to have any interest in this. I discussed with her that she is going to end her life early if she does not quit smoking fibrillation improve her breathing.

## 2016-05-01 ENCOUNTER — Ambulatory Visit (INDEPENDENT_AMBULATORY_CARE_PROVIDER_SITE_OTHER): Payer: Medicare Other | Admitting: Emergency Medicine

## 2016-05-01 ENCOUNTER — Encounter: Payer: Self-pay | Admitting: Emergency Medicine

## 2016-05-01 VITALS — BP 112/90 | HR 97 | Ht 61.0 in | Wt 166.0 lb

## 2016-05-01 DIAGNOSIS — J411 Mucopurulent chronic bronchitis: Secondary | ICD-10-CM | POA: Diagnosis not present

## 2016-05-01 DIAGNOSIS — I272 Other secondary pulmonary hypertension: Secondary | ICD-10-CM

## 2016-05-01 DIAGNOSIS — Z720 Tobacco use: Secondary | ICD-10-CM | POA: Diagnosis not present

## 2016-05-01 DIAGNOSIS — IMO0002 Reserved for concepts with insufficient information to code with codable children: Secondary | ICD-10-CM

## 2016-05-01 NOTE — Assessment & Plan Note (Signed)
Severe disease with daily symptoms and recurrent exacerbations. Gold C-D disease. Her last FEV1 was 0.65 L and not sure there is any benefit to repeating at this time. Smoking cessation would be key, she is not ready to do this.   Please continue your spiriva and symbicort as you are taking them  Continue your prednisone 10mg  every day Please work hard on cutting down your smoking.  Continue your oxygen at 2L/min. You need to wear this with ALL exertion.  Please follow with our NP in 2 months.  Follow with Dr Lamonte Sakai in 4 months or sooner if you have any problems.

## 2016-05-01 NOTE — Progress Notes (Signed)
Subjective:    Patient ID: Cindy [name], female    DOB: [birth year], 60 y.o.   MRN: [id number]  HPI 60 yo woman, smoker (40pk-yrs), hyperlipidemia. Followed by Dr Buelah Manis and on Spiriva + Symbicort + ProAir prn, FEV1 19% predicted on prior PFT! She has had several exacerbations over the last year. Was first noted to be hypoxemic in 2010, is on O2 that she is using prn.  She has exertional wheeze, SOB. Coughs daily - productive white, thick.    ROV 09/04/15 -- follow-up visit for tobacco use and severe COPD. I have had her on chronic prednisone, currently 10 mg daily. She is also using Spiriva and Symbicort.  She is having significant exertional SOB with any activity. She has been using her O2 with exertion. She is having daily cough, brings up thick mucous, no purulence. No change in her cigarettes - 1pk/day. Has not required any extra pred or abx since our last visit.   ROV 01/25/16 -- follow-up visit of tobacco use and severe COPD. She has been managed on chronic prednisone as well as Spiriva and Symbicort. She reports that she has been doing well until she had an AE in 10/16 and then again in 12/16. She required steroids, augmentin. She is currently using albuterol about once a day. She is now back to her usual 10mg  pred daily.   ROV 05/01/16 -- follow-up visit for history of COPD that is oxygen and prednisone dependent, 10mg . She continues to smoke. She is currently on Spiriva and Symbicort. Her most recent exacerbation was in late February - was hospitalized and treated w steroids, abx. Did not require BiPAP. She has been having increased LE edema over the last few months, a TTE was ordered but has not been scheduled. She was started on lasix last month. Her exercise tolerance is poor. Wearing 2L/min but not at all times.    PULMONARY FUNCTON TEST 09/27/2012  FVC 2.31  FEV1 .87  FEV1/FVC 37.7  FVC  % Predicted 76  FEV % Predicted 38  FeF 25-75 .3  FeF 25-75 % Predicted 2.65       Objective:    Physical Exam Filed Vitals:   05/01/16 0956 05/01/16 0957  BP:  112/90  Pulse:  97  Height: 5\' 1"  (1.549 m)   Weight: 166 lb (75.297 kg)   SpO2:  95%   Gen: Pleasant, well-nourished, in no distress,   ENT: No lesions,  mouth clear,  oropharynx clear, no postnasal drip  Neck: No JVD, no TMG, no carotid bruits, no stridor  Lungs: No use of accessory muscles, B soft exp wheezes L > R  Cardiovascular: RRR, heart sounds normal, no murmur or gallops, no peripheral edema  Musculoskeletal: No deformities, no cyanosis or clubbing  Neuro: alert, non focal  Skin: Warm, no lesions or rashes     Assessment & Plan:  Tobacco abuse Discussed cessation with her today. She is not making any progress. I asked her to set a goal of cutting down to three fourths of a pack daily.  COPD (chronic obstructive pulmonary disease) Severe disease with daily symptoms and recurrent exacerbations. Gold C-D disease. Her last FEV1 was 0.65 L and not sure there is any benefit to repeating at this time. Smoking cessation would be key, she is not ready to do this.   Please continue your spiriva and symbicort as you are taking them  Continue your prednisone 10mg  every day Please work hard on cutting down your smoking.  Continue  your oxygen at 2L/min. You need to wear this with ALL exertion.  Please follow with our NP in 2 months.  Follow with Dr Lamonte Sakai in 4 months or sooner if you have any problems.  Secondary pulmonary hypertension ([place]) Almost certainly due to chronic hypoxemia and chronic lung disease. I agree with obtaining an echocardiogram. This is been ordered but not yet scheduled. We will help her get scheduled today. The main intervention that we need to make is to treat her underlying process more effectively including keeping her adequately oxygenated

## 2016-05-01 NOTE — Patient Instructions (Addendum)
We will insure that your echocardiogram is scheduled.  Please continue your spiriva and symbicort as you are taking them  Continue your prednisone 10mg  every day Please work hard on cutting down your smoking.  Continue your oxygen at 2L/min. You need to wear this with ALL exertion.  Please follow with our NP in 2 months.  Follow with Dr Lamonte Sakai in 4 months or sooner if you have any problems.

## 2016-05-01 NOTE — Assessment & Plan Note (Signed)
Almost certainly due to chronic hypoxemia and chronic lung disease. I agree with obtaining an echocardiogram. This is been ordered but not yet scheduled. We will help her get scheduled today. The main intervention that we need to make is to treat her underlying process more effectively including keeping her adequately oxygenated

## 2016-05-01 NOTE — Assessment & Plan Note (Signed)
Discussed cessation with her today. She is not making any progress. I asked her to set a goal of cutting down to three fourths of a pack daily.

## 2016-05-05 ENCOUNTER — Telehealth: Payer: Self-pay | Admitting: Emergency Medicine

## 2016-05-05 DIAGNOSIS — M7989 Other specified soft tissue disorders: Secondary | ICD-10-CM

## 2016-05-05 DIAGNOSIS — R0609 Other forms of dyspnea: Secondary | ICD-10-CM

## 2016-05-05 NOTE — Telephone Encounter (Signed)
Patient states that her PCP put her on Lasix because she has been swelling.  She said that she was going to order an Echo.  She said that Dr. Lamonte Sakai advised her that he would follow up on the Echo.  Echo was ordered by Dr. Buelah Manis on 04/15/16 but nothing has been done since that date, patient has not heard from anyone at the hospital or at Dr. Dorian Heckle office about appointment.  Patient stated that she would rather our office put the order in because she knows that we will follow up on it.  Spoke with Ria Comment and she said it would be okay to go ahead and put in the order.   Order entered for ECHO.  Patient aware that Echo has been ordered.  Will send as urgent order since patient has not had Echo done yet and it was ordered on 04/15/16.  Needs to be done ASAP.  Will send message to [facility] for follow up.

## 2016-05-07 ENCOUNTER — Ambulatory Visit (HOSPITAL_COMMUNITY)
Admission: RE | Admit: 2016-05-07 | Discharge: 2016-05-07 | Disposition: A | Discharge status: Home / Self Care | Payer: Medicare Other | Source: Ambulatory Visit | Attending: Emergency Medicine | Admitting: Emergency Medicine

## 2016-05-07 DIAGNOSIS — E785 Hyperlipidemia, unspecified: Secondary | ICD-10-CM | POA: Insufficient documentation

## 2016-05-07 DIAGNOSIS — I358 Other nonrheumatic aortic valve disorders: Secondary | ICD-10-CM | POA: Insufficient documentation

## 2016-05-07 DIAGNOSIS — I059 Rheumatic mitral valve disease, unspecified: Secondary | ICD-10-CM | POA: Insufficient documentation

## 2016-05-07 DIAGNOSIS — R0609 Other forms of dyspnea: Secondary | ICD-10-CM

## 2016-05-07 DIAGNOSIS — R0600 Dyspnea, unspecified: Secondary | ICD-10-CM | POA: Diagnosis present

## 2016-05-07 DIAGNOSIS — K219 Gastro-esophageal reflux disease without esophagitis: Secondary | ICD-10-CM | POA: Diagnosis not present

## 2016-05-07 DIAGNOSIS — M7989 Other specified soft tissue disorders: Secondary | ICD-10-CM | POA: Insufficient documentation

## 2016-05-07 DIAGNOSIS — Z720 Tobacco use: Secondary | ICD-10-CM | POA: Diagnosis not present

## 2016-05-15 ENCOUNTER — Telehealth: Payer: Self-pay | Admitting: Emergency Medicine

## 2016-05-15 NOTE — Telephone Encounter (Signed)
Called, spoke with pt: 1.  Pt requesting echo results - RB, please advise.  Thank you. 2.  Pt states BLE edema has worsened since OV with RB.  Reports edema is from knees down and R > L.  Pt states she "can feel the swelling" when walking.  Staates she is taking lasix 20 mg qd "3/4 of the time."  States she doesn't feel it helps when she takes it so she doesn't take it everyday.  Pt states she picked up lasix rx on 4/18 and has 12 tablets out of 30 left.  Pt states she is on her feet "most of the day" depending on the day but does try to elevate legs some throughout the day.  With increased edema, pt has noticed increased DOE and needing to use o2 more frequently.  Requesting further recs - RB, please advise.  Thank you.

## 2016-05-15 NOTE — Telephone Encounter (Signed)
Please let her know that her echocardiogram does not show evidence for significant pulmonary hypertension. The right side of her heart is functioning normally. I want her to wear her oxygen with all exertion. Have her continue her diuretics the way she is taking them through the weekend. She needs an office visit next week to reevaluate.

## 2016-05-15 NOTE — Telephone Encounter (Signed)
LMTCB

## 2016-05-16 NOTE — Telephone Encounter (Signed)
Patient called returning our call -prm  °

## 2016-05-16 NOTE — Telephone Encounter (Signed)
Spoke with the pt and notified of recs per MW  She verbalized understanding and denied any questions 

## 2016-05-16 NOTE — Telephone Encounter (Signed)
As long as breathing is baseline would not attempt to manage this over the phone, keep aptt but move up if worse swelling or breathing

## 2016-05-16 NOTE — Telephone Encounter (Signed)
Results have been explained to patient, pt expressed understanding. Pt states that when she stays off her feet the swelling lessens some but as soon as she gets up and starts moving the swelling returns. Pt states that when she walks she feels numbness in her toes from the swelling. Pt states that she has taken more than half of her Lasix and has 11 pills left and does not seem to be doing any good. Pt only taking Lasix 20mg .  Pt using her O2 as directed with exertion. Pt states that she has some HCTZ 12.5mg  at home that she is not taking them. Refused to schedule appt for next week - states that she does not feel she needs to be seen - has an appt scheduled within the next Month with RB. Please advise Dr Melvyn Novas as Dr Lamonte Sakai is not available today. Thanks.

## 2016-05-16 NOTE — Telephone Encounter (Signed)
lmtcb x1 for pt. 

## 2016-05-20 ENCOUNTER — Other Ambulatory Visit: Payer: Self-pay | Admitting: Family Medicine

## 2016-05-20 DIAGNOSIS — J441 Chronic obstructive pulmonary disease with (acute) exacerbation: Secondary | ICD-10-CM

## 2016-05-20 MED ORDER — BUDESONIDE-FORMOTEROL FUMARATE 160-4.5 MCG/ACT IN AERO: 2.0000 | INHALATION_SPRAY | Freq: Two times a day (BID) | RESPIRATORY_TRACT | Status: DC

## 2016-05-20 MED ORDER — ALENDRONATE SODIUM 70 MG PO TABS: 70.0000 mg | ORAL_TABLET | ORAL | Status: DC

## 2016-05-20 NOTE — Telephone Encounter (Signed)
Medication called/sent to requested pharmacy  

## 2016-06-11 ENCOUNTER — Ambulatory Visit (HOSPITAL_COMMUNITY)
Admission: RE | Admit: 2016-06-11 | Discharge: 2016-06-11 | Disposition: A | Discharge status: Home / Self Care | Payer: Medicare Other | Source: Ambulatory Visit | Attending: Urology | Admitting: Urology

## 2016-06-11 ENCOUNTER — Other Ambulatory Visit: Payer: Self-pay | Admitting: Urology

## 2016-06-11 DIAGNOSIS — N200 Calculus of kidney: Secondary | ICD-10-CM

## 2016-06-11 DIAGNOSIS — I700 Atherosclerosis of aorta: Secondary | ICD-10-CM | POA: Insufficient documentation

## 2016-06-16 ENCOUNTER — Ambulatory Visit (INDEPENDENT_AMBULATORY_CARE_PROVIDER_SITE_OTHER): Payer: Medicare Other | Admitting: Family Medicine

## 2016-06-16 ENCOUNTER — Encounter: Payer: Self-pay | Admitting: Family Medicine

## 2016-06-16 VITALS — BP 138/88 | HR 118 | Temp 97.9°F | Resp 24 | Ht 61.0 in | Wt 172.0 lb

## 2016-06-16 DIAGNOSIS — J441 Chronic obstructive pulmonary disease with (acute) exacerbation: Secondary | ICD-10-CM

## 2016-06-16 MED ORDER — IPRATROPIUM-ALBUTEROL 0.5-2.5 (3) MG/3ML IN SOLN
3.0000 mL | Freq: Once | RESPIRATORY_TRACT | Status: AC
  Administered 2016-06-16: 3 mL via RESPIRATORY_TRACT

## 2016-06-16 MED ORDER — GUAIFENESIN-CODEINE 100-10 MG/5ML PO SOLN: 5.0000 mL | Freq: Four times a day (QID) | ORAL | Status: DC | PRN

## 2016-06-16 MED ORDER — FUROSEMIDE 20 MG PO TABS: 20.0000 mg | ORAL_TABLET | Freq: Every day | ORAL | Status: DC

## 2016-06-16 MED ORDER — HYDROCOD POLST-CPM POLST ER 10-8 MG/5ML PO SUER: 5.0000 mL | Freq: Two times a day (BID) | ORAL | Status: DC | PRN

## 2016-06-16 MED ORDER — METHYLPREDNISOLONE ACETATE PF 80 MG/ML IJ SUSP
80.0000 mg | Freq: Once | INTRAMUSCULAR | Status: AC
  Administered 2016-06-16: 80 mg via INTRAMUSCULAR

## 2016-06-16 MED ORDER — AMOXICILLIN-POT CLAVULANATE 875-125 MG PO TABS: 1.0000 | ORAL_TABLET | Freq: Two times a day (BID) | ORAL | Status: DC

## 2016-06-16 NOTE — Progress Notes (Addendum)
Patient ID: Cindy [name], female   DOB: 03-15-1956, 60 y.o.   MRN: [id number]    Subjective:    Patient ID: Cindy [name], female    DOB: [birth year], 60 y.o.   MRN: [id number]  Patient presents for COPD Exacerbation Pt  here with COPD exacerbation. A week ago she started with her typical cough congestion wheezing chest tightness. She did not give herself her breathing treatments as well as morning. She's not had any fever. She is on maximum therapy for her COPD including daily prednisone followed by pulmonary as well. She does continue to smoke.  Note since her last visit she did have echocardiogram done 25 UA for pulmonary hypertension there was no evidence of this. She is on diuretics she was given Lasix 20 mg daily back in April  Review Of Systems:  GEN- denies fatigue, fever, weight loss,weakness, recent illness HEENT- denies eye drainage, change in vision, nasal discharge, CVS- denies chest pain, palpitations RESP- + SOB, +cough,+ wheeze Neuro- denies headache, dizziness, syncope, seizure activity       Objective:    BP 138/88 mmHg  Pulse 118  Temp(Src) 97.9 F (36.6 C) (Oral)  Resp 24  Ht 5\' 1"  (1.549 m)  Wt 172 lb (78.019 kg)  BMI 32.52 kg/m2  SpO2 93% GEN- NAD, alert and oriented x3,weight up 5lbs HEENT- PERRL, EOMI, non injected sclera, pink conjunctiva, MMM, oropharynx clear Neck- Supple, no LAD, +retractions  CVS- RRR, no murmur RESP- bilat bronchospasm, decreased air movement, audible wheeze and grunting at times. Sat 90% on 2L, rhonchi Ext- non pitting edema   S/p Duoneb - Depo Medol 80mg  , improved air movement, but still with bronchospasm, she does not want to go to ER for treatment oxygen sat 93% 2L      Assessment & Plan:      Problem List Items Addressed This Visit    None    Visit Diagnoses    COPD exacerbation ([place])    -  Primary    recurrent COPD exacerbation, I dont think she is going to do well at home, advised ER if not improved and how  important to seek care. Given Depo Medrol and Prednisone taper augmentin , advised to keep oxygen on, use albuterol every 4 hours, she states she has someone staying with her,   Continue lasix for swelling    Relevant Medications    methylPREDNISolone acetate PF (DEPO-MEDROL) injection 80 mg (Completed)    ipratropium-albuterol (DUONEB) 0.5-2.5 (3) MG/3ML nebulizer solution 3 mL (Completed)    guaiFENesin-codeine 100-10 MG/5ML syrup       Note: This dictation was prepared with Dragon dictation along with smaller phrase technology. Any transcriptional errors that result from this process are unintentional.

## 2016-06-16 NOTE — Addendum Note (Signed)
Addended by: Vic Blackbird F on: 06/16/2016 03:59 PM   Modules accepted: Orders

## 2016-06-16 NOTE — Patient Instructions (Signed)
Take lasix every day  Take prednisone 60mg  x 3 days, then 40mg  x 3 days, then 20mg  x 3 days, then go back to 10mg  once a day  Take augmentin Cough medicine refilled GO TO ER if your breathing gets worse  F/U as needed

## 2016-06-17 ENCOUNTER — Ambulatory Visit (INDEPENDENT_AMBULATORY_CARE_PROVIDER_SITE_OTHER): Payer: Medicare Other | Admitting: Urology

## 2016-06-17 DIAGNOSIS — N200 Calculus of kidney: Secondary | ICD-10-CM | POA: Diagnosis not present

## 2016-07-02 ENCOUNTER — Ambulatory Visit: Payer: Medicare Other | Admitting: Acute Care

## 2016-07-15 ENCOUNTER — Telehealth: Payer: Self-pay | Admitting: Family Medicine

## 2016-07-15 DIAGNOSIS — E785 Hyperlipidemia, unspecified: Secondary | ICD-10-CM

## 2016-07-15 MED ORDER — SIMVASTATIN 20 MG PO TABS: 20.0000 mg | ORAL_TABLET | Freq: Every day | ORAL | Status: DC

## 2016-07-15 NOTE — Telephone Encounter (Signed)
Pt is requesting a refill of Simvastatin to be sent to [facility] in [place].

## 2016-07-15 NOTE — Telephone Encounter (Signed)
Refill appropriate and filled per protocol. 

## 2016-07-25 ENCOUNTER — Other Ambulatory Visit: Payer: Self-pay | Admitting: *Deleted

## 2016-07-25 DIAGNOSIS — E785 Hyperlipidemia, unspecified: Secondary | ICD-10-CM

## 2016-07-25 MED ORDER — SIMVASTATIN 20 MG PO TABS: 20.0000 mg | ORAL_TABLET | Freq: Every day | ORAL | 1 refills | Status: DC

## 2016-07-25 NOTE — Telephone Encounter (Signed)
Received fax requesting refill on Zocor.   Refill appropriate and filled per protocol. 

## 2016-08-04 ENCOUNTER — Other Ambulatory Visit: Payer: Self-pay | Admitting: Family Medicine

## 2016-08-04 DIAGNOSIS — J441 Chronic obstructive pulmonary disease with (acute) exacerbation: Secondary | ICD-10-CM

## 2016-08-22 ENCOUNTER — Encounter: Payer: Self-pay | Admitting: Family Medicine

## 2016-08-22 ENCOUNTER — Ambulatory Visit (INDEPENDENT_AMBULATORY_CARE_PROVIDER_SITE_OTHER): Payer: Medicare Other | Admitting: Family Medicine

## 2016-08-22 VITALS — BP 136/88 | HR 112 | Temp 98.2°F | Resp 22 | Wt 176.0 lb

## 2016-08-22 DIAGNOSIS — J441 Chronic obstructive pulmonary disease with (acute) exacerbation: Secondary | ICD-10-CM

## 2016-08-22 MED ORDER — METHYLPREDNISOLONE ACETATE 80 MG/ML IJ SUSP
80.0000 mg | Freq: Once | INTRAMUSCULAR | Status: AC
  Administered 2016-08-22: 80 mg via INTRAMUSCULAR

## 2016-08-22 MED ORDER — AMOXICILLIN-POT CLAVULANATE 875-125 MG PO TABS: 1.0000 | ORAL_TABLET | Freq: Two times a day (BID) | ORAL | 0 refills | Status: DC

## 2016-08-22 MED ORDER — PREDNISONE 20 MG PO TABS: ORAL_TABLET | ORAL | 0 refills | Status: DC

## 2016-08-22 NOTE — Progress Notes (Signed)
Subjective:    Patient ID: Cindy [name], female    DOB: 22-Oct-1956, 60 y.o.   MRN: [id number]  HPI Patient presents with worsening shortness of breath, worsening dyspnea on exertion, worsening wheezing. Today on examination she has accessory muscle use, increased respiratory rate, and audible wheezing in all 4 lung fields. She has a history of severe COPD on chronic oxygen. Patient was given 80 mg of Depo-Medrol at 3:30. She was also given a duoneb at 3:35.   Past Medical History:  Diagnosis Date  . 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 Arthritis   . Chronic respiratory failure ([place]) On home 02 2-3L  . Compression fracture of lumbar vertebra ([place])   . COPD (chronic obstructive pulmonary disease) ([place])   . Hyperglycemia, drug-induced   . Hyperlipidemia   . Kidney stones 2014   Left side, multiple  . Osteoporosis 2013  . Shortness of breath   . Thyroid disease    pt denies  . Tobacco abuse    Past Surgical History:  Procedure Laterality Date  . CERVICAL BIOPSY    . COLONOSCOPY N/A 03/31/2013   Procedure: COLONOSCOPY;  Surgeon: Rogene Houston, MD;  [facility];  Service: Endoscopy;  Laterality: N/A;  730  . TUBAL LIGATION     Current Outpatient Prescriptions on File Prior to Visit  Medication Sig Dispense Refill  . albuterol (PROVENTIL HFA;VENTOLIN HFA) 108 (90 Base) MCG/ACT inhaler Inhale 2 puffs into the lungs every 6 (six) hours as needed for wheezing or shortness of breath. 1 Inhaler 11  . albuterol (PROVENTIL) (2.5 MG/3ML) 0.083% nebulizer solution Take 3 mLs (2.5 mg total) by nebulization every 4 (four) hours as needed for wheezing or shortness of breath. 75 mL 11  . alendronate (FOSAMAX) 70 MG tablet Take 1 tablet (70 mg total) by mouth every 7 (seven) days. Take with a full glass of water on an empty stomach.(wednesday) 4 tablet 6  . aspirin EC 81 MG tablet Take 81 mg by mouth every morning.     . budesonide-formoterol (SYMBICORT) 160-4.5 MCG/ACT inhaler Inhale 2  puffs into the lungs 2 (two) times daily. 1 Inhaler 11  . fluticasone (FLONASE) 50 MCG/ACT nasal spray Place 1 spray into both nostrils daily as needed for allergies or rhinitis.    . furosemide (LASIX) 20 MG tablet Take 1 tablet (20 mg total) by mouth daily. 30 tablet 3  . guaiFENesin-codeine 100-10 MG/5ML syrup Take 5 mLs by mouth every 6 (six) hours as needed for cough. 180 mL 0  . nystatin (MYCOSTATIN) 100000 UNIT/ML suspension Use as directed 5 mLs (500,000 Units total) in the mouth or throat 4 (four) times daily. 240 mL 3  . oxyCODONE-acetaminophen (ROXICET) 5-325 MG tablet Take 1 tablet by mouth every 8 (eight) hours as needed for severe pain. 30 tablet 0  . pantoprazole (PROTONIX) 40 MG tablet take 1 tablet by mouth once daily 90 tablet 1  . predniSONE (DELTASONE) 10 MG tablet Take 1 tablet (10 mg total) by mouth daily with breakfast. 30 tablet 5  . simvastatin (ZOCOR) 20 MG tablet Take 1 tablet (20 mg total) by mouth at bedtime. 90 tablet 1  . SPIRIVA RESPIMAT 2.5 MCG/ACT AERS INHALE 2 SPRAYS INTO LUNGS DAILY. 4 Inhaler 4   No current facility-administered medications on file prior to visit.    Allergies  Allergen Reactions  . Keflex [Cephalexin] Anaphylaxis and Rash  . Doxycycline Other (See Comments)    Gave patient oral thrush  .  Sulfa Antibiotics   . Avelox [Moxifloxacin Hcl In Nacl] Itching and Rash  . Toradol [Ketorolac Tromethamine] Swelling, Rash and Other (See Comments)    Bruising, swelling, rash at injection site   Social History   Social History  . Marital status: Divorced    Spouse name: N/A  . Number of children: 3  . Years of education: N/A   Occupational History  . disabled Disabled   Social History Main Topics  . Smoking status: Current Every Day Smoker    Packs/day: 1.00    Years: 25.00    Types: Cigarettes  . Smokeless tobacco: Never Used  . Alcohol use No  . Drug use: No  . Sexual activity: Not Currently    Birth control/ protection: Surgical    Other Topics Concern  . Not on file   Social History Narrative  . No narrative on file      Review of Systems  All other systems reviewed and are negative.      Objective:   Physical Exam  Constitutional: She appears well-developed and well-nourished.  Neck: Neck supple.  Cardiovascular: Normal rate, regular rhythm and normal heart sounds.   Pulmonary/Chest: No respiratory distress. She has wheezes. She has no rales. She exhibits no tenderness.  Abdominal: Soft. Bowel sounds are normal.  Musculoskeletal: She exhibits edema.  Lymphadenopathy:    She has no cervical adenopathy.  Vitals reviewed.         Assessment & Plan:  COPD exacerbation ([place])  Patient was given 80 mg of Depo-Medrol 1. She was also given a DuoNeb area patient will be discharged on a prolonged steroid taper. Begin prednisone 60 mg a day for 3 days then 40 mg a day for 3 days and 20 mg a day for 3 days. Recheck here on Monday. Start Augmentin 875 mg by mouth twice a day for 10 days. Use albuterol 2 puffs inhaled every 6 hours as needed for wheezing.  By 4:00, the patient's breathing had improved. Her wheezing was back to her baseline. She no longer had an increased respiratory rate or increased work of breathing. She states that she's feeling better. Plan to go home on prednisone taper pack in addition to Augmentin with as needed nebulizers and follow-up on Monday or sooner if worse

## 2016-08-22 NOTE — Addendum Note (Signed)
Addended by: Shary Decamp B on: 08/22/2016 03:57 PM   Modules accepted: Orders

## 2016-08-25 ENCOUNTER — Ambulatory Visit (INDEPENDENT_AMBULATORY_CARE_PROVIDER_SITE_OTHER): Payer: Medicare Other | Admitting: Family Medicine

## 2016-08-25 ENCOUNTER — Encounter: Payer: Self-pay | Admitting: Family Medicine

## 2016-08-25 VITALS — BP 132/78 | HR 92 | Temp 98.0°F | Resp 24 | Ht 61.0 in | Wt 176.0 lb

## 2016-08-25 DIAGNOSIS — J441 Chronic obstructive pulmonary disease with (acute) exacerbation: Secondary | ICD-10-CM

## 2016-08-25 NOTE — Progress Notes (Signed)
Subjective:    Patient ID: Cindy [name], female    DOB: 17-Feb-1956, 60 y.o.   MRN: [id number]  HPI  08/22/16 Patient presents with worsening shortness of breath, worsening dyspnea on exertion, worsening wheezing. Today on examination she has accessory muscle use, increased respiratory rate, and audible wheezing in all 4 lung fields. She has a history of severe COPD on chronic oxygen. Patient was given 80 mg of Depo-Medrol at 3:30. She was also given a duoneb at 3:35.  At that time, my plan was: Patient was given 80 mg of Depo-Medrol 1. She was also given a DuoNeb area patient will be discharged on a prolonged steroid taper. Begin prednisone 60 mg a day for 3 days then 40 mg a day for 3 days and 20 mg a day for 3 days. Recheck here on Monday. Start Augmentin 875 mg by mouth twice a day for 10 days. Use albuterol 2 puffs inhaled every 6 hours as needed for wheezing.  By 4:00, the patient's breathing had improved. Her wheezing was back to her baseline. She no longer had an increased respiratory rate or increased work of breathing. She states that she's feeling better. Plan to go home on prednisone taper pack in addition to Augmentin with as needed nebulizers and follow-up on Monday or sooner if worse  08/25/16 Her breathing is better.  However today on exam she is still audibly wheezing with diminished breath sounds. There is no increased work of breathing. There is no respiratory distress. She continues to smoke. She is not using albuterol as directed. The last time she took albuterol was last night. I recommended that she continue the nebulizers every 6 hours until this attack resolves. Past Medical History:  Diagnosis Date  . 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 Arthritis   . Chronic respiratory failure ([place]) On home 02 2-3L  . Compression fracture of lumbar vertebra ([place])   . COPD (chronic obstructive pulmonary disease) ([place])   . Hyperglycemia, drug-induced   . Hyperlipidemia   . Kidney stones 2014    Left side, multiple  . Osteoporosis 2013  . Shortness of breath   . Thyroid disease    pt denies  . Tobacco abuse    Past Surgical History:  Procedure Laterality Date  . CERVICAL BIOPSY    . COLONOSCOPY N/A 03/31/2013   Procedure: COLONOSCOPY;  Surgeon: Rogene Houston, MD;  [facility];  Service: Endoscopy;  Laterality: N/A;  730  . TUBAL LIGATION     Current Outpatient Prescriptions on File Prior to Visit  Medication Sig Dispense Refill  . albuterol (PROVENTIL HFA;VENTOLIN HFA) 108 (90 Base) MCG/ACT inhaler Inhale 2 puffs into the lungs every 6 (six) hours as needed for wheezing or shortness of breath. 1 Inhaler 11  . albuterol (PROVENTIL) (2.5 MG/3ML) 0.083% nebulizer solution Take 3 mLs (2.5 mg total) by nebulization every 4 (four) hours as needed for wheezing or shortness of breath. 75 mL 11  . alendronate (FOSAMAX) 70 MG tablet Take 1 tablet (70 mg total) by mouth every 7 (seven) days. Take with a full glass of water on an empty stomach.(wednesday) 4 tablet 6  . amoxicillin-clavulanate (AUGMENTIN) 875-125 MG tablet Take 1 tablet by mouth 2 (two) times daily. 20 tablet 0  . aspirin EC 81 MG tablet Take 81 mg by mouth every morning.     . budesonide-formoterol (SYMBICORT) 160-4.5 MCG/ACT inhaler Inhale 2 puffs into the lungs 2 (two) times daily. 1 Inhaler 11  .  fluticasone (FLONASE) 50 MCG/ACT nasal spray Place 1 spray into both nostrils daily as needed for allergies or rhinitis.    . furosemide (LASIX) 20 MG tablet Take 1 tablet (20 mg total) by mouth daily. 30 tablet 3  . guaiFENesin-codeine 100-10 MG/5ML syrup Take 5 mLs by mouth every 6 (six) hours as needed for cough. 180 mL 0  . nystatin (MYCOSTATIN) 100000 UNIT/ML suspension Use as directed 5 mLs (500,000 Units total) in the mouth or throat 4 (four) times daily. 240 mL 3  . oxyCODONE-acetaminophen (ROXICET) 5-325 MG tablet Take 1 tablet by mouth every 8 (eight) hours as needed for severe pain. 30 tablet 0  . OXYGEN  Inhale 2 L into the lungs continuous.    . pantoprazole (PROTONIX) 40 MG tablet take 1 tablet by mouth once daily 90 tablet 1  . predniSONE (DELTASONE) 10 MG tablet Take 1 tablet (10 mg total) by mouth daily with breakfast. 30 tablet 5  . predniSONE (DELTASONE) 20 MG tablet 3 tabs per day on days 1-3, 2 tabs per day on days 4-6 1 tab per day on days 7-9 18 tablet 0  . simvastatin (ZOCOR) 20 MG tablet Take 1 tablet (20 mg total) by mouth at bedtime. 90 tablet 1  . SPIRIVA RESPIMAT 2.5 MCG/ACT AERS INHALE 2 SPRAYS INTO LUNGS DAILY. 4 Inhaler 4   No current facility-administered medications on file prior to visit.    Allergies  Allergen Reactions  . Keflex [Cephalexin] Anaphylaxis and Rash  . Doxycycline Other (See Comments)    Gave patient oral thrush  . Sulfa Antibiotics   . Avelox [Moxifloxacin Hcl In Nacl] Itching and Rash  . Toradol [Ketorolac Tromethamine] Swelling, Rash and Other (See Comments)    Bruising, swelling, rash at injection site   Social History   Social History  . Marital status: Divorced    Spouse name: N/A  . Number of children: 3  . Years of education: N/A   Occupational History  . disabled Disabled   Social History Main Topics  . Smoking status: Current Every Day Smoker    Packs/day: 1.00    Years: 25.00    Types: Cigarettes  . Smokeless tobacco: Never Used  . Alcohol use No  . Drug use: No  . Sexual activity: Not Currently    Birth control/ protection: Surgical   Other Topics Concern  . Not on file   Social History Narrative  . No narrative on file      Review of Systems  All other systems reviewed and are negative.      Objective:   Physical Exam  Constitutional: She appears well-developed and well-nourished.  Neck: Neck supple.  Cardiovascular: Normal rate, regular rhythm and normal heart sounds.   Pulmonary/Chest: No respiratory distress. She has wheezes. She has no rales. She exhibits no tenderness.  Abdominal: Soft. Bowel sounds  are normal.  Musculoskeletal: She exhibits edema.  Lymphadenopathy:    She has no cervical adenopathy.  Vitals reviewed.         Assessment & Plan:  COPD exacerbation ([place])  Her breathing is better. Continue the prednisone taper pack. However I would like her to continue to use albuterol every 6 hours for at least the next week until this attack improves/resolves.  She is scheduled to see her pulmonologist next week on the fifth

## 2016-09-02 ENCOUNTER — Encounter: Payer: Self-pay | Admitting: Emergency Medicine

## 2016-09-02 ENCOUNTER — Ambulatory Visit (INDEPENDENT_AMBULATORY_CARE_PROVIDER_SITE_OTHER): Payer: Medicare Other | Admitting: Emergency Medicine

## 2016-09-02 DIAGNOSIS — J411 Mucopurulent chronic bronchitis: Secondary | ICD-10-CM

## 2016-09-02 DIAGNOSIS — I272 Other secondary pulmonary hypertension: Secondary | ICD-10-CM

## 2016-09-02 DIAGNOSIS — J9611 Chronic respiratory failure with hypoxia: Secondary | ICD-10-CM

## 2016-09-02 DIAGNOSIS — IMO0002 Reserved for concepts with insufficient information to code with codable children: Secondary | ICD-10-CM

## 2016-09-02 NOTE — Assessment & Plan Note (Signed)
Continue same oxygen as ordered 

## 2016-09-02 NOTE — Assessment & Plan Note (Signed)
In the setting of severe lung disease

## 2016-09-02 NOTE — Assessment & Plan Note (Signed)
Progressive worsening with frequent exacerbations. I have tried to stress to her that she will continue to progress unless we're able to stop the cigarettes. She is on maximal medical therapy. The only component that we can impact here is the smoking. For now I'll keep her on her maintenance medicines. She does exacerbate frequently and gets good care from family medicine.   You need to work on decreasing and ultimately stopping your cigarettes.  Continue your Spiriva and Symbicort Take albuterol 2 puffs up to every 4 hours if needed for shortness of breath. We will try to change back to ProAir since you have not benefiited from ventolin.  Continue your prednisone 10mg  daily.  Flu shot today.  Follow with Dr Lamonte Sakai in 3 months or sooner if you have any problems.

## 2016-09-02 NOTE — Patient Instructions (Addendum)
You need to work on decreasing and ultimately stopping your cigarettes.  Continue your Spiriva and Symbicort Take albuterol 2 puffs up to every 4 hours if needed for shortness of breath. We will try to change back to ProAir since you have not benefiited from ventolin.  Continue your prednisone 10mg  daily.  Flu shot today.  Follow with Dr Lamonte Sakai in 3 months or sooner if you have any problems.

## 2016-09-02 NOTE — Progress Notes (Signed)
Subjective:    Patient ID: Cindy [name], female    DOB: 07-16-1956, 60 y.o.   MRN: [id number]  HPI 60 yo woman, smoker (40pk-yrs), hyperlipidemia. Followed by Dr Buelah Manis and on Spiriva + Symbicort + ProAir prn, FEV1 19% predicted on prior PFT! She has had several exacerbations over the last year. Was first noted to be hypoxemic in 2010, is on O2 that she is using prn.  She has exertional wheeze, SOB. Coughs daily - productive white, thick.    ROV 09/04/15 -- follow-up visit for tobacco use and severe COPD. I have had her on chronic prednisone, currently 10 mg daily. She is also using Spiriva and Symbicort.  She is having significant exertional SOB with any activity. She has been using her O2 with exertion. She is having daily cough, brings up thick mucous, no purulence. No change in her cigarettes - 1pk/day. Has not required any extra pred or abx since our last visit.   ROV 01/25/16 -- follow-up visit of tobacco use and severe COPD. She has been managed on chronic prednisone as well as Spiriva and Symbicort. She reports that she has been doing well until she had an AE in 10/16 and then again in 12/16. She required steroids, augmentin. She is currently using albuterol about once a day. She is now back to her usual 10mg  pred daily.   ROV 05/01/16 -- follow-up visit for history of COPD that is oxygen and prednisone dependent, 10mg . She continues to smoke. She is currently on Spiriva and Symbicort. Her most recent exacerbation was in late February - was hospitalized and treated w steroids, abx. Did not require BiPAP. She has been having increased LE edema over the last few months, a TTE was ordered but has not been scheduled. She was started on lasix last month. Her exercise tolerance is poor. Wearing 2L/min but not at all times.   ROV 09/02/16 -- Patient has a history of severe COPD, chronic hypoxemic respiratory failure, chronic prednisone use. She continues to smoke, about 1 pack a day. Maintenance  bronchodilators include Spiriva and Symbicort. Her O2 is at She has had 2 exacerbations since last visit. She was just seen by Dr. Dennard Schaumann 08/22/16 and given Depo-Medrol. She was also started on Augmentin. Overall she believes that she is doing worse. She is having LE edema, is on lasix. She just completed a short pred taper. She has gained weight   PULMONARY FUNCTON TEST 09/27/2012  FVC 2.31  FEV1 .87  FEV1/FVC 37.7  FVC  % Predicted 76  FEV % Predicted 38  FeF 25-75 .3  FeF 25-75 % Predicted 2.65       Objective:   Physical Exam Vitals:   09/02/16 0958  BP: 132/86  BP Location: Left Arm  Cuff Size: Normal  Pulse: (!) 104  SpO2: 94%  Weight: 176 lb (79.8 kg)   Gen: Pleasant, well-nourished, in no distress,   ENT: No lesions,  mouth clear,  oropharynx clear, no postnasal drip  Neck: No JVD, no TMG, no carotid bruits, no stridor  Lungs: No use of accessory muscles, B soft exp wheezes  Cardiovascular: RRR, heart sounds normal, no murmur or gallops, no peripheral edema  Musculoskeletal: No deformities, no cyanosis or clubbing  Neuro: alert, non focal  Skin: Warm, no lesions or rashes     Assessment & Plan:  COPD (chronic obstructive pulmonary disease) Progressive worsening with frequent exacerbations. I have tried to stress to her that she will continue to progress unless we're  able to stop the cigarettes. She is on maximal medical therapy. The only component that we can impact here is the smoking. For now I'll keep her on her maintenance medicines. She does exacerbate frequently and gets good care from family medicine.   You need to work on decreasing and ultimately stopping your cigarettes.  Continue your Spiriva and Symbicort Take albuterol 2 puffs up to every 4 hours if needed for shortness of breath. We will try to change back to ProAir since you have not benefiited from ventolin.  Continue your prednisone 10mg  daily.  Flu shot today.  Follow with Dr Lamonte Sakai in 3  months or sooner if you have any problems.  Chronic hypoxemic respiratory failure (H[facility]) Continue same oxygen as ordered  Secondary pulmonary hypertension ([place]) In the setting of severe lung disease  Baltazar Apo, MD, PhD 09/02/2016, 10:22 AM [facility] [phone] or if no answer [phone]

## 2016-09-04 ENCOUNTER — Telehealth: Payer: Self-pay | Admitting: Emergency Medicine

## 2016-09-04 NOTE — Telephone Encounter (Signed)
Attempted to contact pt. No answer, no option to leave a message. Will try back.  

## 2016-09-05 NOTE — Telephone Encounter (Signed)
ATC, NA and no option to leave [place], [facility] 9/11

## 2016-09-05 NOTE — Telephone Encounter (Signed)
lmtcb x1 for pt. 

## 2016-09-05 NOTE — Telephone Encounter (Signed)
Pt returning call.Cindy [name] ° °

## 2016-09-08 NOTE — Telephone Encounter (Signed)
PA forms received and completed and placed in RB look at for when he returns.  Will forward to lindsay to follow up on.

## 2016-09-08 NOTE — Telephone Encounter (Signed)
Called and spoke with pt and she stated that the proair is needing a PA done.  I advised the pt that the PA would be initiated.    Will call the pharmacy to see what insurance the pt is using.  She only has medicaid listed in our system and not [employer].

## 2016-09-10 NOTE — Telephone Encounter (Signed)
I have stamped RB's signature to this form. It has been faxed in. Will await PA decision.

## 2016-09-11 NOTE — Telephone Encounter (Signed)
Patient returning our call- she can be reached at [phone]

## 2016-09-11 NOTE — Telephone Encounter (Signed)
PA has been approved through 12/28/16. lmtcb x1 for pt.

## 2016-09-11 NOTE — Telephone Encounter (Signed)
I called spoke with pt and is aware of response below. Nothing further needed

## 2016-10-02 ENCOUNTER — Telehealth: Payer: Self-pay | Admitting: Family Medicine

## 2016-10-02 ENCOUNTER — Other Ambulatory Visit: Payer: Self-pay | Admitting: Family Medicine

## 2016-10-02 DIAGNOSIS — K219 Gastro-esophageal reflux disease without esophagitis: Secondary | ICD-10-CM

## 2016-10-02 NOTE — Telephone Encounter (Addendum)
Patient requesting refill on robitussin AC.   Ok to refill?

## 2016-10-02 NOTE — Telephone Encounter (Signed)
Cindy [name] called saying she was told by her pharmacy that an authorization is needed for the following three medications: 1. Oxycodone 2. Fosamax 3. Guaifenesin  Please call Cindy [name] if you have questions or concerns.  Pt's ph# [phone] Thank you.

## 2016-10-02 NOTE — Telephone Encounter (Signed)
Cindy [name] called back saying she does NOT need a refill on the Fosamax. She needs a refill on Protonix. She needs the 90 day supply. Please advise. Thank you.  Pt's ph# [phone]

## 2016-10-02 NOTE — Telephone Encounter (Signed)
Ok to refill pain medication?

## 2016-10-03 ENCOUNTER — Other Ambulatory Visit: Payer: Self-pay | Admitting: Emergency Medicine

## 2016-10-03 MED ORDER — GUAIFENESIN-CODEINE 100-10 MG/5ML PO SOLN: 5.0000 mL | Freq: Four times a day (QID) | ORAL | 0 refills | Status: DC | PRN

## 2016-10-03 NOTE — Telephone Encounter (Signed)
Prescription printed and patient made aware to come to office to pick up after 4pm on 10/03/2016.

## 2016-10-03 NOTE — Telephone Encounter (Signed)
Patient called stating she needs her stomach pill to be refilled and her oxycodone. Please advise

## 2016-10-03 NOTE — Telephone Encounter (Signed)
Okay to refill the pain med, but she can not take with the cough medicine

## 2016-10-03 NOTE — Telephone Encounter (Signed)
Per next message, patient does not need refill on Fosamax.   Robitussin called to pharmacy.

## 2016-10-03 NOTE — Telephone Encounter (Signed)
She can refill the cough syrup  She has to come in to get the oxycodone Okay to do the fosamax

## 2016-11-18 ENCOUNTER — Ambulatory Visit (INDEPENDENT_AMBULATORY_CARE_PROVIDER_SITE_OTHER): Payer: Medicare Other | Admitting: Family Medicine

## 2016-11-18 ENCOUNTER — Encounter: Payer: Self-pay | Admitting: Family Medicine

## 2016-11-18 VITALS — BP 130/86 | HR 108 | Temp 98.7°F | Resp 24 | Ht 61.0 in | Wt 182.0 lb

## 2016-11-18 DIAGNOSIS — R7303 Prediabetes: Secondary | ICD-10-CM

## 2016-11-18 DIAGNOSIS — J441 Chronic obstructive pulmonary disease with (acute) exacerbation: Secondary | ICD-10-CM

## 2016-11-18 DIAGNOSIS — J9611 Chronic respiratory failure with hypoxia: Secondary | ICD-10-CM

## 2016-11-18 DIAGNOSIS — K5900 Constipation, unspecified: Secondary | ICD-10-CM | POA: Diagnosis not present

## 2016-11-18 DIAGNOSIS — E7800 Pure hypercholesterolemia, unspecified: Secondary | ICD-10-CM

## 2016-11-18 MED ORDER — IPRATROPIUM-ALBUTEROL 0.5-2.5 (3) MG/3ML IN SOLN
3.0000 mL | Freq: Once | RESPIRATORY_TRACT | Status: AC
  Administered 2016-11-18: 3 mL via RESPIRATORY_TRACT

## 2016-11-18 MED ORDER — LINACLOTIDE 145 MCG PO CAPS: 145.0000 ug | ORAL_CAPSULE | Freq: Every day | ORAL | 0 refills | Status: DC

## 2016-11-18 MED ORDER — METHYLPREDNISOLONE ACETATE 80 MG/ML IJ SUSP
80.0000 mg | Freq: Once | INTRAMUSCULAR | Status: AC
Start: 2016-11-18 — End: 2016-11-18
  Administered 2016-11-18: 80 mg via INTRAMUSCULAR

## 2016-11-18 MED ORDER — ALENDRONATE SODIUM 70 MG PO TABS: 70.0000 mg | ORAL_TABLET | ORAL | 6 refills | Status: DC

## 2016-11-18 MED ORDER — AZITHROMYCIN 500 MG PO TABS: 500.0000 mg | ORAL_TABLET | Freq: Every day | ORAL | 0 refills | Status: DC

## 2016-11-18 MED ORDER — SIMVASTATIN 20 MG PO TABS: 20.0000 mg | ORAL_TABLET | Freq: Every day | ORAL | 1 refills | Status: DC

## 2016-11-18 MED ORDER — PREDNISONE 10 MG PO TABS: ORAL_TABLET | ORAL | 0 refills | Status: DC

## 2016-11-18 NOTE — Patient Instructions (Addendum)
Take prednisone Use Delsym or mucinex DM Try the linzess 1 capsule a day We will call with lab results F/U 1 week for recheck

## 2016-11-18 NOTE — Progress Notes (Signed)
Subjective:    Patient ID: Cindy [name], female    DOB: 1956-07-08, 60 y.o.   MRN: [id number]  Patient presents for COPD Exacerbation (SOB, wheezing) and Constipation (states that she has hard stools )  Patient here with COPD exacerbation. States she is in bed for a couple weeks. She is using her oxygen her her typical regimen she is also on chronic prednisone and she is end-stage CO PD. She's been using some Mucinex. She has not had any fever. - She continues to smoke, she did get her flu shot Also having problems with constipation and she is passing small balls she's been using Mylanta Ex-Lax and drinking prune juice which helped some.  Weight gain her weight is up 20 pounds over the past year as her COPD is worse and she's required multiple dose of prednisone and is now on chronic prednisone daily  Review Of Systems:  GEN- + fatigue, fever, weight loss,weakness, recent illness HEENT- denies eye drainage, change in vision, nasal discharge, CVS- denies chest pain, palpitations RESP- +SOB, +cough, +wheeze ABD- denies N/V, change in stools, abd pain GU- denies dysuria, hematuria, dribbling, incontinence MSK- denies joint pain, muscle aches, injury Neuro- denies headache, dizziness, syncope, seizure activity       Objective:    BP 130/86   Pulse (!) 108   Temp 98.7 F (37.1 C) (Oral)   Resp (!) 24   Ht 5\' 1"  (1.549 m)   Wt 182 lb (82.6 kg)   SpO2 95%   BMI 34.39 kg/m  GEN- NAD, alert and oriented x 3  HEENT- PERRL, EOMI, non injected sclera, pink conjunctiva, MMM, oropharynx clear Neck- Supple, no LAD, +retractions  CVS- RRR, no murmur RESP- bilat bronchospasm, decreased air movement, audible wheeze and grunting at times. Rhonchi,  ABD- NABS,soft,NT,ND Ext- no edema  S/P duoneb WOB improved,       Assessment & Plan:      Problem List Items Addressed This Visit    Hyperlipidemia   Relevant Medications   simvastatin (ZOCOR) 20 MG tablet   Chronic hypoxemic  respiratory failure (HCC)    Continue oxygen therapy She is here today with typical COPD exacerbation. Given Depo-Medrol 81 give her prednisone taper and then she will get back to her 10 mg maintenance. She will continue nebulizers every 4 hours as needed. She has oxygen of course. I will also put her on azithromycin 500 mg for 5 days she has used Augmentin in the last 3-4 times during exacerbations. Unfortunately she does continue to smoke. She is gaining significant weight however she requires of prednisone to help her breathe. I don't see anything else to intervene on with regards to her COPD and her smoking.   For her constipation I have given her samples of Linzess  She is borderline diabetic for recheck an A1c and she is on chronic prednisone       Other Visit Diagnoses    COPD exacerbation ([facility])    -  Primary   Relevant Medications   methylPREDNISolone acetate (DEPO-MEDROL) injection 80 mg (Completed)   ipratropium-albuterol (DUONEB) 0.5-2.5 (3) MG/3ML nebulizer solution 3 mL (Completed)   predniSONE (DELTASONE) 10 MG tablet   azithromycin (ZITHROMAX) 500 MG tablet   Other Relevant Orders   CBC with Differential/Platelet   Constipation, unspecified constipation type       Borderline diabetes       Relevant Orders   CBC with Differential/Platelet   Comprehensive metabolic panel   Hemoglobin A1c  Note: This dictation was prepared with Dragon dictation along with smaller phrase technology. Any transcriptional errors that result from this process are unintentional.

## 2016-11-18 NOTE — Assessment & Plan Note (Addendum)
Continue oxygen therapy She is here today with typical COPD exacerbation. Given Depo-Medrol 81 give her prednisone taper and then she will get back to her 10 mg maintenance. She will continue nebulizers every 4 hours as needed. She has oxygen of course. I will also put her on azithromycin 500 mg for 5 days she has used Augmentin in the last 3-4 times during exacerbations. Unfortunately she does continue to smoke. She is gaining significant weight however she requires of prednisone to help her breathe. I don't see anything else to intervene on with regards to her COPD and her smoking.   For her constipation I have given her samples of Linzess  She is borderline diabetic for recheck an A1c and she is on chronic prednisone

## 2016-11-19 LAB — CBC WITH DIFFERENTIAL/PLATELET
BASOS ABS: 0 {cells}/uL (ref 0–200)
BASOS PCT: 0 %
EOS PCT: 0 %
Eosinophils Absolute: 0 cells/uL — ABNORMAL LOW (ref 15–500)
HCT: 44.1 % (ref 35.0–45.0)
HEMOGLOBIN: 14.1 g/dL (ref 12.0–15.0)
LYMPHS ABS: 1768 {cells}/uL (ref 850–3900)
Lymphocytes Relative: 13 %
MCH: 30.7 pg (ref 27.0–33.0)
MCHC: 32 g/dL (ref 32.0–36.0)
MCV: 95.9 fL (ref 80.0–100.0)
MONOS PCT: 6 %
MPV: 12.2 fL (ref 7.5–12.5)
Monocytes Absolute: 816 cells/uL (ref 200–950)
NEUTROS ABS: 11016 {cells}/uL — AB (ref 1500–7800)
Neutrophils Relative %: 81 %
PLATELETS: 244 10*3/uL (ref 140–400)
RBC: 4.6 MIL/uL (ref 3.80–5.10)
RDW: 13.5 % (ref 11.0–15.0)
WBC: 13.6 10*3/uL — ABNORMAL HIGH (ref 3.8–10.8)

## 2016-11-19 LAB — COMPREHENSIVE METABOLIC PANEL
ALBUMIN: 4.1 g/dL (ref 3.6–5.1)
ALK PHOS: 52 U/L (ref 33–130)
ALT: 27 U/L (ref 6–29)
AST: 22 U/L (ref 10–35)
BUN: 12 mg/dL (ref 7–25)
CHLORIDE: 99 mmol/L (ref 98–110)
CO2: 38 mmol/L — ABNORMAL HIGH (ref 20–31)
Calcium: 9.7 mg/dL (ref 8.6–10.4)
Creat: 0.94 mg/dL (ref 0.50–0.99)
Glucose, Bld: 115 mg/dL — ABNORMAL HIGH (ref 70–99)
Potassium: 4.3 mmol/L (ref 3.5–5.3)
SODIUM: 143 mmol/L (ref 135–146)
TOTAL PROTEIN: 6.4 g/dL (ref 6.1–8.1)
Total Bilirubin: 0.2 mg/dL (ref 0.2–1.2)

## 2016-11-19 LAB — HEMOGLOBIN A1C
Hgb A1c MFr Bld: 5.8 % — ABNORMAL HIGH (ref <5.7)
Mean Plasma Glucose: 120 mg/dL

## 2016-12-01 ENCOUNTER — Telehealth: Payer: Self-pay | Admitting: Family Medicine

## 2016-12-01 NOTE — Telephone Encounter (Signed)
Call placed to patient.   Patient reports multiple complaints.   Firstly, patient reports that she continues to have increased SOB, even without exertion. States that she is having difficulty breathing, and she has increased O2 up to 3L/min. Patient states that her chest feels "raw" and like she is not getting any air. Requested prescription for Augmentin. Patient was to F/U in (1) week after last OV for COPD exacerbation (11/24/2016). Patient states that she will not be able to come in since she is waiting for O2 delivery. Advised that SOB needs to be evaluated. Advised that O2 delivery can be re-scheduled. Advised to go to ER for eval. Patient states that if breathing worsens, she will go to ER.   Secondly, patient reports that she has had lower abd pain. States that she had loose stools and abd cramping while taking Linzess for the first few days, but now has resolved. Reports no further issues at this time with ABD cramping.   Lastly, patient reports vaginal pain. State that if feels like there is pressure in her vaginal area. Reports that pressure worsened with abd cramping, but has since lessened. Reports that she think it may be due to her hard stools moving through her colon. Again advised OV. Again patient declined.   MD to be made aware.

## 2016-12-01 NOTE — Telephone Encounter (Signed)
Patient is having several issues, would like you to call her regarding these issues and maybe getting some augmentin called in if possible  [phone]

## 2016-12-01 NOTE — Telephone Encounter (Signed)
Noted she needs to go to ER for her COPD, she was recently treated and not improving, likley needs admission. Also noted she declines OV

## 2016-12-11 ENCOUNTER — Encounter: Payer: Self-pay | Admitting: Emergency Medicine

## 2016-12-11 ENCOUNTER — Ambulatory Visit: Payer: Medicare Other | Admitting: Emergency Medicine

## 2016-12-11 ENCOUNTER — Telehealth: Payer: Self-pay | Admitting: Emergency Medicine

## 2016-12-11 ENCOUNTER — Ambulatory Visit (INDEPENDENT_AMBULATORY_CARE_PROVIDER_SITE_OTHER): Payer: Medicare Other | Admitting: Emergency Medicine

## 2016-12-11 DIAGNOSIS — J411 Mucopurulent chronic bronchitis: Secondary | ICD-10-CM

## 2016-12-11 DIAGNOSIS — J209 Acute bronchitis, unspecified: Secondary | ICD-10-CM | POA: Diagnosis not present

## 2016-12-11 DIAGNOSIS — Z720 Tobacco use: Secondary | ICD-10-CM

## 2016-12-11 MED ORDER — AMOXICILLIN-POT CLAVULANATE 875-125 MG PO TABS: 1.0000 | ORAL_TABLET | Freq: Two times a day (BID) | ORAL | 0 refills | Status: DC

## 2016-12-11 MED ORDER — ROFLUMILAST 500 MCG PO TABS: 500.0000 ug | ORAL_TABLET | Freq: Every day | ORAL | 6 refills | Status: DC

## 2016-12-11 NOTE — Telephone Encounter (Signed)
Per OV notes from today with Dr Brock Ra,  Pt calling to request refill of Daliresp. This has been sent. Nothing further needed.  Please stay on Spiriva and Symbicort as you are taking them  Take doxycycline 100mg  twice a dya for 10 days Take albuterol 2 puffs up to every 4 hours if needed for shortness of breath.  Use your albuterol nebulized up to every 4 hours if needed for shortness of breath or wheeze.  Continue your oxygen as you are using it.  Continue to work on decreasing your cigarettes. We will restart daliresp 500 mcg once a day.  Follow with Dr Lamonte Sakai in 3 months or sooner if you have any problems.

## 2016-12-11 NOTE — Assessment & Plan Note (Signed)
Please stay on Spiriva and Symbicort as you are taking them  Take doxycycline 100mg  twice a dya for 10 days Take albuterol 2 puffs up to every 4 hours if needed for shortness of breath.  Use your albuterol nebulized up to every 4 hours if needed for shortness of breath or wheeze.  Continue your oxygen as you are using it.  Continue to work on decreasing your cigarettes. We will restart daliresp 500 mcg once a day.  Follow with Dr Lamonte Sakai in 3 months or sooner if you have any problems.

## 2016-12-11 NOTE — Patient Instructions (Addendum)
Please stay on Spiriva and Symbicort as you are taking them  Take doxycycline 100mg  twice a dya for 10 days Take albuterol 2 puffs up to every 4 hours if needed for shortness of breath.  Use your albuterol nebulized up to every 4 hours if needed for shortness of breath or wheeze.  Continue your oxygen as you are using it.  Continue to work on decreasing your cigarettes. We will restart daliresp 500 mcg once a day.  Follow with Dr Lamonte Sakai in 3 months or sooner if you have any problems.

## 2016-12-11 NOTE — Addendum Note (Signed)
Addended by: Benson Setting L on: 12/11/2016 11:11 AM   Modules accepted: Orders

## 2016-12-11 NOTE — Assessment & Plan Note (Signed)
Discussed cessation today - she is not ready to stop

## 2016-12-11 NOTE — Progress Notes (Signed)
Subjective:    Patient ID: Cindy [name], female    DOB: 01/24/1956, 60 y.o.   MRN: [id number]  HPI 60 yo woman, smoker (40pk-yrs), hyperlipidemia. Followed by Dr Buelah Manis and on Spiriva + Symbicort + ProAir prn, FEV1 19% predicted on prior PFT! She has had several exacerbations over the last year. Was first noted to be hypoxemic in 2010, is on O2 that she is using prn.  Chronic prednisone > 10mg  daily   ROV 05/01/16 -- follow-up visit for history of COPD that is oxygen and prednisone dependent, 10mg . She continues to smoke. She is currently on Spiriva and Symbicort. Her most recent exacerbation was in late February - was hospitalized and treated w steroids, abx. Did not require BiPAP. She has been having increased LE edema over the last few months, a TTE was ordered but has not been scheduled. She was started on lasix last month. Her exercise tolerance is poor. Wearing 2L/min but not at all times.   ROV 09/02/16 -- Patient has a history of severe COPD, chronic hypoxemic respiratory failure, chronic prednisone use. She continues to smoke, about 1 pack a day. Maintenance bronchodilators include Spiriva and Symbicort. Her O2 is at She has had 2 exacerbations since last visit. She was just seen by Dr. Dennard Schaumann 08/22/16 and given Depo-Medrol. She was also started on Augmentin. Overall she believes that she is doing worse. She is having LE edema, is on lasix. She just completed a short pred taper. She has gained weight  ROV 12/11/16 -- Follow-up visit for tobacco abuse, severe COPD, chronic hypoxemic respiratory failure. She has been managed on Spiriva, Symbicort, chronic daily prednisone 10mg . She was treated for another AE-COPD 11/18/16 by Dr Buelah Manis. Darrick Meigs + pred taper. She is having multiple exacerbations a year - probably 8-10 in the last year. Continues to have increased secxretions and purulent cough   PULMONARY FUNCTON TEST 09/27/2012  FVC 2.31  FEV1 .87  FEV1/FVC 37.7  FVC  % Predicted 76  FEV %  Predicted 38  FeF 25-75 .3  FeF 25-75 % Predicted 2.65       Objective:   Physical Exam Vitals:   12/11/16 1045  BP: 108/72  BP Location: Right Arm  Patient Position: Sitting  Cuff Size: Normal  Pulse: 96  SpO2: 96%  Weight: 184 lb (83.5 kg)  Height: 5\' 1"  (1.549 m)   Gen: Pleasant, well-nourished, in no distress,   ENT: No lesions,  mouth clear,  oropharynx clear, no postnasal drip  Neck: No JVD, no TMG, no carotid bruits, no stridor  Lungs: No use of accessory muscles, B soft exp wheezes  Cardiovascular: RRR, heart sounds normal, no murmur or gallops, no peripheral edema  Musculoskeletal: No deformities, no cyanosis or clubbing  Neuro: alert, non focal  Skin: Warm, no lesions or rashes     Assessment & Plan:  COPD (chronic obstructive pulmonary disease) Please stay on Spiriva and Symbicort as you are taking them  Take doxycycline 100mg  twice a dya for 10 days Take albuterol 2 puffs up to every 4 hours if needed for shortness of breath.  Use your albuterol nebulized up to every 4 hours if needed for shortness of breath or wheeze.  Continue your oxygen as you are using it.  Continue to work on decreasing your cigarettes. We will restart daliresp 500 mcg once a day.  Follow with Dr Lamonte Sakai in 3 months or sooner if you have any problems.  Tobacco abuse Discussed cessation today - she is  not ready to stop  Acute bronchitis Doxycycline x 7 days at her request  Baltazar Apo, MD, PhD 12/11/2016, 11:04 AM [facility] [phone] or if no answer [phone]

## 2016-12-11 NOTE — Assessment & Plan Note (Signed)
Doxycycline x 7 days at her request

## 2016-12-31 ENCOUNTER — Other Ambulatory Visit: Payer: Self-pay | Admitting: Family Medicine

## 2016-12-31 DIAGNOSIS — J441 Chronic obstructive pulmonary disease with (acute) exacerbation: Secondary | ICD-10-CM

## 2017-01-01 ENCOUNTER — Other Ambulatory Visit: Payer: Self-pay | Admitting: Family Medicine

## 2017-01-01 DIAGNOSIS — J441 Chronic obstructive pulmonary disease with (acute) exacerbation: Secondary | ICD-10-CM

## 2017-01-09 ENCOUNTER — Telehealth: Payer: Self-pay | Admitting: Family Medicine

## 2017-01-09 MED ORDER — ALBUTEROL SULFATE (2.5 MG/3ML) 0.083% IN NEBU: 2.5000 mg | INHALATION_SOLUTION | RESPIRATORY_TRACT | 11 refills | Status: DC | PRN

## 2017-01-09 NOTE — Telephone Encounter (Signed)
Prescription sent to pharmacy.

## 2017-01-09 NOTE — Telephone Encounter (Signed)
Patient requesting a refill on her albuterol (PROVENTIL) (2.5 MG/3ML) 0.083% nebulizer solution [250] [facility] in [place]  CB# [phone]

## 2017-01-12 ENCOUNTER — Telehealth: Payer: Self-pay | Admitting: Emergency Medicine

## 2017-01-12 NOTE — Telephone Encounter (Signed)
Cindy [name] called upset because she has not been updated on the prior auth of her medication. I informed the pt. That her last medication was filled at another office, and that PA would be sent to their office and she would have contact their office to check on the status of this. Informed the pt. We can not sign off on PA from another office. Pt. Stated she will contact that office. Nothing further is needed at this time.

## 2017-01-12 NOTE — Telephone Encounter (Signed)
Spoke with pt. And gave her MW recc. Pt. States she has her own pulse ox and she wants to monitor herself on the 2.5L to check her o2 on exertion to see if that keeps her above 90. If this does not the pt. Is aware to contact our office. Nothing further is needed at this time.

## 2017-01-12 NOTE — Telephone Encounter (Signed)
Spoke with pt, who states she is currently on 2L O2. Pt states she has been checking her O2 levels, she states after 20 feet of exertion her O2 dropped to 83. Pt states she has increased her O2 to 2.5L.Pt's is also concerned about her pulse being in the 130's with mild exertion.  MW please advise in RB's absence. Thanks.   Patient Instructions    Please stay on Spiriva and Symbicort as you are taking them  Take doxycycline 100mg  twice a dya for 10 days Take albuterol 2 puffs up to every 4 hours if needed for shortness of breath.  Use your albuterol nebulized up to every 4 hours if needed for shortness of breath or wheeze.  Continue your oxygen as you are using it.  Continue to work on decreasing your cigarettes. We will restart daliresp 500 mcg once a day.  Follow with Dr Lamonte Sakai in 3 months or sooner if you have any problems.

## 2017-01-12 NOTE — Telephone Encounter (Signed)
As long as pulse back to < 100 at rest this is probably ok  Need to adjust 02 with exertion to maintain >90% and should purchase oximeter for this if doesn't already have one

## 2017-02-26 ENCOUNTER — Telehealth: Payer: Self-pay | Admitting: Family Medicine

## 2017-02-26 NOTE — Telephone Encounter (Signed)
No order for hydrocodone listed.   Patient does take Oxycodone PRN.   Ok to refill??  Last office visit 11/18/2016.  Last refill 10/03/2016.

## 2017-02-26 NOTE — Telephone Encounter (Signed)
Patient calling to get rx for her hydrocodone  Know it will be tomorrow before it will be signed  [phone]

## 2017-02-27 MED ORDER — OXYCODONE-ACETAMINOPHEN 5-325 MG PO TABS: ORAL_TABLET | ORAL | 0 refills | Status: DC

## 2017-02-27 NOTE — Telephone Encounter (Signed)
Okay to refill? 

## 2017-02-27 NOTE — Telephone Encounter (Signed)
Prescription printed and patient made aware to come to office to pick up after 2pm on 02/27/2017.

## 2017-03-26 ENCOUNTER — Ambulatory Visit (INDEPENDENT_AMBULATORY_CARE_PROVIDER_SITE_OTHER): Payer: Medicare Other | Admitting: Emergency Medicine

## 2017-03-26 ENCOUNTER — Encounter: Payer: Self-pay | Admitting: Emergency Medicine

## 2017-03-26 VITALS — BP 138/84 | HR 125 | Ht 61.0 in | Wt 189.0 lb

## 2017-03-26 DIAGNOSIS — J411 Mucopurulent chronic bronchitis: Secondary | ICD-10-CM

## 2017-03-26 DIAGNOSIS — J9611 Chronic respiratory failure with hypoxia: Secondary | ICD-10-CM

## 2017-03-26 MED ORDER — AZITHROMYCIN 250 MG PO TABS: ORAL_TABLET | ORAL | 0 refills | Status: DC

## 2017-03-26 MED ORDER — METHYLPREDNISOLONE ACETATE 80 MG/ML IJ SUSP
80.0000 mg | Freq: Once | INTRAMUSCULAR | Status: AC
  Administered 2017-03-26: 80 mg via INTRAMUSCULAR

## 2017-03-26 NOTE — Patient Instructions (Addendum)
Continue your mucinex 600mg  daily Increase your oxygen to 3L/min Please continue your Spiriva and Symbicort as you are taking them.  Continue to use albuterol, either 2 puffs or your nebulizer, up to every 4 hours as needed.  Depo-medrol shot today.  Continue your prednisone 10mg  daily  Take azithromycin as directed until completely gone.  Follow with Dr Lamonte Sakai in 3 months or sooner if you have any problems.

## 2017-03-26 NOTE — Assessment & Plan Note (Signed)
Severe disease with chronic daily symptoms. She continues to smoke. She has difficulty giving history. Unclear that she has an AE but she does have daily significant symptoms at likely merit treatment. I discussed a prednisone taper but she is worried about side effects and "wants the shot" instead. She still smokes, is in denial regarding its effects and importance of cessation.   Continue your mucinex 600mg  daily Increase your oxygen to 3L/min Please continue your Spiriva and Symbicort as you are taking them.  Continue to use albuterol, either 2 puffs or your nebulizer, up to every 4 hours as needed.  Depo-medrol shot today.  Continue your prednisone 10mg  daily  Take azithromycin as directed until completely gone.  Follow with Dr Lamonte Sakai in 3 months or sooner if you have any problems.

## 2017-03-26 NOTE — Progress Notes (Signed)
Subjective:    Patient ID: Cindy [name], female    DOB: 03-26-1956, 61 y.o.   MRN: [id number]  HPI 61 yo woman, smoker (40pk-yrs), hyperlipidemia. Followed by Dr Buelah Manis and on Spiriva + Symbicort + ProAir prn, FEV1 19% predicted on prior PFT! She has had several exacerbations over the last year. Was first noted to be hypoxemic in 2010, is on O2 that she is using prn.  Chronic prednisone > 10mg  daily   ROV 05/01/16 -- follow-up visit for history of COPD that is oxygen and prednisone dependent, 10mg . She continues to smoke. She is currently on Spiriva and Symbicort. Her most recent exacerbation was in late February - was hospitalized and treated w steroids, abx. Did not require BiPAP. She has been having increased LE edema over the last few months, a TTE was ordered but has not been scheduled. She was started on lasix last month. Her exercise tolerance is poor. Wearing 2L/min but not at all times.   ROV 09/02/16 -- Patient has a history of severe COPD, chronic hypoxemic respiratory failure, chronic prednisone use. She continues to smoke, about 1 pack a day. Maintenance bronchodilators include Spiriva and Symbicort. Her O2 is at She has had 2 exacerbations since last visit. She was just seen by Dr. Dennard Schaumann 08/22/16 and given Depo-Medrol. She was also started on Augmentin. Overall she believes that she is doing worse. She is having LE edema, is on lasix. She just completed a short pred taper. She has gained weight  ROV 12/11/16 -- Follow-up visit for tobacco abuse, severe COPD, chronic hypoxemic respiratory failure. She has been managed on Spiriva, Symbicort, chronic daily prednisone 10mg . She was treated for another AE-COPD 11/18/16 by Dr Buelah Manis. Darrick Meigs + pred taper. She is having multiple exacerbations a year - probably 8-10 in the last year. Continues to have increased secxretions and purulent cough  ROV 03/26/17 -- Patient has a history of continued tobacco use, severe COPD, chronic hypoxemic respiratory  failure with frequent exacerbations. She is on pred 10mg  daily, daliresp. BD's include Spiriva and symbicort. Her last abx and pred taper was January 2018.  She describes more LE edema. She has noticed exertional desats and then tachycardia. She is wearing 2-2.5L/min O2.  She is having some cough but no increase from her baseline. I discussed the need to treat her with prednisone taper - she wants to avoid this, wants a depo-medrol shot instead.    PULMONARY FUNCTON TEST 09/27/2012  FVC 2.31  FEV1 .87  FEV1/FVC 37.7  FVC  % Predicted 76  FEV % Predicted 38  FeF 25-75 .3  FeF 25-75 % Predicted 2.65       Objective:   Physical Exam Vitals:   03/26/17 1024  BP: 138/84  Pulse: (!) 125  SpO2: 92%  Weight: 189 lb (85.7 kg)  Height: 5\' 1"  (1.549 m)   Gen: Pleasant, well-nourished, in no distress,   ENT: No lesions,  mouth clear,  oropharynx clear, no postnasal drip  Neck: No JVD, no TMG, no carotid bruits, no stridor  Lungs: No use of accessory muscles, B soft exp wheezes  Cardiovascular: RRR, heart sounds normal, no murmur or gallops, no peripheral edema  Musculoskeletal: No deformities, no cyanosis or clubbing  Neuro: alert, non focal  Skin: Warm, no lesions or rashes     Assessment & Plan:  Chronic hypoxemic respiratory failure (HCC) Severe disease with chronic daily symptoms. She continues to smoke. She has difficulty giving history. Unclear that she has an AE  but she does have daily significant symptoms at likely merit treatment. I discussed a prednisone taper but she is worried about side effects and "wants the shot" instead. She still smokes, is in denial regarding its effects and importance of cessation.   Continue your mucinex 600mg  daily Increase your oxygen to 3L/min Please continue your Spiriva and Symbicort as you are taking them.  Continue to use albuterol, either 2 puffs or your nebulizer, up to every 4 hours as needed.  Depo-medrol shot today.  Continue your  prednisone 10mg  daily  Take azithromycin as directed until completely gone.  Follow with Dr Lamonte Sakai in 3 months or sooner if you have any problems.  Baltazar Apo, MD, PhD 03/26/2017, 10:48 AM [facility] [phone] or if no answer [phone]

## 2017-03-30 ENCOUNTER — Other Ambulatory Visit: Payer: Self-pay | Admitting: Family Medicine

## 2017-03-30 DIAGNOSIS — K219 Gastro-esophageal reflux disease without esophagitis: Secondary | ICD-10-CM

## 2017-04-05 ENCOUNTER — Other Ambulatory Visit: Payer: Self-pay | Admitting: Emergency Medicine

## 2017-04-07 ENCOUNTER — Encounter: Payer: Self-pay | Admitting: Family Medicine

## 2017-04-07 ENCOUNTER — Ambulatory Visit (INDEPENDENT_AMBULATORY_CARE_PROVIDER_SITE_OTHER): Payer: Medicare Other | Admitting: Family Medicine

## 2017-04-07 VITALS — BP 132/76 | HR 104 | Temp 98.4°F | Resp 24 | Ht 61.0 in | Wt 188.0 lb

## 2017-04-07 DIAGNOSIS — Z720 Tobacco use: Secondary | ICD-10-CM | POA: Diagnosis not present

## 2017-04-07 DIAGNOSIS — K219 Gastro-esophageal reflux disease without esophagitis: Secondary | ICD-10-CM | POA: Diagnosis not present

## 2017-04-07 DIAGNOSIS — R739 Hyperglycemia, unspecified: Secondary | ICD-10-CM | POA: Diagnosis not present

## 2017-04-07 DIAGNOSIS — M816 Localized osteoporosis [Lequesne]: Secondary | ICD-10-CM

## 2017-04-07 DIAGNOSIS — T50905A Adverse effect of unspecified drugs, medicaments and biological substances, initial encounter: Secondary | ICD-10-CM | POA: Diagnosis not present

## 2017-04-07 DIAGNOSIS — J411 Mucopurulent chronic bronchitis: Secondary | ICD-10-CM | POA: Diagnosis not present

## 2017-04-07 DIAGNOSIS — B370 Candidal stomatitis: Secondary | ICD-10-CM

## 2017-04-07 DIAGNOSIS — Z1231 Encounter for screening mammogram for malignant neoplasm of breast: Secondary | ICD-10-CM

## 2017-04-07 DIAGNOSIS — M7989 Other specified soft tissue disorders: Secondary | ICD-10-CM

## 2017-04-07 DIAGNOSIS — Z1239 Encounter for other screening for malignant neoplasm of breast: Secondary | ICD-10-CM

## 2017-04-07 DIAGNOSIS — E7800 Pure hypercholesterolemia, unspecified: Secondary | ICD-10-CM

## 2017-04-07 MED ORDER — GUAIFENESIN-CODEINE 100-10 MG/5ML PO SOLN: 5.0000 mL | Freq: Four times a day (QID) | ORAL | 0 refills | Status: DC | PRN

## 2017-04-07 MED ORDER — NYSTATIN 100000 UNIT/ML MT SUSP: 5.0000 mL | Freq: Four times a day (QID) | OROMUCOSAL | 3 refills | Status: DC

## 2017-04-07 NOTE — Progress Notes (Signed)
Subjective:    Patient ID: Cindy [name], female    DOB: [birth year], 61 y.o.   MRN: [id number]  Patient presents for Follow-up and COPD Exacerbation Patient here to follow-up chronic medical problems. Her medications were reviewed. She is being followed by pulmonary for her severe end-stage COPD she is on oxygen therapy as multiple exacerbations. She does continue to smoke. She was recently treated by her pulmonologist at the end of March with azithromycin. She is on chronic prednisone 10 mg every day. Her last A1C was 5.8%  4months ago.Also on 2.5L of oxygen. He is to gain weight in the abdominal region makes it more difficult for her breathing. States she can barely move around without having trouble breathing and dyspnea and progressively worse over the past year She kept stating he could tell her eyes to her breathing was bad, she states they felt" irritated and sunken in"   Osteoporosis with history of compression fracture. She is currently on Fosamax. She has chronic back pain and also intermittent flank pain secondary to history of kidney stones. She uses hydrocodone intermittently Due for Bone Density  She would like mammogram  History of peripheral edema she takes Lasix as needed, gets worse with prednisone   Hyperlipidemia she takes simvastatin as prescribed,due for lipid panel  Reflux she is on protonix    Review Of Systems:  GEN- denies fatigue, fever, weight loss,weakness, recent illness HEENT- denies eye drainage, change in vision, nasal discharge, CVS- denies chest pain, palpitations RESP- + SOB, cough, wheeze ABD- denies N/V, change in stools, abd pain GU- denies dysuria, hematuria, dribbling, incontinence MSK- + joint pain, muscle aches, injury Neuro- denies headache, dizziness, syncope, seizure activity       Objective:    BP 132/76   Pulse (!) 104   Temp 98.4 F (36.9 C) (Oral)   Resp (!) 24   Ht 5\' 1"  (1.549 m)   Wt 188 lb (85.3 kg)   SpO2 96%   BMI  35.52 kg/m  GEN- NAD, alert and oriented x 3  HEENT- PERRL, EOMI, non injected sclera, pink conjunctiva, MMM, oropharynx mild thrush Neck- Supple, no LAD, no etractions  CVS- mild tachycardia , no murmur RESP- bilat bronchospasm, fair air movement but comfortable at rest ,grunting at times. ABD- NABS,soft,NT,ND ABD-NABS,soft,NT,ND Ext- pedal  edema       Assessment & Plan:      Problem List Items Addressed This Visit    Tobacco abuse (Chronic)    Continues to smoke       Thrush    Nystatin given, due to chronic prednisone and inhalers  She can use OTC lubricating drops for eyes       Relevant Medications   nystatin (MYCOSTATIN) 100000 UNIT/ML suspension   Osteoporosis    contiuie fosamax, bone density       Leg swelling    Use lasix prn      Hyperlipidemia   Relevant Orders   Lipid panel (Completed)   Hyperglycemia, drug-induced   Relevant Orders   CBC with Differential/Platelet (Completed)   Comprehensive metabolic panel (Completed)   Hemoglobin A1c (Completed)   GERD (gastroesophageal reflux disease)   COPD (chronic obstructive pulmonary disease) (HCC) - Primary    Severe COPD, chronic prednisone, continues to decline Reviewed pulmonary notes      Relevant Medications   guaiFENesin-codeine 100-10 MG/5ML syrup   Other Relevant Orders   CBC with Differential/Platelet (Completed)   Comprehensive metabolic panel (Completed)  Note: This dictation was prepared with Dragon dictation along with smaller phrase technology. Any transcriptional errors that result from this process are unintentional.

## 2017-04-07 NOTE — Patient Instructions (Addendum)
Get drops for dry eye  Take the lasix Use the thrush nystatin medication Use the cough medicine  We will call with lab reslts  Schedule your mammogram and Bone Density  [place] note  F/U 4 months PHYSICAL

## 2017-04-08 ENCOUNTER — Encounter: Payer: Self-pay | Admitting: Family Medicine

## 2017-04-08 LAB — CBC WITH DIFFERENTIAL/PLATELET
BASOS ABS: 130 {cells}/uL (ref 0–200)
Basophils Relative: 1 %
Eosinophils Absolute: 0 cells/uL — ABNORMAL LOW (ref 15–500)
Eosinophils Relative: 0 %
HCT: 43.7 % (ref 35.0–45.0)
HEMOGLOBIN: 14 g/dL (ref 12.0–15.0)
LYMPHS ABS: 2080 {cells}/uL (ref 850–3900)
Lymphocytes Relative: 16 %
MCH: 31 pg (ref 27.0–33.0)
MCHC: 32 g/dL (ref 32.0–36.0)
MCV: 96.9 fL (ref 80.0–100.0)
MONO ABS: 780 {cells}/uL (ref 200–950)
MPV: 12.1 fL (ref 7.5–12.5)
Monocytes Relative: 6 %
NEUTROS PCT: 77 %
Neutro Abs: 10010 cells/uL — ABNORMAL HIGH (ref 1500–7800)
Platelets: 277 10*3/uL (ref 140–400)
RBC: 4.51 MIL/uL (ref 3.80–5.10)
RDW: 13.1 % (ref 11.0–15.0)
WBC: 13 10*3/uL — ABNORMAL HIGH (ref 3.8–10.8)

## 2017-04-08 LAB — COMPREHENSIVE METABOLIC PANEL
ALBUMIN: 4.1 g/dL (ref 3.6–5.1)
ALK PHOS: 56 U/L (ref 33–130)
ALT: 21 U/L (ref 6–29)
AST: 19 U/L (ref 10–35)
BILIRUBIN TOTAL: 0.2 mg/dL (ref 0.2–1.2)
BUN: 10 mg/dL (ref 7–25)
CO2: 36 mmol/L — ABNORMAL HIGH (ref 20–31)
CREATININE: 0.9 mg/dL (ref 0.50–0.99)
Calcium: 10.3 mg/dL (ref 8.6–10.4)
Chloride: 100 mmol/L (ref 98–110)
Glucose, Bld: 122 mg/dL — ABNORMAL HIGH (ref 70–99)
Potassium: 5.1 mmol/L (ref 3.5–5.3)
Sodium: 144 mmol/L (ref 135–146)
TOTAL PROTEIN: 6.3 g/dL (ref 6.1–8.1)

## 2017-04-08 LAB — LIPID PANEL
CHOL/HDL RATIO: 3.1 ratio (ref <5.0)
Cholesterol: 206 mg/dL — ABNORMAL HIGH (ref <200)
HDL: 67 mg/dL (ref >50)
LDL Cholesterol: 114 mg/dL — ABNORMAL HIGH (ref <100)
Triglycerides: 124 mg/dL (ref <150)
VLDL: 25 mg/dL (ref <30)

## 2017-04-08 LAB — HEMOGLOBIN A1C
HEMOGLOBIN A1C: 5.7 % — AB (ref <5.7)
MEAN PLASMA GLUCOSE: 117 mg/dL

## 2017-04-08 NOTE — Assessment & Plan Note (Signed)
Use lasix prn

## 2017-04-08 NOTE — Assessment & Plan Note (Signed)
contiuie fosamax, bone density

## 2017-04-08 NOTE — Assessment & Plan Note (Signed)
Severe COPD, chronic prednisone, continues to decline Reviewed pulmonary notes

## 2017-04-08 NOTE — Assessment & Plan Note (Signed)
Continues to smoke.

## 2017-04-08 NOTE — Assessment & Plan Note (Signed)
Nystatin given, due to chronic prednisone and inhalers  She can use OTC lubricating drops for eyes

## 2017-04-09 ENCOUNTER — Other Ambulatory Visit: Payer: Self-pay | Admitting: *Deleted

## 2017-04-09 ENCOUNTER — Encounter: Payer: Self-pay | Admitting: Family Medicine

## 2017-04-09 ENCOUNTER — Other Ambulatory Visit: Payer: Self-pay | Admitting: Family Medicine

## 2017-04-09 DIAGNOSIS — M810 Age-related osteoporosis without current pathological fracture: Secondary | ICD-10-CM

## 2017-04-09 MED ORDER — SIMVASTATIN 40 MG PO TABS: 40.0000 mg | ORAL_TABLET | Freq: Every day | ORAL | 3 refills | Status: DC

## 2017-04-12 ENCOUNTER — Emergency Department (HOSPITAL_COMMUNITY): Payer: Medicare Other

## 2017-04-12 ENCOUNTER — Emergency Department (HOSPITAL_COMMUNITY)
Admission: EM | Admit: 2017-04-12 | Discharge: 2017-04-13 | Disposition: A | Discharge status: Home / Self Care | Payer: Medicare Other | Attending: Emergency Medicine | Admitting: Emergency Medicine

## 2017-04-12 ENCOUNTER — Encounter (HOSPITAL_COMMUNITY): Payer: Self-pay | Admitting: *Deleted

## 2017-04-12 DIAGNOSIS — Z7982 Long term (current) use of aspirin: Secondary | ICD-10-CM | POA: Diagnosis not present

## 2017-04-12 DIAGNOSIS — I10 Essential (primary) hypertension: Secondary | ICD-10-CM | POA: Diagnosis not present

## 2017-04-12 DIAGNOSIS — R0602 Shortness of breath: Secondary | ICD-10-CM | POA: Insufficient documentation

## 2017-04-12 DIAGNOSIS — Z79899 Other long term (current) drug therapy: Secondary | ICD-10-CM | POA: Diagnosis not present

## 2017-04-12 DIAGNOSIS — F17210 Nicotine dependence, cigarettes, uncomplicated: Secondary | ICD-10-CM | POA: Insufficient documentation

## 2017-04-12 DIAGNOSIS — J441 Chronic obstructive pulmonary disease with (acute) exacerbation: Secondary | ICD-10-CM | POA: Diagnosis not present

## 2017-04-12 DIAGNOSIS — R0682 Tachypnea, not elsewhere classified: Secondary | ICD-10-CM | POA: Diagnosis not present

## 2017-04-12 DIAGNOSIS — R000 Tachycardia, unspecified: Secondary | ICD-10-CM | POA: Diagnosis present

## 2017-04-12 LAB — BASIC METABOLIC PANEL
ANION GAP: 9 (ref 5–15)
BUN: 9 mg/dL (ref 6–20)
CHLORIDE: 95 mmol/L — AB (ref 101–111)
CO2: 34 mmol/L — AB (ref 22–32)
Calcium: 9.7 mg/dL (ref 8.9–10.3)
Creatinine, Ser: 0.77 mg/dL (ref 0.44–1.00)
Glucose, Bld: 101 mg/dL — ABNORMAL HIGH (ref 65–99)
POTASSIUM: 3.5 mmol/L (ref 3.5–5.1)
Sodium: 138 mmol/L (ref 135–145)

## 2017-04-12 LAB — CBC WITH DIFFERENTIAL/PLATELET
Basophils Absolute: 0.1 10*3/uL (ref 0.0–0.1)
Basophils Relative: 0 %
EOS PCT: 0 %
Eosinophils Absolute: 0.1 10*3/uL (ref 0.0–0.7)
HEMATOCRIT: 43.9 % (ref 36.0–46.0)
Hemoglobin: 14.2 g/dL (ref 12.0–15.0)
Lymphocytes Relative: 26 %
Lymphs Abs: 4.3 10*3/uL — ABNORMAL HIGH (ref 0.7–4.0)
MCH: 31.5 pg (ref 26.0–34.0)
MCHC: 32.3 g/dL (ref 30.0–36.0)
MCV: 97.3 fL (ref 78.0–100.0)
Monocytes Absolute: 1.5 10*3/uL — ABNORMAL HIGH (ref 0.1–1.0)
Monocytes Relative: 9 %
NEUTROS ABS: 10.8 10*3/uL — AB (ref 1.7–7.7)
NEUTROS PCT: 65 %
PLATELETS: 246 10*3/uL (ref 150–400)
RBC: 4.51 MIL/uL (ref 3.87–5.11)
RDW: 13.4 % (ref 11.5–15.5)
WBC: 16.7 10*3/uL — AB (ref 4.0–10.5)

## 2017-04-12 LAB — TROPONIN I: Troponin I: <0.03 ng/mL (ref <0.03)

## 2017-04-12 LAB — BRAIN NATRIURETIC PEPTIDE: B Natriuretic Peptide: 317 pg/mL — ABNORMAL HIGH (ref 0.0–100.0)

## 2017-04-12 LAB — MAGNESIUM: Magnesium: 2 mg/dL (ref 1.7–2.4)

## 2017-04-12 MED ORDER — IPRATROPIUM-ALBUTEROL 0.5-2.5 (3) MG/3ML IN SOLN
3.0000 mL | Freq: Once | RESPIRATORY_TRACT | Status: AC
  Administered 2017-04-12: 3 mL via RESPIRATORY_TRACT
  Filled 2017-04-12: qty 3

## 2017-04-12 MED ORDER — METHYLPREDNISOLONE SODIUM SUCC 125 MG IJ SOLR
125.0000 mg | Freq: Once | INTRAMUSCULAR | Status: AC
  Administered 2017-04-12: 125 mg via INTRAVENOUS
  Filled 2017-04-12: qty 2
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
## 2017-04-13 LAB — TROPONIN I

## 2017-04-13 MED ORDER — PREDNISONE 10 MG (21) PO TBPK: ORAL_TABLET | ORAL | 0 refills | Status: DC

## 2017-04-13 NOTE — Discharge Instructions (Signed)
Increase your steroid dose as prescribed. Then return back to your usual dose. Continue your current medications and follow up with your doctor next week

## 2017-04-13 NOTE — ED Notes (Signed)
Pt alert & oriented x4, stable gait. Patient given discharge instructions, paperwork & prescription(s).  Patient verbalized understanding. Pt left department in wheelchair. Pt left  w/ no further questions. 

## 2017-04-15 ENCOUNTER — Other Ambulatory Visit (HOSPITAL_COMMUNITY): Payer: Medicare Other

## 2017-04-16 ENCOUNTER — Other Ambulatory Visit: Payer: Self-pay | Admitting: Family Medicine

## 2017-04-16 DIAGNOSIS — Z1231 Encounter for screening mammogram for malignant neoplasm of breast: Secondary | ICD-10-CM

## 2017-04-22 ENCOUNTER — Ambulatory Visit (HOSPITAL_COMMUNITY)
Admission: RE | Admit: 2017-04-22 | Discharge: 2017-04-22 | Disposition: A | Discharge status: Home / Self Care | Payer: Medicare Other | Source: Ambulatory Visit | Attending: Family Medicine | Admitting: Family Medicine

## 2017-04-22 ENCOUNTER — Encounter: Payer: Self-pay | Admitting: *Deleted

## 2017-04-22 DIAGNOSIS — Z1231 Encounter for screening mammogram for malignant neoplasm of breast: Secondary | ICD-10-CM | POA: Diagnosis not present

## 2017-04-22 DIAGNOSIS — M8589 Other specified disorders of bone density and structure, multiple sites: Secondary | ICD-10-CM | POA: Insufficient documentation

## 2017-04-22 DIAGNOSIS — M85852 Other specified disorders of bone density and structure, left thigh: Secondary | ICD-10-CM | POA: Diagnosis not present

## 2017-04-22 DIAGNOSIS — M810 Age-related osteoporosis without current pathological fracture: Secondary | ICD-10-CM

## 2017-04-29 ENCOUNTER — Telehealth: Payer: Self-pay | Admitting: Emergency Medicine

## 2017-04-29 ENCOUNTER — Other Ambulatory Visit: Payer: Self-pay | Admitting: Emergency Medicine

## 2017-04-29 MED ORDER — ALBUTEROL SULFATE HFA 108 (90 BASE) MCG/ACT IN AERS: 2.0000 | INHALATION_SPRAY | Freq: Four times a day (QID) | RESPIRATORY_TRACT | 11 refills | Status: DC | PRN

## 2017-04-29 NOTE — Telephone Encounter (Signed)
Pt needs refill on albuterol. This has been sent. Nothing further needed.

## 2017-05-06 ENCOUNTER — Other Ambulatory Visit: Payer: Self-pay | Admitting: Family Medicine

## 2017-05-11 ENCOUNTER — Other Ambulatory Visit: Payer: Self-pay | Admitting: Family Medicine

## 2017-06-11 ENCOUNTER — Ambulatory Visit (HOSPITAL_COMMUNITY)
Admission: RE | Admit: 2017-06-11 | Discharge: 2017-06-11 | Disposition: A | Discharge status: Home / Self Care | Payer: Medicare Other | Source: Ambulatory Visit | Attending: Urology | Admitting: Urology

## 2017-06-11 ENCOUNTER — Other Ambulatory Visit: Payer: Self-pay | Admitting: Family Medicine

## 2017-06-11 ENCOUNTER — Other Ambulatory Visit: Payer: Self-pay | Admitting: Urology

## 2017-06-11 DIAGNOSIS — N200 Calculus of kidney: Secondary | ICD-10-CM | POA: Diagnosis not present

## 2017-06-22 ENCOUNTER — Encounter: Payer: Self-pay | Admitting: Family Medicine

## 2017-06-22 ENCOUNTER — Telehealth: Payer: Self-pay | Admitting: Family Medicine

## 2017-06-22 ENCOUNTER — Ambulatory Visit (INDEPENDENT_AMBULATORY_CARE_PROVIDER_SITE_OTHER): Payer: Medicare Other | Admitting: Family Medicine

## 2017-06-22 VITALS — BP 138/78 | HR 104 | Temp 98.0°F | Resp 22 | Ht 61.0 in | Wt 188.0 lb

## 2017-06-22 DIAGNOSIS — J441 Chronic obstructive pulmonary disease with (acute) exacerbation: Secondary | ICD-10-CM

## 2017-06-22 DIAGNOSIS — J9611 Chronic respiratory failure with hypoxia: Secondary | ICD-10-CM | POA: Diagnosis not present

## 2017-06-22 DIAGNOSIS — M7989 Other specified soft tissue disorders: Secondary | ICD-10-CM | POA: Diagnosis not present

## 2017-06-22 DIAGNOSIS — Z720 Tobacco use: Secondary | ICD-10-CM | POA: Diagnosis not present

## 2017-06-22 DIAGNOSIS — E7800 Pure hypercholesterolemia, unspecified: Secondary | ICD-10-CM

## 2017-06-22 MED ORDER — IPRATROPIUM-ALBUTEROL 0.5-2.5 (3) MG/3ML IN SOLN
3.0000 mL | Freq: Once | RESPIRATORY_TRACT | Status: AC
  Administered 2017-06-22: 3 mL via RESPIRATORY_TRACT

## 2017-06-22 MED ORDER — PREDNISONE 20 MG PO TABS: ORAL_TABLET | ORAL | 0 refills | Status: DC

## 2017-06-22 MED ORDER — OXYCODONE-ACETAMINOPHEN 5-325 MG PO TABS: ORAL_TABLET | ORAL | 0 refills | Status: DC

## 2017-06-22 MED ORDER — GUAIFENESIN-CODEINE 100-10 MG/5ML PO SYRP: ORAL_SOLUTION | ORAL | 0 refills | Status: DC

## 2017-06-22 MED ORDER — AZITHROMYCIN 500 MG PO TABS
500.0000 mg | ORAL_TABLET | Freq: Every day | ORAL | 0 refills | Status: DC
Start: 2017-06-22 — End: 2017-08-07

## 2017-06-22 MED ORDER — METHYLPREDNISOLONE ACETATE 80 MG/ML IJ SUSP
80.0000 mg | Freq: Once | INTRAMUSCULAR | Status: AC
  Administered 2017-06-22: 80 mg via INTRAMUSCULAR

## 2017-06-22 NOTE — Progress Notes (Addendum)
   Subjective:    Patient ID: Cindy [name], female    DOB: 02/02/1956, 61 y.o.   MRN: [id number]  Patient presents for COPD Exacerbation   Pt here for COPD, has Severe COPD , maxed on medication, on chronic prednisone and oxygen therapy   Last treated for COPD exacerbation in April,CXR was negative, she was given prednisone taper   Last seen by pulmonary in March  Symptoms started last Friday, has been using her nebulizer without improvementHe is not had any fever. This appears to be her typical COPD exacerbation. She does continue to smoke. She was on multiple tangents about 1 and stop her cholesterol medicine because she felt like she was breaking off and a rash on her chest and sometimes she gets a tightness in her jaw. She also states that she feels swelling in her feet when she gets up on them she's been taking Lasix does not feel like it is helping.  Has a DMV form that she needs filled out. She feels her simvastatin Review Of Systems:  GEN- denies fatigue, fever, weight loss,weakness, recent illness HEENT- denies eye drainage, change in vision, nasal discharge, CVS- denies chest pain, palpitations RESP- + SOB,+ cough, +wheeze ABD- denies N/V, change in stools, abd pain GU- denies dysuria, hematuria, dribbling, incontinence MSK- denies joint pain, muscle aches, injury Neuro- denies headache, dizziness, syncope, seizure activity       Objective:    BP 138/78   Pulse (!) 104   Temp 98 F (36.7 C) (Oral)   Resp (!) 22   Ht 5\' 1"  (1.549 m)   Wt 188 lb (85.3 kg)   SpO2 93% Comment: 2.5L/ min via [place]  BMI 35.52 kg/m  GEN- Mild respiratory distress, oxygen on 2.5L , alert and oriented x3 HEENT- PERRL, EOMI, non injected sclera, pink conjunctiva, MMM, oropharynx clear Neck- Supple, no LAD, mild retractions  CVS- mild tachycardia HR 100  no murmur RESP-rhonchi bilat, fair air movement, wheeze bilat ,incrased WOB with talking  ABD-NABS,soft,NT,ND, large pannus EXT- trace pedal   edema Pulses- Radial, DP- 2+  S/P neb- improved WOB, continued wheeze and rhonchi      Assessment & Plan:      Discussed recheck Wed, if not improved or she can go directly to ER, she states she has pulmonary visit on Friday as well. If she is feeling better, she can wait and see pulmonary on Friday  Problem List Items Addressed This Visit    Chronic hypoxemic respiratory failure ([place]) - Primary   Tobacco abuse (Chronic)   Leg swelling    Minimal swelling, advised she can go up to 40mg  She did have Echo done in 2017 no heart failure, non specific grade 1 diastolic dysfunction        Hyperlipidemia    She thinks cholesterol causing symptoms, though no rash sseen Will have her stop for 1 month, we have F/U visit in August       COPD exacerbation ([place])    Treat with depo Medrol 80mg  IM given in office Duoneb in office given Prednisone taper then backto her 10mg  Continue her inhalers Use neb every 4 hours Azithromycin 500mg  x 5 days given          Note: This dictation was prepared with Dragon dictation along with smaller phrase technology. Any transcriptional errors that result from this process are unintentional.

## 2017-06-22 NOTE — Telephone Encounter (Signed)
Patient is requesting refill on robitussin AC.   MD please advise.

## 2017-06-22 NOTE — Telephone Encounter (Signed)
Patient says that she does need refill on her cough med that starts with a "G", but does not know the name of it  She forgot to tell the doctor

## 2017-06-22 NOTE — Telephone Encounter (Signed)
Okay to refill? 

## 2017-06-22 NOTE — Assessment & Plan Note (Signed)
Minimal swelling, advised she can go up to 40mg  She did have Echo done in 2017 no heart failure, non specific grade 1 diastolic dysfunction

## 2017-06-22 NOTE — Assessment & Plan Note (Signed)
She thinks cholesterol causing symptoms, though no rash sseen Will have her stop for 1 month, we have F/U visit in August

## 2017-06-22 NOTE — Assessment & Plan Note (Signed)
Treat with depo Medrol 80mg  IM given in office Duoneb in office given Prednisone taper then backto her 10mg  Continue her inhalers Use neb every 4 hours Azithromycin 500mg  x 5 days given

## 2017-06-22 NOTE — Patient Instructions (Addendum)
Stop the simvastatin for 1 month Increase lasix to 40mg   F/U Wed for recheck if not better- you can call us

## 2017-06-22 NOTE — Telephone Encounter (Signed)
Medication called to pharmacy. 

## 2017-06-22 NOTE — Addendum Note (Signed)
Addended by: Sheral Flow on: 06/22/2017 11:55 AM   Modules accepted: Orders

## 2017-06-23 ENCOUNTER — Ambulatory Visit (INDEPENDENT_AMBULATORY_CARE_PROVIDER_SITE_OTHER): Payer: Medicare Other | Admitting: Urology

## 2017-06-23 DIAGNOSIS — N200 Calculus of kidney: Secondary | ICD-10-CM | POA: Diagnosis not present

## 2017-06-24 ENCOUNTER — Telehealth: Payer: Self-pay | Admitting: Family Medicine

## 2017-06-24 NOTE — Telephone Encounter (Signed)
Call placed to pharmacy.   Prescription is available for pick up.

## 2017-06-24 NOTE — Telephone Encounter (Signed)
Pt called saying [facility] did not have her cough syrup please call her back.

## 2017-06-26 ENCOUNTER — Encounter: Payer: Self-pay | Admitting: Emergency Medicine

## 2017-06-26 ENCOUNTER — Ambulatory Visit (INDEPENDENT_AMBULATORY_CARE_PROVIDER_SITE_OTHER): Payer: Medicare Other | Admitting: Emergency Medicine

## 2017-06-26 VITALS — BP 126/80 | HR 111 | Ht 61.0 in | Wt 188.2 lb

## 2017-06-26 DIAGNOSIS — J411 Mucopurulent chronic bronchitis: Secondary | ICD-10-CM

## 2017-06-26 NOTE — Patient Instructions (Signed)
Please continue your prednisone taper back down to 10 mg daily Finish your azithromycin as planned Continue Symbicort and Spiriva Continue albuterol as needed Work on stopping smoking completely We will check with [employer] to see if it is possible for you to change to a portable oxygen concentrator. May require a transition from continuous-flow oxygen to pulsed-flow oxygen. We will have [employer] see if you can tolerate this change before changing your system. Follow with Dr Lamonte Sakai in 3 months or sooner if you have any problems.

## 2017-06-26 NOTE — Progress Notes (Signed)
Subjective:    Patient ID: Cindy [name], female    DOB: [birth year], 61 y.o.   MRN: [id number]  HPI 61 yo woman, smoker (40pk-yrs), hyperlipidemia. Followed by Dr Buelah Manis and on Spiriva + Symbicort + ProAir prn, FEV1 19% predicted on prior PFT! She has had several exacerbations over the last year. Was first noted to be hypoxemic in 2010, is on O2 that she is using prn.  Chronic prednisone > 10mg  daily   ROV 05/01/16 -- follow-up visit for history of COPD that is oxygen and prednisone dependent, 10mg . She continues to smoke. She is currently on Spiriva and Symbicort. Her most recent exacerbation was in late February - was hospitalized and treated w steroids, abx. Did not require BiPAP. She has been having increased LE edema over the last few months, a TTE was ordered but has not been scheduled. She was started on lasix last month. Her exercise tolerance is poor. Wearing 2L/min but not at all times.   ROV 09/02/16 -- Patient has a history of severe COPD, chronic hypoxemic respiratory failure, chronic prednisone use. She continues to smoke, about 1 pack a day. Maintenance bronchodilators include Spiriva and Symbicort. Her O2 is at She has had 2 exacerbations since last visit. She was just seen by Dr. Dennard Schaumann 08/22/16 and given Depo-Medrol. She was also started on Augmentin. Overall she believes that she is doing worse. She is having LE edema, is on lasix. She just completed a short pred taper. She has gained weight  ROV 12/11/16 -- Follow-up visit for tobacco abuse, severe COPD, chronic hypoxemic respiratory failure. She has been managed on Spiriva, Symbicort, chronic daily prednisone 10mg . She was treated for another AE-COPD 11/18/16 by Dr Buelah Manis. Darrick Meigs + pred taper. She is having multiple exacerbations a year - probably 8-10 in the last year. Continues to have increased secxretions and purulent cough  ROV 03/26/17 -- Patient has a history of continued tobacco use, severe COPD, chronic hypoxemic respiratory  failure with frequent exacerbations. She is on pred 10mg  daily, daliresp. BD's include Spiriva and symbicort. Her last abx and pred taper was January 2018.  She describes more LE edema. She has noticed exertional desats and then tachycardia. She is wearing 2-2.5L/min O2.  She is having some cough but no increase from her baseline. I discussed the need to treat her with prednisone taper - she wants to avoid this, wants a depo-medrol shot instead.   ROV 06/26/17 -- Cindy [name] his severe COPD with persistent symptoms in the setting of continued tobacco abuse. She has chronic hypoxemic respiratory failure with very frequent exacerbations. She has been in the emergency rooms since our last visit. She was also seen by Dr. Buelah Manis on 6/25. She was treated with depomedrol, pred taper back down to 10mg . Azithromycin. She remains on symbicort and spiriva. Using albuterol prn, about once a day. Her oxygen is at 3L/min. She is smoking  . She is not on flonase right now, uses prn. She is interested in a POC   PULMONARY FUNCTON TEST 09/27/2012  FVC 2.31  FEV1 .87  FEV1/FVC 37.7  FVC  % Predicted 76  FEV % Predicted 38  FeF 25-75 .3  FeF 25-75 % Predicted 2.65       Objective:   Physical Exam Vitals:   06/26/17 1046  BP: 126/80  Pulse: (!) 111  SpO2: 94%  Weight: 188 lb 3.2 oz (85.4 kg)  Height: 5\' 1"  (1.549 m)   Gen: Pleasant, well-nourished, in no distress,  ENT: No lesions,  mouth clear,  oropharynx clear, no postnasal drip  Neck: No JVD, no TMG, no carotid bruits, no stridor  Lungs: No use of accessory muscles, B soft exp wheezes  Cardiovascular: RRR, heart sounds normal, no murmur or gallops, no peripheral edema  Musculoskeletal: No deformities, no cyanosis or clubbing  Neuro: alert, non focal  Skin: Warm, no lesions or rashes     Assessment & Plan:  COPD (chronic obstructive pulmonary disease) Please continue your prednisone taper back down to 10 mg daily Finish your azithromycin  as planned Continue Symbicort and Spiriva Continue albuterol as needed Work on stopping smoking completely We will check with [employer] to see if it is possible for you to change to a portable oxygen concentrator. May require a transition from continuous-flow oxygen to pulsed-flow oxygen. We will have [employer] see if you can tolerate this change before changing your system. Follow with Dr Lamonte Sakai in 3 months or sooner if you have any problems.  Baltazar Apo, MD, PhD 06/26/2017, 11:12 AM [facility] [phone] or if no answer [phone]

## 2017-06-26 NOTE — Assessment & Plan Note (Signed)
Please continue your prednisone taper back down to 10 mg daily Finish your azithromycin as planned Continue Symbicort and Spiriva Continue albuterol as needed Work on stopping smoking completely We will check with [employer] to see if it is possible for you to change to a portable oxygen concentrator. May require a transition from continuous-flow oxygen to pulsed-flow oxygen. We will have [employer] see if you can tolerate this change before changing your system. Follow with Dr Lamonte Sakai in 3 months or sooner if you have any problems.

## 2017-07-06 ENCOUNTER — Other Ambulatory Visit: Payer: Self-pay | Admitting: Emergency Medicine

## 2017-08-07 ENCOUNTER — Ambulatory Visit (INDEPENDENT_AMBULATORY_CARE_PROVIDER_SITE_OTHER): Payer: Medicare Other | Admitting: Family Medicine

## 2017-08-07 ENCOUNTER — Encounter: Payer: Self-pay | Admitting: Family Medicine

## 2017-08-07 VITALS — BP 128/68 | HR 112 | Temp 98.7°F | Resp 18 | Ht 61.0 in | Wt 193.0 lb

## 2017-08-07 DIAGNOSIS — J411 Mucopurulent chronic bronchitis: Secondary | ICD-10-CM

## 2017-08-07 DIAGNOSIS — J9611 Chronic respiratory failure with hypoxia: Secondary | ICD-10-CM

## 2017-08-07 DIAGNOSIS — K219 Gastro-esophageal reflux disease without esophagitis: Secondary | ICD-10-CM | POA: Diagnosis not present

## 2017-08-07 DIAGNOSIS — B370 Candidal stomatitis: Secondary | ICD-10-CM | POA: Diagnosis not present

## 2017-08-07 DIAGNOSIS — Z720 Tobacco use: Secondary | ICD-10-CM | POA: Diagnosis not present

## 2017-08-07 DIAGNOSIS — H04123 Dry eye syndrome of bilateral lacrimal glands: Secondary | ICD-10-CM | POA: Diagnosis not present

## 2017-08-07 MED ORDER — NYSTATIN 100000 UNIT/ML MT SUSP: 5.0000 mL | Freq: Four times a day (QID) | OROMUCOSAL | 3 refills | Status: DC

## 2017-08-07 MED ORDER — GUAIFENESIN-CODEINE 100-10 MG/5ML PO SYRP: ORAL_SOLUTION | ORAL | 0 refills | Status: DC

## 2017-08-07 MED ORDER — PREDNISONE 20 MG PO TABS: ORAL_TABLET | ORAL | 0 refills | Status: DC

## 2017-08-07 MED ORDER — IPRATROPIUM-ALBUTEROL 0.5-2.5 (3) MG/3ML IN SOLN
3.0000 mL | Freq: Once | RESPIRATORY_TRACT | Status: AC
  Administered 2017-08-07: 3 mL via RESPIRATORY_TRACT

## 2017-08-07 MED ORDER — AZITHROMYCIN 500 MG PO TABS: 500.0000 mg | ORAL_TABLET | Freq: Every day | ORAL | 0 refills | Status: DC

## 2017-08-07 MED ORDER — FLUCONAZOLE 150 MG PO TABS: ORAL_TABLET | ORAL | 1 refills | Status: DC

## 2017-08-07 NOTE — Patient Instructions (Addendum)
Try the Trelegy - it is 1 puff daily - YOU TAKE THIS IN PLACE OF SPIRIVA AND SYMBICORT Take antibiotics first Prednisone Take yeast pill AFTER antibiotics F/U   Oct for Physical

## 2017-08-07 NOTE — Progress Notes (Signed)
   Subjective:    Patient ID: Cindy [name], female    DOB: [birth year], 61 y.o.   MRN: [id number]  Patient presents for Follow-up (is fasting)   COPD follows with pulmonary, having increased number of exacerbations about every other month now. Has had syptoms with cough, SOB, production, no fever for past 2 weeks. Feels nebs are not working as well.  Needs refill on cough meds   Eyes are getting gritty, and red at times, using some eye drops OTC which help some  Feels like something catches in her throat after using her inhalers, other times feels reflux . No diffivultuy swallowing food. Still has some thrush      Review Of Systems:  GEN- denies fatigue, fever, weight loss,weakness, recent illness HEENT- denies eye drainage, change in vision, nasal discharge, CVS- denies chest pain, palpitations RESP- + SOB,+ cough, +wheeze ABD- denies N/V, change in stools, abd pain GU- denies dysuria, hematuria, dribbling, incontinence MSK- +joint pain, muscle aches, injury Neuro- denies headache, dizziness, syncope, seizure activity       Objective:    BP 128/68   Pulse (!) 112   Temp 98.7 F (37.1 C) (Oral)   Resp 18   Ht 5\' 1"  (1.549 m)   Wt 193 lb (87.5 kg)   SpO2 97% Comment: 2.5L/min via [place]  BMI 36.47 kg/m  GEN- NAD, alert and oriented x3 HEENT- PERRL, EOMI, non injected sclera, pink conjunctiva, MMM, oropharynx thrush Neck- Supple, no LAD, no thyromegaly CVS- mild tachycardia after nebs, no murmur RESP-Bilat wheeze, increased WOB , rhonchi bilat  ABD-NABS,soft,NT,ND EXT- trace ankle edema Pulses- Radial  2+  Duo neb given in office, improved WOB      Assessment & Plan:      Problem List Items Addressed This Visit    GERD (gastroesophageal reflux disease)   Chronic hypoxemic respiratory failure (HCC) - Primary   Relevant Medications   ipratropium-albuterol (DUONEB) 0.5-2.5 (3) MG/3ML nebulizer solution 3 mL (Completed)   Tobacco abuse (Chronic)   Thrush   Concern she may have esophogeal thrush, given diflucan by mouth as well, continue PPI  She may benefit from EGD, difficult to discern the sensation she is getting with swallowing She wants to hold off right now       Relevant Medications   nystatin (MYCOSTATIN) 100000 UNIT/ML suspension   fluconazole (DIFLUCAN) 150 MG tablet   azithromycin (ZITHROMAX) 500 MG tablet   COPD (chronic obstructive pulmonary disease) (HCC)    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 Currently with exaerbation, she is on chronic prednisone Will give higher dose taper starting at 60mg  Given azithromycin Will let her try Trelegy see if she sees a difference or not,  She is following with pulmonary seems to have exhausted all other means and continues to smoke      Relevant Medications   ipratropium-albuterol (DUONEB) 0.5-2.5 (3) MG/3ML nebulizer solution 3 mL (Completed)   guaiFENesin-codeine (GUAIFENESIN AC) 100-10 MG/5ML syrup   predniSONE (DELTASONE) 20 MG tablet   azithromycin (ZITHROMAX) 500 MG tablet    Other Visit Diagnoses    Dry eyes       From oxygen blowing, use artifical tears, offered eye appt she declined      Note: This dictation was prepared with Dragon dictation along with smaller phrase technology. Any transcriptional errors that result from this process are unintentional.

## 2017-08-08 NOTE — Assessment & Plan Note (Signed)
Concern she may have esophogeal thrush, given diflucan by mouth as well, continue PPI  She may benefit from EGD, difficult to discern the sensation she is getting with swallowing She wants to hold off right now

## 2017-08-08 NOTE — Assessment & Plan Note (Signed)
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 Currently with exaerbation, she is on chronic prednisone Will give higher dose taper starting at 60mg  Given azithromycin Will let her try Trelegy see if she sees a difference or not,  She is following with pulmonary seems to have exhausted all other means and continues to smoke

## 2017-09-17 ENCOUNTER — Ambulatory Visit (INDEPENDENT_AMBULATORY_CARE_PROVIDER_SITE_OTHER): Payer: Medicare Other | Admitting: Physician Assistant

## 2017-09-17 ENCOUNTER — Encounter: Payer: Self-pay | Admitting: Physician Assistant

## 2017-09-17 VITALS — BP 148/88 | HR 92 | Temp 98.1°F | Resp 18 | Wt 195.2 lb

## 2017-09-17 DIAGNOSIS — J441 Chronic obstructive pulmonary disease with (acute) exacerbation: Secondary | ICD-10-CM | POA: Diagnosis not present

## 2017-09-17 DIAGNOSIS — J9611 Chronic respiratory failure with hypoxia: Secondary | ICD-10-CM

## 2017-09-17 DIAGNOSIS — M545 Low back pain, unspecified: Secondary | ICD-10-CM

## 2017-09-17 MED ORDER — OXYCODONE-ACETAMINOPHEN 5-325 MG PO TABS: ORAL_TABLET | ORAL | 0 refills | Status: DC

## 2017-09-17 MED ORDER — IPRATROPIUM-ALBUTEROL 0.5-2.5 (3) MG/3ML IN SOLN
3.0000 mL | Freq: Once | RESPIRATORY_TRACT | Status: AC
  Administered 2017-09-17: 3 mL via RESPIRATORY_TRACT

## 2017-09-17 MED ORDER — HYDROCODONE-HOMATROPINE 5-1.5 MG/5ML PO SYRP: 5.0000 mL | ORAL_SOLUTION | Freq: Four times a day (QID) | ORAL | 0 refills | Status: DC | PRN

## 2017-09-17 MED ORDER — METHYLPREDNISOLONE ACETATE 80 MG/ML IJ SUSP
80.0000 mg | Freq: Once | INTRAMUSCULAR | Status: AC
  Administered 2017-09-17: 80 mg via INTRAMUSCULAR

## 2017-09-17 MED ORDER — PREDNISONE 20 MG PO TABS: ORAL_TABLET | ORAL | 0 refills | Status: DC

## 2017-09-17 MED ORDER — AMOXICILLIN-POT CLAVULANATE 875-125 MG PO TABS: 1.0000 | ORAL_TABLET | Freq: Two times a day (BID) | ORAL | 0 refills | Status: DC

## 2017-09-17 NOTE — Progress Notes (Signed)
Patient ID: Cindy [name] MRN: [id number], DOB: August 26, 1956, 61 y.o. Date of Encounter: @DATE @  Chief Complaint:  Chief Complaint  Patient presents with  . Shortness of Breath    x1week  . left foot swollen    HPI: 61 y.o. year old female  presents with above.   I reviewed her office note from pulmonary dated 06/26/17. Also reviewed her note by Dr. Buelah Manis from 08/07/17.  She has severe COPD, chronic hypoxemic respiratory failure, chronic prednisone use. Continues to smoke.  She is on nasal cannula oxygen, Spiriva, Symbicort, pro-air prn.  She has history of frequent COPD exacerbations and presents with symptoms consistent with her usual COPD exacerbations. Reports that she is having increased wheezing and shortness of breath "and can't walk across the room because gets out of breath."  Also has noticed a little bit of swelling in her foot and is wondering if she should take her fluid pill. Says that she does not take this on a daily basis and just has this available to use as needed for swelling and wasn't sure she needed to take this or not.  No other concerns to address today.   Past Medical History:  Diagnosis Date  . 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 Arthritis   . Chronic respiratory failure ([place]) On home 02 2-3L  . Compression fracture of lumbar vertebra ([place])   . COPD (chronic obstructive pulmonary disease) ([place])   . Hyperglycemia, drug-induced   . Hyperlipidemia   . Kidney stones 2014   Left side, multiple  . Osteoporosis 2013  . Shortness of breath   . Thyroid disease    pt denies  . Tobacco abuse      Home Meds: Outpatient Medications Prior to Visit  Medication Sig Dispense Refill  . albuterol (PROVENTIL HFA;VENTOLIN HFA) 108 (90 Base) MCG/ACT inhaler Inhale 2 puffs into the lungs every 6 (six) hours as needed for wheezing or shortness of breath. 1 Inhaler 11  . albuterol (PROVENTIL) (2.5 MG/3ML) 0.083% nebulizer solution Take 3 mLs (2.5 mg total) by  nebulization every 4 (four) hours as needed for wheezing or shortness of breath. 75 mL 11  . alendronate (FOSAMAX) 70 MG tablet take 1 tablet by mouth every week on an empty stomach with 6 4 tablet 6  . aspirin EC 81 MG tablet Take 81 mg by mouth every morning.     . Calcium Carb-Cholecalciferol 600-800 MG-UNIT TABS Take 1 tablet by mouth daily.    . fluticasone (FLONASE) 50 MCG/ACT nasal spray Place 1 spray into both nostrils daily as needed for allergies or rhinitis.    . furosemide (LASIX) 20 MG tablet take 1 tablet by mouth once daily 30 tablet 3  . nystatin (MYCOSTATIN) 100000 UNIT/ML suspension Use as directed 5 mLs (500,000 Units total) in the mouth or throat 4 (four) times daily. 240 mL 3  . OXYGEN Inhale 2.5 L into the lungs continuous.     . pantoprazole (PROTONIX) 40 MG tablet take 1 tablet by mouth once daily (Patient taking differently: take 1 tablet by mouth once daily in the evening) 90 tablet 1  . predniSONE (DELTASONE) 10 MG tablet take 1 tablet every morning with food 30 tablet 5  . simvastatin (ZOCOR) 40 MG tablet Take 1 tablet (40 mg total) by mouth at bedtime. 90 tablet 3  . SPIRIVA RESPIMAT 2.5 MCG/ACT AERS inhale 2 sprays INTO THE LUNGS once daily 4 g 4  . SYMBICORT 160-4.5 MCG/ACT inhaler inhale  2 puffs by mouth twice a day 10.2 g 11  . oxyCODONE-acetaminophen (PERCOCET/ROXICET) 5-325 MG tablet take 1 tablet by mouth every 8 hours if needed for pain 30 tablet 0  . azithromycin (ZITHROMAX) 500 MG tablet Take 1 tablet (500 mg total) by mouth daily. For 5 days 5 tablet 0  . fluconazole (DIFLUCAN) 150 MG tablet Take 1 tablet and repeat in 3 days 2 tablet 1  . guaiFENesin-codeine (GUAIFENESIN AC) 100-10 MG/5ML syrup take 5 milliliters (1 teaspoonful) by mouth every 6 hours if needed for cough 180 mL 0  . predniSONE (DELTASONE) 20 MG tablet Take 60mg  x 3 days, then 40mg  x3 days, then 20mg  x 3 days, then resume 10mg  18 tablet 0   No facility-administered medications prior to  visit.     Allergies:  Allergies  Allergen Reactions  . Keflex [Cephalexin] Anaphylaxis and Rash  . Doxycycline Other (See Comments)    Gave patient oral thrush  . Sulfa Antibiotics   . Avelox [Moxifloxacin Hcl In Nacl] Itching and Rash  . Toradol [Ketorolac Tromethamine] Swelling, Rash and Other (See Comments)    Bruising, swelling, rash at injection site    Social History   Social History  . Marital status: Divorced    Spouse name: N/A  . Number of children: 3  . Years of education: N/A   Occupational History  . disabled Disabled   Social History Main Topics  . Smoking status: Current Every Day Smoker    Packs/day: 1.00    Years: 25.00    Types: Cigarettes  . Smokeless tobacco: Never Used  . Alcohol use No  . Drug use: No  . Sexual activity: Not Currently    Birth control/ protection: Surgical   Other Topics Concern  . Not on file   Social History Narrative  . No narrative on file    Family History  Problem Relation Age of Onset  . Heart disease Mother   . Hypertension Sister   . Hypertension Brother      Review of Systems:  See HPI for pertinent ROS. All other ROS negative.    Physical Exam: Blood pressure (!) 148/88, pulse 92, temperature 98.1 F (36.7 C), temperature source Oral, resp. rate 18, weight 88.5 kg (195 lb 3.2 oz), SpO2 93 %., Body mass index is 36.88 kg/m. General: WF On [place] Oxygen. Appears in no acute distress. Head: Normocephalic, atraumatic, eyes without discharge, sclera non-icteric, nares are without discharge. Bilateral auditory canals clear, TM's are without perforation, pearly grey and translucent with reflective cone of light bilaterally. Oral cavity moist, posterior pharynx without exudate, erythema, peritonsillar abscess.  Neck: Supple. No thyromegaly. No lymphadenopathy. Lungs: She has wheezes throughout bilaterally but there is air movement.  Heart: RRR with S1 S2. No murmurs, rubs, or gallops. Musculoskeletal:  Strength and  tone normal for age. Extremities/Skin: Warm and dry. 1 + Edema of feet.   Neuro: Alert and oriented X 3. Moves all extremities spontaneously. Gait is normal. CNII-XII grossly in tact. Psych:  Responds to questions appropriately with a normal affect.     ASSESSMENT AND PLAN:  61 y.o. year old female with  1. Chronic hypoxemic respiratory failure (HCC) Depo-Medrol 80 mg IM given here. Duo Neb given here. She is to start the oral prednisone taper tomorrow. She is to start the Augmentin today. Take both the prednisone and the Augmentin as directed and complete both of them. Follow-up if symptoms worsen or if they are not return to her usual baseline  upon completion of these. I recommended follow-up visit with Dr. Buelah Manis here on Monday. However patient states that she will hold off on scheduling this right now and rather will just follow-up with [place] if needed.  2. COPD exacerbation (HCC) Depo-Medrol 80 mg IM given here. Duo Neb given here. She is to start the oral prednisone taper tomorrow. She is to start the Augmentin today. Take both the prednisone and the Augmentin as directed and complete both of them. Follow-up if symptoms worsen or if they are not return to her usual baseline upon completion of these. She requests refill on "cough syrup with codeine in it ". Prescription for Hycodan given to use. - predniSONE (DELTASONE) 20 MG tablet; Take 3 daily for 2 days, then 2 daily for 2 days, then 1 daily for 2 days.  Dispense: 12 tablet; Refill: 0 - amoxicillin-clavulanate (AUGMENTIN) 875-125 MG tablet; Take 1 tablet by mouth 2 (two) times daily.  Dispense: 28 tablet; Refill: 0 - HYDROcodone-homatropine (HYCODAN) 5-1.5 MG/5ML syrup; Take 5 mLs by mouth every 6 (six) hours as needed for cough.  Dispense: 120 mL; Refill: 0 I recommended follow-up visit with Dr. Buelah Manis here on Monday. However patient states that she will hold off on scheduling this right now and rather will just follow-up with [place]  if needed.  3. Lumbar back pain She requested refill on her oxycodone. Has her prior bottle with her which has one pill remaining in it. This bottle states to take 1 every 8 hours as needed and she was given #30 and that was on 06/22/17. Refill appropriate. - oxyCODONE-acetaminophen (PERCOCET/ROXICET) 5-325 MG tablet; take 1 tablet by mouth every 8 hours if needed for pain  Dispense: 30 tablet; Refill: 0   Signed, [address] [place], [place], [facility] 09/17/2017 12:40 PM

## 2017-09-20 ENCOUNTER — Other Ambulatory Visit: Payer: Self-pay | Admitting: Family Medicine

## 2017-09-20 DIAGNOSIS — K219 Gastro-esophageal reflux disease without esophagitis: Secondary | ICD-10-CM

## 2017-09-21 NOTE — Telephone Encounter (Signed)
Medication refilled per protocol. 

## 2017-10-01 ENCOUNTER — Ambulatory Visit (INDEPENDENT_AMBULATORY_CARE_PROVIDER_SITE_OTHER): Payer: Medicare Other | Admitting: Emergency Medicine

## 2017-10-01 ENCOUNTER — Encounter: Payer: Self-pay | Admitting: Emergency Medicine

## 2017-10-01 DIAGNOSIS — J411 Mucopurulent chronic bronchitis: Secondary | ICD-10-CM | POA: Diagnosis not present

## 2017-10-01 DIAGNOSIS — Z23 Encounter for immunization: Secondary | ICD-10-CM | POA: Diagnosis not present

## 2017-10-01 MED ORDER — GUAIFENESIN-CODEINE 100-10 MG/5ML PO SOLN: 5.0000 mL | Freq: Three times a day (TID) | ORAL | 0 refills | Status: DC | PRN

## 2017-10-01 NOTE — Addendum Note (Signed)
Addended by: Desmond Dike C on: 10/01/2017 10:29 AM   Modules accepted: Orders

## 2017-10-01 NOTE — Assessment & Plan Note (Signed)
Continue Spiriva, Symbicort, prednisone 10 mg, albuterol as needed. Discussed smoking cessation with her today. She does not have a plan, does not really want to stop. I suspect that she will continue to exacerbate frequently. Discussed briefly increasing her prednisone but she has concerns about side effects.  Discussed life-support, issues surrounding goals of care with her today. She is going to think about this. Gave her paperwork regarding goals of care

## 2017-10-01 NOTE — Patient Instructions (Signed)
Please continue current medications as you have been taking them  We discussed stopping smoking today. This is the only thing that will allow your lung disease to stabilize. Flu shot today We discussed life-support, issues surrounding severity of lung disease today. Please consider your wishes regarding CPR or life support so we can discuss in the future.  Follow with Dr Lamonte Sakai in 3 months or sooner if you have any problems.

## 2017-10-01 NOTE — Progress Notes (Signed)
  Subjective:    Patient ID: Cindy [name], female    DOB: [birth year], 61 y.o.   MRN: [id number]  HPI  ROV 06/26/17 -- [name] his severe COPD with persistent symptoms in the setting of continued tobacco abuse. She has chronic hypoxemic respiratory failure with very frequent exacerbations. She has been in the emergency rooms since our last visit. She was also seen by Dr. Buelah Manis on 6/25. She was treated with depomedrol, pred taper back down to 10mg . Azithromycin. She remains on symbicort and spiriva. Using albuterol prn, about once a day. Her oxygen is at 3L/min. She is smoking  . She is not on flonase right now, uses prn. She is interested in a POC  ROV 10/01/17 -- Patient has a history of continued tobacco use, severe COPD with persistent daily symptoms, frequent exacerbations. Currently on Spiriva and Symbicort. She is maintained on pred 10mg . Her last exacerbation was 9/20, treated with steroids and Augmentin. She notes B LE swelling, concerned that the pred is a contributor.  Using lasix prn based on her swelling and wt. She remains very limited, wears o2 at 2.5L/min. She is using albuterol about once a day. She remains on protonix and flonase. Discussed life-support, issues surrounding goals of care with her today. She is going to think about this.   PULMONARY FUNCTON TEST 09/27/2012  FVC 2.31  FEV1 .87  FEV1/FVC 37.7  FVC  % Predicted 76  FEV % Predicted 38  FeF 25-75 .3  FeF 25-75 % Predicted 2.65       Objective:   Physical Exam Vitals:   10/01/17 0946  BP: 128/86  Pulse: (!) 106  SpO2: 96%  Weight: 193 lb (87.5 kg)  Height: 5\' 1"  (1.549 m)   Gen: Pleasant, Obese woman, in no distress,   ENT: No lesions,  mouth clear,  oropharynx clear, no postnasal drip  Neck: No JVD, noStridor  Lungs: No use of accessory muscles, B loud exp wheezes  Cardiovascular: RRR, heart sounds normal, no murmur or gallops, 1+ lower ext peripheral edema  Musculoskeletal: No deformities, no  cyanosis or clubbing  Neuro: alert, non focal  Skin: Warm, no lesions or rashes     Assessment & Plan:  COPD (chronic obstructive pulmonary disease) Continue Spiriva, Symbicort, prednisone 10 mg, albuterol as needed. Discussed smoking cessation with her today. She does not have a plan, does not really want to stop. I suspect that she will continue to exacerbate frequently. Discussed briefly increasing her prednisone but she has concerns about side effects.  Discussed life-support, issues surrounding goals of care with her today. She is going to think about this. Gave her paperwork regarding goals of care  Baltazar Apo, MD, PhD 10/01/2017, 10:23 AM [facility] [phone] or if no answer [phone]

## 2017-10-12 ENCOUNTER — Other Ambulatory Visit: Payer: Self-pay | Admitting: Family Medicine

## 2017-10-12 DIAGNOSIS — J441 Chronic obstructive pulmonary disease with (acute) exacerbation: Secondary | ICD-10-CM

## 2017-10-13 ENCOUNTER — Encounter: Payer: Self-pay | Admitting: Family Medicine

## 2017-10-13 ENCOUNTER — Ambulatory Visit (INDEPENDENT_AMBULATORY_CARE_PROVIDER_SITE_OTHER): Payer: Medicare Other | Admitting: Family Medicine

## 2017-10-13 VITALS — BP 122/88 | HR 58 | Temp 98.4°F | Resp 20 | Ht 61.0 in | Wt 192.0 lb

## 2017-10-13 DIAGNOSIS — M8589 Other specified disorders of bone density and structure, multiple sites: Secondary | ICD-10-CM

## 2017-10-13 DIAGNOSIS — L851 Acquired keratosis [keratoderma] palmaris et plantaris: Secondary | ICD-10-CM | POA: Diagnosis not present

## 2017-10-13 DIAGNOSIS — R739 Hyperglycemia, unspecified: Secondary | ICD-10-CM | POA: Diagnosis not present

## 2017-10-13 DIAGNOSIS — Z0000 Encounter for general adult medical examination without abnormal findings: Secondary | ICD-10-CM

## 2017-10-13 DIAGNOSIS — E7800 Pure hypercholesterolemia, unspecified: Secondary | ICD-10-CM

## 2017-10-13 DIAGNOSIS — T50905A Adverse effect of unspecified drugs, medicaments and biological substances, initial encounter: Secondary | ICD-10-CM | POA: Diagnosis not present

## 2017-10-13 DIAGNOSIS — Z720 Tobacco use: Secondary | ICD-10-CM

## 2017-10-13 DIAGNOSIS — R252 Cramp and spasm: Secondary | ICD-10-CM | POA: Diagnosis not present

## 2017-10-13 MED ORDER — ALBUTEROL SULFATE (2.5 MG/3ML) 0.083% IN NEBU: 2.5000 mg | INHALATION_SOLUTION | RESPIRATORY_TRACT | 11 refills | Status: DC | PRN

## 2017-10-13 MED ORDER — FUROSEMIDE 20 MG PO TABS: 20.0000 mg | ORAL_TABLET | Freq: Every day | ORAL | 6 refills | Status: DC

## 2017-10-13 NOTE — Patient Instructions (Signed)
Take water pill once a day  Continue nebs Use Desitin  We will call with labs results Shingles vaccine to be ordered F/U 4 months
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
## 2017-10-14 LAB — COMPREHENSIVE METABOLIC PANEL
AG Ratio: 1.9 (calc) (ref 1.0–2.5)
ALBUMIN MSPROF: 3.9 g/dL (ref 3.6–5.1)
ALKALINE PHOSPHATASE (APISO): 62 U/L (ref 33–130)
ALT: 28 U/L (ref 6–29)
AST: 21 U/L (ref 10–35)
BILIRUBIN TOTAL: 0.4 mg/dL (ref 0.2–1.2)
BUN: 11 mg/dL (ref 7–25)
CALCIUM: 9.7 mg/dL (ref 8.6–10.4)
CHLORIDE: 100 mmol/L (ref 98–110)
CO2: 33 mmol/L — ABNORMAL HIGH (ref 20–32)
Creat: 0.89 mg/dL (ref 0.50–0.99)
GLOBULIN: 2.1 g/dL (ref 1.9–3.7)
Glucose, Bld: 89 mg/dL (ref 65–99)
POTASSIUM: 4.5 mmol/L (ref 3.5–5.3)
Sodium: 141 mmol/L (ref 135–146)
Total Protein: 6 g/dL — ABNORMAL LOW (ref 6.1–8.1)

## 2017-10-14 LAB — CBC WITH DIFFERENTIAL/PLATELET
BASOS PCT: 0.6 %
Basophils Absolute: 82 cells/uL (ref 0–200)
EOS ABS: 151 {cells}/uL (ref 15–500)
Eosinophils Relative: 1.1 %
HEMATOCRIT: 44 % (ref 35.0–45.0)
Hemoglobin: 14.2 g/dL (ref 11.7–15.5)
LYMPHS ABS: 4096 {cells}/uL — AB (ref 850–3900)
MCH: 30.5 pg (ref 27.0–33.0)
MCHC: 32.3 g/dL (ref 32.0–36.0)
MCV: 94.6 fL (ref 80.0–100.0)
MPV: 12 fL (ref 7.5–12.5)
Monocytes Relative: 10.1 %
NEUTROS PCT: 58.3 %
Neutro Abs: 7987 cells/uL — ABNORMAL HIGH (ref 1500–7800)
PLATELETS: 240 10*3/uL (ref 140–400)
RBC: 4.65 10*6/uL (ref 3.80–5.10)
RDW: 13 % (ref 11.0–15.0)
TOTAL LYMPHOCYTE: 29.9 %
WBC: 13.7 10*3/uL — AB (ref 3.8–10.8)
WBCMIX: 1384 {cells}/uL — AB (ref 200–950)

## 2017-10-14 LAB — HEMOGLOBIN A1C
EAG (MMOL/L): 7.3 (calc)
Hgb A1c MFr Bld: 6.2 % of total Hgb — ABNORMAL HIGH (ref <5.7)
Mean Plasma Glucose: 131 (calc)

## 2017-10-14 LAB — LIPID PANEL
CHOLESTEROL: 204 mg/dL — AB (ref <200)
HDL: 59 mg/dL (ref >50)
LDL Cholesterol (Calc): 117 mg/dL (calc) — ABNORMAL HIGH
Non-HDL Cholesterol (Calc): 145 mg/dL (calc) — ABNORMAL HIGH (ref <130)
TRIGLYCERIDES: 162 mg/dL — AB (ref <150)
Total CHOL/HDL Ratio: 3.5 (calc) (ref <5.0)

## 2017-10-14 LAB — MAGNESIUM: MAGNESIUM: 2 mg/dL (ref 1.5–2.5)

## 2017-11-06 ENCOUNTER — Telehealth: Payer: Self-pay | Admitting: Family Medicine

## 2017-11-06 MED ORDER — GUAIFENESIN-CODEINE 100-10 MG/5ML PO SOLN: 5.0000 mL | Freq: Three times a day (TID) | ORAL | 0 refills | Status: DC | PRN

## 2017-11-06 NOTE — Telephone Encounter (Signed)
Okay to refill the cough medicine

## 2017-11-06 NOTE — Telephone Encounter (Signed)
Medication called to pharmacy. 

## 2017-11-06 NOTE — Telephone Encounter (Signed)
Call placed to patient.   States that she needs the Robitussin AC refilled for cough.   Ok to refill??

## 2017-11-06 NOTE — Telephone Encounter (Signed)
Pt was calling saying she needs a refill on the medicine [place] called in for her cough it was not the guaifenesin syrup she said that is not the correct one, and she does not know the name of the medicine sent to [facility] [place].

## 2017-11-18 ENCOUNTER — Encounter: Payer: Self-pay | Admitting: Family Medicine

## 2017-11-18 ENCOUNTER — Ambulatory Visit (INDEPENDENT_AMBULATORY_CARE_PROVIDER_SITE_OTHER): Payer: Medicare Other | Admitting: Family Medicine

## 2017-11-18 ENCOUNTER — Other Ambulatory Visit: Payer: Self-pay

## 2017-11-18 ENCOUNTER — Telehealth: Payer: Self-pay | Admitting: Family Medicine

## 2017-11-18 VITALS — BP 142/88 | HR 82 | Temp 97.8°F | Resp 24 | Ht 61.0 in | Wt 196.0 lb

## 2017-11-18 DIAGNOSIS — J441 Chronic obstructive pulmonary disease with (acute) exacerbation: Secondary | ICD-10-CM

## 2017-11-18 DIAGNOSIS — K5900 Constipation, unspecified: Secondary | ICD-10-CM | POA: Diagnosis not present

## 2017-11-18 DIAGNOSIS — J9611 Chronic respiratory failure with hypoxia: Secondary | ICD-10-CM | POA: Diagnosis not present

## 2017-11-18 DIAGNOSIS — B372 Candidiasis of skin and nail: Secondary | ICD-10-CM

## 2017-11-18 DIAGNOSIS — Z720 Tobacco use: Secondary | ICD-10-CM

## 2017-11-18 DIAGNOSIS — R252 Cramp and spasm: Secondary | ICD-10-CM | POA: Diagnosis not present

## 2017-11-18 MED ORDER — FLUCONAZOLE 150 MG PO TABS: ORAL_TABLET | ORAL | 1 refills | Status: DC

## 2017-11-18 MED ORDER — METHYLPREDNISOLONE ACETATE 80 MG/ML IJ SUSP
80.0000 mg | Freq: Once | INTRAMUSCULAR | Status: AC
  Administered 2017-11-18: 80 mg via INTRAMUSCULAR

## 2017-11-18 MED ORDER — PREDNISONE 20 MG PO TABS: ORAL_TABLET | ORAL | 0 refills | Status: DC

## 2017-11-18 MED ORDER — AMOXICILLIN-POT CLAVULANATE 875-125 MG PO TABS: 1.0000 | ORAL_TABLET | Freq: Two times a day (BID) | ORAL | 0 refills | Status: DC

## 2017-11-18 MED ORDER — CLOTRIMAZOLE-BETAMETHASONE 1-0.05 % EX CREA: 1.0000 "application " | TOPICAL_CREAM | Freq: Two times a day (BID) | CUTANEOUS | 0 refills | Status: DC

## 2017-11-18 MED ORDER — IPRATROPIUM-ALBUTEROL 0.5-2.5 (3) MG/3ML IN SOLN
3.0000 mL | Freq: Once | RESPIRATORY_TRACT | Status: AC
  Administered 2017-11-18: 3 mL via RESPIRATORY_TRACT

## 2017-11-18 MED ORDER — GUAIFENESIN-CODEINE 100-10 MG/5ML PO SOLN: 5.0000 mL | Freq: Three times a day (TID) | ORAL | 0 refills | Status: DC | PRN

## 2017-11-18 NOTE — Patient Instructions (Addendum)
Take antibiotics Take prednisone Use nebulizer for next 2 days Use fungal cream  Take magnesium 250mg  once a day  F/U as previous

## 2017-11-18 NOTE — Progress Notes (Signed)
   Subjective:    Patient ID: Cindy [name], female    DOB: Nov 28, 1956, 61 y.o.   MRN: [id number]  Patient presents for COPD Exacerbation (states that she has been leading up to this x1 week,but only gotten worse on 11/20- reports wheezing and SOB)   Pt here with COPD exacerbation for past few days, increased cough with congestion. SOB, wheezing, using inhaler   Bottom is still broken out, tried Desitin, has itching on bottom   using benadryl for itching   Gets leg cramps a lot       Review Of Systems:  GEN- denies fatigue, fever, weight loss,weakness, recent illness HEENT- denies eye drainage, change in vision, nasal discharge, CVS- denies chest pain, palpitations RESP- denies SOB, cough, wheeze ABD- denies N/V, change in stools, abd pain GU- denies dysuria, hematuria, dribbling, incontinence MSK- denies joint pain, muscle aches, injury Neuro- denies headache, dizziness, syncope, seizure activity       Objective:    BP (!) 142/88   Pulse 82   Temp 97.8 F (36.6 C) (Oral)   Resp (!) 24   Ht 5\' 1"  (1.549 m)   Wt 196 lb (88.9 kg)   SpO2 94% Comment: 2.5 L/min via [place]  BMI 37.03 kg/m  GEN- NAD, alert and oriented x3 HEENT- PERRL, EOMI, non injected sclera, pink conjunctiva, MMM, oropharynx clear Neck- Supple, no LAD CVS- RRR, no murmur RESP- bronchospasm bilat, wheezing througjout and auidble, increased WOB at rest, rhonchi/junky sounding all over  ABD-NABS,soft,NT,ND, large pannus Skin- erythema with few scabs in gluteal cleft, cracked skin over perineum EXT- trace ankle edema Pulses- Radial, DP- 2+        Assessment & Plan:      Problem List Items Addressed This Visit      Unprioritized   Chronic hypoxemic respiratory failure (HCC)   Relevant Medications   methylPREDNISolone acetate (DEPO-MEDROL) injection 80 mg (Completed)   ipratropium-albuterol (DUONEB) 0.5-2.5 (3) MG/3ML nebulizer solution 3 mL (Completed)   Tobacco abuse (Chronic)    Other Visit  Diagnoses    COPD exacerbation (HCC)    -  Primary   COPE exacerbation as typical, given Duoneb to improve WOB, Depo Medrol 80mg , Taper of steroids, augmentin, advised to go to ER if she does not improve, use nebs   Relevant Medications   predniSONE (DELTASONE) 10 MG tablet   predniSONE (DELTASONE) 20 MG tablet   guaiFENesin-codeine 100-10 MG/5ML syrup   methylPREDNISolone acetate (DEPO-MEDROL) injection 80 mg (Completed)   ipratropium-albuterol (DUONEB) 0.5-2.5 (3) MG/3ML nebulizer solution 3 mL (Completed)   Leg cramps       Try magneisum OTC tabs, also has some constiaption some this will help, given samples of miralax as well    Constipation, unspecified constipation type       Candidal intertrigo       In gluteal cleft, advised to use lotrisone due to severe itching as well, do not put on broken skin   Relevant Medications   fluconazole (DIFLUCAN) 150 MG tablet   clotrimazole-betamethasone (LOTRISONE) cream      Note: This dictation was prepared with Dragon dictation along with smaller phrase technology. Any transcriptional errors that result from this process are unintentional.

## 2017-11-24 NOTE — Telephone Encounter (Signed)
error 

## 2017-11-26 ENCOUNTER — Telehealth: Payer: Self-pay | Admitting: Family Medicine

## 2017-11-26 MED ORDER — FLUTICASONE PROPIONATE 50 MCG/ACT NA SUSP
1.0000 | Freq: Every day | NASAL | 3 refills | Status: DC | PRN
Start: 2017-11-26 — End: 2018-02-25

## 2017-11-26 NOTE — Telephone Encounter (Signed)
Pt states that she has a sore in her nose and wants to know if there is something that we can recommend for it. It is causing her to have problems with wearing her oxygen. Is there anything that can be given otc or prescription wise for this if we can call something in [facility] [place].

## 2017-11-26 NOTE — Telephone Encounter (Signed)
Call placed to patient to inquire.   States that she has sore area in R nare that does bleed at times. Advised to use OTC Neosporin to area as needed for pain and to protect area. Verbalized understanding.   Also requested refill on Flonase. Prescription sent to pharmacy.

## 2017-11-30 ENCOUNTER — Ambulatory Visit (HOSPITAL_COMMUNITY)
Admission: RE | Admit: 2017-11-30 | Discharge: 2017-11-30 | Disposition: A | Discharge status: Home / Self Care | Payer: Medicare Other | Source: Ambulatory Visit | Attending: Urology | Admitting: Urology

## 2017-11-30 ENCOUNTER — Other Ambulatory Visit: Payer: Self-pay | Admitting: Urology

## 2017-11-30 DIAGNOSIS — N200 Calculus of kidney: Secondary | ICD-10-CM

## 2017-12-21 ENCOUNTER — Other Ambulatory Visit: Payer: Self-pay | Admitting: *Deleted

## 2017-12-21 MED ORDER — ALENDRONATE SODIUM 70 MG PO TABS: ORAL_TABLET | ORAL | 6 refills | Status: DC

## 2017-12-24 ENCOUNTER — Ambulatory Visit (INDEPENDENT_AMBULATORY_CARE_PROVIDER_SITE_OTHER): Payer: Medicare Other | Admitting: Physician Assistant

## 2017-12-24 ENCOUNTER — Encounter: Payer: Self-pay | Admitting: Physician Assistant

## 2017-12-24 ENCOUNTER — Other Ambulatory Visit: Payer: Self-pay

## 2017-12-24 VITALS — BP 142/86 | HR 88 | Temp 97.7°F | Resp 18 | Wt 194.8 lb

## 2017-12-24 DIAGNOSIS — M545 Low back pain, unspecified: Secondary | ICD-10-CM

## 2017-12-24 DIAGNOSIS — J441 Chronic obstructive pulmonary disease with (acute) exacerbation: Secondary | ICD-10-CM | POA: Diagnosis not present

## 2017-12-24 MED ORDER — OXYCODONE-ACETAMINOPHEN 5-325 MG PO TABS: ORAL_TABLET | ORAL | 0 refills | Status: DC

## 2017-12-24 MED ORDER — PREDNISONE 20 MG PO TABS: ORAL_TABLET | ORAL | 0 refills | Status: DC

## 2017-12-24 MED ORDER — AMOXICILLIN-POT CLAVULANATE 875-125 MG PO TABS: 1.0000 | ORAL_TABLET | Freq: Two times a day (BID) | ORAL | 0 refills | Status: AC

## 2017-12-24 MED ORDER — METHYLPREDNISOLONE ACETATE 80 MG/ML IJ SUSP
80.0000 mg | Freq: Once | INTRAMUSCULAR | Status: AC
  Administered 2017-12-24: 80 mg via INTRAMUSCULAR

## 2017-12-24 MED ORDER — IPRATROPIUM-ALBUTEROL 0.5-2.5 (3) MG/3ML IN SOLN
3.0000 mL | Freq: Once | RESPIRATORY_TRACT | Status: AC
  Administered 2017-12-24: 3 mL via RESPIRATORY_TRACT

## 2017-12-24 MED ORDER — GUAIFENESIN-CODEINE 100-10 MG/5ML PO SOLN: 5.0000 mL | Freq: Three times a day (TID) | ORAL | 0 refills | Status: DC | PRN

## 2017-12-24 NOTE — Progress Notes (Signed)
Patient ID: IRA BUSBIN MRN: [id number], DOB: [birth year], 61 y.o. Date of Encounter: 12/24/2017, 11:02 AM    Chief Complaint:  Chief Complaint  Patient presents with  . chest congestion  . right leg pain  . medication refills    albuterol sulfate nebu soln,codeine solution,,oxycodone,     HPI: 61 y.o. year old female presents with above.   I reviewed her chart.  She sees pulmonary. She has a history of severe COPD, chronic hypoxemic respiratory failure, chronic prednisone use.  Continues to smoke. Is on nasal cannula oxygen, Spiriva, Symbicort, pro-air as needed.  She has a history of frequent COPD exacerbations and presents with symptoms consistent with her usual COPD exacerbations. She reports that she is having increased wheezing and shortness of breath. Also increased chest congestion and phlegm.  States that she used a "breathing treatment "this morning at 6:30 AM with minimal relief.   Says that she also needs a refill on her pain medication.  That she has chronic long-term back pain and uses the prescription medication sparingly.   Home Meds:   Outpatient Medications Prior to Visit  Medication Sig Dispense Refill  . albuterol (PROVENTIL HFA;VENTOLIN HFA) 108 (90 Base) MCG/ACT inhaler Inhale 2 puffs into the lungs every 6 (six) hours as needed for wheezing or shortness of breath. 1 Inhaler 11  . albuterol (PROVENTIL) (2.5 MG/3ML) 0.083% nebulizer solution Take 3 mLs (2.5 mg total) by nebulization every 4 (four) hours as needed for wheezing or shortness of breath. 75 mL 11  . alendronate (FOSAMAX) 70 MG tablet take 1 tablet by mouth every week on an empty stomach with 6 4 tablet 6  . aspirin EC 81 MG tablet Take 81 mg by mouth every morning.     . Calcium Carb-Cholecalciferol 600-800 MG-UNIT TABS Take 1 tablet by mouth daily.    . clotrimazole-betamethasone (LOTRISONE) cream Apply 1 application topically 2 (two) times daily. 30 g 0  . fluticasone (FLONASE) 50 MCG/ACT  nasal spray Place 1 spray into both nostrils daily as needed for allergies or rhinitis. 48 g 3  . furosemide (LASIX) 20 MG tablet Take 1 tablet (20 mg total) by mouth daily. 30 tablet 6  . guaiFENesin-codeine 100-10 MG/5ML syrup Take 5 mLs by mouth 3 (three) times daily as needed for cough. 180 mL 0  . nystatin (MYCOSTATIN) 100000 UNIT/ML suspension Use as directed 5 mLs (500,000 Units total) in the mouth or throat 4 (four) times daily. 240 mL 3  . oxyCODONE-acetaminophen (PERCOCET/ROXICET) 5-325 MG tablet take 1 tablet by mouth every 8 hours if needed for pain 30 tablet 0  . OXYGEN Inhale 2.5 L into the lungs continuous.     . pantoprazole (PROTONIX) 40 MG tablet take 1 tablet by mouth once daily 90 tablet 1  . simvastatin (ZOCOR) 40 MG tablet Take 1 tablet (40 mg total) by mouth at bedtime. 90 tablet 3  . SPIRIVA RESPIMAT 2.5 MCG/ACT AERS INHALE 2 PUFFS INTO THE LUNGS ONCE DAILY 4 g 4  . SYMBICORT 160-4.5 MCG/ACT inhaler inhale 2 puffs by mouth twice a day 10.2 g 11  . amoxicillin-clavulanate (AUGMENTIN) 875-125 MG tablet Take 1 tablet by mouth 2 (two) times daily. 20 tablet 0  . fluconazole (DIFLUCAN) 150 MG tablet Take 1 tablet repeat in 3 days 2 tablet 1  . predniSONE (DELTASONE) 10 MG tablet Take 10 mg by mouth daily with breakfast.    . predniSONE (DELTASONE) 20 MG tablet Take 60mg  x 3 days, then  40mg  x 3 days, then 20mg  x 3 days, then back to regular 10mg  daily 18 tablet 0   No facility-administered medications prior to visit.     Allergies:  Allergies  Allergen Reactions  . Keflex [Cephalexin] Anaphylaxis and Rash  . Doxycycline Other (See Comments)    Gave patient oral thrush  . Sulfa Antibiotics   . Avelox [Moxifloxacin Hcl In Nacl] Itching and Rash  . Toradol [Ketorolac Tromethamine] Swelling, Rash and Other (See Comments)    Bruising, swelling, rash at injection site      Review of Systems: See HPI for pertinent ROS. All other ROS negative.    Physical Exam: Blood  pressure (!) 142/86, pulse 88, temperature 97.7 F (36.5 C), temperature source Oral, resp. rate 18, weight 88.4 kg (194 lb 12.8 oz), SpO2 97 %., There is no height or weight on file to calculate BMI. General: WF. Appears in no acute distress. HEENT: Normocephalic, atraumatic, eyes without discharge, sclera non-icteric, nares are without discharge. Bilateral auditory canals clear, TM's are without perforation, pearly grey and translucent with reflective cone of light bilaterally. Oral cavity moist, posterior pharynx without exudate, erythema, peritonsillar abscess.  Neck: Supple. No thyromegaly. No lymphadenopathy. Lungs: She has harsh, coarse wheezes throughout bilaterally but does have good air movement. Heart: Regular rhythm. No murmurs, rubs, or gallops. Msk:  Strength and tone normal for age. Extremities/Skin: Warm and dry.  Neuro: Alert and oriented X 3. Moves all extremities spontaneously. Gait is normal. CNII-XII grossly in tact. Psych:  Responds to questions appropriately with a normal affect.     ASSESSMENT AND PLAN:  61 y.o. year old female with   1. COPD exacerbation (HCC) ODepoMedrol 80 mg IM given here. DuoNeb given here. She is to start the oral prednisone tomorrow. She is to start the Augmentin today. Will take the prednisone and the Augmentin as directed and complete both of them. Follow-up if symptoms worsen or if symptoms do not return to usual baseline upon completion of these. - amoxicillin-clavulanate (AUGMENTIN) 875-125 MG tablet; Take 1 tablet by mouth every 12 (twelve) hours for 7 days.  Dispense: 14 tablet; Refill: 0 - predniSONE (DELTASONE) 20 MG tablet; Take 2 daily for 5 days  Dispense: 10 tablet; Refill: 0 - guaiFENesin-codeine 100-10 MG/5ML syrup; Take 5 mLs by mouth 3 (three) times daily as needed for cough.  Dispense: 180 mL; Refill: 0 - methylPREDNISolone acetate (DEPO-MEDROL) injection 80 mg - ipratropium-albuterol (DUONEB) 0.5-2.5 (3) MG/3ML nebulizer  solution 3 mL  2. Lumbar back pain Last prescription for Percocet was given greater than 30 days ago.  Will refill this pain medication at this time. - oxyCODONE-acetaminophen (PERCOCET/ROXICET) 5-325 MG tablet; take 1 tablet by mouth every 8 hours if needed for pain  Dispense: 30 tablet; Refill: 0    Signed, [address] [place], [place], [facility] 12/24/2017 11:02 AM

## 2018-01-05 ENCOUNTER — Other Ambulatory Visit: Payer: Self-pay | Admitting: *Deleted

## 2018-01-05 ENCOUNTER — Ambulatory Visit: Payer: Medicare Other | Admitting: Emergency Medicine

## 2018-01-05 DIAGNOSIS — J441 Chronic obstructive pulmonary disease with (acute) exacerbation: Secondary | ICD-10-CM

## 2018-01-05 MED ORDER — PREDNISONE 10 MG PO TABS: 10.0000 mg | ORAL_TABLET | Freq: Every day | ORAL | 5 refills | Status: DC

## 2018-01-28 ENCOUNTER — Ambulatory Visit: Payer: Medicare Other | Admitting: Emergency Medicine

## 2018-01-29 ENCOUNTER — Encounter: Payer: Self-pay | Admitting: Family Medicine

## 2018-01-29 ENCOUNTER — Telehealth: Payer: Self-pay

## 2018-01-29 ENCOUNTER — Ambulatory Visit (INDEPENDENT_AMBULATORY_CARE_PROVIDER_SITE_OTHER): Payer: Medicare Other | Admitting: Family Medicine

## 2018-01-29 ENCOUNTER — Other Ambulatory Visit: Payer: Self-pay

## 2018-01-29 VITALS — BP 136/78 | HR 110 | Temp 98.3°F | Resp 22 | Ht 61.0 in | Wt 196.0 lb

## 2018-01-29 DIAGNOSIS — J9611 Chronic respiratory failure with hypoxia: Secondary | ICD-10-CM | POA: Diagnosis not present

## 2018-01-29 DIAGNOSIS — J441 Chronic obstructive pulmonary disease with (acute) exacerbation: Secondary | ICD-10-CM | POA: Diagnosis not present

## 2018-01-29 MED ORDER — METHYLPREDNISOLONE ACETATE 80 MG/ML IJ SUSP
80.0000 mg | Freq: Once | INTRAMUSCULAR | Status: AC
  Administered 2018-01-29: 80 mg via INTRAMUSCULAR

## 2018-01-29 MED ORDER — PREDNISONE 10 MG PO TABS: ORAL_TABLET | ORAL | 0 refills | Status: DC

## 2018-01-29 MED ORDER — GUAIFENESIN-CODEINE 100-10 MG/5ML PO SOLN: 5.0000 mL | Freq: Three times a day (TID) | ORAL | 0 refills | Status: DC | PRN

## 2018-01-29 MED ORDER — TIOTROPIUM BROMIDE MONOHYDRATE 2.5 MCG/ACT IN AERS: INHALATION_SPRAY | RESPIRATORY_TRACT | 11 refills | Status: DC

## 2018-01-29 MED ORDER — PREDNISONE 20 MG PO TABS: ORAL_TABLET | ORAL | 0 refills | Status: DC

## 2018-01-29 MED ORDER — NYSTATIN 100000 UNIT/ML MT SUSP: 5.0000 mL | Freq: Four times a day (QID) | OROMUCOSAL | 3 refills | Status: DC

## 2018-01-29 MED ORDER — IPRATROPIUM-ALBUTEROL 0.5-2.5 (3) MG/3ML IN SOLN
3.0000 mL | Freq: Once | RESPIRATORY_TRACT | Status: AC
  Administered 2018-01-29: 3 mL via RESPIRATORY_TRACT

## 2018-01-29 NOTE — Telephone Encounter (Signed)
Change made and patient is aware

## 2018-01-29 NOTE — Telephone Encounter (Signed)
Patient was in office today and was prescribed prednisone 10 mg with directions take 60mg  x3 days,40mg  x3days,20mg s x 3days. Patient states per pharmacy insurance will not pay since rx  for Prednisone 10mg  was filled 01/28/2018.  I spoke with pharmacy and  they confirmed that insurance will not pay. Patient is asking if rx can be changed to 20 mg with same directions.

## 2018-01-29 NOTE — Patient Instructions (Signed)
F/U as needed Go to ER if you do not improve

## 2018-01-29 NOTE — Progress Notes (Signed)
   Subjective:    Patient ID: Cindy [name], female    DOB: 1956-01-28, 62 y.o.   MRN: [id number]  Patient presents for Dyspnea on Exertion (x months- states exaceration has worsened in the last week)  Pt here with SOB, COugh, congestion typical of her COPD exacerbations, chest tightness.  Last used neb last night  She has end stage COPD on oxygen therapy, max inhalers/nebs on daily prednisone Follows with pulmonary last seen in Oct, note due to her advanced stage noted she will exacerbate frequently per the notes. She cancelled her lung appointment for yesterday , states she didn't feel well Using 3L of oxygen, no fever     Review Of Systems:  GEN- denies fatigue, fever, weight loss,weakness, recent illness HEENT- denies eye drainage, change in vision, nasal discharge, CVS- denies chest pain, palpitations RESP- denies SOB+, cough, wheeze ABD- denies N/V, change in stools, abd pain GU- denies dysuria, hematuria, dribbling, incontinence MSK- denies joint pain, muscle aches, injury Neuro- denies headache, dizziness, syncope, seizure activity       Objective:    BP 136/78   Pulse (!) 110   Temp 98.3 F (36.8 C) (Oral)   Resp (!) 22   Ht 5\' 1"  (1.549 m)   Wt 196 lb (88.9 kg)   SpO2 93% Comment: 3 L/min via [place]  BMI 37.03 kg/m  GEN- NAD, alert and oriented x3 HEENT- PERRL, EOMI, non injected sclera, pink conjunctiva, MMM, oropharynx clear Neck- Supple, no LAD CVS- tachycardic , no murmur RESP- bronchospasm bilat, wheezingthroughout, increased WOB at rest, ABD-NABS,soft,NT,ND, large pannus EXT- NON PITTING PEDAL EDEMA         Assessment & Plan:      Problem List Items Addressed This Visit      Unprioritized   Chronic hypoxemic respiratory failure ([place]) - Primary    Other Visit Diagnoses    COPD exacerbation ([place])       Given Duoneb in office, 80mg  Depo Medrol. Continue nebs Every 4 hours at home, prednisone taper, augmentin she still responds to  discussed  following up with her lung doctor To ER if she does not improve Continue allergy medications      Note: This dictation was prepared with Dragon dictation along with smaller phrase technology. Any transcriptional errors that result from this process are unintentional.

## 2018-01-29 NOTE — Telephone Encounter (Signed)
Okay to change to the 20mg  tablets

## 2018-02-01 ENCOUNTER — Telehealth: Payer: Self-pay | Admitting: Family Medicine

## 2018-02-01 MED ORDER — AMOXICILLIN-POT CLAVULANATE 875-125 MG PO TABS: 1.0000 | ORAL_TABLET | Freq: Two times a day (BID) | ORAL | 0 refills | Status: DC

## 2018-02-01 NOTE — Telephone Encounter (Signed)
Antibiotics sent in Okay to refill nebs, get whatever form is needed If she is sounds bad, send her to ER

## 2018-02-01 NOTE — Telephone Encounter (Signed)
Dr. Buelah Manis   Please advise-  Pt called in today stating she was seen on 2/1 and she thought she was going to get a rx for an antibiotic sent in along with her prednisone. She states she has been coughing up greenish phlegm. She is still having the increase dyspnea but has started the pred taper on Saturday.    Fyi: pt was also calling because she has not been able to pick up her albuterol neb, per pharmacy they faxed over [employer] form to be filled out today.

## 2018-02-01 NOTE — Telephone Encounter (Signed)
Received DWO and MD completed.   Faxed to pharmacy.

## 2018-02-25 ENCOUNTER — Encounter: Payer: Self-pay | Admitting: Emergency Medicine

## 2018-02-25 ENCOUNTER — Ambulatory Visit (INDEPENDENT_AMBULATORY_CARE_PROVIDER_SITE_OTHER): Payer: Medicare Other | Admitting: Emergency Medicine

## 2018-02-25 DIAGNOSIS — J411 Mucopurulent chronic bronchitis: Secondary | ICD-10-CM

## 2018-02-25 MED ORDER — ALBUTEROL SULFATE HFA 108 (90 BASE) MCG/ACT IN AERS: 2.0000 | INHALATION_SPRAY | Freq: Four times a day (QID) | RESPIRATORY_TRACT | 5 refills | Status: DC | PRN

## 2018-02-25 MED ORDER — ROFLUMILAST 250 MCG PO TABS: 1.0000 | ORAL_TABLET | Freq: Every day | ORAL | 5 refills | Status: DC

## 2018-02-25 NOTE — Progress Notes (Signed)
Subjective:    Patient ID: Cindy [name], female    DOB: 01-Apr-1956, 62 y.o.   MRN: [id number]  HPI  ROV 06/26/17 -- Mrs. Whitcher his severe COPD with persistent symptoms in the setting of continued tobacco abuse. She has chronic hypoxemic respiratory failure with very frequent exacerbations. She has been in the emergency rooms since our last visit. She was also seen by Dr. Buelah Manis on 6/25. She was treated with depomedrol, pred taper back down to 10mg . Azithromycin. She remains on symbicort and spiriva. Using albuterol prn, about once a day. Her oxygen is at 3L/min. She is smoking  . She is not on flonase right now, uses prn. She is interested in a POC  ROV 10/01/17 -- Patient has a history of continued tobacco use, severe COPD with persistent daily symptoms, frequent exacerbations. Currently on Spiriva and Symbicort. She is maintained on pred 10mg . Her last exacerbation was 9/20, treated with steroids and Augmentin. She notes B LE swelling, concerned that the pred is a contributor.  Using lasix prn based on her swelling and wt. She remains very limited, wears o2 at 2.5L/min. She is using albuterol about once a day. She remains on protonix and flonase. Discussed life-support, issues surrounding goals of care with her today. She is going to think about this.  ROV 02/25/18 --this is a follow-up visit.  62 year old woman with a history of severe COPD, chronic cough with dyspnea, continued tobacco use.  We have been managing her on prednisone 10 mg daily, Spiriva, Symbicort.  She has continued to have recurrent symptoms, exacerbations about monthly. Last pred taper and abx was early February. She believes that we tried daliresp at some point in the past. She continues to have exertional SOB. She is coughing up clear mucous. Albuterol nebs about 2x a day.     PULMONARY FUNCTON TEST 09/27/2012  FVC 2.31  FEV1 .87  FEV1/FVC 37.7  FVC  % Predicted 76  FEV % Predicted 38  FeF 25-75 .3  FeF 25-75 % Predicted  2.65       Objective:   Physical Exam Vitals:   02/25/18 1015 02/25/18 1016  BP:  (!) 142/78  Pulse:  (!) 104  SpO2:  95%  Weight: 198 lb 6.4 oz (90 kg)   Height: 5\' 2"  (1.575 m)    Gen: Pleasant, Obese woman, in no distress,   ENT: No lesions,  mouth clear,  oropharynx clear, no postnasal drip  Neck: No JVD, noStridor  Lungs: No use of accessory muscles, B loud exp wheezes  Cardiovascular: RRR, heart sounds normal, no murmur or gallops, 1+ lower ext peripheral edema  Musculoskeletal: No deformities, no cyanosis or clubbing  Neuro: alert, non focal  Skin: Warm, no lesions or rashes     Assessment & Plan:  COPD (chronic obstructive pulmonary disease) Please continue your Spiriva, Symbicort, Prednisone 10mg  as you are using them Take albuterol either nebulized or 2 puffs up to every 4 hours if needed for shortness of breath.  Please work hard on decreasing your smoking.  None of her medicines are ever going to be enough to control your symptoms or stop your flareups until you are able to get off cigarettes.   We will try starting Daliresp 250 mcg daily.  Please call us if you have side effects from this medication especially upset stomach. Follow with Dr Lamonte Sakai in 2 months or sooner if you have any problems.  Baltazar Apo, MD, PhD 02/25/2018, 10:41 AM [facility] [phone] or if no answer [phone]

## 2018-02-25 NOTE — Patient Instructions (Addendum)
Please continue your Spiriva, Symbicort, Prednisone 10mg  as you are using them Take albuterol either nebulized or 2 puffs up to every 4 hours if needed for shortness of breath.  Please work hard on decreasing your smoking.  None of her medicines are ever going to be enough to control your symptoms or stop your flareups until you are able to get off cigarettes.   We will try starting Daliresp 250 mcg daily.  Please call us if you have side effects from this medication especially upset stomach. Follow with Dr Lamonte Sakai in 2 months or sooner if you have any problems.

## 2018-02-25 NOTE — Assessment & Plan Note (Signed)
Please continue your Spiriva, Symbicort, Prednisone 10mg  as you are using them Take albuterol either nebulized or 2 puffs up to every 4 hours if needed for shortness of breath.  Please work hard on decreasing your smoking.  None of her medicines are ever going to be enough to control your symptoms or stop your flareups until you are able to get off cigarettes.   We will try starting Daliresp 250 mcg daily.  Please call us if you have side effects from this medication especially upset stomach. Follow with Dr Lamonte Sakai in 2 months or sooner if you have any problems.

## 2018-02-25 NOTE — Addendum Note (Signed)
Addended by: Desmond Dike C on: 02/25/2018 10:48 AM   Modules accepted: Orders

## 2018-02-26 ENCOUNTER — Telehealth: Payer: Self-pay | Admitting: Emergency Medicine

## 2018-02-26 NOTE — Telephone Encounter (Signed)
Attempted PA on CMM for Daliresp 250 mg. Advised pt PA was done. Will route to [place]: [id number] - PA Case ID: [id number] Need help? Call us at [phone] Status Sent to [place] Electronic PA Form  Will await response.        ID [id number]  Bin [id number] Pcn [phone]   [phone]

## 2018-03-01 NOTE — Telephone Encounter (Signed)
Pt is returning call for update on this PA. Cb is [phone].

## 2018-03-01 NOTE — Telephone Encounter (Signed)
Pt checking status of PA.  I advised that we are still waiting on insurance determination and that we will update her as we hear from her insurance company.  Pt expressed understanding.

## 2018-03-01 NOTE — Telephone Encounter (Signed)
PA was denied with no reasoning documented for denial. Initiated appeal via [email or web address] based on last office note- pt is still having symptoms despite being on symbicort, spiriva, and maintenance prednisone.  Will await appeal decision.

## 2018-03-02 ENCOUNTER — Other Ambulatory Visit: Payer: Self-pay | Admitting: *Deleted

## 2018-03-02 DIAGNOSIS — J441 Chronic obstructive pulmonary disease with (acute) exacerbation: Secondary | ICD-10-CM

## 2018-03-02 MED ORDER — TIOTROPIUM BROMIDE MONOHYDRATE 2.5 MCG/ACT IN AERS: INHALATION_SPRAY | RESPIRATORY_TRACT | 11 refills | Status: DC

## 2018-03-02 NOTE — Telephone Encounter (Signed)
Checked PA via [facility] and it is still pending, will advise patient once this has been determined.

## 2018-03-03 NOTE — Telephone Encounter (Signed)
Checked status via [email or web address]- still pending.  States it takes approx 3-7 days for a determination.  Will continue to follow up on.

## 2018-03-04 ENCOUNTER — Other Ambulatory Visit: Payer: Self-pay | Admitting: Family Medicine

## 2018-03-04 DIAGNOSIS — K219 Gastro-esophageal reflux disease without esophagitis: Secondary | ICD-10-CM

## 2018-03-04 DIAGNOSIS — J441 Chronic obstructive pulmonary disease with (acute) exacerbation: Secondary | ICD-10-CM

## 2018-03-04 NOTE — Telephone Encounter (Signed)
Pt needs refill on her cough syrup. Sent to [facility] [address]

## 2018-03-04 NOTE — Telephone Encounter (Signed)
Per CMM- PA is still pending.  

## 2018-03-04 NOTE — Telephone Encounter (Signed)
Ok to refill??  Last office visit/ refill 01/29/2018.

## 2018-03-05 MED ORDER — GUAIFENESIN-CODEINE 100-10 MG/5ML PO SOLN: 5.0000 mL | Freq: Three times a day (TID) | ORAL | 0 refills | Status: DC | PRN

## 2018-03-05 NOTE — Telephone Encounter (Signed)
Pt is calling about an update an this PA. Cb is [phone]

## 2018-03-05 NOTE — Telephone Encounter (Signed)
Checked status via [email or web address], still pending. atc pt to make aware, no answer and no vm.  Wcb.

## 2018-03-08 NOTE — Telephone Encounter (Signed)
Georges Lynch (Key: Genevie Cheshire)  Your information has been submitted to [facility]. [employer] will review the request and will issue a decision, typically within 3-7 days from your submission. You can check the updated outcome later by reopening this request.  If [employer] has not responded in 3-7 days or if you have any questions about your ePA request, please contact [employer] at [phone]. If you think there may be a problem with your PA request, use our live chat feature at the bottom right.

## 2018-03-09 NOTE — Telephone Encounter (Signed)
PA is still pending as of this morning. 

## 2018-03-10 NOTE — Telephone Encounter (Signed)
Pt's Daliresp has been denied.  Dr. Lamonte Sakai - please advise. Thanks.

## 2018-03-12 NOTE — Telephone Encounter (Signed)
If it is denied then I guess there is nothing to do.

## 2018-03-14 ENCOUNTER — Encounter (HOSPITAL_COMMUNITY): Payer: Self-pay | Admitting: Emergency Medicine

## 2018-03-14 ENCOUNTER — Emergency Department (HOSPITAL_COMMUNITY): Payer: Medicare Other

## 2018-03-14 ENCOUNTER — Emergency Department (HOSPITAL_COMMUNITY)
Admission: EM | Admit: 2018-03-14 | Discharge: 2018-03-14 | Disposition: A | Discharge status: Home / Self Care | Payer: Medicare Other | Attending: Emergency Medicine | Admitting: Emergency Medicine

## 2018-03-14 ENCOUNTER — Other Ambulatory Visit: Payer: Self-pay

## 2018-03-14 DIAGNOSIS — Z79899 Other long term (current) drug therapy: Secondary | ICD-10-CM | POA: Diagnosis not present

## 2018-03-14 DIAGNOSIS — Z7982 Long term (current) use of aspirin: Secondary | ICD-10-CM | POA: Diagnosis not present

## 2018-03-14 DIAGNOSIS — R0602 Shortness of breath: Secondary | ICD-10-CM | POA: Diagnosis not present

## 2018-03-14 DIAGNOSIS — I10 Essential (primary) hypertension: Secondary | ICD-10-CM | POA: Diagnosis not present

## 2018-03-14 DIAGNOSIS — J449 Chronic obstructive pulmonary disease, unspecified: Secondary | ICD-10-CM | POA: Diagnosis not present

## 2018-03-14 DIAGNOSIS — R197 Diarrhea, unspecified: Secondary | ICD-10-CM | POA: Diagnosis not present

## 2018-03-14 DIAGNOSIS — R112 Nausea with vomiting, unspecified: Secondary | ICD-10-CM | POA: Insufficient documentation

## 2018-03-14 DIAGNOSIS — F17210 Nicotine dependence, cigarettes, uncomplicated: Secondary | ICD-10-CM | POA: Insufficient documentation

## 2018-03-14 DIAGNOSIS — R109 Unspecified abdominal pain: Secondary | ICD-10-CM | POA: Diagnosis not present

## 2018-03-14 DIAGNOSIS — R110 Nausea: Secondary | ICD-10-CM

## 2018-03-14 LAB — URINALYSIS, ROUTINE W REFLEX MICROSCOPIC
BILIRUBIN URINE: NEGATIVE
GLUCOSE, UA: NEGATIVE mg/dL
Ketones, ur: NEGATIVE mg/dL
NITRITE: NEGATIVE
PROTEIN: NEGATIVE mg/dL
Specific Gravity, Urine: 1.014 (ref 1.005–1.030)
pH: 7 (ref 5.0–8.0)

## 2018-03-14 LAB — COMPREHENSIVE METABOLIC PANEL
ALK PHOS: 58 U/L (ref 38–126)
ALT: 32 U/L (ref 14–54)
ANION GAP: 12 (ref 5–15)
AST: 29 U/L (ref 15–41)
Albumin: 3.5 g/dL (ref 3.5–5.0)
BILIRUBIN TOTAL: 0.6 mg/dL (ref 0.3–1.2)
BUN: 11 mg/dL (ref 6–20)
CALCIUM: 9.3 mg/dL (ref 8.9–10.3)
CO2: 35 mmol/L — ABNORMAL HIGH (ref 22–32)
CREATININE: 0.74 mg/dL (ref 0.44–1.00)
Chloride: 94 mmol/L — ABNORMAL LOW (ref 101–111)
Glucose, Bld: 174 mg/dL — ABNORMAL HIGH (ref 65–99)
Potassium: 3.2 mmol/L — ABNORMAL LOW (ref 3.5–5.1)
Sodium: 141 mmol/L (ref 135–145)
TOTAL PROTEIN: 6.3 g/dL — AB (ref 6.5–8.1)

## 2018-03-14 LAB — CBC
HCT: 45.2 % (ref 36.0–46.0)
HEMOGLOBIN: 13.5 g/dL (ref 12.0–15.0)
MCH: 30.5 pg (ref 26.0–34.0)
MCHC: 29.9 g/dL — ABNORMAL LOW (ref 30.0–36.0)
MCV: 102 fL — ABNORMAL HIGH (ref 78.0–100.0)
PLATELETS: 203 10*3/uL (ref 150–400)
RBC: 4.43 MIL/uL (ref 3.87–5.11)
RDW: 14.1 % (ref 11.5–15.5)
WBC: 13 10*3/uL — AB (ref 4.0–10.5)

## 2018-03-14 LAB — LIPASE, BLOOD: Lipase: 36 U/L (ref 11–51)

## 2018-03-14 LAB — TROPONIN I: Troponin I: <0.03 ng/mL (ref <0.03)

## 2018-03-14 MED ORDER — MORPHINE SULFATE (PF) 4 MG/ML IV SOLN
4.0000 mg | INTRAVENOUS | Status: DC | PRN
  Administered 2018-03-14: 4 mg via INTRAVENOUS
  Filled 2018-03-14: qty 1

## 2018-03-14 MED ORDER — ONDANSETRON 4 MG PO TBDP: 4.0000 mg | ORAL_TABLET | Freq: Three times a day (TID) | ORAL | 0 refills | Status: DC | PRN

## 2018-03-14 MED ORDER — ALBUTEROL SULFATE (2.5 MG/3ML) 0.083% IN NEBU: 2.5000 mg | INHALATION_SOLUTION | RESPIRATORY_TRACT | Status: DC | PRN

## 2018-03-14 MED ORDER — ONDANSETRON HCL 4 MG/2ML IJ SOLN
4.0000 mg | Freq: Once | INTRAMUSCULAR | Status: AC
  Administered 2018-03-14: 4 mg via INTRAVENOUS
  Filled 2018-03-14: qty 2

## 2018-03-14 MED ORDER — PROMETHAZINE HCL 25 MG PO TABS: 25.0000 mg | ORAL_TABLET | Freq: Four times a day (QID) | ORAL | 0 refills | Status: DC | PRN

## 2018-03-14 NOTE — ED Provider Notes (Signed)
[facility] EMERGENCY DEPARTMENT Provider Note   CSN: [id number] Arrival date & time: 03/14/18  1013     History   Chief Complaint Chief Complaint  Patient presents with  . Abdominal Pain    HPI Cindy [name] is a 62 y.o. female.  Chief complaint is "I have been throwing up since 2 AM ".  HPI 62 year old female.  History of COPD home O2 dependent at 2 L.  Awakened this morning at 2 AM with nausea.  Said vomiting 4 times since then.  Still feels nausea.  Feels bloated.  No diarrhea.  Took Pepto an hour ago.  Helped a bit.  Some pain in her back.  No fevers.  Short of breath at baseline.  Has used nebulizer today.  Wheezing on arrival.  No fever.  No productive sputum.  No urinary symptoms.  Past Medical History:  Diagnosis Date  . 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 Arthritis   . Chronic respiratory failure ([place]) On home 02 2-3L  . Compression fracture of lumbar vertebra ([place])   . COPD (chronic obstructive pulmonary disease) ([place])   . Hyperglycemia, drug-induced   . Hyperlipidemia   . Kidney stones 2014   Left side, multiple  . Osteoporosis 2013  . Shortness of breath   . Thyroid disease    pt denies  . Tobacco abuse     Patient Active Problem List   Diagnosis Date Noted  . 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 Leg swelling 04/15/2016  . Rash and nonspecific skin eruption 08/15/2014  . Ureteral stone with hydronephrosis 05/30/2013  . Allergic rhinitis 04/11/2013  . Ecchymosis 04/11/2013  . Kidney stones 04/11/2013  . Lumbar back pain 03/03/2013  . Compression fracture of L3 lumbar vertebra ([place]) 03/03/2013  . Unspecified constipation 03/03/2013  . Osteopenia 03/16/2012  . GERD (gastroesophageal reflux disease) 03/16/2012  . Thrush 03/02/2012  . Chronic hypoxemic respiratory failure ([place]) 02/27/2012  . Hyperlipidemia 02/25/2012  . Hyperglycemia, drug-induced 09/25/2011  . Tobacco abuse 09/24/2011  . COPD (chronic obstructive pulmonary disease) ([place])  12/24/2009    Past Surgical History:  Procedure Laterality Date  . CERVICAL BIOPSY    . COLONOSCOPY N/A 03/31/2013   Procedure: COLONOSCOPY;  Surgeon: Rogene Houston, MD;  Location: [facility] ENDO SUITE;  Service: Endoscopy;  Laterality: N/A;  730  . TUBAL LIGATION      OB History    No data available       Home Medications    Prior to Admission medications   Medication Sig Start Date End Date Taking? Authorizing Provider  albuterol (PROVENTIL HFA;VENTOLIN HFA) 108 (90 Base) MCG/ACT inhaler Inhale 2 puffs into the lungs every 6 (six) hours as needed for wheezing or shortness of breath. 02/25/18  Yes Collene Gobble, MD  albuterol (PROVENTIL) (2.5 MG/3ML) 0.083% nebulizer solution Take 3 mLs (2.5 mg total) by nebulization every 4 (four) hours as needed for wheezing or shortness of breath. 10/13/17 02/23/20 Yes [place], Modena Nunnery, MD  alendronate (FOSAMAX) 70 MG tablet take 1 tablet by mouth every week on an empty stomach with 6 12/21/17  Yes [place], Modena Nunnery, MD  aspirin EC 81 MG tablet Take 81 mg by mouth every morning.    Yes [provider]  Calcium Carb-Cholecalciferol 600-800 MG-UNIT TABS Take 2 tablets by mouth daily.    Yes [provider]  diphenhydrAMINE (BENADRYL) 12.5 MG/5ML liquid Take 6.25 mg by mouth 4 (four) times daily as needed.   Yes [provider]  furosemide (LASIX) 20 MG tablet Take 1 tablet (20 mg total) by mouth daily. 10/13/17  Yes [place], Modena Nunnery, MD  guaiFENesin-codeine 100-10 MG/5ML syrup Take 5 mLs by mouth 3 (three) times daily as needed for cough. 03/05/18  Yes [place], Modena Nunnery, MD  nystatin (MYCOSTATIN) 100000 UNIT/ML suspension Use as directed 5 mLs (500,000 Units total) in the mouth or throat 4 (four) times daily. 01/29/18  Yes [place], Modena Nunnery, MD  oxyCODONE-acetaminophen (PERCOCET/ROXICET) 5-325 MG tablet take 1 tablet by mouth every 8 hours if needed for pain 12/24/17  Yes Dena Billet B, PA-C  OXYGEN Inhale 2.5 L into the lungs  continuous.    Yes [provider]  pantoprazole (PROTONIX) 40 MG tablet TAKE 1 TABLET BY MOUTH ONCE DAILY 03/04/18  Yes [place], Modena Nunnery, MD  predniSONE (DELTASONE) 20 MG tablet Take 60mg  x3days,40mg  x3days,20mg  x3days,then resume 10mg  daily Patient taking differently: On 10 mg daily 01/29/18  Yes [place], Modena Nunnery, MD  Roflumilast (DALIRESP) 250 MCG TABS Take 1 tablet by mouth daily. 02/25/18  Yes Collene Gobble, MD  simvastatin (ZOCOR) 40 MG tablet Take 1 tablet (40 mg total) by mouth at bedtime. 04/09/17  Yes [place], Modena Nunnery, MD  SYMBICORT 160-4.5 MCG/ACT inhaler inhale 2 puffs by mouth twice a day 05/11/17  Yes Susy Frizzle, MD  Tiotropium Bromide Monohydrate (SPIRIVA RESPIMAT) 2.5 MCG/ACT AERS INHALE 2 PUFFS INTO THE LUNGS ONCE DAILY 03/02/18  Yes [place], Modena Nunnery, MD  ondansetron (ZOFRAN ODT) 4 MG disintegrating tablet Take 1 tablet (4 mg total) by mouth every 8 (eight) hours as needed for nausea. 03/14/18   Tanna Furry, MD    Family History Family History  Problem Relation Age of Onset  . Heart disease Mother   . Hypertension Sister   . Hypertension Brother     Social History Social History   Tobacco Use  . Smoking status: Current Every Day Smoker    Packs/day: 1.00    Years: 25.00    Pack years: 25.00    Types: Cigarettes  . Smokeless tobacco: Never Used  Substance Use Topics  . Alcohol use: No  . Drug use: No     Allergies   Keflex [cephalexin]; Doxycycline; Sulfa antibiotics; Avelox [moxifloxacin hcl in nacl]; and Toradol [ketorolac tromethamine]   Review of Systems Review of Systems  Constitutional: Negative for appetite change, chills, diaphoresis, fatigue and fever.  HENT: Negative for mouth sores, sore throat and trouble swallowing.   Eyes: Negative for visual disturbance.  Respiratory: Positive for shortness of breath. Negative for cough, chest tightness and wheezing.   Cardiovascular: Negative for chest pain.  Gastrointestinal: Positive for  abdominal pain, nausea and vomiting. Negative for abdominal distention and diarrhea.  Endocrine: Negative for polydipsia, polyphagia and polyuria.  Genitourinary: Negative for dysuria, frequency and hematuria.  Musculoskeletal: Negative for gait problem.  Skin: Negative for color change, pallor and rash.  Neurological: Negative for dizziness, syncope, light-headedness and headaches.  Hematological: Does not bruise/bleed easily.  Psychiatric/Behavioral: Negative for behavioral problems and confusion.     Physical Exam Updated Vital Signs BP 138/69 (BP Location: Right Arm)   Pulse 84   Temp 99.2 F (37.3 C) (Oral)   Resp 17   Ht 5\' 2"  (1.575 m)   Wt 89.8 kg (198 lb)   SpO2 96%   BMI 36.21 kg/m   Physical Exam  Constitutional: She is oriented to person, place, and time. No distress.  Appears chronically ill.  No distress.  HENT:  Head: Normocephalic.  Conjunctive are not pale.  No scleral icterus.  Mucous membranes moist.  Eyes: Conjunctivae are normal. Pupils are equal, round, and reactive to light. No scleral icterus.  Neck: Normal range of motion. Neck supple. No thyromegaly present.  Cardiovascular: Normal rate and regular rhythm. Exam reveals no gallop and no friction rub.  No murmur heard. Sinus rhythm.  Not tachycardic.  Pulmonary/Chest: No respiratory distress. She has no wheezes. She has no rales.  Wheezing and prolongation.  93% saturations.  Abdominal: Soft. Bowel sounds are normal. She exhibits no distension. There is no tenderness. There is no rebound.  Protuberant abdomen.  No peritoneal irritation.  Bowel sounds present.  No focal tenderness.  Musculoskeletal: Normal range of motion.  Neurological: She is alert and oriented to person, place, and time.  Skin: Skin is warm and dry. No rash noted.  No peripheral edema.  Psychiatric: She has a normal mood and affect. Her behavior is normal.     ED Treatments / Results  Labs (all labs ordered are listed, but  only abnormal results are displayed) Labs Reviewed  COMPREHENSIVE METABOLIC PANEL - Abnormal; Notable for the following components:      Result Value   Potassium 3.2 (*)    Chloride 94 (*)    CO2 35 (*)    Glucose, Bld 174 (*)    Total Protein 6.3 (*)    All other components within normal limits  CBC - Abnormal; Notable for the following components:   WBC 13.0 (*)    MCV 102.0 (*)    MCHC 29.9 (*)    All other components within normal limits  URINALYSIS, ROUTINE W REFLEX MICROSCOPIC - Abnormal; Notable for the following components:   APPearance HAZY (*)    Hgb urine dipstick SMALL (*)    Leukocytes, UA SMALL (*)    Bacteria, UA RARE (*)    Squamous Epithelial / LPF 0-5 (*)    All other components within normal limits  LIPASE, BLOOD  TROPONIN I    EKG  EKG Interpretation  Date/Time:  Sunday March 14 2018 12:38:57 EDT Ventricular Rate:  110 PR Interval:    QRS Duration: 93 QT Interval:  341 QTC Calculation: 462 R Axis:   126 Text Interpretation:  Sinus tachycardia Ventricular premature complex Aberrant conduction of SV complex(es) Right axis deviation Abnormal R-wave progression, early transition Confirmed by Tanna Furry [phone]) on 03/14/2018 12:50:11 PM       Radiology Dg Chest Port 1 View  Result Date: 03/14/2018 CLINICAL DATA:  SOB, abd pain, nausea, vomiting, abd distension, all started this morning EXAM: PORTABLE CHEST 1 VIEW COMPARISON:  04/12/2017 FINDINGS: Normal mediastinum and cardiac silhouette. Normal pulmonary vasculature. No evidence of effusion, infiltrate, or pneumothorax. No acute bony abnormality. IMPRESSION: No acute cardiopulmonary process. Electronically Signed   By: Suzy Bouchard M.D.   On: 03/14/2018 11:38    Procedures Procedures (including critical care time)  Medications Ordered in ED Medications  morphine 4 MG/ML injection 4 mg (4 mg Intravenous Given 03/14/18 1235)  albuterol (PROVENTIL) (2.5 MG/3ML) 0.083% nebulizer solution 2.5 mg (not  administered)  ondansetron (ZOFRAN) injection 4 mg (4 mg Intravenous Given 03/14/18 1235)     Initial Impression / Assessment and Plan / ED Course  I have reviewed the triage vital signs and the nursing notes.  Pertinent labs & imaging results that were available during my care of the patient were reviewed by me and considered in my medical decision making (see  chart for details).   Plan chest x-ray breathing treatment.  IV, Zofran, morphine.  Labs.  Urine.  Reevaluation.  Patient with improvement of symptoms.  Is hungry and thirsty.  Is taking some p.o.  Did not require additional pain medications.  I think her symptoms are likely secondary to gastroenteritis and intestinal colic.  Plan is home.  Clear liquids.  Return here worsening pain, worsening symptoms, bloody stools, fever.  Zofran PRN  Final Clinical Impressions(s) / ED Diagnoses   Final diagnoses:  Nausea  Nausea vomiting and diarrhea    ED Discharge Orders        Ordered    ondansetron (ZOFRAN ODT) 4 MG disintegrating tablet  Every 8 hours PRN     03/14/18 1414       Tanna Furry, MD 03/14/18 1416

## 2018-03-14 NOTE — Discharge Instructions (Signed)
Clear liquids.  Slowly advance your diet as your symptoms improve.

## 2018-03-14 NOTE — ED Triage Notes (Addendum)
Patient c/o generalized abd pain that started this morning with nausea, vomiting, chills, and abd bloating. Denies any diarrhea or fevers. Denies any GI hx. Last normal BM yesterday-no blood noted. Emesis partially undigested food. Per patient radiates into left flank/back.

## 2018-03-25 ENCOUNTER — Other Ambulatory Visit: Payer: Self-pay | Admitting: Emergency Medicine

## 2018-04-05 ENCOUNTER — Encounter: Payer: Self-pay | Admitting: Physician Assistant

## 2018-04-05 ENCOUNTER — Ambulatory Visit (INDEPENDENT_AMBULATORY_CARE_PROVIDER_SITE_OTHER): Payer: Medicare Other | Admitting: Physician Assistant

## 2018-04-05 VITALS — BP 134/80 | HR 94 | Temp 97.7°F | Resp 18 | Ht 62.0 in | Wt 201.0 lb

## 2018-04-05 DIAGNOSIS — J441 Chronic obstructive pulmonary disease with (acute) exacerbation: Secondary | ICD-10-CM

## 2018-04-05 MED ORDER — AMOXICILLIN-POT CLAVULANATE 875-125 MG PO TABS
1.0000 | ORAL_TABLET | Freq: Two times a day (BID) | ORAL | 0 refills | Status: AC
Start: 2018-04-05 — End: 2018-04-12

## 2018-04-05 MED ORDER — METHYLPREDNISOLONE ACETATE 80 MG/ML IJ SUSP
80.0000 mg | Freq: Once | INTRAMUSCULAR | Status: AC
Start: 2018-04-05 — End: 2018-04-05
  Administered 2018-04-05: 80 mg via INTRAMUSCULAR

## 2018-04-05 MED ORDER — IPRATROPIUM-ALBUTEROL 0.5-2.5 (3) MG/3ML IN SOLN
3.0000 mL | Freq: Once | RESPIRATORY_TRACT | Status: AC
  Administered 2018-04-05: 3 mL via RESPIRATORY_TRACT

## 2018-04-05 MED ORDER — PREDNISONE 20 MG PO TABS: ORAL_TABLET | ORAL | 0 refills | Status: DC

## 2018-04-05 NOTE — Addendum Note (Signed)
Addended by: Vonna Kotyk A on: 04/05/2018 12:36 PM   Modules accepted: Orders

## 2018-04-05 NOTE — Progress Notes (Signed)
Patient ID: Cindy [name] MRN: [id number], DOB: 02-13-1956, 62 y.o. Date of Encounter: @DATE @  Chief Complaint:  Chief Complaint  Patient presents with  .   Marland Kitchen Trouble breathing    HPI: 62 y.o. year old female  presents with above.    She reports that she has been having difficulty/ "hard time for a month now."  "When [she] gets up moving at all her oxygen level goes down and she has to sit still.  Says that just to go from the couch to the bathroom, she has to rush back to her oxygen concentrator."  Says that she does use her portable oxygen, but even with that--her oxygen level dropped to the 80s-- so she has to hurry back to the couch to get back on the oxygen concentrator."  Today at the end of the visit she says "and do not give me that Z-Pak---- that thing that just last a couple days--- that never works"  Reports that she has been using all pulmonary medicines as directed including her nasal cannula oxygen.  Is taking Spiriva Symbicort and prednisone 10 mg daily.  Also Daliresp.  Today I reviewed her last note with pulmonary 02/25/18. Today I also reviewed her note with Dr. Buelah Manis here 01/29/18 with COPD exacerbation and also her note with me 12/24/2017 with COPD exacerbation.    Past Medical History:  Diagnosis Date  . 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 Arthritis   . Chronic respiratory failure ([place]) On home 02 2-3L  . Compression fracture of lumbar vertebra ([place])   . COPD (chronic obstructive pulmonary disease) ([place])   . Hyperglycemia, drug-induced   . Hyperlipidemia   . Kidney stones 2014   Left side, multiple  . Osteoporosis 2013  . Shortness of breath   . Thyroid disease    pt denies  . Tobacco abuse      Home Meds: Outpatient Medications Prior to Visit  Medication Sig Dispense Refill  . albuterol (PROVENTIL HFA;VENTOLIN HFA) 108 (90 Base) MCG/ACT inhaler Inhale 2 puffs into the lungs every 6 (six) hours as needed for wheezing or shortness of breath. 1 Inhaler 5   . albuterol (PROVENTIL) (2.5 MG/3ML) 0.083% nebulizer solution Take 3 mLs (2.5 mg total) by nebulization every 4 (four) hours as needed for wheezing or shortness of breath. 75 mL 11  . alendronate (FOSAMAX) 70 MG tablet take 1 tablet by mouth every week on an empty stomach with 6 4 tablet 6  . aspirin EC 81 MG tablet Take 81 mg by mouth every morning.     . Calcium Carb-Cholecalciferol 600-800 MG-UNIT TABS Take 2 tablets by mouth daily.     . diphenhydrAMINE (BENADRYL) 12.5 MG/5ML liquid Take 6.25 mg by mouth 4 (four) times daily as needed.    . furosemide (LASIX) 20 MG tablet Take 1 tablet (20 mg total) by mouth daily. 30 tablet 6  . nystatin (MYCOSTATIN) 100000 UNIT/ML suspension Use as directed 5 mLs (500,000 Units total) in the mouth or throat 4 (four) times daily. 240 mL 3  . oxyCODONE-acetaminophen (PERCOCET/ROXICET) 5-325 MG tablet take 1 tablet by mouth every 8 hours if needed for pain 30 tablet 0  . OXYGEN Inhale 2.5 L into the lungs continuous.     . pantoprazole (PROTONIX) 40 MG tablet TAKE 1 TABLET BY MOUTH ONCE DAILY 90 tablet 0  . predniSONE (DELTASONE) 10 MG tablet TAKE 1 TABLET BY MOUTH EVERY MORNING WITH FOOD 30 tablet 2  . Roflumilast (  DALIRESP) 250 MCG TABS Take 1 tablet by mouth daily. 30 tablet 5  . simvastatin (ZOCOR) 40 MG tablet Take 1 tablet (40 mg total) by mouth at bedtime. 90 tablet 3  . SYMBICORT 160-4.5 MCG/ACT inhaler inhale 2 puffs by mouth twice a day 10.2 g 11  . Tiotropium Bromide Monohydrate (SPIRIVA RESPIMAT) 2.5 MCG/ACT AERS INHALE 2 PUFFS INTO THE LUNGS ONCE DAILY 4 g 11  . guaiFENesin-codeine 100-10 MG/5ML syrup Take 5 mLs by mouth 3 (three) times daily as needed for cough. 180 mL 0  . ondansetron (ZOFRAN ODT) 4 MG disintegrating tablet Take 1 tablet (4 mg total) by mouth every 8 (eight) hours as needed for nausea. 6 tablet 0  . predniSONE (DELTASONE) 20 MG tablet Take 60mg  x3days,40mg  x3days,20mg  x3days,then resume 10mg  daily (Patient not taking: Reported  on 04/05/2018) 18 tablet 0  . promethazine (PHENERGAN) 25 MG tablet Take 1 tablet (25 mg total) by mouth every 6 (six) hours as needed for nausea or vomiting. (Patient not taking: Reported on 04/05/2018) 10 tablet 0   No facility-administered medications prior to visit.     Allergies:  Allergies  Allergen Reactions  . Keflex [Cephalexin] Anaphylaxis and Rash  . Doxycycline Other (See Comments)    Gave patient oral thrush  . Sulfa Antibiotics   . Avelox [Moxifloxacin Hcl In Nacl] Itching and Rash  . Toradol [Ketorolac Tromethamine] Swelling, Rash and Other (See Comments)    Bruising, swelling, rash at injection site    Social History   Socioeconomic History  . Marital status: Divorced    Spouse name: Not on file  . Number of children: 3  . Years of education: Not on file  . Highest education level: Not on file  Occupational History  . Occupation: disabled    Employer: DISABLED  Social Needs  . Financial resource strain: Not on file  . Food insecurity:    Worry: Not on file    Inability: Not on file  . Transportation needs:    Medical: Not on file    Non-medical: Not on file  Tobacco Use  . Smoking status: Current Every Day Smoker    Packs/day: 1.00    Years: 25.00    Pack years: 25.00    Types: Cigarettes  . Smokeless tobacco: Never Used  Substance and Sexual Activity  . Alcohol use: No  . Drug use: No  . Sexual activity: Not Currently    Birth control/protection: Surgical  Lifestyle  . Physical activity:    Days per week: Not on file    Minutes per session: Not on file  . Stress: Not on file  Relationships  . Social connections:    Talks on phone: Not on file    Gets together: Not on file    Attends religious service: Not on file    Active member of club or organization: Not on file    Attends meetings of clubs or organizations: Not on file    Relationship status: Not on file  . Intimate partner violence:    Fear of current or ex partner: Not on file     Emotionally abused: Not on file    Physically abused: Not on file    Forced sexual activity: Not on file  Other Topics Concern  . Not on file  Social History Narrative  . Not on file    Family History  Problem Relation Age of Onset  . Heart disease Mother   . Hypertension Sister   .  Hypertension Brother      Review of Systems:  See HPI for pertinent ROS. All other ROS negative.   Physical Exam Blood pressure 134/80, pulse 94, temperature 97.7 F (36.5 C), temperature source Oral, resp. rate 18, height 5\' 2"  (1.575 m), weight 91.2 kg (201 lb), SpO2 96 %., Body mass index is 36.76 kg/m. General: WF. Has [place] Oxygen . Appears in no acute distress. Head: Normocephalic, atraumatic, eyes without discharge, sclera non-icteric, nares are without discharge. Bilateral auditory canals clear, TM's are without perforation, pearly grey and translucent with reflective cone of light bilaterally. Oral cavity moist, posterior pharynx without exudate, erythema, peritonsillar abscess.  Neck: Supple. No thyromegaly. No lymphadenopathy. Lungs: Moderate wheezes throughout bilaterally. Heart: RRR with S1 S2. No murmurs, rubs, or gallops. Musculoskeletal:  Strength and tone normal for age. Extremities/Skin: Warm and dry.  Neuro: Alert and oriented X 3. Moves all extremities spontaneously. Gait is normal. CNII-XII grossly in tact. Psych:  Responds to questions appropriately with a normal affect.     ASSESSMENT AND PLAN:  62 y.o. year old female with  1. COPD exacerbation ([place]) In the office we are giving Depo-Medrol 80 mg IM and a DuoNeb treatment. She will go ahead and start the Augmentin today.  She will start this oral prednisone taper tomorrow when she returns this will then return to 10 mg daily. If breathing worsens, go to ER.  Follow-up here if needed/if  indicated. - amoxicillin-clavulanate (AUGMENTIN) 875-125 MG tablet; Take 1 tablet by mouth 2 (two) times daily for 7 days.  Dispense: 14  tablet; Refill: 0 - predniSONE (DELTASONE) 20 MG tablet; Take 60mg  x3days,40mg  x3days,20mg  x3days,then resume 10mg  daily  Dispense: 18 tablet; Refill: 0   Signed, [address] [place], [place], [facility] 04/05/2018 10:58 AM

## 2018-04-26 ENCOUNTER — Telehealth: Payer: Self-pay | Admitting: Family Medicine

## 2018-04-26 ENCOUNTER — Other Ambulatory Visit: Payer: Self-pay | Admitting: Family Medicine

## 2018-04-26 MED ORDER — BUDESONIDE-FORMOTEROL FUMARATE 160-4.5 MCG/ACT IN AERO: 2.0000 | INHALATION_SPRAY | Freq: Two times a day (BID) | RESPIRATORY_TRACT | 11 refills | Status: DC

## 2018-04-26 MED ORDER — SIMVASTATIN 40 MG PO TABS: 40.0000 mg | ORAL_TABLET | Freq: Every day | ORAL | 3 refills | Status: DC

## 2018-04-26 MED ORDER — METHOCARBAMOL 500 MG PO TABS: 500.0000 mg | ORAL_TABLET | Freq: Four times a day (QID) | ORAL | 1 refills | Status: DC

## 2018-04-26 NOTE — Telephone Encounter (Signed)
Prescriptions sent to pharmacy for Zocor and Symbicort.   Ok to refill Robaxin?

## 2018-04-26 NOTE — Telephone Encounter (Signed)
okay

## 2018-04-26 NOTE — Telephone Encounter (Signed)
Prescription sent to pharmacy.

## 2018-04-26 NOTE — Telephone Encounter (Signed)
Pt needs refill simvastatin and robaxin, and symbicort to [facility]

## 2018-04-27 ENCOUNTER — Other Ambulatory Visit: Payer: Self-pay | Admitting: *Deleted

## 2018-04-27 ENCOUNTER — Telehealth: Payer: Self-pay | Admitting: Family Medicine

## 2018-04-27 DIAGNOSIS — J441 Chronic obstructive pulmonary disease with (acute) exacerbation: Secondary | ICD-10-CM

## 2018-04-27 MED ORDER — TIOTROPIUM BROMIDE MONOHYDRATE 2.5 MCG/ACT IN AERS: INHALATION_SPRAY | RESPIRATORY_TRACT | 11 refills | Status: DC

## 2018-04-27 MED ORDER — BUDESONIDE-FORMOTEROL FUMARATE 160-4.5 MCG/ACT IN AERO: 2.0000 | INHALATION_SPRAY | Freq: Two times a day (BID) | RESPIRATORY_TRACT | 11 refills | Status: DC

## 2018-04-27 MED ORDER — METHOCARBAMOL 500 MG PO TABS: 500.0000 mg | ORAL_TABLET | Freq: Four times a day (QID) | ORAL | 1 refills | Status: DC

## 2018-04-27 MED ORDER — SIMVASTATIN 40 MG PO TABS: 40.0000 mg | ORAL_TABLET | Freq: Every day | ORAL | 3 refills | Status: DC

## 2018-04-27 MED ORDER — GUAIFENESIN-CODEINE 100-10 MG/5ML PO SOLN: 5.0000 mL | Freq: Four times a day (QID) | ORAL | 0 refills | Status: DC | PRN

## 2018-04-27 NOTE — Telephone Encounter (Signed)
Medication refilled

## 2018-04-27 NOTE — Telephone Encounter (Signed)
Patient would like her guaffisin? Cough syrup called into [facility]  For her congestion If any questions call [phone]

## 2018-04-27 NOTE — Telephone Encounter (Signed)
MD please advise

## 2018-04-29 ENCOUNTER — Ambulatory Visit (INDEPENDENT_AMBULATORY_CARE_PROVIDER_SITE_OTHER): Payer: Medicare Other | Admitting: Emergency Medicine

## 2018-04-29 ENCOUNTER — Encounter: Payer: Self-pay | Admitting: Emergency Medicine

## 2018-04-29 DIAGNOSIS — K219 Gastro-esophageal reflux disease without esophagitis: Secondary | ICD-10-CM

## 2018-04-29 DIAGNOSIS — J411 Mucopurulent chronic bronchitis: Secondary | ICD-10-CM

## 2018-04-29 MED ORDER — METHYLPREDNISOLONE ACETATE 80 MG/ML IJ SUSP
120.0000 mg | Freq: Once | INTRAMUSCULAR | Status: AC
  Administered 2018-04-29: 120 mg via INTRAMUSCULAR

## 2018-04-29 MED ORDER — PREDNISONE 10 MG PO TABS
ORAL_TABLET | ORAL | 5 refills | Status: DC
Start: 2018-04-29 — End: 2018-08-21

## 2018-04-29 MED ORDER — PANTOPRAZOLE SODIUM 40 MG PO TBEC
40.0000 mg | DELAYED_RELEASE_TABLET | Freq: Two times a day (BID) | ORAL | 5 refills | Status: DC
Start: 2018-04-29 — End: 2018-06-09

## 2018-04-29 NOTE — Progress Notes (Signed)
Subjective:    Patient ID: Cindy [name], female    DOB: 29-Aug-1956, 62 y.o.   MRN: [id number]  HPI  ROV 06/26/17 -- Mrs. Garciaperez his severe COPD with persistent symptoms in the setting of continued tobacco abuse. She has chronic hypoxemic respiratory failure with very frequent exacerbations. She has been in the emergency rooms since our last visit. She was also seen by Dr. Buelah Manis on 6/25. She was treated with depomedrol, pred taper back down to 10mg . Azithromycin. She remains on symbicort and spiriva. Using albuterol prn, about once a day. Her oxygen is at 3L/min. She is smoking  . She is not on flonase right now, uses prn. She is interested in a POC  ROV 10/01/17 -- Patient has a history of continued tobacco use, severe COPD with persistent daily symptoms, frequent exacerbations. Currently on Spiriva and Symbicort. She is maintained on pred 10mg . Her last exacerbation was 9/20, treated with steroids and Augmentin. She notes B LE swelling, concerned that the pred is a contributor.  Using lasix prn based on her swelling and wt. She remains very limited, wears o2 at 2.5L/min. She is using albuterol about once a day. She remains on protonix and flonase. Discussed life-support, issues surrounding goals of care with her today. She is going to think about this.  ROV 02/25/18 --this is a follow-up visit.  62 year old woman with a history of severe COPD, chronic cough with dyspnea, continued tobacco use.  We have been managing her on prednisone 10 mg daily, Spiriva, Symbicort.  She has continued to have recurrent symptoms, exacerbations about monthly. Last pred taper and abx was early February. She believes that we tried daliresp at some point in the past. She continues to have exertional SOB. She is coughing up clear mucous. Albuterol nebs about 2x a day.    ROV 04/29/18 --62 year old smoker with a history of severe COPD, chronic cough with dyspnea.  She has chronic hypoxemic respiratory failure and is prednisone  dependent.  She has frequent exacerbations that are mostly bronchitic in nature.  She was treated for an acute exacerbation 4/8 by her primary care office.  She returns today reporting that she is having LE swelling, B calf pain, increased dyspnea and chest tightness. She is wearing O2 at 3L/min. Uses Spiriva, Symbicort, pred 10mg  daily. We tried to start daliresp but it was not covered by her insurance. She is having breakthrough GERD, especially at night, on protonix.  She continues to smoke 1 pack a day   PULMONARY FUNCTON TEST 09/27/2012  FVC 2.31  FEV1 .87  FEV1/FVC 37.7  FVC  % Predicted 76  FEV % Predicted 38  FeF 25-75 .3  FeF 25-75 % Predicted 2.65       Objective:   Physical Exam Vitals:   04/29/18 1027  BP: 140/80  Pulse: (!) 114  SpO2: 90%  Weight: 199 lb (90.3 kg)   Gen: Pleasant, Obese woman, in no distress,   ENT: No lesions,  mouth clear,  oropharynx clear, no postnasal drip  Neck: No JVD, noStridor  Lungs: No use of accessory muscles, diffuse bilateral wheezes.   Cardiovascular: RRR, heart sounds normal, no murmur or gallops, 1+ lower ext peripheral edema  Musculoskeletal: No deformities, no cyanosis or clubbing  Neuro: alert, non focal  Skin: Warm, no lesions or rashes     Assessment & Plan:  COPD (chronic obstructive pulmonary disease) We will increase your prednisone to 20 mg daily Depo-medrol shot given today Continue your Spiriva, Symbicort as  you are taking them Your insurance company would not cover [place].  We will not be able to start this medication. We will increase Protonix to 40 mg twice a day.  Please take this medication 1 hour before eating. Continue your oxygen as you are using.  You need to decrease and ultimately stop her smoking.  Your lung disease is going to continue to progress, your breathing is going to continue to worsen as long as you are smoking.  No change or increase in medication is going to change this worsening.  Ultimately lung disease will take your life if you cannot stop.  Follow with Dr Lamonte Sakai in 1 month  Baltazar Apo, MD, PhD 04/29/2018, 10:54 AM [facility] [phone] or if no answer [phone]

## 2018-04-29 NOTE — Assessment & Plan Note (Signed)
We will increase your prednisone to 20 mg daily Depo-medrol shot given today Continue your Spiriva, Symbicort as you are taking them Your insurance company would not cover [place].  We will not be able to start this medication. We will increase Protonix to 40 mg twice a day.  Please take this medication 1 hour before eating. Continue your oxygen as you are using.  You need to decrease and ultimately stop her smoking.  Your lung disease is going to continue to progress, your breathing is going to continue to worsen as long as you are smoking.  No change or increase in medication is going to change this worsening. Ultimately lung disease will take your life if you cannot stop.  Follow with Dr Lamonte Sakai in 1 month

## 2018-04-29 NOTE — Patient Instructions (Addendum)
We will increase your prednisone to 20 mg daily Depo-medrol shot given today Continue your Spiriva, Symbicort as you are taking them Your insurance company would not cover [place].  We will not be able to start this medication. We will increase Protonix to 40 mg twice a day.  Please take this medication 1 hour before eating. Continue your oxygen as you are using.  You need to decrease and ultimately stop her smoking.  Your lung disease is going to continue to progress, your breathing is going to continue to worsen as long as you are smoking.  No change or increase in medication is going to change this worsening. Ultimately lung disease will take your life if you cannot stop.  Follow with Dr Lamonte Sakai in 1 month

## 2018-05-14 ENCOUNTER — Telehealth: Payer: Self-pay | Admitting: Emergency Medicine

## 2018-05-14 NOTE — Telephone Encounter (Signed)
Ok to reduce prednisone but if breathing worse then increase back up and call dr byrum for further instructions

## 2018-05-14 NOTE — Telephone Encounter (Signed)
Called and spoke with patient, she states that when she takes the 20mg  of prednisone it makes her heart beat go up to 109 beats per minute and her legs are swelling. She is wanting to switch back down to 10mg .   RB please advise, thank you.

## 2018-05-14 NOTE — Telephone Encounter (Signed)
RB is not here so DOD has to review. Please advise.

## 2018-05-14 NOTE — Telephone Encounter (Signed)
Called and spoke with patient, she is aware of response and verbalized understanding. Nothing further needed at this time.

## 2018-05-14 NOTE — Telephone Encounter (Signed)
Routing this to MW since RB is out of the office.

## 2018-05-18 ENCOUNTER — Telehealth: Payer: Self-pay | Admitting: Emergency Medicine

## 2018-05-18 MED ORDER — ALBUTEROL SULFATE HFA 108 (90 BASE) MCG/ACT IN AERS: 2.0000 | INHALATION_SPRAY | Freq: Four times a day (QID) | RESPIRATORY_TRACT | 5 refills | Status: DC | PRN

## 2018-05-18 NOTE — Telephone Encounter (Signed)
Called and spoke to patient. Refill for albuterol inhaler sent to pharmacy. Let patient know that we will call her back when we hear back from RB about cough medication and dialaresp.

## 2018-05-18 NOTE — Telephone Encounter (Signed)
Patient called back and is very anxious about getting her Proair today from [place].  She hung up while I was speaking to [facility] before I could get a call back number from her.

## 2018-05-18 NOTE — Telephone Encounter (Signed)
Called and spoke with patient about med request.  Patient stated that someone from pharmacy had called and told her that [place] is covered by insurance. Patient stated that she would like a cough medication for chest and head congestion.  Patient stated that she had been taking Motrin and OTC allergy medication.  Called [facility] and spoke with [place].  She stated that Deliresp is covered by her insurance if ordered 500mg  for 30 days.  Callie stated that fax was sent today.   RB please advise

## 2018-05-20 NOTE — Telephone Encounter (Signed)
Please have DOD sign tomorrow.

## 2018-05-20 NOTE — Telephone Encounter (Signed)
OK to give her hycodan 5cc q6h prn cough I'd like to discuss the daliresp w her at next OV

## 2018-05-21 ENCOUNTER — Other Ambulatory Visit: Payer: Self-pay | Admitting: Family Medicine

## 2018-05-21 ENCOUNTER — Telehealth: Payer: Self-pay | Admitting: Emergency Medicine

## 2018-05-21 DIAGNOSIS — M545 Low back pain, unspecified: Secondary | ICD-10-CM

## 2018-05-21 MED ORDER — OXYCODONE-ACETAMINOPHEN 5-325 MG PO TABS: ORAL_TABLET | ORAL | 0 refills | Status: DC

## 2018-05-21 MED ORDER — HYDROCODONE-HOMATROPINE 5-1.5 MG/5ML PO SYRP: 5.0000 mL | ORAL_SOLUTION | Freq: Four times a day (QID) | ORAL | 0 refills | Status: DC | PRN

## 2018-05-21 NOTE — Telephone Encounter (Signed)
That's fine

## 2018-05-21 NOTE — Telephone Encounter (Signed)
Dr. Melvyn Novas please advise if you are willing to sign Hycodan rx (RB approved) in lieu of RB not being in office. Thanks.

## 2018-05-21 NOTE — Telephone Encounter (Signed)
error 

## 2018-05-21 NOTE — Telephone Encounter (Signed)
Ok to refill??  Last office visit 04/05/2018.  Last refill 12/24/2017.

## 2018-05-21 NOTE — Telephone Encounter (Signed)
Pt needs refill on oxycodone to [facility] only has 1 pill left.

## 2018-05-21 NOTE — Telephone Encounter (Signed)
Hycodan prescription printed and signed by Dr Melvyn Novas, placed in taped envelope.  Enveloped placed in folder at front desk.  Patient notified and stated understanding. Nothing further needed at this time.

## 2018-05-25 ENCOUNTER — Emergency Department (HOSPITAL_COMMUNITY): Payer: Medicare Other

## 2018-05-25 ENCOUNTER — Emergency Department (HOSPITAL_COMMUNITY)
Admission: EM | Admit: 2018-05-25 | Discharge: 2018-05-25 | Disposition: A | Discharge status: Home / Self Care | Payer: Medicare Other | Attending: Emergency Medicine | Admitting: Emergency Medicine

## 2018-05-25 ENCOUNTER — Encounter (HOSPITAL_COMMUNITY): Payer: Self-pay | Admitting: *Deleted

## 2018-05-25 DIAGNOSIS — J441 Chronic obstructive pulmonary disease with (acute) exacerbation: Secondary | ICD-10-CM | POA: Diagnosis not present

## 2018-05-25 DIAGNOSIS — M7122 Synovial cyst of popliteal space [Baker], left knee: Secondary | ICD-10-CM | POA: Insufficient documentation

## 2018-05-25 DIAGNOSIS — M545 Low back pain, unspecified: Secondary | ICD-10-CM

## 2018-05-25 DIAGNOSIS — Z79899 Other long term (current) drug therapy: Secondary | ICD-10-CM | POA: Insufficient documentation

## 2018-05-25 DIAGNOSIS — F17210 Nicotine dependence, cigarettes, uncomplicated: Secondary | ICD-10-CM | POA: Insufficient documentation

## 2018-05-25 DIAGNOSIS — R05 Cough: Secondary | ICD-10-CM | POA: Diagnosis not present

## 2018-05-25 DIAGNOSIS — R600 Localized edema: Secondary | ICD-10-CM

## 2018-05-25 DIAGNOSIS — G8929 Other chronic pain: Secondary | ICD-10-CM | POA: Insufficient documentation

## 2018-05-25 DIAGNOSIS — R0602 Shortness of breath: Secondary | ICD-10-CM | POA: Diagnosis not present

## 2018-05-25 DIAGNOSIS — N200 Calculus of kidney: Secondary | ICD-10-CM | POA: Diagnosis not present

## 2018-05-25 DIAGNOSIS — Z7982 Long term (current) use of aspirin: Secondary | ICD-10-CM | POA: Insufficient documentation

## 2018-05-25 DIAGNOSIS — K5900 Constipation, unspecified: Secondary | ICD-10-CM | POA: Diagnosis not present

## 2018-05-25 LAB — COMPREHENSIVE METABOLIC PANEL
ALT: 37 U/L (ref 14–54)
ANION GAP: 10 (ref 5–15)
AST: 26 U/L (ref 15–41)
Albumin: 3.9 g/dL (ref 3.5–5.0)
Alkaline Phosphatase: 63 U/L (ref 38–126)
BILIRUBIN TOTAL: 0.5 mg/dL (ref 0.3–1.2)
BUN: 9 mg/dL (ref 6–20)
CO2: 38 mmol/L — ABNORMAL HIGH (ref 22–32)
Calcium: 9.9 mg/dL (ref 8.9–10.3)
Chloride: 91 mmol/L — ABNORMAL LOW (ref 101–111)
Creatinine, Ser: 0.7 mg/dL (ref 0.44–1.00)
Glucose, Bld: 126 mg/dL — ABNORMAL HIGH (ref 65–99)
POTASSIUM: 3.6 mmol/L (ref 3.5–5.1)
Sodium: 139 mmol/L (ref 135–145)
Total Protein: 7.4 g/dL (ref 6.5–8.1)

## 2018-05-25 LAB — URINALYSIS, ROUTINE W REFLEX MICROSCOPIC
Bilirubin Urine: NEGATIVE
GLUCOSE, UA: NEGATIVE mg/dL
Hgb urine dipstick: NEGATIVE
KETONES UR: NEGATIVE mg/dL
LEUKOCYTES UA: NEGATIVE
NITRITE: NEGATIVE
PROTEIN: NEGATIVE mg/dL
Specific Gravity, Urine: 1.011 (ref 1.005–1.030)
pH: 7 (ref 5.0–8.0)

## 2018-05-25 LAB — CBC WITH DIFFERENTIAL/PLATELET
BASOS ABS: 0 10*3/uL (ref 0.0–0.1)
BASOS PCT: 0 %
EOS PCT: 0 %
Eosinophils Absolute: 0 10*3/uL (ref 0.0–0.7)
HEMATOCRIT: 47.8 % — AB (ref 36.0–46.0)
Hemoglobin: 14.8 g/dL (ref 12.0–15.0)
LYMPHS PCT: 13 %
Lymphs Abs: 2.5 10*3/uL (ref 0.7–4.0)
MCH: 31.2 pg (ref 26.0–34.0)
MCHC: 31 g/dL (ref 30.0–36.0)
MCV: 100.8 fL — AB (ref 78.0–100.0)
Monocytes Absolute: 1.8 10*3/uL — ABNORMAL HIGH (ref 0.1–1.0)
Monocytes Relative: 10 %
NEUTROS ABS: 14.2 10*3/uL — AB (ref 1.7–7.7)
Neutrophils Relative %: 77 %
PLATELETS: 227 10*3/uL (ref 150–400)
RBC: 4.74 MIL/uL (ref 3.87–5.11)
RDW: 14 % (ref 11.5–15.5)
WBC: 18.5 10*3/uL — AB (ref 4.0–10.5)

## 2018-05-25 LAB — LIPASE, BLOOD: LIPASE: 39 U/L (ref 11–51)

## 2018-05-25 MED ORDER — OXYCODONE-ACETAMINOPHEN 5-325 MG PO TABS
1.0000 | ORAL_TABLET | Freq: Once | ORAL | Status: AC
  Administered 2018-05-25: 1 via ORAL
  Filled 2018-05-25: qty 1

## 2018-05-25 MED ORDER — METHOCARBAMOL 500 MG PO TABS: 1000.0000 mg | ORAL_TABLET | Freq: Four times a day (QID) | ORAL | 0 refills | Status: DC | PRN

## 2018-05-25 MED ORDER — METHYLPREDNISOLONE SODIUM SUCC 125 MG IJ SOLR
125.0000 mg | Freq: Once | INTRAMUSCULAR | Status: AC
  Administered 2018-05-25: 125 mg via INTRAVENOUS
  Filled 2018-05-25: qty 2

## 2018-05-25 MED ORDER — IPRATROPIUM BROMIDE 0.02 % IN SOLN
1.0000 mg | Freq: Once | RESPIRATORY_TRACT | Status: AC
  Administered 2018-05-25: 1 mg via RESPIRATORY_TRACT
  Filled 2018-05-25: qty 5

## 2018-05-25 MED ORDER — ALBUTEROL (5 MG/ML) CONTINUOUS INHALATION SOLN
10.0000 mg/h | INHALATION_SOLUTION | Freq: Once | RESPIRATORY_TRACT | Status: AC
Start: 2018-05-25 — End: 2018-05-25
  Administered 2018-05-25: 10 mg/h via RESPIRATORY_TRACT
  Filled 2018-05-25: qty 20

## 2018-05-25 NOTE — ED Triage Notes (Signed)
Pt with sob chronically due to COPD, here for back pain as well. Pt on home O2 at 3 L/M. C/o abd and back pain since yesterday, states her sob got worse.  Denies N/V/D.  LBM 2 days ago. Took prune juice.

## 2018-05-25 NOTE — Discharge Instructions (Addendum)
Take the prescription as directed. Increase your prednisone to 40 mg (4 tablets) by mouth daily for the next 5 days, then resume your usual dose.  Use your albuterol inhaler (2 to 4 puffs) or your albuterol nebulizer (1 unit dose) every 4 hours for the next 7 days, then as needed for cough, wheezing, or shortness of breath. Take over the counter laxative (such as miralax, milk of magnesia, or senokot) AND a dulcolax suppository (or enema) today and repeat both tomorrow.  Begin to take over the counter stool softener (colace), as directed on packaging, for the next month, and continue if you are taking any narcotic medication. Increase the fiber in your diet. Call your regular medical doctor today to schedule a follow up appointment within the next 3 days. Call your Pulmonologist and Orthopedist today to schedule a follow up appointment within the next week.  Return to the Emergency Department immediately if worsening.

## 2018-05-25 NOTE — ED Notes (Signed)
Ambulated patient with pulse ox. Pt ambulated with a steady gait and no acute distress was noted. Pt's O2 sat remained at 95% during ambulation on her regular amount of home O2.

## 2018-05-25 NOTE — ED Provider Notes (Signed)
[facility] EMERGENCY DEPARTMENT Provider Note   CSN: [id number] Arrival date & time: 05/25/18  1113     History   Chief Complaint Chief Complaint  Patient presents with  . Shortness of Breath    HPI Cindy [name] is a 62 y.o. female.   Shortness of Breath     Pt was seen at 1125. Per pt, c/o gradual onset and worsening of persistent multiple symptoms for the past few weeks. Pt's symptoms include: LBP radiating into her abd, constipation (last BM 2 days ago), acute flair of her chronic SOB/wheezing/cough ("my COPD"). States her Pulm MD "increased my steroids dose last week" without improvement of her COPD symptoms. LBP worsens with palpation of the area and body position changes. Endorses hx of chronic LBP. Pt also c/o chronic LLE swelling "that doesn't go down" for the past several months. States her PMD rx lasix without improvement. Denies bilat LE's swelling, no calf pain, no fevers, no CP/palpitations, no N/V/D, no focal motor weakness, no tingling/numbness in extremities, no saddle anesthesia, no incont/retention of bowel or bladder.    Past Medical History:  Diagnosis Date  . 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 Arthritis   . Chronic respiratory failure ([place]) On home 02 2-3L  . Compression fracture of lumbar vertebra ([place])   . COPD (chronic obstructive pulmonary disease) ([place])   . Hyperglycemia, drug-induced   . Hyperlipidemia   . Kidney stones 2014   Left side, multiple  . Osteoporosis 2013  . Shortness of breath   . Thyroid disease    pt denies  . Tobacco abuse     Patient Active Problem List   Diagnosis Date Noted  . 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 Leg swelling 04/15/2016  . Rash and nonspecific skin eruption 08/15/2014  . Ureteral stone with hydronephrosis 05/30/2013  . Allergic rhinitis 04/11/2013  . Ecchymosis 04/11/2013  . Kidney stones 04/11/2013  . Lumbar back pain 03/03/2013  . Compression fracture of L3 lumbar vertebra 03/03/2013  .  Unspecified constipation 03/03/2013  . Osteopenia 03/16/2012  . GERD (gastroesophageal reflux disease) 03/16/2012  . Thrush 03/02/2012  . Chronic hypoxemic respiratory failure ([place]) 02/27/2012  . Hyperlipidemia 02/25/2012  . Hyperglycemia, drug-induced 09/25/2011  . Tobacco abuse 09/24/2011  . COPD (chronic obstructive pulmonary disease) ([place]) 12/24/2009    Past Surgical History:  Procedure Laterality Date  . CERVICAL BIOPSY    . COLONOSCOPY N/A 03/31/2013   Procedure: COLONOSCOPY;  Surgeon: Rogene Houston, MD;  Location: [facility];  Service: Endoscopy;  Laterality: N/A;  730  . TUBAL LIGATION       OB History   None      Home Medications    Prior to Admission medications   Medication Sig Start Date End Date Taking? Authorizing Provider  albuterol (PROVENTIL HFA;VENTOLIN HFA) 108 (90 Base) MCG/ACT inhaler Inhale 2 puffs into the lungs every 6 (six) hours as needed for wheezing or shortness of breath. 05/18/18   [place], Rose Fillers, MD  albuterol (PROVENTIL) (2.5 MG/3ML) 0.083% nebulizer solution Take 3 mLs (2.5 mg total) by nebulization every 4 (four) hours as needed for wheezing or shortness of breath. 10/13/17 02/23/20  Alycia Rossetti, MD  alendronate (FOSAMAX) 70 MG tablet take 1 tablet by mouth every week on an empty stomach with 6 12/21/17   Alycia Rossetti, MD  aspirin EC 81 MG tablet Take 81 mg by mouth every morning.     [provider]  budesonide-formoterol (SYMBICORT) 160-4.5  MCG/ACT inhaler Inhale 2 puffs into the lungs 2 (two) times daily. 04/27/18   Alycia Rossetti, MD  Calcium Carb-Cholecalciferol 600-800 MG-UNIT TABS Take 2 tablets by mouth daily.     [provider]  diphenhydrAMINE (BENADRYL) 12.5 MG/5ML liquid Take 6.25 mg by mouth 4 (four) times daily as needed.    [provider]  furosemide (LASIX) 20 MG tablet Take 1 tablet (20 mg total) by mouth daily. 10/13/17   [place], Modena Nunnery, MD  methocarbamol (ROBAXIN) 500 MG tablet  Take 1 tablet (500 mg total) by mouth 4 (four) times daily. 04/27/18   [place], Modena Nunnery, MD  nystatin (MYCOSTATIN) 100000 UNIT/ML suspension Use as directed 5 mLs (500,000 Units total) in the mouth or throat 4 (four) times daily. 01/29/18   Alycia Rossetti, MD  oxyCODONE-acetaminophen (PERCOCET/ROXICET) 5-325 MG tablet take 1 tablet by mouth every 8 hours if needed for pain 05/21/18   Vic Blackbird F, MD  OXYGEN Inhale 2.5 L into the lungs continuous.     [provider]  pantoprazole (PROTONIX) 40 MG tablet Take 1 tablet (40 mg total) by mouth 2 (two) times daily. 04/29/18   Collene Gobble, MD  predniSONE (DELTASONE) 10 MG tablet TAKE 2 TABLETS BY MOUTH EVERY MORNING WITH FOOD 04/29/18   Collene Gobble, MD  simvastatin (ZOCOR) 40 MG tablet Take 1 tablet (40 mg total) by mouth at bedtime. 04/27/18   Alycia Rossetti, MD  Tiotropium Bromide Monohydrate (SPIRIVA RESPIMAT) 2.5 MCG/ACT AERS INHALE 2 PUFFS INTO THE LUNGS ONCE DAILY 04/27/18   Alycia Rossetti, MD    Family History Family History  Problem Relation Age of Onset  . Heart disease Mother   . Hypertension Sister   . Hypertension Brother     Social History Social History   Tobacco Use  . Smoking status: Current Every Day Smoker    Packs/day: 1.00    Years: 25.00    Pack years: 25.00    Types: Cigarettes  . Smokeless tobacco: Never Used  Substance Use Topics  . Alcohol use: No  . Drug use: No     Allergies   Keflex [cephalexin]; Doxycycline; Sulfa antibiotics; Avelox [moxifloxacin hcl in nacl]; and Toradol [ketorolac tromethamine]   Review of Systems Review of Systems  Respiratory: Positive for shortness of breath.    ROS: Statement: All systems negative except as marked or noted in the HPI; Constitutional: Negative for fever and chills. ; ; Eyes: Negative for eye pain, redness and discharge. ; ; ENMT: Negative for ear pain, hoarseness, nasal congestion, sinus pressure and sore throat. ; ; Cardiovascular:  Negative for chest pain, palpitations, diaphoresis, and bilateral peripheral edema. ; ; Respiratory: +cough, wheezing, SOB. Negative for stridor. ; ; Gastrointestinal: +constipation. Negative for nausea, vomiting, diarrhea, abdominal pain, blood in stool, hematemesis, jaundice and rectal bleeding. . ; ; Genitourinary: Negative for dysuria, flank pain and hematuria. ; ; Musculoskeletal: +LBP. Negative for neck pain. Negative for swelling and trauma.; ; Skin: Negative for pruritus, rash, abrasions, blisters, bruising and skin lesion.; ; Neuro: Negative for headache, lightheadedness and neck stiffness. Negative for weakness, altered level of consciousness, altered mental status, extremity weakness, paresthesias, involuntary movement, seizure and syncope.       Physical Exam Updated Vital Signs BP (!) 150/130 (BP Location: Left Wrist) Comment: pt will not be still.   Pulse (!) 118   Temp 98.3 F (36.8 C) (Oral)   Resp (!) 22   Ht 5\' 1"  (1.549 m)  Wt 90.7 kg (200 lb)   SpO2 100%   BMI 37.79 kg/m    BP (!) 181/93 (BP Location: Right Arm) Comment: Pt extremely tense during BP reading  Pulse 91   Temp 98.3 F (36.8 C) (Oral)   Resp 20   Ht 5\' 1"  (1.549 m)   Wt 90.7 kg (200 lb)   SpO2 95%   BMI 37.79 kg/m    Patient Vitals for the past 24 hrs:  BP Temp Temp src Pulse Resp SpO2 Height Weight  05/25/18 1119 (!) 150/130 98.3 F (36.8 C) Oral (!) 118 (!) 22 100 % - -  05/25/18 1118 - - - - - - 5\' 1"  (1.549 m) 90.7 kg (200 lb)     Physical Exam 1130: Physical examination:  Nursing notes reviewed; Vital signs and O2 SAT reviewed;  Constitutional: Well developed, Well nourished, Well hydrated, Uncomfortable appearing.;; Head:  Normocephalic, atraumatic; Eyes: EOMI, PERRL, No scleral icterus; ENMT: Mouth and pharynx normal, Mucous membranes moist; Neck: Supple, Full range of motion, No lymphadenopathy; Cardiovascular: Tachycardic rate and rhythm, No gallop; Respiratory: Breath sounds  diminished & equal bilaterally, insp/exp wheezes bilat. Faint audible wheezing.  Speaking short sentences, sitting upright, tachypneic.; Chest: Nontender, Movement normal; Abdomen: Soft, +protruberant. Nontender, Nondistended, Normal bowel sounds; Genitourinary: No CVA tenderness; Spine:  No midline CS, TS, LS tenderness. +TTP bilat lumbar paraspinal muscles.;; Extremities: Pulses normal, No tenderness, Trace pedal edema right foot. +2 edema left foot to knee with calf asymmetry.; Neuro: AA&Ox3, Major CN grossly intact.  Speech clear. No gross focal motor or sensory deficits in extremities. Climbs on and off stretcher by herself..; Skin: Color normal, Warm, Dry.    ED Treatments / Results  Labs (all labs ordered are listed, but only abnormal results are displayed)   EKG None  Radiology   Procedures Procedures (including critical care time)  Medications Ordered in ED Medications  methylPREDNISolone sodium succinate (SOLU-MEDROL) 125 mg/2 mL injection 125 mg (has no administration in time range)  albuterol (PROVENTIL,VENTOLIN) solution continuous neb (has no administration in time range)  ipratropium (ATROVENT) nebulizer solution 1 mg (has no administration in time range)     Initial Impression / Assessment and Plan / ED Course  I have reviewed the triage vital signs and the nursing notes.  Pertinent labs & imaging results that were available during my care of the patient were reviewed by me and considered in my medical decision making (see chart for details).  MDM Reviewed: previous chart, nursing note and vitals Reviewed previous: labs Interpretation: labs, x-ray, ultrasound and CT scan   CRITICAL CARE Performed by: Alfonzo Feller Total critical care time: 35 minutes Critical care time was exclusive of separately billable procedures and treating other patients. Critical care was necessary to treat or prevent imminent or life-threatening deterioration. Critical care was  time spent personally by me on the following activities: development of treatment plan with patient and/or surrogate as well as nursing, discussions with consultants, evaluation of patient's response to treatment, examination of patient, obtaining history from patient or surrogate, ordering and performing treatments and interventions, ordering and review of laboratory studies, ordering and review of radiographic studies, pulse oximetry and re-evaluation of patient's condition.   Results for orders placed or performed during the hospital encounter of 05/25/18  Comprehensive metabolic panel  Result Value Ref Range   Sodium 139 135 - 145 mmol/L   Potassium 3.6 3.5 - 5.1 mmol/L   Chloride 91 (L) 101 - 111 mmol/L   CO2 38 (  H) 22 - 32 mmol/L   Glucose, Bld 126 (H) 65 - 99 mg/dL   BUN 9 6 - 20 mg/dL   Creatinine, Ser 0.70 0.44 - 1.00 mg/dL   Calcium 9.9 8.9 - 10.3 mg/dL   Total Protein 7.4 6.5 - 8.1 g/dL   Albumin 3.9 3.5 - 5.0 g/dL   AST 26 15 - 41 U/L   ALT 37 14 - 54 U/L   Alkaline Phosphatase 63 38 - 126 U/L   Total Bilirubin 0.5 0.3 - 1.2 mg/dL   GFR calc non Af Amer >60 >60 mL/min   GFR calc Af Amer >60 >60 mL/min   Anion gap 10 5 - 15  Lipase, blood  Result Value Ref Range   Lipase 39 11 - 51 U/L  CBC with Differential  Result Value Ref Range   WBC 18.5 (H) 4.0 - 10.5 K/uL   RBC 4.74 3.87 - 5.11 MIL/uL   Hemoglobin 14.8 12.0 - 15.0 g/dL   HCT 47.8 (H) 36.0 - 46.0 %   MCV 100.8 (H) 78.0 - 100.0 fL   MCH 31.2 26.0 - 34.0 pg   MCHC 31.0 30.0 - 36.0 g/dL   RDW 14.0 11.5 - 15.5 %   Platelets 227 150 - 400 K/uL   Neutrophils Relative % 77 %   Neutro Abs 14.2 (H) 1.7 - 7.7 K/uL   Lymphocytes Relative 13 %   Lymphs Abs 2.5 0.7 - 4.0 K/uL   Monocytes Relative 10 %   Monocytes Absolute 1.8 (H) 0.1 - 1.0 K/uL   Eosinophils Relative 0 %   Eosinophils Absolute 0.0 0.0 - 0.7 K/uL   Basophils Relative 0 %   Basophils Absolute 0.0 0.0 - 0.1 K/uL  Urinalysis, Routine w reflex  microscopic  Result Value Ref Range   Color, Urine YELLOW YELLOW   APPearance HAZY (A) CLEAR   Specific Gravity, Urine 1.011 1.005 - 1.030   pH 7.0 5.0 - 8.0   Glucose, UA NEGATIVE NEGATIVE mg/dL   Hgb urine dipstick NEGATIVE NEGATIVE   Bilirubin Urine NEGATIVE NEGATIVE   Ketones, ur NEGATIVE NEGATIVE mg/dL   Protein, ur NEGATIVE NEGATIVE mg/dL   Nitrite NEGATIVE NEGATIVE   Leukocytes, UA NEGATIVE NEGATIVE   Dg Chest 2 View Result Date: 05/25/2018 CLINICAL DATA:  Shortness of breath. Back and abdominal pain. Cough. EXAM: CHEST - 2 VIEW COMPARISON:  One-view chest x-ray 03/14/2018 FINDINGS: The heart size is normal. Lungs are hyperexpanded. There is no edema or effusion. No focal airspace disease is present. Mild interstitial prominence is similar the prior studies. IMPRESSION: No active cardiopulmonary disease. Electronically Signed   By: San Morelle M.D.   On: 05/25/2018 13:55   US Venous Img Lower  Left (dvt Study) Result Date: 05/25/2018 CLINICAL DATA:  Left lower extremity edema varicose veins EXAM: LEFT LOWER EXTREMITY VENOUS DOPPLER ULTRASOUND TECHNIQUE: Gray-scale sonography with graded compression, as well as color Doppler and duplex ultrasound were performed to evaluate the lower extremity deep venous systems from the level of the common femoral vein and including the common femoral, femoral, profunda femoral, popliteal and calf veins including the posterior tibial, peroneal and gastrocnemius veins when visible. The superficial great saphenous vein was also interrogated. Spectral Doppler was utilized to evaluate flow at rest and with distal augmentation maneuvers in the common femoral, femoral and popliteal veins. COMPARISON:  None. FINDINGS: Contralateral Common Femoral Vein: Respiratory phasicity is normal and symmetric with the symptomatic side. No evidence of thrombus. Normal compressibility. Common Femoral Vein:  No evidence of thrombus. Normal compressibility, respiratory  phasicity and response to augmentation. Saphenofemoral Junction: No evidence of thrombus. Normal compressibility and flow on color Doppler imaging. Profunda Femoral Vein: No evidence of thrombus. Normal compressibility and flow on color Doppler imaging. Femoral Vein: No evidence of thrombus. Normal compressibility, respiratory phasicity and response to augmentation. Popliteal Vein: No evidence of thrombus. Normal compressibility, respiratory phasicity and response to augmentation. Calf Veins: No evidence of thrombus. Normal compressibility and flow on color Doppler imaging. Superficial Great Saphenous Vein: No evidence of thrombus. Normal compressibility. Venous Reflux:  None. Other Findings: Left popliteal fossa complex loculated left Baker cyst measures up to 5.9 x 2.4 x 4.6 cm IMPRESSION: Negative for significant left lower extremity DVT. 5.9 cm complex loculated/septated left Baker cyst. Electronically Signed   By: Jerilynn Mages.  Shick M.D.   On: 05/25/2018 13:14   Ct Renal Stone Study Result Date: 05/25/2018 CLINICAL DATA:  Low mid back pain, lower abdominal distension. History of LEFT adrenal adenoma, LEFT kidney stones. EXAM: CT ABDOMEN AND PELVIS WITHOUT CONTRAST TECHNIQUE: Multidetector CT imaging of the abdomen and pelvis was performed following the standard protocol without IV contrast. COMPARISON:  Abdominal radiograph November 30, 2017 and CT abdomen and pelvis April 15, 2014. FINDINGS: Moderate motion degraded examination through upper abdomen. LOWER CHEST: Lung bases are clear. The visualized heart size is normal. No pericardial effusion. HEPATOBILIARY: Included liver is unremarkable. 5 mm gallstone versus polyp or motion artifact. PANCREAS: Normal. SPLEEN: Normal. ADRENALS/URINARY TRACT: Kidneys are orthotopic, demonstrating normal size and morphology. Bilateral nephrolithiasis measuring to 7 mm. No hydronephrosis; limited assessment for renal masses by nonenhanced CT and motion. The unopacified ureters are  normal in course and caliber. Urinary bladder is partially distended and unremarkable. 2.1 cm LEFT adrenal adenoma (-21 Hounsfield units). 1 cm RIGHT adrenal adenoma. STOMACH/BOWEL: The stomach, small and large bowel are normal in course and caliber without inflammatory changes, sensitivity decreased by lack of enteric contrast. Mild sigmoid colonic diverticulosis. Moderate amount of retained large bowel stool. Moderate debris-filled duodenal diverticulum. Normal appendix. VASCULAR/LYMPHATIC: Aortoiliac vessels are normal in course and caliber. Mild calcific atherosclerosis. No lymphadenopathy by CT size criteria. REPRODUCTIVE: Normal. OTHER: No intraperitoneal free fluid or free air. MUSCULOSKELETAL: Non-acute. Old moderate L3 compression fracture. Motion results in spurious anterolisthesis L1-2. IMPRESSION: 1. Moderate motion through upper abdomen. 2. Bilateral nonobstructing nephrolithiasis measuring to approximately 7 mm. 3. Moderate volume retained large bowel stool without bowel obstruction. Aortic Atherosclerosis (ICD10-I70.0). Electronically Signed   By: Elon Alas M.D.   On: 05/25/2018 14:16    1530:  WBC count elevated; pt on chronic prednisone, no fever and no infection on workup. Workup otherwise reassuring. Pt states she "feels better" after neb and steroid.  NAD, lungs CTA diminished, no wheezing, resps easy, speaking full sentences, Sats 95- 100% on her usual O2 3L N/C.  Pt ambulated around the ED with Sats remaining 95 % on her usual O2 3L N/C, resps easy, NAD.  Pt requesting a "shot" of narcotic pain medication for her chronic back pain. Explained narcotics will contribute to her constipation; will only give one dose here. Pt has narcotic cough syrup rx filled a few days ago. Will tx for COPD exacerbation. Pt encouraged to f/u with her PMD and Pain Management doctor for good continuity of care and control of her chronic back pain.  Pt verb understanding. Dx and testing d/w pt and family.   Questions answered.  Verb understanding, agreeable to d/c home with outpt f/u.  Final Clinical Impressions(s) / ED Diagnoses   Final diagnoses:  None    ED Discharge Orders    None       Francine Graven, DO 05/29/18 [id number]

## 2018-05-26 ENCOUNTER — Telehealth: Payer: Self-pay | Admitting: Emergency Medicine

## 2018-05-26 ENCOUNTER — Telehealth: Payer: Self-pay | Admitting: Family Medicine

## 2018-05-26 NOTE — Telephone Encounter (Signed)
I'm not sure I have anything to offer that wil help her muscle pain. If it is progressive then she will need to go be seen

## 2018-05-26 NOTE — Telephone Encounter (Signed)
Spoke with patient. She was discharged from the hospital yesterday from having a COPD exacerbation and back spasms. She was sent home with just muscle relaxers. Now that she has been home, the spasms are getting worse and she can not take a deep breath. She feels that she has to cough but is unable to do so due to the pain.   She wants to know if RB has recommendations for her or should she go back to the hospital?

## 2018-05-26 NOTE — Telephone Encounter (Signed)
Called and spoke to patient and relayed RB's message. Patient stated she has an upcoming appointment with her PCP and will address the pain/muscle spasms. Nothing further is needed at this time.

## 2018-05-26 NOTE — Telephone Encounter (Signed)
Patient states she has back pain, rib pain, and can hardly make it to the restroom.  She then said she did not know what we could do for her in the office and could we get her in the hospital.

## 2018-05-26 NOTE — Telephone Encounter (Signed)
Pt is going to come in Friday for a follow-up appointment with [place] after being in the ER.  Please let her know that imaging done in the ER showed a large stool burden in her colon which is significant for moderate to severe constipation.  This is probably contributing towards her back pain.  Please have her take at least 2 capfuls of MiraLAX 2 times a day, increase fluid intake, and help with this will have her bowels moving more easily clean out her colon. If MiraLAX is not effective she can try a bottle of mag citrate, take half a bottle and be available to be near toilet for the rest the day.  No bowel movement she should finish the other half the bottle and she should have a bowel movement.    If this doesn't work we may need to check her for a fecal impaction, or have her to an enema at home too.   thanks

## 2018-05-27 ENCOUNTER — Other Ambulatory Visit: Payer: Self-pay

## 2018-05-27 ENCOUNTER — Emergency Department (HOSPITAL_COMMUNITY)
Admission: EM | Admit: 2018-05-27 | Discharge: 2018-05-27 | Disposition: A | Discharge status: Home / Self Care | Payer: Medicare Other | Attending: Emergency Medicine | Admitting: Emergency Medicine

## 2018-05-27 ENCOUNTER — Encounter (HOSPITAL_COMMUNITY): Payer: Self-pay | Admitting: *Deleted

## 2018-05-27 DIAGNOSIS — S39012A Strain of muscle, fascia and tendon of lower back, initial encounter: Secondary | ICD-10-CM | POA: Diagnosis not present

## 2018-05-27 DIAGNOSIS — X58XXXA Exposure to other specified factors, initial encounter: Secondary | ICD-10-CM | POA: Insufficient documentation

## 2018-05-27 DIAGNOSIS — Y939 Activity, unspecified: Secondary | ICD-10-CM | POA: Diagnosis not present

## 2018-05-27 DIAGNOSIS — S3992XA Unspecified injury of lower back, initial encounter: Secondary | ICD-10-CM | POA: Diagnosis present

## 2018-05-27 DIAGNOSIS — Y929 Unspecified place or not applicable: Secondary | ICD-10-CM | POA: Diagnosis not present

## 2018-05-27 DIAGNOSIS — S39012D Strain of muscle, fascia and tendon of lower back, subsequent encounter: Secondary | ICD-10-CM

## 2018-05-27 DIAGNOSIS — M545 Low back pain: Secondary | ICD-10-CM | POA: Diagnosis not present

## 2018-05-27 DIAGNOSIS — Y999 Unspecified external cause status: Secondary | ICD-10-CM | POA: Insufficient documentation

## 2018-05-27 DIAGNOSIS — K5900 Constipation, unspecified: Secondary | ICD-10-CM | POA: Diagnosis not present

## 2018-05-27 DIAGNOSIS — F17210 Nicotine dependence, cigarettes, uncomplicated: Secondary | ICD-10-CM | POA: Diagnosis not present

## 2018-05-27 DIAGNOSIS — J449 Chronic obstructive pulmonary disease, unspecified: Secondary | ICD-10-CM | POA: Diagnosis not present

## 2018-05-27 DIAGNOSIS — Z7982 Long term (current) use of aspirin: Secondary | ICD-10-CM | POA: Insufficient documentation

## 2018-05-27 LAB — URINE CULTURE: CULTURE: NO GROWTH

## 2018-05-27 MED ORDER — HYDROMORPHONE HCL 1 MG/ML IJ SOLN
1.0000 mg | Freq: Once | INTRAMUSCULAR | Status: AC
  Administered 2018-05-27: 1 mg via INTRAMUSCULAR
  Filled 2018-05-27: qty 1

## 2018-05-27 MED ORDER — HYDROMORPHONE HCL 4 MG PO TABS: 4.0000 mg | ORAL_TABLET | Freq: Four times a day (QID) | ORAL | 0 refills | Status: DC | PRN

## 2018-05-27 MED ORDER — PEG 3350-KCL-NABCB-NACL-NASULF 236 G PO SOLR: ORAL | 0 refills | Status: DC

## 2018-05-27 MED ORDER — ALBUTEROL SULFATE (2.5 MG/3ML) 0.083% IN NEBU
2.5000 mg | INHALATION_SOLUTION | Freq: Once | RESPIRATORY_TRACT | Status: AC
  Administered 2018-05-27: 2.5 mg via RESPIRATORY_TRACT
  Filled 2018-05-27: qty 3

## 2018-05-27 NOTE — Telephone Encounter (Signed)
Left message return call

## 2018-05-27 NOTE — ED Triage Notes (Signed)
Pt c/o lower back pain for the last couple of days; pt denies any obvious injury 

## 2018-05-27 NOTE — ED Provider Notes (Signed)
[facility] Provider Note   CSN: [id number] Arrival date & time: 05/27/18  1020     History   Chief Complaint Chief Complaint  Patient presents with  . Back Pain    HPI Cindy [name] is a 62 y.o. female.  Patient complains of low back pain.  Patient has been taking approximately 1.25 mg of Percocet for the pain without relief.  She was seen in the emergency department 2 days ago with normal labs and normal CT abdomen.  The history is provided by the patient. No language interpreter was used.  Back Pain   This is a recurrent problem. The current episode started more than 2 days ago. The problem occurs constantly. The problem has not changed since onset.The pain is associated with no known injury. The pain is present in the lumbar spine. The quality of the pain is described as aching. The pain does not radiate. Pertinent negatives include no chest pain, no headaches and no abdominal pain.    Past Medical History:  Diagnosis Date  . 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 Arthritis   . Chronic respiratory failure ([place]) On home 02 2-3L  . Compression fracture of lumbar vertebra ([place])   . COPD (chronic obstructive pulmonary disease) ([place])   . Hyperglycemia, drug-induced   . Hyperlipidemia   . Kidney stones 2014   Left side, multiple  . Osteoporosis 2013  . Shortness of breath   . Thyroid disease    pt denies  . Tobacco abuse     Patient Active Problem List   Diagnosis Date Noted  . 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 Leg swelling 04/15/2016  . Rash and nonspecific skin eruption 08/15/2014  . Ureteral stone with hydronephrosis 05/30/2013  . Allergic rhinitis 04/11/2013  . Ecchymosis 04/11/2013  . Kidney stones 04/11/2013  . Lumbar back pain 03/03/2013  . Compression fracture of L3 lumbar vertebra 03/03/2013  . Unspecified constipation 03/03/2013  . Osteopenia 03/16/2012  . GERD (gastroesophageal reflux disease) 03/16/2012  . Thrush  03/02/2012  . Chronic hypoxemic respiratory failure ([place]) 02/27/2012  . Hyperlipidemia 02/25/2012  . Hyperglycemia, drug-induced 09/25/2011  . Tobacco abuse 09/24/2011  . COPD (chronic obstructive pulmonary disease) ([place]) 12/24/2009    Past Surgical History:  Procedure Laterality Date  . CERVICAL BIOPSY    . COLONOSCOPY N/A 03/31/2013   Procedure: COLONOSCOPY;  Surgeon: Rogene Houston, MD;  Location: [facility];  Service: Endoscopy;  Laterality: N/A;  730  . TUBAL LIGATION       OB History   None      Home Medications    Prior to Admission medications   Medication Sig Start Date End Date Taking? Authorizing Provider  albuterol (PROVENTIL HFA;VENTOLIN HFA) 108 (90 Base) MCG/ACT inhaler Inhale 2 puffs into the lungs every 6 (six) hours as needed for wheezing or shortness of breath. 05/18/18   Byrum, Rose Fillers, MD  albuterol (PROVENTIL) (2.5 MG/3ML) 0.083% nebulizer solution Take 3 mLs (2.5 mg total) by nebulization every 4 (four) hours as needed for wheezing or shortness of breath. 10/13/17 02/23/20  Alycia Rossetti, MD  alendronate (FOSAMAX) 70 MG tablet take 1 tablet by mouth every week on an empty stomach with 6 12/21/17   Alycia Rossetti, MD  aspirin EC 81 MG tablet Take 81 mg by mouth every morning.     [provider]  budesonide-formoterol (SYMBICORT) 160-4.5 MCG/ACT inhaler Inhale 2 puffs into the lungs 2 (two) times daily.  04/27/18   Alycia Rossetti, MD  Calcium Carb-Cholecalciferol 600-800 MG-UNIT TABS Take 2 tablets by mouth daily.     [provider]  diphenhydrAMINE (BENADRYL) 12.5 MG/5ML liquid Take 6.25 mg by mouth 4 (four) times daily as needed.    [provider]  furosemide (LASIX) 20 MG tablet Take 1 tablet (20 mg total) by mouth daily. Patient taking differently: Take 20 mg by mouth daily as needed.  10/13/17   Vincent, Modena Nunnery, MD  HYDROmorphone (DILAUDID) 4 MG tablet Take 1 tablet (4 mg total) by mouth every 6 (six) hours as  needed for severe pain. 05/27/18   Milton Ferguson, MD  methocarbamol (ROBAXIN) 500 MG tablet Take 2 tablets (1,000 mg total) by mouth 4 (four) times daily as needed for muscle spasms (muscle spasm/pain). 05/25/18   Francine Graven, DO  nystatin (MYCOSTATIN) 100000 UNIT/ML suspension Use as directed 5 mLs (500,000 Units total) in the mouth or throat 4 (four) times daily. 01/29/18   Alycia Rossetti, MD  oxyCODONE-acetaminophen (PERCOCET/ROXICET) 5-325 MG tablet take 1 tablet by mouth every 8 hours if needed for pain 05/21/18   Vic Blackbird F, MD  OXYGEN Inhale 2.5 L into the lungs continuous.     [provider]  pantoprazole (PROTONIX) 40 MG tablet Take 1 tablet (40 mg total) by mouth 2 (two) times daily. 04/29/18   Collene Gobble, MD  predniSONE (DELTASONE) 10 MG tablet TAKE 2 TABLETS BY MOUTH EVERY MORNING WITH FOOD 04/29/18   Collene Gobble, MD  simvastatin (ZOCOR) 40 MG tablet Take 1 tablet (40 mg total) by mouth at bedtime. 04/27/18   Alycia Rossetti, MD  Tiotropium Bromide Monohydrate (SPIRIVA RESPIMAT) 2.5 MCG/ACT AERS INHALE 2 PUFFS INTO THE LUNGS ONCE DAILY 04/27/18   Alycia Rossetti, MD    Family History Family History  Problem Relation Age of Onset  . Heart disease Mother   . Hypertension Sister   . Hypertension Brother     Social History Social History   Tobacco Use  . Smoking status: Current Every Day Smoker    Packs/day: 1.00    Years: 25.00    Pack years: 25.00    Types: Cigarettes  . Smokeless tobacco: Never Used  Substance Use Topics  . Alcohol use: No  . Drug use: No     Allergies   Keflex [cephalexin]; Doxycycline; Sulfa antibiotics; Avelox [moxifloxacin hcl in nacl]; and Toradol [ketorolac tromethamine]   Review of Systems Review of Systems  Constitutional: Negative for appetite change and fatigue.  HENT: Negative for congestion, ear discharge and sinus pressure.   Eyes: Negative for discharge.  Respiratory: Negative for cough.     Cardiovascular: Negative for chest pain.  Gastrointestinal: Positive for constipation. Negative for abdominal pain and diarrhea.  Genitourinary: Negative for frequency and hematuria.  Musculoskeletal: Positive for back pain.  Skin: Negative for rash.  Neurological: Negative for seizures and headaches.  Psychiatric/Behavioral: Negative for hallucinations.     Physical Exam Updated Vital Signs BP (!) 155/94   Pulse 100   Temp 98.1 F (36.7 C) (Oral)   Resp 20   Ht 5\' 1"  (1.549 m)   Wt 90.7 kg (200 lb)   SpO2 94%   BMI 37.79 kg/m   Physical Exam  Constitutional: She is oriented to person, place, and time. She appears well-developed.  HENT:  Head: Normocephalic.  Eyes: Conjunctivae and EOM are normal. No scleral icterus.  Neck: Neck supple. No thyromegaly present.  Cardiovascular: Normal rate  and regular rhythm. Exam reveals no gallop and no friction rub.  No murmur heard. Pulmonary/Chest: No stridor. She has no wheezes. She has no rales. She exhibits no tenderness.  Abdominal: She exhibits no distension. There is no tenderness. There is no rebound.  Musculoskeletal: Normal range of motion. She exhibits no edema.  Tender lumbar spine  Lymphadenopathy:    She has no cervical adenopathy.  Neurological: She is oriented to person, place, and time. She exhibits normal muscle tone. Coordination normal.  Skin: No rash noted. No erythema.  Psychiatric: She has a normal mood and affect. Her behavior is normal.     ED Treatments / Results  Labs (all labs ordered are listed, but only abnormal results are displayed) Labs Reviewed - No data to display  EKG None  Radiology Ct Renal Stone Study  Result Date: 05/25/2018 CLINICAL DATA:  Low mid back pain, lower abdominal distension. History of LEFT adrenal adenoma, LEFT kidney stones. EXAM: CT ABDOMEN AND PELVIS WITHOUT CONTRAST TECHNIQUE: Multidetector CT imaging of the abdomen and pelvis was performed following the standard  protocol without IV contrast. COMPARISON:  Abdominal radiograph November 30, 2017 and CT abdomen and pelvis April 15, 2014. FINDINGS: Moderate motion degraded examination through upper abdomen. LOWER CHEST: Lung bases are clear. The visualized heart size is normal. No pericardial effusion. HEPATOBILIARY: Included liver is unremarkable. 5 mm gallstone versus polyp or motion artifact. PANCREAS: Normal. SPLEEN: Normal. ADRENALS/URINARY TRACT: Kidneys are orthotopic, demonstrating normal size and morphology. Bilateral nephrolithiasis measuring to 7 mm. No hydronephrosis; limited assessment for renal masses by nonenhanced CT and motion. The unopacified ureters are normal in course and caliber. Urinary bladder is partially distended and unremarkable. 2.1 cm LEFT adrenal adenoma (-21 Hounsfield units). 1 cm RIGHT adrenal adenoma. STOMACH/BOWEL: The stomach, small and large bowel are normal in course and caliber without inflammatory changes, sensitivity decreased by lack of enteric contrast. Mild sigmoid colonic diverticulosis. Moderate amount of retained large bowel stool. Moderate debris-filled duodenal diverticulum. Normal appendix. VASCULAR/LYMPHATIC: Aortoiliac vessels are normal in course and caliber. Mild calcific atherosclerosis. No lymphadenopathy by CT size criteria. REPRODUCTIVE: Normal. OTHER: No intraperitoneal free fluid or free air. MUSCULOSKELETAL: Non-acute. Old moderate L3 compression fracture. Motion results in spurious anterolisthesis L1-2. IMPRESSION: 1. Moderate motion through upper abdomen. 2. Bilateral nonobstructing nephrolithiasis measuring to approximately 7 mm. 3. Moderate volume retained large bowel stool without bowel obstruction. Aortic Atherosclerosis (ICD10-I70.0). Electronically Signed   By: Elon Alas M.D.   On: 05/25/2018 14:16    Procedures Procedures (including critical care time)  Medications Ordered in ED Medications  albuterol (PROVENTIL) (2.5 MG/3ML) 0.083% nebulizer  solution 2.5 mg (has no administration in time range)  HYDROmorphone (DILAUDID) injection 1 mg (1 mg Intramuscular Given 05/27/18 1126)     Initial Impression / Assessment and Plan / ED Course  I have reviewed the triage vital signs and the nursing notes.  Pertinent labs & imaging results that were available during my care of the patient were reviewed by me and considered in my medical decision making (see chart for details).     Patient with exacerbation of lumbar spine pain.  CT scan recently was unremarkable.  Patient's pain improved with 1 mg of Dilaudid IM.  She is given prescription of Dilaudid and some GoLYTELY for constipation will follow-up with PCP  Final Clinical Impressions(s) / ED Diagnoses   Final diagnoses:  Strain of lumbar region, subsequent encounter    ED Discharge Orders        Ordered  HYDROmorphone (DILAUDID) 4 MG tablet  Every 6 hours PRN     05/27/18 1401       Milton Ferguson, MD 05/27/18 1412

## 2018-05-27 NOTE — Discharge Instructions (Addendum)
Follow-up with your primary care doctor next week for recheck. 

## 2018-05-28 ENCOUNTER — Telehealth: Payer: Self-pay | Admitting: Family Medicine

## 2018-05-28 ENCOUNTER — Other Ambulatory Visit: Payer: Self-pay

## 2018-05-28 ENCOUNTER — Ambulatory Visit (INDEPENDENT_AMBULATORY_CARE_PROVIDER_SITE_OTHER): Payer: Medicare Other | Admitting: Family Medicine

## 2018-05-28 VITALS — BP 152/90 | HR 101 | Temp 98.3°F

## 2018-05-28 DIAGNOSIS — J9611 Chronic respiratory failure with hypoxia: Secondary | ICD-10-CM | POA: Diagnosis not present

## 2018-05-28 DIAGNOSIS — G8929 Other chronic pain: Secondary | ICD-10-CM

## 2018-05-28 DIAGNOSIS — M545 Low back pain, unspecified: Secondary | ICD-10-CM

## 2018-05-28 DIAGNOSIS — K5909 Other constipation: Secondary | ICD-10-CM | POA: Diagnosis not present

## 2018-05-28 DIAGNOSIS — R109 Unspecified abdominal pain: Secondary | ICD-10-CM

## 2018-05-28 DIAGNOSIS — J441 Chronic obstructive pulmonary disease with (acute) exacerbation: Secondary | ICD-10-CM | POA: Diagnosis not present

## 2018-05-28 MED ORDER — CYCLOBENZAPRINE HCL 10 MG PO TABS: 10.0000 mg | ORAL_TABLET | Freq: Three times a day (TID) | ORAL | 0 refills | Status: DC | PRN

## 2018-05-28 MED ORDER — MAGNESIUM CITRATE PO SOLN: 148.0000 mL | Freq: Once | ORAL | 0 refills | Status: DC

## 2018-05-28 MED ORDER — ALBUTEROL SULFATE (2.5 MG/3ML) 0.083% IN NEBU: 2.5000 mg | INHALATION_SOLUTION | Freq: Four times a day (QID) | RESPIRATORY_TRACT | 1 refills | Status: DC | PRN

## 2018-05-28 MED ORDER — ALBUTEROL SULFATE (2.5 MG/3ML) 0.083% IN NEBU
2.5000 mg | INHALATION_SOLUTION | Freq: Once | RESPIRATORY_TRACT | Status: AC
  Administered 2018-05-28: 2.5 mg via RESPIRATORY_TRACT

## 2018-05-28 MED ORDER — IPRATROPIUM-ALBUTEROL 0.5-2.5 (3) MG/3ML IN SOLN
3.0000 mL | Freq: Once | RESPIRATORY_TRACT | Status: AC
  Administered 2018-05-28: 3 mL via RESPIRATORY_TRACT

## 2018-05-28 MED ORDER — POLYETHYLENE GLYCOL 3350 17 GM/SCOOP PO POWD: 17.0000 g | Freq: Two times a day (BID) | ORAL | 1 refills | Status: DC | PRN

## 2018-05-28 MED ORDER — TIOTROPIUM BROMIDE MONOHYDRATE 2.5 MCG/ACT IN AERS: INHALATION_SPRAY | RESPIRATORY_TRACT | 11 refills | Status: DC

## 2018-05-28 MED ORDER — METHYLPREDNISOLONE ACETATE 40 MG/ML IJ SUSP
40.0000 mg | Freq: Once | INTRAMUSCULAR | Status: AC
  Administered 2018-05-28: 40 mg via INTRAMUSCULAR

## 2018-05-28 NOTE — Telephone Encounter (Signed)
Spoke with patient and informed her per Dr. Buelah Manis- She has lasix, she should take 1 a day, this should decrease her swelling and help her BP come down, needs F/U Next week- Tuesday/Wed. Patient verbalized understanding and is scheduled for Wednesday at 11:30

## 2018-05-28 NOTE — Patient Instructions (Signed)
Do miralax 2 capfuls twice a day  Do an enema  If you cannot have a BM go to the ER for assitance with enema to clear out bowels.  Call us so we can contact the ER on your behalf.  Continue to use prednisone 40 mg for the next 2 days and decrease to 20 mg until he see Dr. Lamonte Sakai  Your inhalers have been refilled for you  He has switched your muscle spasm medication from Robaxin to Flexeril do not take together and avoid taking with narcotic pain medicine.  Please DC Dilaudid pills as they are only making your back pain and abdominal pain and consultation worse

## 2018-05-28 NOTE — Telephone Encounter (Signed)
Patient called asking if we are going to address her blood pressure being elevated today and at the ER this week. Patient would like to know if we could call in some blood pressure medications. Please advise?

## 2018-05-28 NOTE — Progress Notes (Addendum)
Patient ID: Cindy [name], female    DOB: [birth year], 62 y.o.   MRN: [id number]  PCP: Alycia Rossetti, MD  Chief Complaint  Patient presents with  . COPD  . Hospitalization Follow-up    Patient states she has pain in back in rib area when moving it goes to her abdomen and takes her breath away.    Subjective:   Cindy [name] is a 62 y.o. female, presents to clinic with CC of SOB secondary to acute on chronic respiratory failure with COPD, is on 3 L of oxygen, was seen in the ER 3 days ago for same complaint, she does see pulmonology as well who had recently increase her steroids without improvement of her breathing  ER visit from 05/25/2018 reviewed pertinent for the following: She had a negative DVT study done 3 days ago, chest x-ray was pertinent for mild interstitial prominence and lungs hyperexpanded, no change from her prior studies, no effusion, no edema.  CT renal stone study showed moderate amount of retained large bowel stool without bowel obstruction.  Of note patient has multiple phone calls documented to several providers since her ER visit seeking for pain medication because of severe pain to her abdomen and back.  Is prescribed Percocet.  She was given a shot of pain medicine in the ER but they were hesitant with concern for worsening constipation.    She states that she has tried suppositories and stool softeners without any bowel movement for at least 5 days.    I did review her ER visit 2 days ago and we attempted to call the patient to instruct her to use a MiraLAX bowel cleanse and if that did not work, to use max citrate, we could not get a hold of her and we do not know if she got her messages.  She was seen in the ER again yesterday back pain and spasms.  The ER she received breathing treatments again, Dilaudid IM and then dilaudid Rx and Golytely, instructed to f/up here.    Her complaints here today in clinic are unchanged from her recent visits.  She continues  to complain about back spasms that are not improved with Robaxin.  She complains of constipation and abdominal pain.  She also complains of shortness of breath that she states is improved only when given the DuoNeb's in clinic or in hospital but otherwise has not significantly improved since her initial visit to the ER 4 days ago.  She is still using the same amount of oxygen, denies any worsening exertional dyspnea from her baseline.  Patient Active Problem List   Diagnosis Date Noted  . 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 Leg swelling 04/15/2016  . Rash and nonspecific skin eruption 08/15/2014  . Ureteral stone with hydronephrosis 05/30/2013  . Allergic rhinitis 04/11/2013  . Ecchymosis 04/11/2013  . Kidney stones 04/11/2013  . Lumbar back pain 03/03/2013  . Compression fracture of L3 lumbar vertebra 03/03/2013  . Unspecified constipation 03/03/2013  . Osteopenia 03/16/2012  . GERD (gastroesophageal reflux disease) 03/16/2012  . Thrush 03/02/2012  . Chronic hypoxemic respiratory failure ([place]) 02/27/2012  . Hyperlipidemia 02/25/2012  . Hyperglycemia, drug-induced 09/25/2011  . Tobacco abuse 09/24/2011  . COPD (chronic obstructive pulmonary disease) ([place]) 12/24/2009     Prior to Admission medications   Medication Sig Start Date End Date Taking? Authorizing Provider  albuterol (PROVENTIL HFA;VENTOLIN HFA) 108 (90 Base) MCG/ACT inhaler Inhale 2 puffs into the lungs every 6 (six) hours  as needed for wheezing or shortness of breath. 05/18/18   Collene Gobble, MD  albuterol (PROVENTIL) (2.5 MG/3ML) 0.083% nebulizer solution Take 3 mLs (2.5 mg total) by nebulization every 4 (four) hours as needed for wheezing or shortness of breath. 10/13/17 02/23/20  Alycia Rossetti, MD  alendronate (FOSAMAX) 70 MG tablet take 1 tablet by mouth every week on an empty stomach with 6 12/21/17   Alycia Rossetti, MD  aspirin EC 81 MG tablet Take 81 mg by mouth every morning.     [provider]  budesonide-formoterol (SYMBICORT) 160-4.5 MCG/ACT inhaler Inhale 2 puffs into the lungs 2 (two) times daily. 04/27/18   Alycia Rossetti, MD  Calcium Carb-Cholecalciferol 600-800 MG-UNIT TABS Take 2 tablets by mouth daily.     [provider]  diphenhydrAMINE (BENADRYL) 12.5 MG/5ML liquid Take 6.25 mg by mouth 4 (four) times daily as needed.    [provider]  furosemide (LASIX) 20 MG tablet Take 1 tablet (20 mg total) by mouth daily. Patient taking differently: Take 20 mg by mouth daily as needed.  10/13/17   [facility] [facility], Modena Nunnery, MD  HYDROmorphone (DILAUDID) 4 MG tablet Take 1 tablet (4 mg total) by mouth every 6 (six) hours as needed for severe pain. 05/27/18   Milton Ferguson, MD  methocarbamol (ROBAXIN) 500 MG tablet Take 2 tablets (1,000 mg total) by mouth 4 (four) times daily as needed for muscle spasms (muscle spasm/pain). 05/25/18   Francine Graven, DO  nystatin (MYCOSTATIN) 100000 UNIT/ML suspension Use as directed 5 mLs (500,000 Units total) in the mouth or throat 4 (four) times daily. 01/29/18   Alycia Rossetti, MD  oxyCODONE-acetaminophen (PERCOCET/ROXICET) 5-325 MG tablet take 1 tablet by mouth every 8 hours if needed for pain 05/21/18   Vic Blackbird F, MD  OXYGEN Inhale 2.5 L into the lungs continuous.     [provider]  pantoprazole (PROTONIX) 40 MG tablet Take 1 tablet (40 mg total) by mouth 2 (two) times daily. 04/29/18   Collene Gobble, MD  polyethylene glycol (GOLYTELY) 236 g solution Drink one 8 ounce glass every hour until you have a bowel movement 05/27/18   Milton Ferguson, MD  predniSONE (DELTASONE) 10 MG tablet TAKE 2 TABLETS BY MOUTH EVERY MORNING WITH FOOD 04/29/18   Collene Gobble, MD  simvastatin (ZOCOR) 40 MG tablet Take 1 tablet (40 mg total) by mouth at bedtime. 04/27/18   Alycia Rossetti, MD  Tiotropium Bromide Monohydrate (SPIRIVA RESPIMAT) 2.5 MCG/ACT AERS INHALE 2 PUFFS INTO THE LUNGS ONCE DAILY 04/27/18   Alycia Rossetti,  MD     Allergies  Allergen Reactions  . Keflex [Cephalexin] Anaphylaxis and Rash  . Doxycycline Other (See Comments)    Gave patient oral thrush  . Sulfa Antibiotics   . Avelox [Moxifloxacin Hcl In Nacl] Itching and Rash  . Toradol [Ketorolac Tromethamine] Swelling, Rash and Other (See Comments)    Bruising, swelling, rash at injection site     Family History  Problem Relation Age of Onset  . Heart disease Mother   . Hypertension Sister   . Hypertension Brother      Social History   Socioeconomic History  . Marital status: Divorced    Spouse name: Not on file  . Number of children: 3  . Years of education: Not on file  . Highest education level: Not on file  Occupational History  . Occupation: disabled    Employer: DISABLED  Social Needs  .  Financial resource strain: Not on file  . Food insecurity:    Worry: Not on file    Inability: Not on file  . Transportation needs:    Medical: Not on file    Non-medical: Not on file  Tobacco Use  . Smoking status: Current Every Day Smoker    Packs/day: 1.00    Years: 25.00    Pack years: 25.00    Types: Cigarettes  . Smokeless tobacco: Never Used  Substance and Sexual Activity  . Alcohol use: No  . Drug use: No  . Sexual activity: Not Currently    Birth control/protection: Surgical  Lifestyle  . Physical activity:    Days per week: Not on file    Minutes per session: Not on file  . Stress: Not on file  Relationships  . Social connections:    Talks on phone: Not on file    Gets together: Not on file    Attends religious service: Not on file    Active member of club or organization: Not on file    Attends meetings of clubs or organizations: Not on file    Relationship status: Not on file  . Intimate partner violence:    Fear of current or ex partner: Not on file    Emotionally abused: Not on file    Physically abused: Not on file    Forced sexual activity: Not on file  Other Topics Concern  . Not on file    Social History Narrative  . Not on file     Review of Systems  Constitutional: Negative.   HENT: Negative.   Eyes: Negative.   Respiratory: Positive for cough, chest tightness, shortness of breath and wheezing.   Cardiovascular: Negative for palpitations and leg swelling.  Gastrointestinal: Positive for abdominal pain and constipation. Negative for nausea and vomiting.  Endocrine: Negative.   Musculoskeletal: Positive for back pain.  Skin: Negative.  Negative for color change, pallor, rash and wound.  Allergic/Immunologic: Negative.   Neurological: Negative.  Negative for dizziness and syncope.  Hematological: Negative.   Psychiatric/Behavioral: Negative.   All other systems reviewed and are negative.      Objective:    There were no vitals filed for this visit.  Vitals:   05/28/18 0928  BP: (!) 152/90  Pulse: (!) 101  Temp: 98.3 F (36.8 C)  TempSrc: Oral  SpO2: 94     Physical Exam  Constitutional: She is oriented to person, place, and time. She appears well-developed and well-nourished.  Non-toxic appearance. No distress.  HENT:  Head: Normocephalic and atraumatic.  Right Ear: External ear normal.  Left Ear: External ear normal.  Nose: Nose normal.  Mouth/Throat: Uvula is midline, oropharynx is clear and moist and mucous membranes are normal.  Eyes: Pupils are equal, round, and reactive to light. Conjunctivae, EOM and lids are normal.  Neck: Normal range of motion and phonation normal. Neck supple. No tracheal deviation present.  Cardiovascular: Normal rate, regular rhythm, normal heart sounds and normal pulses. Exam reveals no gallop and no friction rub.  No murmur heard. Pulses:      Radial pulses are 2+ on the right side, and 2+ on the left side.       Posterior tibial pulses are 2+ on the right side, and 2+ on the left side.  Pulmonary/Chest: Effort normal. No accessory muscle usage or stridor. No tachypnea. No respiratory distress. She has wheezes. She  has rhonchi. She has no rales. She exhibits no tenderness.  Patient on 3 L oxygen via nasal cannula, diffuse inspiratory and expiratory wheeze with scattered rhonchi, no rales  Abdominal: Soft. Normal appearance and bowel sounds are normal. She exhibits no distension. There is no tenderness. There is no rebound and no guarding.  Genitourinary: Rectum normal. Rectal exam shows no external hemorrhoid, no internal hemorrhoid, no mass, no tenderness, anal tone normal and guaiac negative stool.  Genitourinary Comments: No fecal impaction palpated with digital rectal exam  Musculoskeletal: Normal range of motion. She exhibits no edema or deformity.  Diffusely tender thoracic and lumbar paraspinal muscles, no spasms palpated, no midline tenderness, normal range of motion throughout neck and back, vision able to climb up and down off of exam table without assistance  Lymphadenopathy:    She has no cervical adenopathy.  Neurological: She is alert and oriented to person, place, and time. She exhibits normal muscle tone. Gait normal.  Skin: Skin is warm, dry and intact. Capillary refill takes less than 2 seconds. No rash noted. She is not diaphoretic. No pallor.  Psychiatric: She has a normal mood and affect. Her speech is normal and behavior is normal.  Nursing note and vitals reviewed.         Assessment & Plan:      ICD-10-CM   1. COPD exacerbation (HCC) J44.1 methylPREDNISolone acetate (DEPO-MEDROL) injection 40 mg    albuterol (PROVENTIL) (2.5 MG/3ML) 0.083% nebulizer solution    Tiotropium Bromide Monohydrate (SPIRIVA RESPIMAT) 2.5 MCG/ACT AERS    ipratropium-albuterol (DUONEB) 0.5-2.5 (3) MG/3ML nebulizer solution 3 mL    albuterol (PROVENTIL) (2.5 MG/3ML) 0.083% nebulizer solution 2.5 mg  2. Chronic hypoxemic respiratory failure (HCC) J96.11 albuterol (PROVENTIL) (2.5 MG/3ML) 0.083% nebulizer solution    Tiotropium Bromide Monohydrate (SPIRIVA RESPIMAT) 2.5 MCG/ACT AERS  3. Other  constipation K59.09    chronic and secondary to narcotic use  4. Abdominal pain, unspecified abdominal location R10.9   5. Chronic bilateral low back pain without sciatica M54.5    G89.29     The patient is likely at her baseline for COPD, does feel better in the clinic after getting DuoNeb's, encouraged her to use more breathing treatments at home.  At her baseline with using 3 L oxygen via nasal cannula, she has already had steroid burst, multiple ER visits, negative CXR on 05/25/18, negative DVT study, renal stone study showed nonobstructing bilateral nephrolithiasis, no hydronephrosis, old moderate L3 compression fracture, moderate to severe stool burden.  Patient main acute issue appears to be severe constipation which is only being made worse by multiple ER visits, multiple prescriptions of narcotics.  She was instructed to do MiraLAX and enema at home, if unable to have a bowel movement over the weekend she needed to go to the ER for assistance with administration of enema. In the office patient had no fecal impaction palpable with digital rectal exam.  Urged patient to avoid narcotic use, increase fluids.  Follow-up next week   Delsa Grana, PA-C 05/28/18 9:27 AM   08/25/18 6:14 PM Vital signs were missing from final OV note, but remained in prior note with error that was deleted.  These VS were transferred into chart for completion

## 2018-05-28 NOTE — Telephone Encounter (Signed)
She has lasix, she should take 1 a day, this should decrease her swelling and help her BP come down, needs F/U Next week- Tuesday/Wed

## 2018-05-31 ENCOUNTER — Other Ambulatory Visit: Payer: Self-pay | Admitting: Family Medicine

## 2018-05-31 DIAGNOSIS — K219 Gastro-esophageal reflux disease without esophagitis: Secondary | ICD-10-CM

## 2018-06-02 ENCOUNTER — Encounter: Payer: Self-pay | Admitting: Family Medicine

## 2018-06-02 ENCOUNTER — Other Ambulatory Visit: Payer: Self-pay

## 2018-06-02 ENCOUNTER — Ambulatory Visit (INDEPENDENT_AMBULATORY_CARE_PROVIDER_SITE_OTHER): Payer: Medicare Other | Admitting: Family Medicine

## 2018-06-02 VITALS — BP 146/92 | HR 116 | Temp 98.8°F | Resp 22

## 2018-06-02 DIAGNOSIS — J441 Chronic obstructive pulmonary disease with (acute) exacerbation: Secondary | ICD-10-CM

## 2018-06-02 DIAGNOSIS — J9611 Chronic respiratory failure with hypoxia: Secondary | ICD-10-CM

## 2018-06-02 DIAGNOSIS — K5900 Constipation, unspecified: Secondary | ICD-10-CM | POA: Diagnosis not present

## 2018-06-02 DIAGNOSIS — R0600 Dyspnea, unspecified: Secondary | ICD-10-CM | POA: Diagnosis not present

## 2018-06-02 DIAGNOSIS — M7989 Other specified soft tissue disorders: Secondary | ICD-10-CM

## 2018-06-02 DIAGNOSIS — B370 Candidal stomatitis: Secondary | ICD-10-CM

## 2018-06-02 MED ORDER — FLUCONAZOLE 150 MG PO TABS: 150.0000 mg | ORAL_TABLET | Freq: Once | ORAL | 1 refills | Status: AC

## 2018-06-02 MED ORDER — FUROSEMIDE 40 MG PO TABS: 40.0000 mg | ORAL_TABLET | Freq: Every day | ORAL | 3 refills | Status: DC

## 2018-06-02 MED ORDER — AMOXICILLIN 875 MG PO TABS: 875.0000 mg | ORAL_TABLET | Freq: Two times a day (BID) | ORAL | 0 refills | Status: DC

## 2018-06-02 NOTE — Progress Notes (Deleted)
Patient ID: AFTIN LYE, female DOB: [birth year], 62 y.o. MRN: [id number]  PCP: Alycia Rossetti, MD      Chief Complaint  Patient presents with  . COPD  . Hospitalization Follow-up    Patient states she has pain in back in rib area when moving it goes to her abdomen and takes her breath away.   Subjective:  Cindy [name] is a 62 y.o. female, presents to clinic with CC of SOB secondary to acute on chronic respiratory failure with COPD, is on 3 L of oxygen, was seen in the ER 3 days ago for same complaint, she does see pulmonology as well who had recently increase her steroids without improvement of her breathing  ER visit from 05/25/2018 reviewed pertinent for the following:  She had a negative DVT study done 3 days ago, chest x-ray was pertinent for mild interstitial prominence and lungs hyperexpanded, no change from her prior studies, no effusion, no edema. CT renal stone study showed moderate amount of retained large bowel stool without bowel obstruction.  Of note patient has multiple phone calls documented to several providers since her ER visit seeking for pain medication because of severe pain to her abdomen and back. Is prescribed Percocet. She was given a shot of pain medicine in the ER but they were hesitant with concern for worsening constipation.  She states that she has tried suppositories and stool softeners without any bowel movement for at least 5 days.  I did review her ER visit 2 days ago and we attempted to call the patient to instruct her to use a MiraLAX bowel cleanse and if that did not work, to use max citrate, we could not get a hold of her and we do not know if she got her messages.  She was seen in the ER again yesterday back pain and spasms. The ER she received breathing treatments again, Dilaudid IM and then dilaudid Rx and Golytely, instructed to f/up here.  Her complaints here today in clinic are unchanged from her recent visits. She continues to complain about back  spasms that are not improved with Robaxin. She complains of constipation and abdominal pain. She also complains of shortness of breath that she states is improved only when given the DuoNeb's in clinic or in hospital but otherwise has not significantly improved since her initial visit to the ER 4 days ago. She is still using the same amount of oxygen, denies any worsening exertional dyspnea from her baseline.      Patient Active Problem List   Diagnosis Date Noted  . 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 Leg swelling 04/15/2016  . Rash and nonspecific skin eruption 08/15/2014  . Ureteral stone with hydronephrosis 05/30/2013  . Allergic rhinitis 04/11/2013  . Ecchymosis 04/11/2013  . Kidney stones 04/11/2013  . Lumbar back pain 03/03/2013  . Compression fracture of L3 lumbar vertebra 03/03/2013  . Unspecified constipation 03/03/2013  . Osteopenia 03/16/2012  . GERD (gastroesophageal reflux disease) 03/16/2012  . Thrush 03/02/2012  . Chronic hypoxemic respiratory failure ([place]) 02/27/2012  . Hyperlipidemia 02/25/2012  . Hyperglycemia, drug-induced 09/25/2011  . Tobacco abuse 09/24/2011  . COPD (chronic obstructive pulmonary disease) ([place]) 12/24/2009          Prior to Admission medications   Medication Sig Start Date End Date Taking? Authorizing Provider  albuterol (PROVENTIL HFA;VENTOLIN HFA) 108 (90 Base) MCG/ACT inhaler Inhale 2 puffs into the lungs every 6 (six) hours as needed for wheezing or shortness of breath. 05/18/18  Collene Gobble, MD  albuterol (PROVENTIL) (2.5 MG/3ML) 0.083% nebulizer solution Take 3 mLs (2.5 mg total) by nebulization every 4 (four) hours as needed for wheezing or shortness of breath. 10/13/17 02/23/20  Alycia Rossetti, MD  alendronate (FOSAMAX) 70 MG tablet take 1 tablet by mouth every week on an empty stomach with 6 12/21/17   Alycia Rossetti, MD  aspirin EC 81 MG tablet Take 81 mg by mouth every morning.     [provider]    budesonide-formoterol (SYMBICORT) 160-4.5 MCG/ACT inhaler Inhale 2 puffs into the lungs 2 (two) times daily. 04/27/18   Alycia Rossetti, MD  Calcium Carb-Cholecalciferol 600-800 MG-UNIT TABS Take 2 tablets by mouth daily.     [provider]  diphenhydrAMINE (BENADRYL) 12.5 MG/5ML liquid Take 6.25 mg by mouth 4 (four) times daily as needed.    [provider]  furosemide (LASIX) 20 MG tablet Take 1 tablet (20 mg total) by mouth daily.  Patient taking differently: Take 20 mg by mouth daily as needed.  10/13/17   [place], Modena Nunnery, MD  HYDROmorphone (DILAUDID) 4 MG tablet Take 1 tablet (4 mg total) by mouth every 6 (six) hours as needed for severe pain. 05/27/18   Milton Ferguson, MD  methocarbamol (ROBAXIN) 500 MG tablet Take 2 tablets (1,000 mg total) by mouth 4 (four) times daily as needed for muscle spasms (muscle spasm/pain). 05/25/18   Francine Graven, DO  nystatin (MYCOSTATIN) 100000 UNIT/ML suspension Use as directed 5 mLs (500,000 Units total) in the mouth or throat 4 (four) times daily. 01/29/18   Alycia Rossetti, MD  oxyCODONE-acetaminophen (PERCOCET/ROXICET) 5-325 MG tablet take 1 tablet by mouth every 8 hours if needed for pain 05/21/18   Vic Blackbird F, MD  OXYGEN Inhale 2.5 L into the lungs continuous.     [provider]  pantoprazole (PROTONIX) 40 MG tablet Take 1 tablet (40 mg total) by mouth 2 (two) times daily. 04/29/18   Collene Gobble, MD  polyethylene glycol (GOLYTELY) 236 g solution Drink one 8 ounce glass every hour until you have a bowel movement 05/27/18   Milton Ferguson, MD  predniSONE (DELTASONE) 10 MG tablet TAKE 2 TABLETS BY MOUTH EVERY MORNING WITH FOOD 04/29/18   Collene Gobble, MD  simvastatin (ZOCOR) 40 MG tablet Take 1 tablet (40 mg total) by mouth at bedtime. 04/27/18   Alycia Rossetti, MD  Tiotropium Bromide Monohydrate (SPIRIVA RESPIMAT) 2.5 MCG/ACT AERS INHALE 2 PUFFS INTO THE LUNGS ONCE DAILY 04/27/18   Alycia Rossetti, MD         Allergies  Allergen Reactions  . Keflex [Cephalexin] Anaphylaxis and Rash  . Doxycycline Other (See Comments)    Gave patient oral thrush  . Sulfa Antibiotics   . Avelox [Moxifloxacin Hcl In Nacl] Itching and Rash  . Toradol [Ketorolac Tromethamine] Swelling, Rash and Other (See Comments)    Bruising, swelling, rash at injection site        Family History  Problem Relation Age of Onset  . Heart disease Mother   . Hypertension Sister   . Hypertension Brother    Social History        Socioeconomic History  . Marital status: Divorced    Spouse name: Not on file  . Number of children: 3  . Years of education: Not on file  . Highest education level: Not on file  Occupational History  . Occupation: disabled    Employer: DISABLED  Social Needs  .  Financial resource strain: Not on file  . Food insecurity:    Worry: Not on file    Inability: Not on file  . Transportation needs:    Medical: Not on file    Non-medical: Not on file  Tobacco Use  . Smoking status: Current Every Day Smoker    Packs/day: 1.00    Years: 25.00    Pack years: 25.00    Types: Cigarettes  . Smokeless tobacco: Never Used  Substance and Sexual Activity  . Alcohol use: No  . Drug use: No  . Sexual activity: Not Currently    Birth control/protection: Surgical  Lifestyle  . Physical activity:    Days per week: Not on file    Minutes per session: Not on file  . Stress: Not on file  Relationships  . Social connections:    Talks on phone: Not on file    Gets together: Not on file    Attends religious service: Not on file    Active member of club or organization: Not on file    Attends meetings of clubs or organizations: Not on file    Relationship status: Not on file  . Intimate partner violence:    Fear of current or ex partner: Not on file    Emotionally abused: Not on file    Physically abused: Not on file    Forced sexual activity: Not on file  Other Topics Concern  . Not on file   Social History Narrative  . Not on file   Review of Systems  Constitutional: Negative.  HENT: Negative.  Eyes: Negative.  Respiratory: Positive for cough, chest tightness, shortness of breath and wheezing.  Cardiovascular: Negative for palpitations and leg swelling.  Gastrointestinal: Positive for abdominal pain and constipation. Negative for nausea and vomiting.  Endocrine: Negative.  Musculoskeletal: Positive for back pain.  Skin: Negative. Negative for color change, pallor, rash and wound.  Allergic/Immunologic: Negative.  Neurological: Negative. Negative for dizziness and syncope.  Hematological: Negative.  Psychiatric/Behavioral: Negative.  All other systems reviewed and are negative.   Objective:  There were no vitals filed for this visit.  Physical Exam  Constitutional: She is oriented to person, place, and time. She appears well-developed and well-nourished. Non-toxic appearance. No distress.  HENT:  Head: Normocephalic and atraumatic.  Right Ear: External ear normal.  Left Ear: External ear normal.  Nose: Nose normal.  Mouth/Throat: Uvula is midline, oropharynx is clear and moist and mucous membranes are normal.  Eyes: Pupils are equal, round, and reactive to light. Conjunctivae, EOM and lids are normal.  Neck: Normal range of motion and phonation normal. Neck supple. No tracheal deviation present.  Cardiovascular: Normal rate, regular rhythm, normal heart sounds and normal pulses. Exam reveals no gallop and no friction rub.  No murmur heard.  Pulses:  Radial pulses are 2+ on the right side, and 2+ on the left side.  Posterior tibial pulses are 2+ on the right side, and 2+ on the left side.  Pulmonary/Chest: Effort normal. No accessory muscle usage or stridor. No tachypnea. No respiratory distress. She has wheezes. She has rhonchi. She has no rales. She exhibits no tenderness.  Patient on 3 L oxygen via nasal cannula, diffuse inspiratory and expiratory wheeze with  scattered rhonchi, no rales  Abdominal: Soft. Normal appearance and bowel sounds are normal. She exhibits no distension. There is no tenderness. There is no rebound and no guarding.  Genitourinary: Rectum normal. Rectal exam shows no external hemorrhoid, no internal  hemorrhoid, no mass, no tenderness, anal tone normal and guaiac negative stool.  Genitourinary Comments: No fecal impaction palpated with digital rectal exam  Musculoskeletal: Normal range of motion. She exhibits no edema or deformity.  Diffusely tender thoracic and lumbar paraspinal muscles, no spasms palpated, no midline tenderness, normal range of motion throughout neck and back, vision able to climb up and down off of exam table without assistance  Lymphadenopathy:  She has no cervical adenopathy.  Neurological: She is alert and oriented to person, place, and time. She exhibits normal muscle tone. Gait normal.  Skin: Skin is warm, dry and intact. Capillary refill takes less than 2 seconds. No rash noted. She is not diaphoretic. No pallor.  Psychiatric: She has a normal mood and affect. Her speech is normal and behavior is normal.  Nursing note and vitals reviewed.   Assessment & Plan:     ICD-10-CM   1. COPD exacerbation (HCC) J44.1 methylPREDNISolone acetate (DEPO-MEDROL) injection 40 mg    albuterol (PROVENTIL) (2.5 MG/3ML) 0.083% nebulizer solution    Tiotropium Bromide Monohydrate (SPIRIVA RESPIMAT) 2.5 MCG/ACT AERS    ipratropium-albuterol (DUONEB) 0.5-2.5 (3) MG/3ML nebulizer solution 3 mL    albuterol (PROVENTIL) (2.5 MG/3ML) 0.083% nebulizer solution 2.5 mg  2. Chronic hypoxemic respiratory failure (HCC) J96.11 albuterol (PROVENTIL) (2.5 MG/3ML) 0.083% nebulizer solution    Tiotropium Bromide Monohydrate (SPIRIVA RESPIMAT) 2.5 MCG/ACT AERS  3. Other constipation K59.09    chronic and secondary to narcotic use  4. Abdominal pain, unspecified abdominal location R10.9   5. Chronic bilateral low back pain without  sciatica M54.5    G89.29    The patient is likely at her baseline for COPD, does feel better in the clinic after getting DuoNeb's, encouraged her to use more breathing treatments at home. At her baseline with using 3 L oxygen via nasal cannula, she has already had steroid burst, multiple ER visits, negative CXR on 05/25/18, negative DVT study, renal stone study showed nonobstructing bilateral nephrolithiasis, no hydronephrosis, old moderate L3 compression fracture, moderate to severe stool burden.  Patient main acute issue appears to be severe constipation which is only being made worse by multiple ER visits, multiple prescriptions of narcotics.  She was instructed to do MiraLAX and enema at home, if unable to have a bowel movement over the weekend she needed to go to the ER for assistance with administration of enema.  In the office patient had no fecal impaction palpable with digital rectal exam. Urged patient to avoid narcotic use, increase fluids. Follow-up next week  Delsa Grana, PA-C  05/28/18 9:27 AM

## 2018-06-02 NOTE — Patient Instructions (Addendum)
Take the hydrocodone cough medicine Take amoxicllin Use the nystatin for thrush  Take the miralax twice a day  For the swelling- Take lasix 40mg  once a day ( Take 2 of 20mg  pills once a day ) We will call with lab results  F/U 2 weeks

## 2018-06-02 NOTE — Progress Notes (Deleted)
Patient ID: Cindy [name], female    DOB: [birth year], 62 y.o.   MRN: [id number]  PCP: Alycia Rossetti, MD  Chief Complaint  Patient presents with  . COPD  . Hospitalization Follow-up    Patient states she has pain in back in rib area when moving it goes to her abdomen and takes her breath away.    Subjective:   Cindy [name] is a 62 y.o. female, presents to clinic with CC of SOB secondary to acute on chronic respiratory failure with COPD, is on 3 L of oxygen, was seen in the ER 3 days ago for same complaint, she does see pulmonology as well who had recently increase her steroids without improvement of her breathing  ER visit from 05/25/2018 reviewed pertinent for the following: She had a negative DVT study done 3 days ago, chest x-ray was pertinent for mild interstitial prominence and lungs hyperexpanded, no change from her prior studies, no effusion, no edema.  CT renal stone study showed moderate amount of retained large bowel stool without bowel obstruction.  Of note patient has multiple phone calls documented to several providers since her ER visit seeking for pain medication because of severe pain to her abdomen and back.  Is prescribed Percocet.  She was given a shot of pain medicine in the ER but they were hesitant with concern for worsening constipation.    She states that she has tried suppositories and stool softeners without any bowel movement for at least 5 days.    I did review her ER visit 2 days ago and we attempted to call the patient to instruct her to use a MiraLAX bowel cleanse and if that did not work, to use max citrate, we could not get a hold of her and we do not know if she got her messages.  She was seen in the ER again yesterday back pain and spasms.  The ER she received breathing treatments again, Dilaudid injetion and then dilaudid Rx and Golytely, instructed to f/up here.    Patient Active Problem List   Diagnosis Date Noted  . Secondary pulmonary  hypertension 05/01/2016  . Leg swelling 04/15/2016  . Rash and nonspecific skin eruption 08/15/2014  . Ureteral stone with hydronephrosis 05/30/2013  . Allergic rhinitis 04/11/2013  . Ecchymosis 04/11/2013  . Kidney stones 04/11/2013  . Lumbar back pain 03/03/2013  . Compression fracture of L3 lumbar vertebra 03/03/2013  . Unspecified constipation 03/03/2013  . Osteopenia 03/16/2012  . GERD (gastroesophageal reflux disease) 03/16/2012  . Thrush 03/02/2012  . Chronic hypoxemic respiratory failure ([place]) 02/27/2012  . Hyperlipidemia 02/25/2012  . Hyperglycemia, drug-induced 09/25/2011  . Tobacco abuse 09/24/2011  . COPD (chronic obstructive pulmonary disease) ([place]) 12/24/2009     Prior to Admission medications   Medication Sig Start Date End Date Taking? Authorizing Provider  albuterol (PROVENTIL HFA;VENTOLIN HFA) 108 (90 Base) MCG/ACT inhaler Inhale 2 puffs into the lungs every 6 (six) hours as needed for wheezing or shortness of breath. 05/18/18   [place], Rose Fillers, MD  albuterol (PROVENTIL) (2.5 MG/3ML) 0.083% nebulizer solution Take 3 mLs (2.5 mg total) by nebulization every 4 (four) hours as needed for wheezing or shortness of breath. 10/13/17 02/23/20  Alycia Rossetti, MD  alendronate (FOSAMAX) 70 MG tablet take 1 tablet by mouth every week on an empty stomach with 6 12/21/17   Alycia Rossetti, MD  aspirin EC 81 MG tablet Take 81 mg by mouth every morning.  [provider]  budesonide-formoterol (SYMBICORT) 160-4.5 MCG/ACT inhaler Inhale 2 puffs into the lungs 2 (two) times daily. 04/27/18   Alycia Rossetti, MD  Calcium Carb-Cholecalciferol 600-800 MG-UNIT TABS Take 2 tablets by mouth daily.     [provider]  diphenhydrAMINE (BENADRYL) 12.5 MG/5ML liquid Take 6.25 mg by mouth 4 (four) times daily as needed.    [provider]  furosemide (LASIX) 20 MG tablet Take 1 tablet (20 mg total) by mouth daily. Patient taking differently: Take 20 mg by mouth  daily as needed.  10/13/17   [place], Modena Nunnery, MD  HYDROmorphone (DILAUDID) 4 MG tablet Take 1 tablet (4 mg total) by mouth every 6 (six) hours as needed for severe pain. 05/27/18   Milton Ferguson, MD  methocarbamol (ROBAXIN) 500 MG tablet Take 2 tablets (1,000 mg total) by mouth 4 (four) times daily as needed for muscle spasms (muscle spasm/pain). 05/25/18   Francine Graven, DO  nystatin (MYCOSTATIN) 100000 UNIT/ML suspension Use as directed 5 mLs (500,000 Units total) in the mouth or throat 4 (four) times daily. 01/29/18   Alycia Rossetti, MD  oxyCODONE-acetaminophen (PERCOCET/ROXICET) 5-325 MG tablet take 1 tablet by mouth every 8 hours if needed for pain 05/21/18   Vic Blackbird F, MD  OXYGEN Inhale 2.5 L into the lungs continuous.     [provider]  pantoprazole (PROTONIX) 40 MG tablet Take 1 tablet (40 mg total) by mouth 2 (two) times daily. 04/29/18   Collene Gobble, MD  polyethylene glycol (GOLYTELY) 236 g solution Drink one 8 ounce glass every hour until you have a bowel movement 05/27/18   Milton Ferguson, MD  predniSONE (DELTASONE) 10 MG tablet TAKE 2 TABLETS BY MOUTH EVERY MORNING WITH FOOD 04/29/18   Collene Gobble, MD  simvastatin (ZOCOR) 40 MG tablet Take 1 tablet (40 mg total) by mouth at bedtime. 04/27/18   Alycia Rossetti, MD  Tiotropium Bromide Monohydrate (SPIRIVA RESPIMAT) 2.5 MCG/ACT AERS INHALE 2 PUFFS INTO THE LUNGS ONCE DAILY 04/27/18   Alycia Rossetti, MD     Allergies  Allergen Reactions  . Keflex [Cephalexin] Anaphylaxis and Rash  . Doxycycline Other (See Comments)    Gave patient oral thrush  . Sulfa Antibiotics   . Avelox [Moxifloxacin Hcl In Nacl] Itching and Rash  . Toradol [Ketorolac Tromethamine] Swelling, Rash and Other (See Comments)    Bruising, swelling, rash at injection site     Family History  Problem Relation Age of Onset  . Heart disease Mother   . Hypertension Sister   . Hypertension Brother      Social History   Socioeconomic  History  . Marital status: Divorced    Spouse name: Not on file  . Number of children: 3  . Years of education: Not on file  . Highest education level: Not on file  Occupational History  . Occupation: disabled    Employer: DISABLED  Social Needs  . Financial resource strain: Not on file  . Food insecurity:    Worry: Not on file    Inability: Not on file  . Transportation needs:    Medical: Not on file    Non-medical: Not on file  Tobacco Use  . Smoking status: Current Every Day Smoker    Packs/day: 1.00    Years: 25.00    Pack years: 25.00    Types: Cigarettes  . Smokeless tobacco: Never Used  Substance and Sexual Activity  . Alcohol use: No  .  Drug use: No  . Sexual activity: Not Currently    Birth control/protection: Surgical  Lifestyle  . Physical activity:    Days per week: Not on file    Minutes per session: Not on file  . Stress: Not on file  Relationships  . Social connections:    Talks on phone: Not on file    Gets together: Not on file    Attends religious service: Not on file    Active member of club or organization: Not on file    Attends meetings of clubs or organizations: Not on file    Relationship status: Not on file  . Intimate partner violence:    Fear of current or ex partner: Not on file    Emotionally abused: Not on file    Physically abused: Not on file    Forced sexual activity: Not on file  Other Topics Concern  . Not on file  Social History Narrative  . Not on file     Review of Systems     Objective:    Vitals:   05/28/18 0928  BP: (!) 152/90  Pulse: (!) 101  Temp: 98.3 F (36.8 C)  TempSrc: Oral  SpO2: 94%      Physical Exam  Constitutional: She is oriented to person, place, and time. She appears well-developed and well-nourished.  Non-toxic appearance. No distress.  HENT:  Head: Normocephalic and atraumatic.  Right Ear: External ear normal.  Left Ear: External ear normal.  Nose: Nose normal.  Mouth/Throat: Uvula  is midline, oropharynx is clear and moist and mucous membranes are normal.  Eyes: Pupils are equal, round, and reactive to light. Conjunctivae, EOM and lids are normal.  Neck: Normal range of motion and phonation normal. Neck supple. No tracheal deviation present.  Cardiovascular: Normal rate, regular rhythm, normal heart sounds and normal pulses. Exam reveals no gallop and no friction rub.  No murmur heard. Pulses:      Radial pulses are 2+ on the right side, and 2+ on the left side.       Posterior tibial pulses are 2+ on the right side, and 2+ on the left side.  Pulmonary/Chest: Effort normal and breath sounds normal. No stridor. No respiratory distress. She has no wheezes. She has no rhonchi. She has no rales. She exhibits no tenderness.  Abdominal: Soft. Normal appearance and bowel sounds are normal. She exhibits no distension. There is no tenderness. There is no rebound and no guarding.  Musculoskeletal: Normal range of motion. She exhibits no edema or deformity.  Lymphadenopathy:    She has no cervical adenopathy.  Neurological: She is alert and oriented to person, place, and time. She exhibits normal muscle tone. Gait normal.  Skin: Skin is warm, dry and intact. Capillary refill takes less than 2 seconds. No rash noted. She is not diaphoretic. No pallor.  Psychiatric: She has a normal mood and affect. Her speech is normal and behavior is normal.     Digital rectal exam done, no stool palpated in rectal vault, mildly tender rectum on exam     Assessment & Plan:      ICD-10-CM   1. COPD exacerbation (HCC) J44.1 methylPREDNISolone acetate (DEPO-MEDROL) injection 40 mg    albuterol (PROVENTIL) (2.5 MG/3ML) 0.083% nebulizer solution    Tiotropium Bromide Monohydrate (SPIRIVA RESPIMAT) 2.5 MCG/ACT AERS    ipratropium-albuterol (DUONEB) 0.5-2.5 (3) MG/3ML nebulizer solution 3 mL    albuterol (PROVENTIL) (2.5 MG/3ML) 0.083% nebulizer solution 2.5 mg  2. Chronic  hypoxemic respiratory  failure (HCC) J96.11 albuterol (PROVENTIL) (2.5 MG/3ML) 0.083% nebulizer solution    Tiotropium Bromide Monohydrate (SPIRIVA RESPIMAT) 2.5 MCG/ACT AERS  3. Other constipation K59.09    chronic and secondary to narcotic use  4. Abdominal pain, unspecified abdominal location R10.9   5. Chronic bilateral low back pain without sciatica M54.5    G89.29        Delsa Grana, PA-C 06/02/18 10:30 AM

## 2018-06-02 NOTE — Progress Notes (Signed)
Subjective:    Patient ID: Cindy [name], female    DOB: [birth year], 62 y.o.   MRN: [id number]  Patient presents for Back Pain (went to ER for muscle spaasm- states that it hasnot improved) and COPD Exacerbation   Pt here for F/U has been back and forth, to ER and our office    Constipation- took prune juice last night, align,maalox  fiber one, Linzess, now out of linzess  Last Friday took Magnesium citrate after her OV, states she had 4 BM She was told to take miralax BID but did not do so, then stated she just got it??   COPD- using 3 Liters, using nebulizer every 4 hours   using spiriva and symicort  Also on chronic prednisone of 20mg  once a day  Continues to productive cough, states it is getting worse, more SOB   HTN- over past month, mostly in ER BP has been elevated up tp 180/90's, also leg bilat leg and swelling , jas chronic swelling in left leg and now worse in right leg  has had swelling in hands Advised to take lasix daily last week, she only took it some days    Has friend at home that helps and stays with her ,she refuses to have [place] or [facility] in home to assist with her end stage COPD   REVIEWED- ER records from past 2 visits 5/28, 5/30  CXR  NEG    Ultrasound - Legs Norma CT Abd/renal- kidney stones- non obstructiving, Constipation      Review Of Systems:  GEN- denies fatigue, fever, weight loss,weakness, recent illness HEENT- denies eye drainage, change in vision, nasal discharge, CVS- denies chest pain, palpitations RESP- + SOB, +cough, +wheeze ABD- denies N/V, +change in stools, abd pain GU- denies dysuria, hematuria, dribbling, incontinence MSK- + joint pain, muscle aches, injury Neuro- denies headache, dizziness, syncope, seizure activity       Objective:    BP (!) 146/92   Pulse (!) 116   Temp 98.8 F (37.1 C) (Oral)   Resp (!) 22   SpO2 99% Comment: 3L/min via [place] GEN- NAD, alert and oriented x3,obese, chronically ill appearing ,sitting in  wheelchair HEENT- PERRL, EOMI, non injected sclera, pink conjunctiva, MMM, oropharynx thrush wearing 3L  Neck- Supple,  CVS- tacycardia HR 100 at rest, no murmur RESP-wheezing, SOB with little exertion, normal sat on 3L, mild rhonchi ABD-NABS,soft,NT, large pannus EXT- pitting  Edema right above ankle and feet bilat Pulses- Radial, DP- 2+        Assessment & Plan:      Problem List Items Addressed This Visit      Unprioritized   Chronic hypoxemic respiratory failure (HCC)   Leg swelling    Has had multiuple times in the past This is MTF, she does not ambulate much  She is on chronic prednisone Excessive weight gain past few months  Increase lasix to 40mg  once a day /elevate legs      Relevant Orders   Brain natriuretic peptide (Completed)   CBC with Differential/Platelet (Completed)   Basic metabolic panel (Completed)   Thrush - Primary    Treatment nystatin       Other Visit Diagnoses    COPD exacerbation ([place])       add amoxicillin , continue nebs, oxygen, prednisone 20mg , declines help in home, she is honestly near baseline, so will not send back to ER at this time   Dyspnea, unspecified type  Relevant Orders   Brain natriuretic peptide (Completed)   CBC with Differential/Platelet (Completed)   Basic metabolic panel (Completed)   Constipation, unspecified constipation type       discussed importance of taking meds as prescribed, she keeps changing things or skipping doses making it difficult to treat her. MiRALAX BID for constipation, fiber- one a day       Note: This dictation was prepared with Dragon dictation along with smaller phrase technology. Any transcriptional errors that result from this process are unintentional.

## 2018-06-03 ENCOUNTER — Encounter: Payer: Self-pay | Admitting: Family Medicine

## 2018-06-03 LAB — CBC WITH DIFFERENTIAL/PLATELET
Basophils Absolute: 70 cells/uL (ref 0–200)
Basophils Relative: 0.4 %
Eosinophils Absolute: 35 cells/uL (ref 15–500)
Eosinophils Relative: 0.2 %
HCT: 42.8 % (ref 35.0–45.0)
Hemoglobin: 14.4 g/dL (ref 11.7–15.5)
Lymphs Abs: 1435 cells/uL (ref 850–3900)
MCH: 31.6 pg (ref 27.0–33.0)
MCHC: 33.6 g/dL (ref 32.0–36.0)
MCV: 93.9 fL (ref 80.0–100.0)
MPV: 12.5 fL (ref 7.5–12.5)
Monocytes Relative: 8.2 %
Neutro Abs: 14525 cells/uL — ABNORMAL HIGH (ref 1500–7800)
Neutrophils Relative %: 83 %
Platelets: 216 10*3/uL (ref 140–400)
RBC: 4.56 10*6/uL (ref 3.80–5.10)
RDW: 12.9 % (ref 11.0–15.0)
Total Lymphocyte: 8.2 %
WBC mixed population: 1435 cells/uL — ABNORMAL HIGH (ref 200–950)
WBC: 17.5 10*3/uL — ABNORMAL HIGH (ref 3.8–10.8)

## 2018-06-03 LAB — BASIC METABOLIC PANEL
BUN: 12 mg/dL (ref 7–25)
CO2: 40 mmol/L — ABNORMAL HIGH (ref 20–32)
Calcium: 10.2 mg/dL (ref 8.6–10.4)
Chloride: 93 mmol/L — ABNORMAL LOW (ref 98–110)
Creat: 0.79 mg/dL (ref 0.50–0.99)
Glucose, Bld: 143 mg/dL — ABNORMAL HIGH (ref 65–99)
Potassium: 4.6 mmol/L (ref 3.5–5.3)
Sodium: 140 mmol/L (ref 135–146)

## 2018-06-03 LAB — BRAIN NATRIURETIC PEPTIDE: Brain Natriuretic Peptide: 12 pg/mL (ref <100)

## 2018-06-03 NOTE — Assessment & Plan Note (Signed)
Treatment nystatin

## 2018-06-03 NOTE — Assessment & Plan Note (Addendum)
Has had multiuple times in the past This is MTF, she does not ambulate much  She is on chronic prednisone Excessive weight gain past few months  Increase lasix to 40mg  once a day /elevate legs

## 2018-06-04 ENCOUNTER — Telehealth: Payer: Self-pay | Admitting: Family Medicine

## 2018-06-04 ENCOUNTER — Emergency Department (HOSPITAL_COMMUNITY): Payer: Medicare Other

## 2018-06-04 ENCOUNTER — Other Ambulatory Visit: Payer: Self-pay

## 2018-06-04 ENCOUNTER — Encounter (HOSPITAL_COMMUNITY): Payer: Self-pay | Admitting: Emergency Medicine

## 2018-06-04 ENCOUNTER — Emergency Department (HOSPITAL_COMMUNITY)
Admission: EM | Admit: 2018-06-04 | Discharge: 2018-06-04 | Disposition: A | Discharge status: Home / Self Care | Payer: Medicare Other | Attending: Emergency Medicine | Admitting: Emergency Medicine

## 2018-06-04 DIAGNOSIS — Z7982 Long term (current) use of aspirin: Secondary | ICD-10-CM | POA: Insufficient documentation

## 2018-06-04 DIAGNOSIS — N200 Calculus of kidney: Secondary | ICD-10-CM | POA: Diagnosis not present

## 2018-06-04 DIAGNOSIS — F17210 Nicotine dependence, cigarettes, uncomplicated: Secondary | ICD-10-CM | POA: Diagnosis not present

## 2018-06-04 DIAGNOSIS — M545 Low back pain, unspecified: Secondary | ICD-10-CM

## 2018-06-04 DIAGNOSIS — Z79899 Other long term (current) drug therapy: Secondary | ICD-10-CM | POA: Diagnosis not present

## 2018-06-04 DIAGNOSIS — R000 Tachycardia, unspecified: Secondary | ICD-10-CM | POA: Insufficient documentation

## 2018-06-04 DIAGNOSIS — R109 Unspecified abdominal pain: Secondary | ICD-10-CM | POA: Diagnosis not present

## 2018-06-04 DIAGNOSIS — R140 Abdominal distension (gaseous): Secondary | ICD-10-CM | POA: Diagnosis not present

## 2018-06-04 DIAGNOSIS — J449 Chronic obstructive pulmonary disease, unspecified: Secondary | ICD-10-CM | POA: Insufficient documentation

## 2018-06-04 DIAGNOSIS — I700 Atherosclerosis of aorta: Secondary | ICD-10-CM | POA: Diagnosis not present

## 2018-06-04 DIAGNOSIS — R609 Edema, unspecified: Secondary | ICD-10-CM | POA: Insufficient documentation

## 2018-06-04 DIAGNOSIS — K5903 Drug induced constipation: Secondary | ICD-10-CM | POA: Diagnosis not present

## 2018-06-04 LAB — CBC WITH DIFFERENTIAL/PLATELET
BASOS ABS: 0 10*3/uL (ref 0.0–0.1)
BASOS PCT: 0 %
EOS ABS: 0 10*3/uL (ref 0.0–0.7)
Eosinophils Relative: 0 %
HEMATOCRIT: 48.6 % — AB (ref 36.0–46.0)
Hemoglobin: 14.8 g/dL (ref 12.0–15.0)
Lymphocytes Relative: 10 %
Lymphs Abs: 1.5 10*3/uL (ref 0.7–4.0)
MCH: 30.7 pg (ref 26.0–34.0)
MCHC: 30.5 g/dL (ref 30.0–36.0)
MCV: 100.8 fL — ABNORMAL HIGH (ref 78.0–100.0)
MONO ABS: 1 10*3/uL (ref 0.1–1.0)
MONOS PCT: 7 %
NEUTROS ABS: 11.9 10*3/uL — AB (ref 1.7–7.7)
NEUTROS PCT: 83 %
Platelets: 137 10*3/uL — ABNORMAL LOW (ref 150–400)
RBC: 4.82 MIL/uL (ref 3.87–5.11)
RDW: 14.2 % (ref 11.5–15.5)
WBC: 14.5 10*3/uL — ABNORMAL HIGH (ref 4.0–10.5)

## 2018-06-04 LAB — I-STAT CG4 LACTIC ACID, ED: Lactic Acid, Venous: 1.91 mmol/L — ABNORMAL HIGH (ref 0.5–1.9)

## 2018-06-04 LAB — URINALYSIS, ROUTINE W REFLEX MICROSCOPIC
BILIRUBIN URINE: NEGATIVE
Glucose, UA: NEGATIVE mg/dL
Hgb urine dipstick: NEGATIVE
Ketones, ur: NEGATIVE mg/dL
Leukocytes, UA: NEGATIVE
NITRITE: NEGATIVE
PROTEIN: NEGATIVE mg/dL
SPECIFIC GRAVITY, URINE: 1.004 — AB (ref 1.005–1.030)
pH: 9 — ABNORMAL HIGH (ref 5.0–8.0)

## 2018-06-04 LAB — COMPREHENSIVE METABOLIC PANEL
ALK PHOS: 67 U/L (ref 38–126)
ALT: 45 U/L (ref 14–54)
ANION GAP: 12 (ref 5–15)
AST: 34 U/L (ref 15–41)
Albumin: 4 g/dL (ref 3.5–5.0)
BILIRUBIN TOTAL: 1 mg/dL (ref 0.3–1.2)
BUN: 11 mg/dL (ref 6–20)
CALCIUM: 9.4 mg/dL (ref 8.9–10.3)
CO2: 35 mmol/L — AB (ref 22–32)
CREATININE: 0.86 mg/dL (ref 0.44–1.00)
Chloride: 90 mmol/L — ABNORMAL LOW (ref 101–111)
GFR calc non Af Amer: >60 mL/min (ref >60)
Glucose, Bld: 141 mg/dL — ABNORMAL HIGH (ref 65–99)
Potassium: 4.6 mmol/L (ref 3.5–5.1)
Sodium: 137 mmol/L (ref 135–145)
TOTAL PROTEIN: 7 g/dL (ref 6.5–8.1)

## 2018-06-04 MED ORDER — IOPAMIDOL (ISOVUE-370) INJECTION 76%
100.0000 mL | Freq: Once | INTRAVENOUS | Status: AC | PRN
  Administered 2018-06-04: 100 mL via INTRAVENOUS

## 2018-06-04 MED ORDER — MORPHINE SULFATE (PF) 4 MG/ML IV SOLN
4.0000 mg | Freq: Once | INTRAVENOUS | Status: AC
  Administered 2018-06-04: 4 mg via INTRAVENOUS
  Filled 2018-06-04: qty 1

## 2018-06-04 MED ORDER — POLYETHYLENE GLYCOL 3350 17 G PO PACK
17.0000 g | PACK | Freq: Once | ORAL | Status: AC
  Administered 2018-06-04: 17 g via ORAL
  Filled 2018-06-04: qty 1

## 2018-06-04 MED ORDER — POLYETHYLENE GLYCOL 3350 17 G PO PACK: 17.0000 g | PACK | Freq: Every day | ORAL | 0 refills | Status: DC

## 2018-06-04 NOTE — ED Provider Notes (Signed)
[facility] EMERGENCY DEPARTMENT Provider Note   CSN: [id number] Arrival date & time: 06/04/18  1307     History   Chief Complaint Chief Complaint  Patient presents with  . Back Pain    HPI Cindy [name] is a 62 y.o. female.  HPI 62 year old with history of advanced COPD-emphysema, arthritis and active 1 pack a day smoking with about 35-pack-year smoking history comes in with chief complaint of abdominal pain and back pain.  Patient reports that she has chronic back pain, however over the past 1 week she has been having new back pain that is located in the upper lumbar region and radiates down towards the lower lumbar region.  The pain also radiates sideways towards the abdomen.  Patient was seen in the ER recently and she had a CT renal stone study which was negative.  Patient has been taking as needed oxycodone with no relief.  Review of system is negative for any burning with urination, blood in the urine, vaginal discharge, vaginal bleeding, lower extremity numbness or tingling, lower extremity weakness, urinary incontinence, urinary retention.  Patient does indicate that she has been constipated with her last bowel movement being 6 days ago.  Patient is currently passing flatus and having small BMs.  Past Medical History:  Diagnosis Date  . 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 Arthritis   . Chronic respiratory failure ([place]) On home 02 2-3L  . Compression fracture of lumbar vertebra ([place])   . COPD (chronic obstructive pulmonary disease) ([place])   . Hyperglycemia, drug-induced   . Hyperlipidemia   . Kidney stones 2014   Left side, multiple  . Osteoporosis 2013  . Shortness of breath   . Thyroid disease    pt denies  . Tobacco abuse     Patient Active Problem List   Diagnosis Date Noted  . 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 Leg swelling 04/15/2016  . Rash and nonspecific skin eruption 08/15/2014  . Ureteral stone with hydronephrosis 05/30/2013  . Allergic  rhinitis 04/11/2013  . Ecchymosis 04/11/2013  . Kidney stones 04/11/2013  . Lumbar back pain 03/03/2013  . Compression fracture of L3 lumbar vertebra 03/03/2013  . Unspecified constipation 03/03/2013  . Osteopenia 03/16/2012  . GERD (gastroesophageal reflux disease) 03/16/2012  . Thrush 03/02/2012  . Chronic hypoxemic respiratory failure ([place]) 02/27/2012  . Hyperlipidemia 02/25/2012  . Hyperglycemia, drug-induced 09/25/2011  . Tobacco abuse 09/24/2011  . COPD (chronic obstructive pulmonary disease) ([place]) 12/24/2009    Past Surgical History:  Procedure Laterality Date  . CERVICAL BIOPSY    . COLONOSCOPY N/A 03/31/2013   Procedure: COLONOSCOPY;  Surgeon: Rogene Houston, MD;  Location: [facility]ITE;  Service: Endoscopy;  Laterality: N/A;  730  . TUBAL LIGATION       OB History   None      Home Medications    Prior to Admission medications   Medication Sig Start Date End Date Taking? Authorizing Provider  albuterol (PROVENTIL HFA;VENTOLIN HFA) 108 (90 Base) MCG/ACT inhaler Inhale 2 puffs into the lungs every 6 (six) hours as needed for wheezing or shortness of breath. 05/18/18   Byrum, Rose Fillers, MD  albuterol (PROVENTIL) (2.5 MG/3ML) 0.083% nebulizer solution Take 3 mLs (2.5 mg total) by nebulization every 4 (four) hours as needed for wheezing or shortness of breath. 10/13/17 02/23/20  Alycia Rossetti, MD  alendronate (FOSAMAX) 70 MG tablet take 1 tablet by mouth every week on an empty stomach with 6 12/21/17  [place], Modena Nunnery, MD  amoxicillin (AMOXIL) 875 MG tablet Take 1 tablet (875 mg total) by mouth 2 (two) times daily. 06/02/18   Alycia Rossetti, MD  aspirin EC 81 MG tablet Take 81 mg by mouth every morning.     [provider]  budesonide-formoterol (SYMBICORT) 160-4.5 MCG/ACT inhaler Inhale 2 puffs into the lungs 2 (two) times daily. 04/27/18   Alycia Rossetti, MD  Calcium Carb-Cholecalciferol 600-800 MG-UNIT TABS Take 2 tablets by mouth daily.     [provider]  cyclobenzaprine (FLEXERIL) 10 MG tablet Take 1 tablet (10 mg total) by mouth 3 (three) times daily as needed for muscle spasms. 05/28/18   Delsa Grana, PA-C  diphenhydrAMINE (BENADRYL) 12.5 MG/5ML liquid Take 6.25 mg by mouth 4 (four) times daily as needed.    [provider]  furosemide (LASIX) 40 MG tablet Take 1 tablet (40 mg total) by mouth daily. 06/02/18   [place], Modena Nunnery, MD  nystatin (MYCOSTATIN) 100000 UNIT/ML suspension Use as directed 5 mLs (500,000 Units total) in the mouth or throat 4 (four) times daily. 01/29/18   Alycia Rossetti, MD  oxyCODONE-acetaminophen (PERCOCET/ROXICET) 5-325 MG tablet take 1 tablet by mouth every 8 hours if needed for pain 05/21/18   Vic Blackbird F, MD  OXYGEN Inhale 2.5 L into the lungs continuous.     [provider]  pantoprazole (PROTONIX) 40 MG tablet Take 1 tablet (40 mg total) by mouth 2 (two) times daily. 04/29/18   Collene Gobble, MD  polyethylene glycol (GOLYTELY) 236 g solution Drink one 8 ounce glass every hour until you have a bowel movement 05/27/18   Milton Ferguson, MD  polyethylene glycol (MIRALAX / GLYCOLAX) packet Take 17 g by mouth daily. 06/04/18   Varney Biles, MD  predniSONE (DELTASONE) 10 MG tablet TAKE 2 TABLETS BY MOUTH EVERY MORNING WITH FOOD 04/29/18   Collene Gobble, MD  simvastatin (ZOCOR) 40 MG tablet Take 1 tablet (40 mg total) by mouth at bedtime. 04/27/18   Alycia Rossetti, MD  Tiotropium Bromide Monohydrate (SPIRIVA RESPIMAT) 2.5 MCG/ACT AERS INHALE 2 PUFFS INTO THE LUNGS ONCE DAILY 05/28/18   Delsa Grana, PA-C    Family History Family History  Problem Relation Age of Onset  . Heart disease Mother   . Hypertension Sister   . Hypertension Brother     Social History Social History   Tobacco Use  . Smoking status: Current Every Day Smoker    Packs/day: 1.00    Years: 25.00    Pack years: 25.00    Types: Cigarettes  . Smokeless tobacco: Never Used  Substance Use Topics  . Alcohol  use: No  . Drug use: No     Allergies   Keflex [cephalexin]; Doxycycline; Sulfa antibiotics; Avelox [moxifloxacin hcl in nacl]; and Toradol [ketorolac tromethamine]   Review of Systems Review of Systems  Constitutional: Positive for activity change.  Respiratory: Positive for cough and shortness of breath.   Cardiovascular: Negative for chest pain.  Gastrointestinal: Positive for abdominal pain and constipation. Negative for nausea and vomiting.  Genitourinary: Negative for flank pain.  Musculoskeletal: Positive for back pain.  Neurological: Negative for dizziness and syncope.     Physical Exam Updated Vital Signs BP (!) 144/103 (BP Location: Left Wrist)   Pulse (!) 113   Temp 98.7 F (37.1 C) (Oral)   Resp 20   Ht 5\' 1"  (1.549 m)   Wt 89.8 kg (198 lb)   SpO2 100%  BMI 37.41 kg/m   Physical Exam  Constitutional: She is oriented to person, place, and time. She appears well-developed.  HENT:  Head: Normocephalic and atraumatic.  Eyes: EOM are normal.  Neck: Normal range of motion. Neck supple.  Cardiovascular: Intact distal pulses.  Tachycardia  Pulmonary/Chest: Effort normal. She has wheezes.  Mild tachypnea  Abdominal: Soft. Bowel sounds are normal. She exhibits distension. There is no tenderness. There is no rebound and no guarding.  Patient has no focal tenderness, and the abdomen is soft.   Musculoskeletal: She exhibits edema.  Pt has tenderness over the lumbar region No step offs, no erythema. Pt has 1+ patellar reflex bilaterally.  No hyperreflexia Able to discriminate between sharp and dull. Able to ambulate  Bilateral lower extremity pitting edema  Neurological: She is alert and oriented to person, place, and time.  Skin: Skin is warm and dry.  Nursing note and vitals reviewed.   ED Treatments / Results  Labs (all labs ordered are listed, but only abnormal results are displayed) Labs Reviewed  URINALYSIS, ROUTINE W REFLEX MICROSCOPIC - Abnormal;  Notable for the following components:      Result Value   Color, Urine STRAW (*)    Specific Gravity, Urine 1.004 (*)    pH 9.0 (*)    All other components within normal limits  COMPREHENSIVE METABOLIC PANEL - Abnormal; Notable for the following components:   Chloride 90 (*)    CO2 35 (*)    Glucose, Bld 141 (*)    All other components within normal limits  CBC WITH DIFFERENTIAL/PLATELET - Abnormal; Notable for the following components:   WBC 14.5 (*)    HCT 48.6 (*)    MCV 100.8 (*)    Platelets 137 (*)    Neutro Abs 11.9 (*)    All other components within normal limits  I-STAT CG4 LACTIC ACID, ED - Abnormal; Notable for the following components:   Lactic Acid, Venous 1.91 (*)    All other components within normal limits  I-STAT CG4 LACTIC ACID, ED    EKG EKG Interpretation  Date/Time:  Friday June 04 2018 16:04:25 EDT Ventricular Rate:  111 PR Interval:    QRS Duration: 82 QT Interval:  336 QTC Calculation: 457 R Axis:   94 Text Interpretation:  Sinus tachycardia Right atrial enlargement Right axis deviation No acute changes Confirmed by Varney Biles [phone]) on 06/04/2018 5:16:42 PM   Radiology Ct Angio Chest/abd/pel For Dissection W And/or W/wo  Result Date: 06/04/2018 CLINICAL DATA:  Abdominal and back pain. EXAM: CT ANGIOGRAPHY CHEST, ABDOMEN AND PELVIS TECHNIQUE: Multidetector CT imaging through the chest, abdomen and pelvis was performed using the standard protocol during bolus administration of intravenous contrast. Multiplanar reconstructed images and MIPs were obtained and reviewed to evaluate the vascular anatomy. CONTRAST:  14mL ISOVUE-370 IOPAMIDOL (ISOVUE-370) INJECTION 76% COMPARISON:  CT abdomen pelvis dated May 25, 2018. Chest x-ray dated May 25, 2018. FINDINGS: CTA CHEST FINDINGS Cardiovascular: Preferential opacification of the thoracic aorta. No evidence of thoracic aortic aneurysm or dissection. Coronary, aortic arch, and branch vessel atherosclerotic  vascular disease. Normal heart size. No pericardial effusion. No pulmonary embolism. Mediastinum/Nodes: No enlarged mediastinal, hilar, or axillary lymph nodes. Calcified right thyroid nodule. The trachea and esophagus demonstrate no significant findings. Lungs/Pleura: No focal consolidation, pleural effusion, or pneumothorax. Moderate centrilobular emphysema. Diffuse peribronchial thickening. Scattered areas of cystic parenchymal scarring throughout both lungs. No suspicious pulmonary nodule. Musculoskeletal: Acute, mild superior endplate compression fracture of T11. No retropulsion. Review  of the MIP images confirms the above findings. CTA ABDOMEN AND PELVIS FINDINGS VASCULAR Aorta: Normal caliber aorta without aneurysm, dissection, vasculitis or significant stenosis. Celiac: Patent without evidence of aneurysm, dissection, vasculitis or significant stenosis. SMA: Patent without evidence of aneurysm, dissection, vasculitis or significant stenosis. Renals: Both renal arteries are patent without evidence of aneurysm, dissection, vasculitis, fibromuscular dysplasia or significant stenosis. IMA: Patent without evidence of aneurysm, dissection, vasculitis or significant stenosis. Inflow: Patent without evidence of aneurysm, dissection, vasculitis or significant stenosis. Veins: No obvious venous abnormality within the limitations of this arterial phase study. Review of the MIP images confirms the above findings. NON-VASCULAR Hepatobiliary: No focal liver abnormality is seen. No gallstones, gallbladder wall thickening, or biliary dilatation. Gallbladder fundal adenomyomatosis again noted. Pancreas: Unremarkable. No pancreatic ductal dilatation or surrounding inflammatory changes. Spleen: Normal in size without focal abnormality. Adrenals/Urinary Tract: Unchanged bilateral adrenal adenomas. Multiple bilateral nonobstructive renal calculi are similar to prior study. No focal renal mass. No hydronephrosis. The bladder is  unremarkable. Stomach/Bowel: Stomach is within normal limits. Appendix appears normal. No evidence of bowel wall thickening, distention, or inflammatory changes. Sigmoid diverticulosis. Large amount of stool throughout the colon. Large duodenal diverticulum. Lymphatic: No enlarged abdominal or pelvic lymph nodes. Reproductive: Uterus and bilateral adnexa are unremarkable. Other: No abdominal wall hernia or abnormality. No abdominopelvic ascites. No pneumoperitoneum. Musculoskeletal: No acute or significant osseous findings. Chronic L3 compression fracture, unchanged. Review of the MIP images confirms the above findings. IMPRESSION: Vascular: 1. No evidence of aortic aneurysm, dissection, or penetrating ulcer. 2.  Aortic atherosclerosis (ICD10-I70.0). Chest: 1. Acute, mild superior endplate compression fracture of T11. No retropulsion. 2.  Emphysema (ICD10-J43.9). Abdomen and pelvis: 1.  No acute intra-abdominal process. 2. Bilateral nonobstructive nephrolithiasis, unchanged. Electronically Signed   By: Titus Dubin M.D.   On: 06/04/2018 18:10    Procedures Procedures (including critical care time)  Medications Ordered in ED Medications  morphine 4 MG/ML injection 4 mg (4 mg Intravenous Given 06/04/18 1627)  iopamidol (ISOVUE-370) 76 % injection 100 mL (100 mLs Intravenous Contrast Given 06/04/18 1737)  polyethylene glycol (MIRALAX / GLYCOLAX) packet 17 g (17 g Oral Given 06/04/18 1919)     Initial Impression / Assessment and Plan / ED Course  I have reviewed the triage vital signs and the nursing notes.  Pertinent labs & imaging results that were available during my care of the patient were reviewed by me and considered in my medical decision making (see chart for details).  Clinical Course as of Jun 04 1938  Fri Jun 04, 2018  1851 White count is slightly elevated, however her lactic acid is normal which is reassuring  Lactic Acid, Venous(!): 1.91 [AN]  1852 CT scan is not showing any acute  abnormalities.   Results from the ER workup discussed with the patient face to face and all questions answered to the best of my ability.   CT Angio Chest/Abd/Pel for Dissection W and/or W/WO [AN]  1934 Had a long discussion with the patient going over her results. I informed her that she absolutely needs to see her primary care doctor for optimal care.  She asked me about her leg swelling, which I think is because of right-sided heart failure given her severe pulmonary hypertension.  Patient is already taking Lasix.  I do not see any reason for [place] to give her more Lasix at this time.  MiraLAX has been given for her constipation.   [AN]  1939 Strict ER return precautions have been  discussed, and patient is agreeing with the plan and is comfortable with the workup done and the recommendations from the ER. She will return to the ER immediately if she starts having worsening of her pain or if she has worsening of her shortness of breath.    [AN]    Clinical Course User Index [AN] Varney Biles, MD    62 year old female with history of chronic respiratory failure, pulmonary hypertension, active smoking with 30+-pack-year smoking history comes in with chief complaint of back pain and abdominal pain.  Given that patient has extensive smoking history, differential diagnosis includes AAA, aortic dissection, pathologic fractures, malignancy, large aortic thrombosis.  Given that she feels bloated and does not have regular BM, differential diagnosis includes ileus, SBO, severe constipation.   Patient is tachycardic and tachypneic, I think it is likely because of her severe emphysema and because of her pulmonary hypertension.  It does not seem like she is decompensated from a cardiopulmonary perspective.  Patient's back exam reveals reproducible tenderness over the entire lumbar spine.  However patient does not elicit any signs of spinal cord compression on my exam.  CT scan will give [place] better idea on  morphology of the spine, at this time I do not think patient needs an emergent MRI.  If the work-up in the ER is negative then we will have her follow-up with her primary care doctor.  Final Clinical Impressions(s) / ED Diagnoses   Final diagnoses:  Constipation due to pain medication  Midline low back pain without sciatica, unspecified chronicity  Abdominal bloating    ED Discharge Orders        Ordered    polyethylene glycol (MIRALAX / GLYCOLAX) packet  Daily     06/04/18 1832       Varney Biles, MD 06/04/18 1939

## 2018-06-04 NOTE — Telephone Encounter (Signed)
Patient calling saying she is in terrible pain, offered appt for today, would never commit to this, wanted to talk to you regarding her pain  [phone]

## 2018-06-04 NOTE — Telephone Encounter (Signed)
Call placed to patient to inquire. Reports that side pain has increased and she is in so much pain that she cannot breathe deeply or move. States that she is concerned that her pain may be in her lung or ribs.   Advised that if she cannot breathe, she needs to be seen. Advised that if pain is severe, she needs to be seen. Patient did not want to schedule OV. Requested to increase use of pain medication. Discussed with MD. States that she cannot take her pain medication with the Hycodan cough syrup as this will lead to respiratory depression. States that she has flexeril and Miralax.   Advised that she can alternate APAP with IBU q 6 hours. Requested if she can use pain relieving patch like Icy Hot. Advised patient to try it.  Patient reports that she has not had BP since leaving office. States that she has been taking Miralax without relief. Advised to increase fluid intake to help BM. States that she has only been drinking approximately 4 glasses of fluids per day. Advised that MIralax pulls additional fluid to bowels to move stool. Advised that fluid pills will cause her to remove excess fluids as well. Advised that she can use Prune Juice or OTC suppositories. States that she did drink half a bottle of magnesium citrate last night. Reports small hard ball of stool moved.

## 2018-06-04 NOTE — Discharge Instructions (Addendum)
The work-up in the ER is normal.  CT scan does not show any internal injuries, infection, obstruction. The spine x-ray does not show any new fractures.  We are sending you home with medications for constipation. It is prudent that you follow-up with your primary care doctor who can help you further with this pain management and send you to a specialist or get further testing if needed.

## 2018-06-04 NOTE — Telephone Encounter (Signed)
Agree with above, I just discussed everything with her and what meds she can and cant take  She can go to ER which Nurse and myself discussed with her

## 2018-06-04 NOTE — ED Triage Notes (Signed)
Patient complaining of back pain radiating into abdomen and down bilateral sides x 1 week. States she was treated here recently for same.

## 2018-06-04 NOTE — Telephone Encounter (Signed)
Patient came in on 05/28/18 and message was discussed with patient during office visit. This is late entry

## 2018-06-07 ENCOUNTER — Telehealth: Payer: Self-pay | Admitting: Family Medicine

## 2018-06-07 NOTE — Telephone Encounter (Signed)
Call placed to patient with provider recommendations.   States that she is having severe pain and pain medications are not working.   States that she has so much loose stools that she is having multiple accidents a day. Reports that she will not take anything to help move her bowels.   Appointment scheduled for ER F/U

## 2018-06-07 NOTE — Telephone Encounter (Signed)
Stop the pepto bismol Cut the miralax back to twice a day, diarrhea should stop, this is  because she was constipated so now bowels are moving some, but she still has a lot of stool in her colon that needs to come out  Do not take any other laxatives or bowel medications, besides the miralax   I saw her hospital notes, there was nothing to keep her in the hospital for? Her xray on her back showed a newer tiny compression fracture near T11, this could be causing some pain , but should not be severe, I think that is her regular chronic back pain.  I will refill her pain if she is OUT, meds but do not take more than what is on the bottle, do NOT take with the cough medicine    Come into the office, instead of the ER

## 2018-06-07 NOTE — Telephone Encounter (Signed)
Pt called still complaining of pain in her back, she has used all the medication for constipation and now has explosive diarrhea, cannot even get up and move without going on herself. The hospital will not keep her. She has started using pepto to stop the diarrhea because she cannot control her bowels. Please call back if you have any further ?'s.

## 2018-06-09 ENCOUNTER — Other Ambulatory Visit: Payer: Self-pay

## 2018-06-09 ENCOUNTER — Encounter: Payer: Self-pay | Admitting: Family Medicine

## 2018-06-09 ENCOUNTER — Ambulatory Visit (INDEPENDENT_AMBULATORY_CARE_PROVIDER_SITE_OTHER): Payer: Medicare Other | Admitting: Family Medicine

## 2018-06-09 VITALS — BP 148/82 | HR 64 | Temp 98.8°F | Resp 26 | Wt 202.2 lb

## 2018-06-09 DIAGNOSIS — M7989 Other specified soft tissue disorders: Secondary | ICD-10-CM | POA: Diagnosis not present

## 2018-06-09 DIAGNOSIS — K5904 Chronic idiopathic constipation: Secondary | ICD-10-CM | POA: Diagnosis not present

## 2018-06-09 DIAGNOSIS — J9611 Chronic respiratory failure with hypoxia: Secondary | ICD-10-CM

## 2018-06-09 DIAGNOSIS — K219 Gastro-esophageal reflux disease without esophagitis: Secondary | ICD-10-CM

## 2018-06-09 DIAGNOSIS — S22000D Wedge compression fracture of unspecified thoracic vertebra, subsequent encounter for fracture with routine healing: Secondary | ICD-10-CM | POA: Diagnosis not present

## 2018-06-09 MED ORDER — HYDROMORPHONE HCL 4 MG PO TABS: 4.0000 mg | ORAL_TABLET | Freq: Two times a day (BID) | ORAL | 0 refills | Status: DC | PRN

## 2018-06-09 MED ORDER — PANTOPRAZOLE SODIUM 40 MG PO TBEC: 40.0000 mg | DELAYED_RELEASE_TABLET | Freq: Two times a day (BID) | ORAL | 2 refills | Status: DC

## 2018-06-09 NOTE — Progress Notes (Signed)
Subjective:    Patient ID: Cindy [name], female    DOB: [birth year], 62 y.o.   MRN: [id number]  Patient presents for ER F/U Please see previous notations from her multiple visits between our office as well as the emergency room.  She continues to complain of abdominal pain as well as back pain.  She was seen in the ER again a few days ago her CT scan did show a mild subtle T11 compression fracture which was new.  She has old fractures in the lumbar spine but nothing new.  She initially points to her lumbar region of her pain then points up higher.  States that it hurts to take a breath in her back.  It appears that she was given Dilaudid back in May when she was at an ER visit but did not take very much.  She decided to take this last night and that helped her pain and helps her sleep.  When she does take the Percocet she states it does not work but then states that she just pinches off pieces she showed me a tablet were about a fourth of it was missing.  Regarding her constipation advised her that she still has moderate stool burden on her CT scan she had diarrhea and because of her back pain she did not want to take any more laxative medication.  Same thing as far as her peripheral edema she did not want to take the Lasix because she had to get up to go to the bathroom is made her back and stomach hurt.  She was here today with her family friend whom she is staying with Cindy [name] he is helping to take care of her.   note for her COPD she still has a few antibiotics left.  There was no pneumonia on the imaging.   No blood in stool Review Of Systems:  GEN- denies fatigue, fever, weight loss,weakness, recent illness HEENT- denies eye drainage, change in vision, nasal discharge, CVS- denies chest pain, palpitations RESP- denies SOB, cough, wheeze ABD- denies N/V, +change in stools, +abd pain GU- denies dysuria, hematuria, dribbling, incontinence MSK- +joint pain, muscle aches, injury Neuro- denies  headache, dizziness, syncope, seizure activity       Objective:    BP (!) 148/82   Pulse 64   Temp 98.8 F (37.1 C) (Oral)   Resp (!) 26   Wt 202 lb 3.2 oz (91.7 kg)   SpO2 94%   BMI 38.21 kg/m  GEN- NAD, alert and oriented x3,obese, chronically ill appearing ,sitting in wheelchair HEENT- PERRL, EOMI, non injected sclera, pink conjunctiva, MMM, oropharynx thrush wearing 3L  Neck- Supple,  CVS- RRRt, no murmur RESP-few scattered wheeze , SOB with little exertion, normal sat on 3L, ABD-NABS,soft,NT, large pannus EXT- pitting  Edema right above ankle and feet bilat Pulses- Radial, DP- 2+       Assessment & Plan:      Problem List Items Addressed This Visit      Unprioritized   Chronic hypoxemic respiratory failure (HCC)   Constipation   GERD (gastroesophageal reflux disease)   Relevant Medications   pantoprazole (PROTONIX) 40 MG tablet   Leg swelling    We cannot improve her bowel situation or her peripheral edema she does not take the medications.  Discussed the importance of this.  For her back pain she does have a new compression fracture which could be contributing to some back pain along with her chronic pain and ailments.  She will stop the Percocet she will use the Dilaudid twice a day for pain for now.  I will go ahead and refill this for Saturday.  She currently has 12 tablets.  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  She wants to hold off at this time.  Advised her to take MiraLAX twice a day as she will be on a stronger narcotic medication.  Hopefully we can keep her out of the emergency room.  It is difficult to tell how she is improving since she is not been compliant with what has been prescribed.       Other Visit Diagnoses    Closed compression fracture of  thoracic vertebra with routine healing, subsequent encounter    -  Primary      Note: This dictation was prepared with Dragon dictation along with smaller phrase technology. Any transcriptional errors that result from this process are unintentional.

## 2018-06-09 NOTE — Assessment & Plan Note (Signed)
We cannot improve her bowel situation or her peripheral edema she does not take the medications.  Discussed the importance of this.  For her back pain she does have a new compression fracture which could be contributing to some back pain along with her chronic pain and ailments.  She will stop the Percocet she will use the Dilaudid twice a day for pain for now.  I will go ahead and refill this for Saturday.  She currently has 12 tablets.  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  She wants to hold off at this time.  Advised her to take MiraLAX twice a day as she will be on a stronger narcotic medication.  Hopefully we can keep her out of the emergency room.  It is difficult to tell how she is improving since she is not been compliant with what has been prescribed.

## 2018-06-09 NOTE — Patient Instructions (Addendum)
Miralax twice a day  Dilaudid twice a day  Finish antibiotics Take the water pill  Elevate your legs  F/U cancel appt for June 19th  F/U 2nd week of July

## 2018-06-16 ENCOUNTER — Ambulatory Visit: Payer: Medicare Other | Admitting: Family Medicine

## 2018-06-16 ENCOUNTER — Other Ambulatory Visit: Payer: Self-pay | Admitting: Family Medicine

## 2018-06-16 NOTE — Telephone Encounter (Signed)
Ok to refill 

## 2018-06-22 ENCOUNTER — Ambulatory Visit (INDEPENDENT_AMBULATORY_CARE_PROVIDER_SITE_OTHER): Payer: Medicare Other | Admitting: Emergency Medicine

## 2018-06-22 ENCOUNTER — Encounter: Payer: Self-pay | Admitting: Emergency Medicine

## 2018-06-22 DIAGNOSIS — J411 Mucopurulent chronic bronchitis: Secondary | ICD-10-CM

## 2018-06-22 DIAGNOSIS — J9611 Chronic respiratory failure with hypoxia: Secondary | ICD-10-CM

## 2018-06-22 NOTE — Patient Instructions (Signed)
Please continue your Symbicort and Spiriva as you are taking them  We will decrease prednisone back down to 10mg  daily.  Keep your albuterol nebs available to use up to every 4 hours as needed for shortness of breath.  Work hard on minimizing your smoking to the best of your ability Continue your oxygen at 3L/min at all times.  Follow with Dr Lamonte Sakai in 2 months or sooner if you have any problems.

## 2018-06-22 NOTE — Progress Notes (Signed)
  Subjective:    Patient ID: Cindy [name], female    DOB: Sep 15, 1956, 62 y.o.   MRN: [id number]  HPI  ROV 04/29/18 --62 year old smoker with a history of severe COPD, chronic cough with dyspnea.  She has chronic hypoxemic respiratory failure and is prednisone dependent.  She has frequent exacerbations that are mostly bronchitic in nature.  She was treated for an acute exacerbation 4/8 by her primary care office.  She returns today reporting that she is having LE swelling, B calf pain, increased dyspnea and chest tightness. She is wearing O2 at 3L/min. Uses Spiriva, Symbicort, pred 10mg  daily. We tried to start daliresp but it was not covered by her insurance. She is having breakthrough GERD, especially at night, on protonix.  She continues to smoke 1 pack a day  ROV 06/22/18 --62 year old active smoker with severe COPD, chronic cough with mucopurulent bronchitis and frequent exacerbations.  She also has chronic hypoxemic respiratory failure for which she uses oxygen at 3 L/min. She has been dealing with compression fx in her back, back pain. She is on pred 20mg  daily since our last visit 04/29/18.  She is on symbicort and spiriva, uses albuterol nebs 2-3x a day. Still smoking about 5-20 cig a day depending on how her breathing feels. She is having more edema in her LE and her face    PULMONARY FUNCTON TEST 09/27/2012  FVC 2.31  FEV1 .87  FEV1/FVC 37.7  FVC  % Predicted 76  FEV % Predicted 38  FeF 25-75 .3  FeF 25-75 % Predicted 2.65       Objective:   Physical Exam Vitals:   06/22/18 1018  BP: 132/78  Pulse: (!) 118  SpO2: 98%  Weight: 198 lb (89.8 kg)   Gen: Pleasant, Obese woman, in no distress,   ENT: No lesions,  mouth clear,  oropharynx clear, no postnasal drip, strong voice, some increasd facial edema  Neck: No JVD, noStridor  Lungs: No use of accessory muscles, low pitched B exp wheezes  Cardiovascular: RRR, heart sounds normal, no murmur or gallops, 1+ lower ext peripheral  edema increased on the R  Musculoskeletal: No deformities, no cyanosis or clubbing  Neuro: alert, non focal  Skin: Warm, no lesions or rashes     Assessment & Plan:  COPD (chronic obstructive pulmonary disease) Very difficult to manage, as long she keeps smoking she will frequently exacerbate.  She had one exacerbation since our last visit that prompted an ER trip.  I did increase her prednisone but she has increased facial edema increased lower extremity edema, weight gain and unfortunately also evidence of a new compression fracture.  I think I have to go back down to prednisone 10 mg daily.  Continue her other bronchodilators as ordered.  We discussed smoking cessation and she is going to try to decrease as best she can.  Chronic hypoxemic respiratory failure ([place]) Continue 3L/min at all times  Baltazar Apo, MD, PhD 06/22/2018, 10:43 AM [facility] [phone] or if no answer [phone]

## 2018-06-22 NOTE — Assessment & Plan Note (Signed)
Very difficult to manage, as long she keeps smoking she will frequently exacerbate.  She had one exacerbation since our last visit that prompted an ER trip.  I did increase her prednisone but she has increased facial edema increased lower extremity edema, weight gain and unfortunately also evidence of a new compression fracture.  I think I have to go back down to prednisone 10 mg daily.  Continue her other bronchodilators as ordered.  We discussed smoking cessation and she is going to try to decrease as best she can.

## 2018-06-22 NOTE — Assessment & Plan Note (Signed)
Continue 3 L/min at all times °

## 2018-06-25 ENCOUNTER — Telehealth: Payer: Self-pay | Admitting: Family Medicine

## 2018-06-25 NOTE — Telephone Encounter (Signed)
Pt called in stating that she is having allergic reaction to hydromorphone that its made her break out in rash on her chest and that its giving her dry mouth she has been mixing it with her oxycodone. States that her lasix is also not working and that the swelling in her leg/s is the same or worse than before. Please call pt.

## 2018-06-25 NOTE — Telephone Encounter (Signed)
She can not take both. Chose 1 Take benadryl for the rash/itching The swelling is not going to go all the way down as chronic, if 40mg  is not helping then increase to 40mg  BID for 1 week, make appt next week

## 2018-06-25 NOTE — Telephone Encounter (Signed)
This encounter was created in error - please disregard.

## 2018-06-25 NOTE — Addendum Note (Signed)
Addended by: Sheral Flow on: 06/25/2018 03:26 PM   Modules accepted: Level of Service, SmartSet

## 2018-06-25 NOTE — Telephone Encounter (Signed)
Call placed to patient and patient made aware.   States that she will keep her Oxycodone. States that she does not want to have Dilaudid.   Reports that she will take extra Lasix, but states that she can't make another appointment next week. States that she will follow up the week after next.

## 2018-06-25 NOTE — Telephone Encounter (Signed)
Error

## 2018-07-02 ENCOUNTER — Telehealth: Payer: Self-pay | Admitting: Family Medicine

## 2018-07-02 NOTE — Telephone Encounter (Signed)
She needs to come in for a recheck next week.

## 2018-07-02 NOTE — Telephone Encounter (Signed)
Spoke with patient and informed her to continue to take medications as prescribed by Dr. Buelah Manis and follow up next week with her. Patient verbalized understanding

## 2018-07-02 NOTE — Telephone Encounter (Signed)
Patient called in today stating that her swelling to her legs and feet have not gotten any better. Last Friday she called in and was told by Dr. Buelah Manis to increase Lasix 40 mg BID. Patient states it has not helped her swelling at all. Patient denies SOB. Please advise?

## 2018-07-07 ENCOUNTER — Other Ambulatory Visit: Payer: Self-pay | Admitting: Family Medicine

## 2018-07-09 ENCOUNTER — Other Ambulatory Visit: Payer: Self-pay

## 2018-07-09 ENCOUNTER — Ambulatory Visit (INDEPENDENT_AMBULATORY_CARE_PROVIDER_SITE_OTHER): Payer: Medicare Other | Admitting: Family Medicine

## 2018-07-09 ENCOUNTER — Encounter: Payer: Self-pay | Admitting: Family Medicine

## 2018-07-09 VITALS — BP 138/68 | HR 100 | Temp 97.7°F | Resp 14 | Ht 62.0 in | Wt 194.0 lb

## 2018-07-09 DIAGNOSIS — J9611 Chronic respiratory failure with hypoxia: Secondary | ICD-10-CM

## 2018-07-09 DIAGNOSIS — J411 Mucopurulent chronic bronchitis: Secondary | ICD-10-CM | POA: Diagnosis not present

## 2018-07-09 DIAGNOSIS — I5189 Other ill-defined heart diseases: Secondary | ICD-10-CM

## 2018-07-09 DIAGNOSIS — M7989 Other specified soft tissue disorders: Secondary | ICD-10-CM | POA: Diagnosis not present

## 2018-07-09 DIAGNOSIS — R0609 Other forms of dyspnea: Secondary | ICD-10-CM

## 2018-07-09 DIAGNOSIS — R0600 Dyspnea, unspecified: Secondary | ICD-10-CM

## 2018-07-09 LAB — BASIC METABOLIC PANEL
BUN: 7 mg/dL (ref 7–25)
CO2: 37 mmol/L — ABNORMAL HIGH (ref 20–32)
Calcium: 9.4 mg/dL (ref 8.6–10.4)
Chloride: 87 mmol/L — ABNORMAL LOW (ref 98–110)
Creat: 0.83 mg/dL (ref 0.50–0.99)
Glucose, Bld: 163 mg/dL — ABNORMAL HIGH (ref 65–99)
POTASSIUM: 4 mmol/L (ref 3.5–5.3)
Sodium: 138 mmol/L (ref 135–146)

## 2018-07-09 NOTE — Patient Instructions (Addendum)
Try the compression wrap We will call [employer] about your pain medication Lasix alternate 40mg  and 80mg  once a day  We will call with lab results Ultrasound to be done of your heart  F/U 3 MONTHS Dr. Dennard Schaumann

## 2018-07-09 NOTE — Progress Notes (Signed)
   Subjective:    Patient ID: Cindy [name], female    DOB: 28-Dec-1956, 62 y.o.   MRN: [id number]  Patient presents for Follow-up (is not fasting)   Pt here to f/u   Chronic pain/ compression fractiure- on oxycodone, states dilaudid began to make her mouth dry and she was dizzy, so she switched back to oxycodone, has bottle with her. She is concerned her pill is a different color   COPD- finished all but 1 day of amoxicillin, using 3L, back down to 10mg  of prednisone    Peripheral Edema- states compression hose are too small she brought these OTC. Taking Lasix, left leg is down and now staying on right leg     COnstipation using miralax and metamucil bowels are better   Review Of Systems:  GEN- denies fatigue, fever, weight loss,weakness, recent illness HEENT- denies eye drainage, change in vision, nasal discharge, CVS- denies chest pain, palpitations RESP- + SOB, +cough, +wheeze ABD- denies N/V, change in stools, abd pain GU- denies dysuria, hematuria, dribbling, incontinence MSK- + joint pain, muscle aches, injury Neuro- denies headache, dizziness, syncope, seizure activity       Objective:    BP 138/68   Pulse 100   Temp 97.7 F (36.5 C) (Oral)   Resp 14   Ht 5\' 2"  (1.575 m)   Wt 194 lb (88 kg)   SpO2 90%   BMI 35.48 kg/m  GEN- NAD, alert and oriented x3 using walking today HEENT- PERRL, EOMI, non injected sclera, pink conjunctiva, MMM, oropharynx clear, tongue appears a little dry Neck- Supple, no JVD CVS- mild tachycardia at rest, no murmur RESP-scattered wheeze, baseline WOB at rest, 3L ABD-NABS,soft,NT,ND EXT- non pitting edema to mid shin RLE Pulses- Radial 2+        Assessment & Plan:      Problem List Items Addressed This Visit      Unprioritized   Chronic hypoxemic respiratory failure ([place]) - Primary   Relevant Orders   Basic metabolic panel (Completed)   Brain natriuretic peptide   ECHOCARDIOGRAM COMPLETE   COPD (chronic obstructive pulmonary  disease) (HCC)    Severe end stage COPD, see previous notes/pulmonary notes Completed recent antibiotics and prednisone burst On 3l oxygen No changes to regimen Reviewed pulmonary recent note      Leg swelling    Weight down 8lbs in past few weeks using lasix 80mg , but she feels it is too much for her Will alternate 40mg  and 80mg  Unable to go to drug store for compression fitting, states she just cant Instead we ace wrapped her right leg, she can remove at bedtime Will also check BNP and 2d Echo, I think with her edema, COPD, she has some component of right sided heart failure/diastolic heart failure as well. Chronic steroids/poor mobility and the weight gain also contribute to her edema      Relevant Orders   Basic metabolic panel (Completed)   Brain natriuretic peptide   ECHOCARDIOGRAM COMPLETE    Other Visit Diagnoses    DOE (dyspnea on exertion)       Relevant Orders   Basic metabolic panel (Completed)   Brain natriuretic peptide   ECHOCARDIOGRAM COMPLETE   Diastolic dysfunction       Relevant Orders   ECHOCARDIOGRAM COMPLETE      Note: This dictation was prepared with Dragon dictation along with smaller phrase technology. Any transcriptional errors that result from this process are unintentional.

## 2018-07-10 ENCOUNTER — Encounter: Payer: Self-pay | Admitting: Family Medicine

## 2018-07-10 LAB — BRAIN NATRIURETIC PEPTIDE: Brain Natriuretic Peptide: 11 pg/mL (ref <100)

## 2018-07-10 NOTE — Assessment & Plan Note (Signed)
Severe end stage COPD, see previous notes/pulmonary notes Completed recent antibiotics and prednisone burst On 3l oxygen No changes to regimen Reviewed pulmonary recent note

## 2018-07-10 NOTE — Assessment & Plan Note (Signed)
Weight down 8lbs in past few weeks using lasix 80mg , but she feels it is too much for her Will alternate 40mg  and 80mg  Unable to go to drug store for compression fitting, states she just cant Instead we ace wrapped her right leg, she can remove at bedtime Will also check BNP and 2d Echo, I think with her edema, COPD, she has some component of right sided heart failure/diastolic heart failure as well. Chronic steroids/poor mobility and the weight gain also contribute to her edema

## 2018-07-20 ENCOUNTER — Other Ambulatory Visit: Payer: Self-pay | Admitting: Family Medicine

## 2018-07-30 ENCOUNTER — Telehealth: Payer: Self-pay | Admitting: Family Medicine

## 2018-07-30 NOTE — Telephone Encounter (Signed)
Call placed to patient to inquire.   Reports that she is alternating Lasix 40mg  and 80mg . Reports that she continues to have edema to RLE. States that edema does not improve with elevation or overnight. States that she is constantly going to the BR, and she is drinking fluids to make sure her kidneys are not dehydrated. Reports that she is elevating her leg during the day as well.   Requested MD to advise if there is anything else she can be doing.

## 2018-07-30 NOTE — Telephone Encounter (Signed)
She was suppose to be using the compression wraps, since she did not want to get the hose from the store [employer] [facility]. Would keep taking diuretics regulary otherwise She can always come back for a recheck,

## 2018-07-30 NOTE — Telephone Encounter (Signed)
Call placed to patient and patient made aware.  

## 2018-07-30 NOTE — Telephone Encounter (Signed)
Pt called in concerned about how much lasix she is taking along with all the water she is drinking, please call patient, I told her she probably needs to come in for appointment, but she wants to talk with you.

## 2018-08-10 ENCOUNTER — Inpatient Hospital Stay (HOSPITAL_COMMUNITY)
Admission: EM | Admit: 2018-08-10 | Discharge: 2018-08-21 | DRG: 246 | Disposition: A | Discharge status: Home Health | Payer: Medicare Other | Attending: Internal Medicine | Admitting: Internal Medicine

## 2018-08-10 ENCOUNTER — Other Ambulatory Visit: Payer: Self-pay

## 2018-08-10 ENCOUNTER — Emergency Department (HOSPITAL_COMMUNITY): Payer: Medicare Other

## 2018-08-10 ENCOUNTER — Encounter (HOSPITAL_COMMUNITY): Payer: Self-pay | Admitting: *Deleted

## 2018-08-10 DIAGNOSIS — J9620 Acute and chronic respiratory failure, unspecified whether with hypoxia or hypercapnia: Secondary | ICD-10-CM | POA: Diagnosis present

## 2018-08-10 DIAGNOSIS — J9602 Acute respiratory failure with hypercapnia: Secondary | ICD-10-CM | POA: Diagnosis present

## 2018-08-10 DIAGNOSIS — I97638 Postprocedural hematoma of a circulatory system organ or structure following other circulatory system procedure: Secondary | ICD-10-CM | POA: Diagnosis not present

## 2018-08-10 DIAGNOSIS — Z79899 Other long term (current) drug therapy: Secondary | ICD-10-CM

## 2018-08-10 DIAGNOSIS — R9431 Abnormal electrocardiogram [ECG] [EKG]: Secondary | ICD-10-CM | POA: Diagnosis present

## 2018-08-10 DIAGNOSIS — I110 Hypertensive heart disease with heart failure: Secondary | ICD-10-CM | POA: Diagnosis present

## 2018-08-10 DIAGNOSIS — Z7951 Long term (current) use of inhaled steroids: Secondary | ICD-10-CM

## 2018-08-10 DIAGNOSIS — E782 Mixed hyperlipidemia: Secondary | ICD-10-CM | POA: Diagnosis present

## 2018-08-10 DIAGNOSIS — I5043 Acute on chronic combined systolic (congestive) and diastolic (congestive) heart failure: Secondary | ICD-10-CM | POA: Diagnosis present

## 2018-08-10 DIAGNOSIS — M7989 Other specified soft tissue disorders: Secondary | ICD-10-CM

## 2018-08-10 DIAGNOSIS — I214 Non-ST elevation (NSTEMI) myocardial infarction: Principal | ICD-10-CM | POA: Diagnosis present

## 2018-08-10 DIAGNOSIS — Z888 Allergy status to other drugs, medicaments and biological substances status: Secondary | ICD-10-CM

## 2018-08-10 DIAGNOSIS — I272 Pulmonary hypertension, unspecified: Secondary | ICD-10-CM | POA: Diagnosis present

## 2018-08-10 DIAGNOSIS — R739 Hyperglycemia, unspecified: Secondary | ICD-10-CM | POA: Diagnosis present

## 2018-08-10 DIAGNOSIS — F419 Anxiety disorder, unspecified: Secondary | ICD-10-CM | POA: Diagnosis present

## 2018-08-10 DIAGNOSIS — Z7952 Long term (current) use of systemic steroids: Secondary | ICD-10-CM

## 2018-08-10 DIAGNOSIS — Z9861 Coronary angioplasty status: Secondary | ICD-10-CM

## 2018-08-10 DIAGNOSIS — M4854XA Collapsed vertebra, not elsewhere classified, thoracic region, initial encounter for fracture: Secondary | ICD-10-CM | POA: Diagnosis present

## 2018-08-10 DIAGNOSIS — R000 Tachycardia, unspecified: Secondary | ICD-10-CM | POA: Diagnosis present

## 2018-08-10 DIAGNOSIS — F17210 Nicotine dependence, cigarettes, uncomplicated: Secondary | ICD-10-CM | POA: Diagnosis present

## 2018-08-10 DIAGNOSIS — R0601 Orthopnea: Secondary | ICD-10-CM

## 2018-08-10 DIAGNOSIS — J441 Chronic obstructive pulmonary disease with (acute) exacerbation: Secondary | ICD-10-CM | POA: Diagnosis present

## 2018-08-10 DIAGNOSIS — D72829 Elevated white blood cell count, unspecified: Secondary | ICD-10-CM

## 2018-08-10 DIAGNOSIS — T380X5A Adverse effect of glucocorticoids and synthetic analogues, initial encounter: Secondary | ICD-10-CM | POA: Diagnosis present

## 2018-08-10 DIAGNOSIS — Z886 Allergy status to analgesic agent status: Secondary | ICD-10-CM

## 2018-08-10 DIAGNOSIS — E876 Hypokalemia: Secondary | ICD-10-CM | POA: Diagnosis present

## 2018-08-10 DIAGNOSIS — R0689 Other abnormalities of breathing: Secondary | ICD-10-CM | POA: Diagnosis present

## 2018-08-10 DIAGNOSIS — T50905A Adverse effect of unspecified drugs, medicaments and biological substances, initial encounter: Secondary | ICD-10-CM | POA: Diagnosis present

## 2018-08-10 DIAGNOSIS — I5189 Other ill-defined heart diseases: Secondary | ICD-10-CM

## 2018-08-10 DIAGNOSIS — Z7982 Long term (current) use of aspirin: Secondary | ICD-10-CM

## 2018-08-10 DIAGNOSIS — I252 Old myocardial infarction: Secondary | ICD-10-CM

## 2018-08-10 DIAGNOSIS — I2510 Atherosclerotic heart disease of native coronary artery without angina pectoris: Secondary | ICD-10-CM | POA: Diagnosis present

## 2018-08-10 DIAGNOSIS — I10 Essential (primary) hypertension: Secondary | ICD-10-CM

## 2018-08-10 DIAGNOSIS — Z882 Allergy status to sulfonamides status: Secondary | ICD-10-CM

## 2018-08-10 DIAGNOSIS — E875 Hyperkalemia: Secondary | ICD-10-CM | POA: Diagnosis present

## 2018-08-10 DIAGNOSIS — Z9981 Dependence on supplemental oxygen: Secondary | ICD-10-CM

## 2018-08-10 DIAGNOSIS — I5032 Chronic diastolic (congestive) heart failure: Secondary | ICD-10-CM

## 2018-08-10 DIAGNOSIS — Z720 Tobacco use: Secondary | ICD-10-CM | POA: Diagnosis present

## 2018-08-10 DIAGNOSIS — Z883 Allergy status to other anti-infective agents status: Secondary | ICD-10-CM

## 2018-08-10 DIAGNOSIS — E1165 Type 2 diabetes mellitus with hyperglycemia: Secondary | ICD-10-CM | POA: Diagnosis present

## 2018-08-10 DIAGNOSIS — G8929 Other chronic pain: Secondary | ICD-10-CM | POA: Diagnosis present

## 2018-08-10 DIAGNOSIS — R0602 Shortness of breath: Secondary | ICD-10-CM | POA: Diagnosis not present

## 2018-08-10 DIAGNOSIS — K219 Gastro-esophageal reflux disease without esophagitis: Secondary | ICD-10-CM | POA: Diagnosis present

## 2018-08-10 DIAGNOSIS — R0603 Acute respiratory distress: Secondary | ICD-10-CM

## 2018-08-10 DIAGNOSIS — E785 Hyperlipidemia, unspecified: Secondary | ICD-10-CM | POA: Diagnosis present

## 2018-08-10 DIAGNOSIS — R600 Localized edema: Secondary | ICD-10-CM

## 2018-08-10 LAB — CBC WITH DIFFERENTIAL/PLATELET
Basophils Absolute: 0 10*3/uL (ref 0.0–0.1)
Basophils Relative: 0 %
EOS PCT: 0 %
Eosinophils Absolute: 0 10*3/uL (ref 0.0–0.7)
HCT: 42.8 % (ref 36.0–46.0)
HEMOGLOBIN: 13.1 g/dL (ref 12.0–15.0)
LYMPHS ABS: 2.4 10*3/uL (ref 0.7–4.0)
LYMPHS PCT: 15 %
MCH: 30.8 pg (ref 26.0–34.0)
MCHC: 30.6 g/dL (ref 30.0–36.0)
MCV: 100.5 fL — AB (ref 78.0–100.0)
Monocytes Absolute: 1.1 10*3/uL (ref 0.1–1.0)
Monocytes Relative: 7 %
Neutro Abs: 12.9 10*3/uL (ref 1.7–7.7)
Neutrophils Relative %: 78 %
PLATELETS: 269 10*3/uL (ref 150–400)
RBC: 4.26 MIL/uL (ref 3.87–5.11)
RDW: 14.3 % (ref 11.5–15.5)
WBC: 16.4 10*3/uL — AB (ref 4.0–10.5)

## 2018-08-10 LAB — BRAIN NATRIURETIC PEPTIDE: B Natriuretic Peptide: 32 pg/mL (ref 0.0–100.0)

## 2018-08-10 LAB — BASIC METABOLIC PANEL
Anion gap: 8 (ref 5–15)
BUN: 7 mg/dL — AB (ref 8–23)
CHLORIDE: 92 mmol/L — AB (ref 98–111)
CO2: 42 mmol/L — ABNORMAL HIGH (ref 22–32)
Calcium: 9.1 mg/dL (ref 8.9–10.3)
Creatinine, Ser: 0.81 mg/dL (ref 0.44–1.00)
GFR calc Af Amer: >60 mL/min (ref >60)
GFR calc non Af Amer: >60 mL/min (ref >60)
Glucose, Bld: 179 mg/dL — ABNORMAL HIGH (ref 70–99)
POTASSIUM: 3.4 mmol/L — AB (ref 3.5–5.1)
SODIUM: 142 mmol/L (ref 135–145)

## 2018-08-10 LAB — PHOSPHORUS: PHOSPHORUS: 2.9 mg/dL (ref 2.5–4.6)

## 2018-08-10 LAB — I-STAT TROPONIN, ED: Troponin i, poc: 0.01 ng/mL (ref 0.00–0.08)

## 2018-08-10 LAB — MAGNESIUM: MAGNESIUM: 2 mg/dL (ref 1.7–2.4)

## 2018-08-10 MED ORDER — SIMVASTATIN 20 MG PO TABS
40.0000 mg | ORAL_TABLET | Freq: Every day | ORAL | Status: DC
  Administered 2018-08-11 (×2): 40 mg via ORAL
  Filled 2018-08-10 (×2): qty 2

## 2018-08-10 MED ORDER — ALBUTEROL SULFATE (2.5 MG/3ML) 0.083% IN NEBU
2.5000 mg | INHALATION_SOLUTION | RESPIRATORY_TRACT | Status: DC | PRN
  Administered 2018-08-11 – 2018-08-21 (×9): 2.5 mg via RESPIRATORY_TRACT
  Filled 2018-08-10 (×11): qty 3

## 2018-08-10 MED ORDER — MAGNESIUM SULFATE 2 GM/50ML IV SOLN
2.0000 g | Freq: Once | INTRAVENOUS | Status: AC
  Administered 2018-08-11: 2 g via INTRAVENOUS
  Filled 2018-08-10: qty 50

## 2018-08-10 MED ORDER — ASPIRIN EC 81 MG PO TBEC
81.0000 mg | DELAYED_RELEASE_TABLET | Freq: Every morning | ORAL | Status: DC
  Administered 2018-08-11 – 2018-08-18 (×7): 81 mg via ORAL
  Filled 2018-08-10 (×8): qty 1

## 2018-08-10 MED ORDER — METHYLPREDNISOLONE SODIUM SUCC 125 MG IJ SOLR
60.0000 mg | Freq: Four times a day (QID) | INTRAMUSCULAR | Status: AC
  Administered 2018-08-11 (×4): 60 mg via INTRAVENOUS
  Filled 2018-08-10 (×4): qty 2

## 2018-08-10 MED ORDER — PREDNISONE 50 MG PO TABS: 50.0000 mg | ORAL_TABLET | Freq: Every day | ORAL | 0 refills | Status: DC

## 2018-08-10 MED ORDER — POTASSIUM CHLORIDE CRYS ER 20 MEQ PO TBCR
40.0000 meq | EXTENDED_RELEASE_TABLET | Freq: Once | ORAL | Status: AC
  Administered 2018-08-10: 40 meq via ORAL
  Filled 2018-08-10: qty 2

## 2018-08-10 MED ORDER — METHYLPREDNISOLONE SODIUM SUCC 125 MG IJ SOLR
125.0000 mg | Freq: Once | INTRAMUSCULAR | Status: AC
  Administered 2018-08-10: 125 mg via INTRAVENOUS
  Filled 2018-08-10: qty 2

## 2018-08-10 MED ORDER — FUROSEMIDE 80 MG PO TABS
80.0000 mg | ORAL_TABLET | ORAL | Status: DC
  Administered 2018-08-11 – 2018-08-15 (×3): 80 mg via ORAL
  Filled 2018-08-10 (×2): qty 2
  Filled 2018-08-10: qty 1

## 2018-08-10 MED ORDER — IPRATROPIUM-ALBUTEROL 0.5-2.5 (3) MG/3ML IN SOLN
3.0000 mL | Freq: Four times a day (QID) | RESPIRATORY_TRACT | Status: DC
  Administered 2018-08-10 – 2018-08-13 (×11): 3 mL via RESPIRATORY_TRACT
  Filled 2018-08-10 (×11): qty 3

## 2018-08-10 MED ORDER — METOPROLOL TARTRATE 5 MG/5ML IV SOLN
2.5000 mg | Freq: Once | INTRAVENOUS | Status: AC
  Administered 2018-08-11: 2.5 mg via INTRAVENOUS
  Filled 2018-08-10: qty 5

## 2018-08-10 MED ORDER — ALBUTEROL (5 MG/ML) CONTINUOUS INHALATION SOLN
10.0000 mg/h | INHALATION_SOLUTION | Freq: Once | RESPIRATORY_TRACT | Status: AC
  Administered 2018-08-10: 10 mg/h via RESPIRATORY_TRACT
  Filled 2018-08-10: qty 20

## 2018-08-10 MED ORDER — ONDANSETRON HCL 4 MG/2ML IJ SOLN: 4.0000 mg | Freq: Four times a day (QID) | INTRAMUSCULAR | Status: DC | PRN

## 2018-08-10 MED ORDER — IPRATROPIUM BROMIDE 0.02 % IN SOLN
0.5000 mg | Freq: Once | RESPIRATORY_TRACT | Status: AC
  Administered 2018-08-10: 0.5 mg via RESPIRATORY_TRACT
  Filled 2018-08-10: qty 2.5

## 2018-08-10 MED ORDER — POLYETHYLENE GLYCOL 3350 17 G PO PACK
17.0000 g | PACK | Freq: Every day | ORAL | Status: DC
  Administered 2018-08-20: 17 g via ORAL
  Filled 2018-08-10 (×9): qty 1

## 2018-08-10 MED ORDER — ONDANSETRON HCL 4 MG PO TABS: 4.0000 mg | ORAL_TABLET | Freq: Four times a day (QID) | ORAL | Status: DC | PRN

## 2018-08-10 MED ORDER — DIPHENHYDRAMINE HCL 12.5 MG/5ML PO ELIX
6.2500 mg | ORAL_SOLUTION | ORAL | Status: DC | PRN
  Filled 2018-08-10: qty 2.5

## 2018-08-10 MED ORDER — ENOXAPARIN SODIUM 100 MG/ML ~~LOC~~ SOLN
1.0000 mg/kg | Freq: Once | SUBCUTANEOUS | Status: AC
  Administered 2018-08-10: 90 mg via SUBCUTANEOUS
  Filled 2018-08-10: qty 1

## 2018-08-10 MED ORDER — ACETAMINOPHEN 650 MG RE SUPP: 650.0000 mg | Freq: Four times a day (QID) | RECTAL | Status: DC | PRN

## 2018-08-10 MED ORDER — PANTOPRAZOLE SODIUM 40 MG PO TBEC
40.0000 mg | DELAYED_RELEASE_TABLET | Freq: Two times a day (BID) | ORAL | Status: DC
  Administered 2018-08-11 – 2018-08-21 (×21): 40 mg via ORAL
  Filled 2018-08-10 (×20): qty 1

## 2018-08-10 MED ORDER — ACETAMINOPHEN 325 MG PO TABS
650.0000 mg | ORAL_TABLET | Freq: Four times a day (QID) | ORAL | Status: DC | PRN
  Administered 2018-08-12 – 2018-08-14 (×3): 650 mg via ORAL
  Filled 2018-08-10 (×4): qty 2

## 2018-08-10 MED ORDER — ALBUTEROL SULFATE (2.5 MG/3ML) 0.083% IN NEBU: 2.5000 mg | INHALATION_SOLUTION | Freq: Four times a day (QID) | RESPIRATORY_TRACT | Status: DC

## 2018-08-10 MED ORDER — IPRATROPIUM BROMIDE 0.02 % IN SOLN: 0.5000 mg | Freq: Four times a day (QID) | RESPIRATORY_TRACT | Status: DC

## 2018-08-10 MED ORDER — OXYCODONE-ACETAMINOPHEN 5-325 MG PO TABS
1.0000 | ORAL_TABLET | Freq: Three times a day (TID) | ORAL | Status: DC | PRN
  Administered 2018-08-11 – 2018-08-13 (×7): 1 via ORAL
  Filled 2018-08-10 (×8): qty 1

## 2018-08-10 MED ORDER — NICOTINE 21 MG/24HR TD PT24
21.0000 mg | MEDICATED_PATCH | Freq: Every day | TRANSDERMAL | Status: DC
Start: 2018-08-11 — End: 2018-08-21
  Administered 2018-08-11 – 2018-08-21 (×11): 21 mg via TRANSDERMAL
  Filled 2018-08-10 (×11): qty 1

## 2018-08-10 MED ORDER — ALBUTEROL SULFATE (2.5 MG/3ML) 0.083% IN NEBU
5.0000 mg | INHALATION_SOLUTION | Freq: Once | RESPIRATORY_TRACT | Status: AC
  Administered 2018-08-10: 5 mg via RESPIRATORY_TRACT
  Filled 2018-08-10: qty 6

## 2018-08-10 MED ORDER — CALCIUM CARBONATE-VITAMIN D 500-200 MG-UNIT PO TABS
1.0000 | ORAL_TABLET | Freq: Two times a day (BID) | ORAL | Status: DC
  Administered 2018-08-11 – 2018-08-21 (×19): 1 via ORAL
  Filled 2018-08-10 (×22): qty 1

## 2018-08-10 MED ORDER — FAMOTIDINE IN NACL 20-0.9 MG/50ML-% IV SOLN
20.0000 mg | Freq: Once | INTRAVENOUS | Status: AC
  Administered 2018-08-10: 20 mg via INTRAVENOUS
  Filled 2018-08-10: qty 50

## 2018-08-10 NOTE — H&P (Signed)
History and Physical    Cindy [name] BDZ:[id number] DOB: 03-29-1956 DOA: 08/10/2018  PCP: Alycia Rossetti, MD   Patient coming from: Home.  I have personally briefly reviewed patient's old medical records in [place]  Chief Complaint: Home.  HPI: Cindy [name] is a 62 y.o. female with medical history significant of left adrenal adenoma, osteoarthritis, history of lumbar compression fracture, hyperlipidemia, urolithiasis, drug-induced hyperglycemia, osteoporosis, hypothyroidism, COPD on home oxygen who is coming to the emergency department with complaints of progressively worse shortness of breath for the past week or so.  She denies travel history or sick contacts.  She still smokes a half a pack of cigarettes a day.  She denies fever, chills, but complains of fatigue, sore throat, productive cough of whitish sputum and wheezing she denies chest pain, palpitations, dizziness, diaphoresis, PND, orthopnea, but complains of left unilateral lower extremity edema.  She complains of occasional nausea, but denies abdominal pain, emesis, diarrhea, melena or hematochezia.  She gets occasional constipation, but states this is controlled with MiraLAX.  No dysuria, frequency or hematuria.  Denies polyuria, polydipsia, polyphagia or blurred vision.  No heat or cold intolerance.  ED Course: Initial vital signs temperature 98.6 F, pulse 112, respirations 24, blood pressure 149/85 mmHg and O2 sat 99% on nasal cannula oxygen.  She received supplemental oxygen, Lovenox 90 mg x 1 dose, 125 mg of Solu-Medrol IVP, albuterol neb treatments for a total of 20 mg and ipratropium neb treatments for a total of 1 mg.  I added magnesium sulfate 2 g IVPB, K-Dur 40 mEq p.o. x1 dose and metoprolol 2.5 mg IVP x1 dose.  Her lab work shows a sodium 142, potassium 3.4, chloride 92 and CO2 42 mmol/L.  BUN was 7, creatinine 0.81 and glucose 179 mg/dL.  Sinus tachycardia at 116 bpm with right axis deviation and right atrial  enlargement.  Troponin was normal.  BNP was normal at 32.0 pg/mL.  Her CBC showed a white count of 16.4 with 78% neutrophils, 15% lymphocytes and 7% monocytes.  Hemoglobin was 13.1 g/dL and platelets 269.  Her chest radiograph showed borderline to mild cardiomegaly, but no active disease.  Please see image and full radiology report for further detail.  Review of Systems: As per HPI otherwise 10 point review of systems negative.   Past Medical History:  Diagnosis Date  . 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 Arthritis   . Chronic respiratory failure ([place]) On home 02 2-3L  . Compression fracture of lumbar vertebra ([place])   . COPD (chronic obstructive pulmonary disease) ([place])   . Hyperglycemia, drug-induced   . Hyperlipidemia   . Kidney stones 2014   Left side, multiple  . Osteoporosis 2013  . Shortness of breath   . Thyroid disease    pt denies  . Tobacco abuse     Past Surgical History:  Procedure Laterality Date  . CERVICAL BIOPSY    . COLONOSCOPY N/A 03/31/2013   Procedure: COLONOSCOPY;  Surgeon: Rogene Houston, MD;  Location: [facility];  Service: Endoscopy;  Laterality: N/A;  730  . TUBAL LIGATION       reports that she has been smoking cigarettes. She has a 25.00 pack-year smoking history. She has never used smokeless tobacco. She reports that she does not drink alcohol or use drugs.  Allergies  Allergen Reactions  . Keflex [Cephalexin] Anaphylaxis and Rash  . Doxycycline Other (See Comments)    Gave patient oral thrush  .  Sulfa Antibiotics   . Avelox [Moxifloxacin Hcl In Nacl] Itching and Rash  . Toradol [Ketorolac Tromethamine] Swelling, Rash and Other (See Comments)    Bruising, swelling, rash at injection site    Family History  Problem Relation Age of Onset  . Heart disease Mother   . Hypertension Sister   . Hypertension Brother     Prior to Admission medications   Medication Sig Start Date End Date Taking? Authorizing Provider  albuterol (PROVENTIL  HFA;VENTOLIN HFA) 108 (90 Base) MCG/ACT inhaler Inhale 2 puffs into the lungs every 6 (six) hours as needed for wheezing or shortness of breath. 05/18/18  Yes [place], Rose Fillers, MD  albuterol (PROVENTIL) (2.5 MG/3ML) 0.083% nebulizer solution INHALE 1 VIAL VIA NEBULIZER EVERY 6 HOURS AS NEEDED. Patient taking differently: Take 2.5 mg by nebulization every 6 (six) hours as needed for wheezing or shortness of breath.  06/16/18  Yes [place], Modena Nunnery, MD  alendronate (FOSAMAX) 70 MG tablet TAKE 1 TABLET BY MOUTH EVERY WEEK ON AN EMPTY STOMACH. Patient taking differently: Take 70 mg by mouth every Wednesday. TAKE 1 TABLET BY MOUTH EVERY WEEK ON AN EMPTY STOMACH. 07/20/18  Yes [place], Modena Nunnery, MD  aspirin EC 81 MG tablet Take 81 mg by mouth every morning.    Yes [provider]  budesonide-formoterol (SYMBICORT) 160-4.5 MCG/ACT inhaler Inhale 2 puffs into the lungs 2 (two) times daily. 04/27/18  Yes [place], Modena Nunnery, MD  Calcium Carb-Cholecalciferol 600-800 MG-UNIT TABS Take 1 tablet by mouth 2 (two) times daily.    Yes [provider]  diphenhydrAMINE (BENADRYL) 12.5 MG/5ML liquid Take 6.25 mg by mouth 4 (four) times daily as needed.   Yes [provider]  furosemide (LASIX) 40 MG tablet Take 1 tablet (40 mg total) by mouth daily. Patient taking differently: Take 40-80 mg by mouth See admin instructions. Alternate taking 80mg  one day, then take 40mg  the next day, then repeat. 06/02/18  Yes [place], Modena Nunnery, MD  nystatin (MYCOSTATIN) 100000 UNIT/ML suspension Use as directed 5 mLs (500,000 Units total) in the mouth or throat 4 (four) times daily. 01/29/18  Yes [place], Modena Nunnery, MD  oxyCODONE-acetaminophen (PERCOCET/ROXICET) 5-325 MG tablet take 1 tablet by mouth every 8 hours if needed for pain Patient taking differently: Take 1 tablet by mouth 2 (two) times daily.  05/21/18  Yes [place], Modena Nunnery, MD  OXYGEN Inhale 3 L into the lungs continuous.    Yes [provider]    pantoprazole (PROTONIX) 40 MG tablet Take 1 tablet (40 mg total) by mouth 2 (two) times daily. 06/09/18  Yes [place], Modena Nunnery, MD  polyethylene glycol [facility] / GLYCOLAX) packet Take 17 g by mouth daily. 06/04/18  Yes [place], Ankit, MD  predniSONE (DELTASONE) 10 MG tablet TAKE 2 TABLETS BY MOUTH EVERY MORNING WITH FOOD Patient taking differently: Take 10 mg by mouth daily with breakfast.  04/29/18  Yes Byrum, Rose Fillers, MD  simvastatin (ZOCOR) 40 MG tablet Take 1 tablet (40 mg total) by mouth at bedtime. 04/27/18  Yes [place], Modena Nunnery, MD  Tiotropium Bromide Monohydrate (SPIRIVA RESPIMAT) 2.5 MCG/ACT AERS INHALE 2 PUFFS INTO THE LUNGS ONCE DAILY Patient taking differently: Inhale 2 puffs into the lungs at bedtime.  05/28/18  Yes Delsa Grana, PA-C  cyclobenzaprine (FLEXERIL) 10 MG tablet Take 1 tablet (10 mg total) by mouth 3 (three) times daily as needed for muscle spasms. Patient not taking: Reported on 08/10/2018 05/28/18   Delsa Grana, PA-C  polyethylene glycol (GOLYTELY)  236 g solution Drink one 8 ounce glass every hour until you have a bowel movement Patient not taking: Reported on 08/10/2018 05/27/18   Milton Ferguson, MD  predniSONE (DELTASONE) 50 MG tablet Take 1 tablet (50 mg total) by mouth daily. 08/10/18   Dorie Rank, MD    Physical Exam: Vitals:   08/10/18 1819 08/10/18 1930 08/10/18 2000 08/10/18 2130  BP:      Pulse:   (!) 112 (!) 111  Resp:  16  16  Temp:      TempSrc:      SpO2: 100%  96% 95%  Weight:      Height:        Constitutional: Anxious, but states shortness of breath a lot better. Eyes: PERRL, lids and conjunctivae normal ENMT: Mucous membranes are mildly dry. Posterior pharynx has mild erythema. Neck: normal, supple, no masses, no thyromegaly Respiratory: Decreased breath sounds with wheezing and rhonchi bilaterally, no crackles. Normal respiratory effort. No accessory muscle use.  Cardiovascular: Tachycardic at 129 bpm, no murmurs / rubs / gallops.  Unilateral  right lower extremity edema. 2+ pedal pulses. No carotid bruits.  Abdomen: Obese, soft, no tenderness, no masses palpated. No hepatosplenomegaly. Bowel sounds positive.  Musculoskeletal: Mild clubbing, no cyanosis. Good ROM, no contractures. Normal muscle tone.  Skin: Hypopigmented areas on forearms. Neurologic: Mild resting tremor from albuterol.  CN 2-12 grossly intact. Sensation intact, DTR normal. Strength 5/5 in all 4.  Psychiatric: Normal judgment and insight. Alert and oriented x 4.  Mildly anxious mood.    Labs on Admission: I have personally reviewed following labs and imaging studies  CBC: Recent Labs  Lab 08/10/18 1734  WBC 16.4*  NEUTROABS 12.9  HGB 13.1  HCT 42.8  MCV 100.5*  PLT 540   Basic Metabolic Panel: Recent Labs  Lab 08/10/18 1734  NA 142  K 3.4*  CL 92*  CO2 42*  GLUCOSE 179*  BUN 7*  CREATININE 0.81  CALCIUM 9.1  MG 2.0  PHOS 2.9   GFR: Estimated Creatinine Clearance: 73.4 mL/min (by C-G formula based on SCr of 0.81 mg/dL). Liver Function Tests: No results for input(s): AST, ALT, ALKPHOS, BILITOT, PROT, ALBUMIN in the last 168 hours. No results for input(s): LIPASE, AMYLASE in the last 168 hours. No results for input(s): AMMONIA in the last 168 hours. Coagulation Profile: No results for input(s): INR, PROTIME in the last 168 hours. Cardiac Enzymes: No results for input(s): CKTOTAL, CKMB, CKMBINDEX, TROPONINI in the last 168 hours. BNP (last 3 results) No results for input(s): PROBNP in the last 8760 hours. HbA1C: No results for input(s): HGBA1C in the last 72 hours. CBG: No results for input(s): GLUCAP in the last 168 hours. Lipid Profile: No results for input(s): CHOL, HDL, LDLCALC, TRIG, CHOLHDL, LDLDIRECT in the last 72 hours. Thyroid Function Tests: No results for input(s): TSH, T4TOTAL, FREET4, T3FREE, THYROIDAB in the last 72 hours. Anemia Panel: No results for input(s): VITAMINB12, FOLATE, FERRITIN, TIBC, IRON, RETICCTPCT in the  last 72 hours. Urine analysis:    Component Value Date/Time   COLORURINE STRAW (A) 06/04/2018 1340   APPEARANCEUR CLEAR 06/04/2018 1340   LABSPEC 1.004 (L) 06/04/2018 1340   PHURINE 9.0 (H) 06/04/2018 1340   GLUCOSEU NEGATIVE 06/04/2018 1340   HGBUR NEGATIVE 06/04/2018 1340   BILIRUBINUR NEGATIVE 06/04/2018 1340   KETONESUR NEGATIVE 06/04/2018 1340   PROTEINUR NEGATIVE 06/04/2018 1340   UROBILINOGEN 0.2 04/15/2014 1949   NITRITE NEGATIVE 06/04/2018 1340   LEUKOCYTESUR NEGATIVE 06/04/2018 1340  Radiological Exams on Admission: Dg Chest Port 1 View  Result Date: 08/10/2018 CLINICAL DATA:  Shortness of breath EXAM: PORTABLE CHEST 1 VIEW COMPARISON:  05/25/2018, CT 06/04/2018 FINDINGS: Borderline to mild cardiomegaly. No acute airspace disease or pleural effusion. No pneumothorax. IMPRESSION: No active disease.  Borderline to mild cardiomegaly. Electronically Signed   By: Donavan Foil M.D.   On: 08/10/2018 17:58   05/07/2016 echocardiogram ------------------------------------------------------------------- LV EF: 55% -   60%  ------------------------------------------------------------------- Indications:      Dyspnea 786.09.  ------------------------------------------------------------------- History:   PMH:  Lower extremity swelling.  Chronic obstructive pulmonary disease.  PMH:  GERD  Risk factors:  Current tobacco use. Dyslipidemia.  ------------------------------------------------------------------- Study Conclusions  - Left ventricle: The cavity size was normal. Systolic function was   normal. The estimated ejection fraction was in the range of 55%   to 60%. Doppler parameters are consistent with abnormal left   ventricular relaxation (grade 1 diastolic dysfunction). - Aortic valve: Mildly calcified annulus. Mildly thickened   leaflets. Valve area (VTI): 1.93 cm^2. Valve area (Vmax): 1.78   cm^2. - Mitral valve: Mildly calcified annulus. Mildly thickened  leaflets   . - Technically difficult study.  EKG: Independently reviewed.  Vent. rate 116 BPM PR interval * ms QRS duration 91 ms QT/QTc 349/485 ms P-R-T axes 87 114 83 Sinus tachycardia Right atrial enlargement Right axis deviation   Assessment/Plan Principal Problem:   COPD with acute exacerbation (HCC) Observation/telemetry. Continue supplemental oxygen. Continue scheduled and as needed bronchodilators. Continue Solu-Medrol for at least another 24 hours. Smoking cessation suggested multiple times to the patient. She has an appointment later this month with pulmonology.  Active Problems:   Edema of left lower extremity Received 90 mg of Lovenox in the emergency department. Doppler ultrasound to be done in a.m.    Tobacco abuse I had an lengthy discussion with the patient on arrival her ongoing smoking. Initially, the patient was upset about having to discuss the topic.  However, after 10 minutes of discussion I made it clear that smoking cessation is the most important decision she can make to benefit her health.  Nicotine replacement therapy ordered. Staff to provide tobacco cessation information.    Hyperglycemia, drug-induced Carbohydrate modified diet. CBG monitoring with regular insulin sliding scale while on steroids. Check hemoglobin A1c.    Abnormal EKG Suspect pulmonary hypertension and/or worsening diastolic dysfunction. The patient was supposed to have an echocardiogram last month. Check echocardiogram in a.m.    Diastolic dysfunction Only on furosemide. May benefit from ARB/ACE inhibitor and/or low-dose cardioselective beta-blocker. Echo ordered.    Sinus tachycardia Secondary to dyspnea and albuterol use. Low-dose metoprolol 2.5 mg IVP ordered. Electrolytes have been corrected or supplemented. Check echocardiogram in a.m.    Hyperlipidemia Continue simvastatin 40 mg p.o. daily. Monitor LFTs as needed. Fasting lipid profile follow-up as an  outpatient.    GERD (gastroesophageal reflux disease) Pepcid 20 mg IVP given after the patient complained of upset stomach in the ED. Continue Protonix 40 mg p.o. twice daily.    Hypokalemia Replace orally. Magnesium was supplemented.    Hypercarbia Check venous pH now. Correlate with CO2 on chemistry. This may be exacerbated by daily use of furosemide. Consider adding Diamox depending on results. The patient was strongly advised to go to see pulmonology later this month.    DVT prophylaxis: Lovenox 90 mg SQ x1 dose.  Further dosing after LLE [place] results back. Code Status: Full code. Family Communication:  Disposition Plan: Observation for COPD  exacerbation treatment. Consults called:  Admission status: Observation/telemetry.   Reubin Milan MD [facility] Pager [phone].  If 7PM-7AM, please contact night-coverage [email or web address] Password [facility]  08/10/2018, 9:55 PM

## 2018-08-10 NOTE — ED Triage Notes (Signed)
Pt c/o SOB that started yesterday along with swelling to her right leg. Pt reports she gets very SOB with exertion.

## 2018-08-10 NOTE — ED Provider Notes (Addendum)
[facility] EMERGENCY DEPARTMENT Provider Note   CSN: [id number] Arrival date & time: 08/10/18  1642     History   Chief Complaint Chief Complaint  Patient presents with  . Shortness of Breath    HPI Cindy [name] is a 62 y.o. female.  HPI Patient presents to the emergency room for evaluation of shortness of breath.  Patient states she has a history of COPD.  She continues to smoke but is trying to decrease.  She is on oxygen chronically at home.  Patient states her shortness of breath has gotten worse over the last few days.  Any minimal activity makes her heart rate race and she becomes very short of breath.  She has been coughing but denies any chest pain.  She denies any fevers.  She states she rarely ever has fevers even when she is very sick.  Patient was also noticed right leg swelling the last month.  Said trouble swelling on the left leg before but the right side is new.  Does not have any history of PE or DVT.  Dr. has started on diuretics but it does not seem to be helping. Past Medical History:  Diagnosis Date  . 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 Arthritis   . Chronic respiratory failure ([place]) On home 02 2-3L  . Compression fracture of lumbar vertebra ([place])   . COPD (chronic obstructive pulmonary disease) ([place])   . Hyperglycemia, drug-induced   . Hyperlipidemia   . Kidney stones 2014   Left side, multiple  . Osteoporosis 2013  . Shortness of breath   . Thyroid disease    pt denies  . Tobacco abuse     Patient Active Problem List   Diagnosis Date Noted  . 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 Leg swelling 04/15/2016  . Rash and nonspecific skin eruption 08/15/2014  . Ureteral stone with hydronephrosis 05/30/2013  . Allergic rhinitis 04/11/2013  . Ecchymosis 04/11/2013  . Kidney stones 04/11/2013  . Lumbar back pain 03/03/2013  . Compression fracture of L3 lumbar vertebra 03/03/2013  . Constipation 03/03/2013  . Osteopenia 03/16/2012  . GERD  (gastroesophageal reflux disease) 03/16/2012  . Thrush 03/02/2012  . Chronic hypoxemic respiratory failure ([place]) 02/27/2012  . Hyperlipidemia 02/25/2012  . Hyperglycemia, drug-induced 09/25/2011  . Tobacco abuse 09/24/2011  . COPD (chronic obstructive pulmonary disease) ([place]) 12/24/2009    Past Surgical History:  Procedure Laterality Date  . CERVICAL BIOPSY    . COLONOSCOPY N/A 03/31/2013   Procedure: COLONOSCOPY;  Surgeon: Rogene Houston, MD;  Location: [facility] ENDO SUITE;  Service: Endoscopy;  Laterality: N/A;  730  . TUBAL LIGATION       OB History   None      Home Medications    Prior to Admission medications   Medication Sig Start Date End Date Taking? Authorizing Provider  albuterol (PROVENTIL HFA;VENTOLIN HFA) 108 (90 Base) MCG/ACT inhaler Inhale 2 puffs into the lungs every 6 (six) hours as needed for wheezing or shortness of breath. 05/18/18  Yes [place], Rose Fillers, MD  albuterol (PROVENTIL) (2.5 MG/3ML) 0.083% nebulizer solution INHALE 1 VIAL VIA NEBULIZER EVERY 6 HOURS AS NEEDED. Patient taking differently: Take 2.5 mg by nebulization every 6 (six) hours as needed for wheezing or shortness of breath.  06/16/18  Yes [place], Modena Nunnery, MD  alendronate (FOSAMAX) 70 MG tablet TAKE 1 TABLET BY MOUTH EVERY WEEK ON AN EMPTY STOMACH. Patient taking differently: Take 70 mg by mouth every Wednesday. TAKE  1 TABLET BY MOUTH EVERY WEEK ON AN EMPTY STOMACH. 07/20/18  Yes [place], Modena Nunnery, MD  aspirin EC 81 MG tablet Take 81 mg by mouth every morning.    Yes [provider]  budesonide-formoterol (SYMBICORT) 160-4.5 MCG/ACT inhaler Inhale 2 puffs into the lungs 2 (two) times daily. 04/27/18  Yes [place], Modena Nunnery, MD  Calcium Carb-Cholecalciferol 600-800 MG-UNIT TABS Take 1 tablet by mouth 2 (two) times daily.    Yes [provider]  diphenhydrAMINE (BENADRYL) 12.5 MG/5ML liquid Take 6.25 mg by mouth 4 (four) times daily as needed.   Yes [provider]  furosemide  (LASIX) 40 MG tablet Take 1 tablet (40 mg total) by mouth daily. Patient taking differently: Take 40-80 mg by mouth See admin instructions. Alternate taking 80mg  one day, then take 40mg  the next day, then repeat. 06/02/18  Yes [place], Modena Nunnery, MD  nystatin (MYCOSTATIN) 100000 UNIT/ML suspension Use as directed 5 mLs (500,000 Units total) in the mouth or throat 4 (four) times daily. 01/29/18  Yes [place], Modena Nunnery, MD  oxyCODONE-acetaminophen (PERCOCET/ROXICET) 5-325 MG tablet take 1 tablet by mouth every 8 hours if needed for pain Patient taking differently: Take 1 tablet by mouth 2 (two) times daily.  05/21/18  Yes [place], Modena Nunnery, MD  OXYGEN Inhale 3 L into the lungs continuous.    Yes [provider]  pantoprazole (PROTONIX) 40 MG tablet Take 1 tablet (40 mg total) by mouth 2 (two) times daily. 06/09/18  Yes [place], Modena Nunnery, MD  polyethylene glycol [facility] / GLYCOLAX) packet Take 17 g by mouth daily. 06/04/18  Yes [place], Ankit, MD  predniSONE (DELTASONE) 10 MG tablet TAKE 2 TABLETS BY MOUTH EVERY MORNING WITH FOOD Patient taking differently: Take 10 mg by mouth daily with breakfast.  04/29/18  Yes Byrum, Rose Fillers, MD  simvastatin (ZOCOR) 40 MG tablet Take 1 tablet (40 mg total) by mouth at bedtime. 04/27/18  Yes [place], Modena Nunnery, MD  Tiotropium Bromide Monohydrate (SPIRIVA RESPIMAT) 2.5 MCG/ACT AERS INHALE 2 PUFFS INTO THE LUNGS ONCE DAILY Patient taking differently: Inhale 2 puffs into the lungs at bedtime.  05/28/18  Yes Delsa Grana, PA-C  cyclobenzaprine (FLEXERIL) 10 MG tablet Take 1 tablet (10 mg total) by mouth 3 (three) times daily as needed for muscle spasms. Patient not taking: Reported on 08/10/2018 05/28/18   Delsa Grana, PA-C  polyethylene glycol (GOLYTELY) 236 g solution Drink one 8 ounce glass every hour until you have a bowel movement Patient not taking: Reported on 08/10/2018 05/27/18   Milton Ferguson, MD  predniSONE (DELTASONE) 50 MG tablet Take 1 tablet (50 mg total) by  mouth daily. 08/10/18   Dorie Rank, MD    Family History Family History  Problem Relation Age of Onset  . Heart disease Mother   . Hypertension Sister   . Hypertension Brother     Social History Social History   Tobacco Use  . Smoking status: Current Every Day Smoker    Packs/day: 1.00    Years: 25.00    Pack years: 25.00    Types: Cigarettes  . Smokeless tobacco: Never Used  Substance Use Topics  . Alcohol use: No  . Drug use: No     Allergies   Keflex [cephalexin]; Doxycycline; Sulfa antibiotics; Avelox [moxifloxacin hcl in nacl]; and Toradol [ketorolac tromethamine]   Review of Systems Review of Systems  HENT:       Face feels puffy and swollen  Musculoskeletal: Positive for back pain.  All  other systems reviewed and are negative.    Physical Exam Updated Vital Signs BP (!) 149/85 (BP Location: Right Arm)   Pulse (!) 112   Temp 98.6 F (37 C) (Oral)   Resp 16   Ht 1.549 m (5\' 1" )   Wt 89.8 kg   SpO2 96%   BMI 37.41 kg/m   Physical Exam  Constitutional: No distress.  HENT:  Head: Normocephalic and atraumatic.  Right Ear: External ear normal.  Left Ear: External ear normal.  Mouth/Throat: No oropharyngeal exudate or posterior oropharyngeal edema.  Eyes: Conjunctivae are normal. Right eye exhibits no discharge. Left eye exhibits no discharge. No scleral icterus.  Neck: Neck supple. No tracheal deviation present.  Cardiovascular: Normal rate, regular rhythm and intact distal pulses.  Pulmonary/Chest: Effort normal. No stridor. Tachypnea noted. No respiratory distress. She has decreased breath sounds. She has wheezes. She has no rales.  Abdominal: Soft. Bowel sounds are normal. She exhibits no distension. There is no tenderness. There is no rebound and no guarding.  Musculoskeletal: She exhibits no tenderness.       Right lower leg: She exhibits edema.       Left lower leg: She exhibits no tenderness.  Neurological: She is alert. She has normal  strength. No cranial nerve deficit (no facial droop, extraocular movements intact, no slurred speech) or sensory deficit. She exhibits normal muscle tone. She displays no seizure activity. Coordination normal.  Skin: Skin is warm and dry. No rash noted.  Psychiatric: She has a normal mood and affect.  Nursing note and vitals reviewed.    ED Treatments / Results  Labs (all labs ordered are listed, but only abnormal results are displayed) Labs Reviewed  BASIC METABOLIC PANEL - Abnormal; Notable for the following components:      Result Value   Potassium 3.4 (*)    Chloride 92 (*)    CO2 42 (*)    Glucose, Bld 179 (*)    BUN 7 (*)    All other components within normal limits  CBC WITH DIFFERENTIAL/PLATELET - Abnormal; Notable for the following components:   WBC 16.4 (*)    MCV 100.5 (*)    All other components within normal limits  BRAIN NATRIURETIC PEPTIDE  I-STAT TROPONIN, ED    EKG EKG Interpretation  Date/Time:  Tuesday August 10 2018 17:05:10 EDT Ventricular Rate:  116 PR Interval:    QRS Duration: 91 QT Interval:  349 QTC Calculation: 485 R Axis:   114 Text Interpretation:  Sinus tachycardia Right atrial enlargement Right axis deviation No significant change since last tracing Confirmed by Dorie Rank [phone]) on 08/10/2018 5:07:30 PM   Radiology Dg Chest Port 1 View  Result Date: 08/10/2018 CLINICAL DATA:  Shortness of breath EXAM: PORTABLE CHEST 1 VIEW COMPARISON:  05/25/2018, CT 06/04/2018 FINDINGS: Borderline to mild cardiomegaly. No acute airspace disease or pleural effusion. No pneumothorax. IMPRESSION: No active disease.  Borderline to mild cardiomegaly. Electronically Signed   By: Donavan Foil M.D.   On: 08/10/2018 17:58    Procedures Procedures (including critical care time)  Medications Ordered in ED Medications  albuterol (PROVENTIL) (2.5 MG/3ML) 0.083% nebulizer solution 5 mg (5 mg Nebulization Given 08/10/18 1810)  methylPREDNISolone sodium succinate  (SOLU-MEDROL) 125 mg/2 mL injection 125 mg (125 mg Intravenous Given 08/10/18 1758)  albuterol (PROVENTIL,VENTOLIN) solution continuous neb (10 mg/hr Nebulization Given 08/10/18 1818)  ipratropium (ATROVENT) nebulizer solution 0.5 mg (0.5 mg Nebulization Given 08/10/18 1819)     Initial Impression / Assessment  and Plan / ED Course  I have reviewed the triage vital signs and the nursing notes.  Pertinent labs & imaging results that were available during my care of the patient were reviewed by me and considered in my medical decision making (see chart for details).  Clinical Course as of Aug 10 2100  Tue Aug 10, 2018  2100 Pt now agrees to be admitted   [JK]    Clinical Course User Index [JK] Dorie Rank, MD  She presented to the emergency room complaints of shortness of breath.  On exam the patient had significant wheezing.  Her symptoms were consistent with a COPD exacerbation.  Laboratory tests and x-rays are reassuring.  No signs to suggest pneumonia or congestive heart failure.  Patient did have right-sided leg swelling.  I checked old records and she had a Doppler study of her left leg a couple months ago.  Tried to order an ultrasound while she was here in the emergency room this evening but it is no longer available at this time.  I recommended the patient be admitted to the hospital.  She refuses to be admitted.  I explained my concerns about her breathing and her leg swelling.  Patient states she has medications at home and will come back to the hospital if she needs to.  Final Clinical Impressions(s) / ED Diagnoses   Final diagnoses:  COPD exacerbation ([place])  Leg swelling    ED Discharge Orders         Ordered    predniSONE (DELTASONE) 50 MG tablet  Daily     08/10/18 2024           Dorie Rank, MD 08/10/18 2027 Patient now agrees to be admitted.  I will consult the medical service.   Dorie Rank, MD 08/10/18 2101

## 2018-08-10 NOTE — Discharge Instructions (Addendum)
Return to the hospital if you change your mind about being admitted, please  follow-up with your doctor this week.  Please take the steroids at the higher dose and then go back to your  usual dose.  Follow up with your doctor to discuss getting an ultrasound of your leg

## 2018-08-10 NOTE — ED Notes (Signed)
Pt has changed her mind about leaving AMA

## 2018-08-11 ENCOUNTER — Other Ambulatory Visit (HOSPITAL_COMMUNITY): Payer: Medicare Other

## 2018-08-11 ENCOUNTER — Ambulatory Visit: Payer: Medicare Other | Admitting: Family Medicine

## 2018-08-11 ENCOUNTER — Observation Stay (HOSPITAL_BASED_OUTPATIENT_CLINIC_OR_DEPARTMENT_OTHER): Payer: Medicare Other

## 2018-08-11 ENCOUNTER — Observation Stay (HOSPITAL_COMMUNITY): Payer: Medicare Other

## 2018-08-11 DIAGNOSIS — Z720 Tobacco use: Secondary | ICD-10-CM | POA: Diagnosis not present

## 2018-08-11 DIAGNOSIS — K219 Gastro-esophageal reflux disease without esophagitis: Secondary | ICD-10-CM | POA: Diagnosis not present

## 2018-08-11 DIAGNOSIS — R600 Localized edema: Secondary | ICD-10-CM | POA: Diagnosis not present

## 2018-08-11 DIAGNOSIS — E7800 Pure hypercholesterolemia, unspecified: Secondary | ICD-10-CM

## 2018-08-11 DIAGNOSIS — R0689 Other abnormalities of breathing: Secondary | ICD-10-CM

## 2018-08-11 DIAGNOSIS — R9431 Abnormal electrocardiogram [ECG] [EKG]: Secondary | ICD-10-CM | POA: Diagnosis not present

## 2018-08-11 DIAGNOSIS — E876 Hypokalemia: Secondary | ICD-10-CM | POA: Diagnosis not present

## 2018-08-11 DIAGNOSIS — J441 Chronic obstructive pulmonary disease with (acute) exacerbation: Secondary | ICD-10-CM | POA: Diagnosis not present

## 2018-08-11 DIAGNOSIS — I5189 Other ill-defined heart diseases: Secondary | ICD-10-CM | POA: Diagnosis not present

## 2018-08-11 DIAGNOSIS — T50905A Adverse effect of unspecified drugs, medicaments and biological substances, initial encounter: Secondary | ICD-10-CM | POA: Diagnosis not present

## 2018-08-11 DIAGNOSIS — I350 Nonrheumatic aortic (valve) stenosis: Secondary | ICD-10-CM | POA: Diagnosis not present

## 2018-08-11 DIAGNOSIS — R000 Tachycardia, unspecified: Secondary | ICD-10-CM | POA: Diagnosis not present

## 2018-08-11 DIAGNOSIS — R739 Hyperglycemia, unspecified: Secondary | ICD-10-CM | POA: Diagnosis not present

## 2018-08-11 DIAGNOSIS — J9611 Chronic respiratory failure with hypoxia: Secondary | ICD-10-CM | POA: Diagnosis not present

## 2018-08-11 LAB — COMPREHENSIVE METABOLIC PANEL
ALT: 35 U/L (ref 0–44)
ANION GAP: 13 (ref 5–15)
AST: 35 U/L (ref 15–41)
Albumin: 3.3 g/dL — ABNORMAL LOW (ref 3.5–5.0)
Alkaline Phosphatase: 55 U/L (ref 38–126)
BUN: 10 mg/dL (ref 8–23)
CO2: 38 mmol/L — ABNORMAL HIGH (ref 22–32)
Calcium: 9.3 mg/dL (ref 8.9–10.3)
Chloride: 90 mmol/L — ABNORMAL LOW (ref 98–111)
Creatinine, Ser: 0.78 mg/dL (ref 0.44–1.00)
Glucose, Bld: 220 mg/dL — ABNORMAL HIGH (ref 70–99)
POTASSIUM: 4.3 mmol/L (ref 3.5–5.1)
Sodium: 141 mmol/L (ref 135–145)
Total Bilirubin: 0.5 mg/dL (ref 0.3–1.2)
Total Protein: 6.4 g/dL — ABNORMAL LOW (ref 6.5–8.1)

## 2018-08-11 LAB — CBC WITH DIFFERENTIAL/PLATELET
Basophils Absolute: 0 10*3/uL (ref 0.0–0.1)
Basophils Relative: 0 %
EOS ABS: 0 10*3/uL (ref 0.0–0.7)
Eosinophils Relative: 0 %
HCT: 40.5 % (ref 36.0–46.0)
Hemoglobin: 12.2 g/dL (ref 12.0–15.0)
LYMPHS ABS: 1.1 10*3/uL (ref 0.7–4.0)
Lymphocytes Relative: 6 %
MCH: 30.2 pg (ref 26.0–34.0)
MCHC: 30.1 g/dL (ref 30.0–36.0)
MCV: 100.2 fL — AB (ref 78.0–100.0)
MONO ABS: 0.4 10*3/uL (ref 0.1–1.0)
Monocytes Relative: 3 %
Neutro Abs: 15.7 10*3/uL — ABNORMAL HIGH (ref 1.7–7.7)
Neutrophils Relative %: 91 %
PLATELETS: 290 10*3/uL (ref 150–400)
RBC: 4.04 MIL/uL (ref 3.87–5.11)
RDW: 14.4 % (ref 11.5–15.5)
WBC: 17.2 10*3/uL — ABNORMAL HIGH (ref 4.0–10.5)

## 2018-08-11 LAB — GLUCOSE, CAPILLARY
GLUCOSE-CAPILLARY: 191 mg/dL — AB (ref 70–99)
Glucose-Capillary: 158 mg/dL — ABNORMAL HIGH (ref 70–99)

## 2018-08-11 LAB — ECHOCARDIOGRAM COMPLETE
Height: 61 in
Weight: 3185.21 oz

## 2018-08-11 LAB — HEMOGLOBIN A1C
Hgb A1c MFr Bld: 6.9 % — ABNORMAL HIGH (ref 4.8–5.6)
MEAN PLASMA GLUCOSE: 151.33 mg/dL

## 2018-08-11 MED ORDER — FUROSEMIDE 40 MG PO TABS
40.0000 mg | ORAL_TABLET | ORAL | Status: DC
  Administered 2018-08-12 – 2018-08-14 (×2): 40 mg via ORAL
  Filled 2018-08-11 (×5): qty 1

## 2018-08-11 MED ORDER — PNEUMOCOCCAL VAC POLYVALENT 25 MCG/0.5ML IJ INJ
0.5000 mL | INJECTION | INTRAMUSCULAR | Status: DC
  Filled 2018-08-11 (×2): qty 0.5

## 2018-08-11 MED ORDER — ENOXAPARIN SODIUM 40 MG/0.4ML ~~LOC~~ SOLN
40.0000 mg | SUBCUTANEOUS | Status: DC
  Administered 2018-08-11: 40 mg via SUBCUTANEOUS
  Filled 2018-08-11: qty 0.4

## 2018-08-11 MED ORDER — POTASSIUM CHLORIDE CRYS ER 20 MEQ PO TBCR
40.0000 meq | EXTENDED_RELEASE_TABLET | Freq: Two times a day (BID) | ORAL | Status: DC
  Administered 2018-08-11: 40 meq via ORAL
  Filled 2018-08-11 (×2): qty 2

## 2018-08-11 MED ORDER — HYDRALAZINE HCL 20 MG/ML IJ SOLN
10.0000 mg | INTRAMUSCULAR | Status: DC | PRN
  Filled 2018-08-11: qty 1

## 2018-08-11 MED ORDER — INSULIN ASPART 100 UNIT/ML ~~LOC~~ SOLN
6.0000 [IU] | Freq: Three times a day (TID) | SUBCUTANEOUS | Status: DC
  Administered 2018-08-11 – 2018-08-20 (×14): 6 [IU] via SUBCUTANEOUS

## 2018-08-11 MED ORDER — INSULIN ASPART 100 UNIT/ML ~~LOC~~ SOLN
0.0000 [IU] | Freq: Three times a day (TID) | SUBCUTANEOUS | Status: DC
  Administered 2018-08-11: 4 [IU] via SUBCUTANEOUS
  Administered 2018-08-12: 3 [IU] via SUBCUTANEOUS
  Administered 2018-08-12: 5 [IU] via SUBCUTANEOUS
  Administered 2018-08-13 – 2018-08-14 (×2): 3 [IU] via SUBCUTANEOUS
  Administered 2018-08-15: 4 [IU] via SUBCUTANEOUS
  Administered 2018-08-15: 7 [IU] via SUBCUTANEOUS
  Administered 2018-08-16: 3 [IU] via SUBCUTANEOUS
  Administered 2018-08-16: 7 [IU] via SUBCUTANEOUS
  Administered 2018-08-17: 4 [IU] via SUBCUTANEOUS
  Administered 2018-08-18: 7 [IU] via SUBCUTANEOUS
  Administered 2018-08-18: 4 [IU] via SUBCUTANEOUS
  Administered 2018-08-18 – 2018-08-19 (×2): 3 [IU] via SUBCUTANEOUS
  Administered 2018-08-20: 7 [IU] via SUBCUTANEOUS
  Administered 2018-08-20: 3 [IU] via SUBCUTANEOUS
  Administered 2018-08-20: 4 [IU] via SUBCUTANEOUS
  Administered 2018-08-21: 3 [IU] via SUBCUTANEOUS

## 2018-08-11 MED ORDER — INSULIN ASPART 100 UNIT/ML ~~LOC~~ SOLN: 0.0000 [IU] | Freq: Every day | SUBCUTANEOUS | Status: DC

## 2018-08-11 MED ORDER — METOPROLOL TARTRATE 25 MG PO TABS
12.5000 mg | ORAL_TABLET | Freq: Two times a day (BID) | ORAL | Status: DC
  Administered 2018-08-11 (×2): 12.5 mg via ORAL
  Filled 2018-08-11 (×2): qty 1

## 2018-08-11 MED ORDER — MOMETASONE FURO-FORMOTEROL FUM 200-5 MCG/ACT IN AERO
2.0000 | INHALATION_SPRAY | Freq: Two times a day (BID) | RESPIRATORY_TRACT | Status: DC
  Administered 2018-08-11 – 2018-08-21 (×18): 2 via RESPIRATORY_TRACT
  Filled 2018-08-11 (×3): qty 8.8

## 2018-08-11 NOTE — Progress Notes (Signed)
PROGRESS NOTE    Cindy [name]  TGG:[id number]  DOB: July 18, 1956  DOA: 08/10/2018 PCP: Alycia Rossetti, MD   Brief Admission Hx: Cindy [name] is a 62 y.o. female with medical history significant of left adrenal adenoma, osteoarthritis, history of lumbar compression fracture, hyperlipidemia, urolithiasis, drug-induced hyperglycemia, osteoporosis, hypothyroidism, COPD on home oxygen who is coming to the emergency department with complaints of progressively worse shortness of breath for the past week or so.   Pt was admitted for acute COPD exacerbation.   MDM/Assessment & Plan:   1. Acute COPD exacerbation - continue current treatments.  Follow clinically.  Encouraged smoking cessation.  Follow up with pulmonology outpatient.  2. RLE edema - Doppler US pending of BLEs.  Follow up.   3. Tobacco abuse - counseled at bedside about smoking cessation and dangers.  Pt verbalized understanding.  4. Hyperglycemia - from steroids - follow and provide sliding scale coverage as needed. 5. Diastolic heart dysfunction-stable on furosemide.  Echocardiogram pending. 6. Hyperlipidemia-continue simvastatin daily. 7. Hypokalemia-oral replacement ordered and magnesium ordered.   DVT prophylaxis: Lovenox Code Status: Full Family Communication: Patient Disposition Plan: Home tomorrow if clinically improved.   Subjective: Patient says she continues to cough and wheeze.  She is having palpitations.  Objective: Vitals:   08/11/18 0506 08/11/18 0509 08/11/18 0640 08/11/18 0758  BP:  140/82    Pulse:  (!) 112    Resp:  (!) 24    Temp:  97.7 F (36.5 C)    TempSrc:  Oral    SpO2:  97% 96% 93%  Weight: 90.3 kg     Height:        Intake/Output Summary (Last 24 hours) at 08/11/2018 1233 Last data filed at 08/11/2018 0900 Gross per 24 hour  Intake 290 ml  Output -  Net 290 ml   Filed Weights   08/10/18 1653 08/11/18 0506  Weight: 89.8 kg 90.3 kg     REVIEW OF SYSTEMS  As per history  otherwise all reviewed and reported negative  Exam:  General exam: Morbidly obese awake alert in no apparent distress, cooperative, audible wheezing heard. Respiratory system: `Diffuse inspiratory/expiratory wheezes and poor air movement.  No increased work of breathing. Cardiovascular system: S1 & S2 heard, tachycardic rate.  No JVD, murmurs, gallops.  Bilateral pedal edema right greater than left. Gastrointestinal system: Abdomen is nondistended, soft and nontender. Normal bowel sounds heard. Central nervous system: Alert and oriented. No focal neurological deficits. Extremities: Bilateral lower extremity edema right greater than left.  Data Reviewed: Basic Metabolic Panel: Recent Labs  Lab 08/10/18 1734 08/11/18 0458  NA 142 141  K 3.4* 4.3  CL 92* 90*  CO2 42* 38*  GLUCOSE 179* 220*  BUN 7* 10  CREATININE 0.81 0.78  CALCIUM 9.1 9.3  MG 2.0  --   PHOS 2.9  --    Liver Function Tests: Recent Labs  Lab 08/11/18 0458  AST 35  ALT 35  ALKPHOS 55  BILITOT 0.5  PROT 6.4*  ALBUMIN 3.3*   No results for input(s): LIPASE, AMYLASE in the last 168 hours. No results for input(s): AMMONIA in the last 168 hours. CBC: Recent Labs  Lab 08/10/18 1734 08/11/18 0458  WBC 16.4* 17.2*  NEUTROABS 12.9 15.7*  HGB 13.1 12.2  HCT 42.8 40.5  MCV 100.5* 100.2*  PLT 269 290   Cardiac Enzymes: No results for input(s): CKTOTAL, CKMB, CKMBINDEX, TROPONINI in the last 168 hours. CBG (last 3)  No results for  input(s): GLUCAP in the last 72 hours. No results found for this or any previous visit (from the past 240 hour(s)).   Studies: US Venous Img Lower Bilateral  Result Date: 08/11/2018 CLINICAL DATA:  Bilateral lower extremity edema. History of left-sided Baker cyst. Evaluate for DVT. EXAM: BILATERAL LOWER EXTREMITY VENOUS DOPPLER ULTRASOUND TECHNIQUE: Gray-scale sonography with graded compression, as well as color Doppler and duplex ultrasound were performed to evaluate the lower  extremity deep venous systems from the level of the common femoral vein and including the common femoral, femoral, profunda femoral, popliteal and calf veins including the posterior tibial, peroneal and gastrocnemius veins when visible. The superficial great saphenous vein was also interrogated. Spectral Doppler was utilized to evaluate flow at rest and with distal augmentation maneuvers in the common femoral, femoral and popliteal veins. COMPARISON:  Left lower extremity venous Doppler ultrasound-05/25/2018 FINDINGS: RIGHT LOWER EXTREMITY Common Femoral Vein: No evidence of thrombus. Normal compressibility, respiratory phasicity and response to augmentation. Saphenofemoral Junction: No evidence of thrombus. Normal compressibility and flow on color Doppler imaging. Profunda Femoral Vein: No evidence of thrombus. Normal compressibility and flow on color Doppler imaging. Femoral Vein: No evidence of thrombus. Normal compressibility, respiratory phasicity and response to augmentation. Popliteal Vein: No evidence of thrombus. Normal compressibility, respiratory phasicity and response to augmentation. Calf Veins: No evidence of thrombus. Normal compressibility and flow on color Doppler imaging. Superficial Great Saphenous Vein: No evidence of thrombus. Normal compressibility. Venous Reflux:  None. Other Findings:  None. LEFT LOWER EXTREMITY Common Femoral Vein: No evidence of thrombus. Normal compressibility, respiratory phasicity and response to augmentation. Saphenofemoral Junction: No evidence of thrombus. Normal compressibility and flow on color Doppler imaging. Profunda Femoral Vein: No evidence of thrombus. Normal compressibility and flow on color Doppler imaging. Femoral Vein: No evidence of thrombus. Normal compressibility, respiratory phasicity and response to augmentation. Popliteal Vein: No evidence of thrombus. Normal compressibility, respiratory phasicity and response to augmentation. Calf Veins: No evidence  of thrombus. Normal compressibility and flow on color Doppler imaging. Superficial Great Saphenous Vein: No evidence of thrombus. Normal compressibility. Venous Reflux:  None. Other Findings: There is a serpiginous approximately 5.7 x 6.2 x 2.7 cm fluid collection within the left popliteal fossa compatible with a Baker cyst, grossly unchanged compared to the 04/2018 examination, previously, 5.9 cm IMPRESSION: 1. No evidence of DVT within either lower extremity. 2. 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 Electronically Signed   By: Sandi Mariscal M.D.   On: 08/11/2018 11:12   Dg Chest Port 1 View  Result Date: 08/10/2018 CLINICAL DATA:  Shortness of breath EXAM: PORTABLE CHEST 1 VIEW COMPARISON:  05/25/2018, CT 06/04/2018 FINDINGS: Borderline to mild cardiomegaly. No acute airspace disease or pleural effusion. No pneumothorax. IMPRESSION: No active disease.  Borderline to mild cardiomegaly. Electronically Signed   By: Donavan Foil M.D.   On: 08/10/2018 17:58   Scheduled Meds: . aspirin EC  81 mg Oral q morning - 10a  . calcium-vitamin D  1 tablet Oral BID  . [START ON 08/12/2018] furosemide  40 mg Oral QODAY  . furosemide  80 mg Oral QODAY  . ipratropium-albuterol  3 mL Nebulization Q6H  . methylPREDNISolone (SOLU-MEDROL) injection  60 mg Intravenous Q6H  . nicotine  21 mg Transdermal Daily  . pantoprazole  40 mg Oral BID  . [START ON 08/12/2018] pneumococcal 23 valent vaccine  0.5 mL Intramuscular Tomorrow-1000  . polyethylene glycol  17 g Oral Daily  . simvastatin  40 mg  Oral QHS   Continuous Infusions:  Principal Problem:   COPD with acute exacerbation (HCC) Active Problems:   Tobacco abuse   Hyperglycemia, drug-induced   Hyperlipidemia   GERD (gastroesophageal reflux disease)   Sinus tachycardia   Hypercarbia   Edema of left lower extremity   Hypokalemia   Abnormal EKG   Diastolic dysfunction   Time spent:   Irwin Brakeman, MD,  FAAFP [facility] Pager [phone]  If 7PM-7AM, please contact night-coverage [email or web address] Password [email or web address] 08/11/2018, 12:33 PM    LOS: 0 days

## 2018-08-11 NOTE — Care Management Obs Status (Signed)
[facility] NOTIFICATION   Patient Details  Name: Cindy [name] MRN: [id number] Date of Birth: 08/20/1956   Medicare Observation Status Notification Given:  Yes    Nashira Mcglynn, Chauncey Reading, RN 08/11/2018, 10:25 AM

## 2018-08-11 NOTE — Care Management Note (Addendum)
Case Management Note  Patient Details  Name: Cindy [name] MRN: [id number] Date of Birth: [birth year]  Subjective/Objective:       COPD. From home, independent. Has PCP and insurance with prescription coverage.      Has continuous oxygen at home.        Action/Plan: DC home with self care. Will have [facility] EMMI calls.  Expected Discharge Date:  08/14/18               Expected Discharge Plan:  Home/Self Care  In-House Referral:     Discharge planning Services  CM Consult  Post Acute Care Choice:  NA Choice offered to:  NA  DME Arranged:    DME Agency:     HH Arranged:    HH Agency:     Status of Service:  Completed, signed off  If discussed at [employer] of Stay Meetings, dates discussed:    Additional Comments:  Nechelle Petrizzo, Chauncey Reading, RN 08/11/2018, 12:01 PM

## 2018-08-11 NOTE — Progress Notes (Signed)
*  PRELIMINARY RESULTS* Echocardiogram 2D Echocardiogram has been performed.  Samuel Germany 08/11/2018, 3:09 PM

## 2018-08-11 NOTE — Progress Notes (Signed)
BP has been in 140's, 150's and jumped over 180 once.  Tachycardic.  Says has been elevated at primary md appts and no bp med ordered by primary md.  Contacted Dr. Wynetta Emery

## 2018-08-11 NOTE — Evaluation (Signed)
Occupational Therapy Evaluation Patient Details Name: Cindy [name] MRN: [id number] DOB: Sep 21, 1956 Today's Date: 08/11/2018    History of Present Illness Cindy [name] is a 62 y.o. female with medical history significant of left adrenal adenoma, osteoarthritis, history of lumbar compression fracture, hyperlipidemia, urolithiasis, drug-induced hyperglycemia, osteoporosis, hypothyroidism, COPD on home oxygen who is coming to the emergency department with complaints of progressively worse shortness of breath for the past week or so.  She denies travel history or sick contacts.  She still smokes a half a pack of cigarettes a day.  She denies fever, chills, but complains of fatigue, sore throat, productive cough of whitish sputum and wheezing she denies chest pain, palpitations, dizziness, diaphoresis, PND, orthopnea, but complains of left unilateral lower extremity edema.  She complains of occasional nausea, but denies abdominal pain, emesis, diarrhea, melena or hematochezia.  She gets occasional constipation, but states this is controlled with MiraLAX.  No dysuria, frequency or hematuria.  Denies polyuria, polydipsia, polyphagia or blurred vision.  No heat or cold intolerance.   Clinical Impression   Pt received sitting up in the middle of the bed, marginally agreeable to OT evaluation. Pt reporting she does not need or want any rehab services. Pt is functioning at baseline for ADLs, has assistance from friend 24/7. Educated pt on energy conservation strategies to begin implementing into her daily routine. Pt not interestd in education, however accepted information packet to keep. No further OT services required at this time.     Follow Up Recommendations  No OT follow up    Equipment Recommendations  None recommended by OT       Precautions / Restrictions Precautions Precautions: None Restrictions Weight Bearing Restrictions: No      Mobility Bed Mobility Overal bed mobility: Modified  Independent                Transfers                 General transfer comment: not completed        ADL either performed or assessed with clinical judgement   ADL Overall ADL's : Needs assistance/impaired                                       General ADL Comments: Pt is at baseline with ADLs. Pt able to completed seated ADLs at mod independent level, unable to tolerate standing for standing ADLs. Pt able to perform functional mobility for short distances with rest breaks due to SOB     Vision Baseline Vision/History: No visual deficits Patient Visual Report: No change from baseline Vision Assessment?: No apparent visual deficits            Pertinent Vitals/Pain Pain Assessment: No/denies pain     Hand Dominance Right   Extremity/Trunk Assessment Upper Extremity Assessment Upper Extremity Assessment: Generalized weakness   Lower Extremity Assessment Lower Extremity Assessment: Defer to PT evaluation   Cervical / Trunk Assessment Cervical / Trunk Assessment: Normal   Communication Communication Communication: No difficulties   Cognition Arousal/Alertness: Awake/alert Behavior During Therapy: WFL for tasks assessed/performed Overall Cognitive Status: Within Functional Limits for tasks assessed  Home Living Family/patient expects to be discharged to:: Private residence Living Arrangements: Non-relatives/Friends Available Help at Discharge: Friend(s);Available 24 hours/day Type of Home: Mobile home Home Access: Stairs to enter Entrance Stairs-Number of Steps: 4   Home Layout: One level     Bathroom Shower/Tub: Teacher, early years/pre: Standard     Home Equipment: Environmental consultant - 2 wheels;Shower seat          Prior Functioning/Environment Level of Independence: Needs assistance  Gait / Transfers Assistance Needed: Used rolling walker when back was  hurting ADL's / Homemaking Assistance Needed: friend assists with bathing and additional ADLs as needed            OT Problem List: Decreased activity tolerance       AM-PAC PT "6 Clicks" Daily Activity     Outcome Measure Help from another person eating meals?: None Help from another person taking care of personal grooming?: None Help from another person toileting, which includes using toliet, bedpan, or urinal?: None Help from another person bathing (including washing, rinsing, drying)?: A Little Help from another person to put on and taking off regular upper body clothing?: None Help from another person to put on and taking off regular lower body clothing?: A Little 6 Click Score: 22   End of Session    Activity Tolerance: Other (comment)(limited by respiratory status) Patient left: in bed;with call bell/phone within reach;Other (comment)(respiratory therapy in room)  OT Visit Diagnosis: Repeated falls (R29.6)                Time: 2010-0712 OT Time Calculation (min): 21 min Charges:  OT General Charges $OT Visit: 1 Visit OT Evaluation $OT Eval Low Complexity: 1 Low OT Treatments $Self Care/Home Management : 8-22 mins   Guadelupe Sabin, OTR/L  [phone] 08/11/2018, 8:02 AM

## 2018-08-11 NOTE — Evaluation (Signed)
Physical Therapy Evaluation Patient Details Name: Cindy [name] MRN: [id number] DOB: 09/15/1956 Today's Date: 08/11/2018   History of Present Illness  Cindy [name] is a 62 y.o. female with medical history significant of left adrenal adenoma, osteoarthritis, history of lumbar compression fracture, hyperlipidemia, urolithiasis, drug-induced hyperglycemia, osteoporosis, hypothyroidism, COPD on home oxygen who is coming to the emergency department with complaints of progressively worse shortness of breath for the past week or so.  She denies travel history or sick contacts.  She still smokes a half a pack of cigarettes a day.  She denies fever, chills, but complains of fatigue, sore throat, productive cough of whitish sputum and wheezing she denies chest pain, palpitations, dizziness, diaphoresis, PND, orthopnea, but complains of left unilateral lower extremity edema.  She complains of occasional nausea, but denies abdominal pain, emesis, diarrhea, melena or hematochezia.  She gets occasional constipation, but states this is controlled with MiraLAX.  No dysuria, frequency or hematuria.  Denies polyuria, polydipsia, polyphagia or blurred vision.  No heat or cold intolerance.    Clinical Impression  Patient functioning near baseline for functional mobility and gait, other than limited due to c/o SOB with exertion, able to ambulate in hallway with O2 saturation between 95-98% while on 2 LPM, once seated at bedside patient desaturates to below 90% possibly due to anxiety with poor carryover for pursed lipped breathing.  Plan:  Patient discharged from physical therapy to care of nursing for ambulation daily as tolerated for length of stay.      Follow Up Recommendations No PT follow up    Equipment Recommendations  None recommended by PT    Recommendations for Other Services       Precautions / Restrictions Precautions Precautions: None Restrictions Weight Bearing Restrictions: No      Mobility  Bed Mobility Overal bed mobility: Independent                Transfers Overall transfer level: Independent                  Ambulation/Gait Ambulation/Gait assistance: Modified independent (Device/Increase time) Gait Distance (Feet): 40 Feet Assistive device: (pushing O2 tank/vitals machine) Gait Pattern/deviations: WFL(Within Functional Limits) Gait velocity: normal   General Gait Details: grossly WFL limits, limited due to SOB  Stairs            Wheelchair Mobility    Modified Rankin (Stroke Patients Only)       Balance Overall balance assessment: No apparent balance deficits (not formally assessed)                                           Pertinent Vitals/Pain Pain Assessment: No/denies pain    Home Living Family/patient expects to be discharged to:: Private residence Living Arrangements: Non-relatives/Friends Available Help at Discharge: Friend(s);Available 24 hours/day Type of Home: Mobile home Home Access: Stairs to enter Entrance Stairs-Rails: Right;Left;Can reach both Entrance Stairs-Number of Steps: 4 Home Layout: One level Home Equipment: Walker - 2 wheels;Wheelchair - manual;Shower seat      Prior Function Level of Independence: Independent with assistive device(s)   Gait / Transfers Assistance Needed: household & short distanced community ambulator with RW PRN  ADL's / Homemaking Assistance Needed: assisted by friends        Hand Dominance   Dominant Hand: Right    Extremity/Trunk Assessment   Upper Extremity Assessment Upper Extremity  Assessment: Defer to OT evaluation    Lower Extremity Assessment Lower Extremity Assessment: Overall WFL for tasks assessed    Cervical / Trunk Assessment Cervical / Trunk Assessment: Normal  Communication   Communication: No difficulties  Cognition Arousal/Alertness: Awake/alert Behavior During Therapy: WFL for tasks assessed/performed Overall Cognitive Status:  Within Functional Limits for tasks assessed                                        General Comments      Exercises     Assessment/Plan    PT Assessment Patent does not need any further PT services  PT Problem List         PT Treatment Interventions      PT Goals (Current goals can be found in the Care Plan section)  Acute Rehab PT Goals Patient Stated Goal: return home PT Goal Formulation: With patient Time For Goal Achievement: 08/11/18 Potential to Achieve Goals: Good    Frequency     Barriers to discharge        Co-evaluation               AM-PAC PT "6 Clicks" Daily Activity  Outcome Measure Difficulty turning over in bed (including adjusting bedclothes, sheets and blankets)?: None Difficulty moving from lying on back to sitting on the side of the bed? : None Difficulty sitting down on and standing up from a chair with arms (e.g., wheelchair, bedside commode, etc,.)?: None Help needed moving to and from a bed to chair (including a wheelchair)?: None Help needed walking in hospital room?: None Help needed climbing 3-5 steps with a railing? : None 6 Click Score: 24    End of Session   Activity Tolerance: Patient tolerated treatment well(Patient limited secondary to c/o SOB) Patient left: in bed;with call bell/phone within reach(seated at bedside) Nurse Communication: Mobility status PT Visit Diagnosis: Unsteadiness on feet (R26.81);Other abnormalities of gait and mobility (R26.89);Muscle weakness (generalized) (M62.81)    Time: 3557-3220 PT Time Calculation (min) (ACUTE ONLY): 25 min   Charges:   PT Evaluation $PT Eval Moderate Complexity: 1 Mod PT Treatments $Therapeutic Activity: 23-37 mins        2:26 PM, 08/11/18 Lonell Grandchild, MPT Physical Therapist with [facility] [phone] office [phone] mobile phone

## 2018-08-11 NOTE — Progress Notes (Addendum)
Initial Nutrition Assessment  DOCUMENTATION CODES:   Obesity unspecified  INTERVENTION:  General Healthful diet education offered   Agree with Heart Healthy/ CHO modified diet -appropriate for home diet as well  NUTRITION DIAGNOSIS:  Food and nutrition knowledge deficit related to COPD Nutrition Therapy as evidenced by pt lack of receptivity to receive education.  GOAL:  Patient will meet greater than or equal to 90% of their needs   MONITOR:  PO intake, Weight trends, Labs   REASON FOR ASSESSMENT:   Consult Assessment of nutrition requirement/status  ASSESSMENT:  Patient is a 62 yo female with hx of COPD (home O2 @ 3 Liters), Adrenal adenoma. Tobacco smoker.   Presented to ED with shortness of breath- acute COPD exacerbation, HLD, Hyperglycemia.   Patient endorses good appetite and meal intake is 75%- no chewing or swallow difficulty per pt. Home diet is regular. Breakfast varies between [employer] pancakes, gravy biscuit or cereal - sometimes nabs and coke. Lunch - frequently eats at [employer] Table veggie plates most often. Dinner - is soup, leftovers or snack foods -chips, doritos etc.  RD reviewed Healthy Meals as you get older handout with patient and offered to provide COPD related meal planning ideas. Limited interest in education. Expect poor compliance with suggestions. A number of her food and beverage choices lack nutrient value and are high in sodium and / or fat.  Patient weight - between 88-91 kg. Moderate bilateral lower extremity edema. Patient is on a diuretic medication at home. Ambulates with a walker. Mild muscle loss and ample fat mass.  Labs: BMP Latest Ref Rng & Units 08/11/2018 08/10/2018 07/09/2018  Glucose 70 - 99 mg/dL 220(H) 179(H) 163(H)  BUN 8 - 23 mg/dL 10 7(L) 7  Creatinine 0.44 - 1.00 mg/dL 0.78 0.81 0.83  BUN/Creat Ratio 6 - 22 (calc) - - NOT APPLICABLE  Sodium 948 - 145 mmol/L 141 142 138  Potassium 3.5 - 5.1 mmol/L 4.3 3.4(L) 4.0  Chloride  98 - 111 mmol/L 90(L) 92(L) 87(L)  CO2 22 - 32 mmol/L 38(H) 42(H) 37(H)  Calcium 8.9 - 10.3 mg/dL 9.3 9.1 9.4     Medications reviewed and include: Miralax, Protonix, calcium vitamin D, Lasix, Potassium, novolog, solumedrol, Nicoderm.   NUTRITION - FOCUSED PHYSICAL EXAM:    Most Recent Value  Orbital Region  No depletion  Upper Arm Region  No depletion  Thoracic and Lumbar Region  No depletion  Buccal Region  No depletion  Temple Region  Mild depletion  Clavicle Bone Region  No depletion  Clavicle and Acromion Bone Region  Mild depletion  Scapular Bone Region  No depletion  Dorsal Hand  No depletion  Patellar Region  No depletion  Anterior Thigh Region  No depletion  Edema (RD Assessment)  Moderate  Hair  Reviewed  Eyes  Reviewed  Mouth  Reviewed  Skin  Reviewed      Diet Order:   Diet Order            Diet heart healthy/carb modified Room service appropriate? Yes; Fluid consistency: Thin  Diet effective now              EDUCATION NEEDS:   Education needs have been addressed   Skin:  Skin Assessment: Reviewed RN Assessment  Last BM:  08/10/18  Height:   Ht Readings from Last 1 Encounters:  08/10/18 5\' 1"  (1.549 m)    Weight:   Wt Readings from Last 1 Encounters:  08/11/18 90.3 kg  Ideal Body Weight:  48 kg  BMI:  Body mass index is 37.62 kg/m.  Estimated Nutritional Needs:   Kcal:  1620-1800 (18-20 kcal/kg)  Protein:  95-99 gr (~2 gr/kg/IBW)  Fluid:  1.6-1.8 liters daily  Colman Cater MS,RD,CSG,LDN Office: [phone] Pager: [phone]

## 2018-08-12 ENCOUNTER — Encounter (HOSPITAL_COMMUNITY)
Admission: EM | Disposition: A | Discharge status: Home Health | Payer: Self-pay | Source: Home / Self Care | Attending: Family Medicine

## 2018-08-12 DIAGNOSIS — I5189 Other ill-defined heart diseases: Secondary | ICD-10-CM | POA: Diagnosis not present

## 2018-08-12 DIAGNOSIS — J9602 Acute respiratory failure with hypercapnia: Secondary | ICD-10-CM | POA: Diagnosis present

## 2018-08-12 DIAGNOSIS — J441 Chronic obstructive pulmonary disease with (acute) exacerbation: Secondary | ICD-10-CM | POA: Diagnosis present

## 2018-08-12 DIAGNOSIS — I10 Essential (primary) hypertension: Secondary | ICD-10-CM

## 2018-08-12 DIAGNOSIS — Z9861 Coronary angioplasty status: Secondary | ICD-10-CM | POA: Diagnosis not present

## 2018-08-12 DIAGNOSIS — R9431 Abnormal electrocardiogram [ECG] [EKG]: Secondary | ICD-10-CM | POA: Diagnosis not present

## 2018-08-12 DIAGNOSIS — Z882 Allergy status to sulfonamides status: Secondary | ICD-10-CM | POA: Diagnosis not present

## 2018-08-12 DIAGNOSIS — I208 Other forms of angina pectoris: Secondary | ICD-10-CM

## 2018-08-12 DIAGNOSIS — Z886 Allergy status to analgesic agent status: Secondary | ICD-10-CM | POA: Diagnosis not present

## 2018-08-12 DIAGNOSIS — E785 Hyperlipidemia, unspecified: Secondary | ICD-10-CM | POA: Diagnosis present

## 2018-08-12 DIAGNOSIS — I272 Pulmonary hypertension, unspecified: Secondary | ICD-10-CM | POA: Diagnosis present

## 2018-08-12 DIAGNOSIS — T380X5A Adverse effect of glucocorticoids and synthetic analogues, initial encounter: Secondary | ICD-10-CM | POA: Diagnosis present

## 2018-08-12 DIAGNOSIS — R000 Tachycardia, unspecified: Secondary | ICD-10-CM | POA: Diagnosis not present

## 2018-08-12 DIAGNOSIS — M4854XA Collapsed vertebra, not elsewhere classified, thoracic region, initial encounter for fracture: Secondary | ICD-10-CM | POA: Diagnosis present

## 2018-08-12 DIAGNOSIS — R600 Localized edema: Secondary | ICD-10-CM | POA: Diagnosis not present

## 2018-08-12 DIAGNOSIS — Z7952 Long term (current) use of systemic steroids: Secondary | ICD-10-CM | POA: Diagnosis not present

## 2018-08-12 DIAGNOSIS — F17210 Nicotine dependence, cigarettes, uncomplicated: Secondary | ICD-10-CM | POA: Diagnosis present

## 2018-08-12 DIAGNOSIS — E876 Hypokalemia: Secondary | ICD-10-CM | POA: Diagnosis present

## 2018-08-12 DIAGNOSIS — Z79899 Other long term (current) drug therapy: Secondary | ICD-10-CM | POA: Diagnosis not present

## 2018-08-12 DIAGNOSIS — Z7982 Long term (current) use of aspirin: Secondary | ICD-10-CM | POA: Diagnosis not present

## 2018-08-12 DIAGNOSIS — I97638 Postprocedural hematoma of a circulatory system organ or structure following other circulatory system procedure: Secondary | ICD-10-CM | POA: Diagnosis not present

## 2018-08-12 DIAGNOSIS — T50905A Adverse effect of unspecified drugs, medicaments and biological substances, initial encounter: Secondary | ICD-10-CM

## 2018-08-12 DIAGNOSIS — R079 Chest pain, unspecified: Secondary | ICD-10-CM | POA: Diagnosis not present

## 2018-08-12 DIAGNOSIS — R0601 Orthopnea: Secondary | ICD-10-CM | POA: Diagnosis not present

## 2018-08-12 DIAGNOSIS — J9611 Chronic respiratory failure with hypoxia: Secondary | ICD-10-CM | POA: Diagnosis not present

## 2018-08-12 DIAGNOSIS — Z883 Allergy status to other anti-infective agents status: Secondary | ICD-10-CM | POA: Diagnosis not present

## 2018-08-12 DIAGNOSIS — I214 Non-ST elevation (NSTEMI) myocardial infarction: Principal | ICD-10-CM

## 2018-08-12 DIAGNOSIS — I110 Hypertensive heart disease with heart failure: Secondary | ICD-10-CM | POA: Diagnosis present

## 2018-08-12 DIAGNOSIS — Z7951 Long term (current) use of inhaled steroids: Secondary | ICD-10-CM | POA: Diagnosis not present

## 2018-08-12 DIAGNOSIS — I5043 Acute on chronic combined systolic (congestive) and diastolic (congestive) heart failure: Secondary | ICD-10-CM | POA: Diagnosis present

## 2018-08-12 DIAGNOSIS — E1165 Type 2 diabetes mellitus with hyperglycemia: Secondary | ICD-10-CM | POA: Diagnosis present

## 2018-08-12 DIAGNOSIS — R0602 Shortness of breath: Secondary | ICD-10-CM | POA: Diagnosis not present

## 2018-08-12 DIAGNOSIS — M7989 Other specified soft tissue disorders: Secondary | ICD-10-CM | POA: Diagnosis not present

## 2018-08-12 DIAGNOSIS — I5033 Acute on chronic diastolic (congestive) heart failure: Secondary | ICD-10-CM | POA: Diagnosis not present

## 2018-08-12 DIAGNOSIS — Z9981 Dependence on supplemental oxygen: Secondary | ICD-10-CM | POA: Diagnosis not present

## 2018-08-12 DIAGNOSIS — I2510 Atherosclerotic heart disease of native coronary artery without angina pectoris: Secondary | ICD-10-CM | POA: Diagnosis not present

## 2018-08-12 DIAGNOSIS — Z720 Tobacco use: Secondary | ICD-10-CM | POA: Diagnosis not present

## 2018-08-12 DIAGNOSIS — R739 Hyperglycemia, unspecified: Secondary | ICD-10-CM | POA: Diagnosis not present

## 2018-08-12 DIAGNOSIS — J9620 Acute and chronic respiratory failure, unspecified whether with hypoxia or hypercapnia: Secondary | ICD-10-CM | POA: Diagnosis present

## 2018-08-12 DIAGNOSIS — K219 Gastro-esophageal reflux disease without esophagitis: Secondary | ICD-10-CM | POA: Diagnosis present

## 2018-08-12 DIAGNOSIS — R0603 Acute respiratory distress: Secondary | ICD-10-CM | POA: Diagnosis not present

## 2018-08-12 DIAGNOSIS — E7800 Pure hypercholesterolemia, unspecified: Secondary | ICD-10-CM | POA: Diagnosis not present

## 2018-08-12 DIAGNOSIS — E875 Hyperkalemia: Secondary | ICD-10-CM | POA: Diagnosis present

## 2018-08-12 DIAGNOSIS — Z888 Allergy status to other drugs, medicaments and biological substances status: Secondary | ICD-10-CM | POA: Diagnosis not present

## 2018-08-12 DIAGNOSIS — R0689 Other abnormalities of breathing: Secondary | ICD-10-CM | POA: Diagnosis not present

## 2018-08-12 LAB — BASIC METABOLIC PANEL
Anion gap: 8 (ref 5–15)
Anion gap: 9 (ref 5–15)
BUN: 18 mg/dL (ref 8–23)
BUN: 18 mg/dL (ref 8–23)
CO2: 39 mmol/L — ABNORMAL HIGH (ref 22–32)
CO2: 39 mmol/L — ABNORMAL HIGH (ref 22–32)
Calcium: 9.2 mg/dL (ref 8.9–10.3)
Calcium: 9.3 mg/dL (ref 8.9–10.3)
Chloride: 92 mmol/L — ABNORMAL LOW (ref 98–111)
Chloride: 92 mmol/L — ABNORMAL LOW (ref 98–111)
Creatinine, Ser: 0.77 mg/dL (ref 0.44–1.00)
Creatinine, Ser: 0.8 mg/dL (ref 0.44–1.00)
GFR calc Af Amer: >60 mL/min (ref >60)
GFR calc Af Amer: >60 mL/min (ref >60)
GFR calc non Af Amer: >60 mL/min (ref >60)
GFR calc non Af Amer: >60 mL/min (ref >60)
Glucose, Bld: 180 mg/dL — ABNORMAL HIGH (ref 70–99)
Glucose, Bld: 120 mg/dL — ABNORMAL HIGH (ref 70–99)
Potassium: 5.2 mmol/L — ABNORMAL HIGH (ref 3.5–5.1)
Potassium: 4.6 mmol/L (ref 3.5–5.1)
Sodium: 139 mmol/L (ref 135–145)
Sodium: 140 mmol/L (ref 135–145)

## 2018-08-12 LAB — GLUCOSE, CAPILLARY
GLUCOSE-CAPILLARY: 114 mg/dL — AB (ref 70–99)
GLUCOSE-CAPILLARY: 132 mg/dL — AB (ref 70–99)
Glucose-Capillary: 158 mg/dL — ABNORMAL HIGH (ref 70–99)
Glucose-Capillary: 164 mg/dL — ABNORMAL HIGH (ref 70–99)

## 2018-08-12 LAB — PROTIME-INR
INR: 0.96
Prothrombin Time: 12.7 seconds (ref 11.4–15.2)

## 2018-08-12 LAB — HIV ANTIBODY (ROUTINE TESTING W REFLEX): HIV Screen 4th Generation wRfx: NONREACTIVE

## 2018-08-12 LAB — HEPARIN LEVEL (UNFRACTIONATED): Heparin Unfractionated: 0.36 IU/mL (ref 0.30–0.70)

## 2018-08-12 LAB — TROPONIN I
Troponin I: 0.5 ng/mL (ref <0.03)
Troponin I: 0.5 ng/mL (ref <0.03)
Troponin I: 0.42 ng/mL (ref <0.03)

## 2018-08-12 LAB — MAGNESIUM: MAGNESIUM: 2.3 mg/dL (ref 1.7–2.4)

## 2018-08-12 SURGERY — LEFT HEART CATH AND CORONARY ANGIOGRAPHY
Anesthesia: LOCAL

## 2018-08-12 MED ORDER — ATORVASTATIN CALCIUM 40 MG PO TABS
40.0000 mg | ORAL_TABLET | Freq: Every day | ORAL | Status: DC
  Administered 2018-08-12 – 2018-08-19 (×7): 40 mg via ORAL
  Filled 2018-08-12 (×7): qty 1

## 2018-08-12 MED ORDER — SODIUM POLYSTYRENE SULFONATE 15 GM/60ML PO SUSP: 30.0000 g | Freq: Once | ORAL | Status: DC

## 2018-08-12 MED ORDER — ASPIRIN 81 MG PO CHEW
81.0000 mg | CHEWABLE_TABLET | ORAL | Status: AC
  Administered 2018-08-13: 81 mg via ORAL
  Filled 2018-08-12: qty 1

## 2018-08-12 MED ORDER — SODIUM CHLORIDE 0.9% FLUSH
3.0000 mL | Freq: Two times a day (BID) | INTRAVENOUS | Status: DC
  Administered 2018-08-12 – 2018-08-13 (×4): 3 mL via INTRAVENOUS

## 2018-08-12 MED ORDER — METOPROLOL TARTRATE 25 MG PO TABS
25.0000 mg | ORAL_TABLET | Freq: Two times a day (BID) | ORAL | Status: DC
  Administered 2018-08-12 – 2018-08-18 (×13): 25 mg via ORAL
  Filled 2018-08-12 (×13): qty 1

## 2018-08-12 MED ORDER — METHYLPREDNISOLONE SODIUM SUCC 125 MG IJ SOLR
60.0000 mg | Freq: Four times a day (QID) | INTRAMUSCULAR | Status: DC
  Administered 2018-08-12 – 2018-08-14 (×8): 60 mg via INTRAVENOUS
  Filled 2018-08-12 (×8): qty 2

## 2018-08-12 MED ORDER — SODIUM CHLORIDE 0.9 % IV SOLN: 250.0000 mL | INTRAVENOUS | Status: DC | PRN

## 2018-08-12 MED ORDER — SODIUM POLYSTYRENE SULFONATE 15 GM/60ML PO SUSP
30.0000 g | Freq: Once | ORAL | Status: DC
  Filled 2018-08-12 (×2): qty 120

## 2018-08-12 MED ORDER — SODIUM CHLORIDE 0.9% FLUSH: 3.0000 mL | INTRAVENOUS | Status: DC | PRN

## 2018-08-12 MED ORDER — SODIUM CHLORIDE 0.9 % IV SOLN: INTRAVENOUS | Status: DC

## 2018-08-12 MED ORDER — HEPARIN (PORCINE) IN NACL 100-0.45 UNIT/ML-% IJ SOLN
900.0000 [IU]/h | INTRAMUSCULAR | Status: DC
  Administered 2018-08-12 – 2018-08-13 (×2): 850 [IU]/h via INTRAVENOUS
  Filled 2018-08-12 (×4): qty 250

## 2018-08-12 MED ORDER — GUAIFENESIN ER 600 MG PO TB12
1200.0000 mg | ORAL_TABLET | Freq: Two times a day (BID) | ORAL | Status: DC
  Administered 2018-08-12 – 2018-08-15 (×6): 1200 mg via ORAL
  Filled 2018-08-12 (×8): qty 2

## 2018-08-12 MED ORDER — HEPARIN BOLUS VIA INFUSION
4000.0000 [IU] | Freq: Once | INTRAVENOUS | Status: AC
  Administered 2018-08-12: 4000 [IU] via INTRAVENOUS
  Filled 2018-08-12: qty 4000

## 2018-08-12 NOTE — Progress Notes (Signed)
Refused to go to [facility] cath lab today due to saying she wasn't prepared or given enough notice of plan. Texted Dr. Wynetta Emery and called Dr. Everardo Beals number and spoke with staff and they will give him message.

## 2018-08-12 NOTE — Progress Notes (Signed)
Has been denying chest pain this afternoon and says she feels better.  Sister called and talked to patient and has talked her into having catherization.  Contacted Dr.Johnson by epage to let him know that she wishes to have procedure now

## 2018-08-12 NOTE — Consult Note (Signed)
Consult requested by: [facility] hospitalist, Dr. Wynetta Emery Consult requested for: COPD exacerbation  HPI: This is a 62 year old who was admitted to the hospital on August 10, 2018 with COPD exacerbation.  She has been very short of breath.  She is been coughing and congested.  She says is been worse for about the last 2 weeks.  She has not had any fever or chills but does complain of fatigue and has been coughing up some white sputum.  She has significant wheezing.  She is had problems with reflux and complains of nausea but she is not having any vomiting or abdominal pain.  She complains of chest tightness when I talked to her and puts her hand across her sternum.  No frank chest pain.  No urinary symptoms.  No neurological symptoms .  She is very anxious.  She tells me several times during the examination that "something has to be done" but she also tells me several times during the examination that she wants to go home. Past Medical History:  Diagnosis Date  . 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 Arthritis   . Chronic respiratory failure ([place]) On home 02 2-3L  . Compression fracture of lumbar vertebra ([place])   . COPD (chronic obstructive pulmonary disease) ([place])   . Hyperglycemia, drug-induced   . Hyperlipidemia   . Kidney stones 2014   Left side, multiple  . Osteoporosis 2013  . Shortness of breath   . Thyroid disease    pt denies  . Tobacco abuse      Family History  Problem Relation Age of Onset  . Heart disease Mother   . Hypertension Sister   . Hypertension Brother      Social History   Socioeconomic History  . Marital status: Divorced    Spouse name: Not on file  . Number of children: 3  . Years of education: Not on file  . Highest education level: Not on file  Occupational History  . Occupation: disabled    Employer: DISABLED  Social Needs  . Financial resource strain: Not on file  . Food insecurity:    Worry: Not on file    Inability: Not on file  . Transportation needs:     Medical: Not on file    Non-medical: Not on file  Tobacco Use  . Smoking status: Current Every Day Smoker    Packs/day: 1.00    Years: 25.00    Pack years: 25.00    Types: Cigarettes  . Smokeless tobacco: Never Used  Substance and Sexual Activity  . Alcohol use: No  . Drug use: No  . Sexual activity: Not Currently    Birth control/protection: Surgical  Lifestyle  . Physical activity:    Days per week: Not on file    Minutes per session: Not on file  . Stress: Not on file  Relationships  . Social connections:    Talks on phone: Not on file    Gets together: Not on file    Attends religious service: Not on file    Active member of club or organization: Not on file    Attends meetings of clubs or organizations: Not on file    Relationship status: Not on file  Other Topics Concern  . Not on file  Social History Narrative  . Not on file     ROS: Except as mentioned 10 point review of systems is negative    Objective: Vital signs in last 24 hours: Temp:  [  97.4 F (36.3 C)-98.4 F (36.9 C)] 97.7 F (36.5 C) (08/15 0835) Pulse Rate:  [103-113] 110 (08/15 0835) Resp:  [18-20] 20 (08/15 0835) BP: (127-153)/(90-100) 138/100 (08/15 0835) SpO2:  [89 %-100 %] 96 % (08/15 1445) Weight:  [89.9 kg] 89.9 kg (08/15 0547) Weight change: 0.136 kg Last BM Date: 08/12/18  Intake/Output from previous day: 08/14 0701 - 08/15 0700 In: 720 [P.O.:720] Out: -   PHYSICAL EXAM Constitutional: She is awake and alert.  She is diaphoretic.  Eyes: Pupils react EOMI.  Ears nose mouth and throat: Mucous membranes are moist.  Hearing is grossly normal.  Her throat is clear.  Cardiovascular: Her heart is regular with normal heart sounds.  Respiratory: She has increased respiratory effort and has bilateral wheezing.  Gastrointestinal: Her abdomen is soft with no masses.  She is obese.  Skin: Diaphoresis is seen in her skin is somewhat clammy.  Neurological: No focal abnormalities.   Musculoskeletal: Grossly normal strength in the upper and lower extremities bilaterally.  Psychiatric: She is anxious  Lab Results: Basic Metabolic Panel: Recent Labs    08/10/18 1734  08/12/18 0438 08/12/18 1143  NA 142   < > 139 140  K 3.4*   < > 5.2* 4.6  CL 92*   < > 92* 92*  CO2 42*   < > 39* 39*  GLUCOSE 179*   < > 180* 120*  BUN 7*   < > 18 18  CREATININE 0.81   < > 0.77 0.80  CALCIUM 9.1   < > 9.2 9.3  MG 2.0  --  2.3  --   PHOS 2.9  --   --   --    < > = values in this interval not displayed.   Liver Function Tests: Recent Labs    08/11/18 0458  AST 35  ALT 35  ALKPHOS 55  BILITOT 0.5  PROT 6.4*  ALBUMIN 3.3*   No results for input(s): LIPASE, AMYLASE in the last 72 hours. No results for input(s): AMMONIA in the last 72 hours. CBC: Recent Labs    08/10/18 1734 08/11/18 0458  WBC 16.4* 17.2*  NEUTROABS 12.9 15.7*  HGB 13.1 12.2  HCT 42.8 40.5  MCV 100.5* 100.2*  PLT 269 290   Cardiac Enzymes: Recent Labs    08/12/18 0942  TROPONINI 0.50*   BNP: No results for input(s): PROBNP in the last 72 hours. D-Dimer: No results for input(s): DDIMER in the last 72 hours. CBG: Recent Labs    08/11/18 1548 08/11/18 2248 08/12/18 0740 08/12/18 1141  GLUCAP 191* 158* 158* 114*   Hemoglobin A1C: Recent Labs    08/11/18 0458  HGBA1C 6.9*   Fasting Lipid Panel: No results for input(s): CHOL, HDL, LDLCALC, TRIG, CHOLHDL, LDLDIRECT in the last 72 hours. Thyroid Function Tests: No results for input(s): TSH, T4TOTAL, FREET4, T3FREE, THYROIDAB in the last 72 hours. Anemia Panel: No results for input(s): VITAMINB12, FOLATE, FERRITIN, TIBC, IRON, RETICCTPCT in the last 72 hours. Coagulation: Recent Labs    08/12/18 1143  LABPROT 12.7  INR 0.96   Urine Drug Screen: Drugs of Abuse  No results found for: LABOPIA, COCAINSCRNUR, LABBENZ, AMPHETMU, THCU, LABBARB  Alcohol Level: No results for input(s): ETH in the last 72 hours. Urinalysis: No results  for input(s): COLORURINE, LABSPEC, PHURINE, GLUCOSEU, HGBUR, BILIRUBINUR, KETONESUR, PROTEINUR, UROBILINOGEN, NITRITE, LEUKOCYTESUR in the last 72 hours.  Invalid input(s): APPERANCEUR Misc. Labs:   ABGS: No results for input(s): PHART, PO2ART, TCO2, HCO3 in  the last 72 hours.  Invalid input(s): PCO2   MICROBIOLOGY: No results found for this or any previous visit (from the past 240 hour(s)).  Studies/Results: US Venous Img Lower Bilateral  Result Date: 08/11/2018 CLINICAL DATA:  Bilateral lower extremity edema. History of left-sided Baker cyst. Evaluate for DVT. EXAM: BILATERAL LOWER EXTREMITY VENOUS DOPPLER ULTRASOUND TECHNIQUE: Gray-scale sonography with graded compression, as well as color Doppler and duplex ultrasound were performed to evaluate the lower extremity deep venous systems from the level of the common femoral vein and including the common femoral, femoral, profunda femoral, popliteal and calf veins including the posterior tibial, peroneal and gastrocnemius veins when visible. The superficial great saphenous vein was also interrogated. Spectral Doppler was utilized to evaluate flow at rest and with distal augmentation maneuvers in the common femoral, femoral and popliteal veins. COMPARISON:  Left lower extremity venous Doppler ultrasound-05/25/2018 FINDINGS: RIGHT LOWER EXTREMITY Common Femoral Vein: No evidence of thrombus. Normal compressibility, respiratory phasicity and response to augmentation. Saphenofemoral Junction: No evidence of thrombus. Normal compressibility and flow on color Doppler imaging. Profunda Femoral Vein: No evidence of thrombus. Normal compressibility and flow on color Doppler imaging. Femoral Vein: No evidence of thrombus. Normal compressibility, respiratory phasicity and response to augmentation. Popliteal Vein: No evidence of thrombus. Normal compressibility, respiratory phasicity and response to augmentation. Calf Veins: No evidence of thrombus. Normal  compressibility and flow on color Doppler imaging. Superficial Great Saphenous Vein: No evidence of thrombus. Normal compressibility. Venous Reflux:  None. Other Findings:  None. LEFT LOWER EXTREMITY Common Femoral Vein: No evidence of thrombus. Normal compressibility, respiratory phasicity and response to augmentation. Saphenofemoral Junction: No evidence of thrombus. Normal compressibility and flow on color Doppler imaging. Profunda Femoral Vein: No evidence of thrombus. Normal compressibility and flow on color Doppler imaging. Femoral Vein: No evidence of thrombus. Normal compressibility, respiratory phasicity and response to augmentation. Popliteal Vein: No evidence of thrombus. Normal compressibility, respiratory phasicity and response to augmentation. Calf Veins: No evidence of thrombus. Normal compressibility and flow on color Doppler imaging. Superficial Great Saphenous Vein: No evidence of thrombus. Normal compressibility. Venous Reflux:  None. Other Findings: There is a serpiginous approximately 5.7 x 6.2 x 2.7 cm fluid collection within the left popliteal fossa compatible with a Baker cyst, grossly unchanged compared to the 04/2018 examination, previously, 5.9 cm IMPRESSION: 1. No evidence of DVT within either lower extremity. 2. 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 Electronically Signed   By: Sandi Mariscal M.D.   On: 08/11/2018 11:12   Dg Chest Port 1 View  Result Date: 08/10/2018 CLINICAL DATA:  Shortness of breath EXAM: PORTABLE CHEST 1 VIEW COMPARISON:  05/25/2018, CT 06/04/2018 FINDINGS: Borderline to mild cardiomegaly. No acute airspace disease or pleural effusion. No pneumothorax. IMPRESSION: No active disease.  Borderline to mild cardiomegaly. Electronically Signed   By: Donavan Foil M.D.   On: 08/10/2018 17:58    Medications:  Prior to Admission:  Medications Prior to Admission  Medication Sig Dispense Refill Last Dose  . albuterol (PROVENTIL  HFA;VENTOLIN HFA) 108 (90 Base) MCG/ACT inhaler Inhale 2 puffs into the lungs every 6 (six) hours as needed for wheezing or shortness of breath. 1 Inhaler 5 08/10/2018 at Unknown time  . albuterol (PROVENTIL) (2.5 MG/3ML) 0.083% nebulizer solution INHALE 1 VIAL VIA NEBULIZER EVERY 6 HOURS AS NEEDED. (Patient taking differently: Take 2.5 mg by nebulization every 6 (six) hours as needed for wheezing or shortness of breath. ) 375 mL 0 08/10/2018 at Unknown time  .  alendronate (FOSAMAX) 70 MG tablet TAKE 1 TABLET BY MOUTH EVERY WEEK ON AN EMPTY STOMACH. (Patient taking differently: Take 70 mg by mouth every Wednesday. TAKE 1 TABLET BY MOUTH EVERY WEEK ON AN EMPTY STOMACH.) 4 tablet 0 08/04/2018 at Unknown time  . aspirin EC 81 MG tablet Take 81 mg by mouth every morning.    08/10/2018 at Unknown time  . budesonide-formoterol (SYMBICORT) 160-4.5 MCG/ACT inhaler Inhale 2 puffs into the lungs 2 (two) times daily. 10.2 g 11 08/10/2018 at Unknown time  . Calcium Carb-Cholecalciferol 600-800 MG-UNIT TABS Take 1 tablet by mouth 2 (two) times daily.    08/10/2018 at Unknown time  . diphenhydrAMINE (BENADRYL) 12.5 MG/5ML liquid Take 6.25 mg by mouth 4 (four) times daily as needed.   Past Week at Unknown time  . furosemide (LASIX) 40 MG tablet Take 1 tablet (40 mg total) by mouth daily. (Patient taking differently: Take 40-80 mg by mouth See admin instructions. Alternate taking 80mg  one day, then take 40mg  the next day, then repeat.) 90 tablet 3 08/10/2018 at Unknown time  . nystatin (MYCOSTATIN) 100000 UNIT/ML suspension Use as directed 5 mLs (500,000 Units total) in the mouth or throat 4 (four) times daily. 240 mL 3 Past Week at Unknown time  . oxyCODONE-acetaminophen (PERCOCET/ROXICET) 5-325 MG tablet take 1 tablet by mouth every 8 hours if needed for pain (Patient taking differently: Take 1 tablet by mouth 2 (two) times daily. ) 30 tablet 0 08/10/2018 at Unknown time  . OXYGEN Inhale 3 L into the lungs continuous.     08/10/2018 at Unknown time  . pantoprazole (PROTONIX) 40 MG tablet Take 1 tablet (40 mg total) by mouth 2 (two) times daily. 180 tablet 2 08/10/2018 at Unknown time  . polyethylene glycol (MIRALAX / GLYCOLAX) packet Take 17 g by mouth daily. 14 each 0 08/09/2018 at Unknown time  . predniSONE (DELTASONE) 10 MG tablet TAKE 2 TABLETS BY MOUTH EVERY MORNING WITH FOOD (Patient taking differently: Take 10 mg by mouth daily with breakfast. ) 60 tablet 5 08/10/2018 at Unknown time  . simvastatin (ZOCOR) 40 MG tablet Take 1 tablet (40 mg total) by mouth at bedtime. 90 tablet 3 08/09/2018 at Unknown time  . Tiotropium Bromide Monohydrate (SPIRIVA RESPIMAT) 2.5 MCG/ACT AERS INHALE 2 PUFFS INTO THE LUNGS ONCE DAILY (Patient taking differently: Inhale 2 puffs into the lungs at bedtime. ) 4 g 11 08/09/2018 at Unknown time   Scheduled: . [START ON 08/13/2018] aspirin  81 mg Oral Pre-Cath  . aspirin EC  81 mg Oral q morning - 10a  . atorvastatin  40 mg Oral q1800  . calcium-vitamin D  1 tablet Oral BID  . furosemide  40 mg Oral QODAY  . furosemide  80 mg Oral QODAY  . guaiFENesin  1,200 mg Oral BID  . insulin aspart  0-20 Units Subcutaneous TID WC  . insulin aspart  0-5 Units Subcutaneous QHS  . insulin aspart  6 Units Subcutaneous TID WC  . ipratropium-albuterol  3 mL Nebulization Q6H  . methylPREDNISolone (SOLU-MEDROL) injection  60 mg Intravenous Q6H  . metoprolol tartrate  25 mg Oral BID  . mometasone-formoterol  2 puff Inhalation BID  . nicotine  21 mg Transdermal Daily  . pantoprazole  40 mg Oral BID  . pneumococcal 23 valent vaccine  0.5 mL Intramuscular Tomorrow-1000  . polyethylene glycol  17 g Oral Daily  . sodium chloride flush  3 mL Intravenous Q12H  . sodium polystyrene  30 g Oral  Once   Continuous: . sodium chloride    . heparin 850 Units/hr (08/12/18 1135)   BWG:YKZLDJ chloride, acetaminophen **OR** acetaminophen, albuterol, diphenhydrAMINE, hydrALAZINE, ondansetron **OR** ondansetron  (ZOFRAN) IV, oxyCODONE-acetaminophen, sodium chloride flush  Assesment: She was admitted with COPD with acute exacerbation and acute on chronic hypoxic respiratory failure.  Chest x-ray that I have personally reviewed does not show any infiltrate.  She is still smoking at least a half a package of cigarettes daily and I told her she needs to stop but she does not really show much interest in that.  She is now on maximum treatment including ICS/LABA, nebulizer treatments, oxygen, smoking cessation for now, Mucinex and flutter valve.  She complained of chest discomfort and diaphoresis when I saw her after she came back from the bathroom.  She is being treated for reflux and her chest discomfort could be from reflux but she does have multiple cardiac risk factors and shows wall motion abnormalities on her echocardiogram.  She says this is not pain.  It is however not responding to twice daily PPI and to supplemental antacid.  I called Dr. Wynetta Emery hospitalist attending and he agrees we should go ahead with troponins EKG which I have personally reviewed and does not show any acute changes and cardiology consultation.  She had indeterminate diastolic function on her echocardiogram but has been felt to have some element of diastolic heart failure. Principal Problem:   COPD with acute exacerbation ([place]) Active Problems:   Tobacco abuse   Hyperglycemia, drug-induced   Hyperlipidemia   GERD (gastroesophageal reflux disease)   Sinus tachycardia   Hypercarbia   Edema of left lower extremity   Hypokalemia   Abnormal EKG   Diastolic dysfunction   Acute on chronic respiratory failure ([place])    Plan: Continue treatments.  As mentioned she is on good therapy for her COPD and she is not responding very well.  Troponins EKG and cardiology consult have been ordered  Thanks for allowing me to see her with you   LOS: 0 days   Kinsie Belford L 08/12/2018, 3:07 PM

## 2018-08-12 NOTE — Progress Notes (Signed)
Pt states she is taking her home symbicort. Pt states a Dr Wynetta Emery told her she could use her home medicines whenever she needs them. Pt was encouraged to give her home meds to her nurse but she refused. Pt educated on the dangers of taking home meds without our knowledge. Pt states that she understands and will not do it anymore

## 2018-08-12 NOTE — Consult Note (Signed)
Full note to follow.  She has severe COPD.  She is still smoking.  She has hypertension and tobacco abuse.  She certainly has risk factors for cardiac disease and when I went in to see her she had just come back from the bathroom and was complaining of substernal chest pressure and was markedly diaphoretic.  She is being treated for reflux and this may be related to reflux but I discussed with hospitalist attending Dr. Wynetta Emery and plan is to go ahead and cycle enzymes check an EKG and ask for cardiology input as well.  I think she is on pretty much maximum treatment for her COPD at this time and the biggest thing that she can do to help herself is stop smoking.

## 2018-08-12 NOTE — Progress Notes (Addendum)
PROGRESS NOTE    Cindy [name]  GBT:[id number]  DOB: [birth year]  DOA: 08/10/2018 PCP: Alycia Rossetti, MD   Brief Admission Hx: Cindy [name] is a 62 y.o. female with medical history significant of left adrenal adenoma, osteoarthritis, history of lumbar compression fracture, hyperlipidemia, urolithiasis, drug-induced hyperglycemia, osteoporosis, hypothyroidism, COPD on home oxygen who is coming to the emergency department with complaints of progressively worse shortness of breath for the past week or so.   Pt was admitted for acute COPD exacerbation.   MDM/Assessment & Plan:   1. Acute COPD exacerbation - continue current treatments.  Follow clinically.  Encouraged smoking cessation.  Because she remains slow to progress am going to ask for pulmonary consult.  Continue current management.  Add a flutter valve.  I did not add Spiriva as patient requested because she has been getting scheduled duo nebs will defer to pulmonary opinion regarding that.  Continue Dulera as ordered. 2. RLE edema - Doppler US negative for DVT. 3. Essential hypertension-added metoprolol 25 mg twice daily with better blood pressure and heart rate control. 4. Tobacco abuse - counseled at bedside about smoking cessation and dangers.  Pt verbalized understanding.  5. Hyperglycemia - from steroids -globin A1c was 6.9%.  Follow and provide sliding scale coverage as needed. 6. Diastolic heart dysfunction-stable on furosemide.  Echocardiogram with EF of 55% with indeterminate diastolic function see full report below. 7. Hyperlipidemia-continue simvastatin daily. 8. Hypokalemia-oral replacement ordered and magnesium ordered. 9. NSTEMI - Pt developed acute chest pain and rising troponins. She was seen by cardiology and they have placed on IV heparin and transferring to [facility] for cardiac cath.  EKG no acute ischemic changes seen.     Echocardiogram 08/11/18 Study Conclusions - Left ventricle: The cavity size was  normal. Wall thickness was  increased in a pattern of mild LVH. The estimated ejection  fraction was 55%. There is hypokinesis of the  mid-apicalanteroseptal myocardium. Indeterminate diastolic function. - Aortic valve: Mildly to moderately calcified annulus. Trileaflet; mildly calcified leaflets. Mean gradient (S): 10 mm Hg. Peak  gradient (S): 20 mm Hg. Moderately sclerotic to mildly stenotic  aortic valve. VTI ratio of LVOT to aortic valve: 0.58. - Mitral valve: There was trivial regurgitation. - Right atrium: Central venous pressure (est): 3 mm Hg. - Atrial septum: No defect or patent foramen ovale was identified. - Tricuspid valve: There was trivial regurgitation. - Pulmonary arteries: Systolic pressure could not be accurately   estimated. - Pericardium, extracardiac: A prominent pericardial fat pad was  present.   DVT prophylaxis: Lovenox Code Status: Full Family Communication: Patient Disposition Plan: The patient is not medically ready for discharge today, still requiring IV steroids and respiratory support, obtain pulmonary consult today.  Subjective: Patient complains of coughing and wheezing, reports that she would like to resume her bedside albuterol inhaler and Spiriva.  She says that she is followed by an outpatient pulmonologist Dr. Lamonte Sakai.  Objective: Vitals:   08/12/18 0546 08/12/18 0547 08/12/18 0744 08/12/18 0754  BP: 129/90     Pulse: (!) 103     Resp: 20     Temp: (!) 97.4 F (36.3 C)     TempSrc: Oral     SpO2: 99%  (!) 89% 99%  Weight:  89.9 kg    Height:        Intake/Output Summary (Last 24 hours) at 08/12/2018 0757 Last data filed at 08/11/2018 1700 Gross per 24 hour  Intake 720 ml  Output -  Net 720 ml   Filed Weights   08/10/18 1653 08/11/18 0506 08/12/18 0547  Weight: 89.8 kg 90.3 kg 89.9 kg    REVIEW OF SYSTEMS  As per history otherwise all reviewed and reported negative  Exam:  General exam: Morbidly obese awake alert in no apparent  distress, cooperative, audible wheezing heard. Respiratory system: `Diffuse inspiratory/expiratory wheezes except that better air movement is heard.  No increased work of breathing. Cardiovascular system: S1 & S2 heard, tachycardic rate.  No JVD, murmurs, gallops.  Bilateral pedal edema right greater than left. Gastrointestinal system: Abdomen is nondistended, soft and nontender. Normal bowel sounds heard. Central nervous system: Alert and oriented. No focal neurological deficits. Extremities: Bilateral lower extremity edema right greater than left.  Data Reviewed: Basic Metabolic Panel: Recent Labs  Lab 08/10/18 1734 08/11/18 0458 08/12/18 0438  NA 142 141 139  K 3.4* 4.3 5.2*  CL 92* 90* 92*  CO2 42* 38* 39*  GLUCOSE 179* 220* 180*  BUN 7* 10 18  CREATININE 0.81 0.78 0.77  CALCIUM 9.1 9.3 9.2  MG 2.0  --  2.3  PHOS 2.9  --   --    Liver Function Tests: Recent Labs  Lab 08/11/18 0458  AST 35  ALT 35  ALKPHOS 55  BILITOT 0.5  PROT 6.4*  ALBUMIN 3.3*   No results for input(s): LIPASE, AMYLASE in the last 168 hours. No results for input(s): AMMONIA in the last 168 hours. CBC: Recent Labs  Lab 08/10/18 1734 08/11/18 0458  WBC 16.4* 17.2*  NEUTROABS 12.9 15.7*  HGB 13.1 12.2  HCT 42.8 40.5  MCV 100.5* 100.2*  PLT 269 290   Cardiac Enzymes: No results for input(s): CKTOTAL, CKMB, CKMBINDEX, TROPONINI in the last 168 hours. CBG (last 3)  Recent Labs    08/11/18 1548 08/11/18 2248 08/12/18 0740  GLUCAP 191* 158* 158*   No results found for this or any previous visit (from the past 240 hour(s)).   Studies: US Venous Img Lower Bilateral  Result Date: 08/11/2018 CLINICAL DATA:  Bilateral lower extremity edema. History of left-sided Baker cyst. Evaluate for DVT. EXAM: BILATERAL LOWER EXTREMITY VENOUS DOPPLER ULTRASOUND TECHNIQUE: Gray-scale sonography with graded compression, as well as color Doppler and duplex ultrasound were performed to evaluate the lower  extremity deep venous systems from the level of the common femoral vein and including the common femoral, femoral, profunda femoral, popliteal and calf veins including the posterior tibial, peroneal and gastrocnemius veins when visible. The superficial great saphenous vein was also interrogated. Spectral Doppler was utilized to evaluate flow at rest and with distal augmentation maneuvers in the common femoral, femoral and popliteal veins. COMPARISON:  Left lower extremity venous Doppler ultrasound-05/25/2018 FINDINGS: RIGHT LOWER EXTREMITY Common Femoral Vein: No evidence of thrombus. Normal compressibility, respiratory phasicity and response to augmentation. Saphenofemoral Junction: No evidence of thrombus. Normal compressibility and flow on color Doppler imaging. Profunda Femoral Vein: No evidence of thrombus. Normal compressibility and flow on color Doppler imaging. Femoral Vein: No evidence of thrombus. Normal compressibility, respiratory phasicity and response to augmentation. Popliteal Vein: No evidence of thrombus. Normal compressibility, respiratory phasicity and response to augmentation. Calf Veins: No evidence of thrombus. Normal compressibility and flow on color Doppler imaging. Superficial Great Saphenous Vein: No evidence of thrombus. Normal compressibility. Venous Reflux:  None. Other Findings:  None. LEFT LOWER EXTREMITY Common Femoral Vein: No evidence of thrombus. Normal compressibility, respiratory phasicity and response to augmentation. Saphenofemoral Junction: No evidence of thrombus. Normal compressibility and flow  on color Doppler imaging. Profunda Femoral Vein: No evidence of thrombus. Normal compressibility and flow on color Doppler imaging. Femoral Vein: No evidence of thrombus. Normal compressibility, respiratory phasicity and response to augmentation. Popliteal Vein: No evidence of thrombus. Normal compressibility, respiratory phasicity and response to augmentation. Calf Veins: No evidence  of thrombus. Normal compressibility and flow on color Doppler imaging. Superficial Great Saphenous Vein: No evidence of thrombus. Normal compressibility. Venous Reflux:  None. Other Findings: There is a serpiginous approximately 5.7 x 6.2 x 2.7 cm fluid collection within the left popliteal fossa compatible with a Baker cyst, grossly unchanged compared to the 04/2018 examination, previously, 5.9 cm IMPRESSION: 1. No evidence of DVT within either lower extremity. 2. 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 Electronically Signed   By: Sandi Mariscal M.D.   On: 08/11/2018 11:12   Dg Chest Port 1 View  Result Date: 08/10/2018 CLINICAL DATA:  Shortness of breath EXAM: PORTABLE CHEST 1 VIEW COMPARISON:  05/25/2018, CT 06/04/2018 FINDINGS: Borderline to mild cardiomegaly. No acute airspace disease or pleural effusion. No pneumothorax. IMPRESSION: No active disease.  Borderline to mild cardiomegaly. Electronically Signed   By: Donavan Foil M.D.   On: 08/10/2018 17:58   Scheduled Meds: . aspirin EC  81 mg Oral q morning - 10a  . calcium-vitamin D  1 tablet Oral BID  . enoxaparin (LOVENOX) injection  40 mg Subcutaneous Q24H  . furosemide  40 mg Oral QODAY  . furosemide  80 mg Oral QODAY  . guaiFENesin  1,200 mg Oral BID  . insulin aspart  0-20 Units Subcutaneous TID WC  . insulin aspart  0-5 Units Subcutaneous QHS  . insulin aspart  6 Units Subcutaneous TID WC  . ipratropium-albuterol  3 mL Nebulization Q6H  . methylPREDNISolone (SOLU-MEDROL) injection  60 mg Intravenous Q6H  . metoprolol tartrate  25 mg Oral BID  . mometasone-formoterol  2 puff Inhalation BID  . nicotine  21 mg Transdermal Daily  . pantoprazole  40 mg Oral BID  . pneumococcal 23 valent vaccine  0.5 mL Intramuscular Tomorrow-1000  . polyethylene glycol  17 g Oral Daily  . simvastatin  40 mg Oral QHS  . sodium polystyrene  30 g Oral Once   Continuous Infusions:  Principal Problem:   COPD with  acute exacerbation (HCC) Active Problems:   Tobacco abuse   Hyperglycemia, drug-induced   Hyperlipidemia   GERD (gastroesophageal reflux disease)   Sinus tachycardia   Hypercarbia   Edema of left lower extremity   Hypokalemia   Abnormal EKG   Diastolic dysfunction   Acute on chronic respiratory failure ([place])  Time spent:   Irwin Brakeman, MD, FAAFP [facility] Pager [phone]  If 7PM-7AM, please contact night-coverage [email or web address] Password [email or web address] 08/12/2018, 7:57 AM    LOS: 0 days

## 2018-08-12 NOTE — Consult Note (Addendum)
Cardiology Consultation:   Patient ID: Cindy [name]; [id number]; Dec 01, 1956   Admit date: 08/10/2018 Date of Consult: 08/12/2018  Primary Care Provider: Alycia Rossetti, MD Primary Cardiologist: No primary care provider on file.    Patient Profile:   Cindy [name] is a 62 y.o. female with a hx of COPD, hyperlipidemia, hypertension, and tobacco abuse who is being seen today for the evaluation of chest pain at the request of Dr. Wynetta Emery.  History of Present Illness:   Cindy [name] is a 62 year old woman with a history of COPD, hyperlipidemia, hypertension, T11 compression fracture and chronic pain, and tobacco abuse is currently hospitalized with an acute COPD exacerbation.  She continues to smoke.  She was being evaluated by pulmonology earlier this morning and apparently had just come back from the bathroom and was complaining of substernal chest pressure and was markedly diaphoretic.  Troponins and an ECG have been ordered which are pending.  She has no known coronary artery disease.  I personally reviewed the echocardiogram performed yesterday which demonstrated normal left ventricular systolic function, LVEF 40%, mild LVH, hypokinesis of the mid apical anteroseptal myocardium, indeterminate diastolic function, calcified aortic annulus and calcified leaflets with moderately sclerotic to mildly stenotic aortic valve.  I personally reviewed the ECG performed on 08/10/2018 which showed sinus tachycardia, 116 bpm.  Chest x-ray 08/10/2018 showed no active disease and borderline to mild cardiomegaly.  Lower extremity Dopplers performed yesterday showed no evidence of DVT with a 5.7 cm minimally complex left sided Baker's cyst.  I also reviewed a chest CT performed on 06/04/2018 which showed coronary, aortic arch, and branch vessel atherosclerotic vascular disease.  She denies exertional chest pain at home.  She describes her current symptoms as "acid indigestion ".  She has chest discomfort  and throat discomfort.  She has some associated nausea but no vomiting.  She denies dysphagia for solids.  She also told me she wants to go home.  She has been smoking a pack of cigarettes daily for several decades.  She also complains of bilateral leg swelling.  Family history: Younger sister underwent CABG.  Past Medical History:  Diagnosis Date  . 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 Arthritis   . Chronic respiratory failure ([place]) On home 02 2-3L  . Compression fracture of lumbar vertebra ([place])   . COPD (chronic obstructive pulmonary disease) ([place])   . Hyperglycemia, drug-induced   . Hyperlipidemia   . Kidney stones 2014   Left side, multiple  . Osteoporosis 2013  . Shortness of breath   . Thyroid disease    pt denies  . Tobacco abuse     Past Surgical History:  Procedure Laterality Date  . CERVICAL BIOPSY    . COLONOSCOPY N/A 03/31/2013   Procedure: COLONOSCOPY;  Surgeon: Rogene Houston, MD;  Location: [facility];  Service: Endoscopy;  Laterality: N/A;  730  . TUBAL LIGATION         Inpatient Medications: Scheduled Meds: . aspirin EC  81 mg Oral q morning - 10a  . calcium-vitamin D  1 tablet Oral BID  . enoxaparin (LOVENOX) injection  40 mg Subcutaneous Q24H  . furosemide  40 mg Oral QODAY  . furosemide  80 mg Oral QODAY  . guaiFENesin  1,200 mg Oral BID  . insulin aspart  0-20 Units Subcutaneous TID WC  . insulin aspart  0-5 Units Subcutaneous QHS  . insulin aspart  6 Units Subcutaneous TID WC  . ipratropium-albuterol  3  mL Nebulization Q6H  . methylPREDNISolone (SOLU-MEDROL) injection  60 mg Intravenous Q6H  . metoprolol tartrate  25 mg Oral BID  . mometasone-formoterol  2 puff Inhalation BID  . nicotine  21 mg Transdermal Daily  . pantoprazole  40 mg Oral BID  . pneumococcal 23 valent vaccine  0.5 mL Intramuscular Tomorrow-1000  . polyethylene glycol  17 g Oral Daily  . simvastatin  40 mg Oral QHS  . sodium polystyrene  30 g Oral Once   Continuous  Infusions:  PRN Meds: acetaminophen **OR** acetaminophen, albuterol, diphenhydrAMINE, hydrALAZINE, ondansetron **OR** ondansetron (ZOFRAN) IV, oxyCODONE-acetaminophen  Allergies:    Allergies  Allergen Reactions  . Keflex [Cephalexin] Anaphylaxis and Rash  . Doxycycline Other (See Comments)    Gave patient oral thrush  . Sulfa Antibiotics   . Avelox [Moxifloxacin Hcl In Nacl] Itching and Rash  . Toradol [Ketorolac Tromethamine] Swelling, Rash and Other (See Comments)    Bruising, swelling, rash at injection site    Social History:   Social History   Socioeconomic History  . Marital status: Divorced    Spouse name: Not on file  . Number of children: 3  . Years of education: Not on file  . Highest education level: Not on file  Occupational History  . Occupation: disabled    Employer: DISABLED  Social Needs  . Financial resource strain: Not on file  . Food insecurity:    Worry: Not on file    Inability: Not on file  . Transportation needs:    Medical: Not on file    Non-medical: Not on file  Tobacco Use  . Smoking status: Current Every Day Smoker    Packs/day: 1.00    Years: 25.00    Pack years: 25.00    Types: Cigarettes  . Smokeless tobacco: Never Used  Substance and Sexual Activity  . Alcohol use: No  . Drug use: No  . Sexual activity: Not Currently    Birth control/protection: Surgical  Lifestyle  . Physical activity:    Days per week: Not on file    Minutes per session: Not on file  . Stress: Not on file  Relationships  . Social connections:    Talks on phone: Not on file    Gets together: Not on file    Attends religious service: Not on file    Active member of club or organization: Not on file    Attends meetings of clubs or organizations: Not on file    Relationship status: Not on file  . Intimate partner violence:    Fear of current or ex partner: Not on file    Emotionally abused: Not on file    Physically abused: Not on file    Forced sexual  activity: Not on file  Other Topics Concern  . Not on file  Social History Narrative  . Not on file    Family History:   Younger sister underwent CABG.  Family History  Problem Relation Age of Onset  . Heart disease Mother   . Hypertension Sister   . Hypertension Brother      ROS:  Please see the history of present illness.   All other ROS reviewed and negative.     Physical Exam/Data:   Vitals:   08/12/18 0547 08/12/18 0744 08/12/18 0754 08/12/18 0835  BP:    (!) 138/100  Pulse:    (!) 110  Resp:    20  Temp:    97.7 F (36.5 C)  TempSrc:    Oral  SpO2:  (!) 89% 99% 100%  Weight: 89.9 kg     Height:        Intake/Output Summary (Last 24 hours) at 08/12/2018 1000 Last data filed at 08/11/2018 1700 Gross per 24 hour  Intake 480 ml  Output -  Net 480 ml   Filed Weights   08/10/18 1653 08/11/18 0506 08/12/18 0547  Weight: 89.8 kg 90.3 kg 89.9 kg   Body mass index is 37.47 kg/m.  General:  Well nourished, well developed, in no acute distress, mildly tachypneic HEENT: normal Lymph: no adenopathy Neck: no JVD Endocrine:  No thryomegaly Cardiac:  normal S1, S2; RRR; no murmur  Lungs: Poor air movement with diffuse wheezes Abd: soft, nontender, no hepatomegaly  Ext: Dorsal pedal edema of left foot with trace pretibial edema Musculoskeletal:  No deformities, BUE and BLE strength normal and equal Skin: warm and dry  Neuro:  CNs 2-12 intact, no focal abnormalities noted Psych:  Normal affect   EKG:  The EKG was personally reviewed and demonstrates:  Reviewed above Telemetry:  Telemetry was personally reviewed and demonstrates:  Sinus tachycardia   Relevant CV Studies: Echocardiogram reviewed above  Laboratory Data:  Chemistry Recent Labs  Lab 08/10/18 1734 08/11/18 0458 08/12/18 0438  NA 142 141 139  K 3.4* 4.3 5.2*  CL 92* 90* 92*  CO2 42* 38* 39*  GLUCOSE 179* 220* 180*  BUN 7* 10 18  CREATININE 0.81 0.78 0.77  CALCIUM 9.1 9.3 9.2  GFRNONAA  >60 >60 >60  GFRAA >60 >60 >60  ANIONGAP 8 13 8     Recent Labs  Lab 08/11/18 0458  PROT 6.4*  ALBUMIN 3.3*  AST 35  ALT 35  ALKPHOS 55  BILITOT 0.5   Hematology Recent Labs  Lab 08/10/18 1734 08/11/18 0458  WBC 16.4* 17.2*  RBC 4.26 4.04  HGB 13.1 12.2  HCT 42.8 40.5  MCV 100.5* 100.2*  MCH 30.8 30.2  MCHC 30.6 30.1  RDW 14.3 14.4  PLT 269 290   Cardiac EnzymesNo results for input(s): TROPONINI in the last 168 hours.  Recent Labs  Lab 08/10/18 1804  TROPIPOC 0.01    BNP Recent Labs  Lab 08/10/18 1735  BNP 32.0    DDimer No results for input(s): DDIMER in the last 168 hours.  Radiology/Studies:  US Venous Img Lower Bilateral  Result Date: 08/11/2018 CLINICAL DATA:  Bilateral lower extremity edema. History of left-sided Baker cyst. Evaluate for DVT. EXAM: BILATERAL LOWER EXTREMITY VENOUS DOPPLER ULTRASOUND TECHNIQUE: Gray-scale sonography with graded compression, as well as color Doppler and duplex ultrasound were performed to evaluate the lower extremity deep venous systems from the level of the common femoral vein and including the common femoral, femoral, profunda femoral, popliteal and calf veins including the posterior tibial, peroneal and gastrocnemius veins when visible. The superficial great saphenous vein was also interrogated. Spectral Doppler was utilized to evaluate flow at rest and with distal augmentation maneuvers in the common femoral, femoral and popliteal veins. COMPARISON:  Left lower extremity venous Doppler ultrasound-05/25/2018 FINDINGS: RIGHT LOWER EXTREMITY Common Femoral Vein: No evidence of thrombus. Normal compressibility, respiratory phasicity and response to augmentation. Saphenofemoral Junction: No evidence of thrombus. Normal compressibility and flow on color Doppler imaging. Profunda Femoral Vein: No evidence of thrombus. Normal compressibility and flow on color Doppler imaging. Femoral Vein: No evidence of thrombus. Normal compressibility,  respiratory phasicity and response to augmentation. Popliteal Vein: No evidence of thrombus. Normal compressibility, respiratory phasicity and  response to augmentation. Calf Veins: No evidence of thrombus. Normal compressibility and flow on color Doppler imaging. Superficial Great Saphenous Vein: No evidence of thrombus. Normal compressibility. Venous Reflux:  None. Other Findings:  None. LEFT LOWER EXTREMITY Common Femoral Vein: No evidence of thrombus. Normal compressibility, respiratory phasicity and response to augmentation. Saphenofemoral Junction: No evidence of thrombus. Normal compressibility and flow on color Doppler imaging. Profunda Femoral Vein: No evidence of thrombus. Normal compressibility and flow on color Doppler imaging. Femoral Vein: No evidence of thrombus. Normal compressibility, respiratory phasicity and response to augmentation. Popliteal Vein: No evidence of thrombus. Normal compressibility, respiratory phasicity and response to augmentation. Calf Veins: No evidence of thrombus. Normal compressibility and flow on color Doppler imaging. Superficial Great Saphenous Vein: No evidence of thrombus. Normal compressibility. Venous Reflux:  None. Other Findings: There is a serpiginous approximately 5.7 x 6.2 x 2.7 cm fluid collection within the left popliteal fossa compatible with a Baker cyst, grossly unchanged compared to the 04/2018 examination, previously, 5.9 cm IMPRESSION: 1. No evidence of DVT within either lower extremity. 2. 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 Electronically Signed   By: Sandi Mariscal M.D.   On: 08/11/2018 11:12   Dg Chest Port 1 View  Result Date: 08/10/2018 CLINICAL DATA:  Shortness of breath EXAM: PORTABLE CHEST 1 VIEW COMPARISON:  05/25/2018, CT 06/04/2018 FINDINGS: Borderline to mild cardiomegaly. No acute airspace disease or pleural effusion. No pneumothorax. IMPRESSION: No active disease.  Borderline to mild cardiomegaly.  Electronically Signed   By: Donavan Foil M.D.   On: 08/10/2018 17:58    Assessment and Plan:   1.  Chest pain: Unclear etiology.  She describes acid reflux symptoms.  She takes Protonix 40 mg twice daily.  Troponins and a follow-up ECG have been ordered and are pending at this time.  She did have coronary atherosclerosis by chest CT in June 2019 as reviewed above and she certainly has several cardiovascular risk factors.  Left ventricular systolic function was normal but she did have a wall motion abnormality is also reviewed above.  She is on aspirin and metoprolol and simvastatin (I will switch to Lipitor 40 mg.  See discussion and #4).  I would not start IV heparin at this time but await troponins to see if they are abnormal.  If serial troponins are normal, I would consider an outpatient dobutamine Myoview.  If troponins become significantly positive, she would need coronary angiography.  2.  Acute COPD exacerbation: She has been evaluated by pulmonary and she appears to be on optimal medical therapy.  She needs tobacco cessation.  3.  Hypertension: Blood pressure today is improved as compared to yesterday.  Diastolic readings are still elevated.  She is on metoprolol.  4.  Hyperlipidemia: Currently on simvastatin.  Lipid panel 10/13/2017 showed total cholesterol 204, HDL 59, triglycerides 162, LDL 117.  As hemoglobin A1c is elevated (possibly due to steroids), I will switch simvastatin 40 mg to atorvastatin 40 mg.  5.  Bilateral leg edema: Currently on alternating doses of Lasix 40 and 80 mg.  6.  Tobacco abuse: She needs cessation.    For questions or updates, please contact [place] Please consult [email or web address] for contact info under Cardiology/STEMI.   Signed, Kate Sable, MD  08/12/2018 10:00 AM   Addendum: ECG showed sinus tachycardia.  Initial troponin is 0.5.  IV heparin has been started.  This appears to be consistent with a non-STEMI.  I will arrange for transfer  to [facility] for coronary angiography.  Renal function and hemoglobin are normal.  Risks and benefits of cardiac catheterization have been discussed with the patient.  These include bleeding, infection, kidney damage, stroke, heart attack, death.  The patient understands these risks and is willing to proceed.   Time spent: 45 minutes

## 2018-08-12 NOTE — Progress Notes (Signed)
08/12/2018 1:54 PM  I spoke with patient twice about the need to have her cardiac work up completed.  She has agreed to go to [facility] to have her cath done.  She asked that I speak with her brother Drucie Opitz [phone] and I was able to speak with him per patient's request.    Murvin Natal MD

## 2018-08-12 NOTE — Progress Notes (Addendum)
ANTICOAGULATION CONSULT NOTE - Initial Consult  Pharmacy Consult for Heparin Indication: chest pain/ACS  Allergies  Allergen Reactions  . Keflex [Cephalexin] Anaphylaxis and Rash  . Doxycycline Other (See Comments)    Gave patient oral thrush  . Sulfa Antibiotics   . Avelox [Moxifloxacin Hcl In Nacl] Itching and Rash  . Toradol [Ketorolac Tromethamine] Swelling, Rash and Other (See Comments)    Bruising, swelling, rash at injection site    Patient Measurements: Height: 5\' 1"  (154.9 cm) Weight: 198 lb 4.8 oz (89.9 kg) IBW/kg (Calculated) : 47.8 HEPARIN DW (KG): 68.8  Vital Signs: Temp: 97.7 F (36.5 C) (08/15 1507) Temp Source: Oral (08/15 1507) BP: 114/87 (08/15 1507) Pulse Rate: 100 (08/15 1507)  Labs: Recent Labs    08/10/18 1734 08/11/18 0458 08/12/18 0438 08/12/18 0942 08/12/18 1143 08/12/18 1555 08/12/18 1854  HGB 13.1 12.2  --   --   --   --   --   HCT 42.8 40.5  --   --   --   --   --   PLT 269 290  --   --   --   --   --   LABPROT  --   --   --   --  12.7  --   --   INR  --   --   --   --  0.96  --   --   HEPARINUNFRC  --   --   --   --   --   --  0.36  CREATININE 0.81 0.78 0.77  --  0.80  --   --   TROPONINI  --   --   --  0.50*  --  0.50*  --     Estimated Creatinine Clearance: 74.4 mL/min (by C-G formula based on SCr of 0.8 mg/dL).   Medical History: Past Medical History:  Diagnosis Date  . 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 Arthritis   . Chronic respiratory failure ([place]) On home 02 2-3L  . Compression fracture of lumbar vertebra ([place])   . COPD (chronic obstructive pulmonary disease) ([place])   . Hyperglycemia, drug-induced   . Hyperlipidemia   . Kidney stones 2014   Left side, multiple  . Osteoporosis 2013  . Shortness of breath   . Thyroid disease    pt denies  . Tobacco abuse     Medications:  Medications Prior to Admission  Medication Sig Dispense Refill Last Dose  . albuterol (PROVENTIL HFA;VENTOLIN HFA) 108 (90 Base) MCG/ACT inhaler  Inhale 2 puffs into the lungs every 6 (six) hours as needed for wheezing or shortness of breath. 1 Inhaler 5 08/10/2018 at Unknown time  . albuterol (PROVENTIL) (2.5 MG/3ML) 0.083% nebulizer solution INHALE 1 VIAL VIA NEBULIZER EVERY 6 HOURS AS NEEDED. (Patient taking differently: Take 2.5 mg by nebulization every 6 (six) hours as needed for wheezing or shortness of breath. ) 375 mL 0 08/10/2018 at Unknown time  . alendronate (FOSAMAX) 70 MG tablet TAKE 1 TABLET BY MOUTH EVERY WEEK ON AN EMPTY STOMACH. (Patient taking differently: Take 70 mg by mouth every Wednesday. TAKE 1 TABLET BY MOUTH EVERY WEEK ON AN EMPTY STOMACH.) 4 tablet 0 08/04/2018 at Unknown time  . aspirin EC 81 MG tablet Take 81 mg by mouth every morning.    08/10/2018 at Unknown time  . budesonide-formoterol (SYMBICORT) 160-4.5 MCG/ACT inhaler Inhale 2 puffs into the lungs 2 (two) times daily. 10.2 g 11 08/10/2018 at Unknown time  .  Calcium Carb-Cholecalciferol 600-800 MG-UNIT TABS Take 1 tablet by mouth 2 (two) times daily.    08/10/2018 at Unknown time  . diphenhydrAMINE (BENADRYL) 12.5 MG/5ML liquid Take 6.25 mg by mouth 4 (four) times daily as needed.   Past Week at Unknown time  . furosemide (LASIX) 40 MG tablet Take 1 tablet (40 mg total) by mouth daily. (Patient taking differently: Take 40-80 mg by mouth See admin instructions. Alternate taking 80mg  one day, then take 40mg  the next day, then repeat.) 90 tablet 3 08/10/2018 at Unknown time  . nystatin (MYCOSTATIN) 100000 UNIT/ML suspension Use as directed 5 mLs (500,000 Units total) in the mouth or throat 4 (four) times daily. 240 mL 3 Past Week at Unknown time  . oxyCODONE-acetaminophen (PERCOCET/ROXICET) 5-325 MG tablet take 1 tablet by mouth every 8 hours if needed for pain (Patient taking differently: Take 1 tablet by mouth 2 (two) times daily. ) 30 tablet 0 08/10/2018 at Unknown time  . OXYGEN Inhale 3 L into the lungs continuous.    08/10/2018 at Unknown time  . pantoprazole (PROTONIX)  40 MG tablet Take 1 tablet (40 mg total) by mouth 2 (two) times daily. 180 tablet 2 08/10/2018 at Unknown time  . polyethylene glycol (MIRALAX / GLYCOLAX) packet Take 17 g by mouth daily. 14 each 0 08/09/2018 at Unknown time  . predniSONE (DELTASONE) 10 MG tablet TAKE 2 TABLETS BY MOUTH EVERY MORNING WITH FOOD (Patient taking differently: Take 10 mg by mouth daily with breakfast. ) 60 tablet 5 08/10/2018 at Unknown time  . simvastatin (ZOCOR) 40 MG tablet Take 1 tablet (40 mg total) by mouth at bedtime. 90 tablet 3 08/09/2018 at Unknown time  . Tiotropium Bromide Monohydrate (SPIRIVA RESPIMAT) 2.5 MCG/ACT AERS INHALE 2 PUFFS INTO THE LUNGS ONCE DAILY (Patient taking differently: Inhale 2 puffs into the lungs at bedtime. ) 4 g 11 08/09/2018 at Unknown time    Assessment: 62 yo female with history of COPD, hyperlipidemia, hypertension, T11 compression fracture and chronic pain, and tobacco abuse is currently hospitalized with an acute COPD exacerbation.  She continues to smoke. She is now complaining of substernal chest pain and is diaphoretic.  Troponins and ECG ordered and troponin elevated. Pharmacy asked to start heparin.  Goal of Therapy:  Heparin level 0.3-0.7 units/ml Monitor platelets by anticoagulation protocol: Yes   Plan:  Give 4000 units bolus x 1 Start heparin infusion at 850 units/hr Check anti-Xa level in 6-8 hours and daily while on heparin Continue to monitor H&H and platelets  Isac Sarna, BS [place]ena [place], BCPS Clinical Pharmacist Pager [phone]  . PM Addendum: Heparin level at goal. No bleeding noted. Continue heparin infusion at 850 units/hr Check anti-Xa level  daily while on heparin Continue to monitor H&H and platelets Tx to [facility] for cardiac cath planned F/U AM labs.  Pricilla Larsson, [facility] 08/12/2018 8:27 PM

## 2018-08-13 ENCOUNTER — Inpatient Hospital Stay (HOSPITAL_COMMUNITY): Payer: Medicare Other

## 2018-08-13 DIAGNOSIS — R600 Localized edema: Secondary | ICD-10-CM

## 2018-08-13 DIAGNOSIS — M7989 Other specified soft tissue disorders: Secondary | ICD-10-CM

## 2018-08-13 LAB — GLUCOSE, CAPILLARY
GLUCOSE-CAPILLARY: 116 mg/dL — AB (ref 70–99)
Glucose-Capillary: 136 mg/dL — ABNORMAL HIGH (ref 70–99)
Glucose-Capillary: 175 mg/dL — ABNORMAL HIGH (ref 70–99)
Glucose-Capillary: 137 mg/dL — ABNORMAL HIGH (ref 70–99)

## 2018-08-13 LAB — HEPARIN LEVEL (UNFRACTIONATED): HEPARIN UNFRACTIONATED: 0.46 [IU]/mL (ref 0.30–0.70)

## 2018-08-13 LAB — CBC
HCT: 42.7 % (ref 36.0–46.0)
Hemoglobin: 13.1 g/dL (ref 12.0–15.0)
MCH: 30.5 pg (ref 26.0–34.0)
MCHC: 30.7 g/dL (ref 30.0–36.0)
MCV: 99.5 fL (ref 78.0–100.0)
PLATELETS: 276 10*3/uL (ref 150–400)
RBC: 4.29 MIL/uL (ref 3.87–5.11)
RDW: 14.7 % (ref 11.5–15.5)
WBC: 19.5 10*3/uL — ABNORMAL HIGH (ref 4.0–10.5)

## 2018-08-13 LAB — BLOOD GAS, ARTERIAL
Acid-Base Excess: 10.8 mmol/L — ABNORMAL HIGH (ref 0.0–2.0)
Bicarbonate: 36.1 mmol/L — ABNORMAL HIGH (ref 20.0–28.0)
Drawn by: 518061
O2 CONTENT: 3 L/min
O2 SAT: 94.8 %
PCO2 ART: 60.4 mmHg — AB (ref 32.0–48.0)
PO2 ART: 77.5 mmHg — AB (ref 83.0–108.0)
Patient temperature: 98.6
pH, Arterial: 7.394 (ref 7.350–7.450)

## 2018-08-13 LAB — BASIC METABOLIC PANEL
Anion gap: 10 (ref 5–15)
BUN: 22 mg/dL (ref 8–23)
CALCIUM: 9.3 mg/dL (ref 8.9–10.3)
CHLORIDE: 93 mmol/L — AB (ref 98–111)
CO2: 36 mmol/L — ABNORMAL HIGH (ref 22–32)
CREATININE: 0.85 mg/dL (ref 0.44–1.00)
GFR calc non Af Amer: >60 mL/min (ref >60)
GLUCOSE: 159 mg/dL — AB (ref 70–99)
Potassium: 4.5 mmol/L (ref 3.5–5.1)
Sodium: 139 mmol/L (ref 135–145)

## 2018-08-13 MED ORDER — IPRATROPIUM-ALBUTEROL 0.5-2.5 (3) MG/3ML IN SOLN
3.0000 mL | RESPIRATORY_TRACT | Status: DC
  Administered 2018-08-13 (×2): 3 mL via RESPIRATORY_TRACT
  Filled 2018-08-13 (×2): qty 3

## 2018-08-13 MED ORDER — FUROSEMIDE 10 MG/ML IJ SOLN: 40.0000 mg | INTRAMUSCULAR | Status: DC

## 2018-08-13 MED ORDER — LEVALBUTEROL HCL 0.63 MG/3ML IN NEBU
0.6300 mg | INHALATION_SOLUTION | Freq: Four times a day (QID) | RESPIRATORY_TRACT | Status: DC
  Administered 2018-08-13 – 2018-08-14 (×4): 0.63 mg via RESPIRATORY_TRACT
  Filled 2018-08-13 (×4): qty 3

## 2018-08-13 MED ORDER — AMOXICILLIN-POT CLAVULANATE 875-125 MG PO TABS
1.0000 | ORAL_TABLET | Freq: Two times a day (BID) | ORAL | Status: DC
  Administered 2018-08-13 – 2018-08-20 (×15): 1 via ORAL
  Filled 2018-08-13 (×16): qty 1

## 2018-08-13 MED ORDER — SODIUM CHLORIDE 0.9 % IV SOLN
INTRAVENOUS | Status: DC
  Administered 2018-08-13 – 2018-08-14 (×2): via INTRAVENOUS

## 2018-08-13 MED ORDER — LORAZEPAM 2 MG/ML IJ SOLN
0.5000 mg | INTRAMUSCULAR | Status: AC | PRN
  Administered 2018-08-13 – 2018-08-14 (×4): 1 mg via INTRAVENOUS
  Filled 2018-08-13 (×4): qty 1

## 2018-08-13 MED ORDER — LABETALOL HCL 5 MG/ML IV SOLN: 5.0000 mg | INTRAVENOUS | Status: DC | PRN

## 2018-08-13 MED ORDER — FUROSEMIDE 10 MG/ML IJ SOLN
30.0000 mg | Freq: Once | INTRAMUSCULAR | Status: AC
  Administered 2018-08-13: 30 mg via INTRAVENOUS
  Filled 2018-08-13: qty 4

## 2018-08-13 MED ORDER — FUROSEMIDE 10 MG/ML IJ SOLN
30.0000 mg | Freq: Once | INTRAMUSCULAR | Status: DC
Start: 2018-08-13 — End: 2018-08-13

## 2018-08-13 MED ORDER — HYDROCOD POLST-CPM POLST ER 10-8 MG/5ML PO SUER
5.0000 mL | Freq: Two times a day (BID) | ORAL | Status: AC | PRN
  Administered 2018-08-13 – 2018-08-14 (×2): 5 mL via ORAL
  Filled 2018-08-13 (×2): qty 5

## 2018-08-13 MED ORDER — OXYCODONE-ACETAMINOPHEN 5-325 MG PO TABS
1.0000 | ORAL_TABLET | Freq: Four times a day (QID) | ORAL | Status: DC | PRN
  Administered 2018-08-13 – 2018-08-21 (×13): 1 via ORAL
  Filled 2018-08-13 (×16): qty 1

## 2018-08-13 NOTE — Progress Notes (Signed)
Plan is for her to be transferred to [facility] today for cardiac catheterization.  I will sign off.  Thanks for allowing me to see her with you

## 2018-08-13 NOTE — Progress Notes (Signed)
PROGRESS NOTE  ZITLALY MALSON  URK:[id number]  DOB: [birth year]  DOA: 08/10/2018 PCP: Alycia Rossetti, MD   Brief Admission Hx: ALAIAH LUNDY is a 62 y.o. female with medical history significant of left adrenal adenoma, osteoarthritis, history of lumbar compression fracture, hyperlipidemia, urolithiasis, drug-induced hyperglycemia, osteoporosis, hypothyroidism, COPD on home oxygen who is coming to the emergency department with complaints of progressively worse shortness of breath for the past week or so.   Pt was admitted for acute COPD exacerbation.   MDM/Assessment & Plan:   1. Acute COPD exacerbation - continue current treatments.  Added augmentin 875 mg BID.  She is slow to progress.  Encouraged smoking cessation.  B  Added a flutter valve. Increase duonebs to every 4 hours. Continue Dulera as ordered. 2. RLE edema - Doppler US negative for DVT. 3. Essential hypertension-added metoprolol 25 mg twice daily with better blood pressure and heart rate control. 4. Tobacco abuse - counseled at bedside about smoking cessation and dangers.  Pt verbalized understanding.  She does not seem motivated to quit at this time.  Nicotine patch ordered.    5. Hyperglycemia - exacerbated by IV steroids -hemoglobin A1c was 6.9%.  Follow and provide sliding scale coverage as needed. 6. Diastolic heart dysfunction-stable on furosemide.  Echocardiogram with EF of 55% with indeterminate diastolic function see full report below. 7. Hyperlipidemia-continue simvastatin daily. 8. Hyperkalemia - treated with lasix and resolved now.  Follow.  9. NSTEMI - Pt developed acute chest pain and rising troponins. She was seen by cardiology and they have placed on IV heparin and transferring to [facility] for cardiac cath.  Pt refused to transfer to [facility] yesterday but is willing to go today.  She has been placed on the cath schedule for today.  I spoke with Dr. Bronson Ing.  EKG no acute ischemic changes seen.      Echocardiogram 08/11/18 Study Conclusions - Left ventricle: The cavity size was normal. Wall thickness was  increased in a pattern of mild LVH. The estimated ejection  fraction was 55%. There is hypokinesis of the  mid-apicalanteroseptal myocardium. Indeterminate diastolic function. - Aortic valve: Mildly to moderately calcified annulus. Trileaflet; mildly calcified leaflets. Mean gradient (S): 10 mm Hg. Peak  gradient (S): 20 mm Hg. Moderately sclerotic to mildly stenotic  aortic valve. VTI ratio of LVOT to aortic valve: 0.58. - Mitral valve: There was trivial regurgitation. - Right atrium: Central venous pressure (est): 3 mm Hg. - Atrial septum: No defect or patent foramen ovale was identified. - Tricuspid valve: There was trivial regurgitation. - Pulmonary arteries: Systolic pressure could not be accurately   estimated. - Pericardium, extracardiac: A prominent pericardial fat pad was  present.   DVT prophylaxis: IV heparin Code Status: Full Family Communication: Patient and her brother Disposition Plan: The patient is not medically ready for discharge today, still requiring IV medications and subspecialty care  Subjective: Continues to have productive cough and chest pain, She become diaphoretic with minimal ambulation.  She is reporting that she will allow transfer to [facility] for cath today.  She says that interval for neb treatments is too long.  She says that interval for pain medication is too long.    Objective: Vitals:   08/13/18 0522 08/13/18 0524 08/13/18 0802 08/13/18 0804  BP: (!) 154/121 (!) 153/90    Pulse: (!) 107 98    Resp: 18     Temp: 97.7 F (36.5 C)     TempSrc: Oral  SpO2: 90% (!) 72% 95% 95%  Weight:  98.3 kg    Height:        Intake/Output Summary (Last 24 hours) at 08/13/2018 1030 Last data filed at 08/13/2018 0858 Gross per 24 hour  Intake 776.38 ml  Output -  Net 776.38 ml   Filed Weights   08/11/18 0506 08/12/18 0547 08/13/18 0524  Weight:  90.3 kg 89.9 kg 98.3 kg    REVIEW OF SYSTEMS  As per history otherwise all reviewed and reported negative  Exam:  General exam: Morbidly obese awake alert in no apparent distress, cooperative, audible wheezing heard.  She speaks full sentences.  Respiratory system: Diffuse inspiratory/expiratory wheezes with rhonchi.  No increased work of breathing. Cardiovascular system: S1 & S2 heard, tachycardic rate.  No JVD, murmurs, gallops.  Bilateral pedal edema right greater than left. Gastrointestinal system: Abdomen is nondistended, soft and nontender. Normal bowel sounds heard. Central nervous system: Alert and oriented. No focal neurological deficits. Extremities: Bilateral lower extremity edema right greater than left.  Data Reviewed: Basic Metabolic Panel: Recent Labs  Lab 08/10/18 1734 08/11/18 0458 08/12/18 0438 08/12/18 1143 08/13/18 0423  NA 142 141 139 140 139  K 3.4* 4.3 5.2* 4.6 4.5  CL 92* 90* 92* 92* 93*  CO2 42* 38* 39* 39* 36*  GLUCOSE 179* 220* 180* 120* 159*  BUN 7* 10 18 18 22   CREATININE 0.81 0.78 0.77 0.80 0.85  CALCIUM 9.1 9.3 9.2 9.3 9.3  MG 2.0  --  2.3  --   --   PHOS 2.9  --   --   --   --    Liver Function Tests: Recent Labs  Lab 08/11/18 0458  AST 35  ALT 35  ALKPHOS 55  BILITOT 0.5  PROT 6.4*  ALBUMIN 3.3*   No results for input(s): LIPASE, AMYLASE in the last 168 hours. No results for input(s): AMMONIA in the last 168 hours. CBC: Recent Labs  Lab 08/10/18 1734 08/11/18 0458 08/13/18 0423  WBC 16.4* 17.2* 19.5*  NEUTROABS 12.9 15.7*  --   HGB 13.1 12.2 13.1  HCT 42.8 40.5 42.7  MCV 100.5* 100.2* 99.5  PLT 269 290 276   Cardiac Enzymes: Recent Labs  Lab 08/12/18 0942 08/12/18 1555 08/12/18 2125  TROPONINI 0.50* 0.50* 0.42*   CBG (last 3)  Recent Labs    08/12/18 1640 08/12/18 2127 08/13/18 0750  GLUCAP 132* 164* 136*   No results found for this or any previous visit (from the past 240 hour(s)).   Studies: US Venous  Img Lower Bilateral  Result Date: 08/11/2018 CLINICAL DATA:  Bilateral lower extremity edema. History of left-sided Baker cyst. Evaluate for DVT. EXAM: BILATERAL LOWER EXTREMITY VENOUS DOPPLER ULTRASOUND TECHNIQUE: Gray-scale sonography with graded compression, as well as color Doppler and duplex ultrasound were performed to evaluate the lower extremity deep venous systems from the level of the common femoral vein and including the common femoral, femoral, profunda femoral, popliteal and calf veins including the posterior tibial, peroneal and gastrocnemius veins when visible. The superficial great saphenous vein was also interrogated. Spectral Doppler was utilized to evaluate flow at rest and with distal augmentation maneuvers in the common femoral, femoral and popliteal veins. COMPARISON:  Left lower extremity venous Doppler ultrasound-05/25/2018 FINDINGS: RIGHT LOWER EXTREMITY Common Femoral Vein: No evidence of thrombus. Normal compressibility, respiratory phasicity and response to augmentation. Saphenofemoral Junction: No evidence of thrombus. Normal compressibility and flow on color Doppler imaging. Profunda Femoral Vein: No evidence of thrombus. Normal  compressibility and flow on color Doppler imaging. Femoral Vein: No evidence of thrombus. Normal compressibility, respiratory phasicity and response to augmentation. Popliteal Vein: No evidence of thrombus. Normal compressibility, respiratory phasicity and response to augmentation. Calf Veins: No evidence of thrombus. Normal compressibility and flow on color Doppler imaging. Superficial Great Saphenous Vein: No evidence of thrombus. Normal compressibility. Venous Reflux:  None. Other Findings:  None. LEFT LOWER EXTREMITY Common Femoral Vein: No evidence of thrombus. Normal compressibility, respiratory phasicity and response to augmentation. Saphenofemoral Junction: No evidence of thrombus. Normal compressibility and flow on color Doppler imaging. Profunda  Femoral Vein: No evidence of thrombus. Normal compressibility and flow on color Doppler imaging. Femoral Vein: No evidence of thrombus. Normal compressibility, respiratory phasicity and response to augmentation. Popliteal Vein: No evidence of thrombus. Normal compressibility, respiratory phasicity and response to augmentation. Calf Veins: No evidence of thrombus. Normal compressibility and flow on color Doppler imaging. Superficial Great Saphenous Vein: No evidence of thrombus. Normal compressibility. Venous Reflux:  None. Other Findings: There is a serpiginous approximately 5.7 x 6.2 x 2.7 cm fluid collection within the left popliteal fossa compatible with a Baker cyst, grossly unchanged compared to the 04/2018 examination, previously, 5.9 cm IMPRESSION: 1. No evidence of DVT within either lower extremity. 2. 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 Electronically Signed   By: Sandi Mariscal M.D.   On: 08/11/2018 11:12   Scheduled Meds: . amoxicillin-clavulanate  1 tablet Oral Q12H  . aspirin EC  81 mg Oral q morning - 10a  . atorvastatin  40 mg Oral q1800  . calcium-vitamin D  1 tablet Oral BID  . furosemide  40 mg Oral QODAY  . furosemide  80 mg Oral QODAY  . guaiFENesin  1,200 mg Oral BID  . insulin aspart  0-20 Units Subcutaneous TID WC  . insulin aspart  0-5 Units Subcutaneous QHS  . insulin aspart  6 Units Subcutaneous TID WC  . ipratropium-albuterol  3 mL Nebulization Q4H  . methylPREDNISolone (SOLU-MEDROL) injection  60 mg Intravenous Q6H  . metoprolol tartrate  25 mg Oral BID  . mometasone-formoterol  2 puff Inhalation BID  . nicotine  21 mg Transdermal Daily  . pantoprazole  40 mg Oral BID  . pneumococcal 23 valent vaccine  0.5 mL Intramuscular Tomorrow-1000  . polyethylene glycol  17 g Oral Daily  . sodium chloride flush  3 mL Intravenous Q12H  . sodium polystyrene  30 g Oral Once   Continuous Infusions: . sodium chloride    . sodium chloride     . heparin 850 Units/hr (08/12/18 1135)    Principal Problem:   COPD with acute exacerbation ([place]) Active Problems:   Tobacco abuse   Hyperglycemia, drug-induced   Hyperlipidemia   GERD (gastroesophageal reflux disease)   Sinus tachycardia   Hypercarbia   Edema of left lower extremity   Hypokalemia   Abnormal EKG   Diastolic dysfunction   Acute on chronic respiratory failure ([place])  Time spent:   Irwin Brakeman, MD, FAAFP [facility] Pager [phone]  If 7PM-7AM, please contact night-coverage [email or web address] Password [email or web address] 08/13/2018, 10:30 AM    LOS: 1 day

## 2018-08-13 NOTE — Progress Notes (Signed)
ANTICOAGULATION CONSULT NOTE  Pharmacy Consult for Heparin Indication: chest pain/ACS   Patient Measurements: Height: 5\' 1"  (154.9 cm) Weight: 216 lb 11.4 oz (98.3 kg) IBW/kg (Calculated) : 47.8 HEPARIN DW (KG): 68.8  Vital Signs: Temp: 97.7 F (36.5 C) (08/16 0522) Temp Source: Oral (08/16 0522) BP: 153/90 (08/16 0524) Pulse Rate: 98 (08/16 0524)  Labs: Recent Labs    08/10/18 1734 08/11/18 0458 08/12/18 0438 08/12/18 0942 08/12/18 1143 08/12/18 1555 08/12/18 1854 08/12/18 2125 08/13/18 0423  HGB 13.1 12.2  --   --   --   --   --   --  13.1  HCT 42.8 40.5  --   --   --   --   --   --  42.7  PLT 269 290  --   --   --   --   --   --  276  LABPROT  --   --   --   --  12.7  --   --   --   --   INR  --   --   --   --  0.96  --   --   --   --   HEPARINUNFRC  --   --   --   --   --   --  0.36  --  0.46  CREATININE 0.81 0.78 0.77  --  0.80  --   --   --  0.85  TROPONINI  --   --   --  0.50*  --  0.50*  --  0.42*  --     Estimated Creatinine Clearance: 73.7 mL/min (by C-G formula based on SCr of 0.85 mg/dL).    MeAssessment: 62 yo female with history of COPD, hyperlipidemia, hypertension, T11 compression fracture and chronic pain, and tobacco abuse is currently hospitalized with an acute COPD exacerbation.  She continues to smoke. She is now complaining of substernal chest pain and is diaphoretic.  Troponins and ECG ordered and troponin elevated. Pharmacy asked to start heparin.  Goal of Therapy:  Heparin level 0.3-0.7 units/ml Monitor platelets by anticoagulation protocol: Yes   Plan:   Heparin level: 0.46 (within goal range) Continue heparin infusion at 850 units/hr Check anti-Xa level  daily while on heparin Continue to monitor H&H and platelets Tx to [facility] for cardiac cath planned   Despina Pole, Pharm. D. Clinical Pharmacist 08/13/2018 10:24 AM

## 2018-08-13 NOTE — Progress Notes (Signed)
Progress Note  Patient Name: Cindy [name] Date of Encounter: 08/13/2018  Primary Cardiologist: No primary care provider on file.   Subjective   She refused transfer to [facility] yesterday.  She told me she had to speak with family members before proceeding.  She is now agreeable to go.  She continues to have chest pain and throat pain with exertion accompanied by diaphoresis.  Inpatient Medications    Scheduled Meds: . amoxicillin-clavulanate  1 tablet Oral Q12H  . aspirin EC  81 mg Oral q morning - 10a  . atorvastatin  40 mg Oral q1800  . calcium-vitamin D  1 tablet Oral BID  . furosemide  40 mg Oral QODAY  . furosemide  80 mg Oral QODAY  . guaiFENesin  1,200 mg Oral BID  . insulin aspart  0-20 Units Subcutaneous TID WC  . insulin aspart  0-5 Units Subcutaneous QHS  . insulin aspart  6 Units Subcutaneous TID WC  . ipratropium-albuterol  3 mL Nebulization Q4H  . methylPREDNISolone (SOLU-MEDROL) injection  60 mg Intravenous Q6H  . metoprolol tartrate  25 mg Oral BID  . mometasone-formoterol  2 puff Inhalation BID  . nicotine  21 mg Transdermal Daily  . pantoprazole  40 mg Oral BID  . pneumococcal 23 valent vaccine  0.5 mL Intramuscular Tomorrow-1000  . polyethylene glycol  17 g Oral Daily  . sodium chloride flush  3 mL Intravenous Q12H  . sodium polystyrene  30 g Oral Once   Continuous Infusions: . sodium chloride    . heparin 850 Units/hr (08/12/18 1135)   PRN Meds: sodium chloride, acetaminophen **OR** acetaminophen, albuterol, chlorpheniramine-HYDROcodone, diphenhydrAMINE, hydrALAZINE, ondansetron **OR** ondansetron (ZOFRAN) IV, oxyCODONE-acetaminophen, sodium chloride flush   Vital Signs    Vitals:   08/13/18 0522 08/13/18 0524 08/13/18 0802 08/13/18 0804  BP: (!) 154/121 (!) 153/90    Pulse: (!) 107 98    Resp: 18     Temp: 97.7 F (36.5 C)     TempSrc: Oral     SpO2: 90% (!) 72% 95% 95%  Weight:  98.3 kg    Height:        Intake/Output Summary (Last  24 hours) at 08/13/2018 0855 Last data filed at 08/12/2018 1900 Gross per 24 hour  Intake 776.38 ml  Output -  Net 776.38 ml   Filed Weights   08/11/18 0506 08/12/18 0547 08/13/18 0524  Weight: 90.3 kg 89.9 kg 98.3 kg    Telemetry    Sinus tachycardia- Personally Reviewed  ECG    No new tracings- Personally Reviewed  Physical Exam   GEN:  Mildly tachypneic.   Neck: No JVD Cardiac:  Tachycardic, regular rhythm, no murmurs, rubs, or gallops.  Respiratory:  Poor air movement with diffuse wheezing GI:  soft, nontender, non-distended  MS:  Dorsal pedal edema of left foot with trace pretibial edema; No deformity. Neuro:  Nonfocal  Psych: Normal affect   Labs    Chemistry Recent Labs  Lab 08/11/18 0458 08/12/18 0438 08/12/18 1143 08/13/18 0423  NA 141 139 140 139  K 4.3 5.2* 4.6 4.5  CL 90* 92* 92* 93*  CO2 38* 39* 39* 36*  GLUCOSE 220* 180* 120* 159*  BUN 10 18 18 22   CREATININE 0.78 0.77 0.80 0.85  CALCIUM 9.3 9.2 9.3 9.3  PROT 6.4*  --   --   --   ALBUMIN 3.3*  --   --   --   AST 35  --   --   --  ALT 35  --   --   --   ALKPHOS 55  --   --   --   BILITOT 0.5  --   --   --   GFRNONAA >60 >60 >60 >60  GFRAA >60 >60 >60 >60  ANIONGAP 13 8 9 10      Hematology Recent Labs  Lab 08/10/18 1734 08/11/18 0458 08/13/18 0423  WBC 16.4* 17.2* 19.5*  RBC 4.26 4.04 4.29  HGB 13.1 12.2 13.1  HCT 42.8 40.5 42.7  MCV 100.5* 100.2* 99.5  MCH 30.8 30.2 30.5  MCHC 30.6 30.1 30.7  RDW 14.3 14.4 14.7  PLT 269 290 276    Cardiac Enzymes Recent Labs  Lab 08/12/18 0942 08/12/18 1555 08/12/18 2125  TROPONINI 0.50* 0.50* 0.42*    Recent Labs  Lab 08/10/18 1804  TROPIPOC 0.01     BNP Recent Labs  Lab 08/10/18 1735  BNP 32.0     DDimer No results for input(s): DDIMER in the last 168 hours.   Radiology    US Venous Img Lower Bilateral  Result Date: 08/11/2018 CLINICAL DATA:  Bilateral lower extremity edema. History of left-sided Baker cyst. Evaluate  for DVT. EXAM: BILATERAL LOWER EXTREMITY VENOUS DOPPLER ULTRASOUND TECHNIQUE: Gray-scale sonography with graded compression, as well as color Doppler and duplex ultrasound were performed to evaluate the lower extremity deep venous systems from the level of the common femoral vein and including the common femoral, femoral, profunda femoral, popliteal and calf veins including the posterior tibial, peroneal and gastrocnemius veins when visible. The superficial great saphenous vein was also interrogated. Spectral Doppler was utilized to evaluate flow at rest and with distal augmentation maneuvers in the common femoral, femoral and popliteal veins. COMPARISON:  Left lower extremity venous Doppler ultrasound-05/25/2018 FINDINGS: RIGHT LOWER EXTREMITY Common Femoral Vein: No evidence of thrombus. Normal compressibility, respiratory phasicity and response to augmentation. Saphenofemoral Junction: No evidence of thrombus. Normal compressibility and flow on color Doppler imaging. Profunda Femoral Vein: No evidence of thrombus. Normal compressibility and flow on color Doppler imaging. Femoral Vein: No evidence of thrombus. Normal compressibility, respiratory phasicity and response to augmentation. Popliteal Vein: No evidence of thrombus. Normal compressibility, respiratory phasicity and response to augmentation. Calf Veins: No evidence of thrombus. Normal compressibility and flow on color Doppler imaging. Superficial Great Saphenous Vein: No evidence of thrombus. Normal compressibility. Venous Reflux:  None. Other Findings:  None. LEFT LOWER EXTREMITY Common Femoral Vein: No evidence of thrombus. Normal compressibility, respiratory phasicity and response to augmentation. Saphenofemoral Junction: No evidence of thrombus. Normal compressibility and flow on color Doppler imaging. Profunda Femoral Vein: No evidence of thrombus. Normal compressibility and flow on color Doppler imaging. Femoral Vein: No evidence of thrombus. Normal  compressibility, respiratory phasicity and response to augmentation. Popliteal Vein: No evidence of thrombus. Normal compressibility, respiratory phasicity and response to augmentation. Calf Veins: No evidence of thrombus. Normal compressibility and flow on color Doppler imaging. Superficial Great Saphenous Vein: No evidence of thrombus. Normal compressibility. Venous Reflux:  None. Other Findings: There is a serpiginous approximately 5.7 x 6.2 x 2.7 cm fluid collection within the left popliteal fossa compatible with a Baker cyst, grossly unchanged compared to the 04/2018 examination, previously, 5.9 cm IMPRESSION: 1. No evidence of DVT within either lower extremity. 2. 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 Electronically Signed   By: Sandi Mariscal M.D.   On: 08/11/2018 11:12    Cardiac Studies   Echocardiogram 08/11/2018:   Left ventricle: The  cavity size was normal. Wall thickness was   increased in a pattern of mild LVH. The estimated ejection   fraction was 55%. There is hypokinesis of the   mid-apicalanteroseptal myocardium. Indeterminate diastolic   function. - Aortic valve: Mildly to moderately calcified annulus. Trileaflet;   mildly calcified leaflets. Mean gradient (S): 10 mm Hg. Peak   gradient (S): 20 mm Hg. Moderately sclerotic to mildly stenotic   aortic valve. VTI ratio of LVOT to aortic valve: 0.58. - Mitral valve: There was trivial regurgitation. - Right atrium: Central venous pressure (est): 3 mm Hg. - Atrial septum: No defect or patent foramen ovale was identified. - Tricuspid valve: There was trivial regurgitation. - Pulmonary arteries: Systolic pressure could not be accurately   estimated. - Pericardium, extracardiac: A prominent pericardial fat pad was   present.  Patient Profile     62 y.o. female with a hx of COPD and currently being treated for a COPD exacerbation, hyperlipidemia, hypertension, T11 compression fracture and  chronic pain, and tobacco abuse who is being seen today for the evaluation of chest pain at the request of Dr. Wynetta Emery.  Assessment & Plan    1.  Non-STEMI: She continues to complain of chest pain with throat pain and diaphoresis with exertion when walking around the hospital room.  She refused transfer to [facility] for coronary angiography yesterday after had obtain consent, but she is agreeable today.  Troponins peaked at 0.5 and most recent value was 0.42.  Continue aspirin, heparin, atorvastatin, and metoprolol. She did have coronary atherosclerosis by chest CT in June 4270 and she certainly has several cardiovascular risk factors.  Left ventricular systolic function was normal but she did have a wall motion abnormality as reviewed above.    2.  Acute COPD exacerbation: She is on IV Solu-Medrol and Augmentin. She is also on Dulera and albuterol nebs.  3.  Hypertension: Blood pressure today is elevated.  She is on metoprolol. If this persists an ACEI/ARB can be added.  4.  Hyperlipidemia: Now on atorvastatin (I started 8/15).  Lipid panel 10/13/2017 showed total cholesterol 204, HDL 59, triglycerides 162, LDL 117.  5.  Bilateral leg edema: Currently on alternating doses of Lasix 40 and 80 mg.  6.  Tobacco abuse: She needs cessation.    For questions or updates, please contact [place] Please consult [email or web address] for contact info under Cardiology/STEMI.      Signed, Kate Sable, MD  08/13/2018, 8:55 AM

## 2018-08-13 NOTE — Progress Notes (Signed)
Pt placed on BIPAP per MD order d/t resp distress. Settings 10/5 with 40%. PT WOB has decreased tremendously since albuterol txs and placement of BIPAP. RT will continue to monitor.

## 2018-08-13 NOTE — Progress Notes (Signed)
RT note: RT called stat for patient respiratory distress. Patient in distress BLBS tight and wheezing through out not moving much air.RT gave 3 back to back albuterol 2.5mg  HHN treatment. BLB sound post treatment still dimissed but moving more air HR 113 RR 28.

## 2018-08-13 NOTE — Significant Event (Signed)
Rapid Response Event Note RRT called for increase WOB, wheezing, pt anxious Overview: Time Called: 1452 Arrival Time: 1505 Event Type: Respiratory  Initial Focused Assessment: On my arrival pt sitting upright in bed, using accessory muscles, receiving duoneb via aerosol mask, pt alert and oriented, Cards at bedside, pt was a transfer from [facility] for cardiac cath. RT at bedside placing pt on Bipap.  Interventions: BiPap Duoneb CXR Lasix 40 mg IVP ABG 7.3/60.4/77.5/36.1 Plan of Care (if not transferred):  Event Summary: Name of Physician Notified: Dr. Wyline Copas  at 1520  Name of Consulting Physician Notified: Cardiology (PTA RRT ) at    Outcome: Stayed in room and stabalized     [place], [place]

## 2018-08-13 NOTE — Progress Notes (Signed)
Report called to Montine Circle, Therapist, sports at [facility], patient going to [place] room 12.  Patient awaiting [facility] to transport to facility.

## 2018-08-14 LAB — GLUCOSE, CAPILLARY
GLUCOSE-CAPILLARY: 102 mg/dL — AB (ref 70–99)
Glucose-Capillary: 130 mg/dL — ABNORMAL HIGH (ref 70–99)
Glucose-Capillary: 104 mg/dL — ABNORMAL HIGH (ref 70–99)
Glucose-Capillary: 112 mg/dL — ABNORMAL HIGH (ref 70–99)

## 2018-08-14 LAB — CBC
HCT: 45.1 % (ref 36.0–46.0)
HEMOGLOBIN: 13.8 g/dL (ref 12.0–15.0)
MCH: 30.2 pg (ref 26.0–34.0)
MCHC: 30.6 g/dL (ref 30.0–36.0)
MCV: 98.7 fL (ref 78.0–100.0)
PLATELETS: 272 10*3/uL (ref 150–400)
RBC: 4.57 MIL/uL (ref 3.87–5.11)
RDW: 14.5 % (ref 11.5–15.5)
WBC: 17.9 10*3/uL — ABNORMAL HIGH (ref 4.0–10.5)

## 2018-08-14 LAB — BASIC METABOLIC PANEL
ANION GAP: 11 (ref 5–15)
BUN: 20 mg/dL (ref 8–23)
CALCIUM: 8.4 mg/dL — AB (ref 8.9–10.3)
CO2: 35 mmol/L — AB (ref 22–32)
CREATININE: 0.93 mg/dL (ref 0.44–1.00)
Chloride: 96 mmol/L — ABNORMAL LOW (ref 98–111)
GLUCOSE: 146 mg/dL — AB (ref 70–99)
Potassium: 4.2 mmol/L (ref 3.5–5.1)
Sodium: 142 mmol/L (ref 135–145)

## 2018-08-14 LAB — HEPARIN LEVEL (UNFRACTIONATED): HEPARIN UNFRACTIONATED: 0.46 [IU]/mL (ref 0.30–0.70)

## 2018-08-14 LAB — MAGNESIUM: MAGNESIUM: 2.2 mg/dL (ref 1.7–2.4)

## 2018-08-14 MED ORDER — IPRATROPIUM-ALBUTEROL 0.5-2.5 (3) MG/3ML IN SOLN
3.0000 mL | Freq: Four times a day (QID) | RESPIRATORY_TRACT | Status: DC
  Administered 2018-08-14 (×2): 3 mL via RESPIRATORY_TRACT
  Filled 2018-08-14 (×2): qty 3

## 2018-08-14 MED ORDER — METHYLPREDNISOLONE SODIUM SUCC 125 MG IJ SOLR
60.0000 mg | Freq: Two times a day (BID) | INTRAMUSCULAR | Status: DC
  Administered 2018-08-14 – 2018-08-15 (×2): 60 mg via INTRAVENOUS
  Administered 2018-08-15: 21:00:00 via INTRAVENOUS
  Administered 2018-08-16 – 2018-08-20 (×9): 60 mg via INTRAVENOUS
  Filled 2018-08-14 (×12): qty 2

## 2018-08-14 MED ORDER — ALUM & MAG HYDROXIDE-SIMETH 200-200-20 MG/5ML PO SUSP: 15.0000 mL | Freq: Four times a day (QID) | ORAL | Status: DC | PRN

## 2018-08-14 MED ORDER — FAMOTIDINE 20 MG PO TABS: 10.0000 mg | ORAL_TABLET | Freq: Two times a day (BID) | ORAL | Status: DC | PRN

## 2018-08-14 NOTE — Progress Notes (Signed)
Progress Note  Patient Name: Cindy [name] Date of Encounter: 08/14/2018  Primary Cardiologist: No primary care provider on file.   Subjective   She complains of chest and throat pain with exertion as well as a headache.  She continues to have shortness of breath.  She said her leg swelling is improving.  Inpatient Medications    Scheduled Meds: . amoxicillin-clavulanate  1 tablet Oral Q12H  . aspirin EC  81 mg Oral q morning - 10a  . atorvastatin  40 mg Oral q1800  . calcium-vitamin D  1 tablet Oral BID  . furosemide  40 mg Oral QODAY  . furosemide  80 mg Oral QODAY  . guaiFENesin  1,200 mg Oral BID  . insulin aspart  0-20 Units Subcutaneous TID WC  . insulin aspart  0-5 Units Subcutaneous QHS  . insulin aspart  6 Units Subcutaneous TID WC  . ipratropium-albuterol  3 mL Nebulization Q6H  . levalbuterol  0.63 mg Nebulization Q6H  . methylPREDNISolone (SOLU-MEDROL) injection  60 mg Intravenous Q12H  . metoprolol tartrate  25 mg Oral BID  . mometasone-formoterol  2 puff Inhalation BID  . nicotine  21 mg Transdermal Daily  . pantoprazole  40 mg Oral BID  . pneumococcal 23 valent vaccine  0.5 mL Intramuscular Tomorrow-1000  . polyethylene glycol  17 g Oral Daily  . sodium chloride flush  3 mL Intravenous Q12H   Continuous Infusions: . sodium chloride    . sodium chloride 75 mL/hr at 08/14/18 0130  . heparin 850 Units/hr (08/13/18 1125)   PRN Meds: sodium chloride, acetaminophen **OR** acetaminophen, albuterol, alum & mag hydroxide-simeth, diphenhydrAMINE, famotidine, hydrALAZINE, labetalol, LORazepam, ondansetron **OR** ondansetron (ZOFRAN) IV, oxyCODONE-acetaminophen, sodium chloride flush   Vital Signs    Vitals:   08/14/18 0300 08/14/18 0332 08/14/18 0542 08/14/18 1200  BP:   (!) 137/91 135/83  Pulse:  (!) 110 (!) 112 (!) 109  Resp:  (!) 27 18 16   Temp:   97.7 F (36.5 C)   TempSrc:   Oral   SpO2:  96% 92% 100%  Weight: 87.9 kg     Height:         Intake/Output Summary (Last 24 hours) at 08/14/2018 1402 Last data filed at 08/14/2018 0714 Gross per 24 hour  Intake 1005 ml  Output 1050 ml  Net -45 ml   Filed Weights   08/12/18 0547 08/13/18 0524 08/14/18 0300  Weight: 89.9 kg 98.3 kg 87.9 kg    Telemetry    Sinus tachycardia- Personally Reviewed  ECG    NA - Personally Reviewed  Physical Exam   GEN: No acute distress.   Neck: No JVD Cardiac:  Tachycardic, regular, no murmurs, rubs, or gallops.  Respiratory:  Poor air movement with diffuse wheezing GI: Soft, nontender, non-distended  MS:  Trivial dorsal pedal edema early; No deformity. Neuro:  Nonfocal  Psych: Normal affect   Labs    Chemistry Recent Labs  Lab 08/11/18 0458  08/12/18 1143 08/13/18 0423 08/14/18 0409  NA 141   < > 140 139 142  K 4.3   < > 4.6 4.5 4.2  CL 90*   < > 92* 93* 96*  CO2 38*   < > 39* 36* 35*  GLUCOSE 220*   < > 120* 159* 146*  BUN 10   < > 18 22 20   CREATININE 0.78   < > 0.80 0.85 0.93  CALCIUM 9.3   < > 9.3 9.3 8.4*  PROT  6.4*  --   --   --   --   ALBUMIN 3.3*  --   --   --   --   AST 35  --   --   --   --   ALT 35  --   --   --   --   ALKPHOS 55  --   --   --   --   BILITOT 0.5  --   --   --   --   GFRNONAA >60   < > >60 >60 >60  GFRAA >60   < > >60 >60 >60  ANIONGAP 13   < > 9 10 11    < > = values in this interval not displayed.     Hematology Recent Labs  Lab 08/11/18 0458 08/13/18 0423 08/14/18 0409  WBC 17.2* 19.5* 17.9*  RBC 4.04 4.29 4.57  HGB 12.2 13.1 13.8  HCT 40.5 42.7 45.1  MCV 100.2* 99.5 98.7  MCH 30.2 30.5 30.2  MCHC 30.1 30.7 30.6  RDW 14.4 14.7 14.5  PLT 290 276 272    Cardiac Enzymes Recent Labs  Lab 08/12/18 0942 08/12/18 1555 08/12/18 2125  TROPONINI 0.50* 0.50* 0.42*    Recent Labs  Lab 08/10/18 1804  TROPIPOC 0.01     BNP Recent Labs  Lab 08/10/18 1735  BNP 32.0     DDimer No results for input(s): DDIMER in the last 168 hours.   Radiology    Dg Chest Port 1  View  Result Date: 08/13/2018 CLINICAL DATA:  Respiratory distress. EXAM: PORTABLE CHEST 1 VIEW COMPARISON:  08/10/2018 FINDINGS: The heart size and mediastinal contours are within normal limits. Both lungs are clear. The visualized skeletal structures are unremarkable. IMPRESSION: No active disease. Electronically Signed   By: Kathreen Devoid   On: 08/13/2018 15:38    Cardiac Studies   Echocardiogram 08/11/2018:   Left ventricle: The cavity size was normal. Wall thickness was increased in a pattern of mild LVH. The estimated ejection fraction was 55%. There is hypokinesis of the mid-apicalanteroseptal myocardium. Indeterminate diastolic function. - Aortic valve: Mildly to moderately calcified annulus. Trileaflet; mildly calcified leaflets. Mean gradient (S): 10 mm Hg. Peak gradient (S): 20 mm Hg. Moderately sclerotic to mildly stenotic aortic valve. VTI ratio of LVOT to aortic valve: 0.58. - Mitral valve: There was trivial regurgitation. - Right atrium: Central venous pressure (est): 3 mm Hg. - Atrial septum: No defect or patent foramen ovale was identified. - Tricuspid valve: There was trivial regurgitation. - Pulmonary arteries: Systolic pressure could not be accurately estimated. - Pericardium, extracardiac: A prominent pericardial fat pad was present  Patient Profile     62 y.o. female with a hx ofCOPD and currently being treated for a COPD exacerbation, hyperlipidemia, hypertension, T11 compression fracture and chronic pain, and tobacco abuse who is being seen today for the evaluation ofchest painat the request of Dr. Wynetta Emery.  Assessment & Plan    1.  Non-STEMI: She continues to complain of chest pain and throat pain with exertion.  She has been transferred from [facility] to [facility].  Coronary angiography has now been scheduled for 08/16/2018. Troponins peaked at 0.5 and most recent value was 0.42.  Continue aspirin, heparin, atorvastatin, and  metoprolol. She did have coronary atherosclerosis by chest CT in June 6948 and she certainly has several cardiovascular risk factors. Left ventricular systolic function was normal but she did have a wall motion abnormality as reviewed above.  2.  Acute COPD exacerbation: She is on IV Solu-Medrol and Augmentin. She is also on Dulera and albuterol nebs.  3. Hypertension: Blood pressure today is normal. She is on metoprolol. If blood pressure increases, an ACEI/ARB can be added.  4. Hyperlipidemia: Now on atorvastatin (I started 8/15). Lipid panel 10/13/2017 showed total cholesterol 204, HDL 59, triglycerides 162, LDL 117.  5. Bilateral leg edema: Currently on alternating doses of Lasix 40 and 80 mg.  6. Tobacco abuse: She needs cessation.   For questions or updates, please contact [place] Please consult [email or web address] for contact info under Cardiology/STEMI.      Signed, Kate Sable, MD  08/14/2018, 2:02 PM

## 2018-08-14 NOTE — Progress Notes (Signed)
PROGRESS NOTE    Cindy [name]  BPZ:[id number] DOB: April 20, 1956 DOA: 08/10/2018 PCP: Alycia Rossetti, MD      Brief Narrative:  [name] is a 62 y.o. F with COPD on home O2, smoking, dCHF, diabetes who presents with progressive shortness of breath and leg swelling, initial troponin normal, admitted for COPD flare.  While in the hospital, noted to have exertional chest discomfort, troponins cycled and found to have NSTEMI and transferred to [facility] for LHC.      Assessment & Plan:  NSTEMI -Continue heparin per Cardiology -Continue aspirin, atorvastatin, metoprolol -Defer ACE inhibitor, Plavix to cardiology -Consult cardiology, appreciate expert cares  COPD exacerbation Chest x-ray clear still very wheezy. -Continue Solu-Medrol, decrease dose -Continue PPI -Continue nebulized bronchodilators, scheduled and as needed -Allergic to doxycycline, will avoid azith given NSTEMI, continue Augmentin -Continue home ICS/LABA  Acute on chronic diastolic CHF EF 24% this hospitalization. -Continue oral Lasix, alternating doses -Daily BMP -Continue strict ins and outs, daily weights  Diabetes Glucose is fairly well controlled -Continue mealtime insulin -Continue sliding scale  LE edema DVT study negative of both legs.  Smoking Cessation recommended, modalities discussed  GERD -Continue PPI -H2RA PRN    DVT prophylaxis: Not applicable, on heparin Code Status: Full code Family Communication: brother At the bedside MDM and disposition Plan: The below labs and imaging reports were reviewed and summarized above.    The patient was admitted with COPD flare and leg swelling, found to have NSTEMI.  Now transferred to [facility] for LHC, planeed for Monday.   Consultants:   Cardiology  Procedures:   Echo 8/14 LV EF: 55%  ------------------------------------------------------------------- History:   PMH:  Acquired from the patient and from the patient&'s chart.  Chronic  obstructive pulmonary disease.  PMH:  GERD. Tobacco abuse.  Risk factors:  Dyslipidemia.  ------------------------------------------------------------------- Study Conclusions  - Left ventricle: The cavity size was normal. Wall thickness was   increased in a pattern of mild LVH. The estimated ejection   fraction was 55%. There is hypokinesis of the   mid-apicalanteroseptal myocardium. Indeterminate diastolic   function. - Aortic valve: Mildly to moderately calcified annulus. Trileaflet;   mildly calcified leaflets. Mean gradient (S): 10 mm Hg. Peak   gradient (S): 20 mm Hg. Moderately sclerotic to mildly stenotic   aortic valve. VTI ratio of LVOT to aortic valve: 0.58. - Mitral valve: There was trivial regurgitation. - Right atrium: Central venous pressure (est): 3 mm Hg. - Atrial septum: No defect or patent foramen ovale was identified. - Tricuspid valve: There was trivial regurgitation. - Pulmonary arteries: Systolic pressure could not be accurately   estimated. - Pericardium, extracardiac: A prominent pericardial fat pad was   present.   DVT LE8/14 FINDINGS: RIGHT LOWER EXTREMITY  Common Femoral Vein: No evidence of thrombus. Normal compressibility, respiratory phasicity and response to augmentation.  Saphenofemoral Junction: No evidence of thrombus. Normal compressibility and flow on color Doppler imaging.  Profunda Femoral Vein: No evidence of thrombus. Normal compressibility and flow on color Doppler imaging.  Femoral Vein: No evidence of thrombus. Normal compressibility, respiratory phasicity and response to augmentation.  Popliteal Vein: No evidence of thrombus. Normal compressibility, respiratory phasicity and response to augmentation.  Calf Veins: No evidence of thrombus. Normal compressibility and flow on color Doppler imaging.  Superficial Great Saphenous Vein: No evidence of thrombus. Normal compressibility.  Venous Reflux:  None.  Other  Findings:  None.  LEFT LOWER EXTREMITY  Common Femoral Vein: No evidence of thrombus. Normal  compressibility, respiratory phasicity and response to augmentation.  Saphenofemoral Junction: No evidence of thrombus. Normal compressibility and flow on color Doppler imaging.  Profunda Femoral Vein: No evidence of thrombus. Normal compressibility and flow on color Doppler imaging.  Femoral Vein: No evidence of thrombus. Normal compressibility, respiratory phasicity and response to augmentation.  Popliteal Vein: No evidence of thrombus. Normal compressibility, respiratory phasicity and response to augmentation.  Calf Veins: No evidence of thrombus. Normal compressibility and flow on color Doppler imaging.  Superficial Great Saphenous Vein: No evidence of thrombus. Normal compressibility.  Venous Reflux:  None.  Other Findings: There is a serpiginous approximately 5.7 x 6.2 x 2.7 cm fluid collection within the left popliteal fossa compatible with a Baker cyst, grossly unchanged compared to the 04/2018 examination, previously, 5.9 cm  IMPRESSION: 1. No evidence of DVT within either lower extremity. 2. 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   Electronically Signed   By: Sandi Mariscal M.D.   On: 08/11/2018 11:12     Antimicrobials:   Augmentin 8/15 >> 8/17 Doxycycline 8/17 >>   Subjective: Still out of breath.  Severe heart burn in throat and chest discomfort with ambulating.  No confusion, sputum.  Still with cough.  No fever.  Objective: Vitals:   08/13/18 2348 08/14/18 0300 08/14/18 0332 08/14/18 0542  BP: 110/75   (!) 137/91  Pulse: 94  (!) 110 (!) 112  Resp: 17  (!) 27 18  Temp:    97.7 F (36.5 C)  TempSrc:    Oral  SpO2: 100%  96% 92%  Weight:  87.9 kg    Height:        Intake/Output Summary (Last 24 hours) at 08/14/2018 1020 Last data filed at 08/14/2018 0714 Gross per 24 hour  Intake 1005 ml  Output  1050 ml  Net -45 ml   Filed Weights   08/12/18 0547 08/13/18 0524 08/14/18 0300  Weight: 89.9 kg 98.3 kg 87.9 kg    Examination: General appearance: obese adult female, alert and in moderate distress from dyspnea.   HEENT: Anicteric, conjunctiva pink, lids and lashes normal. No nasal deformity, discharge, epistaxis.  Lips moist.   Skin: Warm and dry.  no jaundice.  No suspicious rashes or lesions. Cardiac: RRR, nl S1-S2, no murmurs appreciated.  Capillary refill is brisk.  JVP not visible.  No LE edema.  Radia  pulses 2+ and symmetric. Respiratory: Tachypneic, wheezing thorughout Abdomen: Abdomen soft.  no TTP. No ascites, distension, hepatosplenomegaly.   MSK: No deformities or effusions. Neuro: Awake and alert.  EOMI, moves all extremities. Speech fluent.    Psych: Sensorium intact and responding to questions, attention normal. Affect normal.  Judgment and insight appear normal=.    Data Reviewed: I have personally reviewed following labs and imaging studies:  CBC: Recent Labs  Lab 08/10/18 1734 08/11/18 0458 08/13/18 0423 08/14/18 0409  WBC 16.4* 17.2* 19.5* 17.9*  NEUTROABS 12.9 15.7*  --   --   HGB 13.1 12.2 13.1 13.8  HCT 42.8 40.5 42.7 45.1  MCV 100.5* 100.2* 99.5 98.7  PLT 269 290 276 220   Basic Metabolic Panel: Recent Labs  Lab 08/10/18 1734 08/11/18 0458 08/12/18 0438 08/12/18 1143 08/13/18 0423 08/14/18 0409  NA 142 141 139 140 139 142  K 3.4* 4.3 5.2* 4.6 4.5 4.2  CL 92* 90* 92* 92* 93* 96*  CO2 42* 38* 39* 39* 36* 35*  GLUCOSE 179* 220* 180* 120* 159* 146*  BUN 7* 10  18 18 22 20   CREATININE 0.81 0.78 0.77 0.80 0.85 0.93  CALCIUM 9.1 9.3 9.2 9.3 9.3 8.4*  MG 2.0  --  2.3  --   --  2.2  PHOS 2.9  --   --   --   --   --    GFR: Estimated Creatinine Clearance: 63.2 mL/min (by C-G formula based on SCr of 0.93 mg/dL). Liver Function Tests: Recent Labs  Lab 08/11/18 0458  AST 35  ALT 35  ALKPHOS 55  BILITOT 0.5  PROT 6.4*  ALBUMIN 3.3*   No  results for input(s): LIPASE, AMYLASE in the last 168 hours. No results for input(s): AMMONIA in the last 168 hours. Coagulation Profile: Recent Labs  Lab 08/12/18 1143  INR 0.96   Cardiac Enzymes: Recent Labs  Lab 08/12/18 0942 08/12/18 1555 08/12/18 2125  TROPONINI 0.50* 0.50* 0.42*   BNP (last 3 results) No results for input(s): PROBNP in the last 8760 hours. HbA1C: No results for input(s): HGBA1C in the last 72 hours. CBG: Recent Labs  Lab 08/13/18 0750 08/13/18 1156 08/13/18 1727 08/13/18 2124 08/14/18 0702  GLUCAP 136* 116* 175* 137* 130*   Lipid Profile: No results for input(s): CHOL, HDL, LDLCALC, TRIG, CHOLHDL, LDLDIRECT in the last 72 hours. Thyroid Function Tests: No results for input(s): TSH, T4TOTAL, FREET4, T3FREE, THYROIDAB in the last 72 hours. Anemia Panel: No results for input(s): VITAMINB12, FOLATE, FERRITIN, TIBC, IRON, RETICCTPCT in the last 72 hours. Urine analysis:    Component Value Date/Time   COLORURINE STRAW (A) 06/04/2018 1340   APPEARANCEUR CLEAR 06/04/2018 1340   LABSPEC 1.004 (L) 06/04/2018 1340   PHURINE 9.0 (H) 06/04/2018 1340   GLUCOSEU NEGATIVE 06/04/2018 1340   HGBUR NEGATIVE 06/04/2018 1340   BILIRUBINUR NEGATIVE 06/04/2018 1340   KETONESUR NEGATIVE 06/04/2018 1340   PROTEINUR NEGATIVE 06/04/2018 1340   UROBILINOGEN 0.2 04/15/2014 1949   NITRITE NEGATIVE 06/04/2018 1340   LEUKOCYTESUR NEGATIVE 06/04/2018 1340   Sepsis Labs: @LABRCNTIP (procalcitonin:4,lacticacidven:4)  )No results found for this or any previous visit (from the past 240 hour(s)).       Radiology Studies: Dg Chest Port 1 View  Result Date: 08/13/2018 CLINICAL DATA:  Respiratory distress. EXAM: PORTABLE CHEST 1 VIEW COMPARISON:  08/10/2018 FINDINGS: The heart size and mediastinal contours are within normal limits. Both lungs are clear. The visualized skeletal structures are unremarkable. IMPRESSION: No active disease. Electronically Signed   By: Kathreen Devoid   On: 08/13/2018 15:38        Scheduled Meds: . amoxicillin-clavulanate  1 tablet Oral Q12H  . aspirin EC  81 mg Oral q morning - 10a  . atorvastatin  40 mg Oral q1800  . calcium-vitamin D  1 tablet Oral BID  . furosemide  40 mg Oral QODAY  . furosemide  80 mg Oral QODAY  . guaiFENesin  1,200 mg Oral BID  . insulin aspart  0-20 Units Subcutaneous TID WC  . insulin aspart  0-5 Units Subcutaneous QHS  . insulin aspart  6 Units Subcutaneous TID WC  . ipratropium-albuterol  3 mL Nebulization Q6H  . levalbuterol  0.63 mg Nebulization Q6H  . methylPREDNISolone (SOLU-MEDROL) injection  60 mg Intravenous Q12H  . metoprolol tartrate  25 mg Oral BID  . mometasone-formoterol  2 puff Inhalation BID  . nicotine  21 mg Transdermal Daily  . pantoprazole  40 mg Oral BID  . pneumococcal 23 valent vaccine  0.5 mL Intramuscular Tomorrow-1000  . polyethylene glycol  17 g Oral  Daily  . sodium chloride flush  3 mL Intravenous Q12H  . sodium polystyrene  30 g Oral Once   Continuous Infusions: . sodium chloride    . sodium chloride 75 mL/hr at 08/14/18 0130  . heparin 850 Units/hr (08/13/18 1125)     LOS: 2 days    Time spent: 25 minutes    Edwin Dada, MD [facility] 08/14/2018, 10:20 AM     Pager [phone] --- please page though AMION:  [email or web address] Password [email or web address] If 7PM-7AM, please contact night-coverage

## 2018-08-14 NOTE — Progress Notes (Signed)
RT NOTE:  RN called stating patient was requesting to come off BIPAP for water and to go to bathroom. Pt placed on 3L [place].

## 2018-08-14 NOTE — Progress Notes (Signed)
ANTICOAGULATION CONSULT NOTE  Pharmacy Consult for Heparin Indication: chest pain/ACS  Patient Measurements: Height: 5\' 1"  (154.9 cm) Weight: 193 lb 11.2 oz (87.9 kg)(Scale A) IBW/kg (Calculated) : 47.8 HEPARIN DW (KG): 68.8  Vital Signs: Temp: 97.7 F (36.5 C) (08/17 0542) Temp Source: Oral (08/17 0542) BP: 137/91 (08/17 0542) Pulse Rate: 112 (08/17 0542)  Labs: Recent Labs    08/12/18 0942 08/12/18 1143 08/12/18 1555 08/12/18 1854 08/12/18 2125 08/13/18 0423 08/14/18 0409  HGB  --   --   --   --   --  13.1 13.8  HCT  --   --   --   --   --  42.7 45.1  PLT  --   --   --   --   --  276 272  LABPROT  --  12.7  --   --   --   --   --   INR  --  0.96  --   --   --   --   --   HEPARINUNFRC  --   --   --  0.36  --  0.46 0.46  CREATININE  --  0.80  --   --   --  0.85 0.93  TROPONINI 0.50*  --  0.50*  --  0.42*  --   --     Estimated Creatinine Clearance: 63.2 mL/min (by C-G formula based on SCr of 0.93 mg/dL).   MeAssessment: 62 yo female presenting with acute COPD exacerbation and chest pain. Troponin 0.01>0.5>0.42. Pharmacy consulted to start heparin infusion for ACS.  Heparin level is therapeutic at 0.46, on 850 units/hr. Hgb 13.8, plt 272. No s/sx of bleeding. No infusion issues.   Goal of Therapy:  Heparin level 0.3-0.7 units/ml Monitor platelets by anticoagulation protocol: Yes   Plan:  Continue heparin infusion at 850 units/hr Monitor daily HL, CBC, for s/sx of bleeding Plan for cardiac cath on 8/19  Doylene Canard, PharmD Clinical Pharmacist  Pager: [phone] Phone: [phone] 08/14/2018 10:32 AM

## 2018-08-15 DIAGNOSIS — J9620 Acute and chronic respiratory failure, unspecified whether with hypoxia or hypercapnia: Secondary | ICD-10-CM

## 2018-08-15 DIAGNOSIS — I214 Non-ST elevation (NSTEMI) myocardial infarction: Secondary | ICD-10-CM

## 2018-08-15 DIAGNOSIS — I10 Essential (primary) hypertension: Secondary | ICD-10-CM

## 2018-08-15 DIAGNOSIS — I252 Old myocardial infarction: Secondary | ICD-10-CM

## 2018-08-15 LAB — GLUCOSE, CAPILLARY
GLUCOSE-CAPILLARY: 161 mg/dL — AB (ref 70–99)
Glucose-Capillary: 204 mg/dL — ABNORMAL HIGH (ref 70–99)
Glucose-Capillary: 81 mg/dL (ref 70–99)
Glucose-Capillary: 172 mg/dL — ABNORMAL HIGH (ref 70–99)

## 2018-08-15 LAB — BASIC METABOLIC PANEL
Anion gap: 8 (ref 5–15)
BUN: 26 mg/dL — ABNORMAL HIGH (ref 8–23)
CALCIUM: 8.8 mg/dL — AB (ref 8.9–10.3)
CO2: 34 mmol/L — ABNORMAL HIGH (ref 22–32)
CREATININE: 0.99 mg/dL (ref 0.44–1.00)
Chloride: 99 mmol/L (ref 98–111)
GFR calc non Af Amer: 60 mL/min — ABNORMAL LOW (ref >60)
Glucose, Bld: 183 mg/dL — ABNORMAL HIGH (ref 70–99)
Potassium: 4.4 mmol/L (ref 3.5–5.1)
SODIUM: 141 mmol/L (ref 135–145)

## 2018-08-15 LAB — CBC
HCT: 40.5 % (ref 36.0–46.0)
HEMOGLOBIN: 12.5 g/dL (ref 12.0–15.0)
MCH: 30.4 pg (ref 26.0–34.0)
MCHC: 30.9 g/dL (ref 30.0–36.0)
MCV: 98.5 fL (ref 78.0–100.0)
Platelets: 250 10*3/uL (ref 150–400)
RBC: 4.11 MIL/uL (ref 3.87–5.11)
RDW: 14.6 % (ref 11.5–15.5)
WBC: 13.5 10*3/uL — AB (ref 4.0–10.5)

## 2018-08-15 LAB — HEPARIN LEVEL (UNFRACTIONATED): HEPARIN UNFRACTIONATED: 0.3 [IU]/mL (ref 0.30–0.70)

## 2018-08-15 MED ORDER — ACETAMINOPHEN 160 MG/5ML PO SOLN
650.0000 mg | Freq: Four times a day (QID) | ORAL | Status: DC | PRN
  Administered 2018-08-15 (×2): 650 mg via ORAL
  Filled 2018-08-15 (×3): qty 20.3

## 2018-08-15 MED ORDER — IPRATROPIUM-ALBUTEROL 0.5-2.5 (3) MG/3ML IN SOLN
3.0000 mL | Freq: Three times a day (TID) | RESPIRATORY_TRACT | Status: DC
  Administered 2018-08-15 – 2018-08-21 (×18): 3 mL via RESPIRATORY_TRACT
  Filled 2018-08-15 (×20): qty 3

## 2018-08-15 MED ORDER — GUAIFENESIN-DM 100-10 MG/5ML PO SYRP
5.0000 mL | ORAL_SOLUTION | ORAL | Status: DC | PRN
  Administered 2018-08-15 – 2018-08-21 (×6): 5 mL via ORAL
  Filled 2018-08-15 (×6): qty 5

## 2018-08-15 MED ORDER — IPRATROPIUM-ALBUTEROL 0.5-2.5 (3) MG/3ML IN SOLN
3.0000 mL | Freq: Four times a day (QID) | RESPIRATORY_TRACT | Status: DC
  Administered 2018-08-15: 3 mL via RESPIRATORY_TRACT
  Filled 2018-08-15: qty 3

## 2018-08-15 MED ORDER — SALINE SPRAY 0.65 % NA SOLN
1.0000 | NASAL | Status: DC | PRN
Start: 2018-08-15 — End: 2018-08-21
  Filled 2018-08-15: qty 44

## 2018-08-15 NOTE — Progress Notes (Signed)
Progress Note  Patient Name: Cindy [name] Date of Encounter: 08/15/2018  Primary Cardiologist: No primary care provider on file.   Subjective   She has some exertional dyspnea when walking to the bathroom and back but otherwise is feeling better than she has in the past few days.  She denies chest pain and throat pain.  Inpatient Medications    Scheduled Meds: . amoxicillin-clavulanate  1 tablet Oral Q12H  . aspirin EC  81 mg Oral q morning - 10a  . atorvastatin  40 mg Oral q1800  . calcium-vitamin D  1 tablet Oral BID  . furosemide  40 mg Oral QODAY  . furosemide  80 mg Oral QODAY  . insulin aspart  0-20 Units Subcutaneous TID WC  . insulin aspart  0-5 Units Subcutaneous QHS  . insulin aspart  6 Units Subcutaneous TID WC  . ipratropium-albuterol  3 mL Nebulization TID  . methylPREDNISolone (SOLU-MEDROL) injection  60 mg Intravenous Q12H  . metoprolol tartrate  25 mg Oral BID  . mometasone-formoterol  2 puff Inhalation BID  . nicotine  21 mg Transdermal Daily  . pantoprazole  40 mg Oral BID  . pneumococcal 23 valent vaccine  0.5 mL Intramuscular Tomorrow-1000  . polyethylene glycol  17 g Oral Daily  . sodium chloride flush  3 mL Intravenous Q12H   Continuous Infusions: . sodium chloride    . heparin 900 Units/hr (08/15/18 1006)   PRN Meds: sodium chloride, acetaminophen (TYLENOL) oral liquid 160 mg/5 mL, albuterol, alum & mag hydroxide-simeth, diphenhydrAMINE, famotidine, guaiFENesin-dextromethorphan, hydrALAZINE, labetalol, ondansetron **OR** ondansetron (ZOFRAN) IV, oxyCODONE-acetaminophen, sodium chloride flush   Vital Signs    Vitals:   08/15/18 0341 08/15/18 0820 08/15/18 0822 08/15/18 1010  BP:    (!) 141/85  Pulse:    95  Resp:      Temp:      TempSrc:      SpO2: 96% 95% 97%   Weight:      Height:        Intake/Output Summary (Last 24 hours) at 08/15/2018 1159 Last data filed at 08/15/2018 1006 Gross per 24 hour  Intake 608.78 ml  Output 651 ml    Net -42.22 ml   Filed Weights   08/13/18 0524 08/14/18 0300 08/15/18 0309  Weight: 98.3 kg 87.9 kg 89.4 kg    Telemetry    Sinus rhythm- Personally Reviewed  ECG    No new tracings- Personally Reviewed  Physical Exam   GEN: No acute distress.   Neck: No JVD Cardiac: RRR, no murmurs, rubs, or gallops.  Respiratory:  Poor air movement with diffuse wheezing GI: Soft, nontender, non-distended  MS:  Trivial bilateral peri-ankle and dorsal pedal edema; No deformity. Neuro:  Nonfocal  Psych: Normal affect   Labs    Chemistry Recent Labs  Lab 08/11/18 0458  08/13/18 0423 08/14/18 0409 08/15/18 0258  NA 141   < > 139 142 141  K 4.3   < > 4.5 4.2 4.4  CL 90*   < > 93* 96* 99  CO2 38*   < > 36* 35* 34*  GLUCOSE 220*   < > 159* 146* 183*  BUN 10   < > 22 20 26*  CREATININE 0.78   < > 0.85 0.93 0.99  CALCIUM 9.3   < > 9.3 8.4* 8.8*  PROT 6.4*  --   --   --   --   ALBUMIN 3.3*  --   --   --   --  AST 35  --   --   --   --   ALT 35  --   --   --   --   ALKPHOS 55  --   --   --   --   BILITOT 0.5  --   --   --   --   GFRNONAA >60   < > >60 >60 60*  GFRAA >60   < > >60 >60 >60  ANIONGAP 13   < > 10 11 8    < > = values in this interval not displayed.     Hematology Recent Labs  Lab 08/13/18 0423 08/14/18 0409 08/15/18 0258  WBC 19.5* 17.9* 13.5*  RBC 4.29 4.57 4.11  HGB 13.1 13.8 12.5  HCT 42.7 45.1 40.5  MCV 99.5 98.7 98.5  MCH 30.5 30.2 30.4  MCHC 30.7 30.6 30.9  RDW 14.7 14.5 14.6  PLT 276 272 250    Cardiac Enzymes Recent Labs  Lab 08/12/18 0942 08/12/18 1555 08/12/18 2125  TROPONINI 0.50* 0.50* 0.42*    Recent Labs  Lab 08/10/18 1804  TROPIPOC 0.01     BNP Recent Labs  Lab 08/10/18 1735  BNP 32.0     DDimer No results for input(s): DDIMER in the last 168 hours.   Radiology    Dg Chest Port 1 View  Result Date: 08/13/2018 CLINICAL DATA:  Respiratory distress. EXAM: PORTABLE CHEST 1 VIEW COMPARISON:  08/10/2018 FINDINGS: The heart  size and mediastinal contours are within normal limits. Both lungs are clear. The visualized skeletal structures are unremarkable. IMPRESSION: No active disease. Electronically Signed   By: Kathreen Devoid   On: 08/13/2018 15:38    Cardiac Studies   Echocardiogram 08/11/2018:  Left ventricle: The cavity size was normal. Wall thickness was increased in a pattern of mild LVH. The estimated ejection fraction was 55%. There is hypokinesis of the mid-apicalanteroseptal myocardium. Indeterminate diastolic function. - Aortic valve: Mildly to moderately calcified annulus. Trileaflet; mildly calcified leaflets. Mean gradient (S): 10 mm Hg. Peak gradient (S): 20 mm Hg. Moderately sclerotic to mildly stenotic aortic valve. VTI ratio of LVOT to aortic valve: 0.58. - Mitral valve: There was trivial regurgitation. - Right atrium: Central venous pressure (est): 3 mm Hg. - Atrial septum: No defect or patent foramen ovale was identified. - Tricuspid valve: There was trivial regurgitation. - Pulmonary arteries: Systolic pressure could not be accurately estimated. - Pericardium, extracardiac: A prominent pericardial fat pad was present   Patient Profile     62 y.o. female with a hx ofCOPDand currently being treated for a COPD exacerbation, hyperlipidemia, hypertension,T11 compression fracture and chronic pain,and tobacco abuse who is being seen today for the evaluation ofchest painat the request of Dr. Wynetta Emery.  Assessment & Plan    1. Non-STEMI:  She is symptomatically improved today and does not complain of chest pain and throat pain with exertion.  She has been transferred from [facility] to [facility].  Coronary angiography has now been scheduled for 08/16/2018. Troponins peaked at 0.5 and most recent value was 0.42. Continue aspirin, heparin, atorvastatin, and metoprolol. She did have coronary atherosclerosis by chest CT in June 8841 and she certainly has several  cardiovascular risk factors. Left ventricular systolic function was normal but she did have a wall motion abnormalityas reviewed above.  2. Acute COPD exacerbation: She is on IV Solu-Medrol and Augmentin. She is also on Dulera and albuterol nebs.  3. Hypertension: Blood pressure today is  mildly  elevated today and was normal yesterday. She is on metoprolol.If blood pressure becomes persistently elevated, an ACEI/ARB can be added.  4. Hyperlipidemia:Now on atorvastatin(I started 8/15). Lipid panel 10/13/2017 showed total cholesterol 204, HDL 59, triglycerides 162, LDL 117.  5. Bilateral leg edema: Currently on alternating doses of Lasix 40 and 80 mg.  BUN is mildly elevated to 26.  Continue monitoring renal function.  Creatinine is normal.  6. Tobacco abuse: She needs cessation.  For questions or updates, please contact [place] Please consult [email or web address] for contact info under Cardiology/STEMI.      Signed, Kate Sable, MD  08/15/2018, 11:59 AM

## 2018-08-15 NOTE — Progress Notes (Signed)
ANTICOAGULATION CONSULT NOTE  Pharmacy Consult for Heparin Indication: chest pain/ACS  Patient Measurements: Height: 5\' 1"  (154.9 cm) Weight: 197 lb 3.2 oz (89.4 kg)(Scale A) IBW/kg (Calculated) : 47.8 HEPARIN DW (KG): 68.8  Vital Signs: Temp: 97.6 F (36.4 C) (08/18 0309) Temp Source: Oral (08/18 0309) BP: 138/95 (08/18 0309) Pulse Rate: 99 (08/18 0309)  Labs: Recent Labs    08/12/18 0942  08/12/18 1143 08/12/18 1555  08/12/18 2125  08/13/18 0423 08/14/18 0409 08/15/18 0258  HGB  --   --   --   --   --   --    < > 13.1 13.8 12.5  HCT  --   --   --   --   --   --   --  42.7 45.1 40.5  PLT  --   --   --   --   --   --   --  276 272 250  LABPROT  --   --  12.7  --   --   --   --   --   --   --   INR  --   --  0.96  --   --   --   --   --   --   --   HEPARINUNFRC  --   --   --   --    < >  --   --  0.46 0.46 0.30  CREATININE  --    < > 0.80  --   --   --   --  0.85 0.93 0.99  TROPONINI 0.50*  --   --  0.50*  --  0.42*  --   --   --   --    < > = values in this interval not displayed.    Estimated Creatinine Clearance: 59.9 mL/min (by C-G formula based on SCr of 0.99 mg/dL).   Assessment: 62 yo female presenting with acute COPD exacerbation and chest pain. Troponin 0.01>0.5>0.42. Coronary angiography has been scheduled for 08/16/2018. Pharmacy consulted to manage heparin infusion for ACS.   Heparin level is at the low end of therapeutic on 850 units/hr. Hgb 12.5, plt 250. No s/sx of bleeding. No infusion issues noted per nursing.  Goal of Therapy:  Heparin level 0.3-0.7 units/ml Monitor platelets by anticoagulation protocol: Yes   Plan:  Slightly increase heparin infusion to 900 units/hr to keep level therapeutic Monitor daily HL, CBC, for s/sx of bleeding Plan for cardiac cath on 8/19  Thank you for involving pharmacy in this patient's care.  Janae Bridgeman, PharmD PGY1 Pharmacy Resident Phone: [phone] 08/15/2018 7:29 AM

## 2018-08-15 NOTE — Progress Notes (Signed)
PROGRESS NOTE    Cindy [name]  IOE:[id number] DOB: [birth year] DOA: 08/10/2018 PCP: Alycia Rossetti, MD      Brief Narrative:  Cindy [name] is a 62 y.o. F with COPD on home O2, smoking, dCHF, diabetes who presents with progressive shortness of breath and leg swelling, initial troponin normal, admitted for COPD flare.  While in the hospital, noted to have exertional chest discomfort, troponins cycled and found to have NSTEMI and transferred to [facility] for LHC.      Assessment & Plan:  NSTEMI -Continue heparin, defer duration and Plavix to Caridology -Continue apsirin, atorvastatin, metoprolol -If plan for ACEi/ARB, consider ARB given COPD/chronic cough -Consult cardiology, appreciate expert cares  COPD exacerbation Still extremely wheezy.  Stable on home O2 though. -Continue Medrol twice daily -Continue PPI -Continue nebulized bronchodilators, scheduled and as needed -She may have Robitussin, acetaminophen as needed -Continue home ICS/LABA -Continue Augmentin   Acute on chronic diastolic CHF EF 18% this hospitalization. -Continue home oral Lasix, alternating daily dose -Daily BMP -Continue strict ins and outs, daily weights  Diabetes Glucose reasonable control Continue mealtime insulin -Continue sliding scale corrections    LE edema DVT study negative of both legs.  Smoking Cessation recommended, modalities discussed  GERD -Continue PPI -H2RA PRN    DVT prophylaxis: Not applicable, on heparin Code Status: Full code Family Communication: Husband at the bedside MDM and disposition Plan: The below labs and imaging reports were reviewed and summarized above.  Medication management as above.  The patient was admitted with a COPD flare leg swelling.  She was found to have exertional chest discomfort, and ST EMI.  Now she is been transferred to [facility] for left heart cath, planned for Monday.  In the meantime we will continue to treat with IV steroids for severe COPD  exacerbation  Likely to home in 1-2 days after cath    Consultants:   Cardiology  Procedures:   Echo 8/14 LV EF: 55%  ------------------------------------------------------------------- History:   PMH:  Acquired from the patient and from the patient&'s chart.  Chronic obstructive pulmonary disease.  PMH:  GERD. Tobacco abuse.  Risk factors:  Dyslipidemia.  ------------------------------------------------------------------- Study Conclusions  - Left ventricle: The cavity size was normal. Wall thickness was   increased in a pattern of mild LVH. The estimated ejection   fraction was 55%. There is hypokinesis of the   mid-apicalanteroseptal myocardium. Indeterminate diastolic   function. - Aortic valve: Mildly to moderately calcified annulus. Trileaflet;   mildly calcified leaflets. Mean gradient (S): 10 mm Hg. Peak   gradient (S): 20 mm Hg. Moderately sclerotic to mildly stenotic   aortic valve. VTI ratio of LVOT to aortic valve: 0.58. - Mitral valve: There was trivial regurgitation. - Right atrium: Central venous pressure (est): 3 mm Hg. - Atrial septum: No defect or patent foramen ovale was identified. - Tricuspid valve: There was trivial regurgitation. - Pulmonary arteries: Systolic pressure could not be accurately   estimated. - Pericardium, extracardiac: A prominent pericardial fat pad was   present.   DVT LE8/14 FINDINGS: RIGHT LOWER EXTREMITY  Common Femoral Vein: No evidence of thrombus. Normal compressibility, respiratory phasicity and response to augmentation.  Saphenofemoral Junction: No evidence of thrombus. Normal compressibility and flow on color Doppler imaging.  Profunda Femoral Vein: No evidence of thrombus. Normal compressibility and flow on color Doppler imaging.  Femoral Vein: No evidence of thrombus. Normal compressibility, respiratory phasicity and response to augmentation.  Popliteal Vein: No evidence of thrombus. Normal  compressibility,  respiratory phasicity and response to augmentation.  Calf Veins: No evidence of thrombus. Normal compressibility and flow on color Doppler imaging.  Superficial Great Saphenous Vein: No evidence of thrombus. Normal compressibility.  Venous Reflux:  None.  Other Findings:  None.  LEFT LOWER EXTREMITY  Common Femoral Vein: No evidence of thrombus. Normal compressibility, respiratory phasicity and response to augmentation.  Saphenofemoral Junction: No evidence of thrombus. Normal compressibility and flow on color Doppler imaging.  Profunda Femoral Vein: No evidence of thrombus. Normal compressibility and flow on color Doppler imaging.  Femoral Vein: No evidence of thrombus. Normal compressibility, respiratory phasicity and response to augmentation.  Popliteal Vein: No evidence of thrombus. Normal compressibility, respiratory phasicity and response to augmentation.  Calf Veins: No evidence of thrombus. Normal compressibility and flow on color Doppler imaging.  Superficial Great Saphenous Vein: No evidence of thrombus. Normal compressibility.  Venous Reflux:  None.  Other Findings: There is a serpiginous approximately 5.7 x 6.2 x 2.7 cm fluid collection within the left popliteal fossa compatible with a Baker cyst, grossly unchanged compared to the 04/2018 examination, previously, 5.9 cm  IMPRESSION: 1. No evidence of DVT within either lower extremity. 2. 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   Electronically Signed   By: Sandi Mariscal M.D.   On: 08/11/2018 11:12     Antimicrobials:   Augmentin 8/15 >>     Subjective: Still wheezy, still cough, still on her breath at rest.  She still has heartburn and chest discomfort with ambulating.  No new fever, confusion.  Objective: Vitals:   08/15/18 0341 08/15/18 0820 08/15/18 0822 08/15/18 1010  BP:    (!) 141/85  Pulse:    95  Resp:        Temp:      TempSrc:      SpO2: 96% 95% 97%   Weight:      Height:        Intake/Output Summary (Last 24 hours) at 08/15/2018 1036 Last data filed at 08/15/2018 1006 Gross per 24 hour  Intake 608.78 ml  Output 651 ml  Net -42.22 ml   Filed Weights   08/13/18 0524 08/14/18 0300 08/15/18 0309  Weight: 98.3 kg 87.9 kg 89.4 kg    Examination: General appearance: Obese adult female, lying in bed, no acute distress, interactive. HEENT: Anicteric, conjunctive are pink, lids and lashes normal.  No nasal deformity, discharge, epistaxis.  Lips moist, oropharynx moist, no oral lesions, hearing normal..   Skin: Skin warm and dry, no suspicious rashes or lesions. Cardiac: Tachycardic, regular, no murmurs, JVP not visible due to body habitus, no lower extremity edema. Respiratory: Mild tachypnea, no accessory muscle use, sounds out of breath at rest, wheezing throughout, extreme Abdomen: Abdomen soft, no tenderness to palpation, no ascites or distention.   MSK: No deformities or effusions. Neuro: Awake and alert, extraocular movements intact, face symmetric, moves all extremities with 5/5 strength and normal coordination, speech fluent.    Psych: Sensorium intact and responding to questions, attention normal, affect normal.  Judgment and insight appear slightly impaired, due to baseline cognitive function .    Data Reviewed: I have personally reviewed following labs and imaging studies:  CBC: Recent Labs  Lab 08/10/18 1734 08/11/18 0458 08/13/18 0423 08/14/18 0409 08/15/18 0258  WBC 16.4* 17.2* 19.5* 17.9* 13.5*  NEUTROABS 12.9 15.7*  --   --   --   HGB 13.1 12.2 13.1 13.8 12.5  HCT 42.8 40.5 42.7 45.1 40.5  MCV 100.5* 100.2* 99.5 98.7 98.5  PLT 269 290 276 272 628   Basic Metabolic Panel: Recent Labs  Lab 08/10/18 1734  08/12/18 0438 08/12/18 1143 08/13/18 0423 08/14/18 0409 08/15/18 0258  NA 142   < > 139 140 139 142 141  K 3.4*   < > 5.2* 4.6 4.5 4.2 4.4  CL 92*   < >  92* 92* 93* 96* 99  CO2 42*   < > 39* 39* 36* 35* 34*  GLUCOSE 179*   < > 180* 120* 159* 146* 183*  BUN 7*   < > 18 18 22 20  26*  CREATININE 0.81   < > 0.77 0.80 0.85 0.93 0.99  CALCIUM 9.1   < > 9.2 9.3 9.3 8.4* 8.8*  MG 2.0  --  2.3  --   --  2.2  --   PHOS 2.9  --   --   --   --   --   --    < > = values in this interval not displayed.   GFR: Estimated Creatinine Clearance: 59.9 mL/min (by C-G formula based on SCr of 0.99 mg/dL). Liver Function Tests: Recent Labs  Lab 08/11/18 0458  AST 35  ALT 35  ALKPHOS 55  BILITOT 0.5  PROT 6.4*  ALBUMIN 3.3*   No results for input(s): LIPASE, AMYLASE in the last 168 hours. No results for input(s): AMMONIA in the last 168 hours. Coagulation Profile: Recent Labs  Lab 08/12/18 1143  INR 0.96   Cardiac Enzymes: Recent Labs  Lab 08/12/18 0942 08/12/18 1555 08/12/18 2125  TROPONINI 0.50* 0.50* 0.42*   BNP (last 3 results) No results for input(s): PROBNP in the last 8760 hours. HbA1C: No results for input(s): HGBA1C in the last 72 hours. CBG: Recent Labs  Lab 08/14/18 0702 08/14/18 1147 08/14/18 1659 08/14/18 2113 08/15/18 0638  GLUCAP 130* 104* 112* 102* 161*   Lipid Profile: No results for input(s): CHOL, HDL, LDLCALC, TRIG, CHOLHDL, LDLDIRECT in the last 72 hours. Thyroid Function Tests: No results for input(s): TSH, T4TOTAL, FREET4, T3FREE, THYROIDAB in the last 72 hours. Anemia Panel: No results for input(s): VITAMINB12, FOLATE, FERRITIN, TIBC, IRON, RETICCTPCT in the last 72 hours. Urine analysis:    Component Value Date/Time   COLORURINE STRAW (A) 06/04/2018 1340   APPEARANCEUR CLEAR 06/04/2018 1340   LABSPEC 1.004 (L) 06/04/2018 1340   PHURINE 9.0 (H) 06/04/2018 1340   GLUCOSEU NEGATIVE 06/04/2018 1340   HGBUR NEGATIVE 06/04/2018 1340   BILIRUBINUR NEGATIVE 06/04/2018 1340   KETONESUR NEGATIVE 06/04/2018 1340   PROTEINUR NEGATIVE 06/04/2018 1340   UROBILINOGEN 0.2 04/15/2014 1949   NITRITE NEGATIVE  06/04/2018 1340   LEUKOCYTESUR NEGATIVE 06/04/2018 1340   Sepsis Labs: @LABRCNTIP (procalcitonin:4,lacticacidven:4)  )No results found for this or any previous visit (from the past 240 hour(s)).       Radiology Studies: Dg Chest Port 1 View  Result Date: 08/13/2018 CLINICAL DATA:  Respiratory distress. EXAM: PORTABLE CHEST 1 VIEW COMPARISON:  08/10/2018 FINDINGS: The heart size and mediastinal contours are within normal limits. Both lungs are clear. The visualized skeletal structures are unremarkable. IMPRESSION: No active disease. Electronically Signed   By: Kathreen Devoid   On: 08/13/2018 15:38        Scheduled Meds: . amoxicillin-clavulanate  1 tablet Oral Q12H  . aspirin EC  81 mg Oral q morning - 10a  . atorvastatin  40 mg Oral q1800  . calcium-vitamin D  1 tablet Oral BID  . furosemide  40 mg Oral QODAY  . furosemide  80 mg Oral QODAY  . insulin aspart  0-20 Units Subcutaneous TID WC  . insulin aspart  0-5 Units Subcutaneous QHS  . insulin aspart  6 Units Subcutaneous TID WC  . ipratropium-albuterol  3 mL Nebulization QID  . methylPREDNISolone (SOLU-MEDROL) injection  60 mg Intravenous Q12H  . metoprolol tartrate  25 mg Oral BID  . mometasone-formoterol  2 puff Inhalation BID  . nicotine  21 mg Transdermal Daily  . pantoprazole  40 mg Oral BID  . pneumococcal 23 valent vaccine  0.5 mL Intramuscular Tomorrow-1000  . polyethylene glycol  17 g Oral Daily  . sodium chloride flush  3 mL Intravenous Q12H   Continuous Infusions: . sodium chloride    . heparin 900 Units/hr (08/15/18 1006)     LOS: 3 days    Time spent: 25 minutes    Edwin Dada, MD [facility] 08/15/2018, 10:36 AM     Pager [phone] --- please page though AMION:  [email or web address] If 7PM-7AM, please contact night-coverage

## 2018-08-15 NOTE — H&P (View-Only) (Signed)
Progress Note  Patient Name: Cindy [name] Date of Encounter: 08/15/2018  Primary Cardiologist: No primary care provider on file.   Subjective   She has some exertional dyspnea when walking to the bathroom and back but otherwise is feeling better than she has in the past few days.  She denies chest pain and throat pain.  Inpatient Medications    Scheduled Meds: . amoxicillin-clavulanate  1 tablet Oral Q12H  . aspirin EC  81 mg Oral q morning - 10a  . atorvastatin  40 mg Oral q1800  . calcium-vitamin D  1 tablet Oral BID  . furosemide  40 mg Oral QODAY  . furosemide  80 mg Oral QODAY  . insulin aspart  0-20 Units Subcutaneous TID WC  . insulin aspart  0-5 Units Subcutaneous QHS  . insulin aspart  6 Units Subcutaneous TID WC  . ipratropium-albuterol  3 mL Nebulization TID  . methylPREDNISolone (SOLU-MEDROL) injection  60 mg Intravenous Q12H  . metoprolol tartrate  25 mg Oral BID  . mometasone-formoterol  2 puff Inhalation BID  . nicotine  21 mg Transdermal Daily  . pantoprazole  40 mg Oral BID  . pneumococcal 23 valent vaccine  0.5 mL Intramuscular Tomorrow-1000  . polyethylene glycol  17 g Oral Daily  . sodium chloride flush  3 mL Intravenous Q12H   Continuous Infusions: . sodium chloride    . heparin 900 Units/hr (08/15/18 1006)   PRN Meds: sodium chloride, acetaminophen (TYLENOL) oral liquid 160 mg/5 mL, albuterol, alum & mag hydroxide-simeth, diphenhydrAMINE, famotidine, guaiFENesin-dextromethorphan, hydrALAZINE, labetalol, ondansetron **OR** ondansetron (ZOFRAN) IV, oxyCODONE-acetaminophen, sodium chloride flush   Vital Signs    Vitals:   08/15/18 0341 08/15/18 0820 08/15/18 0822 08/15/18 1010  BP:    (!) 141/85  Pulse:    95  Resp:      Temp:      TempSrc:      SpO2: 96% 95% 97%   Weight:      Height:        Intake/Output Summary (Last 24 hours) at 08/15/2018 1159 Last data filed at 08/15/2018 1006 Gross per 24 hour  Intake 608.78 ml  Output 651 ml    Net -42.22 ml   Filed Weights   08/13/18 0524 08/14/18 0300 08/15/18 0309  Weight: 98.3 kg 87.9 kg 89.4 kg    Telemetry    Sinus rhythm- Personally Reviewed  ECG    No new tracings- Personally Reviewed  Physical Exam   GEN: No acute distress.   Neck: No JVD Cardiac: RRR, no murmurs, rubs, or gallops.  Respiratory:  Poor air movement with diffuse wheezing GI: Soft, nontender, non-distended  MS:  Trivial bilateral peri-ankle and dorsal pedal edema; No deformity. Neuro:  Nonfocal  Psych: Normal affect   Labs    Chemistry Recent Labs  Lab 08/11/18 0458  08/13/18 0423 08/14/18 0409 08/15/18 0258  NA 141   < > 139 142 141  K 4.3   < > 4.5 4.2 4.4  CL 90*   < > 93* 96* 99  CO2 38*   < > 36* 35* 34*  GLUCOSE 220*   < > 159* 146* 183*  BUN 10   < > 22 20 26*  CREATININE 0.78   < > 0.85 0.93 0.99  CALCIUM 9.3   < > 9.3 8.4* 8.8*  PROT 6.4*  --   --   --   --   ALBUMIN 3.3*  --   --   --   --  AST 35  --   --   --   --   ALT 35  --   --   --   --   ALKPHOS 55  --   --   --   --   BILITOT 0.5  --   --   --   --   GFRNONAA >60   < > >60 >60 60*  GFRAA >60   < > >60 >60 >60  ANIONGAP 13   < > 10 11 8    < > = values in this interval not displayed.     Hematology Recent Labs  Lab 08/13/18 0423 08/14/18 0409 08/15/18 0258  WBC 19.5* 17.9* 13.5*  RBC 4.29 4.57 4.11  HGB 13.1 13.8 12.5  HCT 42.7 45.1 40.5  MCV 99.5 98.7 98.5  MCH 30.5 30.2 30.4  MCHC 30.7 30.6 30.9  RDW 14.7 14.5 14.6  PLT 276 272 250    Cardiac Enzymes Recent Labs  Lab 08/12/18 0942 08/12/18 1555 08/12/18 2125  TROPONINI 0.50* 0.50* 0.42*    Recent Labs  Lab 08/10/18 1804  TROPIPOC 0.01     BNP Recent Labs  Lab 08/10/18 1735  BNP 32.0     DDimer No results for input(s): DDIMER in the last 168 hours.   Radiology    Dg Chest Port 1 View  Result Date: 08/13/2018 CLINICAL DATA:  Respiratory distress. EXAM: PORTABLE CHEST 1 VIEW COMPARISON:  08/10/2018 FINDINGS: The heart  size and mediastinal contours are within normal limits. Both lungs are clear. The visualized skeletal structures are unremarkable. IMPRESSION: No active disease. Electronically Signed   By: Kathreen Devoid   On: 08/13/2018 15:38    Cardiac Studies   Echocardiogram 08/11/2018:  Left ventricle: The cavity size was normal. Wall thickness was increased in a pattern of mild LVH. The estimated ejection fraction was 55%. There is hypokinesis of the mid-apicalanteroseptal myocardium. Indeterminate diastolic function. - Aortic valve: Mildly to moderately calcified annulus. Trileaflet; mildly calcified leaflets. Mean gradient (S): 10 mm Hg. Peak gradient (S): 20 mm Hg. Moderately sclerotic to mildly stenotic aortic valve. VTI ratio of LVOT to aortic valve: 0.58. - Mitral valve: There was trivial regurgitation. - Right atrium: Central venous pressure (est): 3 mm Hg. - Atrial septum: No defect or patent foramen ovale was identified. - Tricuspid valve: There was trivial regurgitation. - Pulmonary arteries: Systolic pressure could not be accurately estimated. - Pericardium, extracardiac: A prominent pericardial fat pad was present   Patient Profile     62 y.o. female with a hx ofCOPDand currently being treated for a COPD exacerbation, hyperlipidemia, hypertension,T11 compression fracture and chronic pain,and tobacco abuse who is being seen today for the evaluation ofchest painat the request of Dr. Wynetta Emery.  Assessment & Plan    1. Non-STEMI:  She is symptomatically improved today and does not complain of chest pain and throat pain with exertion.  She has been transferred from [facility] to [facility].  Coronary angiography has now been scheduled for 08/16/2018. Troponins peaked at 0.5 and most recent value was 0.42. Continue aspirin, heparin, atorvastatin, and metoprolol. She did have coronary atherosclerosis by chest CT in June 3244 and she certainly has several  cardiovascular risk factors. Left ventricular systolic function was normal but she did have a wall motion abnormalityas reviewed above.  2. Acute COPD exacerbation: She is on IV Solu-Medrol and Augmentin. She is also on Dulera and albuterol nebs.  3. Hypertension: Blood pressure today is  mildly  elevated today and was normal yesterday. She is on metoprolol.If blood pressure becomes persistently elevated, an ACEI/ARB can be added.  4. Hyperlipidemia:Now on atorvastatin(I started 8/15). Lipid panel 10/13/2017 showed total cholesterol 204, HDL 59, triglycerides 162, LDL 117.  5. Bilateral leg edema: Currently on alternating doses of Lasix 40 and 80 mg.  BUN is mildly elevated to 26.  Continue monitoring renal function.  Creatinine is normal.  6. Tobacco abuse: She needs cessation.  For questions or updates, please contact [place] Please consult [email or web address] for contact info under Cardiology/STEMI.      Signed, Kate Sable, MD  08/15/2018, 11:59 AM

## 2018-08-15 NOTE — Progress Notes (Signed)
Patient requesting medication for cough, Robitussin given as ordered as needed for cough. Victorine Mcnee, Bettina Gavia RN

## 2018-08-16 ENCOUNTER — Encounter (HOSPITAL_COMMUNITY)
Admission: EM | Disposition: A | Discharge status: Home Health | Payer: Self-pay | Source: Home / Self Care | Attending: Family Medicine

## 2018-08-16 DIAGNOSIS — I2510 Atherosclerotic heart disease of native coronary artery without angina pectoris: Secondary | ICD-10-CM

## 2018-08-16 HISTORY — PX: LEFT HEART CATH AND CORONARY ANGIOGRAPHY: CATH118249

## 2018-08-16 LAB — CBC
HEMATOCRIT: 41.7 % (ref 36.0–46.0)
HEMOGLOBIN: 12.7 g/dL (ref 12.0–15.0)
MCH: 30 pg (ref 26.0–34.0)
MCHC: 30.5 g/dL (ref 30.0–36.0)
MCV: 98.3 fL (ref 78.0–100.0)
PLATELETS: 266 10*3/uL (ref 150–400)
RBC: 4.24 MIL/uL (ref 3.87–5.11)
RDW: 14.4 % (ref 11.5–15.5)
WBC: 16.6 10*3/uL — ABNORMAL HIGH (ref 4.0–10.5)

## 2018-08-16 LAB — HEPARIN LEVEL (UNFRACTIONATED): Heparin Unfractionated: 0.43 IU/mL (ref 0.30–0.70)

## 2018-08-16 LAB — BASIC METABOLIC PANEL
ANION GAP: 8 (ref 5–15)
BUN: 19 mg/dL (ref 8–23)
CALCIUM: 8.9 mg/dL (ref 8.9–10.3)
CO2: 34 mmol/L — AB (ref 22–32)
Chloride: 97 mmol/L — ABNORMAL LOW (ref 98–111)
Creatinine, Ser: 0.94 mg/dL (ref 0.44–1.00)
GFR calc non Af Amer: >60 mL/min (ref >60)
GLUCOSE: 150 mg/dL — AB (ref 70–99)
POTASSIUM: 4.3 mmol/L (ref 3.5–5.1)
Sodium: 139 mmol/L (ref 135–145)

## 2018-08-16 LAB — GLUCOSE, CAPILLARY
GLUCOSE-CAPILLARY: 149 mg/dL — AB (ref 70–99)
Glucose-Capillary: 214 mg/dL — ABNORMAL HIGH (ref 70–99)
Glucose-Capillary: 96 mg/dL (ref 70–99)
Glucose-Capillary: 127 mg/dL — ABNORMAL HIGH (ref 70–99)

## 2018-08-16 SURGERY — LEFT HEART CATH AND CORONARY ANGIOGRAPHY
Anesthesia: LOCAL

## 2018-08-16 MED ORDER — MIDAZOLAM HCL 2 MG/2ML IJ SOLN
INTRAMUSCULAR | Status: DC | PRN
  Administered 2018-08-16: 0.5 mg via INTRAVENOUS

## 2018-08-16 MED ORDER — LIDOCAINE HCL (PF) 1 % IJ SOLN
INTRAMUSCULAR | Status: DC | PRN
  Administered 2018-08-16: 2 mL

## 2018-08-16 MED ORDER — HEPARIN (PORCINE) IN NACL 100-0.45 UNIT/ML-% IJ SOLN
900.0000 [IU]/h | INTRAMUSCULAR | Status: DC
  Administered 2018-08-16 – 2018-08-19 (×2): 900 [IU]/h via INTRAVENOUS
  Filled 2018-08-16 (×3): qty 250

## 2018-08-16 MED ORDER — HEPARIN (PORCINE) IN NACL 1000-0.9 UT/500ML-% IV SOLN
INTRAVENOUS | Status: AC
  Filled 2018-08-16: qty 500

## 2018-08-16 MED ORDER — HEPARIN (PORCINE) IN NACL 1000-0.9 UT/500ML-% IV SOLN
INTRAVENOUS | Status: DC | PRN
  Administered 2018-08-16 (×2): 500 mL

## 2018-08-16 MED ORDER — FENTANYL CITRATE (PF) 100 MCG/2ML IJ SOLN
INTRAMUSCULAR | Status: AC
  Filled 2018-08-16: qty 2

## 2018-08-16 MED ORDER — LIDOCAINE HCL (PF) 1 % IJ SOLN
INTRAMUSCULAR | Status: AC
  Filled 2018-08-16: qty 30

## 2018-08-16 MED ORDER — VERAPAMIL HCL 2.5 MG/ML IV SOLN
INTRAVENOUS | Status: DC | PRN
  Administered 2018-08-16: 10 mL via INTRA_ARTERIAL

## 2018-08-16 MED ORDER — MIDAZOLAM HCL 2 MG/2ML IJ SOLN
INTRAMUSCULAR | Status: AC
  Filled 2018-08-16: qty 2

## 2018-08-16 MED ORDER — SODIUM CHLORIDE 0.9 % IV SOLN: 250.0000 mL | INTRAVENOUS | Status: DC | PRN

## 2018-08-16 MED ORDER — SODIUM CHLORIDE 0.9% FLUSH: 3.0000 mL | Freq: Two times a day (BID) | INTRAVENOUS | Status: DC

## 2018-08-16 MED ORDER — HEPARIN SODIUM (PORCINE) 1000 UNIT/ML IJ SOLN
INTRAMUSCULAR | Status: DC | PRN
  Administered 2018-08-16: 4000 [IU] via INTRAVENOUS

## 2018-08-16 MED ORDER — SODIUM CHLORIDE 0.9% FLUSH
3.0000 mL | Freq: Two times a day (BID) | INTRAVENOUS | Status: DC
  Administered 2018-08-16 – 2018-08-18 (×2): 3 mL via INTRAVENOUS

## 2018-08-16 MED ORDER — IOHEXOL 350 MG/ML SOLN
INTRAVENOUS | Status: DC | PRN
  Administered 2018-08-16: 50 mL via INTRAVENOUS

## 2018-08-16 MED ORDER — FUROSEMIDE 10 MG/ML IJ SOLN
40.0000 mg | Freq: Two times a day (BID) | INTRAMUSCULAR | Status: DC
  Administered 2018-08-16: 40 mg via INTRAVENOUS
  Filled 2018-08-16: qty 4

## 2018-08-16 MED ORDER — TICAGRELOR 90 MG PO TABS
180.0000 mg | ORAL_TABLET | Freq: Once | ORAL | Status: AC
  Administered 2018-08-16: 180 mg via ORAL
  Filled 2018-08-16: qty 2

## 2018-08-16 MED ORDER — TICAGRELOR 90 MG PO TABS
90.0000 mg | ORAL_TABLET | Freq: Two times a day (BID) | ORAL | Status: DC
  Administered 2018-08-17 – 2018-08-21 (×10): 90 mg via ORAL
  Filled 2018-08-16 (×9): qty 1

## 2018-08-16 MED ORDER — FENTANYL CITRATE (PF) 100 MCG/2ML IJ SOLN
INTRAMUSCULAR | Status: DC | PRN
  Administered 2018-08-16: 25 ug via INTRAVENOUS

## 2018-08-16 MED ORDER — LORAZEPAM 0.5 MG PO TABS
0.5000 mg | ORAL_TABLET | Freq: Once | ORAL | Status: AC
  Administered 2018-08-16: 0.5 mg via ORAL
  Filled 2018-08-16: qty 1

## 2018-08-16 MED ORDER — SODIUM CHLORIDE 0.9% FLUSH: 3.0000 mL | INTRAVENOUS | Status: DC | PRN

## 2018-08-16 MED ORDER — SODIUM CHLORIDE 0.9 % IV SOLN
INTRAVENOUS | Status: DC
  Administered 2018-08-16 (×2): via INTRAVENOUS

## 2018-08-16 MED ORDER — HEPARIN SODIUM (PORCINE) 1000 UNIT/ML IJ SOLN
INTRAMUSCULAR | Status: AC
  Filled 2018-08-16: qty 1

## 2018-08-16 MED ORDER — ASPIRIN 81 MG PO CHEW
81.0000 mg | CHEWABLE_TABLET | ORAL | Status: AC
  Administered 2018-08-16: 81 mg via ORAL
  Filled 2018-08-16: qty 1

## 2018-08-16 MED ORDER — VERAPAMIL HCL 2.5 MG/ML IV SOLN
INTRAVENOUS | Status: AC
  Filled 2018-08-16: qty 2

## 2018-08-16 SURGICAL SUPPLY — 12 items
CATH INFINITI 5FR MULTPACK ANG (CATHETERS) ×1 IMPLANT
DEVICE RAD COMP TR BAND LRG (VASCULAR PRODUCTS) ×1 IMPLANT
ELECT DEFIB PAD ADLT CADENCE (PAD) ×1 IMPLANT
GLIDESHEATH SLEND SS 6F .021 (SHEATH) ×1 IMPLANT
GUIDEWIRE INQWIRE 1.5J.035X260 (WIRE) IMPLANT
INQWIRE 1.5J .035X260CM (WIRE) ×2
KIT HEART LEFT (KITS) ×2 IMPLANT
PACK CARDIAC CATHETERIZATION (CUSTOM PROCEDURE TRAY) ×2 IMPLANT
SHIELD RADPAD SCOOP 12X17 (MISCELLANEOUS) ×1 IMPLANT
TRANSDUCER W/STOPCOCK (MISCELLANEOUS) ×2 IMPLANT
TUBING CIL FLEX 10 FLL-RA (TUBING) ×2 IMPLANT
WIRE HI TORQ VERSACORE-J 145CM (WIRE) ×1 IMPLANT

## 2018-08-16 NOTE — Progress Notes (Signed)
Patient is currently on [facility] with sats of 96%. Patient is in no distress at this time and all vitals are stable. BIPAP not needed at this time. Will continue to monitor.

## 2018-08-16 NOTE — Progress Notes (Signed)
Pt received from cath lab. VSS. TR band with 12cc of air. L hand with distal pulses. Pt educated on Tr band. Dinner tray ordered. Will continue to monitor.  Clyde Canterbury, RN

## 2018-08-16 NOTE — Progress Notes (Signed)
Progress Note  Patient Name: Cindy [name] Date of Encounter: 08/16/2018  Primary Cardiologist: No primary care provider on file.   Subjective   She stated that her breathing felt better this AM, but she had to urgently use the bathroom. No chest pain.   Inpatient Medications    Scheduled Meds: . amoxicillin-clavulanate  1 tablet Oral Q12H  . aspirin EC  81 mg Oral q morning - 10a  . atorvastatin  40 mg Oral q1800  . calcium-vitamin D  1 tablet Oral BID  . furosemide  40 mg Oral QODAY  . furosemide  80 mg Oral QODAY  . insulin aspart  0-20 Units Subcutaneous TID WC  . insulin aspart  0-5 Units Subcutaneous QHS  . insulin aspart  6 Units Subcutaneous TID WC  . ipratropium-albuterol  3 mL Nebulization TID  . methylPREDNISolone (SOLU-MEDROL) injection  60 mg Intravenous Q12H  . metoprolol tartrate  25 mg Oral BID  . mometasone-formoterol  2 puff Inhalation BID  . nicotine  21 mg Transdermal Daily  . pantoprazole  40 mg Oral BID  . pneumococcal 23 valent vaccine  0.5 mL Intramuscular Tomorrow-1000  . polyethylene glycol  17 g Oral Daily  . sodium chloride flush  3 mL Intravenous Q12H  . sodium chloride flush  3 mL Intravenous Q12H   Continuous Infusions: . sodium chloride    . sodium chloride    . sodium chloride 100 mL/hr at 08/16/18 0551  . heparin 900 Units/hr (08/16/18 0600)   PRN Meds: sodium chloride, sodium chloride, acetaminophen (TYLENOL) oral liquid 160 mg/5 mL, albuterol, alum & mag hydroxide-simeth, diphenhydrAMINE, famotidine, guaiFENesin-dextromethorphan, hydrALAZINE, labetalol, ondansetron **OR** ondansetron (ZOFRAN) IV, oxyCODONE-acetaminophen, sodium chloride, sodium chloride flush, sodium chloride flush   Vital Signs    Vitals:   08/16/18 0417 08/16/18 0731 08/16/18 0913 08/16/18 0953  BP: (!) 150/100  140/87   Pulse: 88  (!) 106 (!) 103  Resp: 16  (!) 24   Temp: 97.7 F (36.5 C)  97.6 F (36.4 C)   TempSrc: Oral  Oral   SpO2: 100% 97% 92%     Weight: 88.8 kg     Height:        Intake/Output Summary (Last 24 hours) at 08/16/2018 1105 Last data filed at 08/16/2018 0914 Gross per 24 hour  Intake 418.8 ml  Output 1450 ml  Net -1031.2 ml   Filed Weights   08/14/18 0300 08/15/18 0309 08/16/18 0417  Weight: 87.9 kg 89.4 kg 88.8 kg    Telemetry    Sinus rhythm- Personally Reviewed  ECG    No new tracings- Personally Reviewed  Physical Exam   GEN: No acute distress.   Neck: No appreciable JVD but difficult due to body habitus Cardiac: RRR, no murmurs, rubs, or gallops.  Respiratory:  Poor air movement with diffuse wheezing GI: Soft, nontender, non-distended  MS:  1+ bilateral pedal edema; No deformity. Neuro:  Nonfocal  Psych: Normal affect   Labs    Chemistry Recent Labs  Lab 08/11/18 0458  08/14/18 0409 08/15/18 0258 08/16/18 0431  NA 141   < > 142 141 139  K 4.3   < > 4.2 4.4 4.3  CL 90*   < > 96* 99 97*  CO2 38*   < > 35* 34* 34*  GLUCOSE 220*   < > 146* 183* 150*  BUN 10   < > 20 26* 19  CREATININE 0.78   < > 0.93 0.99 0.94  CALCIUM  9.3   < > 8.4* 8.8* 8.9  PROT 6.4*  --   --   --   --   ALBUMIN 3.3*  --   --   --   --   AST 35  --   --   --   --   ALT 35  --   --   --   --   ALKPHOS 55  --   --   --   --   BILITOT 0.5  --   --   --   --   GFRNONAA >60   < > >60 60* >60  GFRAA >60   < > >60 >60 >60  ANIONGAP 13   < > 11 8 8    < > = values in this interval not displayed.     Hematology Recent Labs  Lab 08/14/18 0409 08/15/18 0258 08/16/18 0431  WBC 17.9* 13.5* 16.6*  RBC 4.57 4.11 4.24  HGB 13.8 12.5 12.7  HCT 45.1 40.5 41.7  MCV 98.7 98.5 98.3  MCH 30.2 30.4 30.0  MCHC 30.6 30.9 30.5  RDW 14.5 14.6 14.4  PLT 272 250 266    Cardiac Enzymes Recent Labs  Lab 08/12/18 0942 08/12/18 1555 08/12/18 2125  TROPONINI 0.50* 0.50* 0.42*    Recent Labs  Lab 08/10/18 1804  TROPIPOC 0.01     BNP Recent Labs  Lab 08/10/18 1735  BNP 32.0     DDimer No results for input(s):  DDIMER in the last 168 hours.   Radiology    No results found.  Cardiac Studies   Echocardiogram 08/11/2018:  Left ventricle: The cavity size was normal. Wall thickness was increased in a pattern of mild LVH. The estimated ejection fraction was 55%. There is hypokinesis of the mid-apicalanteroseptal myocardium. Indeterminate diastolic function. - Aortic valve: Mildly to moderately calcified annulus. Trileaflet; mildly calcified leaflets. Mean gradient (S): 10 mm Hg. Peak gradient (S): 20 mm Hg. Moderately sclerotic to mildly stenotic aortic valve. VTI ratio of LVOT to aortic valve: 0.58. - Mitral valve: There was trivial regurgitation. - Right atrium: Central venous pressure (est): 3 mm Hg. - Atrial septum: No defect or patent foramen ovale was identified. - Tricuspid valve: There was trivial regurgitation. - Pulmonary arteries: Systolic pressure could not be accurately estimated. - Pericardium, extracardiac: A prominent pericardial fat pad was present   Patient Profile     62 y.o. female with a hx ofCOPDand currently being treated for a COPD exacerbation, hyperlipidemia, hypertension,T11 compression fracture and chronic pain,and tobacco abuse who is being seen today for the evaluation ofchest painat the request of Dr. Wynetta Emery.  Assessment & Plan    1. Non-STEMI:   ADDENDED: Cath results are as follows: 1. Significant 2-vessel coronary artery disease, including 90% ostial and 60-70% large OM2 stenoses.  Both lesions are heavily calcified. 2. Mild diffuse disease involving the RCA. 3. Mildly reduced left ventricular systolic function with anterior hypokinesis (LVEF 45-50%). 4. Moderately elevated left ventricular filling pressure (LVEDP 25-30 mmHg).  Recommendations: 1. Optimize volume status, as patient has moderately elevated LVEDP and cannot lie flat.  Will change furosemide to 40 mg IV BID.  Further titration may be needed based on urine  output. 2. Once patient has been optimized, consider PCI to ostial/proximal LAD using atherectomy.  Given poor function status and oxygen-dependent COPD, she is not a good surgical candidate. 3. Medical therapy for 60-70% OM2 stenosis. 4. Aggressive secondary prevention.  Will load with ticagrelor.  Recommend uninterrupted  dual antiplatelet therapy with Aspirin 81mg  daily and Ticagrelor 90mg  twice daily for a minimum of 12 months (ACS - Class I recommendation).   2. Acute COPD exacerbation: She is on IV Solu-Medrol and Augmentin. She is also on Dulera and albuterol nebs.  3. Hypertension: variable control, will continue to monitor.  4. Hyperlipidemia:Continue atorvastatin  5. Bilateral leg edema: increased lasix today given cath findings.   6. Tobacco abuse: She needs cessation.  For questions or updates, please contact [place] Please consult [email or web address] for contact info under Cardiology/STEMI.      Signed, Buford Dresser, MD  08/16/2018, 11:05 AM

## 2018-08-16 NOTE — Progress Notes (Signed)
PROGRESS NOTE    Cindy [name]  YQI:[id number] DOB: Dec 14, 1956 DOA: 08/10/2018 PCP: Alycia Rossetti, MD      Brief Narrative:  Cindy [name] is a 61 y.o. F with COPD on home O2, smoking, dCHF, diabetes who presents with progressive shortness of breath and leg swelling, initial troponin normal, admitted for COPD flare.  While in the hospital, noted to have exertional chest discomfort, troponins cycled and found to have NSTEMI and transferred to [facility] for LHC.      Assessment & Plan:  NSTEMI -Defer heparin/ticagrelor cardiology -Continue aspirin, atorvastatin, metoprolol -If plan for ACEi/ARB, consider ARB given COPD/chronic cough -Consult cardiology, appreciate expert cares  Acute on chronic systolic CHF Found to have E 40% and orthopnea and elevated EDP during LHC today. -Furosemide 40 mg IV twice a day  -K supplement -Strict I/Os, daily weights, telemetry  -Daily monitoring renal function  COPD exacerbation Still extremely wheezy.  Stable on home O2 though. -Continue Solu-Medrol twice daily -Continue PPI -Continue nebulized antibiotics, scheduled and as needed -Continue home ICS/LABA -Continue Augmentin  -She may have Robitussin, acetaminophen as needed   Acute on chronic diastolic CHF EF 38% this hospitalization. -Continue home oral Lasix, alternating daily dose -Daily BMP -Continue strict ins and outs, daily weights  Diabetes Glucose control fair -Continue mealtime insulin -Continue SSI corrections  LE edema DVT study negative of both legs.  Smoking Cessation recommended, modalities discussed  GERD -Continue PPI -H2RA PRN    DVT prophylaxis: Not applicable, on heparin Code Status: Full code Family Communication: Husband at the bedside MDM and disposition Plan: The below labs and imaging reports were reviewed and summarized above.  Medication management as above.  The patient was admitted with a COPD flare leg swelling.  She was found to have  exertional chest discomfort, and NSTEMI.    During left heart cath today, she was found to have elevated EDP, orthopnea, reduced ejection fraction, so Lasix will be started to treat fluid overload.  She has severe oxygen dependent COPD, complicating NSTEMI, which is resulted in acute systolic congestive heart failure.  She will require continued IV heparin, IV Lasix, close monitoring of electrolytes.      Acute coronary syndrome (MI, NSTEMI, STEMI, etc) this admission?: Yes.     AHA/ACC Clinical Performance & Quality Measures: 1. Aspirin prescribed? - yes 2. ADP Receptor Inhibitor (Plavix/Clopidogrel, Brilinta/Ticagrelor or Effient/Prasugrel) prescribed (includes medically managed patients)? - Ticagrelor 3. Beta Blocker prescribed? - metoprolol 4. High Intensity Statin (Lipitor 40-80mg  or Crestor 20-40mg ) prescribed? - atorvastatin 40 5. EF assessed during THIS hospitalization? - pending 6. For EF <40%, was ACEI/ARB prescribed? - ARB 7. For EF <40%, Aldosterone Antagonist (Spironolactone or Eplerenone) prescribed? - per Cardiology 8. Cardiac Rehab Phase II ordered (Included Medically managed Patients)? - per Cardiology           Consultants:   Cardiology  Procedures:   Echo 8/14 LV EF: 55%  ------------------------------------------------------------------- History:   PMH:  Acquired from the patient and from the patient&'s chart.  Chronic obstructive pulmonary disease.  PMH:  GERD. Tobacco abuse.  Risk factors:  Dyslipidemia.  ------------------------------------------------------------------- Study Conclusions  - Left ventricle: The cavity size was normal. Wall thickness was   increased in a pattern of mild LVH. The estimated ejection   fraction was 55%. There is hypokinesis of the   mid-apicalanteroseptal myocardium. Indeterminate diastolic   function. - Aortic valve: Mildly to moderately calcified annulus. Trileaflet;   mildly calcified leaflets. Mean  gradient (S): 10 mm  Hg. Peak   gradient (S): 20 mm Hg. Moderately sclerotic to mildly stenotic   aortic valve. VTI ratio of LVOT to aortic valve: 0.58. - Mitral valve: There was trivial regurgitation. - Right atrium: Central venous pressure (est): 3 mm Hg. - Atrial septum: No defect or patent foramen ovale was identified. - Tricuspid valve: There was trivial regurgitation. - Pulmonary arteries: Systolic pressure could not be accurately   estimated. - Pericardium, extracardiac: A prominent pericardial fat pad was   present.   DVT LE8/14 FINDINGS: RIGHT LOWER EXTREMITY  Common Femoral Vein: No evidence of thrombus. Normal compressibility, respiratory phasicity and response to augmentation.  Saphenofemoral Junction: No evidence of thrombus. Normal compressibility and flow on color Doppler imaging.  Profunda Femoral Vein: No evidence of thrombus. Normal compressibility and flow on color Doppler imaging.  Femoral Vein: No evidence of thrombus. Normal compressibility, respiratory phasicity and response to augmentation.  Popliteal Vein: No evidence of thrombus. Normal compressibility, respiratory phasicity and response to augmentation.  Calf Veins: No evidence of thrombus. Normal compressibility and flow on color Doppler imaging.  Superficial Great Saphenous Vein: No evidence of thrombus. Normal compressibility.  Venous Reflux:  None.  Other Findings:  None.  LEFT LOWER EXTREMITY  Common Femoral Vein: No evidence of thrombus. Normal compressibility, respiratory phasicity and response to augmentation.  Saphenofemoral Junction: No evidence of thrombus. Normal compressibility and flow on color Doppler imaging.  Profunda Femoral Vein: No evidence of thrombus. Normal compressibility and flow on color Doppler imaging.  Femoral Vein: No evidence of thrombus. Normal compressibility, respiratory phasicity and response to augmentation.  Popliteal Vein: No  evidence of thrombus. Normal compressibility, respiratory phasicity and response to augmentation.  Calf Veins: No evidence of thrombus. Normal compressibility and flow on color Doppler imaging.  Superficial Great Saphenous Vein: No evidence of thrombus. Normal compressibility.  Venous Reflux:  None.  Other Findings: There is a serpiginous approximately 5.7 x 6.2 x 2.7 cm fluid collection within the left popliteal fossa compatible with a Baker cyst, grossly unchanged compared to the 04/2018 examination, previously, 5.9 cm  IMPRESSION: 1. No evidence of DVT within either lower extremity. 2. 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   Electronically Signed   By: Sandi Mariscal M.D.   On: 08/11/2018 11:12     Antimicrobials:   Augmentin 8/15 >>     Subjective: Very wheezy, coughing still.  Her heartburn and neck discomfort with ambulation have improved.  She still very dyspneic with walking just a few feet.  Fever, sputum production.  No confusion.  Objective: Vitals:   08/16/18 1447 08/16/18 1452 08/16/18 1457 08/16/18 1526  BP: (!) 144/89 (!) 151/93 (!) 151/93 (!) 143/81  Pulse: 89 93 93 91  Resp: 11 15 (!) 8 20  Temp:    98.5 F (36.9 C)  TempSrc:    Oral  SpO2: 100% 100% 95% 96%  Weight:      Height:        Intake/Output Summary (Last 24 hours) at 08/16/2018 1826 Last data filed at 08/16/2018 1755 Gross per 24 hour  Intake 476.69 ml  Output 950 ml  Net -473.31 ml   Filed Weights   08/14/18 0300 08/15/18 0309 08/16/18 0417  Weight: 87.9 kg 89.4 kg 88.8 kg    Examination: General appearance: Adult female, lying in bed, no acute distress, interactive HEENT: Anicteric, conjunctival pink, lids and lashes normal.  No nasal deformity, discharge, epistaxis.  Lips moist, dentition poor, oropharynx moist, no oral  lesions, hearing normal. Cardiac: RRR, no murmurs, JVP not visible due to body habitus, no pitting lower  extremity edema. Respiratory: Mild tachypnea, no accessory muscle use, out of breath at rest, wheezing throughout.  No rales. Abdomen: Abdomen soft without tenderness to palpation, ascites or distention. MSK: No deformities or effusions. Neuro: Awake and alert, extraocular movements intact, face symmetric, moves all extremities with 5 strength, normal coordination, speech fluent. Psych: Sensorium intact responding to questions, attention normal, affect normal.  Judgment and insight appear slightly impaired due to baseline cognitive function.    Data Reviewed: I have personally reviewed following labs and imaging studies:  CBC: Recent Labs  Lab 08/10/18 1734 08/11/18 0458 08/13/18 0423 08/14/18 0409 08/15/18 0258 08/16/18 0431  WBC 16.4* 17.2* 19.5* 17.9* 13.5* 16.6*  NEUTROABS 12.9 15.7*  --   --   --   --   HGB 13.1 12.2 13.1 13.8 12.5 12.7  HCT 42.8 40.5 42.7 45.1 40.5 41.7  MCV 100.5* 100.2* 99.5 98.7 98.5 98.3  PLT 269 290 276 272 250 503   Basic Metabolic Panel: Recent Labs  Lab 08/10/18 1734  08/12/18 0438 08/12/18 1143 08/13/18 0423 08/14/18 0409 08/15/18 0258 08/16/18 0431  NA 142   < > 139 140 139 142 141 139  K 3.4*   < > 5.2* 4.6 4.5 4.2 4.4 4.3  CL 92*   < > 92* 92* 93* 96* 99 97*  CO2 42*   < > 39* 39* 36* 35* 34* 34*  GLUCOSE 179*   < > 180* 120* 159* 146* 183* 150*  BUN 7*   < > 18 18 22 20  26* 19  CREATININE 0.81   < > 0.77 0.80 0.85 0.93 0.99 0.94  CALCIUM 9.1   < > 9.2 9.3 9.3 8.4* 8.8* 8.9  MG 2.0  --  2.3  --   --  2.2  --   --   PHOS 2.9  --   --   --   --   --   --   --    < > = values in this interval not displayed.   GFR: Estimated Creatinine Clearance: 62.9 mL/min (by C-G formula based on SCr of 0.94 mg/dL). Liver Function Tests: Recent Labs  Lab 08/11/18 0458  AST 35  ALT 35  ALKPHOS 55  BILITOT 0.5  PROT 6.4*  ALBUMIN 3.3*   No results for input(s): LIPASE, AMYLASE in the last 168 hours. No results for input(s): AMMONIA in the  last 168 hours. Coagulation Profile: Recent Labs  Lab 08/12/18 1143  INR 0.96   Cardiac Enzymes: Recent Labs  Lab 08/12/18 0942 08/12/18 1555 08/12/18 2125  TROPONINI 0.50* 0.50* 0.42*   BNP (last 3 results) No results for input(s): PROBNP in the last 8760 hours. HbA1C: No results for input(s): HGBA1C in the last 72 hours. CBG: Recent Labs  Lab 08/15/18 1638 08/15/18 2124 08/16/18 0642 08/16/18 1106 08/16/18 1613  GLUCAP 81 172* 149* 214* 96   Lipid Profile: No results for input(s): CHOL, HDL, LDLCALC, TRIG, CHOLHDL, LDLDIRECT in the last 72 hours. Thyroid Function Tests: No results for input(s): TSH, T4TOTAL, FREET4, T3FREE, THYROIDAB in the last 72 hours. Anemia Panel: No results for input(s): VITAMINB12, FOLATE, FERRITIN, TIBC, IRON, RETICCTPCT in the last 72 hours. Urine analysis:    Component Value Date/Time   COLORURINE STRAW (A) 06/04/2018 1340   APPEARANCEUR CLEAR 06/04/2018 1340   LABSPEC 1.004 (L) 06/04/2018 1340   PHURINE 9.0 (H) 06/04/2018 1340  GLUCOSEU NEGATIVE 06/04/2018 1340   HGBUR NEGATIVE 06/04/2018 1340   BILIRUBINUR NEGATIVE 06/04/2018 1340   [place] 06/04/2018 1340   PROTEINUR NEGATIVE 06/04/2018 1340   UROBILINOGEN 0.2 04/15/2014 1949   NITRITE NEGATIVE 06/04/2018 1340   LEUKOCYTESUR NEGATIVE 06/04/2018 1340   Sepsis Labs: @LABRCNTIP (procalcitonin:4,lacticacidven:4)  )No results found for this or any previous visit (from the past 240 hour(s)).       Radiology Studies: No results found.      Scheduled Meds: . amoxicillin-clavulanate  1 tablet Oral Q12H  . aspirin EC  81 mg Oral q morning - 10a  . atorvastatin  40 mg Oral q1800  . calcium-vitamin D  1 tablet Oral BID  . furosemide  40 mg Intravenous BID  . insulin aspart  0-20 Units Subcutaneous TID WC  . insulin aspart  0-5 Units Subcutaneous QHS  . insulin aspart  6 Units Subcutaneous TID WC  . ipratropium-albuterol  3 mL Nebulization TID  .  methylPREDNISolone (SOLU-MEDROL) injection  60 mg Intravenous Q12H  . metoprolol tartrate  25 mg Oral BID  . mometasone-formoterol  2 puff Inhalation BID  . nicotine  21 mg Transdermal Daily  . pantoprazole  40 mg Oral BID  . pneumococcal 23 valent vaccine  0.5 mL Intramuscular Tomorrow-1000  . polyethylene glycol  17 g Oral Daily  . sodium chloride flush  3 mL Intravenous Q12H  . [START ON 08/17/2018] ticagrelor  90 mg Oral BID   Continuous Infusions: . sodium chloride    . heparin       LOS: 4 days    Time spent: 25 minutes    Edwin Dada, MD [facility] 08/16/2018, 6:26 PM     Pager [phone] --- please page though AMION:  [email or web address] If 7PM-7AM, please contact night-coverage

## 2018-08-16 NOTE — Progress Notes (Signed)
ANTICOAGULATION CONSULT NOTE  Pharmacy Consult for Heparin Indication: chest pain/ACS  Patient Measurements: Height: 5\' 1"  (154.9 cm) Weight: 195 lb 11.2 oz (88.8 kg)(Scale A) IBW/kg (Calculated) : 47.8 HEPARIN DW (KG): 68.8  Vital Signs: Temp: 97.7 F (36.5 C) (08/19 0417) Temp Source: Oral (08/19 0417) BP: 150/100 (08/19 0417) Pulse Rate: 88 (08/19 0417)  Labs: Recent Labs    08/14/18 0409 08/15/18 0258 08/16/18 0431  HGB 13.8 12.5 12.7  HCT 45.1 40.5 41.7  PLT 272 250 266  HEPARINUNFRC 0.46 0.30 0.43  CREATININE 0.93 0.99 0.94    Estimated Creatinine Clearance: 62.9 mL/min (by C-G formula based on SCr of 0.94 mg/dL).   Assessment: 62 yo female presenting with acute COPD exacerbation and chest pain. Troponin 0.01>0.5>0.42. Coronary angiography has been scheduled for 08/16/2018. Pharmacy consulted to manage heparin infusion for ACS.   Heparin level remains therapeutic on 900 units/hr. Hgb 12.7, plt 266. No bleeding reported.  Goal of Therapy:  Heparin level 0.3-0.7 units/ml Monitor platelets by anticoagulation protocol: Yes   Plan:  Continue IV heparin at 900 units/hr  Monitor daily HL, CBC, for s/sx of bleeding F/u after cath today   Thank you for involving pharmacy in this patient's care.  Albertina Parr, PharmD., BCPS Clinical Pharmacist Clinical phone for [phone] until 3:30pm: [phone] If after 3:30pm, please refer to [facility] for unit-specific pharmacist

## 2018-08-16 NOTE — Interval H&P Note (Signed)
History and Physical Interval Note:  08/16/2018 2:13 PM  Cindy [name]  has presented today for cardiac catheterization, with the diagnosis of NNSTEMI. The various methods of treatment have been discussed with the patient and family. After consideration of risks, benefits and other options for treatment, the patient has consented to  Procedure(s): LEFT HEART CATH AND CORONARY ANGIOGRAPHY (N/A) as a surgical intervention .  The patient's history has been reviewed, patient examined, no change in status, stable for surgery.  I have reviewed the patient's chart and labs.  Questions were answered to the patient's satisfaction.    Cath Lab Visit (complete for each Cath Lab visit)  Clinical Evaluation Leading to the Procedure:   ACS: Yes.    Non-ACS:  N/A  Filbert Craze

## 2018-08-16 NOTE — Care Management Important Message (Signed)
Important Message  Patient Details  Name: Cindy [name] MRN: [id number] Date of Birth: 1956-09-27   Medicare Important Message Given:  Yes    Lary Eckardt P [place] 08/16/2018, 1:51 PM

## 2018-08-16 NOTE — Progress Notes (Signed)
ANTICOAGULATION CONSULT NOTE  Pharmacy Consult for Heparin Indication: chest pain/ACS  Patient Measurements: Height: 5\' 1"  (154.9 cm) Weight: 195 lb 11.2 oz (88.8 kg)(Scale A) IBW/kg (Calculated) : 47.8 HEPARIN DW (KG): 68.8  Vital Signs: Temp: 98.5 F (36.9 C) (08/19 1526) Temp Source: Oral (08/19 1526) BP: 143/81 (08/19 1526) Pulse Rate: 91 (08/19 1526)  Labs: Recent Labs    08/14/18 0409 08/15/18 0258 08/16/18 0431  HGB 13.8 12.5 12.7  HCT 45.1 40.5 41.7  PLT 272 250 266  HEPARINUNFRC 0.46 0.30 0.43  CREATININE 0.93 0.99 0.94    Estimated Creatinine Clearance: 62.9 mL/min (by C-G formula based on SCr of 0.94 mg/dL).   Assessment: 62 yo female presenting with acute COPD exacerbation and chest pain. Troponin 0.01>0.5>0.42. Coronary angiography has been scheduled for 08/16/2018. Pharmacy consulted to manage heparin infusion for ACS.   Pt now s/p LHC, pharmacy consulted to resume IV heparin 2 hr after TR band removal (~1800) while awaiting consideration for PCI to prox LAD.  Goal of Therapy:  Heparin level 0.3-0.7 units/ml Monitor platelets by anticoagulation protocol: Yes   Plan:  Restart IV heparin at 900 units/hr at 2000 with no bolus Check 6hr heparin level  Arrie Senate, PharmD, BCPS Clinical Pharmacist [phone] Please check AMION for all [place] numbers 08/16/2018

## 2018-08-17 ENCOUNTER — Encounter (HOSPITAL_COMMUNITY): Payer: Self-pay | Admitting: Internal Medicine

## 2018-08-17 DIAGNOSIS — R0601 Orthopnea: Secondary | ICD-10-CM

## 2018-08-17 LAB — CBC
HEMATOCRIT: 42.2 % (ref 36.0–46.0)
HEMOGLOBIN: 13.3 g/dL (ref 12.0–15.0)
MCH: 30.2 pg (ref 26.0–34.0)
MCHC: 31.5 g/dL (ref 30.0–36.0)
MCV: 95.7 fL (ref 78.0–100.0)
Platelets: 262 10*3/uL (ref 150–400)
RBC: 4.41 MIL/uL (ref 3.87–5.11)
RDW: 14.3 % (ref 11.5–15.5)
WBC: 18.4 10*3/uL — AB (ref 4.0–10.5)

## 2018-08-17 LAB — BASIC METABOLIC PANEL
ANION GAP: 9 (ref 5–15)
BUN: 18 mg/dL (ref 8–23)
CHLORIDE: 99 mmol/L (ref 98–111)
CO2: 31 mmol/L (ref 22–32)
Calcium: 9.1 mg/dL (ref 8.9–10.3)
Creatinine, Ser: 0.93 mg/dL (ref 0.44–1.00)
GFR calc non Af Amer: >60 mL/min (ref >60)
Glucose, Bld: 133 mg/dL — ABNORMAL HIGH (ref 70–99)
Potassium: 3.6 mmol/L (ref 3.5–5.1)
Sodium: 139 mmol/L (ref 135–145)

## 2018-08-17 LAB — GLUCOSE, CAPILLARY
GLUCOSE-CAPILLARY: 110 mg/dL — AB (ref 70–99)
GLUCOSE-CAPILLARY: 159 mg/dL — AB (ref 70–99)
Glucose-Capillary: 107 mg/dL — ABNORMAL HIGH (ref 70–99)

## 2018-08-17 LAB — HEPARIN LEVEL (UNFRACTIONATED)
HEPARIN UNFRACTIONATED: 0.47 [IU]/mL (ref 0.30–0.70)
Heparin Unfractionated: 0.3 IU/mL (ref 0.30–0.70)

## 2018-08-17 MED ORDER — ALPRAZOLAM 0.5 MG PO TABS
0.5000 mg | ORAL_TABLET | Freq: Two times a day (BID) | ORAL | Status: DC | PRN
  Administered 2018-08-17: 0.5 mg via ORAL
  Filled 2018-08-17: qty 1

## 2018-08-17 MED ORDER — FUROSEMIDE 10 MG/ML IJ SOLN
40.0000 mg | Freq: Two times a day (BID) | INTRAMUSCULAR | Status: DC
  Administered 2018-08-17 – 2018-08-18 (×4): 40 mg via INTRAVENOUS
  Filled 2018-08-17 (×5): qty 4

## 2018-08-17 NOTE — Progress Notes (Signed)
PROGRESS NOTE    Cindy [name]  IFO:[id number] DOB: [birth year] DOA: 08/10/2018 PCP: Alycia Rossetti, MD      Brief Narrative:  Cindy [name] is a 62 y.o. F with COPD on home O2, smoking, dCHF, diabetes who presents with progressive shortness of breath and leg swelling, initial troponin normal, admitted for COPD flare.  While in the hospital, noted to have exertional chest discomfort, troponins cycled and found to have NSTEMI and transferred to [facility] for LHC.      Assessment & Plan:  NSTEMI -Defer heparin/ticagrelor cardiology -Continue aspirin, atorvastatin, metoprolol -If plan for ACEi/ARB, consider ARB given COPD/chronic cough -Consult cardiology, appreciate expert cares  Acute on chronic systolic CHF Found to have E 40% and orthopnea and elevated EDP during LHC on 8/19, started Lasix.  Net -2.7 L yesterday.  Creatinine stable -Continue Lasix, defer titration to cardiology -Continue potassium -Strict I/Os, daily weights, telemetry  -Daily monitoring renal function  COPD exacerbation No change to wheezing.  Stable on home O2 though. -Continue Solu-Medrol -Continue PPI -Continue nebulized bronchodilators, scheduled and as needed -Continue Augmentin, day 5 of 7 -Continue home ICS/LABA -She may have Robitussin, acetaminophen as needed  Diabetes Good -Continue mealtime insulin -Continue SSI corrections  LE edema DVT study negative of both legs.  Smoking Cessation recommended, modalities discussed  GERD -Continue PPI -H2RA PRN    DVT prophylaxis: Not applicable, on heparin Code Status: Full code Family Communication: None present MDM and disposition Plan: The below labs and imaging reports were reviewed and summarized above.  Medication management as above.  The patient was admitted with a working diagnosis of COPD flare and leg swelling.  She was found to have exertional chest discomfort, and NSTEMI.    During left heart cath 8/19, she was found to have  elevated EDP, orthopnea, reduced ejection fraction, so Lasix was started to treat fluid overload.  She has severe oxygen dependent COPD, complicating NSTEMI, which is resulted in acute systolic congestive heart failure.  She will require continued IV heparin, IV Lasix, close monitoring of electrolytes.  Cardiology notes suggest they may diurese until fluid status optimized and then consider PCI.      Acute coronary syndrome (MI, NSTEMI, STEMI, etc) this admission?: Yes.     AHA/ACC Clinical Performance & Quality Measures: 1. Aspirin prescribed? - yes 2. ADP Receptor Inhibitor (Plavix/Clopidogrel, Brilinta/Ticagrelor or Effient/Prasugrel) prescribed (includes medically managed patients)? - Ticagrelor 3. Beta Blocker prescribed? - metoprolol 4. High Intensity Statin (Lipitor 40-80mg  or Crestor 20-40mg ) prescribed? - atorvastatin 40 5. EF assessed during THIS hospitalization? - pending 6. For EF <40%, was ACEI/ARB prescribed? - ARB 7. For EF <40%, Aldosterone Antagonist (Spironolactone or Eplerenone) prescribed? - per Cardiology 8. Cardiac Rehab Phase II ordered (Included Medically managed Patients)? - per Cardiology           Consultants:   Cardiology  Procedures:   Echo 8/14 LV EF: 55%  ------------------------------------------------------------------- History:   PMH:  Acquired from the patient and from the patient&'s chart.  Chronic obstructive pulmonary disease.  PMH:  GERD. Tobacco abuse.  Risk factors:  Dyslipidemia.  ------------------------------------------------------------------- Study Conclusions  - Left ventricle: The cavity size was normal. Wall thickness was   increased in a pattern of mild LVH. The estimated ejection   fraction was 55%. There is hypokinesis of the   mid-apicalanteroseptal myocardium. Indeterminate diastolic   function. - Aortic valve: Mildly to moderately calcified annulus. Trileaflet;   mildly calcified leaflets. Mean gradient  (S): 10 mm Hg. Peak  gradient (S): 20 mm Hg. Moderately sclerotic to mildly stenotic   aortic valve. VTI ratio of LVOT to aortic valve: 0.58. - Mitral valve: There was trivial regurgitation. - Right atrium: Central venous pressure (est): 3 mm Hg. - Atrial septum: No defect or patent foramen ovale was identified. - Tricuspid valve: There was trivial regurgitation. - Pulmonary arteries: Systolic pressure could not be accurately   estimated. - Pericardium, extracardiac: A prominent pericardial fat pad was   present.   DVT LE8/14 FINDINGS: RIGHT LOWER EXTREMITY  Common Femoral Vein: No evidence of thrombus. Normal compressibility, respiratory phasicity and response to augmentation.  Saphenofemoral Junction: No evidence of thrombus. Normal compressibility and flow on color Doppler imaging.  Profunda Femoral Vein: No evidence of thrombus. Normal compressibility and flow on color Doppler imaging.  Femoral Vein: No evidence of thrombus. Normal compressibility, respiratory phasicity and response to augmentation.  Popliteal Vein: No evidence of thrombus. Normal compressibility, respiratory phasicity and response to augmentation.  Calf Veins: No evidence of thrombus. Normal compressibility and flow on color Doppler imaging.  Superficial Great Saphenous Vein: No evidence of thrombus. Normal compressibility.  Venous Reflux:  None.  Other Findings:  None.  LEFT LOWER EXTREMITY  Common Femoral Vein: No evidence of thrombus. Normal compressibility, respiratory phasicity and response to augmentation.  Saphenofemoral Junction: No evidence of thrombus. Normal compressibility and flow on color Doppler imaging.  Profunda Femoral Vein: No evidence of thrombus. Normal compressibility and flow on color Doppler imaging.  Femoral Vein: No evidence of thrombus. Normal compressibility, respiratory phasicity and response to augmentation.  Popliteal Vein: No evidence of  thrombus. Normal compressibility, respiratory phasicity and response to augmentation.  Calf Veins: No evidence of thrombus. Normal compressibility and flow on color Doppler imaging.  Superficial Great Saphenous Vein: No evidence of thrombus. Normal compressibility.  Venous Reflux:  None.  Other Findings: There is a serpiginous approximately 5.7 x 6.2 x 2.7 cm fluid collection within the left popliteal fossa compatible with a Baker cyst, grossly unchanged compared to the 04/2018 examination, previously, 5.9 cm  IMPRESSION: 1. No evidence of DVT within either lower extremity. 2. 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   Electronically Signed   By: Sandi Mariscal M.D.   On: 08/11/2018 11:12    Left heart cath Conclusions: 1. Significant 2-vessel coronary artery disease, including 90% ostial and 60-70% large OM2 stenoses.  Both lesions are heavily calcified. 2. Mild diffuse disease involving the RCA. 3. Mildly reduced left ventricular systolic function with anterior hypokinesis (LVEF 45-50%). 4. Moderately elevated left ventricular filling pressure (LVEDP 25-30 mmHg).  Recommendations: 9. Optimize volume status, as patient has moderately elevated LVEDP and cannot lie flat.  Will change furosemide to 40 mg IV BID.  Further titration may be needed based on urine output. 10. Once patient has been optimized, consider PCI to ostial/proximal LAD using atherectomy.  Given poor function status and oxygen-dependent COPD, she is not a good surgical candidate. 11. Medical therapy for 60-70% OM2 stenosis. 12. Aggressive secondary prevention.  Will load with ticagrelor.  Recommend uninterrupted dual antiplatelet therapy with Aspirin 81mg  daily and Ticagrelor 90mg  twice daily for a minimum of 12 months (ACS - Class I recommendation).      Antimicrobials:   Augmentin 8/15 >>     Subjective: Very wheezy, coughing.  Heartburn and neck pain  are improved.  Overnight after Lasix, she felt like she was tachycardic and could not breathe with ambulating to the bedside commode.  She had  no new fever, sputum.  No confusion, loss of consciousness.  Objective: Vitals:   08/16/18 2354 08/17/18 0414 08/17/18 0820 08/17/18 0824  BP: (!) 148/90 (!) 150/92    Pulse: 88 94    Resp: 20 20    Temp: (!) 97.5 F (36.4 C) 97.6 F (36.4 C)    TempSrc: Oral Oral    SpO2: 96% 99% 96% 94%  Weight:      Height:        Intake/Output Summary (Last 24 hours) at 08/17/2018 0953 Last data filed at 08/16/2018 2200 Gross per 24 hour  Intake 360 ml  Output 2900 ml  Net -2540 ml   Filed Weights   08/14/18 0300 08/15/18 0309 08/16/18 0417  Weight: 87.9 kg 89.4 kg 88.8 kg    Examination: General appearance: Adult female, lying in bed, interactive HEENT: Anicteric, conjunctival pink, lids and lashes normal.  No nasal deformity, discharge, or epistaxis.  Lips moist, dentition poor.  Oropharynx moist, no oral lesions, hearing normal. Cardiac: Regular rate and rhythm, no murmurs, JVP not visible due to body habitus, nonpitting lower extremity edema. Respiratory: Appears slightly dyspneic, out of breath at rest, wheezing throughout.  No rales. Abdomen: Abdomen soft without tenderness to palpation, ascites, or distention. MSK: No deformities or effusions. Neuro: Awake and alert, extraocular movements intact, face symmetric, moves all extremities with 5 out of 5 strength, normal coordination, speech fluent. Psych: Sensorium intact and responding to questions, attention normal, affect normal.  Judgment and insight appear slightly impaired due to baseline cognitive function.    Data Reviewed: I have personally reviewed following labs and imaging studies:  CBC: Recent Labs  Lab 08/10/18 1734 08/11/18 0458 08/13/18 0423 08/14/18 0409 08/15/18 0258 08/16/18 0431 08/17/18 0213  WBC 16.4* 17.2* 19.5* 17.9* 13.5* 16.6* 18.4*  NEUTROABS 12.9 15.7*  --    --   --   --   --   HGB 13.1 12.2 13.1 13.8 12.5 12.7 13.3  HCT 42.8 40.5 42.7 45.1 40.5 41.7 42.2  MCV 100.5* 100.2* 99.5 98.7 98.5 98.3 95.7  PLT 269 290 276 272 250 266 161   Basic Metabolic Panel: Recent Labs  Lab 08/10/18 1734  08/12/18 0438  08/13/18 0423 08/14/18 0409 08/15/18 0258 08/16/18 0431 08/17/18 0213  NA 142   < > 139   < > 139 142 141 139 139  K 3.4*   < > 5.2*   < > 4.5 4.2 4.4 4.3 3.6  CL 92*   < > 92*   < > 93* 96* 99 97* 99  CO2 42*   < > 39*   < > 36* 35* 34* 34* 31  GLUCOSE 179*   < > 180*   < > 159* 146* 183* 150* 133*  BUN 7*   < > 18   < > 22 20 26* 19 18  CREATININE 0.81   < > 0.77   < > 0.85 0.93 0.99 0.94 0.93  CALCIUM 9.1   < > 9.2   < > 9.3 8.4* 8.8* 8.9 9.1  MG 2.0  --  2.3  --   --  2.2  --   --   --   PHOS 2.9  --   --   --   --   --   --   --   --    < > = values in this interval not displayed.   GFR: Estimated Creatinine Clearance: 63.6 mL/min (by C-G formula based on SCr  of 0.93 mg/dL). Liver Function Tests: Recent Labs  Lab 08/11/18 0458  AST 35  ALT 35  ALKPHOS 55  BILITOT 0.5  PROT 6.4*  ALBUMIN 3.3*   No results for input(s): LIPASE, AMYLASE in the last 168 hours. No results for input(s): AMMONIA in the last 168 hours. Coagulation Profile: Recent Labs  Lab 08/12/18 1143  INR 0.96   Cardiac Enzymes: Recent Labs  Lab 08/12/18 0942 08/12/18 1555 08/12/18 2125  TROPONINI 0.50* 0.50* 0.42*   BNP (last 3 results) No results for input(s): PROBNP in the last 8760 hours. HbA1C: No results for input(s): HGBA1C in the last 72 hours. CBG: Recent Labs  Lab 08/16/18 0642 08/16/18 1106 08/16/18 1613 08/16/18 2327 08/17/18 0610  GLUCAP 149* 214* 96 127* 110*   Lipid Profile: No results for input(s): CHOL, HDL, LDLCALC, TRIG, CHOLHDL, LDLDIRECT in the last 72 hours. Thyroid Function Tests: No results for input(s): TSH, T4TOTAL, FREET4, T3FREE, THYROIDAB in the last 72 hours. Anemia Panel: No results for input(s):  VITAMINB12, FOLATE, FERRITIN, TIBC, IRON, RETICCTPCT in the last 72 hours. Urine analysis:    Component Value Date/Time   COLORURINE STRAW (A) 06/04/2018 1340   APPEARANCEUR CLEAR 06/04/2018 1340   LABSPEC 1.004 (L) 06/04/2018 1340   PHURINE 9.0 (H) 06/04/2018 1340   GLUCOSEU NEGATIVE 06/04/2018 1340   HGBUR NEGATIVE 06/04/2018 1340   BILIRUBINUR NEGATIVE 06/04/2018 1340   KETONESUR NEGATIVE 06/04/2018 1340   PROTEINUR NEGATIVE 06/04/2018 1340   UROBILINOGEN 0.2 04/15/2014 1949   NITRITE NEGATIVE 06/04/2018 1340   LEUKOCYTESUR NEGATIVE 06/04/2018 1340   Sepsis Labs: @LABRCNTIP (procalcitonin:4,lacticacidven:4)  )No results found for this or any previous visit (from the past 240 hour(s)).       Radiology Studies: No results found.      Scheduled Meds: . amoxicillin-clavulanate  1 tablet Oral Q12H  . aspirin EC  81 mg Oral q morning - 10a  . atorvastatin  40 mg Oral q1800  . calcium-vitamin D  1 tablet Oral BID  . furosemide  40 mg Intravenous BID  . insulin aspart  0-20 Units Subcutaneous TID WC  . insulin aspart  0-5 Units Subcutaneous QHS  . insulin aspart  6 Units Subcutaneous TID WC  . ipratropium-albuterol  3 mL Nebulization TID  . methylPREDNISolone (SOLU-MEDROL) injection  60 mg Intravenous Q12H  . metoprolol tartrate  25 mg Oral BID  . mometasone-formoterol  2 puff Inhalation BID  . nicotine  21 mg Transdermal Daily  . pantoprazole  40 mg Oral BID  . pneumococcal 23 valent vaccine  0.5 mL Intramuscular Tomorrow-1000  . polyethylene glycol  17 g Oral Daily  . sodium chloride flush  3 mL Intravenous Q12H  . ticagrelor  90 mg Oral BID   Continuous Infusions: . sodium chloride    . heparin 900 Units/hr (08/16/18 2019)     LOS: 5 days    Time spent: 35 minutes    Edwin Dada, MD [facility] 08/17/2018, 9:53 AM     Pager [phone] --- please page though AMION:  [email or web address] Password [email or web address] If 7PM-7AM, please contact  night-coverage

## 2018-08-17 NOTE — Progress Notes (Signed)
ANTICOAGULATION CONSULT NOTE  Pharmacy Consult for Heparin Indication: chest pain/ACS, s/p cath  Patient Measurements: Height: 5\' 1"  (154.9 cm) Weight: 195 lb 11.2 oz (88.8 kg)(Scale A) IBW/kg (Calculated) : 47.8 HEPARIN DW (KG): 68.8  Vital Signs: Temp: 97.5 F (36.4 C) (08/19 2354) Temp Source: Oral (08/19 2354) BP: 148/90 (08/19 2354) Pulse Rate: 88 (08/19 2354)  Labs: Recent Labs    08/15/18 0258 08/16/18 0431 08/17/18 0213  HGB 12.5 12.7 13.3  HCT 40.5 41.7 42.2  PLT 250 266 262  HEPARINUNFRC 0.30 0.43 0.30  CREATININE 0.99 0.94 0.93    Estimated Creatinine Clearance: 63.6 mL/min (by C-G formula based on SCr of 0.93 mg/dL).   Assessment: 62 yo female presenting with acute COPD exacerbation and chest pain. Troponin 0.01>0.5>0.42. Coronary angiography has been scheduled for 08/16/2018. Pharmacy consulted to manage heparin infusion for ACS.   Pt now s/p LHC, pharmacy consulted to resume IV heparin 2 hr after TR band removal (~1800) while awaiting consideration for PCI to prox LAD.  8/20 AM update: heparin level therapeutic x 1 after re-start s/p cath  Goal of Therapy:  Heparin level 0.3-0.7 units/ml Monitor platelets by anticoagulation protocol: Yes   Plan:  Cont heparin at 900 units/hr [place], PharmD, [place] Pharmacist Phone: [phone]

## 2018-08-17 NOTE — Progress Notes (Signed)
Progress Note  Patient Name: Cindy [name] Date of Encounter: 08/17/2018  Primary Cardiologist: No primary care provider on file. ([place])  Subjective   Sitting up eating today, reports that her breathing is much better. Feels that she can lie flat, but pain/anxiety are what limit her more than breathing. Amenable to attempting PCI once she is feeling up to it. No current chest pain.  Inpatient Medications    Scheduled Meds: . amoxicillin-clavulanate  1 tablet Oral Q12H  . aspirin EC  81 mg Oral q morning - 10a  . atorvastatin  40 mg Oral q1800  . calcium-vitamin D  1 tablet Oral BID  . furosemide  40 mg Intravenous BID  . insulin aspart  0-20 Units Subcutaneous TID WC  . insulin aspart  0-5 Units Subcutaneous QHS  . insulin aspart  6 Units Subcutaneous TID WC  . ipratropium-albuterol  3 mL Nebulization TID  . methylPREDNISolone (SOLU-MEDROL) injection  60 mg Intravenous Q12H  . metoprolol tartrate  25 mg Oral BID  . mometasone-formoterol  2 puff Inhalation BID  . nicotine  21 mg Transdermal Daily  . pantoprazole  40 mg Oral BID  . pneumococcal 23 valent vaccine  0.5 mL Intramuscular Tomorrow-1000  . polyethylene glycol  17 g Oral Daily  . sodium chloride flush  3 mL Intravenous Q12H  . ticagrelor  90 mg Oral BID   Continuous Infusions: . sodium chloride    . heparin 900 Units/hr (08/17/18 1857)   PRN Meds: sodium chloride, acetaminophen (TYLENOL) oral liquid 160 mg/5 mL, albuterol, ALPRAZolam, alum & mag hydroxide-simeth, diphenhydrAMINE, famotidine, guaiFENesin-dextromethorphan, hydrALAZINE, labetalol, ondansetron **OR** ondansetron (ZOFRAN) IV, oxyCODONE-acetaminophen, sodium chloride, sodium chloride flush   Vital Signs    Vitals:   08/17/18 0820 08/17/18 0824 08/17/18 1156 08/17/18 1355  BP:   120/78   Pulse:   (!) 109   Resp:   18   Temp:   98.1 F (36.7 C)   TempSrc:   Oral   SpO2: 96% 94% 95% 96%  Weight:      Height:        Intake/Output  Summary (Last 24 hours) at 08/17/2018 1903 Last data filed at 08/17/2018 1857 Gross per 24 hour  Intake 322.49 ml  Output 4800 ml  Net -4477.51 ml   Filed Weights   08/14/18 0300 08/15/18 0309 08/16/18 0417  Weight: 87.9 kg 89.4 kg 88.8 kg    Telemetry    Sinus rhythm- Personally Reviewed  ECG    No new tracings- Personally Reviewed  Physical Exam   GEN: No acute distress.   Neck: No appreciable JVD but difficult due to body habitus Cardiac: RRR, no murmurs, rubs, or gallops.  Respiratory:  Coarse but improved from yesterday GI: Soft, nontender, non-distended  MS:  1+ bilateral pedal edema; No deformity. Neuro:  Nonfocal  Psych: Normal affect   Labs    Chemistry Recent Labs  Lab 08/11/18 0458  08/15/18 0258 08/16/18 0431 08/17/18 0213  NA 141   < > 141 139 139  K 4.3   < > 4.4 4.3 3.6  CL 90*   < > 99 97* 99  CO2 38*   < > 34* 34* 31  GLUCOSE 220*   < > 183* 150* 133*  BUN 10   < > 26* 19 18  CREATININE 0.78   < > 0.99 0.94 0.93  CALCIUM 9.3   < > 8.8* 8.9 9.1  PROT 6.4*  --   --   --   --  ALBUMIN 3.3*  --   --   --   --   AST 35  --   --   --   --   ALT 35  --   --   --   --   ALKPHOS 55  --   --   --   --   BILITOT 0.5  --   --   --   --   GFRNONAA >60   < > 60* >60 >60  GFRAA >60   < > >60 >60 >60  ANIONGAP 13   < > 8 8 9    < > = values in this interval not displayed.     Hematology Recent Labs  Lab 08/15/18 0258 08/16/18 0431 08/17/18 0213  WBC 13.5* 16.6* 18.4*  RBC 4.11 4.24 4.41  HGB 12.5 12.7 13.3  HCT 40.5 41.7 42.2  MCV 98.5 98.3 95.7  MCH 30.4 30.0 30.2  MCHC 30.9 30.5 31.5  RDW 14.6 14.4 14.3  PLT 250 266 262    Cardiac Enzymes Recent Labs  Lab 08/12/18 0942 08/12/18 1555 08/12/18 2125  TROPONINI 0.50* 0.50* 0.42*    No results for input(s): TROPIPOC in the last 168 hours.   BNP No results for input(s): BNP, PROBNP in the last 168 hours.   DDimer No results for input(s): DDIMER in the last 168 hours.   Radiology      No results found.  Cardiac Studies   Echocardiogram 08/11/2018:  Left ventricle: The cavity size was normal. Wall thickness was increased in a pattern of mild LVH. The estimated ejection fraction was 55%. There is hypokinesis of the mid-apicalanteroseptal myocardium. Indeterminate diastolic function. - Aortic valve: Mildly to moderately calcified annulus. Trileaflet; mildly calcified leaflets. Mean gradient (S): 10 mm Hg. Peak gradient (S): 20 mm Hg. Moderately sclerotic to mildly stenotic aortic valve. VTI ratio of LVOT to aortic valve: 0.58. - Mitral valve: There was trivial regurgitation. - Right atrium: Central venous pressure (est): 3 mm Hg. - Atrial septum: No defect or patent foramen ovale was identified. - Tricuspid valve: There was trivial regurgitation. - Pulmonary arteries: Systolic pressure could not be accurately estimated. - Pericardium, extracardiac: A prominent pericardial fat pad was present  Cath 08/16/18 Conclusions: 1. Significant 2-vessel coronary artery disease, including 90% ostial and 60-70% large OM2 stenoses.  Both lesions are heavily calcified. 2. Mild diffuse disease involving the RCA. 3. Mildly reduced left ventricular systolic function with anterior hypokinesis (LVEF 45-50%). 4. Moderately elevated left ventricular filling pressure (LVEDP 25-30 mmHg).  Recommendations: 1. Optimize volume status, as patient has moderately elevated LVEDP and cannot lie flat.  Will change furosemide to 40 mg IV BID.  Further titration may be needed based on urine output. 2. Once patient has been optimized, consider PCI to ostial/proximal LAD using atherectomy.  Given poor function status and oxygen-dependent COPD, she is not a good surgical candidate. 3. Medical therapy for 60-70% OM2 stenosis. 4. Aggressive secondary prevention.  Will load with ticagrelor.  Recommend uninterrupted dual antiplatelet therapy with Aspirin 81mg  daily and Ticagrelor  90mg  twice daily for a minimum of 12 months (ACS - Class I recommendation).   Patient Profile     62 y.o. female with a hx ofCOPDand currently being treated for a COPD exacerbation, hyperlipidemia, hypertension,T11 compression fracture and chronic pain,and tobacco abuse who is being seen today for the evaluation ofchest painat the request of Dr. Wynetta Emery.  Assessment & Plan    1. Non-STEMI:   -continue aspirin and  ticagrelor -continue atorvasatin -continue metoprolol -consider ARB if renal function stable post cath (likely will be difficult to distinguish if cough is due to ACEI in the future) -brisk diuresis. She is out nearly 5 L today. Will need to watch her renal function closely. She does feel better, but likely daily lasix will be enough. Will reassess after AM dose tomorrow. -possible cath with PCI on 8/22 if she is able to tolerate.  2. Acute COPD exacerbation: She is on IV Solu-Medrol and Augmentin. She is also on Dulera and albuterol nebs. Elevated WBC likely due to COPD/steroids  3. Hypertension: well controlled today  4. Hyperlipidemia:Continue atorvastatin  5. Bilateral leg edema: improving  6. Tobacco abuse: encourage cessation  For questions or updates, please contact [place] Please consult [email or web address] for contact info under Cardiology/STEMI.      Signed, Buford Dresser, MD  08/17/2018, 7:03 PM

## 2018-08-17 NOTE — Progress Notes (Signed)
ANTICOAGULATION CONSULT NOTE  Pharmacy Consult for Heparin Indication: chest pain/ACS, s/p cath  Patient Measurements: Height: 5\' 1"  (154.9 cm) Weight: 195 lb 11.2 oz (88.8 kg)(Scale A) IBW/kg (Calculated) : 47.8 HEPARIN DW (KG): 68.8  Vital Signs: Temp: 97.6 F (36.4 C) (08/20 0414) Temp Source: Oral (08/20 0414) BP: 150/92 (08/20 0414) Pulse Rate: 94 (08/20 0414)  Labs: Recent Labs    08/15/18 0258 08/16/18 0431 08/17/18 0213 08/17/18 1119  HGB 12.5 12.7 13.3  --   HCT 40.5 41.7 42.2  --   PLT 250 266 262  --   HEPARINUNFRC 0.30 0.43 0.30 0.47  CREATININE 0.99 0.94 0.93  --     Estimated Creatinine Clearance: 63.6 mL/min (by C-G formula based on SCr of 0.93 mg/dL).   Assessment: 62 yo female presenting with acute COPD exacerbation and chest pain. Troponin 0.01>0.5>0.42. Coronary angiography has been scheduled for 08/16/2018. Pharmacy consulted to manage heparin infusion for ACS.   Pt now s/p LHC, pharmacy consulted to resume IV heparin 2 hr after TR band removal (~1800) while awaiting consideration for PCI to prox LAD.  HL this afternoon remains therapeutic on hepatin 900 units/hr. H/H and Plt wnl.   Goal of Therapy:  Heparin level 0.3-0.7 units/ml Monitor platelets by anticoagulation protocol: Yes   Plan:  Cont heparin at 900 units/hr Monitor daily HL, CBC and s/s of bleeding   Albertina Parr, PharmD., BCPS Clinical Pharmacist Clinical phone for 08/17/18 until 3:30pm: [phone] If after 3:30pm, please refer to [facility] for unit-specific pharmacist

## 2018-08-18 ENCOUNTER — Inpatient Hospital Stay (HOSPITAL_COMMUNITY): Payer: Medicare Other

## 2018-08-18 DIAGNOSIS — R9431 Abnormal electrocardiogram [ECG] [EKG]: Secondary | ICD-10-CM

## 2018-08-18 LAB — CBC
HCT: 44.3 % (ref 36.0–46.0)
HEMOGLOBIN: 14 g/dL (ref 12.0–15.0)
MCH: 29.9 pg (ref 26.0–34.0)
MCHC: 31.6 g/dL (ref 30.0–36.0)
MCV: 94.5 fL (ref 78.0–100.0)
Platelets: 255 10*3/uL (ref 150–400)
RBC: 4.69 MIL/uL (ref 3.87–5.11)
RDW: 14.1 % (ref 11.5–15.5)
WBC: 22.3 10*3/uL — ABNORMAL HIGH (ref 4.0–10.5)

## 2018-08-18 LAB — BASIC METABOLIC PANEL
Anion gap: 14 (ref 5–15)
BUN: 20 mg/dL (ref 8–23)
CHLORIDE: 93 mmol/L — AB (ref 98–111)
CO2: 29 mmol/L (ref 22–32)
Calcium: 9.5 mg/dL (ref 8.9–10.3)
Creatinine, Ser: 0.98 mg/dL (ref 0.44–1.00)
GFR calc non Af Amer: >60 mL/min (ref >60)
Glucose, Bld: 202 mg/dL — ABNORMAL HIGH (ref 70–99)
POTASSIUM: 3.5 mmol/L (ref 3.5–5.1)
Sodium: 136 mmol/L (ref 135–145)

## 2018-08-18 LAB — GLUCOSE, CAPILLARY
Glucose-Capillary: 135 mg/dL — ABNORMAL HIGH (ref 70–99)
Glucose-Capillary: 210 mg/dL — ABNORMAL HIGH (ref 70–99)
Glucose-Capillary: 131 mg/dL — ABNORMAL HIGH (ref 70–99)
Glucose-Capillary: 174 mg/dL — ABNORMAL HIGH (ref 70–99)
Glucose-Capillary: 126 mg/dL — ABNORMAL HIGH (ref 70–99)

## 2018-08-18 LAB — HEPARIN LEVEL (UNFRACTIONATED): Heparin Unfractionated: 0.53 IU/mL (ref 0.30–0.70)

## 2018-08-18 MED ORDER — SODIUM CHLORIDE 0.9% FLUSH: 3.0000 mL | INTRAVENOUS | Status: DC | PRN

## 2018-08-18 MED ORDER — ALPRAZOLAM 0.5 MG PO TABS
0.5000 mg | ORAL_TABLET | Freq: Three times a day (TID) | ORAL | Status: DC | PRN
  Administered 2018-08-18 – 2018-08-21 (×7): 0.5 mg via ORAL
  Filled 2018-08-18 (×8): qty 1

## 2018-08-18 MED ORDER — ASPIRIN 81 MG PO CHEW
81.0000 mg | CHEWABLE_TABLET | ORAL | Status: AC
  Administered 2018-08-19: 81 mg via ORAL
  Filled 2018-08-18: qty 1

## 2018-08-18 MED ORDER — SODIUM CHLORIDE 0.9 % IV SOLN
INTRAVENOUS | Status: DC
  Administered 2018-08-19: 04:00:00 via INTRAVENOUS

## 2018-08-18 MED ORDER — SODIUM CHLORIDE 0.9 % IV SOLN: 250.0000 mL | INTRAVENOUS | Status: DC | PRN

## 2018-08-18 MED ORDER — METOPROLOL TARTRATE 12.5 MG HALF TABLET
12.5000 mg | ORAL_TABLET | Freq: Two times a day (BID) | ORAL | Status: DC
  Administered 2018-08-18 – 2018-08-21 (×6): 12.5 mg via ORAL
  Filled 2018-08-18 (×6): qty 1

## 2018-08-18 MED ORDER — ASPIRIN EC 81 MG PO TBEC
81.0000 mg | DELAYED_RELEASE_TABLET | Freq: Every morning | ORAL | Status: DC
  Administered 2018-08-20 – 2018-08-21 (×2): 81 mg via ORAL
  Filled 2018-08-18 (×2): qty 1

## 2018-08-18 MED ORDER — SODIUM CHLORIDE 0.9% FLUSH: 3.0000 mL | Freq: Two times a day (BID) | INTRAVENOUS | Status: DC

## 2018-08-18 NOTE — Consult Note (Signed)
   [facility] CM Inpatient Consult   08/18/2018  Cindy [name] Feb 27, 1956 [id number]   We have reviewed your referral request and acknowledging for follow up.    Natividad Brood, RN BSN [facility] Liaison  [phone] business mobile phone Toll free office [phone]

## 2018-08-18 NOTE — Progress Notes (Addendum)
Nutrition Follow Up  DOCUMENTATION CODES:   Obesity unspecified  INTERVENTION:    Continue Heart Healthy diet  NEW NUTRITION DIAGNOSIS:   Increased nutrient needs related to chronic illness (COPD, CHF) as evidenced by estimated nutrition needs, ongoing  GOAL:   Patient will meet greater than or equal to 90% of their needs, met  MONITOR:   PO intake, Weight trends, Labs   ASSESSMENT:    Patient is a 62 yo female with hx of COPD (home O2 @ 3 Liters), Adrenal adenoma. Tobacco smoker.   Presented to ED with shortness of breath- acute COPD exacerbation, HLD, Hyperglycemia.    Pt admitted with SOB; COPD exacerbation.  Found to have NSTEMI. Transferred from [facility] for L heart cath. PO intake good at 90% per flowsheet records. Labs and medications reviewed. CBG's [id number].  Diet Order:   Diet Order            Diet NPO time specified  Diet effective midnight        Diet Heart Room service appropriate? Yes; Fluid consistency: Thin  Diet effective now             EDUCATION NEEDS:   Education needs have been addressed   Skin:  Skin Assessment: Reviewed RN Assessment  Last BM:  8/20  Height:   Ht Readings from Last 1 Encounters:  08/10/18 5' 1"  (1.549 m)   Weight:   Wt Readings from Last 1 Encounters:  08/16/18 88.8 kg   Ideal Body Weight:  48 kg  BMI:  Body mass index is 36.98 kg/m.  Estimated Nutritional Needs:   Kcal:  1700-1900  Protein:  90-105 gm  Fluid:  1.7-1.9 L  Arthur Holms, RD, LDN Pager #: [phone] After-Hours Pager #: [phone]

## 2018-08-18 NOTE — Care Management Note (Signed)
Case Management Note  Patient Details  Name: Cindy [name] MRN: [id number] Date of Birth: 02/20/1956  Subjective/Objective:           COPD         Action/Plan:  Independent patient from home admitted w COPD, heart cath NSTEMI. Patient has home oxygen through [employer] [facility] [employer], declines 3/1. Patient lives with landlord "best friend" whom she states will be able to provide supportive care.  Order placed for [facility] case Management consult. Patient provided with [place] card, has medicaid. CM will continue to follow.   Expected Discharge Date:  08/14/18               Expected Discharge Plan:  Home/Self Care  In-House Referral:     Discharge planning Services  CM Consult  Post Acute Care Choice:  NA Choice offered to:  NA  DME Arranged:    DME Agency:     HH Arranged:    HH Agency:     Status of Service:  Completed, signed off  If discussed at [employer] of Stay Meetings, dates discussed:    Additional Comments:  Carles Collet, RN 08/18/2018, 2:15 PM

## 2018-08-18 NOTE — Progress Notes (Signed)
PROGRESS NOTE    Cindy [name]  OHY:[id number] DOB: 1956/11/16 DOA: 08/10/2018 PCP: Alycia Rossetti, MD      Brief Narrative:  Cindy [name] is a 62 y.o. 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  While in the hospital, noted to have exertional chest discomfort, troponins cycled and found to have NSTEMI and transferred to [facility] for LHC.   Assessment & Plan:  NSTEMI -Cardiology following, continue aspirin, ticagrelor, statin -Unfortunately may not be able to tolerate metoprolol due to severe COPD, monitor for another day on this, cut down dose -Cardiology following, status post diagnostic cath, plan for possible cath with PCI in one or 2 days  Acute on chronic systolic CHF Found to have E 40% and orthopnea and elevated EDP during LHC on 8/19, started Lasix. -she is -5 L so far -Continue IV Lasix and potassium -Monitor electrolytes, I/O and weights  COPD exacerbation -continues to have moderate amount of wheezing, which thinks she worsened in the hospital I suspect addition of metoprolol may be playing a role, will cut down to 12.5 mg twice a day -Continue IV Solu-Medrol, day 6 of Augmentin now -Continue home ICS/LABA -worsening leukocytosis suspect likely secondary to IV steroids and stress demargination, will repeat chest x-ray  Diabetes mellitus type II -stable, continue sliding scale insulin  Smoking Cessation recommended, modalities discussed  GERD -Continue PPI -H2RA PRN  DVT prophylaxis: Not applicable, on heparin Code Status: Full code Family Communication: None present MDM and disposition Plan: home pending cardiac workup and treatment    Consultants:   Cardiology  Procedures:   Echo 8/14 LV EF: 55%  ------------------------------------------------------------------- History:   PMH:  Acquired from the patient and from  the patient&'s chart.  Chronic obstructive pulmonary disease.  PMH:  GERD. Tobacco abuse.  Risk factors:  Dyslipidemia.  ------------------------------------------------------------------- Study Conclusions  - Left ventricle: The cavity size was normal. Wall thickness was   increased in a pattern of mild LVH. The estimated ejection   fraction was 55%. There is hypokinesis of the   mid-apicalanteroseptal myocardium. Indeterminate diastolic   function. - Aortic valve: Mildly to moderately calcified annulus. Trileaflet;   mildly calcified leaflets. Mean gradient (S): 10 mm Hg. Peak   gradient (S): 20 mm Hg. Moderately sclerotic to mildly stenotic   aortic valve. VTI ratio of LVOT to aortic valve: 0.58. - Mitral valve: There was trivial regurgitation. - Right atrium: Central venous pressure (est): 3 mm Hg. - Atrial septum: No defect or patent foramen ovale was identified. - Tricuspid valve: There was trivial regurgitation. - Pulmonary arteries: Systolic pressure could not be accurately   estimated. - Pericardium, extracardiac: A prominent pericardial fat pad was   present.   DVT LE8/14 FINDINGS: RIGHT LOWER EXTREMITY  Common Femoral Vein: No evidence of thrombus. Normal compressibility, respiratory phasicity and response to augmentation.  Saphenofemoral Junction: No evidence of thrombus. Normal compressibility and flow on color Doppler imaging.  Profunda Femoral Vein: No evidence of thrombus. Normal compressibility and flow on color Doppler imaging.  Femoral Vein: No evidence of thrombus. Normal compressibility, respiratory phasicity and response to augmentation.  Popliteal Vein: No evidence of thrombus. Normal compressibility, respiratory phasicity and response to augmentation.  Calf Veins: No evidence of thrombus. Normal compressibility and flow on color Doppler imaging.  Superficial Great Saphenous Vein: No evidence of thrombus.  Normal compressibility.  Venous Reflux:  None.  Other Findings:  None.  LEFT LOWER EXTREMITY  Common Femoral Vein: No evidence of thrombus. Normal compressibility, respiratory phasicity and response to augmentation.  Saphenofemoral Junction: No evidence of thrombus. Normal compressibility and flow on color Doppler imaging.  Profunda Femoral Vein: No evidence of thrombus. Normal compressibility and flow on color Doppler imaging.  Femoral Vein: No evidence of thrombus. Normal compressibility, respiratory phasicity and response to augmentation.  Popliteal Vein: No evidence of thrombus. Normal compressibility, respiratory phasicity and response to augmentation.  Calf Veins: No evidence of thrombus. Normal compressibility and flow on color Doppler imaging.  Superficial Great Saphenous Vein: No evidence of thrombus. Normal compressibility.  Venous Reflux:  None.  Other Findings: There is a serpiginous approximately 5.7 x 6.2 x 2.7 cm fluid collection within the left popliteal fossa compatible with a Baker cyst, grossly unchanged compared to the 04/2018 examination, previously, 5.9 cm  IMPRESSION: 1. No evidence of DVT within either lower extremity. 2. 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   Electronically Signed   By: Sandi Mariscal M.D.   On: 08/11/2018 11:12    Left heart cath Conclusions: 1. Significant 2-vessel coronary artery disease, including 90% ostial and 60-70% large OM2 stenoses.  Both lesions are heavily calcified. 2. Mild diffuse disease involving the RCA. 3. Mildly reduced left ventricular systolic function with anterior hypokinesis (LVEF 45-50%). 4. Moderately elevated left ventricular filling pressure (LVEDP 25-30 mmHg).  Recommendations: 1. Optimize volume status, as patient has moderately elevated LVEDP and cannot lie flat.  Will change furosemide to 40 mg IV BID.  Further titration may be  needed based on urine output. 2. Once patient has been optimized, consider PCI to ostial/proximal LAD using atherectomy.  Given poor function status and oxygen-dependent COPD, she is not a good surgical candidate. 3. Medical therapy for 60-70% OM2 stenosis. 4. Aggressive secondary prevention.  Will load with ticagrelor.  Recommend uninterrupted dual antiplatelet therapy with Aspirin 81mg  daily and Ticagrelor 90mg  twice daily for a minimum of 12 months (ACS - Class I recommendation).      Antimicrobials:   Augmentin 8/15 >>     Subjective: -breathing improving, continues to be short of breath and wheezing  Objective: Vitals:   08/17/18 2235 08/18/18 0352 08/18/18 0736 08/18/18 0823  BP: (!) 146/89 136/89  140/79  Pulse: 88 96  99  Resp: 18 14  17   Temp: 98.5 F (36.9 C) 98.1 F (36.7 C)  97.8 F (36.6 C)  TempSrc: Oral Oral  Oral  SpO2: 100% 96% 98% 95%  Weight:      Height:        Intake/Output Summary (Last 24 hours) at 08/18/2018 1231 Last data filed at 08/18/2018 0300 Gross per 24 hour  Intake 322.49 ml  Output 1500 ml  Net -1177.51 ml   Filed Weights   08/14/18 0300 08/15/18 0309 08/16/18 0417  Weight: 87.9 kg 89.4 kg 88.8 kg    Examination: Gen: Awake, Alert, Oriented X 3, chronically ill-appearing female, appears much older than stated age 81: PERRLA, Neck supple, no JVD Lungs: bilateral expiratory wheezes CVS: RRR,No Gallops,Rubs or new Murmurs Abd: soft, Non tender, non distended, BS present Extremities: trace edema Skin: no new rashes Psych: anxious appearing    Data Reviewed: I have personally reviewed following labs and imaging studies:  CBC: Recent Labs  Lab 08/14/18 0409 08/15/18 0258 08/16/18 0431 08/17/18 0213 08/18/18 0314  WBC 17.9* 13.5* 16.6* 18.4* 22.3*  HGB 13.8 12.5 12.7 13.3  14.0  HCT 45.1 40.5 41.7 42.2 44.3  MCV 98.7 98.5 98.3 95.7 94.5  PLT 272 250 266 262 366   Basic Metabolic Panel: Recent Labs  Lab  08/12/18 0438  08/14/18 0409 08/15/18 0258 08/16/18 0431 08/17/18 0213 08/18/18 0314  NA 139   < > 142 141 139 139 136  K 5.2*   < > 4.2 4.4 4.3 3.6 3.5  CL 92*   < > 96* 99 97* 99 93*  CO2 39*   < > 35* 34* 34* 31 29  GLUCOSE 180*   < > 146* 183* 150* 133* 202*  BUN 18   < > 20 26* 19 18 20   CREATININE 0.77   < > 0.93 0.99 0.94 0.93 0.98  CALCIUM 9.2   < > 8.4* 8.8* 8.9 9.1 9.5  MG 2.3  --  2.2  --   --   --   --    < > = values in this interval not displayed.   GFR: Estimated Creatinine Clearance: 60.3 mL/min (by C-G formula based on SCr of 0.98 mg/dL). Liver Function Tests: No results for input(s): AST, ALT, ALKPHOS, BILITOT, PROT, ALBUMIN in the last 168 hours. No results for input(s): LIPASE, AMYLASE in the last 168 hours. No results for input(s): AMMONIA in the last 168 hours. Coagulation Profile: Recent Labs  Lab 08/12/18 1143  INR 0.96   Cardiac Enzymes: Recent Labs  Lab 08/12/18 0942 08/12/18 1555 08/12/18 2125  TROPONINI 0.50* 0.50* 0.42*   BNP (last 3 results) No results for input(s): PROBNP in the last 8760 hours. HbA1C: No results for input(s): HGBA1C in the last 72 hours. CBG: Recent Labs  Lab 08/17/18 1154 08/17/18 1739 08/17/18 2106 08/18/18 0617 08/18/18 1054  GLUCAP 159* 107* 135* 210* 131*   Lipid Profile: No results for input(s): CHOL, HDL, LDLCALC, TRIG, CHOLHDL, LDLDIRECT in the last 72 hours. Thyroid Function Tests: No results for input(s): TSH, T4TOTAL, FREET4, T3FREE, THYROIDAB in the last 72 hours. Anemia Panel: No results for input(s): VITAMINB12, FOLATE, FERRITIN, TIBC, IRON, RETICCTPCT in the last 72 hours. Urine analysis:    Component Value Date/Time   COLORURINE STRAW (A) 06/04/2018 1340   APPEARANCEUR CLEAR 06/04/2018 1340   LABSPEC 1.004 (L) 06/04/2018 1340   PHURINE 9.0 (H) 06/04/2018 1340   GLUCOSEU NEGATIVE 06/04/2018 1340   HGBUR NEGATIVE 06/04/2018 1340   BILIRUBINUR NEGATIVE 06/04/2018 1340   KETONESUR NEGATIVE  06/04/2018 1340   PROTEINUR NEGATIVE 06/04/2018 1340   UROBILINOGEN 0.2 04/15/2014 1949   NITRITE NEGATIVE 06/04/2018 1340   LEUKOCYTESUR NEGATIVE 06/04/2018 1340   Sepsis Labs: @LABRCNTIP (procalcitonin:4,lacticacidven:4)  )No results found for this or any previous visit (from the past 240 hour(s)).       Radiology Studies: Dg Chest 2 View  Result Date: 08/18/2018 CLINICAL DATA:  Weakness and shortness of breath today. EXAM: CHEST - 2 VIEW COMPARISON:  Single-view of the chest 08/13/2018. CT chest, abdomen and pelvis 06/04/2018. FINDINGS: The lungs are emphysematous but clear. Heart size is normal. Aortic atherosclerosis is noted. No pneumothorax or pleural effusion. T11 compression fracture is identified as seen on the prior CT. Vertebral body height loss is up to 90% and worse than on the prior CT. No new fracture is identified. IMPRESSION: No acute disease. Emphysema. T11 compression fracture seen on 06/04/2018 CT scan demonstrates increased vertebral body height loss since that study. Electronically Signed   By: Inge Rise M.D.   On: 08/18/2018 11:28  Scheduled Meds: . amoxicillin-clavulanate  1 tablet Oral Q12H  . aspirin EC  81 mg Oral q morning - 10a  . atorvastatin  40 mg Oral q1800  . calcium-vitamin D  1 tablet Oral BID  . furosemide  40 mg Intravenous BID  . insulin aspart  0-20 Units Subcutaneous TID WC  . insulin aspart  0-5 Units Subcutaneous QHS  . insulin aspart  6 Units Subcutaneous TID WC  . ipratropium-albuterol  3 mL Nebulization TID  . methylPREDNISolone (SOLU-MEDROL) injection  60 mg Intravenous Q12H  . metoprolol tartrate  25 mg Oral BID  . mometasone-formoterol  2 puff Inhalation BID  . nicotine  21 mg Transdermal Daily  . pantoprazole  40 mg Oral BID  . pneumococcal 23 valent vaccine  0.5 mL Intramuscular Tomorrow-1000  . polyethylene glycol  17 g Oral Daily  . sodium chloride flush  3 mL Intravenous Q12H  . ticagrelor  90 mg Oral BID    Continuous Infusions: . sodium chloride    . heparin 900 Units/hr (08/17/18 1857)     LOS: 6 days    Time spent: 35 minutes    Domenic Polite, MD [facility] 08/18/2018, 12:31 PM      please page though AMION:  [email or web address] Password [email or web address] If 7PM-7AM, please contact night-coverage

## 2018-08-18 NOTE — H&P (View-Only) (Signed)
Progress Note  Patient Name: Cindy [name] Date of Encounter: 08/18/2018  Primary Cardiologist: No primary care provider on file. ([place])  Subjective   Sitting up eating today, reports that her breathing is much better. Feels that she can lie flat, but pain/anxiety are what limit her more than breathing. Amenable to attempting PCI once she is feeling up to it. No current chest pain.  Inpatient Medications    Scheduled Meds: . amoxicillin-clavulanate  1 tablet Oral Q12H  . aspirin EC  81 mg Oral q morning - 10a  . atorvastatin  40 mg Oral q1800  . calcium-vitamin D  1 tablet Oral BID  . furosemide  40 mg Intravenous BID  . insulin aspart  0-20 Units Subcutaneous TID WC  . insulin aspart  0-5 Units Subcutaneous QHS  . insulin aspart  6 Units Subcutaneous TID WC  . ipratropium-albuterol  3 mL Nebulization TID  . methylPREDNISolone (SOLU-MEDROL) injection  60 mg Intravenous Q12H  . metoprolol tartrate  12.5 mg Oral BID  . mometasone-formoterol  2 puff Inhalation BID  . nicotine  21 mg Transdermal Daily  . pantoprazole  40 mg Oral BID  . pneumococcal 23 valent vaccine  0.5 mL Intramuscular Tomorrow-1000  . polyethylene glycol  17 g Oral Daily  . sodium chloride flush  3 mL Intravenous Q12H  . ticagrelor  90 mg Oral BID   Continuous Infusions: . sodium chloride    . heparin 900 Units/hr (08/18/18 0800)   PRN Meds: sodium chloride, acetaminophen (TYLENOL) oral liquid 160 mg/5 mL, albuterol, ALPRAZolam, alum & mag hydroxide-simeth, diphenhydrAMINE, famotidine, guaiFENesin-dextromethorphan, hydrALAZINE, labetalol, ondansetron **OR** ondansetron (ZOFRAN) IV, oxyCODONE-acetaminophen, sodium chloride, sodium chloride flush   Vital Signs    Vitals:   08/18/18 0352 08/18/18 0736 08/18/18 0823 08/18/18 1327  BP: 136/89  140/79 131/71  Pulse: 96  99 97  Resp: 14  17 12   Temp: 98.1 F (36.7 C)  97.8 F (36.6 C) 98 F (36.7 C)  TempSrc: Oral  Oral Oral  SpO2: 96% 98%  95% 99%  Weight:      Height:        Intake/Output Summary (Last 24 hours) at 08/18/2018 1410 Last data filed at 08/18/2018 1302 Gross per 24 hour  Intake 439.77 ml  Output 1550 ml  Net -1110.23 ml   Filed Weights   08/14/18 0300 08/15/18 0309 08/16/18 0417  Weight: 87.9 kg 89.4 kg 88.8 kg    Telemetry    Sinus rhythm- Personally Reviewed  ECG    No new tracings- Personally Reviewed  Physical Exam   GEN: No acute distress.   Neck: No appreciable JVD but difficult due to body habitus Cardiac: RRR, no murmurs, rubs, or gallops.  Respiratory:  Distant breath sounds but no wheezing or crackles, much improved from yesterday GI: Soft, nontender, non-distended  MS:  trace bilateral pedal edema; No deformity. Neuro:  Nonfocal  Psych: Normal affect   Labs    Chemistry Recent Labs  Lab 08/16/18 0431 08/17/18 0213 08/18/18 0314  NA 139 139 136  K 4.3 3.6 3.5  CL 97* 99 93*  CO2 34* 31 29  GLUCOSE 150* 133* 202*  BUN 19 18 20   CREATININE 0.94 0.93 0.98  CALCIUM 8.9 9.1 9.5  GFRNONAA >60 >60 >60  GFRAA >60 >60 >60  ANIONGAP 8 9 14      Hematology Recent Labs  Lab 08/16/18 0431 08/17/18 0213 08/18/18 0314  WBC 16.6* 18.4* 22.3*  RBC 4.24 4.41 4.69  HGB 12.7 13.3 14.0  HCT 41.7 42.2 44.3  MCV 98.3 95.7 94.5  MCH 30.0 30.2 29.9  MCHC 30.5 31.5 31.6  RDW 14.4 14.3 14.1  PLT 266 262 255    Cardiac Enzymes Recent Labs  Lab 08/12/18 0942 08/12/18 1555 08/12/18 2125  TROPONINI 0.50* 0.50* 0.42*    No results for input(s): TROPIPOC in the last 168 hours.   BNP No results for input(s): BNP, PROBNP in the last 168 hours.   DDimer No results for input(s): DDIMER in the last 168 hours.   Radiology    Dg Chest 2 View  Result Date: 08/18/2018 CLINICAL DATA:  Weakness and shortness of breath today. EXAM: CHEST - 2 VIEW COMPARISON:  Single-view of the chest 08/13/2018. CT chest, abdomen and pelvis 06/04/2018. FINDINGS: The lungs are emphysematous but clear.  Heart size is normal. Aortic atherosclerosis is noted. No pneumothorax or pleural effusion. T11 compression fracture is identified as seen on the prior CT. Vertebral body height loss is up to 90% and worse than on the prior CT. No new fracture is identified. IMPRESSION: No acute disease. Emphysema. T11 compression fracture seen on 06/04/2018 CT scan demonstrates increased vertebral body height loss since that study. Electronically Signed   By: Inge Rise M.D.   On: 08/18/2018 11:28    Cardiac Studies   Echocardiogram 08/11/2018:  Left ventricle: The cavity size was normal. Wall thickness was increased in a pattern of mild LVH. The estimated ejection fraction was 55%. There is hypokinesis of the mid-apicalanteroseptal myocardium. Indeterminate diastolic function. - Aortic valve: Mildly to moderately calcified annulus. Trileaflet; mildly calcified leaflets. Mean gradient (S): 10 mm Hg. Peak gradient (S): 20 mm Hg. Moderately sclerotic to mildly stenotic aortic valve. VTI ratio of LVOT to aortic valve: 0.58. - Mitral valve: There was trivial regurgitation. - Right atrium: Central venous pressure (est): 3 mm Hg. - Atrial septum: No defect or patent foramen ovale was identified. - Tricuspid valve: There was trivial regurgitation. - Pulmonary arteries: Systolic pressure could not be accurately estimated. - Pericardium, extracardiac: A prominent pericardial fat pad was present  Cath 08/16/18 Conclusions: 1. Significant 2-vessel coronary artery disease, including 90% ostial and 60-70% large OM2 stenoses.  Both lesions are heavily calcified. 2. Mild diffuse disease involving the RCA. 3. Mildly reduced left ventricular systolic function with anterior hypokinesis (LVEF 45-50%). 4. Moderately elevated left ventricular filling pressure (LVEDP 25-30 mmHg).  Recommendations: 1. Optimize volume status, as patient has moderately elevated LVEDP and cannot lie flat.  Will  change furosemide to 40 mg IV BID.  Further titration may be needed based on urine output. 2. Once patient has been optimized, consider PCI to ostial/proximal LAD using atherectomy.  Given poor function status and oxygen-dependent COPD, she is not a good surgical candidate. 3. Medical therapy for 60-70% OM2 stenosis. 4. Aggressive secondary prevention.  Will load with ticagrelor.  Recommend uninterrupted dual antiplatelet therapy with Aspirin 81mg  daily and Ticagrelor 90mg  twice daily for a minimum of 12 months (ACS - Class I recommendation).   Patient Profile     62 y.o. female with a hx ofCOPDand currently being treated for a COPD exacerbation, hyperlipidemia, hypertension,T11 compression fracture and chronic pain,and tobacco abuse who is being seen today for the evaluation ofchest painat the request of Dr. Wynetta Emery.  Assessment & Plan    1. Non-STEMI:   -continue aspirin and ticagrelor -continue atorvasatin -metoprolol being titrated per medicine team due to concern for worsening of respiratory status. Agree, will defer -consider  ARB if renal function stable post cath (likely will be difficult to distinguish if cough is due to ACEI in the future) -brisk diuresis. Renal function stable. Would continue with IV BID furosemide through cath. She states that she is practicing lying flat and feels that she will be able to tolerate it as long as she can have anxiety medication. Her lungs are much improved today, and her LE edema is as well.  -will plan for cath with PCI on 8/22 with Dr. Saunders Revel. NPO after midnight  2. Acute COPD exacerbation: She is on IV Solu-Medrol and Augmentin. She is also on Dulera and albuterol nebs. Elevated WBC likely due to COPD/steroids  3. Hypertension: well controlled today  4. Hyperlipidemia:Continue atorvastatin  5. Bilateral leg edema: improving  6. Tobacco abuse: encourage cessation  For questions or updates, please contact [place] Please consult [email or web address] for contact info under Cardiology/STEMI.      Signed, Buford Dresser, MD  08/18/2018, 2:10 PM

## 2018-08-18 NOTE — Progress Notes (Signed)
Progress Note  Patient Name: Cindy [name] Date of Encounter: 08/18/2018  Primary Cardiologist: No primary care provider on file. ([place])  Subjective   Sitting up eating today, reports that her breathing is much better. Feels that she can lie flat, but pain/anxiety are what limit her more than breathing. Amenable to attempting PCI once she is feeling up to it. No current chest pain.  Inpatient Medications    Scheduled Meds: . amoxicillin-clavulanate  1 tablet Oral Q12H  . aspirin EC  81 mg Oral q morning - 10a  . atorvastatin  40 mg Oral q1800  . calcium-vitamin D  1 tablet Oral BID  . furosemide  40 mg Intravenous BID  . insulin aspart  0-20 Units Subcutaneous TID WC  . insulin aspart  0-5 Units Subcutaneous QHS  . insulin aspart  6 Units Subcutaneous TID WC  . ipratropium-albuterol  3 mL Nebulization TID  . methylPREDNISolone (SOLU-MEDROL) injection  60 mg Intravenous Q12H  . metoprolol tartrate  12.5 mg Oral BID  . mometasone-formoterol  2 puff Inhalation BID  . nicotine  21 mg Transdermal Daily  . pantoprazole  40 mg Oral BID  . pneumococcal 23 valent vaccine  0.5 mL Intramuscular Tomorrow-1000  . polyethylene glycol  17 g Oral Daily  . sodium chloride flush  3 mL Intravenous Q12H  . ticagrelor  90 mg Oral BID   Continuous Infusions: . sodium chloride    . heparin 900 Units/hr (08/18/18 0800)   PRN Meds: sodium chloride, acetaminophen (TYLENOL) oral liquid 160 mg/5 mL, albuterol, ALPRAZolam, alum & mag hydroxide-simeth, diphenhydrAMINE, famotidine, guaiFENesin-dextromethorphan, hydrALAZINE, labetalol, ondansetron **OR** ondansetron (ZOFRAN) IV, oxyCODONE-acetaminophen, sodium chloride, sodium chloride flush   Vital Signs    Vitals:   08/18/18 0352 08/18/18 0736 08/18/18 0823 08/18/18 1327  BP: 136/89  140/79 131/71  Pulse: 96  99 97  Resp: 14  17 12   Temp: 98.1 F (36.7 C)  97.8 F (36.6 C) 98 F (36.7 C)  TempSrc: Oral  Oral Oral  SpO2: 96% 98%  95% 99%  Weight:      Height:        Intake/Output Summary (Last 24 hours) at 08/18/2018 1410 Last data filed at 08/18/2018 1302 Gross per 24 hour  Intake 439.77 ml  Output 1550 ml  Net -1110.23 ml   Filed Weights   08/14/18 0300 08/15/18 0309 08/16/18 0417  Weight: 87.9 kg 89.4 kg 88.8 kg    Telemetry    Sinus rhythm- Personally Reviewed  ECG    No new tracings- Personally Reviewed  Physical Exam   GEN: No acute distress.   Neck: No appreciable JVD but difficult due to body habitus Cardiac: RRR, no murmurs, rubs, or gallops.  Respiratory:  Distant breath sounds but no wheezing or crackles, much improved from yesterday GI: Soft, nontender, non-distended  MS:  trace bilateral pedal edema; No deformity. Neuro:  Nonfocal  Psych: Normal affect   Labs    Chemistry Recent Labs  Lab 08/16/18 0431 08/17/18 0213 08/18/18 0314  NA 139 139 136  K 4.3 3.6 3.5  CL 97* 99 93*  CO2 34* 31 29  GLUCOSE 150* 133* 202*  BUN 19 18 20   CREATININE 0.94 0.93 0.98  CALCIUM 8.9 9.1 9.5  GFRNONAA >60 >60 >60  GFRAA >60 >60 >60  ANIONGAP 8 9 14      Hematology Recent Labs  Lab 08/16/18 0431 08/17/18 0213 08/18/18 0314  WBC 16.6* 18.4* 22.3*  RBC 4.24 4.41 4.69  HGB 12.7 13.3 14.0  HCT 41.7 42.2 44.3  MCV 98.3 95.7 94.5  MCH 30.0 30.2 29.9  MCHC 30.5 31.5 31.6  RDW 14.4 14.3 14.1  PLT 266 262 255    Cardiac Enzymes Recent Labs  Lab 08/12/18 0942 08/12/18 1555 08/12/18 2125  TROPONINI 0.50* 0.50* 0.42*    No results for input(s): TROPIPOC in the last 168 hours.   BNP No results for input(s): BNP, PROBNP in the last 168 hours.   DDimer No results for input(s): DDIMER in the last 168 hours.   Radiology    Dg Chest 2 View  Result Date: 08/18/2018 CLINICAL DATA:  Weakness and shortness of breath today. EXAM: CHEST - 2 VIEW COMPARISON:  Single-view of the chest 08/13/2018. CT chest, abdomen and pelvis 06/04/2018. FINDINGS: The lungs are emphysematous but clear.  Heart size is normal. Aortic atherosclerosis is noted. No pneumothorax or pleural effusion. T11 compression fracture is identified as seen on the prior CT. Vertebral body height loss is up to 90% and worse than on the prior CT. No new fracture is identified. IMPRESSION: No acute disease. Emphysema. T11 compression fracture seen on 06/04/2018 CT scan demonstrates increased vertebral body height loss since that study. Electronically Signed   By: Inge Rise M.D.   On: 08/18/2018 11:28    Cardiac Studies   Echocardiogram 08/11/2018:  Left ventricle: The cavity size was normal. Wall thickness was increased in a pattern of mild LVH. The estimated ejection fraction was 55%. There is hypokinesis of the mid-apicalanteroseptal myocardium. Indeterminate diastolic function. - Aortic valve: Mildly to moderately calcified annulus. Trileaflet; mildly calcified leaflets. Mean gradient (S): 10 mm Hg. Peak gradient (S): 20 mm Hg. Moderately sclerotic to mildly stenotic aortic valve. VTI ratio of LVOT to aortic valve: 0.58. - Mitral valve: There was trivial regurgitation. - Right atrium: Central venous pressure (est): 3 mm Hg. - Atrial septum: No defect or patent foramen ovale was identified. - Tricuspid valve: There was trivial regurgitation. - Pulmonary arteries: Systolic pressure could not be accurately estimated. - Pericardium, extracardiac: A prominent pericardial fat pad was present  Cath 08/16/18 Conclusions: 1. Significant 2-vessel coronary artery disease, including 90% ostial and 60-70% large OM2 stenoses.  Both lesions are heavily calcified. 2. Mild diffuse disease involving the RCA. 3. Mildly reduced left ventricular systolic function with anterior hypokinesis (LVEF 45-50%). 4. Moderately elevated left ventricular filling pressure (LVEDP 25-30 mmHg).  Recommendations: 1. Optimize volume status, as patient has moderately elevated LVEDP and cannot lie flat.  Will  change furosemide to 40 mg IV BID.  Further titration may be needed based on urine output. 2. Once patient has been optimized, consider PCI to ostial/proximal LAD using atherectomy.  Given poor function status and oxygen-dependent COPD, she is not a good surgical candidate. 3. Medical therapy for 60-70% OM2 stenosis. 4. Aggressive secondary prevention.  Will load with ticagrelor.  Recommend uninterrupted dual antiplatelet therapy with Aspirin 81mg  daily and Ticagrelor 90mg  twice daily for a minimum of 12 months (ACS - Class I recommendation).   Patient Profile     62 y.o. female with a hx ofCOPDand currently being treated for a COPD exacerbation, hyperlipidemia, hypertension,T11 compression fracture and chronic pain,and tobacco abuse who is being seen today for the evaluation ofchest painat the request of Dr. Wynetta Emery.  Assessment & Plan    1. Non-STEMI:   -continue aspirin and ticagrelor -continue atorvasatin -metoprolol being titrated per medicine team due to concern for worsening of respiratory status. Agree, will defer -consider  ARB if renal function stable post cath (likely will be difficult to distinguish if cough is due to ACEI in the future) -brisk diuresis. Renal function stable. Would continue with IV BID furosemide through cath. She states that she is practicing lying flat and feels that she will be able to tolerate it as long as she can have anxiety medication. Her lungs are much improved today, and her LE edema is as well.  -will plan for cath with PCI on 8/22 with Dr. Saunders Revel. NPO after midnight  2. Acute COPD exacerbation: She is on IV Solu-Medrol and Augmentin. She is also on Dulera and albuterol nebs. Elevated WBC likely due to COPD/steroids  3. Hypertension: well controlled today  4. Hyperlipidemia:Continue atorvastatin  5. Bilateral leg edema: improving  6. Tobacco abuse: encourage cessation  For questions or updates, please contact [place] Please consult [email or web address] for contact info under Cardiology/STEMI.      Signed, Buford Dresser, MD  08/18/2018, 2:10 PM

## 2018-08-18 NOTE — Progress Notes (Signed)
ANTICOAGULATION CONSULT NOTE  Pharmacy Consult for Heparin Indication: chest pain/ACS, s/p cath  Patient Measurements: Height: 5\' 1"  (154.9 cm) Weight: 195 lb 11.2 oz (88.8 kg)(Scale A) IBW/kg (Calculated) : 47.8 HEPARIN DW (KG): 68.8  Vital Signs: Temp: 97.8 F (36.6 C) (08/21 0823) Temp Source: Oral (08/21 0823) BP: 140/79 (08/21 0823) Pulse Rate: 99 (08/21 0823)  Labs: Recent Labs    08/16/18 0431 08/17/18 0213 08/17/18 1119 08/18/18 0314 08/18/18 0843  HGB 12.7 13.3  --  14.0  --   HCT 41.7 42.2  --  44.3  --   PLT 266 262  --  255  --   HEPARINUNFRC 0.43 0.30 0.47  --  0.53  CREATININE 0.94 0.93  --  0.98  --     Estimated Creatinine Clearance: 60.3 mL/min (by C-G formula based on SCr of 0.98 mg/dL).   Assessment: 62 yo female presenting with acute COPD exacerbation and chest pain. Troponin 0.01>0.5>0.42. Coronary angiography has been scheduled for 08/16/2018.   Anticoag: S/p LHC, Hep gtt for return to cath for [facility] to prox LAD  HL 0.53  Renal: Scr 0.98   Heme/Onc: H&H 14/44.3, Plt 255  Goal of Therapy:  Heparin level 0.3-0.7 units/ml Monitor platelets by anticoagulation protocol: Yes   Plan:  Cont heparin at 900 units/hr Monitor daily HL, CBC, for s/sx of bleeding Back for PCI once optimized  Levester Fresh, PharmD, BCPS, BCCCP Clinical Pharmacist [phone]  Please check AMION for all [place] numbers  08/18/2018 9:53 AM

## 2018-08-19 ENCOUNTER — Other Ambulatory Visit (HOSPITAL_COMMUNITY): Payer: Medicare Other

## 2018-08-19 ENCOUNTER — Encounter (HOSPITAL_COMMUNITY)
Admission: EM | Disposition: A | Discharge status: Home Health | Payer: Self-pay | Source: Home / Self Care | Attending: Family Medicine

## 2018-08-19 ENCOUNTER — Other Ambulatory Visit: Payer: Self-pay | Admitting: Family Medicine

## 2018-08-19 HISTORY — PX: INTRAVASCULAR ULTRASOUND/IVUS: CATH118244

## 2018-08-19 HISTORY — PX: LEFT HEART CATH: CATH118248

## 2018-08-19 HISTORY — PX: CORONARY STENT INTERVENTION: CATH118234

## 2018-08-19 HISTORY — PX: CORONARY ATHERECTOMY: CATH118238

## 2018-08-19 LAB — BASIC METABOLIC PANEL
ANION GAP: 12 (ref 5–15)
BUN: 21 mg/dL (ref 8–23)
CO2: 34 mmol/L — ABNORMAL HIGH (ref 22–32)
Calcium: 9.1 mg/dL (ref 8.9–10.3)
Chloride: 89 mmol/L — ABNORMAL LOW (ref 98–111)
Creatinine, Ser: 1.05 mg/dL — ABNORMAL HIGH (ref 0.44–1.00)
GFR calc Af Amer: >60 mL/min (ref >60)
GFR, EST NON AFRICAN AMERICAN: 56 mL/min — AB (ref >60)
GLUCOSE: 183 mg/dL — AB (ref 70–99)
POTASSIUM: 3.3 mmol/L — AB (ref 3.5–5.1)
Sodium: 135 mmol/L (ref 135–145)

## 2018-08-19 LAB — CBC
HEMATOCRIT: 43.7 % (ref 36.0–46.0)
HEMOGLOBIN: 14.1 g/dL (ref 12.0–15.0)
MCH: 30.1 pg (ref 26.0–34.0)
MCHC: 32.3 g/dL (ref 30.0–36.0)
MCV: 93.4 fL (ref 78.0–100.0)
Platelets: 275 10*3/uL (ref 150–400)
RBC: 4.68 MIL/uL (ref 3.87–5.11)
RDW: 14 % (ref 11.5–15.5)
WBC: 24.2 10*3/uL — ABNORMAL HIGH (ref 4.0–10.5)

## 2018-08-19 LAB — GLUCOSE, CAPILLARY
GLUCOSE-CAPILLARY: 118 mg/dL — AB (ref 70–99)
GLUCOSE-CAPILLARY: 146 mg/dL — AB (ref 70–99)
Glucose-Capillary: 152 mg/dL — ABNORMAL HIGH (ref 70–99)

## 2018-08-19 LAB — HEPARIN LEVEL (UNFRACTIONATED): Heparin Unfractionated: 0.61 IU/mL (ref 0.30–0.70)

## 2018-08-19 LAB — POCT ACTIVATED CLOTTING TIME
ACTIVATED CLOTTING TIME: 268 s
Activated Clotting Time: 340 seconds
Activated Clotting Time: 340 seconds
Activated Clotting Time: 296 seconds

## 2018-08-19 LAB — MRSA PCR SCREENING: MRSA BY PCR: NEGATIVE

## 2018-08-19 SURGERY — CORONARY ATHERECTOMY
Anesthesia: LOCAL

## 2018-08-19 MED ORDER — HEPARIN (PORCINE) IN NACL 1000-0.9 UT/500ML-% IV SOLN
INTRAVENOUS | Status: AC
  Filled 2018-08-19: qty 1000

## 2018-08-19 MED ORDER — NITROGLYCERIN IN D5W 200-5 MCG/ML-% IV SOLN
INTRAVENOUS | Status: AC
  Filled 2018-08-19: qty 250

## 2018-08-19 MED ORDER — IOPAMIDOL (ISOVUE-370) INJECTION 76%
INTRAVENOUS | Status: AC
  Filled 2018-08-19: qty 50

## 2018-08-19 MED ORDER — NITROGLYCERIN 1 MG/10 ML FOR IR/CATH LAB
INTRA_ARTERIAL | Status: AC
  Filled 2018-08-19: qty 10

## 2018-08-19 MED ORDER — LIDOCAINE HCL (PF) 1 % IJ SOLN
INTRAMUSCULAR | Status: AC
  Filled 2018-08-19: qty 30

## 2018-08-19 MED ORDER — TIROFIBAN (AGGRASTAT) BOLUS VIA INFUSION
INTRAVENOUS | Status: DC | PRN
  Administered 2018-08-19: 2132.5 ug via INTRAVENOUS

## 2018-08-19 MED ORDER — VERAPAMIL HCL 2.5 MG/ML IV SOLN
INTRAVENOUS | Status: AC
  Filled 2018-08-19: qty 2

## 2018-08-19 MED ORDER — HEPARIN (PORCINE) IN NACL 1000-0.9 UT/500ML-% IV SOLN
INTRAVENOUS | Status: AC
  Filled 2018-08-19: qty 500

## 2018-08-19 MED ORDER — ADENOSINE (DIAGNOSTIC) FOR INTRACORONARY USE
INTRAVENOUS | Status: DC | PRN
  Administered 2018-08-19 (×2): 30 ug via INTRACORONARY

## 2018-08-19 MED ORDER — LABETALOL HCL 5 MG/ML IV SOLN: 10.0000 mg | INTRAVENOUS | Status: AC | PRN

## 2018-08-19 MED ORDER — FENTANYL CITRATE (PF) 100 MCG/2ML IJ SOLN
INTRAMUSCULAR | Status: AC
  Filled 2018-08-19: qty 2

## 2018-08-19 MED ORDER — MIDAZOLAM HCL 2 MG/2ML IJ SOLN
INTRAMUSCULAR | Status: AC
  Filled 2018-08-19: qty 2

## 2018-08-19 MED ORDER — IOPAMIDOL (ISOVUE-370) INJECTION 76%
INTRAVENOUS | Status: AC
  Filled 2018-08-19: qty 100

## 2018-08-19 MED ORDER — NITROGLYCERIN 1 MG/10 ML FOR IR/CATH LAB
INTRA_ARTERIAL | Status: DC | PRN
  Administered 2018-08-19 (×2): 200 ug via INTRACORONARY

## 2018-08-19 MED ORDER — HEPARIN SODIUM (PORCINE) 1000 UNIT/ML IJ SOLN
INTRAMUSCULAR | Status: AC
  Filled 2018-08-19: qty 1

## 2018-08-19 MED ORDER — SODIUM CHLORIDE 0.9 % IV SOLN: 250.0000 mL | INTRAVENOUS | Status: DC | PRN

## 2018-08-19 MED ORDER — NITROGLYCERIN IN D5W 200-5 MCG/ML-% IV SOLN
INTRAVENOUS | Status: AC | PRN
  Administered 2018-08-19: 10 ug/min via INTRAVENOUS

## 2018-08-19 MED ORDER — SODIUM CHLORIDE 0.9% FLUSH: 3.0000 mL | INTRAVENOUS | Status: DC | PRN

## 2018-08-19 MED ORDER — FUROSEMIDE 10 MG/ML IJ SOLN
40.0000 mg | Freq: Once | INTRAMUSCULAR | Status: AC
  Administered 2018-08-19: 40 mg via INTRAVENOUS
  Filled 2018-08-19: qty 4

## 2018-08-19 MED ORDER — SODIUM CHLORIDE 0.9% FLUSH
3.0000 mL | Freq: Two times a day (BID) | INTRAVENOUS | Status: DC
  Administered 2018-08-19 – 2018-08-21 (×4): 3 mL via INTRAVENOUS

## 2018-08-19 MED ORDER — SODIUM CHLORIDE 0.9 % IV SOLN
INTRAVENOUS | Status: DC
  Administered 2018-08-19: 17:00:00 via INTRAVENOUS

## 2018-08-19 MED ORDER — IOPAMIDOL (ISOVUE-370) INJECTION 76%
INTRAVENOUS | Status: AC
  Filled 2018-08-19: qty 125

## 2018-08-19 MED ORDER — HEPARIN SODIUM (PORCINE) 1000 UNIT/ML IJ SOLN
INTRAMUSCULAR | Status: DC | PRN
  Administered 2018-08-19: 8000 [IU] via INTRAVENOUS

## 2018-08-19 MED ORDER — MIDAZOLAM HCL 2 MG/2ML IJ SOLN
INTRAMUSCULAR | Status: DC | PRN
  Administered 2018-08-19 (×2): 1 mg via INTRAVENOUS

## 2018-08-19 MED ORDER — POTASSIUM CHLORIDE CRYS ER 20 MEQ PO TBCR
40.0000 meq | EXTENDED_RELEASE_TABLET | Freq: Once | ORAL | Status: AC
  Administered 2018-08-19: 40 meq via ORAL
  Filled 2018-08-19: qty 2

## 2018-08-19 MED ORDER — ENOXAPARIN SODIUM 40 MG/0.4ML ~~LOC~~ SOLN
40.0000 mg | SUBCUTANEOUS | Status: DC
  Administered 2018-08-20 – 2018-08-21 (×2): 40 mg via SUBCUTANEOUS
  Filled 2018-08-19 (×3): qty 0.4

## 2018-08-19 MED ORDER — FENTANYL CITRATE (PF) 100 MCG/2ML IJ SOLN
INTRAMUSCULAR | Status: DC | PRN
  Administered 2018-08-19: 50 ug via INTRAVENOUS
  Administered 2018-08-19 (×3): 25 ug via INTRAVENOUS

## 2018-08-19 MED ORDER — TIROFIBAN HCL IV 12.5 MG/250 ML
INTRAVENOUS | Status: AC
  Filled 2018-08-19: qty 250

## 2018-08-19 MED ORDER — HEPARIN (PORCINE) IN NACL 1000-0.9 UT/500ML-% IV SOLN
INTRAVENOUS | Status: DC | PRN
  Administered 2018-08-19 (×3): 500 mL

## 2018-08-19 MED ORDER — ADENOSINE 12 MG/4ML IV SOLN
INTRAVENOUS | Status: AC
  Filled 2018-08-19: qty 4

## 2018-08-19 MED ORDER — VERAPAMIL HCL 2.5 MG/ML IV SOLN
INTRAVENOUS | Status: DC | PRN
  Administered 2018-08-19: 10 mL via INTRA_ARTERIAL

## 2018-08-19 MED ORDER — LIDOCAINE HCL (PF) 1 % IJ SOLN
INTRAMUSCULAR | Status: DC | PRN
  Administered 2018-08-19: 2 mL via INTRADERMAL

## 2018-08-19 MED ORDER — TIROFIBAN HCL IV 12.5 MG/250 ML
INTRAVENOUS | Status: DC | PRN
  Administered 2018-08-19: 0.15 ug/kg/min via INTRAVENOUS

## 2018-08-19 SURGICAL SUPPLY — 30 items
BALLN EMERGE MR 2.5X12 (BALLOONS) ×2
BALLN SAPPHIRE 3.0X15 (BALLOONS) ×2
BALLN SAPPHIRE ~~LOC~~ 3.25X12 (BALLOONS) ×1 IMPLANT
BALLN SPRINTER MX2 OTW 1.5X6 (BALLOONS) ×4
BALLOON EMERGE MR 2.5X12 (BALLOONS) IMPLANT
BALLOON SAPPHIRE 3.0X15 (BALLOONS) IMPLANT
BALLOON SPRINTER MX2 OTW 1.5X6 (BALLOONS) IMPLANT
CATH INFINITI JR4 5F (CATHETERS) ×1 IMPLANT
CATH OPTICROSS 40MHZ (CATHETERS) ×1 IMPLANT
CATH VISTA GUIDE 6FR XBLAD3.5 (CATHETERS) ×1 IMPLANT
CROWN DIAMONDBACK CLASSIC 1.25 (BURR) ×1 IMPLANT
DEVICE RAD COMP TR BAND LRG (VASCULAR PRODUCTS) ×1 IMPLANT
GLIDESHEATH SLEND SS 6F .021 (SHEATH) ×1 IMPLANT
GUIDEWIRE INQWIRE 1.5J.035X260 (WIRE) IMPLANT
INQWIRE 1.5J .035X260CM (WIRE) ×2
KIT ENCORE 26 ADVANTAGE (KITS) ×1 IMPLANT
KIT HEART LEFT (KITS) ×2 IMPLANT
LUBRICANT VIPERSLIDE CORONARY (MISCELLANEOUS) ×1 IMPLANT
PACK CARDIAC CATHETERIZATION (CUSTOM PROCEDURE TRAY) ×2 IMPLANT
PAD ELECT DEFIB RADIOL ZOLL (MISCELLANEOUS) ×1 IMPLANT
SHEATH PROBE COVER 6X72 (BAG) ×1 IMPLANT
SHIELD RADPAD SCOOP 12X17 (MISCELLANEOUS) ×1 IMPLANT
SLED PULL BACK IVUS (MISCELLANEOUS) ×1 IMPLANT
STENT SYNERGY DES 3X20 (Permanent Stent) ×1 IMPLANT
STENT SYNERGY DES 4X12 (Permanent Stent) ×1 IMPLANT
TRANSDUCER W/STOPCOCK (MISCELLANEOUS) ×2 IMPLANT
TUBING CIL FLEX 10 FLL-RA (TUBING) ×2 IMPLANT
WIRE COUGAR XT STRL 300CM (WIRE) ×1 IMPLANT
WIRE RUNTHROUGH .014X300CM (WIRE) ×1 IMPLANT
WIRE VIPER ADVANCE COR .012TIP (WIRE) ×1 IMPLANT

## 2018-08-19 NOTE — Brief Op Note (Signed)
BRIEF CARDIAC CATHETERIZATION REPORT  DATE: 08/19/2018 TIME: 1:30 PM  PATIENT:  Cindy [name]  62 y.o. female  PRE-OPERATIVE DIAGNOSIS:  NSTEMI  POST-OPERATIVE DIAGNOSIS:  NSTEMI  PROCEDURE:  Procedure(s): CORONARY ATHERECTOMY (N/A) CORONARY STENT INTERVENTION (N/A) Intravascular Ultrasound/IVUS (N/A)  SURGEON:  Surgeon(s) and Role:    * Regina Coppolino, Harrell Gave, MD - Primary  FINDINGS: 1. Severe, calcified ostial/proximal LAD stenosis. 2. Complicated IVUS-guided PCI to ostial/proximal LAD using orbital atherectomy and overlapping Synergy 4.0 x 12 mm and 3.0 x 20 mm drug-eluting stents with 0% residual stenosis and TIMI-3 flow.  Procedure was complicated by abrupt closure of the proximal LAD, likely due to dissection during atherectomy.  Vessel was successfully opened with resolution of chest pain. 3. Mildly elevated LVEDP. 4. Right upper arm hematoma from venous bleeding at previous IV site due to intraprocedural anticoagulation.  RECOMMENDATIONS: 1. DAPT with ASA and ticagrelor for at least 12 months. 2. Aggressive secondary prevention. 3. Limited echo to reassess LVEF and exclude pericardial effusion.  Nelva Bush, MD [facility] Pager: [phone]

## 2018-08-19 NOTE — Progress Notes (Signed)
Patient had extreme difficulty remaining still during the exam.  Additionally, patient became tachycardic (140 bpm plus).  As a result, exam will be delayed until HR lowers

## 2018-08-19 NOTE — Progress Notes (Signed)
PROGRESS NOTE    Cindy [name]  OJJ:[id number] DOB: February 29, 1956 DOA: 08/10/2018 PCP: Alycia Rossetti, MD      Brief Narrative:  Cindy [name] is a 62 y.o. 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  While in the hospital, noted to have exertional chest discomfort, troponins cycled and found to have NSTEMI and transferred to [facility] for LHC. -underwent left heart catheterization which showed elevated LVEDP,  started on diuresis  Assessment & Plan:  NSTEMI -Cardiology following, continue aspirin, ticagrelor, statin -Unfortunately may not be able to tolerate metoprolol due to severe COPD, cutdown dose to 12.5 mg twice a day -Cardiology following, status post diagnostic cath,  Plan for left heart catheterization for attempted PCI today  Acute on chronic systolic CHF Found to have E 40% and orthopnea and elevated EDP during LHC on 8/19, started Lasix. -she is 6 L negative as far -We'll hold next dose of IV Lasix and potassium today -and reevaluate following status post catheterization  COPD exacerbation -slowly improving, worsened after addition of metoprolol hence cut down dose -Continue IV Solu-Medrol, day 7 of Augmentin now, start antibiotics -Continue home ICS/LABA -worsening leukocytosis suspect likely secondary to IV steroids and stress demargination, repeat x-ray was unremarkable, remains afebrile -Stop antibiotics tomorrow  Diabetes mellitus type II -stable, continue sliding scale insulin  Smoking Cessation recommended, modalities discussed  GERD -Continue PPI -H2RA PRN  DVT prophylaxis: Not applicable, on heparin Code Status: Full code Family Communication: None present MDM and disposition Plan: home pending cardiac workup and treatment    Consultants:   Cardiology  Procedures:   Echo 8/14 LV EF:  55%  ------------------------------------------------------------------- History:   PMH:  Acquired from the patient and from the patient&'s chart.  Chronic obstructive pulmonary disease.  PMH:  GERD. Tobacco abuse.  Risk factors:  Dyslipidemia.  ------------------------------------------------------------------- Study Conclusions  - Left ventricle: The cavity size was normal. Wall thickness was   increased in a pattern of mild LVH. The estimated ejection   fraction was 55%. There is hypokinesis of the   mid-apicalanteroseptal myocardium. Indeterminate diastolic   function. - Aortic valve: Mildly to moderately calcified annulus. Trileaflet;   mildly calcified leaflets. Mean gradient (S): 10 mm Hg. Peak   gradient (S): 20 mm Hg. Moderately sclerotic to mildly stenotic   aortic valve. VTI ratio of LVOT to aortic valve: 0.58. - Mitral valve: There was trivial regurgitation. - Right atrium: Central venous pressure (est): 3 mm Hg. - Atrial septum: No defect or patent foramen ovale was identified. - Tricuspid valve: There was trivial regurgitation. - Pulmonary arteries: Systolic pressure could not be accurately   estimated. - Pericardium, extracardiac: A prominent pericardial fat pad was   present.   Dopplers 8/14 FINDINGS: RIGHT LOWER EXTREMITY IMPRESSION: 1. No evidence of DVT within either lower extremity. 2. 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   Electronically Signed   By: Sandi Mariscal M.D.   On: 08/11/2018 11:12    Left heart cath Conclusions: 1. Significant 2-vessel coronary artery disease, including 90% ostial and 60-70% large OM2 stenoses.  Both lesions are heavily calcified. 2. Mild diffuse disease involving the RCA. 3. Mildly reduced left ventricular systolic function with anterior hypokinesis (LVEF 45-50%). 4. Moderately elevated left ventricular filling pressure (LVEDP 25-30 mmHg).  Recommendations: 1. Optimize  volume status, as patient has moderately elevated LVEDP and  cannot lie flat.  Will change furosemide to 40 mg IV BID.  Further titration may be needed based on urine output. 2. Once patient has been optimized, consider PCI to ostial/proximal LAD using atherectomy.  Given poor function status and oxygen-dependent COPD, she is not a good surgical candidate. 3. Medical therapy for 60-70% OM2 stenosis. 4. Aggressive secondary prevention.  Will load with ticagrelor.  Recommend uninterrupted dual antiplatelet therapy with Aspirin 81mg  daily and Ticagrelor 90mg  twice daily for a minimum of 12 months (ACS - Class I recommendation).      Antimicrobials:   Augmentin 8/15 >>     Subjective: -breathing better than yesterday, not back to baseline yet, anxious about heart catheterization  Objective: Vitals:   08/19/18 1317 08/19/18 1322 08/19/18 1327 08/19/18 1332  BP: (!) 135/96 (!) 149/90    Pulse: (!) 108 (!) 111 (!) 0 (!) 0  Resp: 15 18    Temp:      TempSrc:      SpO2: 100% 99% 100% (!) 0%  Weight:      Height:        Intake/Output Summary (Last 24 hours) at 08/19/2018 1342 Last data filed at 08/19/2018 1000 Gross per 24 hour  Intake 208.28 ml  Output 2200 ml  Net -1991.72 ml   Filed Weights   08/15/18 0309 08/16/18 0417 08/19/18 0444  Weight: 89.4 kg 88.8 kg 85.3 kg    Examination: Gen: Awake, Alert, Oriented X 3, obese chronically ill female HEENT: PERRLA, Neck supple, no JVD Lungs: improved air movement,faint expiratory wheezes CVS: RRR,No Gallops,Rubs or new Murmurs Abd: soft, Non tender, non distended, BS present Extremities: no edema, positive clubbing Skin: no new rashes Psych: anxious appearing    Data Reviewed: I have personally reviewed following labs and imaging studies:  CBC: Recent Labs  Lab 08/15/18 0258 08/16/18 0431 08/17/18 0213 08/18/18 0314 08/19/18 0326  WBC 13.5* 16.6* 18.4* 22.3* 24.2*  HGB 12.5 12.7 13.3 14.0 14.1  HCT 40.5 41.7 42.2  44.3 43.7  MCV 98.5 98.3 95.7 94.5 93.4  PLT 250 266 262 255 831   Basic Metabolic Panel: Recent Labs  Lab 08/14/18 0409 08/15/18 0258 08/16/18 0431 08/17/18 0213 08/18/18 0314 08/19/18 0326  NA 142 141 139 139 136 135  K 4.2 4.4 4.3 3.6 3.5 3.3*  CL 96* 99 97* 99 93* 89*  CO2 35* 34* 34* 31 29 34*  GLUCOSE 146* 183* 150* 133* 202* 183*  BUN 20 26* 19 18 20 21   CREATININE 0.93 0.99 0.94 0.93 0.98 1.05*  CALCIUM 8.4* 8.8* 8.9 9.1 9.5 9.1  MG 2.2  --   --   --   --   --    GFR: Estimated Creatinine Clearance: 55.1 mL/min (A) (by C-G formula based on SCr of 1.05 mg/dL (H)). Liver Function Tests: No results for input(s): AST, ALT, ALKPHOS, BILITOT, PROT, ALBUMIN in the last 168 hours. No results for input(s): LIPASE, AMYLASE in the last 168 hours. No results for input(s): AMMONIA in the last 168 hours. Coagulation Profile: No results for input(s): INR, PROTIME in the last 168 hours. Cardiac Enzymes: Recent Labs  Lab 08/12/18 1555 08/12/18 2125  TROPONINI 0.50* 0.42*   BNP (last 3 results) No results for input(s): PROBNP in the last 8760 hours. HbA1C: No results for input(s): HGBA1C in the last 72 hours. CBG: Recent Labs  Lab 08/18/18 0617 08/18/18 1054 08/18/18 1632 08/18/18 2046 08/19/18 0554  GLUCAP 210* 131* 174* 126* 118*   Lipid  Profile: No results for input(s): CHOL, HDL, LDLCALC, TRIG, CHOLHDL, LDLDIRECT in the last 72 hours. Thyroid Function Tests: No results for input(s): TSH, T4TOTAL, FREET4, T3FREE, THYROIDAB in the last 72 hours. Anemia Panel: No results for input(s): VITAMINB12, FOLATE, FERRITIN, TIBC, IRON, RETICCTPCT in the last 72 hours. Urine analysis:    Component Value Date/Time   COLORURINE STRAW (A) 06/04/2018 1340   APPEARANCEUR CLEAR 06/04/2018 1340   LABSPEC 1.004 (L) 06/04/2018 1340   PHURINE 9.0 (H) 06/04/2018 1340   GLUCOSEU NEGATIVE 06/04/2018 1340   HGBUR NEGATIVE 06/04/2018 1340   BILIRUBINUR NEGATIVE 06/04/2018 1340    KETONESUR NEGATIVE 06/04/2018 1340   PROTEINUR NEGATIVE 06/04/2018 1340   UROBILINOGEN 0.2 04/15/2014 1949   NITRITE NEGATIVE 06/04/2018 1340   LEUKOCYTESUR NEGATIVE 06/04/2018 1340   Sepsis Labs: @LABRCNTIP (procalcitonin:4,lacticacidven:4)  )No results found for this or any previous visit (from the past 240 hour(s)).       Radiology Studies: Dg Chest 2 View  Result Date: 08/18/2018 CLINICAL DATA:  Weakness and shortness of breath today. EXAM: CHEST - 2 VIEW COMPARISON:  Single-view of the chest 08/13/2018. CT chest, abdomen and pelvis 06/04/2018. FINDINGS: The lungs are emphysematous but clear. Heart size is normal. Aortic atherosclerosis is noted. No pneumothorax or pleural effusion. T11 compression fracture is identified as seen on the prior CT. Vertebral body height loss is up to 90% and worse than on the prior CT. No new fracture is identified. IMPRESSION: No acute disease. Emphysema. T11 compression fracture seen on 06/04/2018 CT scan demonstrates increased vertebral body height loss since that study. Electronically Signed   By: Inge Rise M.D.   On: 08/18/2018 11:28        Scheduled Meds: . [MAR Hold] amoxicillin-clavulanate  1 tablet Oral Q12H  . [MAR Hold] aspirin EC  81 mg Oral q morning - 10a  . [MAR Hold] atorvastatin  40 mg Oral q1800  . [MAR Hold] calcium-vitamin D  1 tablet Oral BID  . [MAR Hold] furosemide  40 mg Intravenous BID  . [MAR Hold] insulin aspart  0-20 Units Subcutaneous TID WC  . [MAR Hold] insulin aspart  0-5 Units Subcutaneous QHS  . [MAR Hold] insulin aspart  6 Units Subcutaneous TID WC  . [MAR Hold] ipratropium-albuterol  3 mL Nebulization TID  . [MAR Hold] methylPREDNISolone (SOLU-MEDROL) injection  60 mg Intravenous Q12H  . [MAR Hold] metoprolol tartrate  12.5 mg Oral BID  . [MAR Hold] mometasone-formoterol  2 puff Inhalation BID  . [MAR Hold] nicotine  21 mg Transdermal Daily  . [MAR Hold] pantoprazole  40 mg Oral BID  . [MAR Hold]  pneumococcal 23 valent vaccine  0.5 mL Intramuscular Tomorrow-1000  . [MAR Hold] polyethylene glycol  17 g Oral Daily  . [MAR Hold] sodium chloride flush  3 mL Intravenous Q12H  . sodium chloride flush  3 mL Intravenous Q12H  . [MAR Hold] ticagrelor  90 mg Oral BID   Continuous Infusions: . [MAR Hold] sodium chloride    . sodium chloride    . sodium chloride 10 mL/hr at 08/19/18 0403  . heparin 900 Units/hr (08/19/18 0400)     LOS: 7 days    Time spent: 35 minutes    Domenic Polite, MD [facility] 08/19/2018, 1:42 PM      please page though AMION:  [email or web address] Password [email or web address] If 7PM-7AM, please contact night-coverage

## 2018-08-19 NOTE — Telephone Encounter (Signed)
Ok to refill 

## 2018-08-19 NOTE — Progress Notes (Signed)
Pulled Oxycodone/APAP 5/325mg  from Pyxis in error and could not return to 4E Pyxis. Spoke to [place] in pharmacy and returned pill to main pharmacy.

## 2018-08-19 NOTE — Progress Notes (Signed)
Patient left unit via stretcher to cath lab. All belongings with husband and daughter as she will be going to different room. Vitals stable, NPO since midnight other than sips for AM meds and no further questions at this time.

## 2018-08-19 NOTE — Interval H&P Note (Signed)
History and Physical Interval Note:  08/19/2018 7:32 AM  [name]  has presented today for surgery, with the diagnosis of cad  The various methods of treatment have been discussed with the patient and family. After consideration of risks, benefits and other options for treatment, the patient has consented to  Procedure(s): CORONARY ATHERECTOMY (N/A) as a surgical intervention .  The patient's history has been reviewed, patient examined, no change in status, stable for surgery.  I have reviewed the patient's chart and labs.  Questions were answered to the patient's satisfaction.    Cath Lab Visit (complete for each Cath Lab visit)  Clinical Evaluation Leading to the Procedure:   ACS: Yes.    Non-ACS:  N/A   Cindy [name]

## 2018-08-20 ENCOUNTER — Inpatient Hospital Stay (HOSPITAL_COMMUNITY): Payer: Medicare Other

## 2018-08-20 ENCOUNTER — Encounter (HOSPITAL_COMMUNITY): Payer: Self-pay | Admitting: Internal Medicine

## 2018-08-20 DIAGNOSIS — J9611 Chronic respiratory failure with hypoxia: Secondary | ICD-10-CM

## 2018-08-20 DIAGNOSIS — I5032 Chronic diastolic (congestive) heart failure: Secondary | ICD-10-CM

## 2018-08-20 DIAGNOSIS — I5033 Acute on chronic diastolic (congestive) heart failure: Secondary | ICD-10-CM

## 2018-08-20 DIAGNOSIS — Z9861 Coronary angioplasty status: Secondary | ICD-10-CM

## 2018-08-20 LAB — ECHOCARDIOGRAM LIMITED
HEIGHTINCHES: 61 in
WEIGHTICAEL: 3008.84 [oz_av]

## 2018-08-20 LAB — BASIC METABOLIC PANEL
ANION GAP: 8 (ref 5–15)
BUN: 26 mg/dL — ABNORMAL HIGH (ref 8–23)
CALCIUM: 8.9 mg/dL (ref 8.9–10.3)
CO2: 33 mmol/L — ABNORMAL HIGH (ref 22–32)
CREATININE: 0.89 mg/dL (ref 0.44–1.00)
Chloride: 94 mmol/L — ABNORMAL LOW (ref 98–111)
GFR calc non Af Amer: >60 mL/min (ref >60)
GLUCOSE: 170 mg/dL — AB (ref 70–99)
Potassium: 3.2 mmol/L — ABNORMAL LOW (ref 3.5–5.1)
Sodium: 135 mmol/L (ref 135–145)

## 2018-08-20 LAB — GLUCOSE, CAPILLARY
GLUCOSE-CAPILLARY: 202 mg/dL — AB (ref 70–99)
GLUCOSE-CAPILLARY: 156 mg/dL — AB (ref 70–99)
Glucose-Capillary: 141 mg/dL — ABNORMAL HIGH (ref 70–99)
Glucose-Capillary: 146 mg/dL — ABNORMAL HIGH (ref 70–99)

## 2018-08-20 LAB — CBC
HCT: 39 % (ref 36.0–46.0)
HEMOGLOBIN: 12.6 g/dL (ref 12.0–15.0)
MCH: 30.1 pg (ref 26.0–34.0)
MCHC: 32.3 g/dL (ref 30.0–36.0)
MCV: 93.1 fL (ref 78.0–100.0)
PLATELETS: 246 10*3/uL (ref 150–400)
RBC: 4.19 MIL/uL (ref 3.87–5.11)
RDW: 14 % (ref 11.5–15.5)
WBC: 26.8 10*3/uL — ABNORMAL HIGH (ref 4.0–10.5)

## 2018-08-20 MED ORDER — ATORVASTATIN CALCIUM 80 MG PO TABS
80.0000 mg | ORAL_TABLET | Freq: Every day | ORAL | Status: DC
  Administered 2018-08-20: 80 mg via ORAL

## 2018-08-20 MED ORDER — FUROSEMIDE 40 MG PO TABS
40.0000 mg | ORAL_TABLET | Freq: Every day | ORAL | Status: DC
  Administered 2018-08-20 – 2018-08-21 (×2): 40 mg via ORAL
  Filled 2018-08-20 (×2): qty 1

## 2018-08-20 MED ORDER — PREDNISONE 20 MG PO TABS
40.0000 mg | ORAL_TABLET | Freq: Every day | ORAL | Status: DC
  Administered 2018-08-20 – 2018-08-21 (×2): 40 mg via ORAL
  Filled 2018-08-20 (×2): qty 2

## 2018-08-20 MED ORDER — POTASSIUM CHLORIDE CRYS ER 20 MEQ PO TBCR
40.0000 meq | EXTENDED_RELEASE_TABLET | ORAL | Status: AC
  Administered 2018-08-20 (×2): 40 meq via ORAL
  Filled 2018-08-20 (×2): qty 2

## 2018-08-20 MED FILL — Adenosine IV Soln 12 MG/4ML: INTRAVENOUS | Qty: 4 | Status: AC

## 2018-08-20 NOTE — Progress Notes (Signed)
Progress Note  Patient Name: Cindy [name] Date of Encounter: 08/20/2018  Primary Cardiologist: ([place])  Subjective   Sitting up this AM, has congestion and cough but feeling better. No chest pain. Significant bruising from right IV infiltration yesterday and above site of left radial cath.  Inpatient Medications    Scheduled Meds: . amoxicillin-clavulanate  1 tablet Oral Q12H  . aspirin EC  81 mg Oral q morning - 10a  . atorvastatin  40 mg Oral q1800  . calcium-vitamin D  1 tablet Oral BID  . enoxaparin (LOVENOX) injection  40 mg Subcutaneous Q24H  . insulin aspart  0-20 Units Subcutaneous TID WC  . insulin aspart  0-5 Units Subcutaneous QHS  . insulin aspart  6 Units Subcutaneous TID WC  . ipratropium-albuterol  3 mL Nebulization TID  . metoprolol tartrate  12.5 mg Oral BID  . mometasone-formoterol  2 puff Inhalation BID  . nicotine  21 mg Transdermal Daily  . pantoprazole  40 mg Oral BID  . pneumococcal 23 valent vaccine  0.5 mL Intramuscular Tomorrow-1000  . polyethylene glycol  17 g Oral Daily  . sodium chloride flush  3 mL Intravenous Q12H  . ticagrelor  90 mg Oral BID   Continuous Infusions: . sodium chloride Stopped (08/19/18 1804)   PRN Meds: sodium chloride, acetaminophen (TYLENOL) oral liquid 160 mg/5 mL, albuterol, ALPRAZolam, alum & mag hydroxide-simeth, diphenhydrAMINE, famotidine, guaiFENesin-dextromethorphan, hydrALAZINE, labetalol, ondansetron **OR** ondansetron (ZOFRAN) IV, oxyCODONE-acetaminophen, sodium chloride, sodium chloride flush   Vital Signs    Vitals:   08/20/18 0600 08/20/18 0700 08/20/18 0806 08/20/18 0807  BP: 133/87 (!) 133/98    Pulse: 93 100    Resp: 12 14    Temp:  98.5 F (36.9 C)    TempSrc:  Oral    SpO2:   95% 95%  Weight:      Height:        Intake/Output Summary (Last 24 hours) at 08/20/2018 0915 Last data filed at 08/19/2018 2300 Gross per 24 hour  Intake 162.78 ml  Output 1600 ml  Net -1437.22 ml   Filed  Weights   08/15/18 0309 08/16/18 0417 08/19/18 0444  Weight: 89.4 kg 88.8 kg 85.3 kg    Telemetry    Sinus rhythm/sinus tachycardia- Personally Reviewed  ECG    Sinus tachycardia, incomplete RBBB- Personally Reviewed  Physical Exam   GEN: No acute distress.   Neck: No appreciable JVD but difficult due to body habitus Cardiac: RRR, no murmurs, rubs, or gallops.  Respiratory:  Distant breath sounds, mild rhonchi in the upper airways GI: Soft, nontender, non-distended  MS:  trace bilateral pedal edema; No deformity. Neuro:  Nonfocal  Psych: Normal affect  Skin: bruising over left forearm, but good pulses and sensation in left hand, radial site with good pulse. Right arm with diffuse ecchymosis but no loss of sensation, tenderness. Warm, good pulses.  Labs    Chemistry Recent Labs  Lab 08/18/18 0314 08/19/18 0326 08/20/18 0346  NA 136 135 135  K 3.5 3.3* 3.2*  CL 93* 89* 94*  CO2 29 34* 33*  GLUCOSE 202* 183* 170*  BUN 20 21 26*  CREATININE 0.98 1.05* 0.89  CALCIUM 9.5 9.1 8.9  GFRNONAA >60 56* >60  GFRAA >60 >60 >60  ANIONGAP 14 12 8      Hematology Recent Labs  Lab 08/18/18 0314 08/19/18 0326 08/20/18 0346  WBC 22.3* 24.2* 26.8*  RBC 4.69 4.68 4.19  HGB 14.0 14.1 12.6  HCT 44.3  43.7 39.0  MCV 94.5 93.4 93.1  MCH 29.9 30.1 30.1  MCHC 31.6 32.3 32.3  RDW 14.1 14.0 14.0  PLT 255 275 246    Cardiac Enzymes No results for input(s): TROPONINI in the last 168 hours.  No results for input(s): TROPIPOC in the last 168 hours.   BNP No results for input(s): BNP, PROBNP in the last 168 hours.   DDimer No results for input(s): DDIMER in the last 168 hours.   Radiology    Dg Chest 2 View  Result Date: 08/18/2018 CLINICAL DATA:  Weakness and shortness of breath today. EXAM: CHEST - 2 VIEW COMPARISON:  Single-view of the chest 08/13/2018. CT chest, abdomen and pelvis 06/04/2018. FINDINGS: The lungs are emphysematous but clear. Heart size is normal. Aortic  atherosclerosis is noted. No pneumothorax or pleural effusion. T11 compression fracture is identified as seen on the prior CT. Vertebral body height loss is up to 90% and worse than on the prior CT. No new fracture is identified. IMPRESSION: No acute disease. Emphysema. T11 compression fracture seen on 06/04/2018 CT scan demonstrates increased vertebral body height loss since that study. Electronically Signed   By: Inge Rise M.D.   On: 08/18/2018 11:28    Cardiac Studies   Echocardiogram 08/11/2018:  Left ventricle: The cavity size was normal. Wall thickness was increased in a pattern of mild LVH. The estimated ejection fraction was 55%. There is hypokinesis of the mid-apicalanteroseptal myocardium. Indeterminate diastolic function. - Aortic valve: Mildly to moderately calcified annulus. Trileaflet; mildly calcified leaflets. Mean gradient (S): 10 mm Hg. Peak gradient (S): 20 mm Hg. Moderately sclerotic to mildly stenotic aortic valve. VTI ratio of LVOT to aortic valve: 0.58. - Mitral valve: There was trivial regurgitation. - Right atrium: Central venous pressure (est): 3 mm Hg. - Atrial septum: No defect or patent foramen ovale was identified. - Tricuspid valve: There was trivial regurgitation. - Pulmonary arteries: Systolic pressure could not be accurately estimated. - Pericardium, extracardiac: A prominent pericardial fat pad was present  Cath 08/16/18 Conclusions: 1. Significant 2-vessel coronary artery disease, including 90% ostial and 60-70% large OM2 stenoses.  Both lesions are heavily calcified. 2. Mild diffuse disease involving the RCA. 3. Mildly reduced left ventricular systolic function with anterior hypokinesis (LVEF 45-50%). 4. Moderately elevated left ventricular filling pressure (LVEDP 25-30 mmHg).  Recommendations: 1. Optimize volume status, as patient has moderately elevated LVEDP and cannot lie flat.  Will change furosemide to 40 mg IV BID.   Further titration may be needed based on urine output. 2. Once patient has been optimized, consider PCI to ostial/proximal LAD using atherectomy.  Given poor function status and oxygen-dependent COPD, she is not a good surgical candidate. 3. Medical therapy for 60-70% OM2 stenosis. 4. Aggressive secondary prevention.  Will load with ticagrelor.  Recommend uninterrupted dual antiplatelet therapy with Aspirin 81mg  daily and Ticagrelor 90mg  twice daily for a minimum of 12 months (ACS - Class I recommendation).   PCI 08/19/18 Conclusions: 5. Severe ostial/proximal LAD stenosis with heavy calcification. 6. Upper normal left ventricular filling pressure. 7. Complex but ultimately successful PCI to ostial and proximal LAD utilizing orbital atherectomy and IVUS with successful placement of overlapping Synergy 4.0 x 12 mm (proximal) and 3.0 x 20 mm (distal) drug eluting stents.  Procedure was complicated by abrupt closure of the proximal LAD during atherectomy, which resolved following stent placement.  Recommendations: 5. Close monitoring in 2-Heart ICU overnight. 6. Obtain limited echo to reevaluate LVEF and exclude pericardial effusion following  complex intervention. 7. Aggressive secondary prevention, including high-intensity statin therapy and smoking cessation. 8. Gentle post-catheterization hydration.  If patient develops worsening shortness of breath, recommend restarting diuresis. 9. Gentle compression with ACE wrap to right upper arm hematoma.  Recommend uninterrupted dual antiplatelet therapy with Aspirin 81mg  daily and Ticagrelor 90mg  twice daily for a minimum of 12 months (ACS - Class I recommendation).   Limited echo 08/20/18 Study Conclusions  - Left ventricle: The cavity size was normal. Systolic function was   normal. The estimated ejection fraction was in the range of 55%   to 60%. Although no diagnostic regional wall motion abnormality   was identified, this possibility  cannot be completely excluded on   the basis of this study. Due to tachycardia, there was fusion of   early and atrial contributions to ventricular filling.  Impressions:  - The previously described anteroseptal wall motion abnormality has   resolved virtually completely.   Patient Profile     62 y.o. female with a hx ofCOPDand currently being treated for a COPD exacerbation, hyperlipidemia, hypertension,T11 compression fracture and chronic pain,and tobacco abuse who is followed for the evaluation ofchest painat the request of Dr. Wynetta Emery.  Assessment & Plan    1. Non-STEMI:  now s/p atherectomy and PCI, see notes above -continue aspirin and ticagrelor -continue atorvastatin, high intensity. Is currently on 40 mg nightly and tolerating, will increase to 80 mg given the NSTEMI presentation and extent of her disease -metoprolol being titrated per medicine team due to concern for worsening of respiratory status. Will defer. -consider ARB if renal function stable post cath (likely will be difficult to distinguish if cough is due to ACEI in the future) -Renal function continues to improve with diuresis. Got light fluids post cath. Stop fluids, will  Restart home furosemide 40 mg PO daily as she appears nearly euvolemic. -giving her 80 mg potassium today for hypokalemia. Monitor daily to determine home supplementation. If ARB started, she may not require potassium in the long term  2. Acute COPD exacerbation: She is on IV Solu-Medrol and Augmentin. She is also on Dulera and albuterol nebs. Elevated WBC likely due to COPD/steroids  3. Hypertension: well controlled today  4. Hyperlipidemia:Continue atorvastatin, with increase as above  5. Bilateral leg edema: improving, nearly resolved  6. Tobacco abuse: encourage cessation. This will be key to preventing progression of her CAD  From a cardiac standpoint, she is doing well. Ok to transfer out of ICU. She will likely need  aggressive PT and continued respiratory care.  For questions or updates, please contact [place] Please consult [email or web address] for contact info under Cardiology/STEMI.      Signed, Buford Dresser, MD  08/20/2018, 9:15 AM

## 2018-08-20 NOTE — Progress Notes (Signed)
CARDIAC REHAB PHASE I   PRE:  Rate/Rhythm: 102 ST    BP: sitting 139/90    SaO2: 99 3L  MODE:  Ambulation: to recliner, stood again and took several steps   POST:  Rate/Rhythm: 126 ST    BP: sitting 160/98     SaO2: 97 3L  Pt willing to get to recliner but refused to have Purewick taken out due to fear of urinating and SOB. She is quite anxious. Agreed to take steps to recliner with Purewick in (limited by cord). Pt SOB and HR up to 126 ST with activity. She moves well. Used RW for support. She sts she lives with a caregiver and walks short distances at home. SaO2 stable with activity. Attempted to discuss education. Pt distracted and tangential at times. She could tell me she needs Brilinta twice a day. Discussed smoking cessation and she accepted fake cigarette. Encouraged her with tips. She would like a prescription for nicotine patch to help with cost. Will refer to [facility] although pt wants to call them when she is ready. [place], ACSM 08/20/2018 2:34 PM

## 2018-08-20 NOTE — Progress Notes (Signed)
  Echocardiogram 2D Echocardiogram has been performed.  Jannett Celestine 08/20/2018, 9:41 AM

## 2018-08-20 NOTE — Progress Notes (Signed)
PROGRESS NOTE    Cindy [name]  AYT:[id number] DOB: [birth year] DOA: 08/10/2018 PCP: Cindy Rossetti, MD      Brief Narrative:  Cindy [name] is a 62 y.o. 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  While in the hospital, noted to have exertional chest discomfort, troponins cycled and found to have NSTEMI and transferred to [facility] for LHC. -underwent left heart catheterization which showed elevated LVEDP and Significant 2-vessel coronary artery disease, including 90% ostial and 60-70% large OM2 stenoses,  started on diuresis -after diuresis and treatment for COPD exacerbation underwent repeat left heart catheterization on 8/22, this noted severe calcified ostial/proximal LAD stenosis treated with complicated PCI and drug-eluting stent.  Assessment & Plan:  NSTEMI -Cardiology following, continue aspirin, ticagrelor, statin -on low-dose metoprolol -Cardiology following, status post diagnostic cath, this noted severe 2 vessel disease and elevated LVEDP, after diuresis, she went back for left heart catheterization yesterday 8/22, she had severe calcified proximal/ostial LAD stenosis treated with complicated PCI/drug-eluting stent -ambulate, out of bed -cardiac rehabilitation  Acute on chronic systolic CHF Found to have E 40% and orthopnea and elevated EDP during LHC on 8/19, started Lasix. -she is 6 L negative as far -now on IV Lasix daily, close to euvolemic status  COPD exacerbation -slowly improving, worsened after addition of metoprolol hence cut down dose -. IV Solu-Medrol, transitioned to prednisone taper -Completed 7 days of antibiotics will discontinue Augmentin -Continue home ICS/LABA -worsening leukocytosis secondary to IV steroids and stress demargination, repeat x-ray was unremarkable, remains afebrile  Diabetes mellitus type II -stable,  continue sliding scale insulin  Smoking Cessation recommended, modalities discussed  GERD -Continue PPI -H2RA PRN  DVT prophylaxis: lovenox Code Status: Full code Family Communication: None present MDM and disposition Plan: home tomorrow if stable    Consultants:   Cardiology  Procedures:   Echo 8/14 LV EF: 55%  ------------------------------------------------------------------- History:   PMH:  Acquired from the patient and from the patient&'s chart.  Chronic obstructive pulmonary disease.  PMH:  GERD. Tobacco abuse.  Risk factors:  Dyslipidemia.  ------------------------------------------------------------------- Study Conclusions  - Left ventricle: The cavity size was normal. Wall thickness was   increased in a pattern of mild LVH. The estimated ejection   fraction was 55%. There is hypokinesis of the   mid-apicalanteroseptal myocardium. Indeterminate diastolic   function. - Aortic valve: Mildly to moderately calcified annulus. Trileaflet;   mildly calcified leaflets. Mean gradient (S): 10 mm Hg. Peak   gradient (S): 20 mm Hg. Moderately sclerotic to mildly stenotic   aortic valve. VTI ratio of LVOT to aortic valve: 0.58. - Mitral valve: There was trivial regurgitation. - Right atrium: Central venous pressure (est): 3 mm Hg. - Atrial septum: No defect or patent foramen ovale was identified. - Tricuspid valve: There was trivial regurgitation. - Pulmonary arteries: Systolic pressure could not be accurately   estimated. - Pericardium, extracardiac: A prominent pericardial fat pad was   present.   Dopplers 8/14 FINDINGS: RIGHT LOWER EXTREMITY IMPRESSION: 1. No evidence of DVT within either lower extremity. 2. 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   Electronically Signed   By: Sandi Mariscal M.D.   On: 08/11/2018 11:12    Left heart cath 8/ Conclusions: 1. Significant 2-vessel coronary artery disease,  including 90% ostial and 60-70% large OM2 stenoses.  Both lesions are heavily calcified.  2. Mild diffuse disease involving the RCA. 3. Mildly reduced left ventricular systolic function with anterior hypokinesis (LVEF 45-50%). 4. Moderately elevated left ventricular filling pressure (LVEDP 25-30 mmHg).  Recommendations: 1. Optimize volume status, as patient has moderately elevated LVEDP and cannot lie flat.  Will change furosemide to 40 mg IV BID.  Further titration may be needed based on urine output. 2. Once patient has been optimized, consider PCI to ostial/proximal LAD using atherectomy.  Given poor function status and oxygen-dependent COPD, she is not a good surgical candidate. 3. Medical therapy for 60-70% OM2 stenosis. 4. Aggressive secondary prevention.  Will load with ticagrelor.   Repeat left heart cath on 8/22 FINDINGS: 1. Severe, calcified ostial/proximal LAD stenosis. 2. Complicated IVUS-guided PCI to ostial/proximal LAD using orbital atherectomy and overlapping Synergy 4.0 x 12 mm and 3.0 x 20 mm drug-eluting stents with 0% residual stenosis and TIMI-3 flow.  Procedure was complicated by abrupt closure of the proximal LAD, likely due to dissection during atherectomy.  Vessel was successfully opened with resolution of chest pain. 3. Mildly elevated LVEDP. 4. Right upper arm hematoma from venous bleeding at previous IV site due to intraprocedural anticoagulation.  RECOMMENDATIONS: 1. DAPT with ASA and ticagrelor for at least 12 months. 2. Aggressive secondary prevention. 3. Limited echo to reassess LVEF and exclude pericardial effusion.    Antimicrobials:   Augmentin 8/15 >>     Subjective: -feels better today breathing improving  Objective: Vitals:   08/20/18 0800 08/20/18 0806 08/20/18 0807 08/20/18 0900  BP: (!) 133/93   137/87  Pulse: (!) 101   (!) 103  Resp: 16   (!) 22  Temp:      TempSrc:      SpO2: 98% 95% 95% 99%  Weight:      Height:         Intake/Output Summary (Last 24 hours) at 08/20/2018 1053 Last data filed at 08/19/2018 2300 Gross per 24 hour  Intake 162.78 ml  Output 800 ml  Net -637.22 ml   Filed Weights   08/15/18 0309 08/16/18 0417 08/19/18 0444  Weight: 89.4 kg 88.8 kg 85.3 kg    Examination: Gen: Awake, Alert, Oriented X 3, obese chronically ill female sitting up in bed, no distress HEENT: PERRLA, Neck supple, no JVD Lungs: improved air movement, rare expiratory wheezes CVS: RRR,No Gallops,Rubs or new Murmurs Abd: soft, Non tender, non distended, BS present Extremities: trace edema Skin: no new rashes    Data Reviewed: I have personally reviewed following labs and imaging studies:  CBC: Recent Labs  Lab 08/16/18 0431 08/17/18 0213 08/18/18 0314 08/19/18 0326 08/20/18 0346  WBC 16.6* 18.4* 22.3* 24.2* 26.8*  HGB 12.7 13.3 14.0 14.1 12.6  HCT 41.7 42.2 44.3 43.7 39.0  MCV 98.3 95.7 94.5 93.4 93.1  PLT 266 262 255 275 096   Basic Metabolic Panel: Recent Labs  Lab 08/14/18 0409  08/16/18 0431 08/17/18 0213 08/18/18 0314 08/19/18 0326 08/20/18 0346  NA 142   < > 139 139 136 135 135  K 4.2   < > 4.3 3.6 3.5 3.3* 3.2*  CL 96*   < > 97* 99 93* 89* 94*  CO2 35*   < > 34* 31 29 34* 33*  GLUCOSE 146*   < > 150* 133* 202* 183* 170*  BUN 20   < > 19 18 20 21  26*  CREATININE 0.93   < > 0.94 0.93 0.98 1.05* 0.89  CALCIUM 8.4*   < > 8.9 9.1  9.5 9.1 8.9  MG 2.2  --   --   --   --   --   --    < > = values in this interval not displayed.   GFR: Estimated Creatinine Clearance: 65 mL/min (by C-G formula based on SCr of 0.89 mg/dL). Liver Function Tests: No results for input(s): AST, ALT, ALKPHOS, BILITOT, PROT, ALBUMIN in the last 168 hours. No results for input(s): LIPASE, AMYLASE in the last 168 hours. No results for input(s): AMMONIA in the last 168 hours. Coagulation Profile: No results for input(s): INR, PROTIME in the last 168 hours. Cardiac Enzymes: No results for input(s): CKTOTAL,  CKMB, CKMBINDEX, TROPONINI in the last 168 hours. BNP (last 3 results) No results for input(s): PROBNP in the last 8760 hours. HbA1C: No results for input(s): HGBA1C in the last 72 hours. CBG: Recent Labs  Lab 08/18/18 2046 08/19/18 0554 08/19/18 1604 08/19/18 2151 08/20/18 0849  GLUCAP 126* 118* 146* 152* 202*   Lipid Profile: No results for input(s): CHOL, HDL, LDLCALC, TRIG, CHOLHDL, LDLDIRECT in the last 72 hours. Thyroid Function Tests: No results for input(s): TSH, T4TOTAL, FREET4, T3FREE, THYROIDAB in the last 72 hours. Anemia Panel: No results for input(s): VITAMINB12, FOLATE, FERRITIN, TIBC, IRON, RETICCTPCT in the last 72 hours. Urine analysis:    Component Value Date/Time   COLORURINE STRAW (A) 06/04/2018 1340   APPEARANCEUR CLEAR 06/04/2018 1340   LABSPEC 1.004 (L) 06/04/2018 1340   PHURINE 9.0 (H) 06/04/2018 1340   GLUCOSEU NEGATIVE 06/04/2018 1340   HGBUR NEGATIVE 06/04/2018 1340   BILIRUBINUR NEGATIVE 06/04/2018 1340   KETONESUR NEGATIVE 06/04/2018 1340   PROTEINUR NEGATIVE 06/04/2018 1340   UROBILINOGEN 0.2 04/15/2014 1949   NITRITE NEGATIVE 06/04/2018 1340   LEUKOCYTESUR NEGATIVE 06/04/2018 1340   Sepsis Labs: @LABRCNTIP (procalcitonin:4,lacticacidven:4)  ) Recent Results (from the past 240 hour(s))  MRSA PCR Screening     Status: None   Collection Time: 08/19/18  3:33 PM  Result Value Ref Range Status   MRSA by PCR NEGATIVE NEGATIVE Final    Comment:        The GeneXpert MRSA Assay (FDA approved for NASAL specimens only), is one component of a comprehensive MRSA colonization surveillance program. It is not intended to diagnose MRSA infection nor to guide or monitor treatment for MRSA infections. Performed at [facility], [place] [address] [place], [place] [address]          Radiology Studies: Dg Chest 2 View  Result Date: 08/18/2018 CLINICAL DATA:  Weakness and shortness of breath today. EXAM: CHEST - 2 VIEW COMPARISON:   Single-view of the chest 08/13/2018. CT chest, abdomen and pelvis 06/04/2018. FINDINGS: The lungs are emphysematous but clear. Heart size is normal. Aortic atherosclerosis is noted. No pneumothorax or pleural effusion. T11 compression fracture is identified as seen on the prior CT. Vertebral body height loss is up to 90% and worse than on the prior CT. No new fracture is identified. IMPRESSION: No acute disease. Emphysema. T11 compression fracture seen on 06/04/2018 CT scan demonstrates increased vertebral body height loss since that study. Electronically Signed   By: Inge Rise M.D.   On: 08/18/2018 11:28        Scheduled Meds: . amoxicillin-clavulanate  1 tablet Oral Q12H  . aspirin EC  81 mg Oral q morning - 10a  . atorvastatin  80 mg Oral q1800  . calcium-vitamin D  1 tablet Oral BID  . enoxaparin (LOVENOX) injection  40 mg Subcutaneous Q24H  .  furosemide  40 mg Oral Daily  . insulin aspart  0-20 Units Subcutaneous TID WC  . insulin aspart  0-5 Units Subcutaneous QHS  . insulin aspart  6 Units Subcutaneous TID WC  . ipratropium-albuterol  3 mL Nebulization TID  . metoprolol tartrate  12.5 mg Oral BID  . mometasone-formoterol  2 puff Inhalation BID  . nicotine  21 mg Transdermal Daily  . pantoprazole  40 mg Oral BID  . pneumococcal 23 valent vaccine  0.5 mL Intramuscular Tomorrow-1000  . polyethylene glycol  17 g Oral Daily  . potassium chloride  40 mEq Oral Q4H  . predniSONE  40 mg Oral Q breakfast  . sodium chloride flush  3 mL Intravenous Q12H  . ticagrelor  90 mg Oral BID   Continuous Infusions: . sodium chloride Stopped (08/19/18 1804)     LOS: 8 days    Time spent: 35 minutes    Domenic Polite, MD [facility] 08/20/2018, 10:53 AM      please page though AMION:  [email or web address] Password [email or web address] If 7PM-7AM, please contact night-coverage

## 2018-08-20 NOTE — Plan of Care (Signed)
  Problem: Education: Goal: Knowledge of General Education information will improve Description: Including pain rating scale, medication(s)/side effects and non-pharmacologic comfort measures Outcome: Progressing   Problem: Health Behavior/Discharge Planning: Goal: Ability to manage health-related needs will improve Outcome: Progressing   Problem: Clinical Measurements: Goal: Ability to maintain clinical measurements within normal limits will improve Outcome: Progressing Goal: Will remain free from infection Outcome: Progressing Goal: Diagnostic test results will improve Outcome: Progressing Goal: Respiratory complications will improve Outcome: Progressing Goal: Cardiovascular complication will be avoided Outcome: Progressing   Problem: Coping: Goal: Level of anxiety will decrease Outcome: Progressing   Problem: Safety: Goal: Ability to remain free from injury will improve Outcome: Progressing   Problem: Skin Integrity: Goal: Risk for impaired skin integrity will decrease Outcome: Progressing   

## 2018-08-21 LAB — GLUCOSE, CAPILLARY: Glucose-Capillary: 131 mg/dL — ABNORMAL HIGH (ref 70–99)

## 2018-08-21 MED ORDER — PREDNISONE 10 MG PO TABS: 10.0000 mg | ORAL_TABLET | Freq: Every day | ORAL | Status: DC

## 2018-08-21 MED ORDER — METOPROLOL TARTRATE 25 MG PO TABS: 12.5000 mg | ORAL_TABLET | Freq: Two times a day (BID) | ORAL | 0 refills | Status: DC

## 2018-08-21 MED ORDER — TICAGRELOR 90 MG PO TABS: 90.0000 mg | ORAL_TABLET | Freq: Two times a day (BID) | ORAL | 0 refills | Status: DC

## 2018-08-21 MED ORDER — NICOTINE 21 MG/24HR TD PT24: 21.0000 mg | MEDICATED_PATCH | Freq: Every day | TRANSDERMAL | 0 refills | Status: DC

## 2018-08-21 MED ORDER — ATORVASTATIN CALCIUM 80 MG PO TABS: 80.0000 mg | ORAL_TABLET | Freq: Every day | ORAL | 0 refills | Status: DC

## 2018-08-21 NOTE — Care Management Note (Signed)
Case Management Note  Patient Details  Name: Cindy [name] MRN: [id number] Date of Birth: April 26, 1956  Subjective/Objective:                    Action/Plan:  Spoke w patient and she is agreeable for [facility] services. Chose [facility], referral placed to [place]. No other CM needs.   Expected Discharge Date:  08/21/18               Expected Discharge Plan:  Home/Self Care  In-House Referral:     Discharge planning Services  CM Consult, Medication Assistance  Post Acute Care Choice:  NA Choice offered to:  NA  DME Arranged:    DME Agency:     HH Arranged:  PT, OT HH Agency:  [place]  Status of Service:  Completed, signed off  If discussed at [place] of Stay Meetings, dates discussed:    Additional Comments:  Carles Collet, RN 08/21/2018, 11:22 AM

## 2018-08-21 NOTE — Evaluation (Signed)
Physical Therapy Evaluation Patient Details Name: Cindy [name] MRN: [id number] DOB: Feb 09, 1956 Today's Date: 08/21/2018   History of Present Illness  Cindy [name] is a 62 y.o. 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  While in the hospital, noted to have exertional chest discomfort, troponins cycled and found to have NSTEMI and transferred to [facility] for LHC now s/p PCI and DES 8/22.      Clinical Impression  Pt admitted with above diagnosis. Pt currently with functional limitations due to the deficits listed below (see PT Problem List). PTA, pt living in mobile home with friend/cartaker, household ambulation with RW on home O2 3L. Patient supervision level for mobility today, limited activity tolerance. Ambulating in hospital room with RW on 3L without desatting, HR 110. Patient initially refusing any follow up therapy, extensive conversation with benefits and she is now open minded provided CM can discuss insurance coverage with her for HHPT vs OP cardiac rehab.  Pt will benefit from skilled PT to increase their independence and safety with mobility to allow discharge to the venue listed below.       Follow Up Recommendations Home health PT;Outpatient PT(OP cardiac rehab )    Equipment Recommendations  None recommended by PT    Recommendations for Other Services       Precautions / Restrictions Precautions Precautions: None Restrictions Weight Bearing Restrictions: No      Mobility  Bed Mobility Overal bed mobility: Independent                Transfers Overall transfer level: Modified independent Equipment used: Rolling walker (2 wheeled)             General transfer comment: pt able to stand without assistance with RW  Ambulation/Gait Ambulation/Gait assistance: Supervision Gait Distance (Feet): 20 Feet Assistive device: Rolling  walker (2 wheeled) Gait Pattern/deviations: Step-to pattern Gait velocity: normal   General Gait Details: pt walking with poor activity tolerence, no LOB but SOB quickly  Stairs            Wheelchair Mobility    Modified Rankin (Stroke Patients Only)       Balance Overall balance assessment: Needs assistance   Sitting balance-Leahy Scale: Good       Standing balance-Leahy Scale: Fair                               Pertinent Vitals/Pain      Home Living Family/patient expects to be discharged to:: Private residence Living Arrangements: Non-relatives/Friends Available Help at Discharge: Friend(s);Available 24 hours/day Type of Home: Mobile home Home Access: Stairs to enter Entrance Stairs-Rails: Right;Left;Can reach both Entrance Stairs-Number of Steps: 4 Home Layout: One level Home Equipment: Walker - 2 wheels;Wheelchair - manual;Shower seat      Prior Function Level of Independence: Independent with assistive device(s)   Gait / Transfers Assistance Needed: household & short distanced community ambulator with RW PRN  ADL's / Homemaking Assistance Needed: assisted by friends  Comments: 3L home O2, RW for household ambulation.      Hand Dominance   Dominant Hand: Right    Extremity/Trunk Assessment   Upper Extremity Assessment Upper Extremity Assessment: Overall WFL for tasks assessed    Lower Extremity Assessment Lower Extremity Assessment: Overall WFL for tasks assessed       Communication  Communication: No difficulties  Cognition Arousal/Alertness: Awake/alert Behavior During Therapy: WFL for tasks assessed/performed Overall Cognitive Status: Within Functional Limits for tasks assessed                                        General Comments General comments (skin integrity, edema, etc.): Extensive discussion with pt over benefits of ongoing therapy and cardiac rehab    Exercises     Assessment/Plan     PT Assessment Patient needs continued PT services  PT Problem List Decreased strength;Decreased range of motion;Decreased activity tolerance;Decreased balance;Decreased mobility       PT Treatment Interventions DME instruction;Gait training;Stair training;Functional mobility training;Therapeutic activities;Therapeutic exercise;Balance training    PT Goals (Current goals can be found in the Care Plan section)  Acute Rehab PT Goals Patient Stated Goal: return home PT Goal Formulation: With patient Time For Goal Achievement: 08/28/18 Potential to Achieve Goals: Fair    Frequency Min 3X/week   Barriers to discharge        Co-evaluation               AM-PAC PT "6 Clicks" Daily Activity  Outcome Measure Difficulty turning over in bed (including adjusting bedclothes, sheets and blankets)?: None Difficulty moving from lying on back to sitting on the side of the bed? : None Difficulty sitting down on and standing up from a chair with arms (e.g., wheelchair, bedside commode, etc,.)?: None Help needed moving to and from a bed to chair (including a wheelchair)?: A Little Help needed walking in hospital room?: A Little Help needed climbing 3-5 steps with a railing? : A Little 6 Click Score: 21    End of Session Equipment Utilized During Treatment: Gait belt Activity Tolerance: Patient tolerated treatment well Patient left: in bed;with call bell/phone within reach Nurse Communication: Mobility status PT Visit Diagnosis: Unsteadiness on feet (R26.81);Other abnormalities of gait and mobility (R26.89);Muscle weakness (generalized) (M62.81)    Time: 0277-4128 PT Time Calculation (min) (ACUTE ONLY): 25 min   Charges:   PT Evaluation $PT Eval Moderate Complexity: 1 Mod PT Treatments $Gait Training: 8-22 mins        Reinaldo Berber, PT, DPT Acute Rehab Services Pager: [phone]    Reinaldo Berber 08/21/2018, 9:56 AM

## 2018-08-21 NOTE — Progress Notes (Signed)
Progress Note  Patient Name: Cindy [name] Date of Encounter: 08/21/2018  Primary Cardiologist: ([place])  Subjective   Wants to go home no chest pain breathing better   Inpatient Medications    Scheduled Meds: . aspirin EC  81 mg Oral q morning - 10a  . atorvastatin  80 mg Oral q1800  . calcium-vitamin D  1 tablet Oral BID  . enoxaparin (LOVENOX) injection  40 mg Subcutaneous Q24H  . furosemide  40 mg Oral Daily  . insulin aspart  0-20 Units Subcutaneous TID WC  . insulin aspart  0-5 Units Subcutaneous QHS  . insulin aspart  6 Units Subcutaneous TID WC  . ipratropium-albuterol  3 mL Nebulization TID  . metoprolol tartrate  12.5 mg Oral BID  . mometasone-formoterol  2 puff Inhalation BID  . nicotine  21 mg Transdermal Daily  . pantoprazole  40 mg Oral BID  . pneumococcal 23 valent vaccine  0.5 mL Intramuscular Tomorrow-1000  . polyethylene glycol  17 g Oral Daily  . predniSONE  40 mg Oral Q breakfast  . sodium chloride flush  3 mL Intravenous Q12H  . ticagrelor  90 mg Oral BID   Continuous Infusions: . sodium chloride Stopped (08/19/18 1804)   PRN Meds: sodium chloride, acetaminophen (TYLENOL) oral liquid 160 mg/5 mL, albuterol, ALPRAZolam, alum & mag hydroxide-simeth, diphenhydrAMINE, famotidine, guaiFENesin-dextromethorphan, hydrALAZINE, labetalol, ondansetron **OR** ondansetron (ZOFRAN) IV, oxyCODONE-acetaminophen, sodium chloride, sodium chloride flush   Vital Signs    Vitals:   08/20/18 2008 08/21/18 0438 08/21/18 0737 08/21/18 0854  BP:  111/75  109/85  Pulse:  98  (!) 103  Resp:  20  20  Temp:  97.8 F (36.6 C)  97.9 F (36.6 C)  TempSrc:  Oral  Oral  SpO2: 96% 96% 97% 100%  Weight:  83.9 kg    Height:        Intake/Output Summary (Last 24 hours) at 08/21/2018 0920 Last data filed at 08/21/2018 0910 Gross per 24 hour  Intake 720 ml  Output 1900 ml  Net -1180 ml   Filed Weights   08/19/18 0444 08/20/18 1824 08/21/18 0438  Weight: 85.3 kg  84.2 kg 83.9 kg    Telemetry    NSR / ST 08/21/2018 - Personally Reviewed  ECG    Sinus tachycardia, incomplete RBBB- Personally Reviewed  Physical Exam   Affect appropriate Chronically ill obese white female  HEENT: normal Neck supple with no adenopathy JVP normal no bruits no thyromegaly Lungs exp wheezing and good diaphragmatic motion Heart:  S1/S2 no murmur, no rub, gallop or click PMI normal Abdomen: benighn, BS positve, no tenderness, no AAA no bruit.  No HSM or HJR Marked echymosis and bruising RUE form cath  No edema Neuro non-focal Skin warm and dry No muscular weakness   Labs    Chemistry Recent Labs  Lab 08/18/18 0314 08/19/18 0326 08/20/18 0346  NA 136 135 135  K 3.5 3.3* 3.2*  CL 93* 89* 94*  CO2 29 34* 33*  GLUCOSE 202* 183* 170*  BUN 20 21 26*  CREATININE 0.98 1.05* 0.89  CALCIUM 9.5 9.1 8.9  GFRNONAA >60 56* >60  GFRAA >60 >60 >60  ANIONGAP 14 12 8      Hematology Recent Labs  Lab 08/18/18 0314 08/19/18 0326 08/20/18 0346  WBC 22.3* 24.2* 26.8*  RBC 4.69 4.68 4.19  HGB 14.0 14.1 12.6  HCT 44.3 43.7 39.0  MCV 94.5 93.4 93.1  MCH 29.9 30.1 30.1  MCHC 31.6 32.3  32.3  RDW 14.1 14.0 14.0  PLT 255 275 246    Cardiac Enzymes No results for input(s): TROPONINI in the last 168 hours.  No results for input(s): TROPIPOC in the last 168 hours.   BNP No results for input(s): BNP, PROBNP in the last 168 hours.   DDimer No results for input(s): DDIMER in the last 168 hours.   Radiology    No results found.  Cardiac Studies   Echocardiogram 08/11/2018:  Left ventricle: The cavity size was normal. Wall thickness was increased in a pattern of mild LVH. The estimated ejection fraction was 55%. There is hypokinesis of the mid-apicalanteroseptal myocardium. Indeterminate diastolic function. - Aortic valve: Mildly to moderately calcified annulus. Trileaflet; mildly calcified leaflets. Mean gradient (S): 10 mm Hg.  Peak gradient (S): 20 mm Hg. Moderately sclerotic to mildly stenotic aortic valve. VTI ratio of LVOT to aortic valve: 0.58. - Mitral valve: There was trivial regurgitation. - Right atrium: Central venous pressure (est): 3 mm Hg. - Atrial septum: No defect or patent foramen ovale was identified. - Tricuspid valve: There was trivial regurgitation. - Pulmonary arteries: Systolic pressure could not be accurately estimated. - Pericardium, extracardiac: A prominent pericardial fat pad was present  Cath 08/16/18 Conclusions: 1. Significant 2-vessel coronary artery disease, including 90% ostial and 60-70% large OM2 stenoses.  Both lesions are heavily calcified. 2. Mild diffuse disease involving the RCA. 3. Mildly reduced left ventricular systolic function with anterior hypokinesis (LVEF 45-50%). 4. Moderately elevated left ventricular filling pressure (LVEDP 25-30 mmHg).  Recommendations: 1. Optimize volume status, as patient has moderately elevated LVEDP and cannot lie flat.  Will change furosemide to 40 mg IV BID.  Further titration may be needed based on urine output. 2. Once patient has been optimized, consider PCI to ostial/proximal LAD using atherectomy.  Given poor function status and oxygen-dependent COPD, she is not a good surgical candidate. 3. Medical therapy for 60-70% OM2 stenosis. 4. Aggressive secondary prevention.  Will load with ticagrelor.  Recommend uninterrupted dual antiplatelet therapy with Aspirin 81mg  daily and Ticagrelor 90mg  twice daily for a minimum of 12 months (ACS - Class I recommendation).   PCI 08/19/18 Conclusions: 5. Severe ostial/proximal LAD stenosis with heavy calcification. 6. Upper normal left ventricular filling pressure. 7. Complex but ultimately successful PCI to ostial and proximal LAD utilizing orbital atherectomy and IVUS with successful placement of overlapping Synergy 4.0 x 12 mm (proximal) and 3.0 x 20 mm (distal) drug eluting stents.   Procedure was complicated by abrupt closure of the proximal LAD during atherectomy, which resolved following stent placement.  Recommendations: 5. Close monitoring in 2-Heart ICU overnight. 6. Obtain limited echo to reevaluate LVEF and exclude pericardial effusion following complex intervention. 7. Aggressive secondary prevention, including high-intensity statin therapy and smoking cessation. 8. Gentle post-catheterization hydration.  If patient develops worsening shortness of breath, recommend restarting diuresis. 9. Gentle compression with ACE wrap to right upper arm hematoma.  Recommend uninterrupted dual antiplatelet therapy with Aspirin 81mg  daily and Ticagrelor 90mg  twice daily for a minimum of 12 months (ACS - Class I recommendation).   Limited echo 08/20/18 Study Conclusions  - Left ventricle: The cavity size was normal. Systolic function was   normal. The estimated ejection fraction was in the range of 55%   to 60%. Although no diagnostic regional wall motion abnormality   was identified, this possibility cannot be completely excluded on   the basis of this study. Due to tachycardia, there was fusion of   early and  atrial contributions to ventricular filling.  Impressions:  - The previously described anteroseptal wall motion abnormality has   resolved virtually completely.   Patient Profile     62 y.o. female with a hx ofCOPDand currently being treated for a COPD exacerbation, hyperlipidemia, hypertension,T11 compression fracture and chronic pain,and tobacco abuse who is followed for the evaluation ofchest painat the request of Dr. Wynetta Emery.  Assessment & Plan    1. Non-STEMI:  now s/p atherectomy and PCI, see notes above -continue aspirin and ticagrelor and statin   2. Acute COPD exacerbation: She is on IV Solu-Medrol and Augmentin. She is also on Dulera and albuterol nebs. Elevated WBC likely due to COPD/steroids per primary service   3. Hypertension:  Well controlled.  Continue current medications and low sodium Dash type diet.    4. Hyperlipidemia:statin dose increased   5. Bilateral leg edema: improved   6. Tobacco abuse: encourage cessation. This will be key to preventing progression of her CAD  Ok to d/c home from our perspective Will sign off  Will arrange f/u with Dr Bronson Ing in [place]   For questions or updates, please contact [place] Please consult [email or web address] for contact info under Cardiology/STEMI.      Signed, Jenkins Rouge, MD  08/21/2018, 9:20 AM

## 2018-08-23 ENCOUNTER — Emergency Department (HOSPITAL_COMMUNITY): Payer: Medicare Other

## 2018-08-23 ENCOUNTER — Ambulatory Visit: Payer: Medicare Other | Admitting: Emergency Medicine

## 2018-08-23 ENCOUNTER — Encounter (HOSPITAL_COMMUNITY): Payer: Self-pay | Admitting: Emergency Medicine

## 2018-08-23 ENCOUNTER — Emergency Department (HOSPITAL_COMMUNITY)
Admission: EM | Admit: 2018-08-23 | Discharge: 2018-08-23 | Disposition: A | Discharge status: Home / Self Care | Payer: Medicare Other | Attending: Emergency Medicine | Admitting: Emergency Medicine

## 2018-08-23 ENCOUNTER — Other Ambulatory Visit: Payer: Self-pay

## 2018-08-23 DIAGNOSIS — M79661 Pain in right lower leg: Secondary | ICD-10-CM | POA: Diagnosis not present

## 2018-08-23 DIAGNOSIS — M545 Low back pain: Secondary | ICD-10-CM | POA: Diagnosis not present

## 2018-08-23 DIAGNOSIS — R0602 Shortness of breath: Secondary | ICD-10-CM | POA: Diagnosis not present

## 2018-08-23 DIAGNOSIS — Z79899 Other long term (current) drug therapy: Secondary | ICD-10-CM | POA: Insufficient documentation

## 2018-08-23 DIAGNOSIS — M79604 Pain in right leg: Secondary | ICD-10-CM | POA: Diagnosis not present

## 2018-08-23 DIAGNOSIS — M5416 Radiculopathy, lumbar region: Secondary | ICD-10-CM | POA: Diagnosis not present

## 2018-08-23 DIAGNOSIS — I214 Non-ST elevation (NSTEMI) myocardial infarction: Secondary | ICD-10-CM | POA: Diagnosis not present

## 2018-08-23 DIAGNOSIS — J449 Chronic obstructive pulmonary disease, unspecified: Secondary | ICD-10-CM | POA: Diagnosis not present

## 2018-08-23 DIAGNOSIS — M5410 Radiculopathy, site unspecified: Secondary | ICD-10-CM | POA: Diagnosis not present

## 2018-08-23 DIAGNOSIS — F17210 Nicotine dependence, cigarettes, uncomplicated: Secondary | ICD-10-CM | POA: Insufficient documentation

## 2018-08-23 DIAGNOSIS — Z7982 Long term (current) use of aspirin: Secondary | ICD-10-CM | POA: Insufficient documentation

## 2018-08-23 DIAGNOSIS — R600 Localized edema: Secondary | ICD-10-CM | POA: Diagnosis not present

## 2018-08-23 DIAGNOSIS — R05 Cough: Secondary | ICD-10-CM | POA: Diagnosis not present

## 2018-08-23 LAB — CBC WITH DIFFERENTIAL/PLATELET
BASOS ABS: 0 10*3/uL (ref 0.0–0.1)
Basophils Relative: 0 %
EOS ABS: 0 10*3/uL (ref 0.0–0.7)
Eosinophils Relative: 0 %
HCT: 37.6 % (ref 36.0–46.0)
Hemoglobin: 12.1 g/dL (ref 12.0–15.0)
Lymphocytes Relative: 15 %
Lymphs Abs: 4.1 10*3/uL — ABNORMAL HIGH (ref 0.7–4.0)
MCH: 30.4 pg (ref 26.0–34.0)
MCHC: 32.2 g/dL (ref 30.0–36.0)
MCV: 94.5 fL (ref 78.0–100.0)
MONO ABS: 2.5 10*3/uL — AB (ref 0.1–1.0)
Monocytes Relative: 9 %
NEUTROS PCT: 76 %
Neutro Abs: 20.7 10*3/uL — ABNORMAL HIGH (ref 1.7–7.7)
PLATELETS: 250 10*3/uL (ref 150–400)
RBC: 3.98 MIL/uL (ref 3.87–5.11)
RDW: 14.4 % (ref 11.5–15.5)
WBC: 27.3 10*3/uL — AB (ref 4.0–10.5)

## 2018-08-23 LAB — COMPREHENSIVE METABOLIC PANEL
ALT: 66 U/L — ABNORMAL HIGH (ref 0–44)
AST: 38 U/L (ref 15–41)
Albumin: 4 g/dL (ref 3.5–5.0)
Alkaline Phosphatase: 54 U/L (ref 38–126)
Anion gap: 11 (ref 5–15)
BUN: 18 mg/dL (ref 8–23)
CHLORIDE: 90 mmol/L — AB (ref 98–111)
CO2: 34 mmol/L — AB (ref 22–32)
CREATININE: 0.8 mg/dL (ref 0.44–1.00)
Calcium: 10.5 mg/dL — ABNORMAL HIGH (ref 8.9–10.3)
GFR calc Af Amer: >60 mL/min (ref >60)
GFR calc non Af Amer: >60 mL/min (ref >60)
Glucose, Bld: 137 mg/dL — ABNORMAL HIGH (ref 70–99)
POTASSIUM: 3.7 mmol/L (ref 3.5–5.1)
SODIUM: 135 mmol/L (ref 135–145)
Total Bilirubin: 1.5 mg/dL — ABNORMAL HIGH (ref 0.3–1.2)
Total Protein: 6.5 g/dL (ref 6.5–8.1)

## 2018-08-23 LAB — URINALYSIS, ROUTINE W REFLEX MICROSCOPIC
BACTERIA UA: NONE SEEN
BILIRUBIN URINE: NEGATIVE
GLUCOSE, UA: NEGATIVE mg/dL
Ketones, ur: NEGATIVE mg/dL
Leukocytes, UA: NEGATIVE
NITRITE: NEGATIVE
Protein, ur: NEGATIVE mg/dL
SPECIFIC GRAVITY, URINE: 1.004 — AB (ref 1.005–1.030)
pH: 8 (ref 5.0–8.0)

## 2018-08-23 LAB — TROPONIN I: TROPONIN I: 0.31 ng/mL — AB (ref <0.03)

## 2018-08-23 LAB — PROTIME-INR
INR: 0.82
Prothrombin Time: 11.2 seconds — ABNORMAL LOW (ref 11.4–15.2)

## 2018-08-23 LAB — LACTIC ACID, PLASMA: Lactic Acid, Venous: 1.8 mmol/L (ref 0.5–1.9)

## 2018-08-23 MED ORDER — OXYCODONE-ACETAMINOPHEN 5-325 MG PO TABS: 1.0000 | ORAL_TABLET | ORAL | 0 refills | Status: DC | PRN

## 2018-08-23 MED ORDER — METHYLPREDNISOLONE SODIUM SUCC 125 MG IJ SOLR
125.0000 mg | Freq: Once | INTRAMUSCULAR | Status: AC
  Administered 2018-08-23: 125 mg via INTRAVENOUS
  Filled 2018-08-23: qty 2

## 2018-08-23 MED ORDER — MORPHINE SULFATE (PF) 4 MG/ML IV SOLN
4.0000 mg | Freq: Once | INTRAVENOUS | Status: AC
  Administered 2018-08-23: 4 mg via INTRAVENOUS
  Filled 2018-08-23: qty 1

## 2018-08-23 MED ORDER — METHOCARBAMOL 500 MG PO TABS: 500.0000 mg | ORAL_TABLET | Freq: Two times a day (BID) | ORAL | 0 refills | Status: DC

## 2018-08-23 NOTE — ED Notes (Signed)
Pt to xray

## 2018-08-23 NOTE — ED Provider Notes (Signed)
[facility] EMERGENCY DEPARTMENT Provider Note   CSN: [id number] Arrival date & time: 08/23/18  7341     History   Chief Complaint Chief Complaint  Patient presents with  . Leg Pain    HPI Cindy [name] is a 62 y.o. female.  No language interpreter was used.  Leg Pain   This is a new problem. Episode onset: 3 days. The problem occurs constantly. The problem has been gradually worsening. The pain is present in the right lower leg and right upper leg. The quality of the pain is described as aching. She has tried nothing for the symptoms. The treatment provided no relief. There has been no history of extremity trauma.  Pt reports pain and swelling to right leg.   Past Medical History:  Diagnosis Date  . 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 Arthritis   . Chronic respiratory failure ([place]) On home 02 2-3L  . Compression fracture of lumbar vertebra ([place])   . COPD (chronic obstructive pulmonary disease) ([place])   . Hyperglycemia, drug-induced   . Hyperlipidemia   . Kidney stones 2014   Left side, multiple  . Osteoporosis 2013  . Shortness of breath   . Thyroid disease    pt denies  . Tobacco abuse     Patient Active Problem List   Diagnosis Date Noted  . Acute on chronic diastolic heart failure ([place])   . S/P PTCA (percutaneous transluminal coronary angioplasty)   . Orthopnea   . Non-ST elevation (NSTEMI) myocardial infarction ([place])   . Essential hypertension   . Acute on chronic respiratory failure ([place]) 08/12/2018  . Bilateral leg edema 08/11/2018  . Hypokalemia 08/11/2018  . Abnormal EKG 08/11/2018  . Diastolic dysfunction 93/79/0240  . COPD with acute exacerbation ([place]) 08/10/2018  . Hypercarbia 08/10/2018  . 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 Leg swelling 04/15/2016  . Rash and nonspecific skin eruption 08/15/2014  . Ureteral stone with hydronephrosis 05/30/2013  . Allergic rhinitis 04/11/2013  . Ecchymosis 04/11/2013  . Kidney stones 04/11/2013  .  Lumbar back pain 03/03/2013  . Compression fracture of L3 lumbar vertebra 03/03/2013  . Constipation 03/03/2013  . COPD exacerbation ([place]) 12/14/2012  . Sinus tachycardia 06/27/2012  . Osteopenia 03/16/2012  . GERD (gastroesophageal reflux disease) 03/16/2012  . Thrush 03/02/2012  . Chronic hypoxemic respiratory failure ([place]) 02/27/2012  . Hyperlipidemia 02/25/2012  . Hyperglycemia, drug-induced 09/25/2011  . Tobacco abuse 09/24/2011  . COPD (chronic obstructive pulmonary disease) ([place]) 12/24/2009    Past Surgical History:  Procedure Laterality Date  . CERVICAL BIOPSY    . COLONOSCOPY N/A 03/31/2013   Procedure: COLONOSCOPY;  Surgeon: Rogene Houston, MD;  Location: [facility];  Service: Endoscopy;  Laterality: N/A;  730  . CORONARY ATHERECTOMY N/A 08/19/2018   Procedure: CORONARY ATHERECTOMY;  Surgeon: Nelva Bush, MD;  Location: [place] CV LAB;  Service: Cardiovascular;  Laterality: N/A;  . CORONARY STENT INTERVENTION N/A 08/19/2018   Procedure: CORONARY STENT INTERVENTION;  Surgeon: Nelva Bush, MD;  Location: [place] CV LAB;  Service: Cardiovascular;  Laterality: N/A;  . INTRAVASCULAR ULTRASOUND/IVUS N/A 08/19/2018   Procedure: Intravascular Ultrasound/IVUS;  Surgeon: Nelva Bush, MD;  Location: [place] CV LAB;  Service: Cardiovascular;  Laterality: N/A;  . LEFT HEART CATH N/A 08/19/2018   Procedure: Left Heart Cath;  Surgeon: Nelva Bush, MD;  Location: [place] CV LAB;  Service: Cardiovascular;  Laterality: N/A;  . LEFT HEART CATH AND CORONARY ANGIOGRAPHY N/A 08/16/2018   Procedure:  LEFT HEART CATH AND CORONARY ANGIOGRAPHY;  Surgeon: Nelva Bush, MD;  Location: [place] CV LAB;  Service: Cardiovascular;  Laterality: N/A;  . TUBAL LIGATION       OB History   None      Home Medications    Prior to Admission medications   Medication Sig Start Date End Date Taking? Authorizing Provider  albuterol (PROVENTIL HFA;VENTOLIN HFA) 108 (90  Base) MCG/ACT inhaler Inhale 2 puffs into the lungs every 6 (six) hours as needed for wheezing or shortness of breath. 05/18/18  Yes [place], Rose Fillers, MD  albuterol (PROVENTIL) (2.5 MG/3ML) 0.083% nebulizer solution INHALE 1 VIAL VIA NEBULIZER EVERY 6 HOURS AS NEEDED. Patient taking differently: Take 2.5 mg by nebulization every 6 (six) hours as needed for wheezing or shortness of breath.  06/16/18  Yes [place], Modena Nunnery, MD  alendronate (FOSAMAX) 70 MG tablet Take 1 tablet (70 mg total) by mouth every Wednesday. TAKE 1 TABLET BY MOUTH EVERY WEEK ON AN EMPTY STOMACH. 08/25/18  Yes [place], Modena Nunnery, MD  aspirin EC 81 MG tablet Take 81 mg by mouth every morning.    Yes [provider]  atorvastatin (LIPITOR) 80 MG tablet Take 1 tablet (80 mg total) by mouth daily at 6 PM. 08/21/18  Yes Domenic Polite, MD  budesonide-formoterol [facility]) 160-4.5 MCG/ACT inhaler Inhale 2 puffs into the lungs 2 (two) times daily. 04/27/18  Yes [place], Modena Nunnery, MD  Calcium Carb-Cholecalciferol 600-800 MG-UNIT TABS Take 1 tablet by mouth 2 (two) times daily.    Yes [provider]  cyclobenzaprine (FLEXERIL) 10 MG tablet TAKE 1 TABLET BY MOUTH 3 TIMES A DAY AS NEEDED FOR MUSCLE SPASMS. 08/20/18  Yes [place], Modena Nunnery, MD  diphenhydrAMINE (BENADRYL) 12.5 MG/5ML liquid Take 6.25 mg by mouth 4 (four) times daily as needed.   Yes [provider]  furosemide (LASIX) 40 MG tablet Take 1 tablet (40 mg total) by mouth daily. Patient taking differently: Take 40-80 mg by mouth See admin instructions. Alternate taking 80mg  one day, then take 40mg  the next day, then repeat. 06/02/18  Yes [place], Modena Nunnery, MD  metoprolol tartrate (LOPRESSOR) 25 MG tablet Take 0.5 tablets (12.5 mg total) by mouth 2 (two) times daily. 08/21/18  Yes Domenic Polite, MD  oxyCODONE-acetaminophen (PERCOCET/ROXICET) 5-325 MG tablet take 1 tablet by mouth every 8 hours if needed for pain Patient taking differently: Take 1 tablet by mouth 2  (two) times daily.  05/21/18  Yes [place], Modena Nunnery, MD  OXYGEN Inhale 3 L into the lungs continuous.    Yes [provider]  pantoprazole (PROTONIX) 40 MG tablet Take 1 tablet (40 mg total) by mouth 2 (two) times daily. 06/09/18  Yes [place], Modena Nunnery, MD  polyethylene glycol [facility] / GLYCOLAX) packet Take 17 g by mouth daily. 06/04/18  Yes Varney Biles, MD  predniSONE (DELTASONE) 10 MG tablet Take 1-4 tablets (10-40 mg total) by mouth daily with breakfast. Take 40mg  daily for 2days then 20mg  daily for 2days then go back to Basal dose of 10mg  daily 08/21/18  Yes Domenic Polite, MD  ticagrelor (BRILINTA) 90 MG TABS tablet Take 1 tablet (90 mg total) by mouth 2 (two) times daily. 08/21/18  Yes Domenic Polite, MD  Tiotropium Bromide Monohydrate (SPIRIVA RESPIMAT) 2.5 MCG/ACT AERS INHALE 2 PUFFS INTO THE LUNGS ONCE DAILY Patient taking differently: Inhale 2 puffs into the lungs at bedtime.  05/28/18  Yes Delsa Grana, PA-C  nicotine (NICODERM CQ - DOSED IN MG/24 HOURS) 21  mg/24hr patch Place 1 patch (21 mg total) onto the skin daily. 08/22/18   Domenic Polite, MD  nystatin (MYCOSTATIN) 100000 UNIT/ML suspension Use as directed 5 mLs (500,000 Units total) in the mouth or throat 4 (four) times daily. 01/29/18   Alycia Rossetti, MD    Family History Family History  Problem Relation Age of Onset  . Heart disease Mother   . Hypertension Sister   . Hypertension Brother     Social History Social History   Tobacco Use  . Smoking status: Current Every Day Smoker    Packs/day: 1.00    Years: 25.00    Pack years: 25.00    Types: Cigarettes  . Smokeless tobacco: Never Used  Substance Use Topics  . Alcohol use: No  . Drug use: No     Allergies   Keflex [cephalexin]; Doxycycline; Sulfa antibiotics; Avelox [moxifloxacin hcl in nacl]; and Toradol [ketorolac tromethamine]   Review of Systems Review of Systems  Musculoskeletal: Positive for arthralgias.  All other systems reviewed and  are negative.    Physical Exam Updated Vital Signs BP (!) 174/104   Pulse (!) 111   Temp 98.3 F (36.8 C) (Oral)   Resp 15   Ht 5' 1.5" (1.562 m)   Wt 83.9 kg   SpO2 100%   BMI 34.38 kg/m   Physical Exam  Constitutional: She is oriented to person, place, and time. She appears well-developed and well-nourished.  HENT:  Head: Normocephalic.  Right Ear: External ear normal.  Eyes: Pupils are equal, round, and reactive to light. EOM are normal.  Neck: Normal range of motion.  Cardiovascular: Normal rate and regular rhythm.  Pulmonary/Chest: Effort normal.  Abdominal: Soft. She exhibits no distension.  Musculoskeletal: Normal range of motion. She exhibits edema.  Swollen diffusely tender right leg  Neurological: She is alert and oriented to person, place, and time.  Skin: Skin is warm and dry.  Psychiatric: She has a normal mood and affect.  Nursing note and vitals reviewed.    ED Treatments / Results  Labs (all labs ordered are listed, but only abnormal results are displayed) Labs Reviewed  CBC WITH DIFFERENTIAL/PLATELET - Abnormal; Notable for the following components:      Result Value   WBC 27.3 (*)    Neutro Abs 20.7 (*)    Lymphs Abs 4.1 (*)    Monocytes Absolute 2.5 (*)    All other components within normal limits  COMPREHENSIVE METABOLIC PANEL - Abnormal; Notable for the following components:   Chloride 90 (*)    CO2 34 (*)    Glucose, Bld 137 (*)    Calcium 10.5 (*)    ALT 66 (*)    Total Bilirubin 1.5 (*)    All other components within normal limits  PROTIME-INR - Abnormal; Notable for the following components:   Prothrombin Time 11.2 (*)    All other components within normal limits  URINALYSIS, ROUTINE W REFLEX MICROSCOPIC - Abnormal; Notable for the following components:   Color, Urine COLORLESS (*)    Specific Gravity, Urine 1.004 (*)    Hgb urine dipstick SMALL (*)    All other components within normal limits  LACTIC ACID, PLASMA  TROPONIN I     EKG None  Radiology Dg Chest 2 View  Result Date: 08/23/2018 CLINICAL DATA:  Shortness of breath, cough. EXAM: CHEST - 2 VIEW COMPARISON:  Radiographs of August 18, 2018. FINDINGS: The heart size and mediastinal contours are within normal limits. Both lungs  are clear. No pneumothorax or pleural effusion is noted. The visualized skeletal structures are unremarkable. IMPRESSION: No active cardiopulmonary disease. Electronically Signed   By: Marijo Conception, M.D.   On: 08/23/2018 08:53   US Venous Img Lower Unilateral Right  Result Date: 08/23/2018 CLINICAL DATA:  Right lower extremity pain and edema since this past Friday. Evaluate for DVT. EXAM: RIGHT LOWER EXTREMITY VENOUS DOPPLER ULTRASOUND TECHNIQUE: Gray-scale sonography with graded compression, as well as color Doppler and duplex ultrasound were performed to evaluate the lower extremity deep venous systems from the level of the common femoral vein and including the common femoral, femoral, profunda femoral, popliteal and calf veins including the posterior tibial, peroneal and gastrocnemius veins when visible. The superficial great saphenous vein was also interrogated. Spectral Doppler was utilized to evaluate flow at rest and with distal augmentation maneuvers in the common femoral, femoral and popliteal veins. COMPARISON:  None. FINDINGS: Contralateral Common Femoral Vein: Respiratory phasicity is normal and symmetric with the symptomatic side. No evidence of thrombus. Normal compressibility. Common Femoral Vein: No evidence of thrombus. Normal compressibility, respiratory phasicity and response to augmentation. Saphenofemoral Junction: No evidence of thrombus. Normal compressibility and flow on color Doppler imaging. Profunda Femoral Vein: No evidence of thrombus. Normal compressibility and flow on color Doppler imaging. Femoral Vein: No evidence of thrombus. Normal compressibility, respiratory phasicity and response to augmentation. Popliteal  Vein: No evidence of thrombus. Normal compressibility, respiratory phasicity and response to augmentation. Calf Veins: No evidence of thrombus. Normal compressibility and flow on color Doppler imaging. Superficial Great Saphenous Vein: No evidence of thrombus. Normal compressibility. Venous Reflux:  None. Other Findings:  None. IMPRESSION: No evidence of DVT within the right lower extremity. Electronically Signed   By: Sandi Mariscal M.D.   On: 08/23/2018 09:25    Procedures Procedures (including critical care time)  Medications Ordered in ED Medications - No data to display   Initial Impression / Assessment and Plan / ED Course  I have reviewed the triage vital signs and the nursing notes.  Pertinent labs & imaging results that were available during my care of the patient were reviewed by me and considered in my medical decision making (see chart for details).  Clinical Course as of Aug 23 1101  Mon Aug 23, 2018  1101 CO2(!): 34 [LS]  1101 CO2(!): 34 [LS]  1102 Comprehensive metabolic panel(!) [LS]  8338 Comprehensive metabolic panel(!) [LS]    Clinical Course User Index [LS] Fransico Meadow, PA-C    MDM  Ekg unchanged, troponin trended down since being in the hospital,  Pt has elevation of wbc's but lactate is normal.  Doppler of right leg is normal.  LS spine shows l3 compression that is most likely cause of pain in her leg.    Dr. Lita Mains in to see and examine pt.   Pt given solumedrol 125 IV.  Pt given rx for robaxin and percocet.  Pt has been advised to follow up with her MD this week as scheduled for recheck.  Final Clinical Impressions(s) / ED Diagnoses   Final diagnoses:  Right leg pain  Radiculopathy, unspecified spinal region   Meds ordered this encounter  Medications  . morphine 4 MG/ML injection 4 mg  . methylPREDNISolone sodium succinate (SOLU-MEDROL) 125 mg/2 mL injection 125 mg  . methocarbamol (ROBAXIN) 500 MG tablet    Sig: Take 1 tablet (500 mg total) by  mouth 2 (two) times daily.    Dispense:  20 tablet  Refill:  0    Order Specific Question:   Supervising Provider    Answer:   Sabra Heck, BRIAN [[id number]]  . oxyCODONE-acetaminophen (PERCOCET/ROXICET) 5-325 MG tablet    Sig: Take 1 tablet by mouth every 4 (four) hours as needed for severe pain.    Dispense:  15 tablet    Refill:  0    Order Specific Question:   Supervising Provider    Answer:   MILLER, BRIAN [[id number]]  . morphine 4 MG/ML injection 4 mg    ED Discharge Orders         Ordered    methocarbamol (ROBAXIN) 500 MG tablet  2 times daily     08/23/18 1317    oxyCODONE-acetaminophen (PERCOCET/ROXICET) 5-325 MG tablet  Every 4 hours PRN     08/23/18 1331        An After Visit Summary was printed and given to the patient.    Fransico Meadow, [place] 08/23/18 1409    Julianne Rice, MD 08/23/18 1452

## 2018-08-23 NOTE — Consult Note (Signed)
   [facility] CM Inpatient Consult   08/23/2018  AIKAM HELLICKSON [birth year] [id number]   Follow up on referral:  Call attempt to patient regarding hospital referral for [facility] [employer] services.  Left a HIPAA compliant voicemail to request a return call and contact information was given.  Natividad Brood, RN BSN [facility] Liaison  [phone] business mobile phone Toll free office [phone]

## 2018-08-23 NOTE — ED Notes (Signed)
Pt reports wears 3 liters home o2 "all the time".

## 2018-08-23 NOTE — ED Notes (Signed)
Pt has wound on bottom, dressing applied

## 2018-08-23 NOTE — ED Notes (Signed)
Date and time results received: 08/23/18 1109 (use smartphrase ".now" to insert current time)  Test: Troponin Critical Value: 0.31 Name of Provider Notified: [place], PA  Orders Received? Or Actions Taken?: NA

## 2018-08-23 NOTE — ED Triage Notes (Addendum)
Pt reports had open heart surgery last Friday. Pt reports discharged on 08/20/18. Pt reports leg pain ever since being home. Pt reports leg pain more intense with walking. Upper extremity contusions noted at time of arrival. Pt denies any known injury but reports "bruises are from blood pressures." sacral redness with small blisters noted at time of pt undressing.  Pt reports is on "blood thinners" x1 week.

## 2018-08-23 NOTE — ED Notes (Signed)
Have notified [employer] that she will not make it today per family

## 2018-08-23 NOTE — Discharge Instructions (Addendum)
Continue current pain medication  See your Physicain for recheck.  Your leg pain is most likely coming from a compressed vertebrae in your back.

## 2018-08-25 ENCOUNTER — Encounter: Payer: Self-pay | Admitting: Family Medicine

## 2018-08-25 ENCOUNTER — Ambulatory Visit (INDEPENDENT_AMBULATORY_CARE_PROVIDER_SITE_OTHER): Payer: Medicare Other | Admitting: Family Medicine

## 2018-08-25 ENCOUNTER — Other Ambulatory Visit: Payer: Self-pay

## 2018-08-25 VITALS — BP 148/82 | HR 82 | Temp 98.3°F | Resp 20 | Ht 62.0 in | Wt 194.0 lb

## 2018-08-25 DIAGNOSIS — I10 Essential (primary) hypertension: Secondary | ICD-10-CM | POA: Diagnosis not present

## 2018-08-25 DIAGNOSIS — I214 Non-ST elevation (NSTEMI) myocardial infarction: Secondary | ICD-10-CM | POA: Diagnosis not present

## 2018-08-25 DIAGNOSIS — M5441 Lumbago with sciatica, right side: Secondary | ICD-10-CM | POA: Diagnosis not present

## 2018-08-25 DIAGNOSIS — I5033 Acute on chronic diastolic (congestive) heart failure: Secondary | ICD-10-CM | POA: Diagnosis not present

## 2018-08-25 DIAGNOSIS — G8929 Other chronic pain: Secondary | ICD-10-CM

## 2018-08-25 DIAGNOSIS — Z9861 Coronary angioplasty status: Secondary | ICD-10-CM

## 2018-08-25 DIAGNOSIS — Z720 Tobacco use: Secondary | ICD-10-CM | POA: Diagnosis not present

## 2018-08-25 DIAGNOSIS — J9611 Chronic respiratory failure with hypoxia: Secondary | ICD-10-CM | POA: Diagnosis not present

## 2018-08-25 DIAGNOSIS — Z09 Encounter for follow-up examination after completed treatment for conditions other than malignant neoplasm: Secondary | ICD-10-CM | POA: Diagnosis not present

## 2018-08-25 DIAGNOSIS — S22000D Wedge compression fracture of unspecified thoracic vertebra, subsequent encounter for fracture with routine healing: Secondary | ICD-10-CM | POA: Diagnosis not present

## 2018-08-25 MED ORDER — METHYLPREDNISOLONE ACETATE 80 MG/ML IJ SUSP
80.0000 mg | Freq: Once | INTRAMUSCULAR | Status: AC
  Administered 2018-08-25: 80 mg via INTRAMUSCULAR

## 2018-08-25 NOTE — Progress Notes (Addendum)
Patient ID: Cindy [name], female    DOB: 26-Jan-1956, 62 y.o.   MRN: [id number]  PCP: Alycia Rossetti, MD  Chief Complaint  Patient presents with  . Hospital F/U    COPD exacerbation/ Leg pain D/T L3 compression fx  . Medication Management    would like anti-inflammatory     Subjective:   Cindy [name] is a 62 y.o. female, presents to clinic for hospital f/up after admission from 08/10/18 to 08/21/18 initially for SOB and suspected AECOPD, but on 08/12/18 had CP and diaphoresis with exertion and found to have NSTEMI with diagnostic cardiac cath on 08/16/2018 showing significant two-vessel coronary artery disease, s/p complicated PCI on 6/71/24 with arthrectomy and DES to LAD.  ECHO done on 08/20/18 showed improved LVEF 55-60%.  She presents to office for hospital follow up.  Her main complaint is low back pain which is not new, and she is requesting an anti-inflammatory medication for this.  She was scheduled to follow-up outpatient with pulmonology 2 days ago however she was "in the hospital" with a ER visit for her back pain.  She is also scheduled to follow-up with cardiology and has home health arranged for PT OT and cardiac rehab.  She also states that she needs nitro and "they forgot to give it to her."  She continues to have baseline shortness of breath at rest and with ambulation, she continues to be on 3 L of oxygen via nasal cannula.  She also complains about swelling to her right arm and significant bruising and ecchymosis bilaterally although she has no pain.    Missed pulmonology appt Due for cardiology follow up Complaining of severe back pain - L3. Wants antiinflammatory medicine and wants to take muscle relaxers with narcotic pain medicine.  She states she has been "taking a tiny piece of percocet" pain is severe, constant, radiates down right leg.  Right arm swollen, right leg swollen Home health refused by pt, but she wants home PT or home cardiac rehab Has not f/up with  pulm she was "in the hospital"  Pt tangential and distracted, very difficult historian. She understands that she is on "blood thinners now", ASA and ticagrelor.  She confirms she was able to get all her medications but believes she needs NG for CP.  She is most concerned with back pain.  She missed pulmonology appt and is knows she is supposed to f/up with cardiology.  She continues to smoke.  She states blood sugars "are good and bad" and cannot explain what that means.  She states she does not want anyone in her home and a few seconds later states she needs someone to come to her home to do PT since she does not or cannot get to the hospital to do PT or cardiac rehab.  Patient medications that were changed during her hospital admission- statin was increased and changed to Lipitor 80 mg, metoprolol tartrate 12.5 mg BID, baby ASA, brilinta - DAPT for 12 months.  For her COPD she was to continue ICS/LABA and SABA with steroid taper and home O2.   HTN to be managed with meds above and DASH diet.  She had steroid taper for COPD at discharge - per med review (no discharge summary to review) and she was to do prednisone 40 mg x 2 d, 20 mg x 2 d, then 10 mg daily.  She states only taking 10 mg daily and asks for solumedrol.  She also had solumedrol in  the ER 2 days ago.    Patient Active Problem List   Diagnosis Date Noted  . Acute on chronic diastolic heart failure ([place])   . S/P PTCA (percutaneous transluminal coronary angioplasty)   . Orthopnea   . Non-ST elevation (NSTEMI) myocardial infarction ([place])   . Essential hypertension   . Acute on chronic respiratory failure ([place]) 08/12/2018  . Bilateral leg edema 08/11/2018  . Hypokalemia 08/11/2018  . Abnormal EKG 08/11/2018  . Diastolic dysfunction 87/68/1157  . COPD with acute exacerbation ([place]) 08/10/2018  . Hypercarbia 08/10/2018  . 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 Leg swelling 04/15/2016  . Rash and nonspecific skin eruption  08/15/2014  . Ureteral stone with hydronephrosis 05/30/2013  . Allergic rhinitis 04/11/2013  . Ecchymosis 04/11/2013  . Kidney stones 04/11/2013  . Lumbar back pain 03/03/2013  . Compression fracture of L3 lumbar vertebra 03/03/2013  . Constipation 03/03/2013  . COPD exacerbation ([place]) 12/14/2012  . Sinus tachycardia 06/27/2012  . Osteopenia 03/16/2012  . GERD (gastroesophageal reflux disease) 03/16/2012  . Thrush 03/02/2012  . Chronic hypoxemic respiratory failure ([place]) 02/27/2012  . Hyperlipidemia 02/25/2012  . Hyperglycemia, drug-induced 09/25/2011  . Tobacco abuse 09/24/2011  . COPD (chronic obstructive pulmonary disease) ([place]) 12/24/2009     Prior to Admission medications   Medication Sig Start Date End Date Taking? Authorizing Provider  albuterol (PROVENTIL HFA;VENTOLIN HFA) 108 (90 Base) MCG/ACT inhaler Inhale 2 puffs into the lungs every 6 (six) hours as needed for wheezing or shortness of breath. 05/18/18  Yes [place], Rose Fillers, MD  albuterol (PROVENTIL) (2.5 MG/3ML) 0.083% nebulizer solution INHALE 1 VIAL VIA NEBULIZER EVERY 6 HOURS AS NEEDED. Patient taking differently: Take 2.5 mg by nebulization every 6 (six) hours as needed for wheezing or shortness of breath.  06/16/18  Yes [place], Modena Nunnery, MD  alendronate (FOSAMAX) 70 MG tablet Take 1 tablet (70 mg total) by mouth every Wednesday. TAKE 1 TABLET BY MOUTH EVERY WEEK ON AN EMPTY STOMACH. 08/25/18  Yes [place], Modena Nunnery, MD  aspirin EC 81 MG tablet Take 81 mg by mouth every morning.    Yes [provider]  atorvastatin (LIPITOR) 80 MG tablet Take 1 tablet (80 mg total) by mouth daily at 6 PM. 08/21/18  Yes Domenic Polite, MD  budesonide-formoterol [facility]) 160-4.5 MCG/ACT inhaler Inhale 2 puffs into the lungs 2 (two) times daily. 04/27/18  Yes [place], Modena Nunnery, MD  Calcium Carb-Cholecalciferol 600-800 MG-UNIT TABS Take 1 tablet by mouth 2 (two) times daily.    Yes [provider]  cyclobenzaprine  (FLEXERIL) 10 MG tablet TAKE 1 TABLET BY MOUTH 3 TIMES A DAY AS NEEDED FOR MUSCLE SPASMS. 08/20/18  Yes [place], Modena Nunnery, MD  diphenhydrAMINE (BENADRYL) 12.5 MG/5ML liquid Take 6.25 mg by mouth 4 (four) times daily as needed.   Yes [provider]  furosemide (LASIX) 40 MG tablet Take 1 tablet (40 mg total) by mouth daily. Patient taking differently: Take 40-80 mg by mouth See admin instructions. Alternate taking 80mg  one day, then take 40mg  the next day, then repeat. 06/02/18  Yes , Modena Nunnery, MD  methocarbamol (ROBAXIN) 500 MG tablet Take 1 tablet (500 mg total) by mouth 2 (two) times daily. 08/23/18  Yes Caryl Ada K, PA-C  metoprolol tartrate (LOPRESSOR) 25 MG tablet Take 0.5 tablets (12.5 mg total) by mouth 2 (two) times daily. 08/21/18  Yes Domenic Polite, MD  nystatin (MYCOSTATIN) 100000 UNIT/ML suspension Use as directed 5 mLs (500,000 Units total) in the mouth or  throat 4 (four) times daily. 01/29/18  Yes [place], Modena Nunnery, MD  oxyCODONE-acetaminophen (PERCOCET/ROXICET) 5-325 MG tablet Take 1 tablet by mouth every 4 (four) hours as needed for severe pain. 08/23/18  Yes Caryl Ada K, PA-C  OXYGEN Inhale 3 L into the lungs continuous.    Yes [provider]  pantoprazole (PROTONIX) 40 MG tablet Take 1 tablet (40 mg total) by mouth 2 (two) times daily. 06/09/18  Yes , Modena Nunnery, MD  polyethylene glycol [facility] / GLYCOLAX) packet Take 17 g by mouth daily. 06/04/18  Yes Varney Biles, MD  predniSONE (DELTASONE) 10 MG tablet Take 1-4 tablets (10-40 mg total) by mouth daily with breakfast. Take 40mg  daily for 2days then 20mg  daily for 2days then go back to Basal dose of 10mg  daily Patient taking differently: Take 10 mg by mouth daily with breakfast. Basal dose of 10mg  daily 08/21/18  Yes Domenic Polite, MD  ticagrelor (BRILINTA) 90 MG TABS tablet Take 1 tablet (90 mg total) by mouth 2 (two) times daily. 08/21/18  Yes Domenic Polite, MD  Tiotropium Bromide Monohydrate  (SPIRIVA RESPIMAT) 2.5 MCG/ACT AERS INHALE 2 PUFFS INTO THE LUNGS ONCE DAILY Patient taking differently: Inhale 2 puffs into the lungs at bedtime.  05/28/18  Yes Delsa Grana, PA-C     Allergies  Allergen Reactions  . Keflex [Cephalexin] Anaphylaxis and Rash  . Doxycycline Other (See Comments)    Gave patient oral thrush  . Sulfa Antibiotics   . Avelox [Moxifloxacin Hcl In Nacl] Itching and Rash  . Toradol [Ketorolac Tromethamine] Swelling, Rash and Other (See Comments)    Bruising, swelling, rash at injection site     Family History  Problem Relation Age of Onset  . Heart disease Mother   . Hypertension Sister   . Hypertension Brother      Social History   Socioeconomic History  . Marital status: Divorced    Spouse name: Not on file  . Number of children: 3  . Years of education: Not on file  . Highest education level: Not on file  Occupational History  . Occupation: disabled    Employer: DISABLED  Social Needs  . Financial resource strain: Not on file  . Food insecurity:    Worry: Not on file    Inability: Not on file  . Transportation needs:    Medical: Not on file    Non-medical: Not on file  Tobacco Use  . Smoking status: Current Every Day Smoker    Packs/day: 1.00    Years: 25.00    Pack years: 25.00    Types: Cigarettes  . Smokeless tobacco: Never Used  Substance and Sexual Activity  . Alcohol use: No  . Drug use: No  . Sexual activity: Not Currently    Birth control/protection: Surgical  Lifestyle  . Physical activity:    Days per week: Not on file    Minutes per session: Not on file  . Stress: Not on file  Relationships  . Social connections:    Talks on phone: Not on file    Gets together: Not on file    Attends religious service: Not on file    Active member of club or organization: Not on file    Attends meetings of clubs or organizations: Not on file    Relationship status: Not on file  . Intimate partner violence:    Fear of current  or ex partner: Not on file    Emotionally abused: Not on file  Physically abused: Not on file    Forced sexual activity: Not on file  Other Topics Concern  . Not on file  Social History Narrative  . Not on file     Review of Systems  Constitutional: Negative.  Negative for activity change, appetite change, chills, diaphoresis, fatigue, fever and unexpected weight change.  HENT: Negative.   Eyes: Negative.   Cardiovascular: Negative for palpitations.  Gastrointestinal: Negative.   Endocrine: Negative.   Genitourinary: Negative.   Musculoskeletal: Positive for back pain. Negative for joint swelling.  Skin: Negative for rash and wound.  Allergic/Immunologic: Negative.   Neurological: Negative.   Hematological: Negative.   Psychiatric/Behavioral: Negative.   All other systems reviewed and are negative.      Objective:     Vitals:   08/25/18 0925  BP: (!) 148/82  Pulse: 82  Resp: 20  Temp: 98.3 F (36.8 C)  TempSrc: Oral  SpO2: 94%  Weight: 194 lb (88 kg)  Height: 5\' 2"  (1.575 m)      Physical Exam  Constitutional: She appears well-developed.  Non-toxic appearance. She does not have a sickly appearance. She does not appear ill. No distress.  Chronically ill, obese, anxious appearing female, older than stated age, NAD on 3L O2 via [place]  HENT:  Head: Normocephalic and atraumatic.  Right Ear: External ear normal.  Left Ear: External ear normal.  Nose: Nose normal.  Mouth/Throat: Oropharynx is clear and moist.  Eyes: Pupils are equal, round, and reactive to light. Conjunctivae and lids are normal. Right eye exhibits no discharge. Left eye exhibits no discharge. No scleral icterus.  Neck: Trachea normal, normal range of motion and phonation normal. Neck supple. No tracheal deviation and normal range of motion present.  Cardiovascular: Normal rate, regular rhythm and normal pulses. Exam reveals no gallop and no friction rub.  No murmur heard. Pulmonary/Chest: No  accessory muscle usage or stridor. No tachypnea. No respiratory distress. She has no wheezes. She has no rhonchi. She has no rales. She exhibits no tenderness.  Diminished BS b/l with scattered rhonchi Able to speak in full and complete sentences  Abdominal: Soft. Normal appearance and bowel sounds are normal.  Musculoskeletal: Normal range of motion.       Lumbar back: She exhibits tenderness. She exhibits normal range of motion, no bony tenderness and no spasm.  Neurological: She is alert. She displays no tremor. She exhibits normal muscle tone. Coordination and gait normal.  Skin: Skin is warm and intact. Capillary refill takes less than 2 seconds. Bruising and ecchymosis noted. No rash noted. She is not diaphoretic. No cyanosis or erythema. No pallor. Nails show no clubbing.  Right arm with diffuse severe ecchymosis and mild edema Left arm with bruising and mild ecchymosis to forearm, no edema  Psychiatric: Her mood appears anxious. Her speech is rapid and/or pressured and tangential. She is hyperactive. She is inattentive.  Nursing note and vitals reviewed.         Assessment & Plan:      ICD-10-CM   1. Non-ST elevation (NSTEMI) myocardial infarction (HCC) I21.4    continue DAPT, tolerating statin lipitor 80 mg, metroprolol 12.5 BID, cardiology f/up in 2 d  2. S/P PTCA (percutaneous transluminal coronary angioplasty) Z98.61    hematoma to right arm s/p procedure with ecchymosis and mild edema, no pain - DAPT  3. Acute on chronic diastolic heart failure (HCC) I50.33    appears euvolemic, no increased WOB at rest, con't lasix 40 qd  4.  Essential hypertension I10   5. Closed compression fracture of thoracic vertebra with routine healing, subsequent encounter S22.000D Ambulatory referral to Spine Surgery    methylPREDNISolone acetate (DEPO-MEDROL) injection 80 mg   old L3 compression and chronic back pain, con't flexeril, topical meds, use heating pad, tylenol as directed, no NSAIDS,  needs pain management or physiatry  6. Chronic midline low back pain with right-sided sciatica M54.41 Ambulatory referral to Spine Surgery   G89.29 methylPREDNISolone acetate (DEPO-MEDROL) injection 80 mg   likely chronic, referred to spinal specialist or physiatry for eval/management.  Chronically on steroids, need to avoid NSAIDs, out of options for med mgmt  7. Encounter for examination following treatment at hospital Z09   8. Tobacco abuse Z72.0    encouraged her to stop smoking    Hospital f/up for new dx of CAD/NSTEMI s/p PTCA with DES to LAD, multivessel disease  She has not been to pulmonology f/up, missed appt but is going to reschedule, breathing at baseline. No HH, PT or cardiac rehab yet, Referral coordinator, Ofilia Neas, has followed up and home health with [facility] has been ordered, there is no notation that we can see of any refusal from the pt.    Pt has been avoiding any exertional activity, is requesting subligual NG for CP, but denies having any CP. Cardiology f/up in 2 days.  There was no discharge summary to review, however I have reviewed all the cardiology notes and read through most notes during her admission and with the final cardiology progress note there was no chest pain noted with exertion, and no prescription for sublingual nitroglycerin.  Message was sent to cardiologist that she is to follow-up with outpatient.  Pt's main preoccupation is back pain and wanting pain meds or antiinflammatory meds.  She was seen in the ER 2 days ago for this, was given more muscle relaxers and narcotic pain meds.  Last Rx for narcotic pain medicine was in June 2019.  No further narcs prescribed today.  She has hx of known L3 compression from old fx. Previously refused surgical eval.   Chronic back pain - recent steroid burst with taper down to daily 10 mg dose, do not think any high doses will improve the chronic back pain and will likely only have adverse SE, the pt has  muscle relaxers, she cannot use NSAIDs due to new DAPT.  Pt instructed to use tylenol as directed, for severe pain can use her home pain meds, topical OTC tx ok, heating pad, and will need to see specialist. Likely only options is pain management and/or physiatry or spinal specialist, referral entered.  Pt has been taking all new meds without any SE.  BP here is mildly elevated, pt is very anxious and complaining of severe pain, she was encouraged to monitor BP and return for recheck if > 150/90 on average.  She is to reschedule pulmonology f/up -she verbalizes understanding.  And she is going to follow-up in 2 days cardiology.  Delsa Grana, PA-C 08/25/18 10:04 AM

## 2018-08-25 NOTE — Patient Instructions (Addendum)
For your pain rely on your narcotic pain medicine, tylenol, heating pads, topical pain creams.  Do not use NSAIDS  Following up with home health, needs cardiac rehab

## 2018-08-26 ENCOUNTER — Other Ambulatory Visit: Payer: Self-pay | Admitting: Family Medicine

## 2018-08-26 ENCOUNTER — Encounter: Payer: Self-pay | Admitting: Physician Assistant

## 2018-08-26 ENCOUNTER — Other Ambulatory Visit: Payer: Self-pay

## 2018-08-26 NOTE — Consult Note (Signed)
Patient returned call.  Voicemail received from patient for follow up patient states she was going to MD appointment.  Will have community care manager for follow up.  She has transitioned home.  Natividad Brood, RN BSN [facility] Liaison  [phone] business mobile phone Toll free office [phone]

## 2018-08-26 NOTE — Telephone Encounter (Signed)
Ok to refill 

## 2018-08-26 NOTE — Progress Notes (Addendum)
Cardiology Office Note    Date:  08/27/2018  ID:  PRACHI OFTEDAHL, DOB 1956-10-02, MRN [id number] PCP:  Alycia Rossetti, MD  Cardiologist:  Kate Sable, MD  Chief Complaint: f/u NSTEMI  History of Present Illness:  Cindy [name] is a 62 y.o. female with history of severe COPD with chronic respiratory failure on home O2, HTN, HLD, T11 compression fracture, chronic pain, tobacco abuse, recently diagnosed CAD/NSTEMI and diastolic CHF who presents for post-hospital follow-up. She had no prior cardiac hx before recent admission when she was admitted for AECOPD. She developed CP/diaphoresis during her admission and ruled in for NSTEMI. Initial echo 8/14 showed mild LVH, EF 55% with + WMA mid-apical anteroseptal myocardium, moderate aortic sclerosis to mild stenosis. She underwent cath 8/19 and was found to have significant 2V disease with 90% ostial LAD and 60-70% OM2, LVEF 45-50%, LVEDP 25-30. She was unable to lie flat so it was recommended to diurese her and bring her back for planned PCI. Given poor function status and oxygen-dependent COPD, she was not felt to be a good surgical candidate. She returned to the cath lab 08/19/18 and had complex but ultimately successful PCI to ostial and proximal LAD utilizing orbital atherectomy and IVUS with successful placement of overlapping Synergy 4.0 x 12 mm (proximal) and 3.0 x 20 mm (distal) drug eluting stents. Procedure was complicated by abrupt closure of the proximal LAD during atherectomy, which resolved following stent placement (she was symptomatic with this). Limited echo 08/20/18 showed EF 55-60% with improvement in wall motion. She was discharged on ASA, Brilinta, atorvastatin, Lasix, and metoprolol among pulm meds as well. Notes indicate extensive UE ecchymosis which the patient relays happened even after simple blood pressure checks. She feels this is very slowly improving, but is very prominent on exam. She was seen back in the ER 8/26 with right  leg pain, no evidence of DVT, felt possibly radicular pain. Elevated WBC was felt possibly 2/2 steroid use. Last labs 08/23/18 showed K 3.7 (range 3.2-4.4 recently), BUN 18, Cr 0.80, CO2 34, AST 38, ALT 66, TBili 1.5, WBC 27.3 (recent range 13-26.8). Last LDL in 03/2017 was 114.    She quit smoking just a few days ago. Overall she is feeling better day by day since DC. SOB is at baseline again. No chest pain.  Past Medical History:  Diagnosis Date  . 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 Arthritis   . CAD (coronary artery disease)    a. NSTEMI 07/2018 s/p difficult PCI with overlapping DES to LAD, residual OM2 disease treated medically.  . Chronic diastolic CHF (congestive heart failure) ([place])   . Chronic pain   . Chronic respiratory failure ([place]) On home 02 2-3L  . Compression fracture of lumbar vertebra ([place])   . COPD (chronic obstructive pulmonary disease) ([place])   . Essential hypertension   . Hyperglycemia, drug-induced   . Hyperlipidemia   . Kidney stones 2014   Left side, multiple  . Leukocytosis   . Osteoporosis 2013  . Thyroid disease    pt denies  . Tobacco abuse     Past Surgical History:  Procedure Laterality Date  . CERVICAL BIOPSY    . COLONOSCOPY N/A 03/31/2013   Procedure: COLONOSCOPY;  Surgeon: Rogene Houston, MD;  [facility];  Service: Endoscopy;  Laterality: N/A;  730  . CORONARY ATHERECTOMY N/A 08/19/2018   Procedure: CORONARY ATHERECTOMY;  Surgeon: Nelva Bush, MD;  Location: [facility];  Service: Cardiovascular;  Laterality: N/A;  . CORONARY STENT INTERVENTION N/A 08/19/2018   Procedure: CORONARY STENT INTERVENTION;  Surgeon: Nelva Bush, MD;  Location: [facility];  Service: Cardiovascular;  Laterality: N/A;  . INTRAVASCULAR ULTRASOUND/IVUS N/A 08/19/2018   Procedure: Intravascular Ultrasound/IVUS;  Surgeon: Nelva Bush, MD;  Location: [place] [facility];  Service: Cardiovascular;  Laterality: N/A;  . LEFT HEART CATH N/A  08/19/2018   Procedure: Left Heart Cath;  Surgeon: Nelva Bush, MD;  Location: [place];  Service: Cardiovascular;  Laterality: N/A;  . LEFT HEART CATH AND CORONARY ANGIOGRAPHY N/A 08/16/2018   Procedure: LEFT HEART CATH AND CORONARY ANGIOGRAPHY;  Surgeon: Nelva Bush, MD;  Location: [facility];  Service: Cardiovascular;  Laterality: N/A;  . TUBAL LIGATION      Current Medications: Current Meds  Medication Sig  . albuterol (PROVENTIL HFA;VENTOLIN HFA) 108 (90 Base) MCG/ACT inhaler Inhale 2 puffs into the lungs every 6 (six) hours as needed for wheezing or shortness of breath.  Marland Kitchen albuterol (PROVENTIL) (2.5 MG/3ML) 0.083% nebulizer solution INHALE 1 VIAL VIA NEBULIZER EVERY 6 HOURS AS NEEDED. (Patient taking differently: Take 2.5 mg by nebulization every 6 (six) hours as needed for wheezing or shortness of breath. )  . alendronate (FOSAMAX) 70 MG tablet Take 1 tablet (70 mg total) by mouth every Wednesday. TAKE 1 TABLET BY MOUTH EVERY WEEK ON AN EMPTY STOMACH.  Marland Kitchen aspirin EC 81 MG tablet Take 81 mg by mouth every morning.   Marland Kitchen atorvastatin (LIPITOR) 80 MG tablet Take 1 tablet (80 mg total) by mouth daily at 6 PM.  . budesonide-formoterol (SYMBICORT) 160-4.5 MCG/ACT inhaler Inhale 2 puffs into the lungs 2 (two) times daily.  . Calcium Carb-Cholecalciferol 600-800 MG-UNIT TABS Take 1 tablet by mouth 2 (two) times daily.   . cyclobenzaprine (FLEXERIL) 10 MG tablet TAKE 1 TABLET BY MOUTH 3 TIMES A DAY AS NEEDED FOR MUSCLE SPASMS.  . cyclobenzaprine (FLEXERIL) 10 MG tablet TAKE 1 TABLET BY MOUTH 3 TIMES A DAY AS NEEDED FOR MUSCLE SPASMS.  Marland Kitchen diphenhydrAMINE (BENADRYL) 12.5 MG/5ML liquid Take 6.25 mg by mouth 4 (four) times daily as needed.  . furosemide (LASIX) 40 MG tablet Take 1 tablet (40 mg total) by mouth daily. (Patient taking differently: Take 40-80 mg by mouth See admin instructions. Alternate taking 80mg  one day, then take 40mg  the next day, then repeat.)  . metoprolol  tartrate (LOPRESSOR) 25 MG tablet Take 0.5 tablets (12.5 mg total) by mouth 2 (two) times daily.  . nitroGLYCERIN (NITROSTAT) 0.4 MG SL tablet Place 1 tablet (0.4 mg total) under the tongue every 5 (five) minutes as needed for chest pain. If no improvement with 3 doses call 911  . nystatin (MYCOSTATIN) 100000 UNIT/ML suspension Use as directed 5 mLs (500,000 Units total) in the mouth or throat 4 (four) times daily.  Marland Kitchen oxyCODONE-acetaminophen (PERCOCET/ROXICET) 5-325 MG tablet Take 1 tablet by mouth every 4 (four) hours as needed for severe pain.  . OXYGEN Inhale 3 L into the lungs continuous.   . pantoprazole (PROTONIX) 40 MG tablet Take 1 tablet (40 mg total) by mouth 2 (two) times daily.  . polyethylene glycol (MIRALAX / GLYCOLAX) packet Take 17 g by mouth daily.  . predniSONE (DELTASONE) 10 MG tablet Take 1-4 tablets (10-40 mg total) by mouth daily with breakfast. Take 40mg  daily for 2days then 20mg  daily for 2days then go back to Basal dose of 10mg  daily (Patient taking differently: Take 10 mg by mouth  daily with breakfast. Basal dose of 10mg  daily)  . ticagrelor (BRILINTA) 90 MG TABS tablet Take 1 tablet (90 mg total) by mouth 2 (two) times daily.  . Tiotropium Bromide Monohydrate (SPIRIVA RESPIMAT) 2.5 MCG/ACT AERS INHALE 2 PUFFS INTO THE LUNGS ONCE DAILY (Patient taking differently: Inhale 2 puffs into the lungs at bedtime. )     Allergies:   Keflex [cephalexin]; Doxycycline; Sulfa antibiotics; Avelox [moxifloxacin hcl in nacl]; and Toradol [ketorolac tromethamine]   Social History   Socioeconomic History  . Marital status: Divorced    Spouse name: Not on file  . Number of children: 3  . Years of education: Not on file  . Highest education level: Not on file  Occupational History  . Occupation: disabled    Employer: DISABLED  Social Needs  . Financial resource strain: Not on file  . Food insecurity:    Worry: Not on file    Inability: Not on file  . Transportation needs:     Medical: Not on file    Non-medical: Not on file  Tobacco Use  . Smoking status: Former Smoker    Packs/day: 1.00    Years: 25.00    Pack years: 25.00    Types: Cigarettes    Last attempt to quit: 08/26/2018  . Smokeless tobacco: Never Used  Substance and Sexual Activity  . Alcohol use: No  . Drug use: No  . Sexual activity: Not Currently    Birth control/protection: Surgical  Lifestyle  . Physical activity:    Days per week: Not on file    Minutes per session: Not on file  . Stress: Not on file  Relationships  . Social connections:    Talks on phone: Not on file    Gets together: Not on file    Attends religious service: Not on file    Active member of club or organization: Not on file    Attends meetings of clubs or organizations: Not on file    Relationship status: Not on file  Other Topics Concern  . Not on file  Social History Narrative  . Not on file     Family History:  The patient's family history includes Heart disease in her mother; Hypertension in her brother and sister.  ROS:   Please see the history of present illness.  All other systems are reviewed and otherwise negative.    PHYSICAL EXAM:   VS:  BP 130/60   Pulse 74   Ht 5\' 1"  (1.549 m)   Wt 183 lb (83 kg)   SpO2 97%   BMI 34.58 kg/m   BMI: Body mass index is 34.58 kg/m. GEN: Chronically ill appearing WF on home O2, in no acute distress HEENT: normocephalic, atraumatic Neck: no JVD, carotid bruits, or masses Cardiac: Reg rhythm, borderline tachycardic no murmurs, rubs, or gallops, no significant edema - trace ankle puffiness, good pedal pulses bilat Respiratory:  Diffuse rhonchi with decreased BS bilaterally, no wheezing, normal work of breathing GI: soft, nontender, nondistended, + BS MS: no deformity or atrophy.  Skin: warm and dry, extensive UE ecchymosis, bilat radial cath sites with mild puffiness with ecchymoses but no firm hematomas, good pulses bilaterally Neuro:  Alert and Oriented x  3, Strength and sensation are intact, follows commands Psych: euthymic mood, full affect  Wt Readings from Last 3 Encounters:  08/27/18 183 lb (83 kg)  08/25/18 194 lb (88 kg)  08/23/18 184 lb 15.5 oz (83.9 kg)      Studies/Labs  Reviewed:   EKG:  EKG was ordered today and personally reviewed by me and demonstrates sinus tach 115bpm, nonspecific mild ST depression inferiorly, V3-V6, upsloped. + TWI I, V2.  Recent Labs: 08/10/2018: B Natriuretic Peptide 32.0 08/14/2018: Magnesium 2.2 08/23/2018: ALT 66; BUN 18; Creatinine, Ser 0.80; Hemoglobin 12.1; Platelets 250; Potassium 3.7; Sodium 135   Lipid Panel    Component Value Date/Time   CHOL 204 (H) 10/13/2017 1001   TRIG 162 (H) 10/13/2017 1001   HDL 59 10/13/2017 1001   CHOLHDL 3.5 10/13/2017 1001   VLDL 25 04/07/2017 1533   LDLCALC 117 (H) 10/13/2017 1001    Additional studies/ records that were reviewed today include: Summarized above.   ASSESSMENT & PLAN:   1. CAD with recent NSTEMI - the patient's EKG shows evolution of recent MI. I suspect her COPD will continue to drive some degree of ischemia with her residual cardiac disease. Fortunately she is feeling much better. Her ecchymosis is impressive. Will check Hgb. Depending on results and resolution may need to consider switching Brilinta to Plavix. I will reach out to Dr. Bronson Ing once this is reviewed. Reviewed instructions for NTG use. 2. Chronic diastolic CHF - appears euvolemic. Reviewed 2g sodium restriction, 2L fluid restriction, daily weights with patient. 3. HTN - controlled. 4. Hyperlipidemia - check CMET today. If stable, recommend repeat in 6 weeks with lipids. One of her LFTs was abnormal in the hospital. 5. Sinus tachycardia - appears chronic, several OP visits in the 100-115 range. Her HR settled down to the low 100s once seated. No acute wheezing. Likely sequelae of respiratory disease. Check TSH with labs. She does not have any signs of acute infection. With  her pulm disease I do not know that she would tolerate escalation of beta blocker.  Disposition: F/u with APP in 4-6 weeks, and f/u MD in 4 months.     Medication Adjustments/Labs and Tests Ordered: Current medicines are reviewed at length with the patient today.  Concerns regarding medicines are outlined above. Medication changes, Labs and Tests ordered today are summarized above and listed in the Patient Instructions accessible in Encounters.   Signed, Charlie Pitter, PA-C  08/27/2018 2:56 PM    [facility] in [place], [place] [address] Ph: [phone]; Fax [phone]

## 2018-08-27 ENCOUNTER — Other Ambulatory Visit (HOSPITAL_COMMUNITY)
Admission: RE | Admit: 2018-08-27 | Discharge: 2018-08-27 | Disposition: A | Discharge status: Home / Self Care | Payer: Medicare Other | Source: Ambulatory Visit | Attending: Physician Assistant | Admitting: Physician Assistant

## 2018-08-27 ENCOUNTER — Other Ambulatory Visit: Payer: Self-pay | Admitting: Family Medicine

## 2018-08-27 ENCOUNTER — Ambulatory Visit (INDEPENDENT_AMBULATORY_CARE_PROVIDER_SITE_OTHER): Payer: Medicare Other | Admitting: Physician Assistant

## 2018-08-27 ENCOUNTER — Encounter: Payer: Self-pay | Admitting: Physician Assistant

## 2018-08-27 VITALS — BP 130/60 | HR 74 | Ht 61.0 in | Wt 183.0 lb

## 2018-08-27 DIAGNOSIS — I10 Essential (primary) hypertension: Secondary | ICD-10-CM

## 2018-08-27 DIAGNOSIS — I5032 Chronic diastolic (congestive) heart failure: Secondary | ICD-10-CM | POA: Insufficient documentation

## 2018-08-27 DIAGNOSIS — R000 Tachycardia, unspecified: Secondary | ICD-10-CM

## 2018-08-27 DIAGNOSIS — E785 Hyperlipidemia, unspecified: Secondary | ICD-10-CM | POA: Diagnosis not present

## 2018-08-27 DIAGNOSIS — I2510 Atherosclerotic heart disease of native coronary artery without angina pectoris: Secondary | ICD-10-CM

## 2018-08-27 LAB — CBC WITH DIFFERENTIAL/PLATELET
BASOS ABS: 0 10*3/uL (ref 0.0–0.1)
Basophils Relative: 0 %
Eosinophils Absolute: 0 10*3/uL (ref 0.0–0.7)
Eosinophils Relative: 0 %
HEMATOCRIT: 39.8 % (ref 36.0–46.0)
HEMOGLOBIN: 13 g/dL (ref 12.0–15.0)
LYMPHS PCT: 9 %
Lymphs Abs: 2.1 10*3/uL (ref 0.7–4.0)
MCH: 31.1 pg (ref 26.0–34.0)
MCHC: 32.7 g/dL (ref 30.0–36.0)
MCV: 95.2 fL (ref 78.0–100.0)
Monocytes Absolute: 1.4 10*3/uL — ABNORMAL HIGH (ref 0.1–1.0)
Monocytes Relative: 6 %
NEUTROS ABS: 21 10*3/uL — AB (ref 1.7–7.7)
NEUTROS PCT: 85 %
Platelets: 284 10*3/uL (ref 150–400)
RBC: 4.18 MIL/uL (ref 3.87–5.11)
RDW: 14.8 % (ref 11.5–15.5)
WBC: 24.6 10*3/uL — ABNORMAL HIGH (ref 4.0–10.5)

## 2018-08-27 LAB — COMPREHENSIVE METABOLIC PANEL
ALK PHOS: 82 U/L (ref 38–126)
ALT: 68 U/L — AB (ref 0–44)
AST: 47 U/L — AB (ref 15–41)
Albumin: 4.2 g/dL (ref 3.5–5.0)
Anion gap: 12 (ref 5–15)
BILIRUBIN TOTAL: 1.6 mg/dL — AB (ref 0.3–1.2)
BUN: 15 mg/dL (ref 8–23)
CALCIUM: 9.9 mg/dL (ref 8.9–10.3)
CO2: 39 mmol/L — ABNORMAL HIGH (ref 22–32)
CREATININE: 0.97 mg/dL (ref 0.44–1.00)
Chloride: 83 mmol/L — ABNORMAL LOW (ref 98–111)
GFR calc Af Amer: >60 mL/min (ref >60)
GLUCOSE: 139 mg/dL — AB (ref 70–99)
POTASSIUM: 3.5 mmol/L (ref 3.5–5.1)
Sodium: 134 mmol/L — ABNORMAL LOW (ref 135–145)
TOTAL PROTEIN: 6.9 g/dL (ref 6.5–8.1)

## 2018-08-27 LAB — TSH: TSH: 2.323 u[IU]/mL (ref 0.350–4.500)

## 2018-08-27 MED ORDER — NITROGLYCERIN 0.4 MG SL SUBL: 0.4000 mg | SUBLINGUAL_TABLET | SUBLINGUAL | 3 refills | Status: AC | PRN

## 2018-08-27 NOTE — Progress Notes (Signed)
SL NG rx'd for the pt per her cardiologist.  She has cardiology f/up today.

## 2018-08-27 NOTE — Patient Instructions (Addendum)
Medication Instructions:  Your physician recommends that you continue on your current medications as directed. Please refer to the Current Medication list given to you today.   Labwork: Your physician recommends that you return for lab work today.    Testing/Procedures: NONE   Follow-Up: Your physician recommends that you schedule a follow-up appointment in: 4-6 Weeks.   Your physician wants you to follow-up in: 4 Months with Dr. Bronson Ing.  You will receive a reminder letter in the mail two months in advance. If you don't receive a letter, please call our office to schedule the follow-up appointment.   Any Other Special Instructions Will Be Listed Below (If Applicable).     If you need a refill on your cardiac medications before your next appointment, please call your pharmacy.  Thank you for choosing [facility]!    Nitroglycerin instructions: If you notice chest pain, sit down and take 1 tablet. If still having pain in 5 minutes, take a 2nd tablet. If still having pain in 5 minutes, take a 3rd tablet and call 911. Notify your cardiologist for any recurrent chest pain.  For patients with congestive heart failure, we give them these special instructions:  1. Follow a low-salt diet - you are allowed no more than 2,000mg  of sodium per day. Watch your fluid intake. In general, you should not be taking in more than 2 liters of fluid per day (no more than 8 glasses per day). This includes sources of water in foods like soup, coffee, tea, milk, etc. 2. Weigh yourself on the same scale at same time of day and keep a log. 3. Call your doctor: (Anytime you feel any of the following symptoms)  - 3lb weight gain overnight or 5lb within a few days - Shortness of breath, with or without a dry hacking cough  - Swelling in the hands, feet or stomach  - If you have to sleep on extra pillows at night in order to breathe   IT IS IMPORTANT TO LET YOUR DOCTOR KNOW EARLY ON IF YOU ARE  HAVING SYMPTOMS SO WE CAN HELP YOU!

## 2018-08-31 ENCOUNTER — Telehealth: Payer: Self-pay | Admitting: *Deleted

## 2018-08-31 DIAGNOSIS — Z79899 Other long term (current) drug therapy: Secondary | ICD-10-CM

## 2018-08-31 MED ORDER — ATORVASTATIN CALCIUM 40 MG PO TABS: 40.0000 mg | ORAL_TABLET | Freq: Every day | ORAL | 3 refills | Status: DC

## 2018-08-31 NOTE — Telephone Encounter (Signed)
-----  Message from Charlie Pitter, [place] sent at 08/31/2018  8:19 AM EDT ----- Alda Berthold - please call patient - labs appear stable except for a few abnormalities, some of which I think are due to her COPD (sodium, chloride, bicarb). Liver function appears somewhat abnormal but was recently elevated as well. Would cut atorvastatin in 1/2 and recheck CMET in 1 week. Her WBC continues to appear markedly elevated. She is only on a little bit of oral prednisone so I question whether she needs hematology eval. Neutrophils are also elevated. I will route to PCP as well as recent primary care APP that saw her to make them aware.  I will also route question to Dr. Bronson Ing James[place] So, I saw this patient of yours in clinic s/p PCI this past week. You met her in the hospital recently. Given her NSTEMI/PCI, she was placed on ASA and Brilinta. Apparently in the hospital they noted marked UE ecchymosis. In clinic it was extremely pronounced - almost like bilateral sleeves of bruising. She said it was from the BP cuffs at the hospital and various procedures. She felt it was relatively stable/possibly improving but was quite impressive. She is on chronic steroids for COPD. Just wanted to know your thoughts on continuing the Brilinta or changing to Plavix given easy bruising - hemoglobin remains stable. Her WBC is quite elevated but IM has thought this is due to steroids (may need a relook by PCP though because it seems higher than I would expect). Thanks in advance. Dayna Dunn PA-C  Dayna Dunn PA-C

## 2018-09-01 ENCOUNTER — Other Ambulatory Visit: Payer: Self-pay | Admitting: Family Medicine

## 2018-09-01 ENCOUNTER — Other Ambulatory Visit: Payer: Self-pay

## 2018-09-01 DIAGNOSIS — D72829 Elevated white blood cell count, unspecified: Secondary | ICD-10-CM

## 2018-09-01 NOTE — Patient Outreach (Signed)
[facility]) Care Management  09/01/2018  Cindy [name] [birth year] [id number]    Referral received from [facility] for post hospital follow-up and management of COPD. Initial outreach successful. [name] agreeable to home visit with RN CM on 09/08/18.   PLAN RN CM will follow up on next week for initial home visit.   [place] [phone]

## 2018-09-01 NOTE — Discharge Summary (Addendum)
Physician Discharge Summary  Cindy [name] HAL:[id number] DOB: [birth year] DOA: 08/10/2018  PCP: Alycia Rossetti, MD  Admit date: 08/10/2018 Discharge date: 08/21/2018  Time spent: 35 minutes  Recommendations for Outpatient Follow-up:  1. [place] in 1 week 2. Cardiology Dr.Koneswaran in 2 weeks   Discharge Diagnoses:  Principal Problem:   NSTEMI   COPD with acute exacerbation ([place])   Tobacco abuse   Severe COPD/chronic respiratory failure on 4 L home O2 at baseline   Hyperglycemia, drug-induced   Hyperlipidemia   GERD (gastroesophageal reflux disease)   Sinus tachycardia   COPD exacerbation ([facility])   Hypercarbia   Bilateral leg edema   Hypokalemia   Abnormal EKG   Diastolic dysfunction   Acute on chronic respiratory failure ([facility])   Non-ST elevation (NSTEMI) myocardial infarction [facility])   Essential hypertension   Orthopnea   Acute on chronic diastolic heart failure ([facility])   S/P PTCA (percutaneous transluminal coronary angioplasty)   Discharge Condition: stable  Diet recommendation: heart healthy  Filed Weights   08/19/18 0444 08/20/18 1824 08/21/18 0438  Weight: 85.3 kg 84.2 kg 83.9 kg    History of present illness:  62 year old female severe COPD and 40 L home O2, ongoing tobacco abuse, chronic diastolic CHF, type 2 diabetes mellitus presented to the ED with progressive shortness of breath and leg swelling  Hospital Course:   NSTEMI -Cardiology consulted, treated with IV heparin, started low-dose beta blocker and dual antiplatelets along with statins -status post diagnostic cath, this noted severe 2 vessel disease and elevated LVEDP, after diuresis, she went back for left heart catheterization  8/22, she had severe calcified proximal/ostial LAD stenosis treated with complicated PCI/drug-eluting stent -clinically stable now, anxious to be discharged home and cleared by cardiology -Completed cardiac rehabilitation -Discharged home on aspirin, Brillinta, low-dose  metoprolol, statins -Cardiology to arrange follow-up with Dr.Koneswaran in [place]  Acute on chronic systolic CHF Found to have E 40% and orthopnea and elevated EDP during LHC on 8/19, started Lasix. -she is 6 L negative as far -clinically euvolemic at this time transitioned to oral Lasix 40 mg daily  COPD exacerbation -Treated with IV steroids, antibiotics and nebulizations -Continue home ICS/LABA -worsening leukocytosis secondary to IV steroids and stress demargination, repeat x-ray was unremarkable, remains afebrile  Diabetes mellitus type II -stable, continued sliding scale insulin  Smoking Cessation recommended, modalities discussed  GERD -Continue PPI -H2RA PRN  Consultants:   Cardiology  Procedures:   Echo 8/14 LV EF: 55%  Repeat left heart catheterization 8/22 FINDINGS: 1. Severe, calcified ostial/proximal LAD stenosis. 2. Complicated IVUS-guided PCI to ostial/proximal LAD using orbital atherectomy and overlapping Synergy 4.0 x 12 mm and 3.0 x 20 mm drug-eluting stents with 0% residual stenosis and TIMI-3 flow.  Procedure was complicated by abrupt closure of the proximal LAD, likely due to dissection during atherectomy.  Vessel was successfully opened with resolution of chest pain. 3. Mildly elevated LVEDP. 4. Right upper arm hematoma from venous bleeding at previous IV site due to intraprocedural anticoagulation.  RECOMMENDATIONS: 1. DAPT with ASA and ticagrelor for at least 12 months. 2. Aggressive secondary prevention. 3. Limited echo to reassess LVEF and exclude pericardial effusion.   Discharge Exam: Vitals:   08/21/18 0737 08/21/18 0854  BP:  109/85  Pulse:  (!) 103  Resp:  20  Temp:  97.9 F (36.6 C)  SpO2: 97% 100%    General:  AAOx3 Cardiovascular: S1S2/RRR Respiratory: CTAB  Discharge Instructions   Discharge Instructions    Amb  Referral to Cardiac Rehabilitation   Complete by:  As directed    Diagnosis:   Coronary  Stents NSTEMI     Diet - low sodium heart healthy   Complete by:  As directed    Increase activity slowly   Complete by:  As directed      Allergies as of 08/21/2018      Reactions   Keflex [cephalexin] Anaphylaxis, Rash   Doxycycline Other (See Comments)   Gave patient oral thrush   Sulfa Antibiotics    Avelox [moxifloxacin Hcl In Nacl] Itching, Rash   Toradol [ketorolac Tromethamine] Swelling, Rash, Other (See Comments)   Bruising, swelling, rash at injection site      Medication List    STOP taking these medications   simvastatin 40 MG tablet Commonly known as:  ZOCOR     TAKE these medications   albuterol 108 (90 Base) MCG/ACT inhaler Commonly known as:  PROVENTIL HFA;VENTOLIN HFA Inhale 2 puffs into the lungs every 6 (six) hours as needed for wheezing or shortness of breath. What changed:  Another medication with the same name was changed. Make sure you understand how and when to take each.   albuterol (2.5 MG/3ML) 0.083% nebulizer solution Commonly known as:  PROVENTIL INHALE 1 VIAL VIA NEBULIZER EVERY 6 HOURS AS NEEDED. What changed:  See the new instructions.   alendronate 70 MG tablet Commonly known as:  FOSAMAX Take 1 tablet (70 mg total) by mouth every Wednesday. TAKE 1 TABLET BY MOUTH EVERY WEEK ON AN EMPTY STOMACH. What changed:  See the new instructions.   aspirin EC 81 MG tablet Take 81 mg by mouth every morning.   budesonide-formoterol 160-4.5 MCG/ACT inhaler Commonly known as:  SYMBICORT Inhale 2 puffs into the lungs 2 (two) times daily.   Calcium Carb-Cholecalciferol 600-800 MG-UNIT Tabs Take 1 tablet by mouth 2 (two) times daily.   cyclobenzaprine 10 MG tablet Commonly known as:  FLEXERIL TAKE 1 TABLET BY MOUTH 3 TIMES A DAY AS NEEDED FOR MUSCLE SPASMS.   diphenhydrAMINE 12.5 MG/5ML liquid Commonly known as:  BENADRYL Take 6.25 mg by mouth 4 (four) times daily as needed.   furosemide 40 MG tablet Commonly known as:  LASIX Take 1 tablet  (40 mg total) by mouth daily. What changed:    how much to take  when to take this  additional instructions   metoprolol tartrate 25 MG tablet Commonly known as:  LOPRESSOR Take 0.5 tablets (12.5 mg total) by mouth 2 (two) times daily.   nystatin 100000 UNIT/ML suspension Commonly known as:  MYCOSTATIN Use as directed 5 mLs (500,000 Units total) in the mouth or throat 4 (four) times daily.   OXYGEN Inhale 3 L into the lungs continuous.   pantoprazole 40 MG tablet Commonly known as:  PROTONIX Take 1 tablet (40 mg total) by mouth 2 (two) times daily.   polyethylene glycol packet Commonly known as:  MIRALAX / GLYCOLAX Take 17 g by mouth daily.   predniSONE 10 MG tablet Commonly known as:  DELTASONE Take 1-4 tablets (10-40 mg total) by mouth daily with breakfast. Take 40mg  daily for 2days then 20mg  daily for 2days then go back to Basal dose of 10mg  daily What changed:    how much to take  how to take this  when to take this  additional instructions   ticagrelor 90 MG Tabs tablet Commonly known as:  BRILINTA Take 1 tablet (90 mg total) by mouth 2 (two) times daily.   Tiotropium  Bromide Monohydrate 2.5 MCG/ACT Aers INHALE 2 PUFFS INTO THE LUNGS ONCE DAILY What changed:    how much to take  how to take this  when to take this  additional instructions      Allergies  Allergen Reactions  . Keflex [Cephalexin] Anaphylaxis and Rash  . Doxycycline Other (See Comments)    Gave patient oral thrush  . Sulfa Antibiotics   . Avelox [Moxifloxacin Hcl In Nacl] Itching and Rash  . Toradol [Ketorolac Tromethamine] Swelling, Rash and Other (See Comments)    Bruising, swelling, rash at injection site   Follow-up Information    [place], Modena Nunnery, MD. Schedule an appointment as soon as possible for a visit in 1 week(s).   Specialty:  Family Medicine Contact information: [address] [place] [address] [phone]        Herminio Commons, MD.  Go in 2 week(s).   Specialty:  Cardiology Contact information: [place] [address] [phone]        Collene Gobble, MD. Schedule an appointment as soon as possible for a visit in 1 month(s).   Specialty:  Pulmonary Disease Contact information: [address] [address] [phone]        [facility] Follow [facility].   Specialty:  Home Health Services Why:  for home health PT and OT. They will call you Monday to set up a time to come see you Contact information: [address] [place] [address] [phone]            The results of significant diagnostics from this hospitalization (including imaging, microbiology, ancillary and laboratory) are listed below for reference.    Significant Diagnostic Studies: Dg Chest 2 View  Result Date: 08/23/2018 CLINICAL DATA:  Shortness of breath, cough. EXAM: CHEST - 2 VIEW COMPARISON:  Radiographs of August 18, 2018. FINDINGS: The heart size and mediastinal contours are within normal limits. Both lungs are clear. No pneumothorax or pleural effusion is noted. The visualized skeletal structures are unremarkable. IMPRESSION: No active cardiopulmonary disease. Electronically Signed   By: Marijo Conception, M.D.   On: 08/23/2018 08:53   Dg Chest 2 View  Result Date: 08/18/2018 CLINICAL DATA:  Weakness and shortness of breath today. EXAM: CHEST - 2 VIEW COMPARISON:  Single-view of the chest 08/13/2018. CT chest, abdomen and pelvis 06/04/2018. FINDINGS: The lungs are emphysematous but clear. Heart size is normal. Aortic atherosclerosis is noted. No pneumothorax or pleural effusion. T11 compression fracture is identified as seen on the prior CT. Vertebral body height loss is up to 90% and worse than on the prior CT. No new fracture is identified. IMPRESSION: No acute disease. Emphysema. T11 compression fracture seen on 06/04/2018 CT scan demonstrates increased vertebral body height loss since  that study. Electronically Signed   By: Inge Rise M.D.   On: 08/18/2018 11:28   Dg Lumbar Spine Complete  Result Date: 08/23/2018 CLINICAL DATA:  Right leg and low back pain with no apparent injury EXAM: LUMBAR SPINE - COMPLETE 4+ VIEW COMPARISON:  Coronal and sagittal reconstructed images through the lumbar spine from an abdominal and pelvic CT scan of May 25, 2018. FINDINGS: There is chronic partial compression of the body of L3 centered on the inferior endplate. The anterior height loss is approximately 20%. The posterior height loss is 10% or less. There is no retropulsion of bone. The disc space heights are well maintained. There is no spondylolisthesis.  No pars defects are observed. The pedicles and transverse processes are intact. The observed portions of the sacrum are normal. There are calcifications within both kidneys. 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 No significant disc space narrowing. No spondylolisthesis. Bilateral kidney stones. Electronically Signed   By: David  Martinique M.D.   On: 08/23/2018 12:47   US Venous Img Lower Bilateral  Result Date: 08/11/2018 CLINICAL DATA:  Bilateral lower extremity edema. History of left-sided Baker cyst. Evaluate for DVT. EXAM: BILATERAL LOWER EXTREMITY VENOUS DOPPLER ULTRASOUND TECHNIQUE: Gray-scale sonography with graded compression, as well as color Doppler and duplex ultrasound were performed to evaluate the lower extremity deep venous systems from the level of the common femoral vein and including the common femoral, femoral, profunda femoral, popliteal and calf veins including the posterior tibial, peroneal and gastrocnemius veins when visible. The superficial great saphenous vein was also interrogated. Spectral Doppler was utilized to evaluate flow at rest and with distal augmentation maneuvers in the common femoral, femoral and popliteal veins. COMPARISON:  Left lower extremity venous Doppler ultrasound-05/25/2018  FINDINGS: RIGHT LOWER EXTREMITY Common Femoral Vein: No evidence of thrombus. Normal compressibility, respiratory phasicity and response to augmentation. Saphenofemoral Junction: No evidence of thrombus. Normal compressibility and flow on color Doppler imaging. Profunda Femoral Vein: No evidence of thrombus. Normal compressibility and flow on color Doppler imaging. Femoral Vein: No evidence of thrombus. Normal compressibility, respiratory phasicity and response to augmentation. Popliteal Vein: No evidence of thrombus. Normal compressibility, respiratory phasicity and response to augmentation. Calf Veins: No evidence of thrombus. Normal compressibility and flow on color Doppler imaging. Superficial Great Saphenous Vein: No evidence of thrombus. Normal compressibility. Venous Reflux:  None. Other Findings:  None. LEFT LOWER EXTREMITY Common Femoral Vein: No evidence of thrombus. Normal compressibility, respiratory phasicity and response to augmentation. Saphenofemoral Junction: No evidence of thrombus. Normal compressibility and flow on color Doppler imaging. Profunda Femoral Vein: No evidence of thrombus. Normal compressibility and flow on color Doppler imaging. Femoral Vein: No evidence of thrombus. Normal compressibility, respiratory phasicity and response to augmentation. Popliteal Vein: No evidence of thrombus. Normal compressibility, respiratory phasicity and response to augmentation. Calf Veins: No evidence of thrombus. Normal compressibility and flow on color Doppler imaging. Superficial Great Saphenous Vein: No evidence of thrombus. Normal compressibility. Venous Reflux:  None. Other Findings: There is a serpiginous approximately 5.7 x 6.2 x 2.7 cm fluid collection within the left popliteal fossa compatible with a Baker cyst, grossly unchanged compared to the 04/2018 examination, previously, 5.9 cm IMPRESSION: 1. No evidence of DVT within either lower extremity. 2. Grossly unchanged approximately 5.7 cm  minimally complex left-sided Baker cyst. Electronically Signed   By: Sandi Mariscal M.D.   On: 08/11/2018 11:12   US Venous Img Lower Unilateral Right  Result Date: 08/23/2018 CLINICAL DATA:  Right lower extremity pain and edema since this past Friday. Evaluate for DVT. EXAM: RIGHT LOWER EXTREMITY VENOUS DOPPLER ULTRASOUND TECHNIQUE: Gray-scale sonography with graded compression, as well as color Doppler and duplex ultrasound were performed to evaluate the lower extremity deep venous systems from the level of the common femoral vein and including the common femoral, femoral, profunda femoral, popliteal and calf veins including the posterior tibial, peroneal and gastrocnemius veins when visible. The superficial great saphenous vein was also interrogated. Spectral Doppler was utilized to evaluate flow at rest and with distal augmentation maneuvers in the common femoral, femoral and popliteal veins. COMPARISON:  None. FINDINGS: Contralateral Common Femoral Vein: Respiratory phasicity is normal and symmetric  with the symptomatic side. No evidence of thrombus. Normal compressibility. Common Femoral Vein: No evidence of thrombus. Normal compressibility, respiratory phasicity and response to augmentation. Saphenofemoral Junction: No evidence of thrombus. Normal compressibility and flow on color Doppler imaging. Profunda Femoral Vein: No evidence of thrombus. Normal compressibility and flow on color Doppler imaging. Femoral Vein: No evidence of thrombus. Normal compressibility, respiratory phasicity and response to augmentation. Popliteal Vein: No evidence of thrombus. Normal compressibility, respiratory phasicity and response to augmentation. Calf Veins: No evidence of thrombus. Normal compressibility and flow on color Doppler imaging. Superficial Great Saphenous Vein: No evidence of thrombus. Normal compressibility. Venous Reflux:  None. Other Findings:  None. IMPRESSION: No evidence of DVT within the right lower  extremity. Electronically Signed   By: Sandi Mariscal M.D.   On: 08/23/2018 09:25   Dg Chest Port 1 View  Result Date: 08/13/2018 CLINICAL DATA:  Respiratory distress. EXAM: PORTABLE CHEST 1 VIEW COMPARISON:  08/10/2018 FINDINGS: The heart size and mediastinal contours are within normal limits. Both lungs are clear. The visualized skeletal structures are unremarkable. IMPRESSION: No active disease. Electronically Signed   By: Kathreen Devoid   On: 08/13/2018 15:38   Dg Chest Port 1 View  Result Date: 08/10/2018 CLINICAL DATA:  Shortness of breath EXAM: PORTABLE CHEST 1 VIEW COMPARISON:  05/25/2018, CT 06/04/2018 FINDINGS: Borderline to mild cardiomegaly. No acute airspace disease or pleural effusion. No pneumothorax. IMPRESSION: No active disease.  Borderline to mild cardiomegaly. Electronically Signed   By: Donavan Foil M.D.   On: 08/10/2018 17:58    Microbiology: No results found for this or any previous visit (from the past 240 hour(s)).   Labs: Basic Metabolic Panel: Recent Labs  Lab 08/27/18 1606  NA 134*  K 3.5  CL 83*  CO2 39*  GLUCOSE 139*  BUN 15  CREATININE 0.97  CALCIUM 9.9   Liver Function Tests: Recent Labs  Lab 08/27/18 1606  AST 47*  ALT 68*  ALKPHOS 82  BILITOT 1.6*  PROT 6.9  ALBUMIN 4.2   No results for input(s): LIPASE, AMYLASE in the last 168 hours. No results for input(s): AMMONIA in the last 168 hours. CBC: Recent Labs  Lab 08/27/18 1606  WBC 24.6*  NEUTROABS 21.0*  HGB 13.0  HCT 39.8  MCV 95.2  PLT 284   Cardiac Enzymes: No results for input(s): CKTOTAL, CKMB, CKMBINDEX, TROPONINI in the last 168 hours. BNP: BNP (last 3 results) Recent Labs    06/02/18 1220 07/09/18 1059 08/10/18 1735  BNP 12 11 32.0    ProBNP (last 3 results) No results for input(s): PROBNP in the last 8760 hours.  CBG: No results for input(s): GLUCAP in the last 168 hours.     Signed:  Domenic Polite MD.  [facility] 09/01/2018, 4:59 PM

## 2018-09-03 ENCOUNTER — Telehealth: Payer: Self-pay

## 2018-09-03 ENCOUNTER — Encounter: Payer: Self-pay | Admitting: Family Medicine

## 2018-09-03 ENCOUNTER — Ambulatory Visit (INDEPENDENT_AMBULATORY_CARE_PROVIDER_SITE_OTHER): Payer: Medicare Other | Admitting: Family Medicine

## 2018-09-03 VITALS — BP 134/72 | HR 118 | Temp 98.0°F | Resp 20 | Ht 62.0 in | Wt 184.0 lb

## 2018-09-03 DIAGNOSIS — S31000A Unspecified open wound of lower back and pelvis without penetration into retroperitoneum, initial encounter: Secondary | ICD-10-CM

## 2018-09-03 DIAGNOSIS — J441 Chronic obstructive pulmonary disease with (acute) exacerbation: Secondary | ICD-10-CM

## 2018-09-03 DIAGNOSIS — I2510 Atherosclerotic heart disease of native coronary artery without angina pectoris: Secondary | ICD-10-CM | POA: Diagnosis not present

## 2018-09-03 MED ORDER — METHYLPREDNISOLONE ACETATE 40 MG/ML IJ SUSP
60.0000 mg | Freq: Once | INTRAMUSCULAR | Status: AC
  Administered 2018-09-03: 60 mg via INTRAMUSCULAR

## 2018-09-03 NOTE — Telephone Encounter (Signed)
Customer called and requested medication for her gluteal where she states she caught something while in the hospital. Patient states she has sores on her buttock that are burning and  And she has been using clotrimazole that was previously given for something else and it is not helping.  I explained to patient we could not prescribe a medication without seeing the patient first. Patient states she has a lot of appointments and can not be seen. I advised patient she could also go to an urgent care if that was more convenientt for her. Patient verbalized understanding

## 2018-09-03 NOTE — Progress Notes (Signed)
Subjective:    Patient ID: Cindy [name], female    DOB: 07-04-1956, 62 y.o.   MRN: [id number]  HPI  Patient was recently admitted with a non-ST elevation myocardial infarction on August 24.  Around the time she was hospitalized, she developed a "wound on her sacral area".  She has been applying Neosporin and bandages but it will not heal.  She came in today for a second opinion.  On examination, there are ulcers scattered on either side of the gluteal cleft.  There is a large horizontal ulcer with several scabbed over satellite ulcers in a group around it.  On the right side of the gluteal cleft there are several small scabbed over ulcers that are approximately 4 to 5 mm in diameter.  These appear to be ruptured scabbed over vesicles.  The presentation is concerning for shingles approximately 2 weeks out.  Patient states that the area has been burning and hurting and is very painful.  There is no evidence of secondary cellulitis and the wounds appear to be healing well.  She also reports increasing shortness of breath and wheezing.  She has decreased breath sounds bilaterally with diffuse expiratory wheezing and increased respiratory rate.  She also has faint rhonchi bilaterally.  She denies any fever or purulent sputum Past Medical History:  Diagnosis Date  . 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 Arthritis   . CAD (coronary artery disease)    a. NSTEMI 07/2018 s/p difficult PCI with overlapping DES to LAD, residual OM2 disease treated medically.  . Chronic diastolic CHF (congestive heart failure) ([place])   . Chronic pain   . Chronic respiratory failure ([place]) On home 02 2-3L  . Compression fracture of lumbar vertebra ([place])   . COPD (chronic obstructive pulmonary disease) ([place])   . Essential hypertension   . Hyperglycemia, drug-induced   . Hyperlipidemia   . Kidney stones 2014   Left side, multiple  . Leukocytosis   . Osteoporosis 2013  . Thyroid disease    pt denies  . Tobacco abuse    Past  Surgical History:  Procedure Laterality Date  . CERVICAL BIOPSY    . COLONOSCOPY N/A 03/31/2013   Procedure: COLONOSCOPY;  Surgeon: Rogene Houston, MD;  [facility];  Service: Endoscopy;  Laterality: N/A;  730  . CORONARY ATHERECTOMY N/A 08/19/2018   Procedure: CORONARY ATHERECTOMY;  Surgeon: Nelva Bush, MD;  Location: [facility] [facility];  Service: Cardiovascular;  Laterality: N/A;  . CORONARY STENT INTERVENTION N/A 08/19/2018   Procedure: CORONARY STENT INTERVENTION;  Surgeon: Nelva Bush, MD;  Location: [place] [facility];  Service: Cardiovascular;  Laterality: N/A;  . INTRAVASCULAR ULTRASOUND/IVUS N/A 08/19/2018   Procedure: Intravascular Ultrasound/IVUS;  Surgeon: Nelva Bush, MD;  Location: [place] [facility];  Service: Cardiovascular;  Laterality: N/A;  . LEFT HEART CATH N/A 08/19/2018   Procedure: Left Heart Cath;  Surgeon: Nelva Bush, MD;  Location: [place] [facility];  Service: Cardiovascular;  Laterality: N/A;  . LEFT HEART CATH AND CORONARY ANGIOGRAPHY N/A 08/16/2018   Procedure: LEFT HEART CATH AND CORONARY ANGIOGRAPHY;  Surgeon: Nelva Bush, MD;  Location: [place] [facility];  Service: Cardiovascular;  Laterality: N/A;  . TUBAL LIGATION     Current Outpatient Medications on File Prior to Visit  Medication Sig Dispense Refill  . albuterol (PROVENTIL HFA;VENTOLIN HFA) 108 (90 Base) MCG/ACT inhaler Inhale 2 puffs into the lungs every 6 (six) hours as needed for wheezing or shortness of  breath. 1 Inhaler 5  . albuterol (PROVENTIL) (2.5 MG/3ML) 0.083% nebulizer solution INHALE 1 VIAL VIA NEBULIZER EVERY 6 HOURS AS NEEDED. (Patient taking differently: Take 2.5 mg by nebulization every 6 (six) hours as needed for wheezing or shortness of breath. ) 375 mL 0  . alendronate (FOSAMAX) 70 MG tablet Take 1 tablet (70 mg total) by mouth every Wednesday. TAKE 1 TABLET BY MOUTH EVERY WEEK ON AN EMPTY STOMACH. 4 tablet 1  . aspirin EC 81 MG tablet Take 81 mg by  mouth every morning.     Marland Kitchen atorvastatin (LIPITOR) 40 MG tablet Take 1 tablet (40 mg total) by mouth daily. 90 tablet 3  . budesonide-formoterol (SYMBICORT) 160-4.5 MCG/ACT inhaler Inhale 2 puffs into the lungs 2 (two) times daily. 10.2 g 11  . Calcium Carb-Cholecalciferol 600-800 MG-UNIT TABS Take 1 tablet by mouth 2 (two) times daily.     . cyclobenzaprine (FLEXERIL) 10 MG tablet TAKE 1 TABLET BY MOUTH 3 TIMES A DAY AS NEEDED FOR MUSCLE SPASMS. 30 tablet 0  . cyclobenzaprine (FLEXERIL) 10 MG tablet TAKE 1 TABLET BY MOUTH 3 TIMES A DAY AS NEEDED FOR MUSCLE SPASMS. 30 tablet 0  . diphenhydrAMINE (BENADRYL) 12.5 MG/5ML liquid Take 6.25 mg by mouth 4 (four) times daily as needed.    . furosemide (LASIX) 40 MG tablet Take 1 tablet (40 mg total) by mouth daily. (Patient taking differently: Take 40-80 mg by mouth See admin instructions. Alternate taking 80mg  one day, then take 40mg  the next day, then repeat.) 90 tablet 3  . metoprolol tartrate (LOPRESSOR) 25 MG tablet Take 0.5 tablets (12.5 mg total) by mouth 2 (two) times daily. 30 tablet 0  . nitroGLYCERIN (NITROSTAT) 0.4 MG SL tablet Place 1 tablet (0.4 mg total) under the tongue every 5 (five) minutes as needed for chest pain. If no improvement with 3 doses call 911 50 tablet 3  . nystatin (MYCOSTATIN) 100000 UNIT/ML suspension Use as directed 5 mLs (500,000 Units total) in the mouth or throat 4 (four) times daily. 240 mL 3  . oxyCODONE-acetaminophen (PERCOCET/ROXICET) 5-325 MG tablet Take 1 tablet by mouth every 4 (four) hours as needed for severe pain. 15 tablet 0  . OXYGEN Inhale 3 L into the lungs continuous.     . pantoprazole (PROTONIX) 40 MG tablet Take 1 tablet (40 mg total) by mouth 2 (two) times daily. 180 tablet 2  . polyethylene glycol (MIRALAX / GLYCOLAX) packet Take 17 g by mouth daily. 14 each 0  . predniSONE (DELTASONE) 10 MG tablet Take 1-4 tablets (10-40 mg total) by mouth daily with breakfast. Take 40mg  daily for 2days then 20mg  daily  for 2days then go back to Basal dose of 10mg  daily (Patient taking differently: Take 10 mg by mouth daily with breakfast. Basal dose of 10mg  daily)    . ticagrelor (BRILINTA) 90 MG TABS tablet Take 1 tablet (90 mg total) by mouth 2 (two) times daily. 60 tablet 0  . Tiotropium Bromide Monohydrate (SPIRIVA RESPIMAT) 2.5 MCG/ACT AERS INHALE 2 PUFFS INTO THE LUNGS ONCE DAILY (Patient taking differently: Inhale 2 puffs into the lungs at bedtime. ) 4 g 11   No current facility-administered medications on file prior to visit.    Allergies  Allergen Reactions  . Keflex [Cephalexin] Anaphylaxis and Rash  . Doxycycline Other (See Comments)    Gave patient oral thrush  . Sulfa Antibiotics   . Avelox [Moxifloxacin Hcl In Nacl] Itching and Rash  . Toradol [Ketorolac Tromethamine] Swelling, Rash  and Other (See Comments)    Bruising, swelling, rash at injection site   Social History   Socioeconomic History  . Marital status: Divorced    Spouse name: Not on file  . Number of children: 3  . Years of education: Not on file  . Highest education level: Not on file  Occupational History  . Occupation: disabled    Employer: DISABLED  Social Needs  . Financial resource strain: Not on file  . Food insecurity:    Worry: Not on file    Inability: Not on file  . Transportation needs:    Medical: Not on file    Non-medical: Not on file  Tobacco Use  . Smoking status: Current Some Day Smoker    Packs/day: 1.00    Years: 25.00    Pack years: 25.00    Types: Cigarettes  . Smokeless tobacco: Never Used  Substance and Sexual Activity  . Alcohol use: No  . Drug use: No  . Sexual activity: Not Currently    Birth control/protection: Surgical  Lifestyle  . Physical activity:    Days per week: Not on file    Minutes per session: Not on file  . Stress: Not on file  Relationships  . Social connections:    Talks on phone: Not on file    Gets together: Not on file    Attends religious service: Not on  file    Active member of club or organization: Not on file    Attends meetings of clubs or organizations: Not on file    Relationship status: Not on file  . Intimate partner violence:    Fear of current or ex partner: Not on file    Emotionally abused: Not on file    Physically abused: Not on file    Forced sexual activity: Not on file  Other Topics Concern  . Not on file  Social History Narrative  . Not on file       Review of Systems  All other systems reviewed and are negative.      Objective:   Physical Exam  Neck: No JVD present.  Cardiovascular: Normal rate and regular rhythm.  Pulmonary/Chest: No respiratory distress. She has decreased breath sounds in the right upper field, the right middle field, the right lower field, the left upper field, the left middle field and the left lower field. She has wheezes in the right upper field, the right middle field, the right lower field, the left upper field, the left middle field and the left lower field. She has rhonchi in the right lower field and the left lower field.  Abdominal: Soft. Bowel sounds are normal.  Musculoskeletal: She exhibits no edema.       Legs: Skin: Rash noted. Rash is vesicular. No erythema.  Vitals reviewed.         Assessment & Plan:  COPD exacerbation ([place]) - Plan: methylPREDNISolone acetate (DEPO-MEDROL) injection 60 mg  Wound of sacral region, initial encounter  Differential diagnosis for the sacral wound includes pressure ulcer from lying in a hospital bed versus shingles to the recent physical stress in her illness.  At this point she is too far out to begin Valtrex.  Therefore I will give the patient zinc oxide barrier cream for comfort.  She can apply it to the affected area 2-3 times a day and then recheck the wounds next week to ensure healing.  I believe her shortness of breath reflects a COPD exacerbation rather than  pneumonia or CHF.  I will give the patient Depo-Medrol 60 mg IM x1 and  then a burst of steroids.  She will take prednisone 60 g tomorrow, 50 mg on Sunday, 40 mg on Monday, 30 mg on Tuesday, 20 mg on Wednesday, and then resume her normal 10 mg a day on Thursday.  Recheck here next week or immediately if worsening.

## 2018-09-08 ENCOUNTER — Telehealth: Payer: Self-pay | Admitting: *Deleted

## 2018-09-08 ENCOUNTER — Ambulatory Visit: Payer: Self-pay

## 2018-09-08 ENCOUNTER — Other Ambulatory Visit: Payer: Self-pay | Admitting: Family Medicine

## 2018-09-08 ENCOUNTER — Other Ambulatory Visit: Payer: Self-pay

## 2018-09-08 MED ORDER — CLOPIDOGREL BISULFATE 75 MG PO TABS: 75.0000 mg | ORAL_TABLET | Freq: Every day | ORAL | 3 refills | Status: DC

## 2018-09-08 NOTE — Telephone Encounter (Signed)
Error

## 2018-09-08 NOTE — Telephone Encounter (Signed)
Ok to refill??  Last office visit 09/03/2018.  Last refill 08/23/2018 from ER, #[id number].

## 2018-09-08 NOTE — Telephone Encounter (Signed)
-----   Message from Charlie Pitter, [place] sent at 09/06/2018  3:52 PM EDT ----- Per Drr. Koneswaran's reply, he states "I think it would be reasonable to switch to Plavix." Per our discussion he does not feel she would need a load. Please notify pt to take last dose of Brilinta in the PM, and then start Plavix/clopidogrel 75mg  daily QAM.  Please make sure to move this to a phone note so its viewable in her main chart too. Thanks. Dayna Dunn PA-C

## 2018-09-10 ENCOUNTER — Other Ambulatory Visit: Payer: Self-pay

## 2018-09-10 NOTE — Patient Outreach (Signed)
[facility]) Care Management  09/10/2018  DEZIREA MCCOLLISTER February 13, 1956 [id number]   Outreach to confirm member's availability for home visit. Ms. Lafond informed RNCM of pending morning appointment on 09/13/18 and requested that her home visit be rescheduled for a later time in the afternoon. She agreed to follow up with [facility] if appointment needs to be cancelled.   [place] [phone]

## 2018-09-13 ENCOUNTER — Telehealth: Payer: Self-pay | Admitting: *Deleted

## 2018-09-13 ENCOUNTER — Ambulatory Visit (INDEPENDENT_AMBULATORY_CARE_PROVIDER_SITE_OTHER): Payer: Medicare Other | Admitting: *Deleted

## 2018-09-13 ENCOUNTER — Other Ambulatory Visit: Payer: Medicare Other

## 2018-09-13 ENCOUNTER — Other Ambulatory Visit: Payer: Self-pay | Admitting: *Deleted

## 2018-09-13 ENCOUNTER — Other Ambulatory Visit: Payer: Self-pay

## 2018-09-13 ENCOUNTER — Ambulatory Visit: Payer: Self-pay

## 2018-09-13 DIAGNOSIS — I110 Hypertensive heart disease with heart failure: Secondary | ICD-10-CM | POA: Diagnosis not present

## 2018-09-13 DIAGNOSIS — I214 Non-ST elevation (NSTEMI) myocardial infarction: Secondary | ICD-10-CM | POA: Diagnosis not present

## 2018-09-13 DIAGNOSIS — K219 Gastro-esophageal reflux disease without esophagitis: Secondary | ICD-10-CM | POA: Diagnosis not present

## 2018-09-13 DIAGNOSIS — Z79899 Other long term (current) drug therapy: Secondary | ICD-10-CM

## 2018-09-13 DIAGNOSIS — J441 Chronic obstructive pulmonary disease with (acute) exacerbation: Secondary | ICD-10-CM | POA: Diagnosis not present

## 2018-09-13 DIAGNOSIS — Z23 Encounter for immunization: Secondary | ICD-10-CM

## 2018-09-13 DIAGNOSIS — M1991 Primary osteoarthritis, unspecified site: Secondary | ICD-10-CM | POA: Diagnosis not present

## 2018-09-13 DIAGNOSIS — J9621 Acute and chronic respiratory failure with hypoxia: Secondary | ICD-10-CM | POA: Diagnosis not present

## 2018-09-13 DIAGNOSIS — Z9981 Dependence on supplemental oxygen: Secondary | ICD-10-CM | POA: Diagnosis not present

## 2018-09-13 DIAGNOSIS — I2510 Atherosclerotic heart disease of native coronary artery without angina pectoris: Secondary | ICD-10-CM | POA: Diagnosis not present

## 2018-09-13 DIAGNOSIS — M810 Age-related osteoporosis without current pathological fracture: Secondary | ICD-10-CM | POA: Diagnosis not present

## 2018-09-13 DIAGNOSIS — Z955 Presence of coronary angioplasty implant and graft: Secondary | ICD-10-CM | POA: Diagnosis not present

## 2018-09-13 DIAGNOSIS — I5033 Acute on chronic diastolic (congestive) heart failure: Secondary | ICD-10-CM | POA: Diagnosis not present

## 2018-09-13 DIAGNOSIS — F17210 Nicotine dependence, cigarettes, uncomplicated: Secondary | ICD-10-CM | POA: Diagnosis not present

## 2018-09-13 DIAGNOSIS — E785 Hyperlipidemia, unspecified: Secondary | ICD-10-CM | POA: Diagnosis not present

## 2018-09-13 MED ORDER — ATORVASTATIN CALCIUM 40 MG PO TABS: 40.0000 mg | ORAL_TABLET | Freq: Every day | ORAL | 3 refills | Status: DC

## 2018-09-13 MED ORDER — METOPROLOL TARTRATE 25 MG PO TABS: 12.5000 mg | ORAL_TABLET | Freq: Two times a day (BID) | ORAL | 0 refills | Status: DC

## 2018-09-13 MED ORDER — ALBUTEROL SULFATE (2.5 MG/3ML) 0.083% IN NEBU: INHALATION_SOLUTION | RESPIRATORY_TRACT | 0 refills | Status: DC

## 2018-09-13 NOTE — Telephone Encounter (Signed)
Received call from Neoma Laming [facility] PT with [facility]. [phone]~ telephone.   Requested VO to extend [facility] PT 2x weekly x3 weeks for balance, gait and strengthening. VO given.

## 2018-09-13 NOTE — Patient Outreach (Signed)
[facility] [facility]) Care Management   09/13/2018  Cindy [name] November 04, 1956 [id number]  Cindy [name] is an 61 y.o. female  Subjective:  RNCM in for home visit and initial assessment. Ms. Elderkin was alert and oriented x3. O2 at 3 L via nasal cannula. No complaints of chest discomfort or shortness of breath.   Objective:  BP 126/80 (BP Location: Right Arm, Patient Position: Sitting, Cuff Size: Normal)   Pulse 98 Comment: Heart rate ranged from 113-135 when ambulating to restroom.  Resp 20   SpO2 96%    Review of Systems  Constitutional: Negative.   HENT: Negative.   Eyes: Negative.   Respiratory: Positive for cough, sputum production and wheezing.   Cardiovascular: Positive for leg swelling.  Gastrointestinal: Negative.   Genitourinary: Negative.   Musculoskeletal:       Patient reported episodes of pain related to chronic joint pain.  Skin: Positive for rash.       Reported shingles rash to buttocks.  Neurological: Negative.     Physical Exam  Constitutional: She is oriented to person, place, and time. She appears well-developed.  Cardiovascular: Normal rate.  Elevated at 135 when ambulating.  Respiratory: Effort normal.  GI: Soft. Bowel sounds are normal.  Musculoskeletal: She exhibits edema.  Non pitting edema to both lower extremities.  Neurological: She is alert and oriented to person, place, and time.  Skin: Skin is warm and dry.  Psychiatric: She has a normal mood and affect. Her behavior is normal. Judgment and thought content normal.    Encounter Medications:   Outpatient Encounter Medications as of 09/13/2018  Medication Sig Note  . albuterol (PROVENTIL HFA;VENTOLIN HFA) 108 (90 Base) MCG/ACT inhaler Inhale 2 puffs into the lungs every 6 (six) hours as needed for wheezing or shortness of breath.   Marland Kitchen albuterol (PROVENTIL) (2.5 MG/3ML) 0.083% nebulizer solution INHALE 1 VIAL VIA NEBULIZER EVERY 6 HOURS AS NEEDED.   Marland Kitchen alendronate (FOSAMAX) 70 MG tablet  Take 1 tablet (70 mg total) by mouth every Wednesday. TAKE 1 TABLET BY MOUTH EVERY WEEK ON AN EMPTY STOMACH.   Marland Kitchen aspirin EC 81 MG tablet Take 81 mg by mouth every morning.    Marland Kitchen atorvastatin (LIPITOR) 40 MG tablet Take 1 tablet (40 mg total) by mouth daily.   . diphenhydrAMINE (BENADRYL) 12.5 MG/5ML liquid Take 6.25 mg by mouth 4 (four) times daily as needed.   . furosemide (LASIX) 40 MG tablet Take 1 tablet (40 mg total) by mouth daily. (Patient taking differently: Take 40-80 mg by mouth See admin instructions. Alternate taking 80mg  one day, then take 40mg  the next day, then repeat.)   . metoprolol tartrate (LOPRESSOR) 25 MG tablet Take 0.5 tablets (12.5 mg total) by mouth 2 (two) times daily.   . nitroGLYCERIN (NITROSTAT) 0.4 MG SL tablet Place 1 tablet (0.4 mg total) under the tongue every 5 (five) minutes as needed for chest pain. If no improvement with 3 doses call 911   . nystatin (MYCOSTATIN) 100000 UNIT/ML suspension Use as directed 5 mLs (500,000 Units total) in the mouth or throat 4 (four) times daily.   Marland Kitchen oxyCODONE-acetaminophen (PERCOCET/ROXICET) 5-325 MG tablet Take 1 tablet by mouth every 4 (four) hours as needed for severe pain.   . OXYGEN Inhale 3 L into the lungs continuous.    . pantoprazole (PROTONIX) 40 MG tablet Take 1 tablet (40 mg total) by mouth 2 (two) times daily. 09/13/2018: Patient reported taking one a day.  . polyethylene glycol (MIRALAX /  GLYCOLAX) packet Take 17 g by mouth daily.   . predniSONE (DELTASONE) 10 MG tablet Take 1-4 tablets (10-40 mg total) by mouth daily with breakfast. Take 40mg  daily for 2days then 20mg  daily for 2days then go back to Basal dose of 10mg  daily (Patient taking differently: Take 10 mg by mouth daily with breakfast. Basal dose of 10mg  daily)   . Tiotropium Bromide Monohydrate (SPIRIVA RESPIMAT) 2.5 MCG/ACT AERS INHALE 2 PUFFS INTO THE LUNGS ONCE DAILY (Patient taking differently: Inhale 2 puffs into the lungs at bedtime. )   .  budesonide-formoterol (SYMBICORT) 160-4.5 MCG/ACT inhaler Inhale 2 puffs into the lungs 2 (two) times daily.   . Calcium Carb-Cholecalciferol 600-800 MG-UNIT TABS Take 1 tablet by mouth 2 (two) times daily.    . clopidogrel (PLAVIX) 75 MG tablet Take 1 tablet (75 mg total) by mouth daily.   . cyclobenzaprine (FLEXERIL) 10 MG tablet TAKE 1 TABLET BY MOUTH 3 TIMES A DAY AS NEEDED FOR MUSCLE SPASMS.   . cyclobenzaprine (FLEXERIL) 10 MG tablet TAKE 1 TABLET BY MOUTH 3 TIMES A DAY AS NEEDED FOR MUSCLE SPASMS. (Patient not taking: Reported on 09/13/2018) 09/13/2018: PRN.   No facility-administered encounter medications on file as of 09/13/2018.     Functional Status:   In your present state of health, do you have any difficulty performing the following activities: 09/13/2018 08/10/2018  Hearing? N N  Vision? N N  Difficulty concentrating or making decisions? N N  Walking or climbing stairs? Y Y  Comment Due to COPD. -  Dressing or bathing? Y N  Comment Requires occassional assistance from caregiver. -  Doing errands, shopping? Y N  Some recent data might be hidden    Fall/Depression Screening:    Fall Risk  09/13/2018 07/09/2018 01/29/2018  Falls in the past year? - No No  Risk for fall due to : Other (Comment) - -   PHQ 2/9 Scores 09/13/2018 07/09/2018 01/29/2018 11/18/2017 10/13/2017 04/07/2017 09/25/2015  PHQ - 2 Score 0 0 0 0 0 2 0  PHQ- 9 Score - - - - 7 7 -    Assessment:   Initial home visit complete. Ms. Thelander reported episodes of shortness of breath with exertion and discomfort to lower back when ambulating. Denied falls. She reported compliance with medications and low sodium diet but does not perform daily weights and smokes several cigarettes a day. She verbalized awareness of safety risk and reported smoking outside of the home. She did not want to discuss action plans or options for smoking cessation. Reported her primary concern was obtaining a bedside commode and adjustable bed. Stated  she was given specific information about the bed requirements but was unable to locate the info or recall which provider made the recommendations. She is pending follow up with Home Health therapist. Will determine if supplies are being ordered by the agency.  THN CM Care Plan Problem One     Most Recent Value  Care Plan Problem One  High Risk for Readmission.  Role Documenting the Problem One  Care Management Coordinator  Care Plan for Problem One  Active  THN Long Term Goal   Over the next 90 days patient will not have a hospitalization related to chronic disease complications.  THN Long Term Goal Start Date  09/13/18  Interventions for Problem One Long Term Goal  Discussed medications, importance of daily weights and smoking cessation. Reviewed pending appointments and discussed importance of attending appointments as scheduled. [Attempted to review action plan  zones/member unreceptive.]  THN CM Short Term Goal #1   Over the next 30 days patient will take medications as prescribed.  THN CM Short Term Goal #1 Start Date  09/13/18  Interventions for Short Term Goal #1  Discussed medications and indications for use. Reviewed pending refills.  [Patient denied concerns with medication affordability.]  THN CM Short Term Goal #2   Over the next 30 days patient will weigh daily and maintain log.  THN CM Short Term Goal #2 Start Date  09/13/18  Interventions for Short Term Goal #2  Educated patient on importance of weighing daily and monitoring overnigh weight gain. Patient reported having a scale in the home but prefers not to weigh.  THN CM Short Term Goal #3  Over the next 30 day patient will engage in smoking cessation counseling or verbalize plan to stop smoking.  THN CM Short Term Goal #3 Start Date  09/13/18  Interventions for Short Tern Goal #3  Patient smokes daily but did not want to discuss plan for smoking cessation.     PLAN Will follow up with next week.  [place] [phone]

## 2018-09-14 ENCOUNTER — Other Ambulatory Visit: Payer: Self-pay | Admitting: Family Medicine

## 2018-09-14 ENCOUNTER — Other Ambulatory Visit: Payer: Self-pay | Admitting: *Deleted

## 2018-09-14 LAB — CBC WITH DIFFERENTIAL/PLATELET
BASOS ABS: 97 {cells}/uL (ref 0–200)
Basophils Relative: 0.6 %
Eosinophils Absolute: 32 cells/uL (ref 15–500)
Eosinophils Relative: 0.2 %
HEMATOCRIT: 37.7 % (ref 35.0–45.0)
HEMOGLOBIN: 12.1 g/dL (ref 11.7–15.5)
LYMPHS ABS: 3807 {cells}/uL (ref 850–3900)
MCH: 30.4 pg (ref 27.0–33.0)
MCHC: 32.1 g/dL (ref 32.0–36.0)
MCV: 94.7 fL (ref 80.0–100.0)
MPV: 11.5 fL (ref 7.5–12.5)
Monocytes Relative: 9.8 %
NEUTROS PCT: 65.9 %
Neutro Abs: 10676 cells/uL — ABNORMAL HIGH (ref 1500–7800)
Platelets: 315 10*3/uL (ref 140–400)
RBC: 3.98 10*6/uL (ref 3.80–5.10)
RDW: 13.5 % (ref 11.0–15.0)
Total Lymphocyte: 23.5 %
WBC: 16.2 10*3/uL — ABNORMAL HIGH (ref 3.8–10.8)
WBCMIX: 1588 {cells}/uL — AB (ref 200–950)

## 2018-09-14 LAB — COMPLETE METABOLIC PANEL WITH GFR
AG RATIO: 2 (calc) (ref 1.0–2.5)
ALT: 37 U/L — ABNORMAL HIGH (ref 6–29)
AST: 25 U/L (ref 10–35)
Albumin: 3.9 g/dL (ref 3.6–5.1)
Alkaline phosphatase (APISO): 81 U/L (ref 33–130)
BUN: 12 mg/dL (ref 7–25)
CO2: 40 mmol/L — ABNORMAL HIGH (ref 20–32)
Calcium: 9.6 mg/dL (ref 8.6–10.4)
Chloride: 89 mmol/L — ABNORMAL LOW (ref 98–110)
Creat: 0.77 mg/dL (ref 0.50–0.99)
GFR, EST AFRICAN AMERICAN: 96 mL/min/{1.73_m2} (ref >=60)
GFR, EST NON AFRICAN AMERICAN: 83 mL/min/{1.73_m2} (ref >=60)
GLOBULIN: 2 g/dL (ref 1.9–3.7)
Glucose, Bld: 197 mg/dL — ABNORMAL HIGH (ref 65–99)
POTASSIUM: 3 mmol/L — AB (ref 3.5–5.3)
Sodium: 139 mmol/L (ref 135–146)
TOTAL PROTEIN: 5.9 g/dL — AB (ref 6.1–8.1)
Total Bilirubin: 0.4 mg/dL (ref 0.2–1.2)

## 2018-09-14 MED ORDER — POTASSIUM CHLORIDE CRYS ER 20 MEQ PO TBCR: 20.0000 meq | EXTENDED_RELEASE_TABLET | Freq: Every day | ORAL | 0 refills | Status: DC

## 2018-09-15 DIAGNOSIS — J9621 Acute and chronic respiratory failure with hypoxia: Secondary | ICD-10-CM | POA: Diagnosis not present

## 2018-09-15 DIAGNOSIS — J441 Chronic obstructive pulmonary disease with (acute) exacerbation: Secondary | ICD-10-CM | POA: Diagnosis not present

## 2018-09-15 DIAGNOSIS — I110 Hypertensive heart disease with heart failure: Secondary | ICD-10-CM | POA: Diagnosis not present

## 2018-09-15 DIAGNOSIS — I2510 Atherosclerotic heart disease of native coronary artery without angina pectoris: Secondary | ICD-10-CM | POA: Diagnosis not present

## 2018-09-15 DIAGNOSIS — I5033 Acute on chronic diastolic (congestive) heart failure: Secondary | ICD-10-CM | POA: Diagnosis not present

## 2018-09-15 DIAGNOSIS — I214 Non-ST elevation (NSTEMI) myocardial infarction: Secondary | ICD-10-CM | POA: Diagnosis not present

## 2018-09-20 ENCOUNTER — Encounter: Payer: Self-pay | Admitting: Emergency Medicine

## 2018-09-20 ENCOUNTER — Ambulatory Visit (INDEPENDENT_AMBULATORY_CARE_PROVIDER_SITE_OTHER): Payer: Medicare Other | Admitting: Emergency Medicine

## 2018-09-20 DIAGNOSIS — J9611 Chronic respiratory failure with hypoxia: Secondary | ICD-10-CM | POA: Diagnosis not present

## 2018-09-20 DIAGNOSIS — I2510 Atherosclerotic heart disease of native coronary artery without angina pectoris: Secondary | ICD-10-CM

## 2018-09-20 DIAGNOSIS — J411 Mucopurulent chronic bronchitis: Secondary | ICD-10-CM

## 2018-09-20 NOTE — Progress Notes (Signed)
Subjective:    Patient ID: Cindy [name], female    DOB: 1956/08/29, 62 y.o.   MRN: [id number]  HPI  ROV 04/29/18 --62 year old smoker with a history of severe COPD, chronic cough with dyspnea.  She has chronic hypoxemic respiratory failure and is prednisone dependent.  She has frequent exacerbations that are mostly bronchitic in nature.  She was treated for an acute exacerbation 4/8 by her primary care office.  She returns today reporting that she is having LE swelling, B calf pain, increased dyspnea and chest tightness. She is wearing O2 at 3L/min. Uses Spiriva, Symbicort, pred 10mg  daily. We tried to start daliresp but it was not covered by her insurance. She is having breakthrough GERD, especially at night, on protonix.  She continues to smoke 1 pack a day  ROV 06/22/18 --62 year old active smoker with severe COPD, chronic cough with mucopurulent bronchitis and frequent exacerbations.  She also has chronic hypoxemic respiratory failure for which she uses oxygen at 3 L/min. She has been dealing with compression fx in her back, back pain. She is on pred 20mg  daily since our last visit 04/29/18.  She is on symbicort and spiriva, uses albuterol nebs 2-3x a day. Still smoking about 5-20 cig a day depending on how her breathing feels. She is having more edema in her LE and her face  ROV 09/20/18 --Cindy [name] follows up today for her severe COPD, chronic mucopurulent bronchitis with frequent exacerbations and fairly unrelenting cough.  She has chronic hypoxemic respiratory failure.  She continues to smoke which is certainly a contributor.  I have tried to manage her with scheduled bronchodilators, added prednisone, currently 10 mg daily. Since I last saw her she was admitted with CP, had NSTEMI and underwent PTCI. EF found to be 40%. Her breathing may be slightly better. She was treated for another AE-COPD with steroid taper beginning of this month. She is currently smoking 10-15 cig a day. Currently managed on  Spiriva and Symbicort. She is wearing O2 at 3L/min. She is being evaluated for chronic back pain, has been referred to see Dr Ivan Croft with [facility].    PULMONARY FUNCTON TEST 09/27/2012  FVC 2.31  FEV1 .87  FEV1/FVC 37.7  FVC  % Predicted 76  FEV % Predicted 38  FeF 25-75 .3  FeF 25-75 % Predicted 2.65       Objective:   Physical Exam Vitals:   09/20/18 1131  BP: 140/80  Pulse: (!) 108  SpO2: 98%  Weight: 188 lb 3.2 oz (85.4 kg)  Height: 5' 1.5" (1.562 m)   Gen: Pleasant, Obese woman, in no distress,   ENT: No lesions,  mouth clear,  oropharynx clear, no postnasal drip, strong voice, some increasd facial edema  Neck: No JVD, no Stridor  Lungs: No use of accessory muscles, low pitched B exp wheezes  Cardiovascular: RRR, heart sounds normal, no murmur or gallops, 1+ lower ext peripheral edema increased on the R  Musculoskeletal: No deformities, no cyanosis or clubbing  Neuro: alert, non focal  Skin: Warm, no lesions or rashes     Assessment & Plan:  COPD (chronic obstructive pulmonary disease) ([facility]) Please continue Spiriva, Symbicort as you have been taking them. Continue albuterol 2 puffs up to every 4 hours if needed for shortness of breath, wheezing, chest tightness. Please continue prednisone 10 mg every day. You need to work hard on decreasing your smoking.  This is contributing to both your lung and your heart disease. You  would be at high risk for any procedure to address back pain.  Follow with Dr Lamonte Sakai in 4 months or sooner if you have any problems.  Chronic hypoxemic respiratory failure ([place]) Please continue your oxygen at 3 L/min  Baltazar Apo, MD, PhD 09/20/2018, 12:02 PM [facility] [phone] or if no answer [phone]

## 2018-09-20 NOTE — Assessment & Plan Note (Signed)
Please continue your oxygen at 3 L/min

## 2018-09-20 NOTE — Patient Instructions (Addendum)
Please continue Spiriva, Symbicort as you have been taking them. Continue albuterol 2 puffs up to every 4 hours if needed for shortness of breath, wheezing, chest tightness. Please continue prednisone 10 mg every day. Please continue your oxygen at 3 L/min You need to work hard on decreasing your smoking.  This is contributing to both your lung and your heart disease. You would be at high risk for any procedure to address back pain.  Follow with Dr Lamonte Sakai in 4 months or sooner if you have any problems.

## 2018-09-20 NOTE — Assessment & Plan Note (Signed)
Please continue Spiriva, Symbicort as you have been taking them. Continue albuterol 2 puffs up to every 4 hours if needed for shortness of breath, wheezing, chest tightness. Please continue prednisone 10 mg every day. You need to work hard on decreasing your smoking.  This is contributing to both your lung and your heart disease. You would be at high risk for any procedure to address back pain.  Follow with Dr Lamonte Sakai in 4 months or sooner if you have any problems.

## 2018-09-21 ENCOUNTER — Other Ambulatory Visit: Payer: Medicare Other

## 2018-09-21 ENCOUNTER — Other Ambulatory Visit: Payer: Self-pay | Admitting: Family Medicine

## 2018-09-21 DIAGNOSIS — E876 Hypokalemia: Secondary | ICD-10-CM | POA: Diagnosis not present

## 2018-09-21 DIAGNOSIS — I2510 Atherosclerotic heart disease of native coronary artery without angina pectoris: Secondary | ICD-10-CM | POA: Diagnosis not present

## 2018-09-21 DIAGNOSIS — I214 Non-ST elevation (NSTEMI) myocardial infarction: Secondary | ICD-10-CM | POA: Diagnosis not present

## 2018-09-21 DIAGNOSIS — J9621 Acute and chronic respiratory failure with hypoxia: Secondary | ICD-10-CM | POA: Diagnosis not present

## 2018-09-21 DIAGNOSIS — J441 Chronic obstructive pulmonary disease with (acute) exacerbation: Secondary | ICD-10-CM | POA: Diagnosis not present

## 2018-09-21 DIAGNOSIS — I5033 Acute on chronic diastolic (congestive) heart failure: Secondary | ICD-10-CM | POA: Diagnosis not present

## 2018-09-21 DIAGNOSIS — I110 Hypertensive heart disease with heart failure: Secondary | ICD-10-CM | POA: Diagnosis not present

## 2018-09-21 NOTE — Telephone Encounter (Signed)
Requesting refill    Flexeril  LOV: 07/09/18  LRF:  08/27/18

## 2018-09-22 ENCOUNTER — Other Ambulatory Visit: Payer: Self-pay | Admitting: Family Medicine

## 2018-09-22 DIAGNOSIS — E876 Hypokalemia: Secondary | ICD-10-CM

## 2018-09-22 LAB — COMPREHENSIVE METABOLIC PANEL
AG RATIO: 1.7 (calc) (ref 1.0–2.5)
ALBUMIN MSPROF: 3.8 g/dL (ref 3.6–5.1)
ALT: 37 U/L — ABNORMAL HIGH (ref 6–29)
AST: 26 U/L (ref 10–35)
Alkaline phosphatase (APISO): 80 U/L (ref 33–130)
BUN: 9 mg/dL (ref 7–25)
CHLORIDE: 90 mmol/L — AB (ref 98–110)
CO2: 36 mmol/L — AB (ref 20–32)
CREATININE: 0.85 mg/dL (ref 0.50–0.99)
Calcium: 9.1 mg/dL (ref 8.6–10.4)
GLOBULIN: 2.2 g/dL (ref 1.9–3.7)
Glucose, Bld: 203 mg/dL — ABNORMAL HIGH (ref 65–99)
POTASSIUM: 3.3 mmol/L — AB (ref 3.5–5.3)
SODIUM: 138 mmol/L (ref 135–146)
Total Bilirubin: 0.3 mg/dL (ref 0.2–1.2)
Total Protein: 6 g/dL — ABNORMAL LOW (ref 6.1–8.1)

## 2018-09-23 ENCOUNTER — Telehealth: Payer: Self-pay | Admitting: Family Medicine

## 2018-09-23 DIAGNOSIS — I2510 Atherosclerotic heart disease of native coronary artery without angina pectoris: Secondary | ICD-10-CM | POA: Diagnosis not present

## 2018-09-23 DIAGNOSIS — I110 Hypertensive heart disease with heart failure: Secondary | ICD-10-CM | POA: Diagnosis not present

## 2018-09-23 DIAGNOSIS — I214 Non-ST elevation (NSTEMI) myocardial infarction: Secondary | ICD-10-CM | POA: Diagnosis not present

## 2018-09-23 DIAGNOSIS — J9621 Acute and chronic respiratory failure with hypoxia: Secondary | ICD-10-CM | POA: Diagnosis not present

## 2018-09-23 DIAGNOSIS — I5033 Acute on chronic diastolic (congestive) heart failure: Secondary | ICD-10-CM | POA: Diagnosis not present

## 2018-09-23 DIAGNOSIS — J441 Chronic obstructive pulmonary disease with (acute) exacerbation: Secondary | ICD-10-CM | POA: Diagnosis not present

## 2018-09-23 MED ORDER — POTASSIUM CHLORIDE 20 MEQ/15ML (10%) PO SOLN: 20.0000 meq | Freq: Every day | ORAL | 0 refills | Status: DC

## 2018-09-23 NOTE — Telephone Encounter (Signed)
Patient is calling to see if she can get her potassium in liquid form  [phone] please call and advise

## 2018-09-23 NOTE — Telephone Encounter (Signed)
Prescription sent to pharmacy.

## 2018-09-24 ENCOUNTER — Other Ambulatory Visit: Payer: Self-pay

## 2018-09-24 NOTE — Patient Outreach (Signed)
[place]) Care Management  09/24/2018  MADGIE DHALIWAL 1956/01/21 [id number]    CALL BACK  Ms. Capili left a voice message acknowledging receipt of outreach call. RNCM will follow up on next week.  [place] [phone]

## 2018-09-24 NOTE — Progress Notes (Signed)
Repeat CBC entered in error, not needed

## 2018-09-24 NOTE — Patient Outreach (Signed)
[facility]) Care Management  09/24/2018  TASHIANA LAMARCA 23-Sep-1956 [id number]   Follow up outreach call placed to Ms. [place]. No answer at listed number. Left HIPAA compliant voice message requesting a return call.  PLAN Will follow up in 3-4 business days.   [place] [phone]

## 2018-09-24 NOTE — Patient Outreach (Signed)
[facility]) Care Management  09/08/2018  Cindy [name] March 14, 1956 [id number]   Cindy [name] contacted [facility] prior to scheduled appointment. Member not available for initial assessment or home visit. She denied urgent concerns but requested that [facility] contact MD regarding medication orders for metoprolol and atorvastatin. Member reported having less than two weeks of the medications.  PLAN Will contact MD office regarding medications and follow up with member this week.   [place] [phone]

## 2018-09-27 DIAGNOSIS — I2510 Atherosclerotic heart disease of native coronary artery without angina pectoris: Secondary | ICD-10-CM | POA: Diagnosis not present

## 2018-09-27 DIAGNOSIS — J9621 Acute and chronic respiratory failure with hypoxia: Secondary | ICD-10-CM | POA: Diagnosis not present

## 2018-09-27 DIAGNOSIS — I110 Hypertensive heart disease with heart failure: Secondary | ICD-10-CM | POA: Diagnosis not present

## 2018-09-27 DIAGNOSIS — I214 Non-ST elevation (NSTEMI) myocardial infarction: Secondary | ICD-10-CM | POA: Diagnosis not present

## 2018-09-27 DIAGNOSIS — I5033 Acute on chronic diastolic (congestive) heart failure: Secondary | ICD-10-CM | POA: Diagnosis not present

## 2018-09-27 DIAGNOSIS — J441 Chronic obstructive pulmonary disease with (acute) exacerbation: Secondary | ICD-10-CM | POA: Diagnosis not present

## 2018-09-29 ENCOUNTER — Other Ambulatory Visit: Payer: Medicare Other

## 2018-09-29 ENCOUNTER — Ambulatory Visit: Payer: Self-pay

## 2018-09-29 DIAGNOSIS — E876 Hypokalemia: Secondary | ICD-10-CM | POA: Diagnosis not present

## 2018-09-29 LAB — BASIC METABOLIC PANEL
BUN: 8 mg/dL (ref 7–25)
CHLORIDE: 95 mmol/L — AB (ref 98–110)
CO2: 33 mmol/L — ABNORMAL HIGH (ref 20–32)
Calcium: 9.5 mg/dL (ref 8.6–10.4)
Creat: 0.91 mg/dL (ref 0.50–0.99)
Glucose, Bld: 161 mg/dL — ABNORMAL HIGH (ref 65–99)
Potassium: 4.9 mmol/L (ref 3.5–5.3)
Sodium: 140 mmol/L (ref 135–146)

## 2018-09-30 ENCOUNTER — Other Ambulatory Visit: Payer: Self-pay | Admitting: *Deleted

## 2018-09-30 DIAGNOSIS — E876 Hypokalemia: Secondary | ICD-10-CM

## 2018-09-30 DIAGNOSIS — I5033 Acute on chronic diastolic (congestive) heart failure: Secondary | ICD-10-CM | POA: Diagnosis not present

## 2018-09-30 DIAGNOSIS — I214 Non-ST elevation (NSTEMI) myocardial infarction: Secondary | ICD-10-CM | POA: Diagnosis not present

## 2018-09-30 DIAGNOSIS — J441 Chronic obstructive pulmonary disease with (acute) exacerbation: Secondary | ICD-10-CM | POA: Diagnosis not present

## 2018-09-30 DIAGNOSIS — I2510 Atherosclerotic heart disease of native coronary artery without angina pectoris: Secondary | ICD-10-CM | POA: Diagnosis not present

## 2018-09-30 DIAGNOSIS — J9621 Acute and chronic respiratory failure with hypoxia: Secondary | ICD-10-CM | POA: Diagnosis not present

## 2018-09-30 DIAGNOSIS — I110 Hypertensive heart disease with heart failure: Secondary | ICD-10-CM | POA: Diagnosis not present

## 2018-10-01 ENCOUNTER — Other Ambulatory Visit: Payer: Self-pay

## 2018-10-01 ENCOUNTER — Ambulatory Visit: Payer: Self-pay

## 2018-10-01 DIAGNOSIS — M549 Dorsalgia, unspecified: Secondary | ICD-10-CM | POA: Diagnosis not present

## 2018-10-01 DIAGNOSIS — M7918 Myalgia, other site: Secondary | ICD-10-CM | POA: Diagnosis not present

## 2018-10-01 DIAGNOSIS — M8008XA Age-related osteoporosis with current pathological fracture, vertebra(e), initial encounter for fracture: Secondary | ICD-10-CM | POA: Diagnosis not present

## 2018-10-01 DIAGNOSIS — M47816 Spondylosis without myelopathy or radiculopathy, lumbar region: Secondary | ICD-10-CM | POA: Diagnosis not present

## 2018-10-01 NOTE — Patient Outreach (Signed)
[place] [facility]) Care Management  10/01/2018  Cindy [name] July 10, 1956 [id number]   Successful outreach with Ms. Hiegel. She denied complaints of shortness of breath. Denied changes or worsening symptoms since home visit. Reported compliance with medications but remains noncompliant with daily weights and smoking cessation. Primary concern was obtaining a bedside commode. Home Health therapist offered to order the equipment, but Ms. Gargus requested that order not be placed due to possible $16 copayment.  Her caregiver Louie Casa will follow up regarding options for free equipment in the area. Stated that she will notify [facility] or [place] therapist if order is needed.   PLAN Will continue routine outreach.  [place] [phone]

## 2018-10-02 ENCOUNTER — Other Ambulatory Visit: Payer: Self-pay | Admitting: Family Medicine

## 2018-10-05 DIAGNOSIS — I2510 Atherosclerotic heart disease of native coronary artery without angina pectoris: Secondary | ICD-10-CM | POA: Insufficient documentation

## 2018-10-05 NOTE — Progress Notes (Signed)
Cardiology Office Note    Date:  10/11/2018   ID:  [name], DOB 22-Oct-1956, MRN [id number]  PCP:  Alycia Rossetti, MD  Cardiologist: Kate Sable, MD EPS: None  No chief complaint on file.   History of Present Illness:  Cindy [name] is a 62 y.o. female with history of COPD on home O2, hypertension, HLD, chronic pain, tobacco abuse.  She has CAD status post NSTEMI 08/11/2018.  2D echo LVEF 55% with wall motion abnormality of the mid apical anterior septal myocardium and moderate aortic stenosis to mild stenosis.  Cardiac cath 8/19 two-vessel CAD with 90% LAD and 60 to 70% OM 2, LVEF 45 to 50%.  LVEDP 25-30.  She was unable to lie flat so she was diuresed and then taken back to the Cath Lab 08/19/2018 and underwent complex but successful PCI to the ostial and proximal LAD with orbital atherectomy with IVUS overlapping stents.  Was complicated by abrupt closure of the proximal LAD during arthrectomy which resolved following stent placement.  Limited echo 08/20/2018 LVEF 55 to 60% with improvement in wall motion.  Patient had extensive upper extremity ecchymosis on Brilinta and aspirin.  Brilinta was switched to Plavix.  ER 08/23/2018 for leg pain no evidence of DVT felt to be radicular pain.  Elevated WBC secondary to steroid use.  Patient comes in today for follow-up accompanied by her caregiver.  She complains of a rash on her neck and thinks is from 1 of her medications.  She denies any chest pain, dizziness or presyncope.  She says her heart rate goes up with any activity because of her COPD.  She also has some lower extremity edema.  She eats out a lot and has a biscuit every morning.  Also eating canned foods.    Past Medical History:  Diagnosis Date  . 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 Arthritis   . CAD (coronary artery disease)    a. NSTEMI 07/2018 s/p difficult PCI with overlapping DES to LAD, residual OM2 disease treated medically.  . Chronic diastolic CHF (congestive  heart failure) ([place])   . Chronic pain   . Chronic respiratory failure ([place]) On home 02 2-3L  . Compression fracture of lumbar vertebra ([place])   . COPD (chronic obstructive pulmonary disease) ([place])   . Essential hypertension   . Hyperglycemia, drug-induced   . Hyperlipidemia   . Kidney stones 2014   Left side, multiple  . Leukocytosis   . Osteoporosis 2013  . Thyroid disease    pt denies  . Tobacco abuse     Past Surgical History:  Procedure Laterality Date  . CERVICAL BIOPSY    . COLONOSCOPY N/A 03/31/2013   Procedure: COLONOSCOPY;  Surgeon: Rogene Houston, MD;  Location[facility];  Service: Endoscopy;  Laterality: N/A;  730  . CORONARY ATHERECTOMY N/A 08/19/2018   Procedure: CORONARY ATHERECTOMY;  Surgeon: Nelva Bush, MD;  Location: [facility];  Service: Cardiovascular;  Laterality: N/A;  . CORONARY STENT INTERVENTION N/A 08/19/2018   Procedure: CORONARY STENT INTERVENTION;  Surgeon: Nelva Bush, MD;  Location: [facility];  Service: Cardiovascular;  Laterality: N/A;  . INTRAVASCULAR ULTRASOUND/IVUS N/A 08/19/2018   Procedure: Intravascular Ultrasound/IVUS;  Surgeon: Nelva Bush, MD;  Location: [place] [facility];  Service: Cardiovascular;  Laterality: N/A;  . LEFT HEART CATH N/A 08/19/2018   Procedure: Left Heart Cath;  Surgeon: Nelva Bush, MD;  Location: [facility];  Service: Cardiovascular;  Laterality: N/A;  . LEFT HEART CATH AND CORONARY ANGIOGRAPHY N/A 08/16/2018   Procedure: LEFT HEART CATH AND CORONARY ANGIOGRAPHY;  Surgeon: Nelva Bush, MD;  Location: [facility];  Service: Cardiovascular;  Laterality: N/A;  . TUBAL LIGATION      Current Medications: Current Meds  Medication Sig  . albuterol (PROVENTIL HFA;VENTOLIN HFA) 108 (90 Base) MCG/ACT inhaler Inhale 2 puffs into the lungs every 6 (six) hours as needed for wheezing or shortness of breath.  Marland Kitchen albuterol (PROVENTIL) (2.5 MG/3ML) 0.083% nebulizer solution INHALE  1 VIAL VIA NEBULIZER EVERY 6 HOURS AS NEEDED.  Marland Kitchen alendronate (FOSAMAX) 70 MG tablet Take 1 tablet (70 mg total) by mouth every Wednesday. TAKE 1 TABLET BY MOUTH EVERY WEEK ON AN EMPTY STOMACH.  Marland Kitchen aspirin EC 81 MG tablet Take 81 mg by mouth every morning.   Marland Kitchen atorvastatin (LIPITOR) 40 MG tablet Take 1 tablet (40 mg total) by mouth daily.  . budesonide-formoterol (SYMBICORT) 160-4.5 MCG/ACT inhaler Inhale 2 puffs into the lungs 2 (two) times daily.  . Calcium Carb-Cholecalciferol 600-800 MG-UNIT TABS Take 1 tablet by mouth 2 (two) times daily.   . clopidogrel (PLAVIX) 75 MG tablet Take 1 tablet (75 mg total) by mouth daily.  . cyclobenzaprine (FLEXERIL) 10 MG tablet TAKE 1 TABLET BY MOUTH 3 TIMES A DAY AS NEEDED FOR MUSCLE SPASMS.  Marland Kitchen dextromethorphan-guaiFENesin (MUCINEX DM) 30-600 MG 12hr tablet Take 1 tablet by mouth 2 (two) times daily.  . diphenhydrAMINE (BENADRYL) 12.5 MG/5ML liquid Take 6.25 mg by mouth 4 (four) times daily as needed.  . furosemide (LASIX) 40 MG tablet Take 1 tablet (40 mg total) by mouth daily. (Patient taking differently: Take 40-80 mg by mouth See admin instructions. Alternate taking 80mg  one day, then take 40mg  the next day, then repeat.)  . metoprolol tartrate (LOPRESSOR) 25 MG tablet Take 0.5 tablets (12.5 mg total) by mouth 2 (two) times daily.  . nitroGLYCERIN (NITROSTAT) 0.4 MG SL tablet Place 1 tablet (0.4 mg total) under the tongue every 5 (five) minutes as needed for chest pain. If no improvement with 3 doses call 911  . nystatin (MYCOSTATIN) 100000 UNIT/ML suspension TAKE 5 ML BY MOUTH FOUR TIMES A DAY AS DIRECTED.  Marland Kitchen oxyCODONE-acetaminophen (PERCOCET/ROXICET) 5-325 MG tablet Take 1 tablet by mouth every 4 (four) hours as needed for severe pain.  . OXYGEN Inhale 3 L into the lungs continuous.   . pantoprazole (PROTONIX) 40 MG tablet Take 1 tablet (40 mg total) by mouth 2 (two) times daily.  . polyethylene glycol (MIRALAX / GLYCOLAX) packet Take 17 g by mouth daily.    . potassium chloride 20 MEQ/15ML (10%) SOLN Take 15 mLs (20 mEq total) by mouth daily.  . predniSONE (DELTASONE) 10 MG tablet Take 1-4 tablets (10-40 mg total) by mouth daily with breakfast. Take 40mg  daily for 2days then 20mg  daily for 2days then go back to Basal dose of 10mg  daily (Patient taking differently: Take 10 mg by mouth daily with breakfast. Basal dose of 10mg  daily)  . Tiotropium Bromide Monohydrate (SPIRIVA RESPIMAT) 2.5 MCG/ACT AERS INHALE 2 PUFFS INTO THE LUNGS ONCE DAILY (Patient taking differently: Inhale 2 puffs into the lungs at bedtime. )  . [DISCONTINUED] metoprolol tartrate (LOPRESSOR) 25 MG tablet Take 0.5 tablets (12.5 mg total) by mouth 2 (two) times daily.     Allergies:   Keflex [cephalexin]; Doxycycline; Sulfa antibiotics; Avelox [moxifloxacin hcl in nacl]; and Toradol [ketorolac tromethamine]   Social History   Socioeconomic History  .  Marital status: Divorced    Spouse name: Not on file  . Number of children: 3  . Years of education: Not on file  . Highest education level: Not on file  Occupational History  . Occupation: disabled    Employer: DISABLED  Social Needs  . Financial resource strain: Not on file  . Food insecurity:    Worry: Not on file    Inability: Not on file  . Transportation needs:    Medical: Not on file    Non-medical: Not on file  Tobacco Use  . Smoking status: Current Some Day Smoker    Packs/day: 1.00    Years: 25.00    Pack years: 25.00    Types: Cigarettes  . Smokeless tobacco: Never Used  Substance and Sexual Activity  . Alcohol use: No  . Drug use: No  . Sexual activity: Not Currently    Birth control/protection: Surgical  Lifestyle  . Physical activity:    Days per week: Not on file    Minutes per session: Not on file  . Stress: Not on file  Relationships  . Social connections:    Talks on phone: Not on file    Gets together: Not on file    Attends religious service: Not on file    Active member of club or  organization: Not on file    Attends meetings of clubs or organizations: Not on file    Relationship status: Not on file  Other Topics Concern  . Not on file  Social History Narrative  . Not on file     Family History:  The patient's   family history includes Heart disease in her mother; Hypertension in her brother and sister.   ROS:   Please see the history of present illness.    Review of Systems  Constitution: Positive for malaise/fatigue.  HENT: Negative.   Eyes: Negative.   Cardiovascular: Positive for dyspnea on exertion and leg swelling.  Respiratory: Positive for cough, shortness of breath and wheezing.   Hematologic/Lymphatic: Negative.   Skin: Positive for rash.  Musculoskeletal: Positive for muscle weakness. Negative for joint pain.  Gastrointestinal: Negative.   Genitourinary: Negative.   Neurological: Positive for weakness.   All other systems reviewed and are negative.   PHYSICAL EXAM:   VS:  BP 128/78   Pulse 93   Ht 5' 1.5" (1.562 m)   Wt 191 lb 6.4 oz (86.8 kg)   SpO2 96%   BMI 35.58 kg/m   Physical Exam  GEN: Obese, in no acute distress  Neck: no JVD, carotid bruits, or masses Cardiac:RRR; no murmurs, rubs, or gallops  Respiratory: Decreased breath sounds with diffuse wheezing throughout. GI: soft, nontender, nondistended, + BS Ext: Mild right ankle edema without cyanosis, clubbing,  Good distal pulses bilaterally Neuro:  Alert and Oriented x 3 Psych: euthymic mood, full affect  Wt Readings from Last 3 Encounters:  10/11/18 191 lb 6.4 oz (86.8 kg)  09/20/18 188 lb 3.2 oz (85.4 kg)  09/03/18 184 lb (83.5 kg)      Studies/Labs Reviewed:   EKG:  EKG is not ordered today.   Recent Labs: 08/10/2018: B Natriuretic Peptide 32.0 08/14/2018: Magnesium 2.2 08/27/2018: TSH 2.323 09/13/2018: Hemoglobin 12.1; Platelets 315 09/21/2018: ALT 37 09/29/2018: BUN 8; Creat 0.91; Potassium 4.9; Sodium 140   Lipid Panel    Component Value Date/Time   CHOL 204  (H) 10/13/2017 1001   TRIG 162 (H) 10/13/2017 1001   HDL 59 10/13/2017 1001  CHOLHDL 3.5 10/13/2017 1001   VLDL 25 04/07/2017 1533   LDLCALC 117 (H) 10/13/2017 1001    Additional studies/ records that were reviewed today include:  Limited echo 8/23/19Study Conclusions   - Left ventricle: The cavity size was normal. Systolic function was   normal. The estimated ejection fraction was in the range of 55%   to 60%. Although no diagnostic regional wall motion abnormality   was identified, this possibility cannot be completely excluded on   the basis of this study. Due to tachycardia, there was fusion of   early and atrial contributions to ventricular filling.   Impressions:   - The previously described anteroseptal wall motion abnormality has   resolved virtually completely.     2D echo 08/11/2018  Study Conclusions   - Left ventricle: The cavity size was normal. Wall thickness was   increased in a pattern of mild LVH. The estimated ejection   fraction was 55%. There is hypokinesis of the   mid-apicalanteroseptal myocardium. Indeterminate diastolic   function. - Aortic valve: Mildly to moderately calcified annulus. Trileaflet;   mildly calcified leaflets. Mean gradient (S): 10 mm Hg. Peak   gradient (S): 20 mm Hg. Moderately sclerotic to mildly stenotic   aortic valve. VTI ratio of LVOT to aortic valve: 0.58. - Mitral valve: There was trivial regurgitation. - Right atrium: Central venous pressure (est): 3 mm Hg. - Atrial septum: No defect or patent foramen ovale was identified. - Tricuspid valve: There was trivial regurgitation. - Pulmonary arteries: Systolic pressure could not be accurately   estimated. - Pericardium, extracardiac: A prominent pericardial fat pad was   present.   Cardiac cath 8/22/2019Conclusions: 1. Severe ostial/proximal LAD stenosis with heavy calcification. 2. Upper normal left ventricular filling pressure. 3. Complex but ultimately successful PCI to  ostial and proximal LAD utilizing orbital atherectomy and IVUS with successful placement of overlapping Synergy 4.0 x 12 mm (proximal) and 3.0 x 20 mm (distal) drug eluting stents.  Procedure was complicated by abrupt closure of the proximal LAD during atherectomy, which resolved following stent placement.   Recommendations: 1. Close monitoring in 2-Heart ICU overnight. 2. Obtain limited echo to reevaluate LVEF and exclude pericardial effusion following complex intervention. 3. Aggressive secondary prevention, including high-intensity statin therapy and smoking cessation. 4. Gentle post-catheterization hydration.  If patient develops worsening shortness of breath, recommend restarting diuresis. 5. Gentle compression with ACE wrap to right upper arm hematoma.   Recommend uninterrupted dual antiplatelet therapy with Aspirin 81mg  daily and Ticagrelor 90mg  twice daily for a minimum of 12 months (ACS - Class I recommendation).    Nelva Bush, MD [facility] Pager: [phone]       ASSESSMENT:    1. Chronic diastolic CHF (congestive heart failure) ([place])   2. Coronary artery disease involving native coronary artery of native heart without angina pectoris   3. Essential hypertension   4. Hyperlipidemia, unspecified hyperlipidemia type   5. Mucopurulent chronic bronchitis (H[facility])      PLAN:  In order of problems listed above:   CAD status post NSTEMI 07/2018 treated with complicated overlapping DES to the LAD with residual OM disease treated medically.  LVEF improved to 55%.  Brilinta changed to Plavix because of significant bruising.  Tolerating well.  No angina.  Follow up with Dr. Bronson Ing in 2 months  Chronic diastolic CHF eats a high salt diet.  2 g sodium diet discussed and given.  Lab work checked earlier this month and kidney function stable.  Essential hypertension well-controlled.  Hyperlipidemia on Lipitor.  Check fasting lipid panel and LFTs.  COPD on home O2  followed by pulmonary.  Chronic prednisone  Medication Adjustments/Labs and Tests Ordered: Current medicines are reviewed at length with the patient today.  Concerns regarding medicines are outlined above.  Medication changes, Labs and Tests ordered today are listed in the Patient Instructions below. Patient Instructions  Medication Instructions:  Your physician recommends that you continue on your current medications as directed. Please refer to the Current Medication list given to you today.  If you need a refill on your cardiac medications before your next appointment, please call your pharmacy.   Lab work: Your physician recommends that you return for lab work in: Fasting   If you have labs (blood work) drawn today and your tests are completely normal, you will receive your results only by: Marland Kitchen MyChart Message (if you have MyChart) OR . A paper copy in the mail If you have any lab test that is abnormal or we need to change your treatment, we will call you to review the results.  Testing/Procedures: NONE   Follow-Up: At [facility], you and your health needs are our priority.  As part of our continuing mission to provide you with exceptional heart care, we have created designated Provider Care Teams.  These Care Teams include your primary Cardiologist (physician) and Advanced Practice Providers (APPs -  Physician Assistants and Nurse Practitioners) who all work together to provide you with the care you need, when you need it. You will need a follow up appointment next avaliable.  Please call our office 2 months in advance to schedule this appointment.  You may see Kate Sable, MD or one of the following Advanced Practice Providers on your designated Care Team:   Bernerd Pho, PA-C [facility]) . Ermalinda Barrios, PA-C ([place])  Any Other Special Instructions Will Be Listed Below (If Applicable). Thank you for choosing [facility]!   Low-Sodium  Eating Plan Sodium, which is an element that makes up salt, helps you maintain a healthy balance of fluids in your body. Too much sodium can increase your blood pressure and cause fluid and waste to be held in your body. Your health care provider or dietitian may recommend following this plan if you have high blood pressure (hypertension), kidney disease, liver disease, or heart failure. Eating less sodium can help lower your blood pressure, reduce swelling, and protect your heart, liver, and kidneys. What are tips for following this plan? General guidelines  Most people on this plan should limit their sodium intake to 1,500-2,000 mg (milligrams) of sodium each day. Reading food labels  The Nutrition Facts label lists the amount of sodium in one serving of the food. If you eat more than one serving, you must multiply the listed amount of sodium by the number of servings.  Choose foods with less than 140 mg of sodium per serving.  Avoid foods with 300 mg of sodium or more per serving. Shopping  Look for lower-sodium products, often labeled as "low-sodium" or "no salt added."  Always check the sodium content even if foods are labeled as "unsalted" or "no salt added".  Buy fresh foods. ? Avoid canned foods and premade or frozen meals. ? Avoid canned, cured, or processed meats  Buy breads that have less than 80 mg of sodium per slice. Cooking  Eat more home-cooked food and less restaurant, buffet, and fast food.  Avoid adding salt when cooking. Use salt-free  seasonings or herbs instead of table salt or sea salt. Check with your health care provider or pharmacist before using salt substitutes.  Cook with plant-based oils, such as canola, sunflower, or olive oil. Meal planning  When eating at a restaurant, ask that your food be prepared with less salt or no salt, if possible.  Avoid foods that contain MSG (monosodium glutamate). MSG is sometimes added to [place] food, bouillon, and some  canned foods. What foods are recommended? The items listed may not be a complete list. Talk with your dietitian about what dietary choices are best for you. Grains Low-sodium cereals, including oats, puffed wheat and rice, and shredded wheat. Low-sodium crackers. Unsalted rice. Unsalted pasta. Low-sodium bread. Whole-grain breads and whole-grain pasta. Vegetables Fresh or frozen vegetables. "No salt added" canned vegetables. "No salt added" tomato sauce and paste. Low-sodium or reduced-sodium tomato and vegetable juice. Fruits Fresh, frozen, or canned fruit. Fruit juice. Meats and other protein foods Fresh or frozen (no salt added) meat, poultry, seafood, and fish. Low-sodium canned tuna and salmon. Unsalted nuts. Dried peas, beans, and lentils without added salt. Unsalted canned beans. Eggs. Unsalted nut butters. Dairy Milk. Soy milk. Cheese that is naturally low in sodium, such as ricotta cheese, fresh mozzarella, or Swiss cheese Low-sodium or reduced-sodium cheese. Cream cheese. Yogurt. Fats and oils Unsalted butter. Unsalted margarine with no trans fat. Vegetable oils such as canola or olive oils. Seasonings and other foods Fresh and dried herbs and spices. Salt-free seasonings. Low-sodium mustard and ketchup. Sodium-free salad dressing. Sodium-free light mayonnaise. Fresh or refrigerated horseradish. Lemon juice. Vinegar. Homemade, reduced-sodium, or low-sodium soups. Unsalted popcorn and pretzels. Low-salt or salt-free chips. What foods are not recommended? The items listed may not be a complete list. Talk with your dietitian about what dietary choices are best for you. Grains Instant hot cereals. Bread stuffing, pancake, and biscuit mixes. Croutons. Seasoned rice or pasta mixes. Noodle soup cups. Boxed or frozen macaroni and cheese. Regular salted crackers. Self-rising flour. Vegetables Sauerkraut, pickled vegetables, and relishes. Olives. [place] fries. Onion rings. Regular canned  vegetables (not low-sodium or reduced-sodium). Regular canned tomato sauce and paste (not low-sodium or reduced-sodium). Regular tomato and vegetable juice (not low-sodium or reduced-sodium). Frozen vegetables in sauces. Meats and other protein foods Meat or fish that is salted, canned, smoked, spiced, or pickled. Bacon, ham, sausage, hotdogs, corned beef, chipped beef, packaged lunch meats, salt pork, jerky, pickled herring, anchovies, regular canned tuna, sardines, salted nuts. Dairy Processed cheese and cheese spreads. Cheese curds. Blue cheese. Feta cheese. String cheese. Regular cottage cheese. Buttermilk. Canned milk. Fats and oils Salted butter. Regular margarine. Ghee. Bacon fat. Seasonings and other foods Onion salt, garlic salt, seasoned salt, table salt, and sea salt. Canned and packaged gravies. Worcestershire sauce. Tartar sauce. Barbecue sauce. Teriyaki sauce. Soy sauce, including reduced-sodium. Steak sauce. Fish sauce. Oyster sauce. Cocktail sauce. Horseradish that you find on the shelf. Regular ketchup and mustard. Meat flavorings and tenderizers. Bouillon cubes. Hot sauce and Tabasco sauce. Premade or packaged marinades. Premade or packaged taco seasonings. Relishes. Regular salad dressings. Salsa. Potato and tortilla chips. Corn chips and puffs. Salted popcorn and pretzels. Canned or dried soups. Pizza. Frozen entrees and pot pies. Summary  Eating less sodium can help lower your blood pressure, reduce swelling, and protect your heart, liver, and kidneys.  Most people on this plan should limit their sodium intake to 1,500-2,000 mg (milligrams) of sodium each day.  Canned, boxed, and frozen foods are high in sodium. Restaurant foods,  fast foods, and pizza are also very high in sodium. You also get sodium by adding salt to food.  Try to cook at home, eat more fresh fruits and vegetables, and eat less fast food, canned, processed, or prepared foods. This information is not intended  to replace advice given to you by your health care provider. Make sure you discuss any questions you have with your health care provider. Document Released: 06/06/2002 Document Revised: 12/08/2016 Document Reviewed: 12/08/2016 Elsevier Interactive Patient Education  2018 [place], Ermalinda Barrios, [place]  10/11/2018 11:34 AM    [facility] [place], [place], [place]  [address] Phone: [phone]; Fax: [phone]

## 2018-10-11 ENCOUNTER — Encounter: Payer: Self-pay | Admitting: Physician Assistant

## 2018-10-11 ENCOUNTER — Ambulatory Visit (INDEPENDENT_AMBULATORY_CARE_PROVIDER_SITE_OTHER): Payer: Medicare Other | Admitting: Physician Assistant

## 2018-10-11 VITALS — BP 128/78 | HR 93 | Ht 61.5 in | Wt 191.4 lb

## 2018-10-11 DIAGNOSIS — I2510 Atherosclerotic heart disease of native coronary artery without angina pectoris: Secondary | ICD-10-CM | POA: Diagnosis not present

## 2018-10-11 DIAGNOSIS — I5032 Chronic diastolic (congestive) heart failure: Secondary | ICD-10-CM

## 2018-10-11 DIAGNOSIS — I10 Essential (primary) hypertension: Secondary | ICD-10-CM

## 2018-10-11 DIAGNOSIS — E785 Hyperlipidemia, unspecified: Secondary | ICD-10-CM

## 2018-10-11 DIAGNOSIS — J411 Mucopurulent chronic bronchitis: Secondary | ICD-10-CM

## 2018-10-11 MED ORDER — METOPROLOL TARTRATE 25 MG PO TABS: 12.5000 mg | ORAL_TABLET | Freq: Two times a day (BID) | ORAL | 11 refills | Status: DC

## 2018-10-11 NOTE — Patient Instructions (Signed)
Medication Instructions:  Your physician recommends that you continue on your current medications as directed. Please refer to the Current Medication list given to you today.  If you need a refill on your cardiac medications before your next appointment, please call your pharmacy.   Lab work: Your physician recommends that you return for lab work in: Fasting   If you have labs (blood work) drawn today and your tests are completely normal, you will receive your results only by: Marland Kitchen MyChart Message (if you have MyChart) OR . A paper copy in the mail If you have any lab test that is abnormal or we need to change your treatment, we will call you to review the results.  Testing/Procedures: NONE   Follow-Up: At [facility] you and your health needs are our priority.  As part of our continuing mission to provide you with exceptional heart care, we have created designated Provider Care Teams.  These Care Teams include your primary Cardiologist (physician) and Advanced Practice Providers (APPs -  Physician Assistants and Nurse Practitioners) who all work together to provide you with the care you need, when you need it. You will need a follow up appointment next avaliable.  Please call our office 2 months in advance to schedule this appointment.  You may see Kate Sable, MD or one of the following Advanced Practice Providers on your designated Care Team:   Bernerd Pho, PA-C [facility]) . Ermalinda Barrios, PA-C ([place])  Any Other Special Instructions Will Be Listed Below (If Applicable). Thank you for choosing [facility]!   Low-Sodium Eating Plan Sodium, which is an element that makes up salt, helps you maintain a healthy balance of fluids in your body. Too much sodium can increase your blood pressure and cause fluid and waste to be held in your body. Your health care provider or dietitian may recommend following this plan if you have high blood pressure  (hypertension), kidney disease, liver disease, or heart failure. Eating less sodium can help lower your blood pressure, reduce swelling, and protect your heart, liver, and kidneys. What are tips for following this plan? General guidelines  Most people on this plan should limit their sodium intake to 1,500-2,000 mg (milligrams) of sodium each day. Reading food labels  The Nutrition Facts label lists the amount of sodium in one serving of the food. If you eat more than one serving, you must multiply the listed amount of sodium by the number of servings.  Choose foods with less than 140 mg of sodium per serving.  Avoid foods with 300 mg of sodium or more per serving. Shopping  Look for lower-sodium products, often labeled as "low-sodium" or "no salt added."  Always check the sodium content even if foods are labeled as "unsalted" or "no salt added".  Buy fresh foods. ? Avoid canned foods and premade or frozen meals. ? Avoid canned, cured, or processed meats  Buy breads that have less than 80 mg of sodium per slice. Cooking  Eat more home-cooked food and less restaurant, buffet, and fast food.  Avoid adding salt when cooking. Use salt-free seasonings or herbs instead of table salt or sea salt. Check with your health care provider or pharmacist before using salt substitutes.  Cook with plant-based oils, such as canola, sunflower, or olive oil. Meal planning  When eating at a restaurant, ask that your food be prepared with less salt or no salt, if possible.  Avoid foods that contain MSG (monosodium glutamate). MSG is sometimes  added to [place] food, bouillon, and some canned foods. What foods are recommended? The items listed may not be a complete list. Talk with your dietitian about what dietary choices are best for you. Grains Low-sodium cereals, including oats, puffed wheat and rice, and shredded wheat. Low-sodium crackers. Unsalted rice. Unsalted pasta. Low-sodium bread.  Whole-grain breads and whole-grain pasta. Vegetables Fresh or frozen vegetables. "No salt added" canned vegetables. "No salt added" tomato sauce and paste. Low-sodium or reduced-sodium tomato and vegetable juice. Fruits Fresh, frozen, or canned fruit. Fruit juice. Meats and other protein foods Fresh or frozen (no salt added) meat, poultry, seafood, and fish. Low-sodium canned tuna and salmon. Unsalted nuts. Dried peas, beans, and lentils without added salt. Unsalted canned beans. Eggs. Unsalted nut butters. Dairy Milk. Soy milk. Cheese that is naturally low in sodium, such as ricotta cheese, fresh mozzarella, or Swiss cheese Low-sodium or reduced-sodium cheese. Cream cheese. Yogurt. Fats and oils Unsalted butter. Unsalted margarine with no trans fat. Vegetable oils such as canola or olive oils. Seasonings and other foods Fresh and dried herbs and spices. Salt-free seasonings. Low-sodium mustard and ketchup. Sodium-free salad dressing. Sodium-free light mayonnaise. Fresh or refrigerated horseradish. Lemon juice. Vinegar. Homemade, reduced-sodium, or low-sodium soups. Unsalted popcorn and pretzels. Low-salt or salt-free chips. What foods are not recommended? The items listed may not be a complete list. Talk with your dietitian about what dietary choices are best for you. Grains Instant hot cereals. Bread stuffing, pancake, and biscuit mixes. Croutons. Seasoned rice or pasta mixes. Noodle soup cups. Boxed or frozen macaroni and cheese. Regular salted crackers. Self-rising flour. Vegetables Sauerkraut, pickled vegetables, and relishes. Olives. [place] fries. Onion rings. Regular canned vegetables (not low-sodium or reduced-sodium). Regular canned tomato sauce and paste (not low-sodium or reduced-sodium). Regular tomato and vegetable juice (not low-sodium or reduced-sodium). Frozen vegetables in sauces. Meats and other protein foods Meat or fish that is salted, canned, smoked, spiced, or pickled.  Bacon, ham, sausage, hotdogs, corned beef, chipped beef, packaged lunch meats, salt pork, jerky, pickled herring, anchovies, regular canned tuna, sardines, salted nuts. Dairy Processed cheese and cheese spreads. Cheese curds. Blue cheese. Feta cheese. String cheese. Regular cottage cheese. Buttermilk. Canned milk. Fats and oils Salted butter. Regular margarine. Ghee. Bacon fat. Seasonings and other foods Onion salt, garlic salt, seasoned salt, table salt, and sea salt. Canned and packaged gravies. Worcestershire sauce. Tartar sauce. Barbecue sauce. Teriyaki sauce. Soy sauce, including reduced-sodium. Steak sauce. Fish sauce. Oyster sauce. Cocktail sauce. Horseradish that you find on the shelf. Regular ketchup and mustard. Meat flavorings and tenderizers. Bouillon cubes. Hot sauce and Tabasco sauce. Premade or packaged marinades. Premade or packaged taco seasonings. Relishes. Regular salad dressings. Salsa. Potato and tortilla chips. Corn chips and puffs. Salted popcorn and pretzels. Canned or dried soups. Pizza. Frozen entrees and pot pies. Summary  Eating less sodium can help lower your blood pressure, reduce swelling, and protect your heart, liver, and kidneys.  Most people on this plan should limit their sodium intake to 1,500-2,000 mg (milligrams) of sodium each day.  Canned, boxed, and frozen foods are high in sodium. Restaurant foods, fast foods, and pizza are also very high in sodium. You also get sodium by adding salt to food.  Try to cook at home, eat more fresh fruits and vegetables, and eat less fast food, canned, processed, or prepared foods. This information is not intended to replace advice given to you by your health care provider. Make sure you discuss any questions you have with your  health care provider. Document Released: 06/06/2002 Document Revised: 12/08/2016 Document Reviewed: 12/08/2016 Elsevier Interactive Patient Education  [employer].

## 2018-10-13 ENCOUNTER — Other Ambulatory Visit: Payer: Self-pay

## 2018-10-13 DIAGNOSIS — I214 Non-ST elevation (NSTEMI) myocardial infarction: Secondary | ICD-10-CM

## 2018-10-13 DIAGNOSIS — M7989 Other specified soft tissue disorders: Secondary | ICD-10-CM

## 2018-10-13 DIAGNOSIS — J441 Chronic obstructive pulmonary disease with (acute) exacerbation: Secondary | ICD-10-CM

## 2018-10-14 ENCOUNTER — Telehealth: Payer: Self-pay | Admitting: *Deleted

## 2018-10-14 ENCOUNTER — Other Ambulatory Visit: Payer: Medicare Other

## 2018-10-14 ENCOUNTER — Other Ambulatory Visit: Payer: Self-pay

## 2018-10-14 ENCOUNTER — Other Ambulatory Visit (HOSPITAL_COMMUNITY)
Admission: RE | Admit: 2018-10-14 | Discharge: 2018-10-14 | Disposition: A | Discharge status: Home / Self Care | Payer: Medicare Other | Source: Ambulatory Visit | Attending: Physician Assistant | Admitting: Physician Assistant

## 2018-10-14 ENCOUNTER — Encounter: Payer: Self-pay | Admitting: Family Medicine

## 2018-10-14 ENCOUNTER — Ambulatory Visit (INDEPENDENT_AMBULATORY_CARE_PROVIDER_SITE_OTHER): Payer: Medicare Other | Admitting: Family Medicine

## 2018-10-14 VITALS — BP 128/84 | HR 116 | Temp 98.3°F | Resp 18 | Ht 61.5 in | Wt 190.0 lb

## 2018-10-14 DIAGNOSIS — E785 Hyperlipidemia, unspecified: Secondary | ICD-10-CM

## 2018-10-14 DIAGNOSIS — J441 Chronic obstructive pulmonary disease with (acute) exacerbation: Secondary | ICD-10-CM | POA: Diagnosis not present

## 2018-10-14 DIAGNOSIS — Z79899 Other long term (current) drug therapy: Secondary | ICD-10-CM

## 2018-10-14 DIAGNOSIS — E876 Hypokalemia: Secondary | ICD-10-CM | POA: Diagnosis not present

## 2018-10-14 LAB — LIPID PANEL
CHOL/HDL RATIO: 3.1 ratio
CHOLESTEROL: 157 mg/dL (ref 0–200)
HDL: 51 mg/dL (ref >40)
LDL Cholesterol: 81 mg/dL (ref 0–99)
Triglycerides: 126 mg/dL (ref <150)
VLDL: 25 mg/dL (ref 0–40)

## 2018-10-14 LAB — BASIC METABOLIC PANEL
BUN: 7 mg/dL (ref 7–25)
CHLORIDE: 93 mmol/L — AB (ref 98–110)
CO2: 38 mmol/L — AB (ref 20–32)
CREATININE: 0.73 mg/dL (ref 0.50–0.99)
Calcium: 9.7 mg/dL (ref 8.6–10.4)
GLUCOSE: 123 mg/dL — AB (ref 65–99)
Potassium: 3.8 mmol/L (ref 3.5–5.3)
Sodium: 140 mmol/L (ref 135–146)

## 2018-10-14 LAB — HEPATIC FUNCTION PANEL
ALT: 32 U/L (ref 0–44)
AST: 28 U/L (ref 15–41)
Albumin: 3.6 g/dL (ref 3.5–5.0)
Alkaline Phosphatase: 69 U/L (ref 38–126)
BILIRUBIN DIRECT: 0.1 mg/dL (ref 0.0–0.2)
BILIRUBIN INDIRECT: 0.5 mg/dL (ref 0.3–0.9)
BILIRUBIN TOTAL: 0.6 mg/dL (ref 0.3–1.2)
Total Protein: 6.3 g/dL — ABNORMAL LOW (ref 6.5–8.1)

## 2018-10-14 MED ORDER — PREDNISONE 20 MG PO TABS: ORAL_TABLET | ORAL | 0 refills | Status: DC

## 2018-10-14 MED ORDER — ALBUTEROL SULFATE (2.5 MG/3ML) 0.083% IN NEBU
2.5000 mg | INHALATION_SOLUTION | Freq: Once | RESPIRATORY_TRACT | Status: AC
  Administered 2018-10-14: 2.5 mg via RESPIRATORY_TRACT

## 2018-10-14 MED ORDER — POTASSIUM CHLORIDE CRYS ER 20 MEQ PO TBCR: 20.0000 meq | EXTENDED_RELEASE_TABLET | Freq: Every day | ORAL | 3 refills | Status: DC

## 2018-10-14 MED ORDER — HYDROCODONE-HOMATROPINE 5-1.5 MG/5ML PO SYRP: 5.0000 mL | ORAL_SOLUTION | Freq: Three times a day (TID) | ORAL | 0 refills | Status: DC | PRN

## 2018-10-14 MED ORDER — IPRATROPIUM-ALBUTEROL 0.5-2.5 (3) MG/3ML IN SOLN
3.0000 mL | Freq: Once | RESPIRATORY_TRACT | Status: AC
  Administered 2018-10-14: 3 mL via RESPIRATORY_TRACT

## 2018-10-14 NOTE — Progress Notes (Signed)
Patient ID: Cindy [name], female    DOB: May 30, 1956, 62 y.o.   MRN: [id number]  PCP: Alycia Rossetti, MD  Chief Complaint  Patient presents with  . COPD Exacerbation    cough, wheezing, chest congestion    Subjective:   Cindy [name] is a 62 y.o. female, presents to clinic with CC of cough and wheeze worse than her baseline.  She reports that she is unable to do some of her cardiac rehab because of the symptoms and heart rate is too elevated to safely do it - this is what she reports she is been told.  She reports doing her breathing treatments with a nebulizer machine twice a day, Symbicort twice a day and a albuterol treatments once a day in the midday but not any more frequently than that despite her wheeze and cough.  When reviewing her inhalers with her she is actually not sure which ones she does for maintenance and which for rescue.  She denies CP, fever, sweats, chills, weight changes.  She has not been requiring more O2 than her baseline - 3L.  She has been seen recently by cardiology and did labs for cardiology this am.  She has labs to do today per her PCP - Dr. Dennard Schaumann.  She wants vaccines also one steroid shot in clinic.  She also requests refill on her pain medication however cannot find any pain medication prescribed for her by her PCP since March 2019 and last narcotic pain medicine from the ER in August 2019.    Patient Active Problem List   Diagnosis Date Noted  . CAD (coronary artery disease) 10/05/2018  . Chronic diastolic CHF (congestive heart failure) ([place])   . S/P PTCA (percutaneous transluminal coronary angioplasty)   . Orthopnea   . Non-ST elevation (NSTEMI) myocardial infarction ([place])   . Essential hypertension   . Acute on chronic respiratory failure ([place]) 08/12/2018  . Bilateral leg edema 08/11/2018  . Hypokalemia 08/11/2018  . Abnormal EKG 08/11/2018  . Diastolic dysfunction 38/25/0539  . Hypercarbia 08/10/2018  . Secondary pulmonary hypertension  05/01/2016  . Leg swelling 04/15/2016  . Rash and nonspecific skin eruption 08/15/2014  . Ureteral stone with hydronephrosis 05/30/2013  . Allergic rhinitis 04/11/2013  . Ecchymosis 04/11/2013  . Kidney stones 04/11/2013  . Lumbar back pain 03/03/2013  . Compression fracture of L3 lumbar vertebra 03/03/2013  . Constipation 03/03/2013  . Sinus tachycardia 06/27/2012  . Osteopenia 03/16/2012  . GERD (gastroesophageal reflux disease) 03/16/2012  . Thrush 03/02/2012  . Chronic hypoxemic respiratory failure ([place]) 02/27/2012  . Hyperlipidemia 02/25/2012  . Hyperglycemia, drug-induced 09/25/2011  . Tobacco abuse 09/24/2011  . COPD (chronic obstructive pulmonary disease) ([place]) 12/24/2009     Prior to Admission medications   Medication Sig Start Date End Date Taking? Authorizing Provider  albuterol (PROVENTIL HFA;VENTOLIN HFA) 108 (90 Base) MCG/ACT inhaler Inhale 2 puffs into the lungs every 6 (six) hours as needed for wheezing or shortness of breath. 05/18/18  Yes [place], Rose Fillers, MD  albuterol (PROVENTIL) (2.5 MG/3ML) 0.083% nebulizer solution INHALE 1 VIAL VIA NEBULIZER EVERY 6 HOURS AS NEEDED. 09/14/18  Yes [place], Modena Nunnery, MD  alendronate (FOSAMAX) 70 MG tablet Take 1 tablet (70 mg total) by mouth every Wednesday. TAKE 1 TABLET BY MOUTH EVERY WEEK ON AN EMPTY STOMACH. 08/25/18  Yes [place], Modena Nunnery, MD  aspirin EC 81 MG tablet Take 81 mg by mouth every morning.    Yes [provider]  atorvastatin (  LIPITOR) 40 MG tablet Take 1 tablet (40 mg total) by mouth daily. 09/13/18 09/08/19 Yes [place], Modena Nunnery, MD  budesonide-formoterol [facility]) 160-4.5 MCG/ACT inhaler Inhale 2 puffs into the lungs 2 (two) times daily. 04/27/18  Yes [place], Modena Nunnery, MD  Calcium Carb-Cholecalciferol 600-800 MG-UNIT TABS Take 1 tablet by mouth 2 (two) times daily.    Yes [provider]  clopidogrel (PLAVIX) 75 MG tablet Take 1 tablet (75 mg total) by mouth daily. 09/08/18  Yes Dunn, Dayna N, PA-C   cyclobenzaprine (FLEXERIL) 10 MG tablet TAKE 1 TABLET BY MOUTH 3 TIMES A DAY AS NEEDED FOR MUSCLE SPASMS. 08/27/18  Yes [place], Modena Nunnery, MD  dextromethorphan-guaiFENesin [facility] DM) 30-600 MG 12hr tablet Take 1 tablet by mouth 2 (two) times daily.   Yes [provider]  diphenhydrAMINE (BENADRYL) 12.5 MG/5ML liquid Take 6.25 mg by mouth 4 (four) times daily as needed.   Yes [provider]  furosemide (LASIX) 40 MG tablet Take 1 tablet (40 mg total) by mouth daily. Patient taking differently: Take 40-80 mg by mouth See admin instructions. Alternate taking 80mg  one day, then take 40mg  the next day, then repeat. 06/02/18  Yes [place], Modena Nunnery, MD  metoprolol tartrate (LOPRESSOR) 25 MG tablet Take 0.5 tablets (12.5 mg total) by mouth 2 (two) times daily. 10/11/18  Yes Imogene Burn, PA-C  nitroGLYCERIN (NITROSTAT) 0.4 MG SL tablet Place 1 tablet (0.4 mg total) under the tongue every 5 (five) minutes as needed for chest pain. If no improvement with 3 doses call 911 08/27/18  Yes Delsa Grana, PA-C  nystatin (MYCOSTATIN) 100000 UNIT/ML suspension TAKE 5 ML BY MOUTH FOUR TIMES A DAY AS DIRECTED. 10/04/18  Yes [place], Modena Nunnery, MD  oxyCODONE-acetaminophen (PERCOCET/ROXICET) 5-325 MG tablet Take 1 tablet by mouth every 4 (four) hours as needed for severe pain. 08/23/18  Yes Caryl Ada K, PA-C  OXYGEN Inhale 3 L into the lungs continuous.    Yes [provider]  pantoprazole (PROTONIX) 40 MG tablet Take 1 tablet (40 mg total) by mouth 2 (two) times daily. 06/09/18  Yes , Modena Nunnery, MD  polyethylene glycol [facility] / GLYCOLAX) packet Take 17 g by mouth daily. 06/04/18  Yes [place], Ankit, MD  potassium chloride 20 MEQ/15ML (10%) SOLN Take 15 mLs (20 mEq total) by mouth daily. 09/23/18  Yes , Modena Nunnery, MD  predniSONE (DELTASONE) 10 MG tablet Take 1-4 tablets (10-40 mg total) by mouth daily with breakfast. Take 40mg  daily for 2days then 20mg  daily for 2days then go back  to Basal dose of 10mg  daily Patient taking differently: Take 10 mg by mouth daily with breakfast.  08/21/18  Yes Domenic Polite, MD  Tiotropium Bromide Monohydrate (SPIRIVA RESPIMAT) 2.5 MCG/ACT AERS INHALE 2 PUFFS INTO THE LUNGS ONCE DAILY Patient taking differently: Inhale 2 puffs into the lungs at bedtime.  05/28/18  Yes Delsa Grana, PA-C     Allergies  Allergen Reactions  . Keflex [Cephalexin] Anaphylaxis and Rash  . Doxycycline Other (See Comments)    Gave patient oral thrush  . Sulfa Antibiotics   . Avelox [Moxifloxacin Hcl In Nacl] Itching and Rash  . Toradol [Ketorolac Tromethamine] Swelling, Rash and Other (See Comments)    Bruising, swelling, rash at injection site     Family History  Problem Relation Age of Onset  . Heart disease Mother   . Hypertension Sister   . Hypertension Brother      Social History   Socioeconomic History  . Marital  status: Divorced    Spouse name: Not on file  . Number of children: 3  . Years of education: Not on file  . Highest education level: Not on file  Occupational History  . Occupation: disabled    Employer: DISABLED  Social Needs  . Financial resource strain: Not on file  . Food insecurity:    Worry: Not on file    Inability: Not on file  . Transportation needs:    Medical: Not on file    Non-medical: Not on file  Tobacco Use  . Smoking status: Current Some Day Smoker    Packs/day: 1.00    Years: 25.00    Pack years: 25.00    Types: Cigarettes  . Smokeless tobacco: Never Used  Substance and Sexual Activity  . Alcohol use: No  . Drug use: No  . Sexual activity: Not Currently    Birth control/protection: Surgical  Lifestyle  . Physical activity:    Days per week: Not on file    Minutes per session: Not on file  . Stress: Not on file  Relationships  . Social connections:    Talks on phone: Not on file    Gets together: Not on file    Attends religious service: Not on file    Active member of club or  organization: Not on file    Attends meetings of clubs or organizations: Not on file    Relationship status: Not on file  . Intimate partner violence:    Fear of current or ex partner: Not on file    Emotionally abused: Not on file    Physically abused: Not on file    Forced sexual activity: Not on file  Other Topics Concern  . Not on file  Social History Narrative  . Not on file     Review of Systems  Constitutional: Negative.   HENT: Negative.   Eyes: Negative.   Respiratory: Negative.   Cardiovascular: Negative.   Gastrointestinal: Negative.   Endocrine: Negative.   Genitourinary: Negative.   Musculoskeletal: Negative.   Skin: Negative.   Allergic/Immunologic: Negative.   Neurological: Negative.   Hematological: Negative.   Psychiatric/Behavioral: Negative.   All other systems reviewed and are negative.      Objective:    Vitals:   10/14/18 1002  BP: 128/84  Pulse: (!) 116  Resp: 18  Temp: 98.3 F (36.8 C)  TempSrc: Oral  SpO2: 94%  Weight: 190 lb (86.2 kg)  Height: 5' 1.5" (1.562 m)      Physical Exam  Constitutional: She appears well-developed. No distress.  Obese, chronically ill appearing female, with O2 via [place], non-toxic appearing  HENT:  Head: Normocephalic and atraumatic.  Nose: Nose normal.  Mouth/Throat: Oropharynx is clear and moist.  Eyes: Pupils are equal, round, and reactive to light. Conjunctivae are normal.  Neck: Normal range of motion. Neck supple. No JVD present.  Cardiovascular: Regular rhythm and normal heart sounds. Tachycardia present. Exam reveals no gallop and no friction rub.  No murmur heard. Pulses:      Radial pulses are 2+ on the right side, and 2+ on the left side.  Pulmonary/Chest: No stridor. No respiratory distress. She has wheezes. She has no rales. She exhibits no tenderness.  Abdominal: Soft. Bowel sounds are normal. She exhibits no distension. There is no tenderness. There is no guarding.  Neurological: She is  alert.  Skin: Skin is warm. Capillary refill takes less than 2 seconds. She is not diaphoretic. No  pallor.  Psychiatric: She has a normal mood and affect. Her behavior is normal.  Nursing note and vitals reviewed.         Assessment & Plan:      ICD-10-CM   1. COPD exacerbation (HCC) J44.1 albuterol (PROVENTIL) (2.5 MG/3ML) 0.083% nebulizer solution 2.5 mg    ipratropium-albuterol (DUONEB) 0.5-2.5 (3) MG/3ML nebulizer solution 3 mL  2. Hypopotassemia Q22.2 Basic metabolic panel    Patient with continued wheeze and shortness of breath with history of chronic respiratory failure, oxygen dependence and COPD, I have seen this patient a few times before and her lungs sound better than any time of ever examined her.  She chronically has tachycardia, today her heart rate is about at baseline for her.  She frequently has a long steroid taper is given to her by her pulmonologist, I have told her I could started a steroid burst for her reported worsening breathing and wheeze, but I have instructed her to f/up with Pulmonology for long tapers and follow up.  Cardiology has reportedly stopped rehab due to tachycardia?  But have asked the patient if cardiology has worked this up and she is a extremely difficult historian, she is very tangential in her thought processes in conversation and she also has very poor recall of current and remote facts and history, that mind she is said that cardiology needed someone else to work-up the tachycardia so that she can continue cardiac rehab safely.  I referred her back to pulmonology I am unsure if she is end-stage respiratory failure?  And have encouraged her to follow-up with her PCP for routine problems, as I am unfamiliar with the complexity of her medical hx.    She is routinely asked me for narcotic pain medications and I have told her that we will not prescribe pain medicines for her for her chronic back pain that was previously referred out to spinal specialist.     She did have labs pending from her PCP f/up on low potassium, and lipids were done earlier today by cardiology.  Delsa Grana, PA-C 10/14/18 10:33 AM

## 2018-10-14 NOTE — Patient Instructions (Addendum)
Follow up with your Pulmonology  Prednisone 60 mg for 3 days, 40 mg for 3 days and 20 mg for 3 days  Calculate that at home and call if you need assistance with this.

## 2018-10-14 NOTE — Telephone Encounter (Signed)
-----   Message from Imogene Burn, [place] sent at 10/14/2018  2:48 PM EDT ----- Lipids improved on lipitor and lft's normal. LDL 81, goal is < 70. Stay on same dose and recheck in 3-4 months

## 2018-10-20 ENCOUNTER — Other Ambulatory Visit: Payer: Self-pay | Admitting: Family Medicine

## 2018-10-20 ENCOUNTER — Telehealth: Payer: Self-pay | Admitting: Family Medicine

## 2018-10-20 NOTE — Telephone Encounter (Signed)
No she needs to go back to her pulmonologist. Cardiology seemed to think her heart was being stressed by her lungs so they wanted to stop her cardiac rehab.  I treated some wheeze, but her lung disease is so advanced she needs to see her specialist for it or come see Dr. Dennard Schaumann.

## 2018-10-20 NOTE — Telephone Encounter (Signed)
Pt states we were suppose to call in antibiotic to pharmacy for her last week and nothing was ever called in to [facility] [employer]. Also she wants to know her lab results.

## 2018-10-20 NOTE — Telephone Encounter (Signed)
Per last AVS, no ABTx was to be sent.   Call placed to patient and patient made aware.   Also advised that labs are awaiting review.

## 2018-10-20 NOTE — Telephone Encounter (Signed)
Requesting refill  - OK to refill  Prednisone?

## 2018-10-25 ENCOUNTER — Other Ambulatory Visit: Payer: Self-pay | Admitting: Family Medicine

## 2018-10-25 ENCOUNTER — Other Ambulatory Visit: Payer: Self-pay

## 2018-10-25 DIAGNOSIS — M545 Low back pain, unspecified: Secondary | ICD-10-CM

## 2018-10-25 NOTE — Patient Outreach (Signed)
[place] [facility]) Care Management  10/25/2018  Cindy [name] 06-14-1956 [id number]   Successful outreach with Cindy [name]. She reported feeling well. Denied complaints of shortness of breath at the time of the call but reported continued shortness of breath with minimal exertion. Reported multiple appointments over the past few weeks and feeling "wiped out" when she returns home. Denied complaints of chest discomfort. Reported productive cough with "clear to white" sputum and swelling to extremities. Continues frequent use of rescue inhalers. Cindy [name] was encouraged to perform daily weights and maintain log to monitor changes. Also encouraged to engage in smoking cessation counseling. She agreed to attempt daily weights but does not want to participate in smoking cessation counseling at this time. Cindy [name] denied urgent concerns. Agreeable to outreach on next month.  PLAN Will follow up on next month.  [place] [phone]

## 2018-10-25 NOTE — Telephone Encounter (Signed)
Refill on oxycodone to [facility] °

## 2018-10-28 ENCOUNTER — Other Ambulatory Visit: Payer: Self-pay | Admitting: Family Medicine

## 2018-10-28 ENCOUNTER — Other Ambulatory Visit: Payer: Self-pay | Admitting: *Deleted

## 2018-10-28 DIAGNOSIS — M545 Low back pain, unspecified: Secondary | ICD-10-CM

## 2018-10-28 MED ORDER — OXYCODONE-ACETAMINOPHEN 5-325 MG PO TABS: 1.0000 | ORAL_TABLET | Freq: Two times a day (BID) | ORAL | 0 refills | Status: DC

## 2018-10-28 NOTE — Telephone Encounter (Signed)
Patient is on Oxycodone/ APAP 5/325mg  BID PRN for lumbar back pain.   Last refill from PCP noted on 05/21/2018, #30 tabs.   Ok to refill?

## 2018-10-28 NOTE — Telephone Encounter (Signed)
I do not see oxycodone on her med list.  Is she talking about cough syrup?  Why is she on oxycodone?

## 2018-11-02 ENCOUNTER — Other Ambulatory Visit: Payer: Self-pay | Admitting: Family Medicine

## 2018-11-02 ENCOUNTER — Other Ambulatory Visit: Payer: Self-pay | Admitting: *Deleted

## 2018-11-02 MED ORDER — PREDNISONE 20 MG PO TABS: 20.0000 mg | ORAL_TABLET | Freq: Every day | ORAL | 0 refills | Status: DC

## 2018-11-03 ENCOUNTER — Other Ambulatory Visit: Payer: Self-pay | Admitting: Family Medicine

## 2018-11-10 ENCOUNTER — Ambulatory Visit: Payer: Self-pay

## 2018-11-11 ENCOUNTER — Emergency Department (HOSPITAL_COMMUNITY): Payer: Medicare Other

## 2018-11-11 ENCOUNTER — Emergency Department (HOSPITAL_COMMUNITY)
Admission: EM | Admit: 2018-11-11 | Discharge: 2018-11-11 | Disposition: A | Discharge status: Home / Self Care | Payer: Medicare Other | Attending: Emergency Medicine | Admitting: Emergency Medicine

## 2018-11-11 ENCOUNTER — Other Ambulatory Visit: Payer: Self-pay

## 2018-11-11 ENCOUNTER — Encounter (HOSPITAL_COMMUNITY): Payer: Self-pay | Admitting: Emergency Medicine

## 2018-11-11 DIAGNOSIS — I2510 Atherosclerotic heart disease of native coronary artery without angina pectoris: Secondary | ICD-10-CM | POA: Diagnosis not present

## 2018-11-11 DIAGNOSIS — Z79899 Other long term (current) drug therapy: Secondary | ICD-10-CM | POA: Diagnosis not present

## 2018-11-11 DIAGNOSIS — R0602 Shortness of breath: Secondary | ICD-10-CM | POA: Diagnosis not present

## 2018-11-11 DIAGNOSIS — I110 Hypertensive heart disease with heart failure: Secondary | ICD-10-CM | POA: Diagnosis not present

## 2018-11-11 DIAGNOSIS — F17210 Nicotine dependence, cigarettes, uncomplicated: Secondary | ICD-10-CM | POA: Diagnosis not present

## 2018-11-11 DIAGNOSIS — J441 Chronic obstructive pulmonary disease with (acute) exacerbation: Secondary | ICD-10-CM

## 2018-11-11 DIAGNOSIS — I5032 Chronic diastolic (congestive) heart failure: Secondary | ICD-10-CM | POA: Insufficient documentation

## 2018-11-11 DIAGNOSIS — Z7982 Long term (current) use of aspirin: Secondary | ICD-10-CM | POA: Diagnosis not present

## 2018-11-11 DIAGNOSIS — I252 Old myocardial infarction: Secondary | ICD-10-CM | POA: Insufficient documentation

## 2018-11-11 DIAGNOSIS — R05 Cough: Secondary | ICD-10-CM | POA: Diagnosis not present

## 2018-11-11 HISTORY — DX: Acute myocardial infarction, unspecified: I21.9

## 2018-11-11 LAB — BASIC METABOLIC PANEL
ANION GAP: 8 (ref 5–15)
BUN: 7 mg/dL — ABNORMAL LOW (ref 8–23)
CALCIUM: 9.5 mg/dL (ref 8.9–10.3)
CO2: 36 mmol/L — AB (ref 22–32)
Chloride: 94 mmol/L — ABNORMAL LOW (ref 98–111)
Creatinine, Ser: 0.63 mg/dL (ref 0.44–1.00)
Glucose, Bld: 102 mg/dL — ABNORMAL HIGH (ref 70–99)
POTASSIUM: 3.8 mmol/L (ref 3.5–5.1)
Sodium: 138 mmol/L (ref 135–145)

## 2018-11-11 LAB — DIFFERENTIAL
BASOS ABS: 0.1 10*3/uL (ref 0.0–0.1)
BASOS PCT: 0 %
Eosinophils Absolute: 0 10*3/uL (ref 0.0–0.5)
Eosinophils Relative: 0 %
LYMPHS ABS: 3.5 10*3/uL (ref 0.7–4.0)
LYMPHS PCT: 20 %
MONO ABS: 1.2 10*3/uL — AB (ref 0.1–1.0)
MONOS PCT: 7 %
NEUTROS ABS: 12 10*3/uL — AB (ref 1.7–7.7)
Neutrophils Relative %: 71 %

## 2018-11-11 LAB — CBC
HEMATOCRIT: 41.7 % (ref 36.0–46.0)
Hemoglobin: 12.8 g/dL (ref 12.0–15.0)
MCH: 29.8 pg (ref 26.0–34.0)
MCHC: 30.7 g/dL (ref 30.0–36.0)
MCV: 97 fL (ref 80.0–100.0)
PLATELETS: 267 10*3/uL (ref 150–400)
RBC: 4.3 MIL/uL (ref 3.87–5.11)
RDW: 15.2 % (ref 11.5–15.5)
WBC: 16.9 10*3/uL — AB (ref 4.0–10.5)
nRBC: 0 % (ref 0.0–0.2)

## 2018-11-11 LAB — HEPATIC FUNCTION PANEL
ALBUMIN: 3.7 g/dL (ref 3.5–5.0)
ALT: 35 U/L (ref 0–44)
AST: 28 U/L (ref 15–41)
Alkaline Phosphatase: 67 U/L (ref 38–126)
BILIRUBIN DIRECT: 0.1 mg/dL (ref 0.0–0.2)
Indirect Bilirubin: 0.6 mg/dL (ref 0.3–0.9)
Total Bilirubin: 0.7 mg/dL (ref 0.3–1.2)
Total Protein: 6.4 g/dL — ABNORMAL LOW (ref 6.5–8.1)

## 2018-11-11 LAB — POCT I-STAT TROPONIN I: Troponin i, poc: 0 ng/mL (ref 0.00–0.08)

## 2018-11-11 MED ORDER — MORPHINE SULFATE (PF) 4 MG/ML IV SOLN
4.0000 mg | Freq: Once | INTRAVENOUS | Status: AC
  Administered 2018-11-11: 4 mg via INTRAVENOUS
  Filled 2018-11-11: qty 1

## 2018-11-11 MED ORDER — GUAIFENESIN-DM 100-10 MG/5ML PO SYRP: 10.0000 mL | ORAL_SOLUTION | Freq: Four times a day (QID) | ORAL | 0 refills | Status: DC | PRN

## 2018-11-11 MED ORDER — ALBUTEROL SULFATE (2.5 MG/3ML) 0.083% IN NEBU
2.5000 mg | INHALATION_SOLUTION | Freq: Once | RESPIRATORY_TRACT | Status: AC
  Administered 2018-11-11: 2.5 mg via RESPIRATORY_TRACT
  Filled 2018-11-11: qty 3

## 2018-11-11 MED ORDER — MAGNESIUM SULFATE 2 GM/50ML IV SOLN
2.0000 g | Freq: Once | INTRAVENOUS | Status: AC
  Administered 2018-11-11: 2 g via INTRAVENOUS
  Filled 2018-11-11: qty 50

## 2018-11-11 MED ORDER — DOXYCYCLINE HYCLATE 100 MG PO CAPS: 100.0000 mg | ORAL_CAPSULE | Freq: Two times a day (BID) | ORAL | 0 refills | Status: DC

## 2018-11-11 MED ORDER — IPRATROPIUM-ALBUTEROL 0.5-2.5 (3) MG/3ML IN SOLN
3.0000 mL | Freq: Once | RESPIRATORY_TRACT | Status: AC
  Administered 2018-11-11: 3 mL via RESPIRATORY_TRACT
  Filled 2018-11-11: qty 3

## 2018-11-11 MED ORDER — ALBUTEROL SULFATE (2.5 MG/3ML) 0.083% IN NEBU: 5.0000 mg | INHALATION_SOLUTION | Freq: Once | RESPIRATORY_TRACT | Status: DC

## 2018-11-11 MED ORDER — TRAMADOL HCL 50 MG PO TABS: 50.0000 mg | ORAL_TABLET | Freq: Four times a day (QID) | ORAL | 0 refills | Status: DC | PRN

## 2018-11-11 MED ORDER — METHYLPREDNISOLONE SODIUM SUCC 125 MG IJ SOLR
125.0000 mg | Freq: Once | INTRAMUSCULAR | Status: AC
  Administered 2018-11-11: 125 mg via INTRAVENOUS
  Filled 2018-11-11: qty 2

## 2018-11-11 NOTE — ED Triage Notes (Addendum)
Pt c/o lower back pain that began this morning. States when she moves the pain "takes my breath away." Pt c/o SOB, wheezing, and wet cough, pt wears 3L nasal cannula for COPD. HR 112.

## 2018-11-11 NOTE — Discharge Instructions (Addendum)
Increase your prednisone so you are taking 20 mg a day follow-up with your doctor next week.  Return to the emergency department if necessary

## 2018-11-11 NOTE — ED Provider Notes (Signed)
[facility] EMERGENCY DEPARTMENT Provider Note   CSN: [id number] Arrival date & time: 11/11/18  1450     History   Chief Complaint Chief Complaint  Patient presents with  . Shortness of Breath  . Back Pain    HPI Cindy [name] is a 62 y.o. female.  Patient complains of shortness of breath and cough.  The history is provided by the patient. No language interpreter was used.  Shortness of Breath  This is a recurrent problem. The problem occurs continuously.The current episode started more than 2 days ago. The problem has not changed since onset.Associated symptoms include wheezing. Pertinent negatives include no fever, no headaches, no cough, no chest pain, no abdominal pain and no rash. It is unknown what precipitated the problem. Risk factors: COPD. She has tried nothing for the symptoms. She has had prior hospitalizations. She has had prior ED visits. She has had no prior ICU admissions. Associated medical issues include pneumonia.  Back Pain   Pertinent negatives include no chest pain, no fever, no headaches and no abdominal pain.    Past Medical History:  Diagnosis Date  . 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 Arthritis   . CAD (coronary artery disease)    a. NSTEMI 07/2018 s/p difficult PCI with overlapping DES to LAD, residual OM2 disease treated medically.  . Chronic diastolic CHF (congestive heart failure) ([place])   . Chronic pain   . Chronic respiratory failure ([place]) On home 02 2-3L  . Compression fracture of lumbar vertebra ([place])   . COPD (chronic obstructive pulmonary disease) ([place])   . Essential hypertension   . Hyperglycemia, drug-induced   . Hyperlipidemia   . Kidney stones 2014   Left side, multiple  . Leukocytosis   . Myocardial infarct ([place])   . Osteoporosis 2013  . Thyroid disease    pt denies  . Tobacco abuse     Patient Active Problem List   Diagnosis Date Noted  . CAD (coronary artery disease) 10/05/2018  . Chronic diastolic CHF (congestive heart  failure) ([place])   . S/P PTCA (percutaneous transluminal coronary angioplasty)   . Orthopnea   . Non-ST elevation (NSTEMI) myocardial infarction ([place])   . Essential hypertension   . Acute on chronic respiratory failure ([place]) 08/12/2018  . Bilateral leg edema 08/11/2018  . Hypokalemia 08/11/2018  . Abnormal EKG 08/11/2018  . Diastolic dysfunction 61/60/7371  . Hypercarbia 08/10/2018  . 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 Leg swelling 04/15/2016  . Rash and nonspecific skin eruption 08/15/2014  . Ureteral stone with hydronephrosis 05/30/2013  . Allergic rhinitis 04/11/2013  . Ecchymosis 04/11/2013  . Kidney stones 04/11/2013  . Lumbar back pain 03/03/2013  . Compression fracture of L3 lumbar vertebra 03/03/2013  . Constipation 03/03/2013  . Sinus tachycardia 06/27/2012  . Osteopenia 03/16/2012  . GERD (gastroesophageal reflux disease) 03/16/2012  . Thrush 03/02/2012  . Chronic hypoxemic respiratory failure ([place]) 02/27/2012  . Hyperlipidemia 02/25/2012  . Hyperglycemia, drug-induced 09/25/2011  . Tobacco abuse 09/24/2011  . COPD (chronic obstructive pulmonary disease) ([place]) 12/24/2009    Past Surgical History:  Procedure Laterality Date  . CERVICAL BIOPSY    . COLONOSCOPY N/A 03/31/2013   Procedure: COLONOSCOPY;  Surgeon: Rogene Houston, MD;  Location: [facility];  Service: Endoscopy;  Laterality: N/A;  730  . CORONARY ATHERECTOMY N/A 08/19/2018   Procedure: CORONARY ATHERECTOMY;  Surgeon: Nelva Bush, MD;  Location: [place] CV LAB;  Service: Cardiovascular;  Laterality: N/A;  . CORONARY  STENT INTERVENTION N/A 08/19/2018   Procedure: CORONARY STENT INTERVENTION;  Surgeon: Nelva Bush, MD;  Location: [place] CV LAB;  Service: Cardiovascular;  Laterality: N/A;  . INTRAVASCULAR ULTRASOUND/IVUS N/A 08/19/2018   Procedure: Intravascular Ultrasound/IVUS;  Surgeon: Nelva Bush, MD;  Location: [place] CV LAB;  Service: Cardiovascular;  Laterality:  N/A;  . LEFT HEART CATH N/A 08/19/2018   Procedure: Left Heart Cath;  Surgeon: Nelva Bush, MD;  Location: [place] CV LAB;  Service: Cardiovascular;  Laterality: N/A;  . LEFT HEART CATH AND CORONARY ANGIOGRAPHY N/A 08/16/2018   Procedure: LEFT HEART CATH AND CORONARY ANGIOGRAPHY;  Surgeon: Nelva Bush, MD;  Location: [place] CV LAB;  Service: Cardiovascular;  Laterality: N/A;  . TUBAL LIGATION       OB History   None      Home Medications    Prior to Admission medications   Medication Sig Start Date End Date Taking? Authorizing Provider  albuterol (PROVENTIL HFA;VENTOLIN HFA) 108 (90 Base) MCG/ACT inhaler Inhale 2 puffs into the lungs every 6 (six) hours as needed for wheezing or shortness of breath. 05/18/18  Yes Byrum, Rose Fillers, MD  albuterol (PROVENTIL) (2.5 MG/3ML) 0.083% nebulizer solution INHALE 1 VIAL VIA NEBULIZER EVERY 6 HOURS AS NEEDED. 09/14/18  Yes [place], Modena Nunnery, MD  alendronate (FOSAMAX) 70 MG tablet TAKE 1 TAB BY MOUTH EVERY WEEK ON AN EMPTY STOMACH. 10/20/18  Yes [place], Modena Nunnery, MD  aspirin EC 81 MG tablet Take 81 mg by mouth every morning.    Yes [provider]  atorvastatin (LIPITOR) 40 MG tablet Take 1 tablet (40 mg total) by mouth daily. 09/13/18 09/08/19 Yes [place], Modena Nunnery, MD  budesonide-formoterol [facility]) 160-4.5 MCG/ACT inhaler Inhale 2 puffs into the lungs 2 (two) times daily. 04/27/18  Yes [place], Modena Nunnery, MD  Calcium Carb-Cholecalciferol 600-800 MG-UNIT TABS Take 1 tablet by mouth 2 (two) times daily.    Yes [provider]  clopidogrel (PLAVIX) 75 MG tablet Take 1 tablet (75 mg total) by mouth daily. 09/08/18  Yes Dunn, Dayna N, PA-C  diphenhydrAMINE (BENADRYL) 12.5 MG/5ML liquid Take 6.25 mg by mouth 4 (four) times daily as needed.   Yes [provider]  fluticasone (FLONASE) 50 MCG/ACT nasal spray INSTILL 1 SPRAY INTO EACH NOSTRIL DAILY AS NEEDED FOR ALLERGIES. 11/03/18  Yes [place], Modena Nunnery, MD  furosemide  (LASIX) 40 MG tablet Take 1 tablet (40 mg total) by mouth daily. Patient taking differently: Take 40-80 mg by mouth See admin instructions. Alternate taking 80mg  one day, then take 40mg  the next day, then repeat. 06/02/18  Yes [employer], Modena Nunnery, MD  metoprolol tartrate (LOPRESSOR) 25 MG tablet Take 0.5 tablets (12.5 mg total) by mouth 2 (two) times daily. 10/11/18  Yes Imogene Burn, PA-C  nitroGLYCERIN (NITROSTAT) 0.4 MG SL tablet Place 1 tablet (0.4 mg total) under the tongue every 5 (five) minutes as needed for chest pain. If no improvement with 3 doses call 911 08/27/18  Yes Delsa Grana, PA-C  nystatin (MYCOSTATIN) 100000 UNIT/ML suspension TAKE 5 ML BY MOUTH FOUR TIMES A DAY AS DIRECTED. 10/04/18  Yes [place], Modena Nunnery, MD  oxyCODONE-acetaminophen (PERCOCET/ROXICET) 5-325 MG tablet Take 1 tablet by mouth 2 (two) times daily. 10/28/18  Yes Susy Frizzle, MD  OXYGEN Inhale 3 L into the lungs continuous.    Yes [provider]  pantoprazole (PROTONIX) 40 MG tablet Take 1 tablet (40 mg total) by mouth 2 (two) times daily. 06/09/18  Yes [place], [place]  F, MD  polyethylene glycol (MIRALAX / GLYCOLAX) packet Take 17 g by mouth daily. 06/04/18  Yes Nanavati, Ankit, MD  potassium chloride SA (K-DUR,KLOR-CON) 20 MEQ tablet Take 1 tablet (20 mEq total) by mouth daily. 10/14/18  Yes Delsa Grana, PA-C  predniSONE (DELTASONE) 20 MG tablet Take 1 tablet (20 mg total) by mouth daily with breakfast. Patient taking differently: Take 10 mg by mouth daily with breakfast.  11/02/18  Yes [place], Modena Nunnery, MD  Tiotropium Bromide Monohydrate (SPIRIVA RESPIMAT) 2.5 MCG/ACT AERS INHALE 2 PUFFS INTO THE LUNGS ONCE DAILY Patient taking differently: Inhale 2 puffs into the lungs at bedtime.  05/28/18  Yes Delsa Grana, PA-C  doxycycline (VIBRAMYCIN) 100 MG capsule Take 1 capsule (100 mg total) by mouth 2 (two) times daily. One po bid x 7 days 11/11/18   Milton Ferguson, MD  guaiFENesin-dextromethorphan [facility]  DM) 100-10 MG/5ML syrup Take 10 mLs by mouth every 6 (six) hours as needed for cough. 11/11/18   Milton Ferguson, MD  traMADol (ULTRAM) 50 MG tablet Take 1 tablet (50 mg total) by mouth every 6 (six) hours as needed. 11/11/18   Milton Ferguson, MD    Family History Family History  Problem Relation Age of Onset  . Heart disease Mother   . Hypertension Sister   . Hypertension Brother     Social History Social History   Tobacco Use  . Smoking status: Current Some Day Smoker    Packs/day: 1.00    Years: 25.00    Pack years: 25.00    Types: Cigarettes  . Smokeless tobacco: Never Used  Substance Use Topics  . Alcohol use: No  . Drug use: No     Allergies   Keflex [cephalexin]; Sulfa antibiotics; Avelox [moxifloxacin hcl in nacl]; and Toradol [ketorolac tromethamine]   Review of Systems Review of Systems  Constitutional: Negative for appetite change, fatigue and fever.  HENT: Negative for congestion, ear discharge and sinus pressure.   Eyes: Negative for discharge.  Respiratory: Positive for shortness of breath and wheezing. Negative for cough.   Cardiovascular: Negative for chest pain.  Gastrointestinal: Negative for abdominal pain and diarrhea.  Genitourinary: Negative for frequency and hematuria.  Musculoskeletal: Positive for back pain.  Skin: Negative for rash.  Neurological: Negative for seizures and headaches.  Psychiatric/Behavioral: Negative for hallucinations.     Physical Exam Updated Vital Signs BP (!) 142/105 (BP Location: Right Arm)   Pulse (!) 120   Temp 98.4 F (36.9 C) (Oral)   Resp (!) 25   Ht 5\' 1"  (1.549 m)   Wt 86.6 kg   SpO2 95%   BMI 36.09 kg/m   Physical Exam  Constitutional: She is oriented to person, place, and time. She appears well-developed.  HENT:  Head: Normocephalic.  Eyes: Conjunctivae and EOM are normal. No scleral icterus.  Neck: Neck supple. No thyromegaly present.  Cardiovascular: Normal rate and regular rhythm. Exam  reveals no gallop and no friction rub.  No murmur heard. Pulmonary/Chest: No stridor. She has wheezes. She has no rales. She exhibits no tenderness.  Abdominal: She exhibits no distension. There is no tenderness. There is no rebound.  Musculoskeletal: Normal range of motion. She exhibits no edema.  Lymphadenopathy:    She has no cervical adenopathy.  Neurological: She is oriented to person, place, and time. She exhibits normal muscle tone. Coordination normal.  Skin: No rash noted. No erythema.  Psychiatric: She has a normal mood and affect. Her behavior is normal.  ED Treatments / Results  Labs (all labs ordered are listed, but only abnormal results are displayed) Labs Reviewed  CBC - Abnormal; Notable for the following components:      Result Value   WBC 16.9 (*)    All other components within normal limits  BASIC METABOLIC PANEL - Abnormal; Notable for the following components:   Chloride 94 (*)    CO2 36 (*)    Glucose, Bld 102 (*)    BUN 7 (*)    All other components within normal limits  HEPATIC FUNCTION PANEL - Abnormal; Notable for the following components:   Total Protein 6.4 (*)    All other components within normal limits  DIFFERENTIAL - Abnormal; Notable for the following components:   Neutro Abs 12.0 (*)    Monocytes Absolute 1.2 (*)    All other components within normal limits  I-STAT TROPONIN, ED  POCT I-STAT TROPONIN I    EKG EKG Interpretation  Date/Time:  Thursday November 11 2018 19:30:00 EST Ventricular Rate:  115 PR Interval:    QRS Duration: 108 QT Interval:  347 QTC Calculation: 480 R Axis:   160 Text Interpretation:  Sinus tachycardia Consider right atrial enlargement Right axis deviation Abnormal R-wave progression, early transition Baseline wander in lead(s) II III aVR aVL aVF V5 Confirmed by Milton Ferguson [phone]) on 11/11/2018 8:37:48 PM   Radiology Dg Chest 2 View  Result Date: 11/11/2018 CLINICAL DATA:  Acute shortness of breath  and cough EXAM: CHEST - 2 VIEW COMPARISON:  08/23/2018 and prior exams FINDINGS: The cardiomediastinal silhouette is unremarkable. Mild interstitial prominence is again noted. There is no evidence of focal airspace disease, pulmonary edema, suspicious pulmonary nodule/mass, pleural effusion, or pneumothorax. No acute bony abnormalities are identified. A remote LOWER thoracic compression fracture is again noted. IMPRESSION: No evidence of acute cardiopulmonary disease. Electronically Signed   By: Margarette Canada M.D.   On: 11/11/2018 15:27    Procedures Procedures (including critical care time)  Medications Ordered in ED Medications  albuterol (PROVENTIL) (2.5 MG/3ML) 0.083% nebulizer solution 5 mg (5 mg Nebulization Not Given 11/11/18 1846)  ipratropium-albuterol (DUONEB) 0.5-2.5 (3) MG/3ML nebulizer solution 3 mL (3 mLs Nebulization Given 11/11/18 1922)  albuterol (PROVENTIL) (2.5 MG/3ML) 0.083% nebulizer solution 2.5 mg (2.5 mg Nebulization Given 11/11/18 1922)  methylPREDNISolone sodium succinate (SOLU-MEDROL) 125 mg/2 mL injection 125 mg (125 mg Intravenous Given 11/11/18 1938)  magnesium sulfate IVPB 2 g 50 mL (2 g Intravenous New Bag/Given 11/11/18 2013)  morphine 4 MG/ML injection 4 mg (4 mg Intravenous Given 11/11/18 2052)     Initial Impression / Assessment and Plan / ED Course  I have reviewed the triage vital signs and the nursing notes.  Pertinent labs & imaging results that were available during my care of the patient were reviewed by me and considered in my medical decision making (see chart for details).     Patient with severe exacerbation of COPD.  Patient refuses to be admitted to the hospital.  It was explained to her that she may develop a serious pneumonia and possibly die.  Patient still states she will not stay in the hospital.  She will increase her prednisone to 20 mg a day she is given antibiotics pain medicine and will follow-up with her doctor  Final Clinical  Impressions(s) / ED Diagnoses   Final diagnoses:  COPD exacerbation [facility])    ED Discharge Orders         Ordered    doxycycline (VIBRAMYCIN)  100 MG capsule  2 times daily     11/11/18 2121    traMADol (ULTRAM) 50 MG tablet  Every 6 hours PRN     11/11/18 2121    guaiFENesin-dextromethorphan (ROBITUSSIN DM) 100-10 MG/5ML syrup  Every 6 hours PRN     11/11/18 2121           Milton Ferguson, MD 11/11/18 2128

## 2018-11-11 NOTE — Patient Outreach (Signed)
[place] [facility]) Care Management  11/11/2018  Cindy [name] 1956/06/08 [id number]   Successful outreach with Cindy [name]. Her primary concern was lower back pain. Stated pain is relieved with prescribed pain medications but reported decreased activity to avoid pain. Discussed medications, action plans and daily weights. Reported medication compliance. She denied complaints of chest discomfort but reported shortness of breath when ambulating. Reported edema to lower extremities and productive cough but was unable to provide weight range due to noncompliance with daily weights. She continues to remove nasal cannula to smoke, but unable to determine how long O2 is removed, and does not monitor O2 saturation. Remains uninterested in smoking cessation counseling or assistance. Discussed s/sx that require immediate medical attention.  Member verbalized understanding and reported that spouse was available to assist and transport if needed. Scheduled for Cardiology follow up in December. Member agreed to contact RNCM with questions if needed prior to next outreach.  PLAN Will continue routine outreach.  [place] [phone]

## 2018-11-12 ENCOUNTER — Other Ambulatory Visit: Payer: Medicare Other

## 2018-11-12 ENCOUNTER — Telehealth: Payer: Self-pay | Admitting: *Deleted

## 2018-11-12 NOTE — Patient Outreach (Signed)
[facility]) Care Management  11/12/2018  MARIGOLD MOM [birth year] [id number]   Call received from Ms. Lipson. Stated that she experienced severe back pain and shortness of breath on last night and was evaluated in the ED.  Reported that she "felt fine" today but has experienced back pain since returning home. MD recommendations and medications reviewed. Ms. Tallent inquired about need to take Magnesium post ED discharge. Stated she received it last night and was told she needed to take magnesium due to her levels being low. Inquired about dose that was needed for daily use. Per ED note she received Magnesium Sulfate via IVPB. No current order or recommendation for continued therapy. Unable to verify magnesium levels during ED visit. Call placed to MD office to schedule follow up visit and inquire about need for Magnesium. Ms. Rinkenberger informed that need for daily magnesium would be evaluated/discussed during scheduled follow up with PCP on 11/17/18.   PLAN Will continue routine outreach.   [place] [phone]

## 2018-11-12 NOTE — Telephone Encounter (Signed)
Received call from [place], [place].   Reports that patient was seen in ED for back pain/ COPD exacerbation. States that patient was given Mag Sulfate IV in ER and was told that she should use Magnesium daily for lower levels. Was not given instructions on how much magnesium to take.   Reviewed ED notes. No documented magnesium levels noted. Patient has follow up with PCP on Wed. Will address at that time.   Discussed care with provider. Advised that Magnesium was given as a smooth muscle relaxant to assist with breathing. Patient should not require daily supplementation.   [facility] RN to be made aware.

## 2018-11-14 NOTE — Patient Outreach (Signed)
[place] [facility]) Care Management  10/13/2018  SHARISA TOVES Jul 20, 1956 [id number]   Routine outreach with Ms. Kalisz. She reported compliance with medications and MD follow-ups.  Denied complaints of shortness of breath at rest but reported decreased activity tolerance and ability to perform ADLs. Reported her caregiver Louie Casa was able to obtain a bedside commode. Ms. Hollinshead denied need for additional assistance in the home and stated that Louie Casa was available to help as needed. Reported continued swelling to lower extremities and has used her rescue inhaler daily since last outreach. Member continues to smoke. She does not perform daily weights but stated that her weight was within range at last MD visit. She denied immediate needs and agreed to contact RNCM if needed prior to next outreach. [facility] CM Care Plan Problem One     Most Recent Value  Care Plan Problem One  High Risk for Readmission.  Role Documenting the Problem One  Care Management Coordinator  Care Plan for Problem One  Active  [facility]N Long Term Goal   Over the next 90 days patient will not have a hospitalization related to chronic disease complications.  [facility] Long Term Goal Start Date  09/13/18  Interventions for Problem One Long Term Goal  Reviewed medications, smoking cessation and COPD exacerbation triggers.  [facility] CM Short Term Goal #1   Over the next 30 days patient will take medications as prescribed.  [facility] CM Short Term Goal #1 Start Date  09/13/18  Interventions for Short Term Goal #1  Reviewed medications and indications for use. Patient with questions regarding need for atorvastatin and metoprolol.   [facility]N CM Short Term Goal #2   Over the next 30 days patient will weigh daily and maintain log.  [facility]N CM Short Term Goal #2 Start Date  09/13/18  Interventions for Short Term Goal #2  Encouraged patient to weigh daily. Patient reported weights have been within limits. [Reported noting weights during MD visits.]  [facility] CM Short  Term Goal #3  Over the next 30 day patient will engage in smoking cessation counseling or verbalize plan to stop smoking.  [facility]N CM Short Term Goal #3 Start Date  09/13/18  Interventions for Short Tern Goal #3  Discussed options and availability of telephonic smoking cessation counseling. Patient declined but agreed to notify RNCM if assistance with referral is needed.  [Pt continues to smoke daily but verbalized need to quit.]      PLAN Will continue routine outreach.   [place] [phone]

## 2018-11-15 ENCOUNTER — Other Ambulatory Visit: Payer: Self-pay

## 2018-11-15 DIAGNOSIS — R600 Localized edema: Secondary | ICD-10-CM

## 2018-11-15 DIAGNOSIS — M7989 Other specified soft tissue disorders: Secondary | ICD-10-CM

## 2018-11-15 DIAGNOSIS — J441 Chronic obstructive pulmonary disease with (acute) exacerbation: Secondary | ICD-10-CM

## 2018-11-15 DIAGNOSIS — I214 Non-ST elevation (NSTEMI) myocardial infarction: Secondary | ICD-10-CM

## 2018-11-15 NOTE — Addendum Note (Signed)
Addended by: Modesta Messing on: 11/15/2018 10:41 AM   Modules accepted: Orders

## 2018-11-17 ENCOUNTER — Other Ambulatory Visit: Payer: Self-pay | Admitting: Family Medicine

## 2018-11-17 ENCOUNTER — Other Ambulatory Visit: Payer: Self-pay

## 2018-11-17 ENCOUNTER — Ambulatory Visit (INDEPENDENT_AMBULATORY_CARE_PROVIDER_SITE_OTHER): Payer: Medicare Other | Admitting: Family Medicine

## 2018-11-17 ENCOUNTER — Encounter: Payer: Self-pay | Admitting: Family Medicine

## 2018-11-17 VITALS — BP 130/74 | HR 92 | Temp 98.5°F | Resp 18 | Ht 61.5 in | Wt 192.0 lb

## 2018-11-17 DIAGNOSIS — G8929 Other chronic pain: Secondary | ICD-10-CM | POA: Diagnosis not present

## 2018-11-17 DIAGNOSIS — J441 Chronic obstructive pulmonary disease with (acute) exacerbation: Secondary | ICD-10-CM

## 2018-11-17 DIAGNOSIS — I10 Essential (primary) hypertension: Secondary | ICD-10-CM

## 2018-11-17 DIAGNOSIS — J9611 Chronic respiratory failure with hypoxia: Secondary | ICD-10-CM

## 2018-11-17 DIAGNOSIS — Z720 Tobacco use: Secondary | ICD-10-CM | POA: Diagnosis not present

## 2018-11-17 DIAGNOSIS — J411 Mucopurulent chronic bronchitis: Secondary | ICD-10-CM | POA: Diagnosis not present

## 2018-11-17 DIAGNOSIS — I2510 Atherosclerotic heart disease of native coronary artery without angina pectoris: Secondary | ICD-10-CM | POA: Diagnosis not present

## 2018-11-17 DIAGNOSIS — K219 Gastro-esophageal reflux disease without esophagitis: Secondary | ICD-10-CM

## 2018-11-17 MED ORDER — MAGNESIUM 250 MG PO TABS: ORAL_TABLET | ORAL | 0 refills | Status: DC

## 2018-11-17 MED ORDER — GUAIFENESIN-CODEINE 100-10 MG/5ML PO SOLN: 5.0000 mL | Freq: Four times a day (QID) | ORAL | 0 refills | Status: DC | PRN

## 2018-11-17 NOTE — Patient Instructions (Addendum)
Doxycline antibiotic morning and evening calcum pill at lunch until you finish antibiotics, then go back to twice a day  Magnesium take 250mg  once a day at lunch Use the nasal saline three times a day  Robitussin codiene given F/U 2 weeks

## 2018-11-17 NOTE — Progress Notes (Signed)
   Subjective:    Patient ID: Cindy [name], female    DOB: 07/18/1956, 62 y.o.   MRN: [id number]  Patient presents for ER follow up for COPD exacerbation, she was had back pain along with the cough/SOB.She refused admission She was prescribed prednisone 20mg   and doxycyline , her maintanance is 10mg  a day  Request refill on her codiene cough medication She is confused on when to take her antibiotics and other medication Continues to smoke   SHe is on Plavix secondary to CAD, she had a lot of bruising with [place], but today she asks to be moved back to [place], discussed why this was not a good option at this time  Told her magnesium level low was  2.2 in August states she was given mag in her fluids   Diastolic heart failure- currently on lasix    CAD- currnetly on plavix     Review Of Systems:  GEN- denies fatigue, fever, weight loss,weakness, recent illness HEENT- denies eye drainage, change in vision, nasal discharge, CVS- denies chest pain, palpitations RESP- denies SOB, cough, wheeze ABD- denies N/V, change in stools, abd pain GU- denies dysuria, hematuria, dribbling, incontinence MSK- denies joint pain, muscle aches, injury Neuro- denies headache, dizziness, syncope, seizure activity       Objective:    BP 130/74   Pulse 92   Temp 98.5 F (36.9 C) (Oral)   Resp 18   Ht 5' 1.5" (1.562 m)   Wt 192 lb (87.1 kg)   SpO2 100% Comment: ON 3L/MIN VIA [place]  BMI 35.69 kg/m  GEN- NAD, alert and oriented x3 HEENT- PERRL, EOMI, non injected sclera, pink conjunctiva, MMM, oropharynx clear Neck- Supple, no thyromegaly CVS- RRR, no murmur RESP-CTAB ABD-NABS,soft,NT,ND EXT- No edema Pulses- Radial, DP- 2+        Assessment & Plan:      Problem List Items Addressed This Visit      Unprioritized   CAD (coronary artery disease)    Discussed importance of plavix BP controlled She is on statin drug       Chronic pain    Recurrent compression fractures She  has ultram recently given at ER she will complete this but plans to stay with the oxycodone-acetaminophen aftewards  Given robitssin codiene for her COPD, she understands not take with any of the oral narcotics       COPD (chronic obstructive pulmonary disease) (HCC)    End stage, she is tapering back down on her prednisone Take Doxy morning and evening Take calcium and mag at lunch for now and then resume BID once antibiotics completed Continue nebs Continue oxygen therapy Doesn't want to quit smoking      Relevant Medications   guaiFENesin-codeine 100-10 MG/5ML syrup   Essential hypertension    Blood pressure controlled      Tobacco abuse - Primary (Chronic)    Other Visit Diagnoses    Hypomagnesemia       She is on 250mg  once a day for now      Note: This dictation was prepared with Dragon dictation along with smaller phrase technology. Any transcriptional errors that result from this process are unintentional.

## 2018-11-18 ENCOUNTER — Encounter: Payer: Self-pay | Admitting: Family Medicine

## 2018-11-18 ENCOUNTER — Telehealth: Payer: Self-pay | Admitting: *Deleted

## 2018-11-18 DIAGNOSIS — G8929 Other chronic pain: Secondary | ICD-10-CM | POA: Insufficient documentation

## 2018-11-18 NOTE — Assessment & Plan Note (Signed)
Blood pressure controlled. 

## 2018-11-18 NOTE — Telephone Encounter (Signed)
Received call from patient.   Reports that she woke up this morning with burst blood vessel in R eye. States that eye is extremely red, but is not draining any fluid.   Requested order for ABTx gtts. Advised that if there is no green or yellow drainage or crusts, she does not require ABTx gtts. Advised to use lubricating drops to soothe eye, but it will need to heal on it's own.   Advised if eye worsens or vision loss occurs to notify office or go to ER.

## 2018-11-18 NOTE — Assessment & Plan Note (Signed)
End stage, she is tapering back down on her prednisone Take Doxy morning and evening Take calcium and mag at lunch for now and then resume BID once antibiotics completed Continue nebs Continue oxygen therapy Doesn't want to quit smoking

## 2018-11-18 NOTE — Assessment & Plan Note (Signed)
Recurrent compression fractures She has ultram recently given at ER she will complete this but plans to stay with the oxycodone-acetaminophen aftewards  Given robitssin codiene for her COPD, she understands not take with any of the oral narcotics

## 2018-11-18 NOTE — Assessment & Plan Note (Signed)
Discussed importance of plavix BP controlled She is on statin drug

## 2018-11-19 ENCOUNTER — Telehealth: Payer: Self-pay | Admitting: Family Medicine

## 2018-11-19 MED ORDER — FUROSEMIDE 40 MG PO TABS: ORAL_TABLET | ORAL | 3 refills | Status: DC

## 2018-11-19 NOTE — Telephone Encounter (Signed)
Prescription sent to pharmacy.

## 2018-11-19 NOTE — Telephone Encounter (Signed)
A blood vessel can burst in the eye from coughing IF NO PAIN or drainage, no change in vision this will go away in a few weeks, on its own If any above symptoms needs to be seen

## 2018-11-19 NOTE — Telephone Encounter (Signed)
Refill lasix to [facility] with corrected instructions PT IS OUT!!!

## 2018-11-23 ENCOUNTER — Other Ambulatory Visit: Payer: Self-pay

## 2018-11-23 ENCOUNTER — Other Ambulatory Visit: Payer: Self-pay | Admitting: *Deleted

## 2018-11-23 MED ORDER — PREDNISONE 10 MG PO TABS: 10.0000 mg | ORAL_TABLET | Freq: Every day | ORAL | 3 refills | Status: DC

## 2018-11-23 NOTE — Patient Outreach (Signed)
[facility]) Care Management  11/23/2018  Cindy [name] 1956-11-01 [id number]   Initial outreach to patient regarding social work referral for assistance with obtaining medical equipment/hospital bed.  BSW encouraged Ms. Vannatter to contact her insurance provider regarding what medical equipment is covered.  BSW explained to her that an MD order is required otherwise it will not be covered by insurance.  She acknowledged that she cannot afford to pay out-of-pocket for medical equipment.  BSW informed her that the [facility] in [facility] has a Paramedic.  BSW offered to provide her with contact information but she declined.  BSW is closing at this time.  Ronn Melena, BSW Social Worker [phone]

## 2018-11-29 ENCOUNTER — Other Ambulatory Visit: Payer: Self-pay

## 2018-11-29 NOTE — Patient Outreach (Signed)
[facility]) Care Management  11/29/2018  Cindy [name] 25-Mar-1956 [id number]   Successful outreach with Cindy [name]. Attended follow up with PCP as scheduled. Reported compliance with medications but does not weigh daily. Reported last weight several days ago was 190lbs. Reported bruising and edema to both lower extremities. No complaints of chest discomfort. Denied shortness of breath at rest but continues to experience shortness of breath when performing ADLs. Denied falls. Cindy [name] denied urgent concerns and reported that she will follow up with her PCP on Friday. Agreed to contact RNCM with questions if needed.  THN CM Care Plan Problem One     Most Recent Value  Care Plan Problem One  High Risk for Readmission.  Role Documenting the Problem One  Care Management Coordinator  Care Plan for Problem One  Active  THN Long Term Goal   Over the next 90 days patient will not have a hospitalization related to chronic disease complications.  [facility]N Long Term Goal Start Date  09/13/18  [facility] CM Short Term Goal #1   Over the next 30 days patient will take medications as prescribed.  [facility]N CM Short Term Goal #1 Start Date  09/13/18  THN CM Short Term Goal #1 Met Date  10/25/18  THN CM Short Term Goal #2   Over the next 30 days patient will weigh daily and maintain log.  THN CM Short Term Goal #2 Start Date  09/13/18  Interventions for Short Term Goal #2  Educated patient on importance of weighing daily at consistend time. Encouraged to maintain daily log and monitor for overnight weight gain    THN CM Short Term Goal #3  Over the next 30 day patient will engage in smoking cessation counseling or verbalize plan to stop smoking.  THN CM Short Term Goal #3 Start Date  09/13/18      PLAN Will continue routine outreach.  [place] [phone]

## 2018-12-03 ENCOUNTER — Ambulatory Visit: Payer: Medicare Other | Admitting: Family Medicine

## 2018-12-13 ENCOUNTER — Encounter: Payer: Self-pay | Admitting: Cardiovascular Disease

## 2018-12-13 ENCOUNTER — Ambulatory Visit (INDEPENDENT_AMBULATORY_CARE_PROVIDER_SITE_OTHER): Payer: Medicare Other | Admitting: Cardiovascular Disease

## 2018-12-13 VITALS — BP 138/80 | HR 103 | Ht 61.0 in | Wt 193.6 lb

## 2018-12-13 DIAGNOSIS — I10 Essential (primary) hypertension: Secondary | ICD-10-CM | POA: Diagnosis not present

## 2018-12-13 DIAGNOSIS — I25118 Atherosclerotic heart disease of native coronary artery with other forms of angina pectoris: Secondary | ICD-10-CM | POA: Diagnosis not present

## 2018-12-13 DIAGNOSIS — Z955 Presence of coronary angioplasty implant and graft: Secondary | ICD-10-CM | POA: Diagnosis not present

## 2018-12-13 DIAGNOSIS — I5032 Chronic diastolic (congestive) heart failure: Secondary | ICD-10-CM | POA: Diagnosis not present

## 2018-12-13 DIAGNOSIS — I350 Nonrheumatic aortic (valve) stenosis: Secondary | ICD-10-CM | POA: Diagnosis not present

## 2018-12-13 DIAGNOSIS — I2510 Atherosclerotic heart disease of native coronary artery without angina pectoris: Secondary | ICD-10-CM | POA: Diagnosis not present

## 2018-12-13 DIAGNOSIS — E785 Hyperlipidemia, unspecified: Secondary | ICD-10-CM

## 2018-12-13 MED ORDER — ATORVASTATIN CALCIUM 80 MG PO TABS: 80.0000 mg | ORAL_TABLET | Freq: Every day | ORAL | 3 refills | Status: DC

## 2018-12-13 NOTE — Progress Notes (Signed)
SUBJECTIVE: The patient presents for routine follow-up.  She has coronary artery disease, oxygen dependent COPD, hypertension, hyperlipidemia, tobacco abuse, and chronic pain.  She sustained a non-STEMI on 08/11/2018.  Echocardiogram at that time demonstrated normal left ventricular systolic function, LVEF 26%, with hypokinesis of the mid apical anteroseptal myocardium.  There was mild aortic stenosis.  Cardiac cath in 8/19 demonstrated two-vessel CAD with 90% LAD and 60 to 70% OM 2, LVEF 45 to 50%.  LVEDP 25-30.  She was unable to lie flat so she was diuresed and then taken back to the Cath Lab 08/19/2018 and underwent complex but successful PCI to the ostial and proximal LAD with orbital atherectomy with IVUS overlapping stents.  This was complicated by abrupt closure of the proximal LAD during arthrectomy which resolved following stent placement.  Limited echo 08/20/2018 LVEF 55 to 60% with improvement in wall motion.  She has multiple diffuse somatic complaints.  She complains of right lower extremity swelling but does not want to take higher doses of diuretics due to frequent urination.  She complains of redness in her right eye and nasal congestion.  She complains of easy bruisability.    Review of Systems: As per "subjective", otherwise negative.  Allergies  Allergen Reactions  . Keflex [Cephalexin] Anaphylaxis and Rash  . Sulfa Antibiotics   . Avelox [Moxifloxacin Hcl In Nacl] Itching and Rash  . Toradol [Ketorolac Tromethamine] Swelling, Rash and Other (See Comments)    Bruising, swelling, rash at injection site    Current Outpatient Medications  Medication Sig Dispense Refill  . albuterol (PROVENTIL HFA;VENTOLIN HFA) 108 (90 Base) MCG/ACT inhaler Inhale 2 puffs into the lungs every 6 (six) hours as needed for wheezing or shortness of breath. 1 Inhaler 5  . albuterol (PROVENTIL) (2.5 MG/3ML) 0.083% nebulizer solution INHALE 1 VIAL VIA NEBULIZER EVERY 6 HOURS AS NEEDED. 375  mL 0  . alendronate (FOSAMAX) 70 MG tablet TAKE 1 TAB BY MOUTH EVERY WEEK ON AN EMPTY STOMACH. 4 tablet 3  . aspirin EC 81 MG tablet Take 81 mg by mouth every morning.     Marland Kitchen atorvastatin (LIPITOR) 40 MG tablet Take 1 tablet (40 mg total) by mouth daily. 90 tablet 3  . Calcium Carb-Cholecalciferol 600-800 MG-UNIT TABS Take 1 tablet by mouth 2 (two) times daily.     . clopidogrel (PLAVIX) 75 MG tablet Take 1 tablet (75 mg total) by mouth daily. 90 tablet 3  . diphenhydrAMINE (BENADRYL) 12.5 MG/5ML liquid Take 6.25 mg by mouth 4 (four) times daily as needed.    . fluticasone (FLONASE) 50 MCG/ACT nasal spray INSTILL 1 SPRAY INTO EACH NOSTRIL DAILY AS NEEDED FOR ALLERGIES. 16 g 0  . furosemide (LASIX) 40 MG tablet Alternate taking 80mg  one day, then take 40mg  the next day, then repeat. 135 tablet 3  . guaiFENesin-dextromethorphan (ROBITUSSIN DM) 100-10 MG/5ML syrup Take 10 mLs by mouth every 6 (six) hours as needed for cough. 236 mL 0  . Magnesium 250 MG TABS Take 1 tablet daily  0  . metoprolol tartrate (LOPRESSOR) 25 MG tablet Take 0.5 tablets (12.5 mg total) by mouth 2 (two) times daily. 30 tablet 11  . nitroGLYCERIN (NITROSTAT) 0.4 MG SL tablet Place 1 tablet (0.4 mg total) under the tongue every 5 (five) minutes as needed for chest pain. If no improvement with 3 doses call 911 50 tablet 3  . nystatin (MYCOSTATIN) 100000 UNIT/ML suspension TAKE 5 ML BY MOUTH FOUR TIMES A DAY AS DIRECTED.  240 mL 0  . oxyCODONE-acetaminophen (PERCOCET/ROXICET) 5-325 MG tablet Take 1 tablet by mouth 2 (two) times daily. 30 tablet 0  . OXYGEN Inhale 3 L into the lungs continuous.     . pantoprazole (PROTONIX) 40 MG tablet TAKE ONE TABLET BY MOUTH TWICE A DAY 180 tablet 2  . polyethylene glycol (MIRALAX / GLYCOLAX) packet Take 17 g by mouth daily. 14 each 0  . potassium chloride SA (K-DUR,KLOR-CON) 20 MEQ tablet TAKE 1 TABLET BY MOUTH ONCE A DAY. 90 tablet 0  . predniSONE (DELTASONE) 10 MG tablet Take 1 tablet (10 mg  total) by mouth daily with breakfast. 30 tablet 3  . SPIRIVA RESPIMAT 2.5 MCG/ACT AERS INHALE 2 PUFFS INTO THE LUNGS ONCE DAILY. 12 g 0  . SYMBICORT 160-4.5 MCG/ACT inhaler INHALE 2 PUFFS INTO THE LUNGS TWICE DAILY. 30.6 g 2  . traMADol (ULTRAM) 50 MG tablet Take 1 tablet (50 mg total) by mouth every 6 (six) hours as needed. 20 tablet 0   No current facility-administered medications for this visit.     Past Medical History:  Diagnosis Date  . 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 Arthritis   . CAD (coronary artery disease)    a. NSTEMI 07/2018 s/p difficult PCI with overlapping DES to LAD, residual OM2 disease treated medically.  . Chronic diastolic CHF (congestive heart failure) ([place])   . Chronic pain   . Chronic respiratory failure ([place]) On home 02 2-3L  . Compression fracture of lumbar vertebra ([place])   . COPD (chronic obstructive pulmonary disease) ([place])   . Essential hypertension   . Hyperglycemia, drug-induced   . Hyperlipidemia   . Kidney stones 2014   Left side, multiple  . Leukocytosis   . Myocardial infarct ([place])   . Osteoporosis 2013  . Thyroid disease    pt denies  . Tobacco abuse     Past Surgical History:  Procedure Laterality Date  . CERVICAL BIOPSY    . COLONOSCOPY N/A 03/31/2013   Procedure: COLONOSCOPY;  Surgeon: Rogene Houston, MD;  Location: [facility];  Service: Endoscopy;  Laterality: N/A;  730  . CORONARY ATHERECTOMY N/A 08/19/2018   Procedure: CORONARY ATHERECTOMY;  Surgeon: Nelva Bush, MD;  Location: [place] [facility];  Service: Cardiovascular;  Laterality: N/A;  . CORONARY STENT INTERVENTION N/A 08/19/2018   Procedure: CORONARY STENT INTERVENTION;  Surgeon: Nelva Bush, MD;  Location: [place] [facility];  Service: Cardiovascular;  Laterality: N/A;  . INTRAVASCULAR ULTRASOUND/IVUS N/A 08/19/2018   Procedure: Intravascular Ultrasound/IVUS;  Surgeon: Nelva Bush, MD;  Location: [place] [facility];  Service: Cardiovascular;  Laterality: N/A;  .  LEFT HEART CATH N/A 08/19/2018   Procedure: Left Heart Cath;  Surgeon: Nelva Bush, MD;  Location: [place] [facility];  Service: Cardiovascular;  Laterality: N/A;  . LEFT HEART CATH AND CORONARY ANGIOGRAPHY N/A 08/16/2018   Procedure: LEFT HEART CATH AND CORONARY ANGIOGRAPHY;  Surgeon: Nelva Bush, MD;  Location: [place] [facility];  Service: Cardiovascular;  Laterality: N/A;  . TUBAL LIGATION      Social History   Socioeconomic History  . Marital status: Divorced    Spouse name: Not on file  . Number of children: 3  . Years of education: Not on file  . Highest education level: Not on file  Occupational History  . Occupation: disabled    Employer: DISABLED  Social Needs  . Financial resource strain: Not on file  . Food insecurity:    Worry:  Not on file    Inability: Not on file  . Transportation needs:    Medical: Not on file    Non-medical: Not on file  Tobacco Use  . Smoking status: Current Some Day Smoker    Packs/day: 1.00    Years: 25.00    Pack years: 25.00    Types: Cigarettes  . Smokeless tobacco: Never Used  Substance and Sexual Activity  . Alcohol use: No  . Drug use: No  . Sexual activity: Not Currently    Birth control/protection: Surgical  Lifestyle  . Physical activity:    Days per week: Not on file    Minutes per session: Not on file  . Stress: Not on file  Relationships  . Social connections:    Talks on phone: Not on file    Gets together: Not on file    Attends religious service: Not on file    Active member of club or organization: Not on file    Attends meetings of clubs or organizations: Not on file    Relationship status: Not on file  . Intimate partner violence:    Fear of current or ex partner: Not on file    Emotionally abused: Not on file    Physically abused: Not on file    Forced sexual activity: Not on file  Other Topics Concern  . Not on file  Social History Narrative  . Not on file     Vitals:   12/13/18 1021  BP:  138/80  Pulse: (!) 103  SpO2: 97%  Weight: 193 lb 9.6 oz (87.8 kg)  Height: 5\' 1"  (1.549 m)    Wt Readings from Last 3 Encounters:  12/13/18 193 lb 9.6 oz (87.8 kg)  11/17/18 192 lb (87.1 kg)  11/11/18 191 lb (86.6 kg)     PHYSICAL EXAM General: NAD, chronically ill-appearing HEENT: Mild right eye conjunctival congestion. Neck: No JVD, no thyromegaly. Lungs: Poor air movement throughout with diffuse bilateral rhonchi and wheezes CV: Regular rate and rhythm, normal S1/S2, no S3/S4, no murmur.  Mild left leg edema.     Abdomen: Soft, nontender, no distention.  Neurologic: Alert and oriented.  Psych: Normal affect. Skin: Normal. Musculoskeletal: No gross deformities.    ECG: Reviewed above under Subjective   Labs: Lab Results  Component Value Date/Time   K 3.8 11/11/2018 07:40 PM   BUN 7 (L) 11/11/2018 07:40 PM   CREATININE 0.63 11/11/2018 07:40 PM   CREATININE 0.73 10/14/2018 11:01 AM   ALT 35 11/11/2018 07:40 PM   TSH 2.323 08/27/2018 04:06 PM   TSH 1.124 07/11/2013 09:12 AM   HGB 12.8 11/11/2018 07:40 PM     Lipids: Lab Results  Component Value Date/Time   LDLCALC 81 10/14/2018 09:26 AM   LDLCALC 117 (H) 10/13/2017 10:01 AM   CHOL 157 10/14/2018 09:26 AM   TRIG 126 10/14/2018 09:26 AM   HDL 51 10/14/2018 09:26 AM       ASSESSMENT AND PLAN: 1.  Coronary artery disease: Symptomatically stable.  Continue dual antiplatelet therapy with aspirin and Plavix.  Brilinta was discontinued due to significant bruising.  Continue Lopressor and atorvastatin.  I will increase atorvastatin 80 mg.  2.  Chronic diastolic heart failure: Euvolemic on alternating doses of Lasix 40 and 80 mg.  She has some right leg swelling and I talked to her about increasing the diuretic dosage but she refused due to complaints of frequent urination.  Blood pressure is normal.  3.  Hypertension:  Blood pressure is normal.  No changes.  4.  Hyperlipidemia: LDL 81 on 10/14/2018.  Currently on  atorvastatin 40 mg.  I will increase to 80 mg for further LDL reduction.  5.  Aortic stenosis: Mild in severity.  I will monitor clinically and with surveillance echocardiography.   Disposition: Follow up 6 months   Kate Sable, M.D., F.A.C.C.

## 2018-12-13 NOTE — Patient Instructions (Signed)
Medication Instructions:  INCREASE Lipitor to 80 mg daily at dinner If you need a refill on your cardiac medications before your next appointment, please call your pharmacy.   Lab work: None If you have labs (blood work) drawn today and your tests are completely normal, you will receive your results only by: Marland Kitchen MyChart Message (if you have MyChart) OR . A paper copy in the mail If you have any lab test that is abnormal or we need to change your treatment, we will call you to review the results.  Testing/Procedures: None  Follow-Up: 6 months with Estella Husk PA-C  At [facility], you and your health needs are our priority.  As part of our continuing mission to provide you with exceptional heart care, we have created designated Provider Care Teams.  These Care Teams include your primary Cardiologist (physician) and Advanced Practice Providers (APPs -  Physician Assistants and Nurse Practitioners) who all work together to provide you with the care you need, when you need it.

## 2018-12-20 ENCOUNTER — Other Ambulatory Visit: Payer: Self-pay

## 2018-12-20 NOTE — Patient Outreach (Signed)
[facility]) Care Management  12/20/2018  [name] 15-Jul-1956 [id number]   Successful outreach with [name]. She reported feeling well. No complaints of pain or shortness of breath. Reported cough with "clear" sputum. No complaints of chest discomfort. Reported decreased activity tolerance but ambulating well in the home. Cindy [name] stated that she is attending appointments as scheduled and taking medications as prescribed. Reported not weighing in over a week. Last recorded weight was 193lbs.   Reviewed care management needs. [name] denied urgent needs but requested assistance with obtaining a new pulse oximeter. Will forward request and reevaluate for transition to health coach on next month.  PLAN Will follow up on next month.  [place] [phone]

## 2018-12-24 ENCOUNTER — Telehealth: Payer: Self-pay | Admitting: *Deleted

## 2018-12-24 NOTE — Telephone Encounter (Signed)
Received call from patient.   Reports that she has decreased O2 sats (82%) and HR is elevated (140). States that she has edema and the worst case of thrush she has ever had.   Advised to go to ER ASAP.

## 2018-12-24 NOTE — Telephone Encounter (Signed)
Agree with above 

## 2019-01-12 ENCOUNTER — Telehealth: Payer: Self-pay | Admitting: Family Medicine

## 2019-01-12 NOTE — Telephone Encounter (Signed)
Pt called stating that she is having a lot of nasal congestion and wants to be seen. We do not have anything and she said she would just go to er. I informed her that she did not need to go to ER for nasal congestion. Infomred her that she can take Coricidin HBP, nasal saline spray, use a humidifier in room and no sudafed. Also pt has Flonase - informed she can use that daily as well. Offered apt for tomorrow and pt refused stating that she needed to be seen now. However after informing of medications she can take otc she agreed to try them instead of going to ER.

## 2019-01-12 NOTE — Telephone Encounter (Signed)
Patient would like refill on flonase pill if possible  Please call into [facility]  Also would like for you to call her about possibly being prescribd prednisone [phone]

## 2019-01-13 ENCOUNTER — Ambulatory Visit (INDEPENDENT_AMBULATORY_CARE_PROVIDER_SITE_OTHER): Payer: Medicare Other | Admitting: Family Medicine

## 2019-01-13 ENCOUNTER — Encounter: Payer: Self-pay | Admitting: Family Medicine

## 2019-01-13 VITALS — BP 130/90 | HR 143 | Temp 98.1°F | Resp 23 | Wt 191.4 lb

## 2019-01-13 DIAGNOSIS — I5032 Chronic diastolic (congestive) heart failure: Secondary | ICD-10-CM

## 2019-01-13 DIAGNOSIS — R000 Tachycardia, unspecified: Secondary | ICD-10-CM | POA: Diagnosis not present

## 2019-01-13 DIAGNOSIS — J9611 Chronic respiratory failure with hypoxia: Secondary | ICD-10-CM | POA: Diagnosis not present

## 2019-01-13 DIAGNOSIS — B370 Candidal stomatitis: Secondary | ICD-10-CM

## 2019-01-13 DIAGNOSIS — J9620 Acute and chronic respiratory failure, unspecified whether with hypoxia or hypercapnia: Secondary | ICD-10-CM | POA: Diagnosis not present

## 2019-01-13 DIAGNOSIS — J411 Mucopurulent chronic bronchitis: Secondary | ICD-10-CM | POA: Diagnosis not present

## 2019-01-13 DIAGNOSIS — J019 Acute sinusitis, unspecified: Secondary | ICD-10-CM

## 2019-01-13 DIAGNOSIS — I2510 Atherosclerotic heart disease of native coronary artery without angina pectoris: Secondary | ICD-10-CM

## 2019-01-13 MED ORDER — DOXYCYCLINE HYCLATE 100 MG PO TABS: 100.0000 mg | ORAL_TABLET | Freq: Two times a day (BID) | ORAL | 0 refills | Status: AC

## 2019-01-13 MED ORDER — FLUCONAZOLE 150 MG PO TABS: 150.0000 mg | ORAL_TABLET | Freq: Once | ORAL | 0 refills | Status: AC

## 2019-01-13 MED ORDER — FLUTICASONE PROPIONATE 50 MCG/ACT NA SUSP: NASAL | 0 refills | Status: DC

## 2019-01-13 MED ORDER — PREDNISONE 20 MG PO TABS: ORAL_TABLET | ORAL | 0 refills | Status: DC

## 2019-01-13 NOTE — Telephone Encounter (Signed)
Patient has been triaged.   Prescription sent to pharmacy.

## 2019-01-13 NOTE — Patient Instructions (Addendum)
Use your 02 with mask when you cannot breath through your nose  Goal for pulse Ox is > 90, usually 90-95% is good for you, and if its higher you can decrease the O2 flow from 3 to 2 or 1.  If you have high heart rate, or Low pulse Ox, despite using oxygen and doing breathing treatments and steroids - call 911 or go to the ER.  Do not put anything in your nose except flonase  Do not take sudafed or mucinex DM

## 2019-01-13 NOTE — Progress Notes (Signed)
Patient ID: Cindy [name], female    DOB: November 17, 1956, 63 y.o.   MRN: [id number]  PCP: Alycia Rossetti, MD  Chief Complaint  Patient presents with  . Nasal Congestion    Patient in today with c/o nasal congestion, also has c/o head congestion    Subjective:   Cindy [name] is a 63 y.o. female, presents to clinic with CC of nasal congestion, worse breathing and wheeze.  O2 with nasal canula she cannot get due to totally congested and obstructed nose, with severe sinus congestion and pressure although she denies any pain or headaches..  She has been using no spray but does not know which one.  She also states she called in requesting another spray but when I looked up the medications and told her Flonase was called and she says she cannot use it because it gives her sores in her nose.  She also states she has been putting something inside of her nose that is a prescribed cream although I do not know what it is called and patient cannot recall.  She did call in yesterday and was advised to use Coricidin saline nasal spray and humidifier.  She believes she took the Coricidin, she was informed not to use any Sudafed but she has been using multiple decongestants including Sudafed and other cough medicines with cough DM. Patient states she continues to do her breathing treatments.  She states that minimal exertion causes her heart rate to be very fast and her to have significant shortness of breath.  She does continue to use 3 L via nasal cannula and she has not had to increase this.  She also states that she is last saw her PCP in November when she was was to give her a larger dose of steroids but she only has daily 10 mg prescribed to her.  Patient continues to smoke, she has called multiple times over the past several months complaining of worsening breathing, tachycardia, hypoxia she has been advised to go to the ER multiple times but she has not been to the ER.  As previously noted she is a very  difficult historian, she endorses today that she is taking all of her inhalers as prescribed, doing additional rescue breathing treatments about 3 times a day and the last one was this morning.  She continues to request steroid injections here in clinic to help her improve.  Does not seem that she is taking the prescribed dose of Lasix due to difficulty getting to the restroom with urinary frequency.  She denies any acute weight gain.    Wt Readings from Last 5 Encounters:  01/13/19 191 lb 6 oz (86.8 kg)  12/13/18 193 lb 9.6 oz (87.8 kg)  11/17/18 192 lb (87.1 kg)  11/11/18 191 lb (86.6 kg)  10/14/18 190 lb (86.2 kg)   She denies any fever chills sweats body aches nausea vomiting.   Patient Active Problem List   Diagnosis Date Noted  . Chronic pain 11/18/2018  . CAD (coronary artery disease) 10/05/2018  . Chronic diastolic CHF (congestive heart failure) ([place])   . S/P PTCA (percutaneous transluminal coronary angioplasty)   . Orthopnea   . Non-ST elevation (NSTEMI) myocardial infarction ([place])   . Essential hypertension   . Acute on chronic respiratory failure ([place]) 08/12/2018  . Bilateral leg edema 08/11/2018  . Hypokalemia 08/11/2018  . Abnormal EKG 08/11/2018  . Diastolic dysfunction 54/00/8676  . Hypercarbia 08/10/2018  . 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  Leg swelling 04/15/2016  . Rash and nonspecific skin eruption 08/15/2014  . Ureteral stone with hydronephrosis 05/30/2013  . Allergic rhinitis 04/11/2013  . Ecchymosis 04/11/2013  . Kidney stones 04/11/2013  . Lumbar back pain 03/03/2013  . Compression fracture of L3 lumbar vertebra 03/03/2013  . Constipation 03/03/2013  . Sinus tachycardia 06/27/2012  . Osteopenia 03/16/2012  . GERD (gastroesophageal reflux disease) 03/16/2012  . Thrush 03/02/2012  . Chronic hypoxemic respiratory failure ([place]) 02/27/2012  . Hyperlipidemia 02/25/2012  . Hyperglycemia, drug-induced 09/25/2011  . Tobacco abuse 09/24/2011  .  COPD (chronic obstructive pulmonary disease) ([place]) 12/24/2009     Prior to Admission medications   Medication Sig Start Date End Date Taking? Authorizing Provider  albuterol (PROVENTIL HFA;VENTOLIN HFA) 108 (90 Base) MCG/ACT inhaler Inhale 2 puffs into the lungs every 6 (six) hours as needed for wheezing or shortness of breath. 05/18/18  Yes [place], Rose Fillers, MD  albuterol (PROVENTIL) (2.5 MG/3ML) 0.083% nebulizer solution INHALE 1 VIAL VIA NEBULIZER EVERY 6 HOURS AS NEEDED. 09/14/18  Yes [place], Modena Nunnery, MD  alendronate (FOSAMAX) 70 MG tablet TAKE 1 TAB BY MOUTH EVERY WEEK ON AN EMPTY STOMACH. 10/20/18  Yes [place], Modena Nunnery, MD  aspirin EC 81 MG tablet Take 81 mg by mouth every morning.    Yes [provider]  atorvastatin (LIPITOR) 80 MG tablet Take 1 tablet (80 mg total) by mouth daily. 12/13/18 12/08/19 Yes Herminio Commons, MD  Calcium Carb-Cholecalciferol 600-800 MG-UNIT TABS Take 1 tablet by mouth 2 (two) times daily.    Yes [provider]  clopidogrel (PLAVIX) 75 MG tablet Take 1 tablet (75 mg total) by mouth daily. 09/08/18  Yes Dunn, Dayna N, PA-C  diphenhydrAMINE (BENADRYL) 12.5 MG/5ML liquid Take 6.25 mg by mouth 4 (four) times daily as needed.   Yes [provider]  fluticasone (FLONASE) 50 MCG/ACT nasal spray INSTILL 1 SPRAY INTO EACH NOSTRIL DAILY AS NEEDED FOR ALLERGIES. 01/13/19  Yes [place], Modena Nunnery, MD  furosemide (LASIX) 40 MG tablet Alternate taking 80mg  one day, then take 40mg  the next day, then repeat. 11/19/18  Yes [place], Modena Nunnery, MD  guaiFENesin-dextromethorphan [facility] DM) 100-10 MG/5ML syrup Take 10 mLs by mouth every 6 (six) hours as needed for cough. 11/11/18  Yes Milton Ferguson, MD  Magnesium 250 MG TABS Take 1 tablet daily 11/17/18  Yes [place], Modena Nunnery, MD  metoprolol tartrate (LOPRESSOR) 25 MG tablet Take 0.5 tablets (12.5 mg total) by mouth 2 (two) times daily. 10/11/18  Yes Imogene Burn, PA-C  nystatin (MYCOSTATIN)  100000 UNIT/ML suspension TAKE 5 ML BY MOUTH FOUR TIMES A DAY AS DIRECTED. 10/04/18  Yes [place], Modena Nunnery, MD  oxyCODONE-acetaminophen (PERCOCET/ROXICET) 5-325 MG tablet Take 1 tablet by mouth 2 (two) times daily. 10/28/18  Yes Susy Frizzle, MD  OXYGEN Inhale 3 L into the lungs continuous.    Yes [provider]  pantoprazole (PROTONIX) 40 MG tablet TAKE ONE TABLET BY MOUTH TWICE A DAY 11/17/18  Yes [place], Modena Nunnery, MD  polyethylene glycol [facility] / GLYCOLAX) packet Take 17 g by mouth daily. 06/04/18  Yes Varney Biles, MD  potassium chloride SA (K-DUR,KLOR-CON) 20 MEQ tablet TAKE 1 TABLET BY MOUTH ONCE A DAY. 11/17/18  Yes [place], Modena Nunnery, MD  predniSONE (DELTASONE) 10 MG tablet Take 1 tablet (10 mg total) by mouth daily with breakfast. 11/23/18  Yes [place], Modena Nunnery, MD  SPIRIVA RESPIMAT 2.5 MCG/ACT AERS INHALE 2 PUFFS INTO THE LUNGS ONCE DAILY. 11/17/18  Yes [place], Modena Nunnery, MD  SYMBICORT 160-4.5 MCG/ACT inhaler INHALE 2 PUFFS INTO THE LUNGS TWICE DAILY. 11/17/18  Yes [place], Modena Nunnery, MD  traMADol (ULTRAM) 50 MG tablet Take 1 tablet (50 mg total) by mouth every 6 (six) hours as needed. 11/11/18  Yes Milton Ferguson, MD  nitroGLYCERIN (NITROSTAT) 0.4 MG SL tablet Place 1 tablet (0.4 mg total) under the tongue every 5 (five) minutes as needed for chest pain. If no improvement with 3 doses call 911 Patient not taking: Reported on 01/13/2019 08/27/18   Delsa Grana, PA-C     Allergies  Allergen Reactions  . Keflex [Cephalexin] Anaphylaxis and Rash  . Sulfa Antibiotics   . Avelox [Moxifloxacin Hcl In Nacl] Itching and Rash  . Toradol [Ketorolac Tromethamine] Swelling, Rash and Other (See Comments)    Bruising, swelling, rash at injection site     Family History  Problem Relation Age of Onset  . Heart disease Mother   . Hypertension Sister   . Hypertension Brother      Social History   Socioeconomic History  . Marital status: Divorced    Spouse name: Not on file   . Number of children: 3  . Years of education: Not on file  . Highest education level: Not on file  Occupational History  . Occupation: disabled    Employer: DISABLED  Social Needs  . Financial resource strain: Not on file  . Food insecurity:    Worry: Not on file    Inability: Not on file  . Transportation needs:    Medical: Not on file    Non-medical: Not on file  Tobacco Use  . Smoking status: Current Some Day Smoker    Packs/day: 1.00    Years: 25.00    Pack years: 25.00    Types: Cigarettes  . Smokeless tobacco: Never Used  Substance and Sexual Activity  . Alcohol use: No  . Drug use: No  . Sexual activity: Not Currently    Birth control/protection: Surgical  Lifestyle  . Physical activity:    Days per week: Not on file    Minutes per session: Not on file  . Stress: Not on file  Relationships  . Social connections:    Talks on phone: Not on file    Gets together: Not on file    Attends religious service: Not on file    Active member of club or organization: Not on file    Attends meetings of clubs or organizations: Not on file    Relationship status: Not on file  . Intimate partner violence:    Fear of current or ex partner: Not on file    Emotionally abused: Not on file    Physically abused: Not on file    Forced sexual activity: Not on file  Other Topics Concern  . Not on file  Social History Narrative  . Not on file     Review of Systems  Constitutional: Negative.   HENT: Negative.   Eyes: Negative.   Respiratory: Negative.   Cardiovascular: Negative.   Gastrointestinal: Negative.   Endocrine: Negative.   Genitourinary: Negative.   Musculoskeletal: Negative.   Skin: Negative.   Allergic/Immunologic: Negative.   Neurological: Negative.   Hematological: Negative.   Psychiatric/Behavioral: Negative.   All other systems reviewed and are negative.      Objective:    Vitals:   01/13/19 1408  BP: 130/90  Pulse: (!) 143  Resp: (!) 23    Temp: 98.1 F (  36.7 C)  TempSrc: Oral  SpO2: 92%  Weight: 191 lb 6 oz (86.8 kg)    HR palpated manually 118 2:51 PM SpO2 98% on 3L with mask  Physical Exam Vitals signs and nursing note reviewed.  Constitutional:      General: She is not in acute distress.    Appearance: She is obese. She is ill-appearing. She is not toxic-appearing or diaphoretic.  HENT:     Head: Normocephalic and atraumatic.     Right Ear: External ear normal.     Left Ear: External ear normal.     Nose: Mucosal edema and congestion present. No rhinorrhea.     Right Nostril: Occlusion present.     Left Nostril: Occlusion present.     Right Sinus: Maxillary sinus tenderness and frontal sinus tenderness present.     Left Sinus: Maxillary sinus tenderness and frontal sinus tenderness present.     Mouth/Throat:     Mouth: Mucous membranes are moist.     Pharynx: Oropharynx is clear. No posterior oropharyngeal erythema.  Eyes:     General: No scleral icterus.       Right eye: No discharge.        Left eye: No discharge.     Conjunctiva/sclera: Conjunctivae normal.     Pupils: Pupils are equal, round, and reactive to light.  Cardiovascular:     Rate and Rhythm: Tachycardia present.  No extrasystoles are present.    Pulses:          Radial pulses are 1+ on the right side and 1+ on the left side.     Heart sounds: Heart sounds are distant. No murmur. No friction rub. No gallop.      Comments: HR 118 Pulmonary:     Effort: Tachypnea and prolonged expiration present.     Breath sounds: Decreased breath sounds and wheezing present.     Comments: Pt grunting with exhalation, but when conversing she can stop grunting Has portable O2 with 3L via [place] Abdominal:     General: Bowel sounds are normal. There is no distension.     Palpations: Abdomen is soft.     Tenderness: There is no abdominal tenderness. There is no guarding or rebound.  Neurological:     Mental Status: She is alert.  Psychiatric:         Attention and Perception: She is inattentive.        Mood and Affect: Mood is anxious.        Speech: Speech is tangential.        Behavior: Behavior is uncooperative.     Comments: Impaired cognition and memory Cannot understand and discuss one topic, repeats the same questions, seems to have no ability to focus on on problem,or one plan of treatment           Assessment & Plan:   Very difficult pt, with complex med hx, presents with sinusitis - nares b/l obstructed.  She has been putting unknown medication - a cream in nose ? And has not been complaint with other medication advise given to her.    Initial VS showed tachycardia >140- she was tachy, tachypneic, her heart was extremely dificult to hear and assess, and she was shaking a groaning/grunting and pulses were very difficult to palpate and count HR to recheck.  I got the pulse Ox and she then had a HR of 44 and Sp02 of 85%.  I adjusted her nasal cannula tubing to a Ventimask her  high flow oxygen mask since her nares were basically obstructed bilaterally to cannot breathe through her nose, with the mask her pulse ox increased to 98% and we are able to decrease flow to 2 L and she still maintained over 96%.   I rechecked her heart rate 5 to 10 minutes later after she had time to rest in the room and it was 105-118, which is more her baseline.  She has had this longstanding tachycardia and I am unsure if it is from cardiac etiology she has been seeing cardiology since her MI and she states they do about the problem since her cardiac rehab.  She also has chronic respiratory failure, she does not seem to understand the nature of her illness.  She has been noncompliant with advice from the physicians and nurses here at this office who instructed her to use saline spray for her nose and to avoid multiple medications which she is taking which are better for her heart and BP.    I have tried to talk with her about changing to the mask and how to  watch her oxygenation and pulse ox but the patient cannot focus long enough to have me finish 1-2 sentences I have written down the information for her, I have tried to review with her and her friend in the room what her heart rate and pulse ox should be at and what is concerning and what is too high and when to seek help however she has been told to go to the ER multiple times in the past couple months and she has not gone I attempted to express to her how this could be mental to her health with morbidity mortality but I am very concerned that she simply does not understand.  The nurse had tried to stay in the room and explained same concepts and vital sign parameter to her.    I have put in for palliative care perhaps palliative care consult would build to help patient manage her chronic diseases and have better quality of life.  I do feel like she needs some kind of competency testing however I will defer to her PCP for this.  Patient was given a slightly larger steroid burst and was told to complete this and return to her 10 mg prednisone daily if that is what she is currently on.  Doxycycline to treat sinusitis.  Instructed to not put anything in her mouth except for saline nasal spray and Flonase.  She still asked "what them I can I do so I can breathe?"  And again told her we are going to treat her for sinus infection and hope that this improves her nasal patency.      ICD-10-CM   1. Acute non-recurrent sinusitis, unspecified location J01.90 predniSONE (DELTASONE) 20 MG tablet  2. Tachycardia R00.0 EKG 12-Lead   go to the ER for evaluation if heart rate >140's at rest She refuses EKG here, HR varied 44-140's, now 110  3. Acute on chronic respiratory failure, unspecified whether with hypoxia or hypercapnia (HCC) J96.20 predniSONE (DELTASONE) 20 MG tablet    Amb Referral to Palliative Care  4. Thrush B37.0 fluconazole (DIFLUCAN) 150 MG tablet  5. Chronic diastolic CHF (congestive heart failure)  (HCC) I50.32 Amb Referral to Palliative Care  6. Coronary artery disease involving native coronary artery of native heart without angina pectoris I25.10 Amb Referral to Palliative Care  7. Chronic hypoxemic respiratory failure (HCC) J96.11 Amb Referral to Palliative Care  8. Mucopurulent  chronic bronchitis ([place]) J41.1 Amb Referral to Palliative Care      Delsa Grana, PA-C 01/13/19 2:25 PM

## 2019-01-17 ENCOUNTER — Telehealth: Payer: Self-pay | Admitting: *Deleted

## 2019-01-17 ENCOUNTER — Other Ambulatory Visit: Payer: Self-pay

## 2019-01-17 ENCOUNTER — Ambulatory Visit: Payer: Self-pay

## 2019-01-17 ENCOUNTER — Encounter: Payer: Self-pay | Admitting: Family Medicine

## 2019-01-17 NOTE — Telephone Encounter (Signed)
Received call from [facility] [place] [phone] telephone.   States that patient has referral for Palliative Care, but there are questions.   Call placed to [facility] to inquire.   [place].

## 2019-01-17 NOTE — Patient Outreach (Signed)
[facility]) Care Management  01/17/2019  Cindy [name] 02/09/1956 [id number]   Case closure due to member's transition to [facility]. Referral received following member's evaluation in PCP office last week. Cindy [name] requested clarification regarding difference between [place]. She denied changes in care management needs and reported that her caregiver Cindy [name] is available to assist and provide information as needed. Cindy [name] verbalized awareness of her condition and agreeable to outreach from palliative care staff. Initial assessment pending.  PLAN Will follow up with PCP office. Will completed case closure.   [place] [phone]

## 2019-01-18 ENCOUNTER — Telehealth: Payer: Self-pay | Admitting: Family Medicine

## 2019-01-18 NOTE — Telephone Encounter (Signed)
Received return call from [place].   Reports that patient is being referred to [facility] and further information is required. States that Fredric Mare is requesting demographics, insurance information, H&P, recent labs, medication lists and the last (2) chart notes to be faxed to [facility] @ [phone].

## 2019-01-18 NOTE — Telephone Encounter (Signed)
[place]   Mask was only provided for pt to be able to breath her supplemental O2 with her severely stuffy nose (ie: [place] will not help her when she can't breath through her nose).   Using the mask or [place] - she can keep the 3L O2 flow the same.  Tell her only to worry about if her SpO2 is <90% or if shes having a hard time breathing.  She can do the flonase and finish the antibiotics.  Flonase takes more than a week of using daily for it to work.   She can do saline, but nothing else in nose.  Follow up with her PCP if she doesn't gradually improve in the next 1-2 weeks.

## 2019-01-18 NOTE — Telephone Encounter (Signed)
Patient called with c/o of nasal congestion and not being able to breathe out of her nose. She states that she has 1 more pill on the doxycycline and has seen no improvements with symptoms. She states that she now has white mucus coming out of her nose and she has tried saline drops along side the Doxycycline and symptoms are no better. (off note She also was requesting a non rebreathing mask and reiterated with her that if her 02 is at 90-93 with her COPD that this ok. She states it was 26 and I informed her that it was not necessary). She is requesting another antibiotic. Please advise?

## 2019-01-19 NOTE — Telephone Encounter (Signed)
Spoke with patient and instructed her to do as listed below by Delsa Grana, PA. Patient verbalized understanding.

## 2019-01-20 ENCOUNTER — Telehealth: Payer: Self-pay | Admitting: *Deleted
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
## 2019-01-21 ENCOUNTER — Ambulatory Visit: Payer: Self-pay | Admitting: Family Medicine

## 2019-01-21 NOTE — Telephone Encounter (Signed)
Noted, needs appt for re-evaluation

## 2019-01-22 ENCOUNTER — Encounter (HOSPITAL_COMMUNITY): Payer: Self-pay | Admitting: Emergency Medicine

## 2019-01-22 ENCOUNTER — Emergency Department (HOSPITAL_COMMUNITY): Payer: Medicare Other

## 2019-01-22 ENCOUNTER — Emergency Department (HOSPITAL_COMMUNITY)
Admission: EM | Admit: 2019-01-22 | Discharge: 2019-01-22 | Disposition: A | Discharge status: Home / Self Care | Payer: Medicare Other | Attending: Emergency Medicine | Admitting: Emergency Medicine

## 2019-01-22 ENCOUNTER — Other Ambulatory Visit: Payer: Self-pay

## 2019-01-22 DIAGNOSIS — I2510 Atherosclerotic heart disease of native coronary artery without angina pectoris: Secondary | ICD-10-CM | POA: Diagnosis not present

## 2019-01-22 DIAGNOSIS — R0981 Nasal congestion: Secondary | ICD-10-CM | POA: Diagnosis not present

## 2019-01-22 DIAGNOSIS — R000 Tachycardia, unspecified: Secondary | ICD-10-CM | POA: Diagnosis not present

## 2019-01-22 DIAGNOSIS — Z9981 Dependence on supplemental oxygen: Secondary | ICD-10-CM | POA: Insufficient documentation

## 2019-01-22 DIAGNOSIS — J441 Chronic obstructive pulmonary disease with (acute) exacerbation: Secondary | ICD-10-CM | POA: Diagnosis not present

## 2019-01-22 DIAGNOSIS — I5032 Chronic diastolic (congestive) heart failure: Secondary | ICD-10-CM | POA: Insufficient documentation

## 2019-01-22 DIAGNOSIS — R0602 Shortness of breath: Secondary | ICD-10-CM | POA: Diagnosis not present

## 2019-01-22 DIAGNOSIS — Z79899 Other long term (current) drug therapy: Secondary | ICD-10-CM | POA: Diagnosis not present

## 2019-01-22 DIAGNOSIS — Z7982 Long term (current) use of aspirin: Secondary | ICD-10-CM | POA: Insufficient documentation

## 2019-01-22 DIAGNOSIS — F17210 Nicotine dependence, cigarettes, uncomplicated: Secondary | ICD-10-CM | POA: Insufficient documentation

## 2019-01-22 DIAGNOSIS — G8929 Other chronic pain: Secondary | ICD-10-CM | POA: Insufficient documentation

## 2019-01-22 DIAGNOSIS — I110 Hypertensive heart disease with heart failure: Secondary | ICD-10-CM | POA: Diagnosis not present

## 2019-01-22 DIAGNOSIS — R600 Localized edema: Secondary | ICD-10-CM | POA: Insufficient documentation

## 2019-01-22 DIAGNOSIS — M545 Low back pain: Secondary | ICD-10-CM | POA: Diagnosis not present

## 2019-01-22 LAB — COMPREHENSIVE METABOLIC PANEL
ALT: 44 U/L (ref 0–44)
AST: 31 U/L (ref 15–41)
Albumin: 3.7 g/dL (ref 3.5–5.0)
Alkaline Phosphatase: 68 U/L (ref 38–126)
Anion gap: 11 (ref 5–15)
BUN: 13 mg/dL (ref 8–23)
CO2: 37 mmol/L — AB (ref 22–32)
Calcium: 9.2 mg/dL (ref 8.9–10.3)
Chloride: 88 mmol/L — ABNORMAL LOW (ref 98–111)
Creatinine, Ser: 0.87 mg/dL (ref 0.44–1.00)
GFR calc Af Amer: >60 mL/min (ref >60)
GFR calc non Af Amer: >60 mL/min (ref >60)
Glucose, Bld: 200 mg/dL — ABNORMAL HIGH (ref 70–99)
POTASSIUM: 4.2 mmol/L (ref 3.5–5.1)
Sodium: 136 mmol/L (ref 135–145)
Total Bilirubin: 0.4 mg/dL (ref 0.3–1.2)
Total Protein: 6.5 g/dL (ref 6.5–8.1)

## 2019-01-22 LAB — CBC WITH DIFFERENTIAL/PLATELET
Abs Immature Granulocytes: 0.11 10*3/uL — ABNORMAL HIGH (ref 0.00–0.07)
Basophils Absolute: 0 10*3/uL (ref 0.0–0.1)
Basophils Relative: 0 %
EOS ABS: 0 10*3/uL (ref 0.0–0.5)
Eosinophils Relative: 0 %
HCT: 42.5 % (ref 36.0–46.0)
Hemoglobin: 12.7 g/dL (ref 12.0–15.0)
IMMATURE GRANULOCYTES: 1 %
Lymphocytes Relative: 8 %
Lymphs Abs: 1.4 10*3/uL (ref 0.7–4.0)
MCH: 28.6 pg (ref 26.0–34.0)
MCHC: 29.9 g/dL — ABNORMAL LOW (ref 30.0–36.0)
MCV: 95.7 fL (ref 80.0–100.0)
Monocytes Absolute: 0.7 10*3/uL (ref 0.1–1.0)
Monocytes Relative: 4 %
Neutro Abs: 14.7 10*3/uL — ABNORMAL HIGH (ref 1.7–7.7)
Neutrophils Relative %: 87 %
PLATELETS: 286 10*3/uL (ref 150–400)
RBC: 4.44 MIL/uL (ref 3.87–5.11)
RDW: 15 % (ref 11.5–15.5)
WBC: 16.9 10*3/uL — ABNORMAL HIGH (ref 4.0–10.5)
nRBC: 0 % (ref 0.0–0.2)

## 2019-01-22 LAB — URINALYSIS, ROUTINE W REFLEX MICROSCOPIC
Bacteria, UA: NONE SEEN
Bilirubin Urine: NEGATIVE
Glucose, UA: NEGATIVE mg/dL
KETONES UR: NEGATIVE mg/dL
Leukocytes, UA: NEGATIVE
Nitrite: NEGATIVE
PROTEIN: NEGATIVE mg/dL
Specific Gravity, Urine: 1.008 (ref 1.005–1.030)
pH: 7 (ref 5.0–8.0)

## 2019-01-22 LAB — BRAIN NATRIURETIC PEPTIDE: B Natriuretic Peptide: 21 pg/mL (ref 0.0–100.0)

## 2019-01-22 LAB — TROPONIN I: Troponin I: <0.03 ng/mL (ref <0.03)

## 2019-01-22 MED ORDER — MORPHINE SULFATE (PF) 2 MG/ML IV SOLN
2.0000 mg | Freq: Once | INTRAVENOUS | Status: AC
  Administered 2019-01-22: 2 mg via INTRAVENOUS
  Filled 2019-01-22: qty 1

## 2019-01-22 MED ORDER — LEVALBUTEROL HCL 1.25 MG/0.5ML IN NEBU
1.2500 mg | INHALATION_SOLUTION | Freq: Once | RESPIRATORY_TRACT | Status: AC
  Administered 2019-01-22: 1.25 mg via RESPIRATORY_TRACT
  Filled 2019-01-22: qty 0.5

## 2019-01-22 MED ORDER — AZITHROMYCIN 250 MG PO TABS: 250.0000 mg | ORAL_TABLET | Freq: Every day | ORAL | 0 refills | Status: DC

## 2019-01-22 MED ORDER — LORATADINE 10 MG PO TABS: 10.0000 mg | ORAL_TABLET | Freq: Every day | ORAL | 0 refills | Status: DC

## 2019-01-22 MED ORDER — METHYLPREDNISOLONE SODIUM SUCC 125 MG IJ SOLR
125.0000 mg | Freq: Once | INTRAMUSCULAR | Status: AC
  Administered 2019-01-22: 125 mg via INTRAVENOUS
  Filled 2019-01-22: qty 2

## 2019-01-22 NOTE — ED Notes (Signed)
To Rad 

## 2019-01-22 NOTE — ED Provider Notes (Signed)
[facility] EMERGENCY DEPARTMENT Provider Note   CSN: [id number] Arrival date & time: 01/22/19  1454     History   Chief Complaint Chief Complaint  Patient presents with  . Shortness of Breath    HPI Cindy [name] is a 63 y.o. female.  HPI Patient with history of COPD who wears 3 L oxygen by nasal cannula continuously.  She is had several weeks of bilateral nasal congestion, cough, wheezing and shortness of breath.  She is been seen by her primary physician and is completed a course of doxycycline which she does not believe improved her symptoms.  She denies any fever or chills.  She has chronic lower back pain.  No recent trauma.  She is requesting a "shot of morphine" for this pain. Past Medical History:  Diagnosis Date  . 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 Arthritis   . CAD (coronary artery disease)    a. NSTEMI 07/2018 s/p difficult PCI with overlapping DES to LAD, residual OM2 disease treated medically.  . Chronic diastolic CHF (congestive heart failure) ([place])   . Chronic pain   . Chronic respiratory failure ([place]) On home 02 2-3L  . Compression fracture of lumbar vertebra ([place])   . COPD (chronic obstructive pulmonary disease) ([place])   . Essential hypertension   . Hyperglycemia, drug-induced   . Hyperlipidemia   . Kidney stones 2014   Left side, multiple  . Leukocytosis   . Myocardial infarct ([place])   . Osteoporosis 2013  . Thyroid disease    pt denies  . Tobacco abuse     Patient Active Problem List   Diagnosis Date Noted  . Chronic pain 11/18/2018  . CAD (coronary artery disease) 10/05/2018  . Chronic diastolic CHF (congestive heart failure) ([place])   . S/P PTCA (percutaneous transluminal coronary angioplasty)   . Orthopnea   . Non-ST elevation (NSTEMI) myocardial infarction ([place])   . Essential hypertension   . Acute on chronic respiratory failure ([place]) 08/12/2018  . Bilateral leg edema 08/11/2018  . Hypokalemia 08/11/2018  . Abnormal EKG 08/11/2018  .  Diastolic dysfunction 66/05/3015  . Hypercarbia 08/10/2018  . 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 Leg swelling 04/15/2016  . Rash and nonspecific skin eruption 08/15/2014  . Ureteral stone with hydronephrosis 05/30/2013  . Allergic rhinitis 04/11/2013  . Ecchymosis 04/11/2013  . Kidney stones 04/11/2013  . Lumbar back pain 03/03/2013  . Compression fracture of L3 lumbar vertebra 03/03/2013  . Constipation 03/03/2013  . Sinus tachycardia 06/27/2012  . Osteopenia 03/16/2012  . GERD (gastroesophageal reflux disease) 03/16/2012  . Thrush 03/02/2012  . Chronic hypoxemic respiratory failure ([place]) 02/27/2012  . Hyperlipidemia 02/25/2012  . Hyperglycemia, drug-induced 09/25/2011  . Tobacco abuse 09/24/2011  . COPD (chronic obstructive pulmonary disease) ([place]) 12/24/2009    Past Surgical History:  Procedure Laterality Date  . CERVICAL BIOPSY    . COLONOSCOPY N/A 03/31/2013   Procedure: COLONOSCOPY;  Surgeon: Rogene Houston, MD;  Location: [facility] SUITE;  Service: Endoscopy;  Laterality: N/A;  730  . CORONARY ATHERECTOMY N/A 08/19/2018   Procedure: CORONARY ATHERECTOMY;  Surgeon: Nelva Bush, MD;  Location: [place] CV LAB;  Service: Cardiovascular;  Laterality: N/A;  . CORONARY STENT INTERVENTION N/A 08/19/2018   Procedure: CORONARY STENT INTERVENTION;  Surgeon: Nelva Bush, MD;  Location: [place] CV LAB;  Service: Cardiovascular;  Laterality: N/A;  . INTRAVASCULAR ULTRASOUND/IVUS N/A 08/19/2018   Procedure: Intravascular Ultrasound/IVUS;  Surgeon: Nelva Bush, MD;  Location: [place] CV  LAB;  Service: Cardiovascular;  Laterality: N/A;  . LEFT HEART CATH N/A 08/19/2018   Procedure: Left Heart Cath;  Surgeon: Nelva Bush, MD;  Location: [place] CV LAB;  Service: Cardiovascular;  Laterality: N/A;  . LEFT HEART CATH AND CORONARY ANGIOGRAPHY N/A 08/16/2018   Procedure: LEFT HEART CATH AND CORONARY ANGIOGRAPHY;  Surgeon: Nelva Bush, MD;  Location:  [place] CV LAB;  Service: Cardiovascular;  Laterality: N/A;  . TUBAL LIGATION       OB History   No obstetric history on file.      Home Medications    Prior to Admission medications   Medication Sig Start Date End Date Taking? Authorizing Provider  albuterol (PROVENTIL HFA;VENTOLIN HFA) 108 (90 Base) MCG/ACT inhaler Inhale 2 puffs into the lungs every 6 (six) hours as needed for wheezing or shortness of breath. 05/18/18  Yes [place], Rose Fillers, MD  albuterol (PROVENTIL) (2.5 MG/3ML) 0.083% nebulizer solution INHALE 1 VIAL VIA NEBULIZER EVERY 6 HOURS AS NEEDED. 09/14/18  Yes [place], Modena Nunnery, MD  alendronate (FOSAMAX) 70 MG tablet TAKE 1 TAB BY MOUTH EVERY WEEK ON AN EMPTY STOMACH. 10/20/18  Yes [place], Modena Nunnery, MD  aspirin EC 81 MG tablet Take 81 mg by mouth every morning.    Yes [provider]  atorvastatin (LIPITOR) 80 MG tablet Take 1 tablet (80 mg total) by mouth daily. 12/13/18 12/08/19 Yes Herminio Commons, MD  Calcium Carb-Cholecalciferol 600-800 MG-UNIT TABS Take 1 tablet by mouth 2 (two) times daily.    Yes [provider]  clopidogrel (PLAVIX) 75 MG tablet Take 1 tablet (75 mg total) by mouth daily. 09/08/18  Yes Dunn, Dayna N, PA-C  diphenhydrAMINE (BENADRYL) 12.5 MG/5ML liquid Take 6.25 mg by mouth 4 (four) times daily as needed.   Yes [provider]  fluticasone (FLONASE) 50 MCG/ACT nasal spray INSTILL 1 SPRAY INTO EACH NOSTRIL DAILY AS NEEDED FOR ALLERGIES. 01/13/19  Yes [place], Modena Nunnery, MD  furosemide (LASIX) 40 MG tablet Alternate taking 80mg  one day, then take 40mg  the next day, then repeat. 11/19/18  Yes [place], Modena Nunnery, MD  guaiFENesin-dextromethorphan [facility] DM) 100-10 MG/5ML syrup Take 10 mLs by mouth every 6 (six) hours as needed for cough. 11/11/18  Yes Milton Ferguson, MD  Magnesium 250 MG TABS Take 1 tablet daily 11/17/18  Yes [place], Modena Nunnery, MD  metoprolol tartrate (LOPRESSOR) 25 MG tablet Take 0.5 tablets (12.5 mg  total) by mouth 2 (two) times daily. 10/11/18  Yes Imogene Burn, PA-C  nystatin (MYCOSTATIN) 100000 UNIT/ML suspension TAKE 5 ML BY MOUTH FOUR TIMES A DAY AS DIRECTED. 10/04/18  Yes [place], Modena Nunnery, MD  oxyCODONE-acetaminophen (PERCOCET/ROXICET) 5-325 MG tablet Take 1 tablet by mouth 2 (two) times daily. 10/28/18  Yes Susy Frizzle, MD  OXYGEN Inhale 3 L into the lungs continuous.    Yes [provider]  pantoprazole (PROTONIX) 40 MG tablet TAKE ONE TABLET BY MOUTH TWICE A DAY 11/17/18  Yes , Modena Nunnery, MD  polyethylene glycol [facility] / GLYCOLAX) packet Take 17 g by mouth daily. 06/04/18  Yes Varney Biles, MD  potassium chloride SA (K-DUR,KLOR-CON) 20 MEQ tablet TAKE 1 TABLET BY MOUTH ONCE A DAY. 11/17/18  Yes , Modena Nunnery, MD  predniSONE (DELTASONE) 20 MG tablet Take 3 daily for 2 days, then 2 daily for 2 days, then 1 daily for 2 days. 01/13/19  Yes [place], Leisa, PA-C  SPIRIVA RESPIMAT 2.5 MCG/ACT AERS INHALE 2 PUFFS INTO THE LUNGS ONCE DAILY.  11/17/18  Yes [place], Modena Nunnery, MD  SYMBICORT 160-4.5 MCG/ACT inhaler INHALE 2 PUFFS INTO THE LUNGS TWICE DAILY. 11/17/18  Yes [place], Modena Nunnery, MD  azithromycin (ZITHROMAX) 250 MG tablet Take 1 tablet (250 mg total) by mouth daily. Take first 2 tablets together, then 1 every day until finished. 01/22/19   Julianne Rice, MD  loratadine (CLARITIN) 10 MG tablet Take 1 tablet (10 mg total) by mouth daily. 01/22/19   Julianne Rice, MD  nitroGLYCERIN (NITROSTAT) 0.4 MG SL tablet Place 1 tablet (0.4 mg total) under the tongue every 5 (five) minutes as needed for chest pain. If no improvement with 3 doses call 911 Patient not taking: Reported on 01/13/2019 08/27/18   Delsa Grana, PA-C  predniSONE (DELTASONE) 10 MG tablet Take 1 tablet (10 mg total) by mouth daily with breakfast. 11/23/18   Alycia Rossetti, MD  traMADol (ULTRAM) 50 MG tablet Take 1 tablet (50 mg total) by mouth every 6 (six) hours as needed. 11/11/18   Milton Ferguson, MD    Family History Family History  Problem Relation Age of Onset  . Heart disease Mother   . Hypertension Sister   . Hypertension Brother     Social History Social History   Tobacco Use  . Smoking status: Current Some Day Smoker    Packs/day: 1.00    Years: 25.00    Pack years: 25.00    Types: Cigarettes  . Smokeless tobacco: Never Used  Substance Use Topics  . Alcohol use: No  . Drug use: No     Allergies   Keflex [cephalexin]; Sulfa antibiotics; Avelox [moxifloxacin hcl in nacl]; and Toradol [ketorolac tromethamine]   Review of Systems Review of Systems  Constitutional: Negative for chills and fever.  HENT: Positive for congestion. Negative for sore throat and trouble swallowing.   Eyes: Negative for visual disturbance.  Respiratory: Positive for cough, shortness of breath and wheezing.   Cardiovascular: Negative for chest pain and leg swelling.  Gastrointestinal: Negative for abdominal pain, constipation, diarrhea, nausea and vomiting.  Genitourinary: Negative for dysuria and flank pain.  Musculoskeletal: Positive for back pain and myalgias. Negative for neck pain and neck stiffness.  Skin: Negative for rash and wound.  Neurological: Negative for dizziness, weakness, light-headedness, numbness and headaches.  All other systems reviewed and are negative.    Physical Exam Updated Vital Signs BP 125/76   Pulse 87   Temp 98.5 F (36.9 C) (Oral)   Resp 15   Ht 5\' 1"  (1.549 m)   Wt 86.8 kg   SpO2 92%   BMI 36.16 kg/m   Physical Exam Vitals signs and nursing note reviewed.  Constitutional:      Appearance: Normal appearance. She is well-developed.  HENT:     Head: Normocephalic and atraumatic.     Nose: Congestion present.     Mouth/Throat:     Mouth: Mucous membranes are moist.     Pharynx: No oropharyngeal exudate.  Eyes:     Extraocular Movements: Extraocular movements intact.     Pupils: Pupils are equal, round, and reactive to light.    Neck:     Musculoskeletal: Normal range of motion and neck supple. No neck rigidity or muscular tenderness.  Cardiovascular:     Rate and Rhythm: Normal rate and regular rhythm.  Pulmonary:     Effort: Pulmonary effort is normal.     Breath sounds: Wheezing present.     Comments: Expiratory wheezing throughout Abdominal:     General:  Bowel sounds are normal.     Palpations: Abdomen is soft.     Tenderness: There is no abdominal tenderness. There is no guarding or rebound.  Musculoskeletal: Normal range of motion.        General: No swelling, tenderness, deformity or signs of injury.     Right lower leg: Edema present.     Comments: 1+ pitting edema to the right lower extremity.  No calf tenderness.  Distal pulses intact.  Lymphadenopathy:     Cervical: No cervical adenopathy.  Skin:    General: Skin is warm and dry.     Findings: No erythema or rash.  Neurological:     General: No focal deficit present.     Mental Status: She is alert and oriented to person, place, and time.  Psychiatric:        Mood and Affect: Mood normal.        Behavior: Behavior normal.      ED Treatments / Results  Labs (all labs ordered are listed, but only abnormal results are displayed) Labs Reviewed  CBC WITH DIFFERENTIAL/PLATELET - Abnormal; Notable for the following components:      Result Value   WBC 16.9 (*)    MCHC 29.9 (*)    Neutro Abs 14.7 (*)    Abs Immature Granulocytes 0.11 (*)    All other components within normal limits  COMPREHENSIVE METABOLIC PANEL - Abnormal; Notable for the following components:   Chloride 88 (*)    CO2 37 (*)    Glucose, Bld 200 (*)    All other components within normal limits  URINALYSIS, ROUTINE W REFLEX MICROSCOPIC - Abnormal; Notable for the following components:   Hgb urine dipstick SMALL (*)    All other components within normal limits  BRAIN NATRIURETIC PEPTIDE  TROPONIN I    EKG EKG Interpretation  Date/Time:  Saturday January 22 2019  15:09:51 EST Ventricular Rate:  111 PR Interval:    QRS Duration: 108 QT Interval:  360 QTC Calculation: 490 R Axis:   141 Text Interpretation:  Sinus tachycardia Right atrial enlargement Right axis deviation Abnormal R-wave progression, early transition Borderline prolonged QT interval Baseline wander in lead(s) II V2 V3 V6 Confirmed by Julianne Rice [phone]) on 01/22/2019 6:10:11 PM   Radiology Dg Chest 2 View  Result Date: 01/22/2019 CLINICAL DATA:  Shortness of breath for the past week. EXAM: CHEST - 2 VIEW COMPARISON:  11/11/2018. FINDINGS: Normal sized heart. Stable small amount of scarring at the left lung base. Otherwise, clear lungs. The lungs remain hyperexpanded with mild diffuse peribronchial thickening and accentuation of the interstitial markings. Coronary artery stent. Lower thoracic vertebral compression deformity with sclerosis, without significant change. IMPRESSION: No acute findings. Stable changes of COPD and chronic bronchitis. Electronically Signed   By: Claudie Revering M.D.   On: 01/22/2019 17:28    Procedures Procedures (including critical care time)  Medications Ordered in ED Medications  morphine 2 MG/ML injection 2 mg (has no administration in time range)  methylPREDNISolone sodium succinate (SOLU-MEDROL) 125 mg/2 mL injection 125 mg (125 mg Intravenous Given 01/22/19 1609)  levalbuterol (XOPENEX) nebulizer solution 1.25 mg (1.25 mg Nebulization Given 01/22/19 1620)  morphine 2 MG/ML injection 2 mg (2 mg Intravenous Given 01/22/19 1610)     Initial Impression / Assessment and Plan / ED Course  I have reviewed the triage vital signs and the nursing notes.  Pertinent labs & imaging results that were available during my care of the patient were  reviewed by me and considered in my medical decision making (see chart for details).    Wheezing has improved though there is some still present.  Suspect this is the patient's baseline.  Patient does appear to have  chronically elevated white blood cell count.  This may be due to the chronic use of steroids.  Patient vital signs are stable.  X-ray without acute findings.  Will start on antibiotic for possible sinusitis and start Claritin as well.  Patient is advised to continue use of Flonase.  Patient understands the need to follow-up with her primary physician and return precautions given.   Final Clinical Impressions(s) / ED Diagnoses   Final diagnoses:  Nasal sinus congestion  COPD exacerbation (HCC)  Chronic midline low back pain without sciatica    ED Discharge Orders         Ordered    loratadine (CLARITIN) 10 MG tablet  Daily     01/22/19 1808    azithromycin (ZITHROMAX) 250 MG tablet  Daily     01/22/19 1808           Julianne Rice, MD 01/22/19 1810

## 2019-01-22 NOTE — ED Notes (Signed)
Pt on home O2, continues to smoke  Takes lasix daily, here doe to increased shortness of breath and back ppain

## 2019-01-22 NOTE — ED Triage Notes (Signed)
Patient complains of shortness of breath x 1 week. States history of COPD and chronic O2 use at home.States sinus congestion and drainage x 1 week.

## 2019-01-24 ENCOUNTER — Ambulatory Visit: Payer: Self-pay | Admitting: Family Medicine

## 2019-01-25 ENCOUNTER — Ambulatory Visit (INDEPENDENT_AMBULATORY_CARE_PROVIDER_SITE_OTHER): Payer: Medicare Other | Admitting: Family Medicine

## 2019-01-25 ENCOUNTER — Other Ambulatory Visit: Payer: Self-pay

## 2019-01-25 ENCOUNTER — Encounter: Payer: Self-pay | Admitting: Family Medicine

## 2019-01-25 VITALS — BP 138/86 | HR 98 | Temp 98.3°F | Resp 18 | Ht 61.0 in | Wt 191.0 lb

## 2019-01-25 DIAGNOSIS — I2510 Atherosclerotic heart disease of native coronary artery without angina pectoris: Secondary | ICD-10-CM | POA: Diagnosis not present

## 2019-01-25 DIAGNOSIS — Z720 Tobacco use: Secondary | ICD-10-CM

## 2019-01-25 DIAGNOSIS — I5032 Chronic diastolic (congestive) heart failure: Secondary | ICD-10-CM

## 2019-01-25 DIAGNOSIS — J3489 Other specified disorders of nose and nasal sinuses: Secondary | ICD-10-CM

## 2019-01-25 DIAGNOSIS — G8929 Other chronic pain: Secondary | ICD-10-CM | POA: Diagnosis not present

## 2019-01-25 DIAGNOSIS — J411 Mucopurulent chronic bronchitis: Secondary | ICD-10-CM | POA: Diagnosis not present

## 2019-01-25 DIAGNOSIS — I272 Pulmonary hypertension, unspecified: Secondary | ICD-10-CM

## 2019-01-25 MED ORDER — ALBUTEROL SULFATE (2.5 MG/3ML) 0.083% IN NEBU: INHALATION_SOLUTION | RESPIRATORY_TRACT | 2 refills | Status: DC

## 2019-01-25 MED ORDER — FLUCONAZOLE 150 MG PO TABS: 150.0000 mg | ORAL_TABLET | Freq: Once | ORAL | 1 refills | Status: AC

## 2019-01-25 MED ORDER — MUPIROCIN CALCIUM 2 % NA OINT: TOPICAL_OINTMENT | NASAL | 0 refills | Status: DC

## 2019-01-25 MED ORDER — OXYCODONE-ACETAMINOPHEN 7.5-325 MG PO TABS: 1.0000 | ORAL_TABLET | ORAL | 0 refills | Status: DC | PRN

## 2019-01-25 MED ORDER — ALBUTEROL SULFATE HFA 108 (90 BASE) MCG/ACT IN AERS: 2.0000 | INHALATION_SPRAY | Freq: Four times a day (QID) | RESPIRATORY_TRACT | 5 refills | Status: DC | PRN

## 2019-01-25 NOTE — Patient Instructions (Addendum)
Okay to decrease the protonix to once a day  Pain medication increased to 7.5mg -325mg   Use nebulizer three times a day  Continue the inhalers Go back on mucinex twice a day Palliative care referral for meeting Ointment for you nose F/U 4 months

## 2019-01-25 NOTE — Progress Notes (Signed)
Subjective:    Patient ID: Cindy [name], female    DOB: Nov 28, 1956, 64 y.o.   MRN: [id number]  Patient presents for ER F/U (COPD Exacerbation) and Back Pain (lower back)  Pt here for ER follow up and follow upchronic medical problems Feels her oxygen is dry though it is humidified.  Of note she was seen in the ER secondary to chronic back pain and nasal congestion she was given azithromycin and a shot of pain medication for her back. Using humidifer at home Nose very dry and states she has sores in her nose   still cant blow anything, uses warm compresses on her face No fever  Taking azithromycin started on 1/25, given claritin   using saline spray, using flonase once a day  Chest is congested, has been doing albuterol nebulizer twice a day  Has spiriva/  Taking prednisone 10mg  once a day  3L is her baseline.  CHF- has chronic edema on right leg , she is alternating 80mg  and 40mg    CAD- Last visit December lipitor increased to 80mg , taking    Chronic back pain, has known compression fracture  seen by spine and scoliois  Percocet 5/325 states that this helps with some of the pain but does not take it all away.  Would like something stronger.  She has had injection by the spine center in [place] however they can only do this every 6 months  She continues to decompensate at home.  Her caregiver who is a family friend is letting her stay with him Louie Casa.  We discussed palliative care referral and they are both willing to do this.   Review Of Systems:  GEN- denies fatigue, fever, weight loss,weakness, recent illness HEENT- denies eye drainage, change in vision, nasal discharge, CVS- denies chest pain, palpitations RESP- denies SOB, +cough,+ wheeze ABD- denies N/V, change in stools, abd pain GU- denies dysuria, hematuria, dribbling, incontinence MSK- +joint pain, muscle aches, injury Neuro- denies headache, dizziness, syncope, seizure activity       Objective:    BP 138/86    Pulse 98   Temp 98.3 F (36.8 C) (Oral)   Resp 18   Ht 5\' 1"  (1.549 m)   Wt 191 lb (86.6 kg)   SpO2 91% Comment: 3L/min via [place]  BMI 36.09 kg/m  GEN- NAD, alert and oriented x3, chronically ill appearing  HEENT- PERRL, EOMI, non injected sclera, pink conjunctiva, MMM, oropharynx clear, right nares scab on septum region Neck- Supple, no thyromegaly CVS- RRR, no murmur RESP-scattered wheeze ABD-NABS,soft,NT,ND Skin- thin skin with bruising on arms/hands EXT- No edema Pulses- Radial2+        Assessment & Plan:      Problem List Items Addressed This Visit      Unprioritized   CAD (coronary artery disease)    No current chest pain.  She is tolerating her blood pressure medications and her cholesterol medication.      Chronic diastolic CHF (congestive heart failure) ([place])    She is currently compensated with regards to her fluid status no change in her Lasix.      Chronic pain    She does have compression fractures related to her steroid use and her chronic back pain.  We will increase her oxycodone to 7.5/325 mg she is unable to get any other type of injections at this time.      Relevant Medications   oxyCODONE-acetaminophen (PERCOCET) 7.5-325 MG tablet   COPD (chronic obstructive pulmonary disease) ([place])  Severe end-stage COPD she is maxed out on medications.  She continues to smoke.  She is on chronic prednisone.  She is completing now another course of antibiotics.  I think palliative care would be a good option for her to help keep her out of emergency room and when she has difficulty at home often she does not want to leave her home to come into the office.  She has not seen her pulmonologist in greater than 6 months because it is taxing for her to get to [place].      Relevant Medications   mupirocin nasal ointment (BACTROBAN NASAL) 2 %   albuterol (PROVENTIL) (2.5 MG/3ML) 0.083% nebulizer solution   albuterol (PROVENTIL HFA;VENTOLIN HFA) 108 (90 Base)  MCG/ACT inhaler   Pulmonary hypertension (HCC)   Tobacco abuse (Chronic)    Other Visit Diagnoses    Nasal sore    -  Primary   Bactroban to be applied.  She has humidified oxygen is also using nasal saline rinse      Note: This dictation was prepared with Dragon dictation along with smaller phrase technology. Any transcriptional errors that result from this process are unintentional.

## 2019-01-26 ENCOUNTER — Encounter: Payer: Self-pay | Admitting: Family Medicine

## 2019-01-26 NOTE — Assessment & Plan Note (Signed)
Severe end-stage COPD she is maxed out on medications.  She continues to smoke.  She is on chronic prednisone.  She is completing now another course of antibiotics.  I think palliative care would be a good option for her to help keep her out of emergency room and when she has difficulty at home often she does not want to leave her home to come into the office.  She has not seen her pulmonologist in greater than 6 months because it is taxing for her to get to [place].

## 2019-01-26 NOTE — Assessment & Plan Note (Signed)
She is currently compensated with regards to her fluid status no change in her Lasix.

## 2019-01-26 NOTE — Assessment & Plan Note (Signed)
She does have compression fractures related to her steroid use and her chronic back pain.  We will increase her oxycodone to 7.5/325 mg she is unable to get any other type of injections at this time.

## 2019-01-26 NOTE — Assessment & Plan Note (Addendum)
No current chest pain.  She is tolerating her blood pressure medications and her cholesterol medication.

## 2019-02-07 ENCOUNTER — Other Ambulatory Visit: Payer: Self-pay | Admitting: Family Medicine

## 2019-02-24 ENCOUNTER — Emergency Department (HOSPITAL_COMMUNITY): Payer: Medicare Other

## 2019-02-24 ENCOUNTER — Inpatient Hospital Stay (HOSPITAL_COMMUNITY)
Admission: EM | Admit: 2019-02-24 | Discharge: 2019-02-28 | DRG: 190 | Disposition: A | Discharge status: Home / Self Care | Payer: Medicare Other | Attending: Family Medicine | Admitting: Family Medicine

## 2019-02-24 ENCOUNTER — Encounter (HOSPITAL_COMMUNITY): Payer: Self-pay | Admitting: Emergency Medicine

## 2019-02-24 ENCOUNTER — Other Ambulatory Visit: Payer: Self-pay

## 2019-02-24 ENCOUNTER — Telehealth: Payer: Self-pay | Admitting: *Deleted

## 2019-02-24 ENCOUNTER — Other Ambulatory Visit (HOSPITAL_COMMUNITY): Payer: Medicare Other

## 2019-02-24 DIAGNOSIS — J9611 Chronic respiratory failure with hypoxia: Secondary | ICD-10-CM | POA: Diagnosis not present

## 2019-02-24 DIAGNOSIS — Z6836 Body mass index (BMI) 36.0-36.9, adult: Secondary | ICD-10-CM

## 2019-02-24 DIAGNOSIS — K5730 Diverticulosis of large intestine without perforation or abscess without bleeding: Secondary | ICD-10-CM | POA: Diagnosis not present

## 2019-02-24 DIAGNOSIS — I272 Pulmonary hypertension, unspecified: Secondary | ICD-10-CM | POA: Diagnosis present

## 2019-02-24 DIAGNOSIS — K219 Gastro-esophageal reflux disease without esophagitis: Secondary | ICD-10-CM | POA: Diagnosis present

## 2019-02-24 DIAGNOSIS — J9621 Acute and chronic respiratory failure with hypoxia: Secondary | ICD-10-CM | POA: Diagnosis present

## 2019-02-24 DIAGNOSIS — G8929 Other chronic pain: Secondary | ICD-10-CM | POA: Diagnosis not present

## 2019-02-24 DIAGNOSIS — M545 Low back pain: Secondary | ICD-10-CM | POA: Diagnosis not present

## 2019-02-24 DIAGNOSIS — J441 Chronic obstructive pulmonary disease with (acute) exacerbation: Principal | ICD-10-CM | POA: Diagnosis present

## 2019-02-24 DIAGNOSIS — I5032 Chronic diastolic (congestive) heart failure: Secondary | ICD-10-CM | POA: Diagnosis not present

## 2019-02-24 DIAGNOSIS — M1990 Unspecified osteoarthritis, unspecified site: Secondary | ICD-10-CM | POA: Diagnosis present

## 2019-02-24 DIAGNOSIS — Z7982 Long term (current) use of aspirin: Secondary | ICD-10-CM

## 2019-02-24 DIAGNOSIS — M810 Age-related osteoporosis without current pathological fracture: Secondary | ICD-10-CM | POA: Diagnosis present

## 2019-02-24 DIAGNOSIS — Z7902 Long term (current) use of antithrombotics/antiplatelets: Secondary | ICD-10-CM

## 2019-02-24 DIAGNOSIS — E1165 Type 2 diabetes mellitus with hyperglycemia: Secondary | ICD-10-CM | POA: Diagnosis present

## 2019-02-24 DIAGNOSIS — I2510 Atherosclerotic heart disease of native coronary artery without angina pectoris: Secondary | ICD-10-CM | POA: Diagnosis present

## 2019-02-24 DIAGNOSIS — M4854XA Collapsed vertebra, not elsewhere classified, thoracic region, initial encounter for fracture: Secondary | ICD-10-CM | POA: Diagnosis present

## 2019-02-24 DIAGNOSIS — F419 Anxiety disorder, unspecified: Secondary | ICD-10-CM | POA: Diagnosis not present

## 2019-02-24 DIAGNOSIS — R0602 Shortness of breath: Secondary | ICD-10-CM | POA: Diagnosis not present

## 2019-02-24 DIAGNOSIS — B370 Candidal stomatitis: Secondary | ICD-10-CM | POA: Diagnosis present

## 2019-02-24 DIAGNOSIS — Z9981 Dependence on supplemental oxygen: Secondary | ICD-10-CM

## 2019-02-24 DIAGNOSIS — N200 Calculus of kidney: Secondary | ICD-10-CM | POA: Diagnosis present

## 2019-02-24 DIAGNOSIS — Z888 Allergy status to other drugs, medicaments and biological substances status: Secondary | ICD-10-CM

## 2019-02-24 DIAGNOSIS — Z955 Presence of coronary angioplasty implant and graft: Secondary | ICD-10-CM

## 2019-02-24 DIAGNOSIS — M4856XA Collapsed vertebra, not elsewhere classified, lumbar region, initial encounter for fracture: Secondary | ICD-10-CM | POA: Diagnosis present

## 2019-02-24 DIAGNOSIS — E669 Obesity, unspecified: Secondary | ICD-10-CM | POA: Diagnosis present

## 2019-02-24 DIAGNOSIS — Z7983 Long term (current) use of bisphosphonates: Secondary | ICD-10-CM

## 2019-02-24 DIAGNOSIS — Z8249 Family history of ischemic heart disease and other diseases of the circulatory system: Secondary | ICD-10-CM

## 2019-02-24 DIAGNOSIS — E785 Hyperlipidemia, unspecified: Secondary | ICD-10-CM | POA: Diagnosis present

## 2019-02-24 DIAGNOSIS — Z7951 Long term (current) use of inhaled steroids: Secondary | ICD-10-CM

## 2019-02-24 DIAGNOSIS — Z882 Allergy status to sulfonamides status: Secondary | ICD-10-CM

## 2019-02-24 DIAGNOSIS — I10 Essential (primary) hypertension: Secondary | ICD-10-CM | POA: Diagnosis present

## 2019-02-24 DIAGNOSIS — I252 Old myocardial infarction: Secondary | ICD-10-CM

## 2019-02-24 DIAGNOSIS — I110 Hypertensive heart disease with heart failure: Secondary | ICD-10-CM | POA: Diagnosis present

## 2019-02-24 DIAGNOSIS — Z7952 Long term (current) use of systemic steroids: Secondary | ICD-10-CM

## 2019-02-24 DIAGNOSIS — F17210 Nicotine dependence, cigarettes, uncomplicated: Secondary | ICD-10-CM | POA: Diagnosis present

## 2019-02-24 DIAGNOSIS — Z881 Allergy status to other antibiotic agents status: Secondary | ICD-10-CM

## 2019-02-24 LAB — COMPREHENSIVE METABOLIC PANEL
ALT: 28 U/L (ref 0–44)
AST: 26 U/L (ref 15–41)
Albumin: 3.7 g/dL (ref 3.5–5.0)
Alkaline Phosphatase: 85 U/L (ref 38–126)
Anion gap: 10 (ref 5–15)
BUN: 11 mg/dL (ref 8–23)
CO2: 38 mmol/L — ABNORMAL HIGH (ref 22–32)
Calcium: 9.3 mg/dL (ref 8.9–10.3)
Chloride: 91 mmol/L — ABNORMAL LOW (ref 98–111)
Creatinine, Ser: 0.88 mg/dL (ref 0.44–1.00)
GFR calc Af Amer: >60 mL/min (ref >60)
GFR calc non Af Amer: >60 mL/min (ref >60)
Glucose, Bld: 164 mg/dL — ABNORMAL HIGH (ref 70–99)
Potassium: 3.2 mmol/L — ABNORMAL LOW (ref 3.5–5.1)
Sodium: 139 mmol/L (ref 135–145)
Total Bilirubin: 0.3 mg/dL (ref 0.3–1.2)
Total Protein: 6.6 g/dL (ref 6.5–8.1)

## 2019-02-24 LAB — CBC WITH DIFFERENTIAL/PLATELET
Abs Immature Granulocytes: 0.1 10*3/uL — ABNORMAL HIGH (ref 0.00–0.07)
Basophils Absolute: 0.1 10*3/uL (ref 0.0–0.1)
Basophils Relative: 0 %
Eosinophils Absolute: 0 10*3/uL (ref 0.0–0.5)
Eosinophils Relative: 0 %
HCT: 41.6 % (ref 36.0–46.0)
Hemoglobin: 12.5 g/dL (ref 12.0–15.0)
Immature Granulocytes: 1 %
Lymphocytes Relative: 21 %
Lymphs Abs: 4 10*3/uL (ref 0.7–4.0)
MCH: 29.5 pg (ref 26.0–34.0)
MCHC: 30 g/dL (ref 30.0–36.0)
MCV: 98.1 fL (ref 80.0–100.0)
MONOS PCT: 9 %
Monocytes Absolute: 1.7 10*3/uL — ABNORMAL HIGH (ref 0.1–1.0)
NEUTROS ABS: 13.1 10*3/uL — AB (ref 1.7–7.7)
Neutrophils Relative %: 69 %
PLATELETS: 303 10*3/uL (ref 150–400)
RBC: 4.24 MIL/uL (ref 3.87–5.11)
RDW: 14.6 % (ref 11.5–15.5)
WBC: 19 10*3/uL — ABNORMAL HIGH (ref 4.0–10.5)
nRBC: 0 % (ref 0.0–0.2)

## 2019-02-24 LAB — LIPASE, BLOOD: LIPASE: 45 U/L (ref 11–51)

## 2019-02-24 MED ORDER — ALBUTEROL (5 MG/ML) CONTINUOUS INHALATION SOLN
10.0000 mg/h | INHALATION_SOLUTION | Freq: Once | RESPIRATORY_TRACT | Status: AC
  Administered 2019-02-25: 10 mg/h via RESPIRATORY_TRACT
  Filled 2019-02-24: qty 20

## 2019-02-24 MED ORDER — IPRATROPIUM BROMIDE 0.02 % IN SOLN
0.5000 mg | Freq: Once | RESPIRATORY_TRACT | Status: AC
  Administered 2019-02-25: 0.5 mg via RESPIRATORY_TRACT
  Filled 2019-02-24: qty 2.5

## 2019-02-24 MED ORDER — IOHEXOL 300 MG/ML  SOLN
100.0000 mL | Freq: Once | INTRAMUSCULAR | Status: AC | PRN
  Administered 2019-02-25: 100 mL via INTRAVENOUS

## 2019-02-24 MED ORDER — METHYLPREDNISOLONE SODIUM SUCC 125 MG IJ SOLR
80.0000 mg | Freq: Once | INTRAMUSCULAR | Status: AC
  Administered 2019-02-24: 80 mg via INTRAVENOUS
  Filled 2019-02-24: qty 2

## 2019-02-24 MED ORDER — MAGNESIUM SULFATE 2 GM/50ML IV SOLN
2.0000 g | Freq: Once | INTRAVENOUS | Status: AC
  Administered 2019-02-25: 2 g via INTRAVENOUS
  Filled 2019-02-24: qty 50

## 2019-02-24 NOTE — ED Notes (Signed)
Pt demanding lotion

## 2019-02-24 NOTE — ED Notes (Signed)
Pharm tech in room

## 2019-02-24 NOTE — ED Provider Notes (Addendum)
[facility] EMERGENCY DEPARTMENT Provider Note   CSN: [id number] Arrival date & time: 02/24/19  2108    History   Chief Complaint Chief Complaint  Patient presents with  . Back Pain    HPI Cindy [name] is a 63 y.o. female.     Patient with history of CAD status post stents, COPD on chronic oxygen and chronic steroids, chronic diastolic heart failure, compression fractures in her back, previous MI presenting with a several day history of worsening shortness of breath.  States she is used her nebulizer machine at home twice today but continues to feel short of breath worse than baseline.  Has had a nonproductive cough and rhinorrhea.  Denies fevers.  Denies chest pain.  Complains of pain in her back which she states is been an ongoing issue in the past several years.  No recent falls or trauma.  The pain is in her low back and radiates to both sides to her buttocks.  There is been no focal weakness, numbness or tingling.  There is no been no bowel or bladder incontinence.  No fever or vomiting.  She does take chronic pain medication at home.  She reports she sometimes has spasms in her back that radiates to her abdomen and cause diffuse abdominal pain but she is not having any of this now.  The history is provided by the patient.  Back Pain  Associated symptoms: abdominal pain and weakness   Associated symptoms: no chest pain, no dysuria, no fever, no headaches and no numbness     Past Medical History:  Diagnosis Date  . 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 Arthritis   . CAD (coronary artery disease)    a. NSTEMI 07/2018 s/p difficult PCI with overlapping DES to LAD, residual OM2 disease treated medically.  . Chronic diastolic CHF (congestive heart failure) ([place])   . Chronic pain   . Chronic respiratory failure ([employer]) On home 02 2-3L  . Compression fracture of lumbar vertebra ([place])   . COPD (chronic obstructive pulmonary disease) ([place])   . Essential hypertension   . Hyperglycemia,  drug-induced   . Hyperlipidemia   . Kidney stones 2014   Left side, multiple  . Leukocytosis   . Myocardial infarct ([place])   . Osteoporosis 2013  . Thyroid disease    pt denies  . Tobacco abuse     Patient Active Problem List   Diagnosis Date Noted  . Chronic pain 11/18/2018  . CAD (coronary artery disease) 10/05/2018  . Chronic diastolic CHF (congestive heart failure) ([place])   . S/P PTCA (percutaneous transluminal coronary angioplasty)   . Orthopnea   . Non-ST elevation (NSTEMI) myocardial infarction ([place])   . Essential hypertension   . Acute on chronic respiratory failure ([place]) 08/12/2018  . Bilateral leg edema 08/11/2018  . Hypokalemia 08/11/2018  . Abnormal EKG 08/11/2018  . Diastolic dysfunction 07/37/1062  . Hypercarbia 08/10/2018  . Pulmonary hypertension ([place]) 05/01/2016  . Leg swelling 04/15/2016  . Rash and nonspecific skin eruption 08/15/2014  . Ureteral stone with hydronephrosis 05/30/2013  . Allergic rhinitis 04/11/2013  . Ecchymosis 04/11/2013  . Kidney stones 04/11/2013  . Lumbar back pain 03/03/2013  . Compression fracture of L3 lumbar vertebra 03/03/2013  . Constipation 03/03/2013  . Sinus tachycardia 06/27/2012  . Osteopenia 03/16/2012  . GERD (gastroesophageal reflux disease) 03/16/2012  . Thrush 03/02/2012  . Chronic hypoxemic respiratory failure ([place]) 02/27/2012  . Hyperlipidemia 02/25/2012  . Hyperglycemia, drug-induced 09/25/2011  . Tobacco  abuse 09/24/2011  . COPD (chronic obstructive pulmonary disease) ([place]) 12/24/2009    Past Surgical History:  Procedure Laterality Date  . CERVICAL BIOPSY    . COLONOSCOPY N/A 03/31/2013   Procedure: COLONOSCOPY;  Surgeon: Rogene [place], MD;  Location: [facility];  Service: Endoscopy;  Laterality: N/A;  730  . CORONARY ATHERECTOMY N/A 08/19/2018   Procedure: CORONARY ATHERECTOMY;  Surgeon: Nelva Bush, MD;  Location: [place] CV LAB;  Service: Cardiovascular;  Laterality: N/A;  . CORONARY STENT  INTERVENTION N/A 08/19/2018   Procedure: CORONARY STENT INTERVENTION;  Surgeon: Nelva Bush, MD;  Location: [place] CV LAB;  Service: Cardiovascular;  Laterality: N/A;  . INTRAVASCULAR ULTRASOUND/IVUS N/A 08/19/2018   Procedure: Intravascular Ultrasound/IVUS;  Surgeon: Nelva Bush, MD;  Location: [place] CV LAB;  Service: Cardiovascular;  Laterality: N/A;  . LEFT HEART CATH N/A 08/19/2018   Procedure: Left Heart Cath;  Surgeon: Nelva Bush, MD;  Location: [place] CV LAB;  Service: Cardiovascular;  Laterality: N/A;  . LEFT HEART CATH AND CORONARY ANGIOGRAPHY N/A 08/16/2018   Procedure: LEFT HEART CATH AND CORONARY ANGIOGRAPHY;  Surgeon: Nelva Bush, MD;  Location: [place] CV LAB;  Service: Cardiovascular;  Laterality: N/A;  . TUBAL LIGATION       OB History   No obstetric history on file.      Home Medications    Prior to Admission medications   Medication Sig Start Date End Date Taking? Authorizing Provider  albuterol (PROVENTIL HFA;VENTOLIN HFA) 108 (90 Base) MCG/ACT inhaler Inhale 2 puffs into the lungs every 6 (six) hours as needed for wheezing or shortness of breath. 01/25/19  Yes [place], Modena Nunnery, MD  albuterol (PROVENTIL) (2.5 MG/3ML) 0.083% nebulizer solution 1 nebulizer every 4 hours as needed Patient taking differently: Take 2.5 mg by nebulization every 4 (four) hours as needed for wheezing or shortness of breath.  01/25/19  Yes [place], Modena Nunnery, MD  alendronate (FOSAMAX) 70 MG tablet TAKE 1 TAB BY MOUTH EVERY WEEK ON AN EMPTY STOMACH. Patient taking differently: Take 70 mg by mouth every Wednesday.  10/20/18  Yes [place], Modena Nunnery, MD  aspirin EC 81 MG tablet Take 81 mg by mouth every morning.    Yes [provider]  atorvastatin (LIPITOR) 80 MG tablet Take 1 tablet (80 mg total) by mouth daily. Patient taking differently: Take 80 mg by mouth every evening.  12/13/18 12/08/19 Yes Herminio Commons, MD  Calcium Carb-Cholecalciferol  600-800 MG-UNIT TABS Take 1 tablet by mouth 2 (two) times daily.    Yes [provider]  clopidogrel (PLAVIX) 75 MG tablet Take 1 tablet (75 mg total) by mouth daily. 09/08/18  Yes [place], Dayna N, PA-C  diphenhydrAMINE (BENADRYL) 12.5 MG/5ML liquid Take 6.25 mg by mouth 4 (four) times daily as needed.   Yes [provider]  furosemide (LASIX) 40 MG tablet Alternate taking 80mg  one day, then take 40mg  the next day, then repeat. Patient taking differently: Take 40-80 mg by mouth See admin instructions. Alternate taking 80mg  one day, then take 40mg  the next day, then repeat. 11/19/18  Yes [place], Modena Nunnery, MD  Magnesium 250 MG TABS Take 1 tablet daily Patient taking differently: Take 250 mg by mouth daily.  11/17/18  Yes [place], Modena Nunnery, MD  metoprolol tartrate (LOPRESSOR) 25 MG tablet Take 0.5 tablets (12.5 mg total) by mouth 2 (two) times daily. 10/11/18  Yes Imogene Burn, PA-C  nitroGLYCERIN (NITROSTAT) 0.4 MG SL tablet Place 1 tablet (0.4 mg  total) under the tongue every 5 (five) minutes as needed for chest pain. If no improvement with 3 doses call 911 08/27/18  Yes Delsa Grana, PA-C  nystatin (MYCOSTATIN) 100000 UNIT/ML suspension TAKE 5 ML BY MOUTH FOUR TIMES A DAY AS DIRECTED. Patient taking differently: Take 5 mLs by mouth 4 (four) times daily.  10/04/18  Yes [place], Modena Nunnery, MD  oxyCODONE-acetaminophen (PERCOCET) 7.5-325 MG tablet Take 1 tablet by mouth every 4 (four) hours as needed for severe pain. Patient taking differently: Take 0.25-1 tablets by mouth every 4 (four) hours as needed for severe pain.  01/25/19  Yes [place], Modena Nunnery, MD  OXYGEN Inhale 3 L into the lungs continuous.    Yes [provider]  pantoprazole (PROTONIX) 40 MG tablet TAKE ONE TABLET BY MOUTH TWICE A DAY 11/17/18  Yes [place], Modena Nunnery, MD  polyethylene glycol [facility] / GLYCOLAX) packet Take 17 g by mouth daily. Patient taking differently: Take 17 g by mouth daily as needed for mild  constipation or moderate constipation.  06/04/18  Yes Varney Biles, MD  potassium chloride SA (K-DUR,KLOR-CON) 20 MEQ tablet TAKE 1 TABLET BY MOUTH ONCE A DAY. Patient taking differently: Take 20 mEq by mouth daily.  11/17/18  Yes [place], Modena Nunnery, MD  predniSONE (DELTASONE) 10 MG tablet Take 1 tablet (10 mg total) by mouth daily with breakfast. 11/23/18  Yes [place], Modena Nunnery, MD  SPIRIVA RESPIMAT 2.5 MCG/ACT AERS INHALE 2 PUFFS INTO THE LUNGS ONCE DAILY. Patient taking differently: Inhale 2 puffs into the lungs at bedtime.  11/17/18  Yes , Modena Nunnery, MD  SYMBICORT 160-4.5 MCG/ACT inhaler INHALE 2 PUFFS INTO THE LUNGS TWICE DAILY. Patient taking differently: Inhale 2 puffs into the lungs 2 (two) times daily.  11/17/18  Yes , Modena Nunnery, MD  fluticasone (FLONASE) 50 MCG/ACT nasal spray INSTILL 1 SPRAY INTO EACH NOSTRIL DAILY AS NEEDED FOR ALLERGIES. Patient not taking: Reported on 02/24/2019 01/13/19   Alycia Rossetti, MD  loratadine (CLARITIN) 10 MG tablet Take 1 tablet (10 mg total) by mouth daily. Patient not taking: Reported on 02/24/2019 01/22/19   Julianne Rice, MD  mupirocin nasal ointment [facility] NASAL) 2 % Apply to nares twice a day for 5 days Patient not taking: Reported on 02/24/2019 01/25/19   Alycia Rossetti, MD    Family History Family History  Problem Relation Age of Onset  . Heart disease Mother   . Hypertension Sister   . Hypertension Brother     Social History Social History   Tobacco Use  . Smoking status: Current Some Day Smoker    Packs/day: 1.00    Years: 25.00    Pack years: 25.00    Types: Cigarettes  . Smokeless tobacco: Never Used  Substance Use Topics  . Alcohol use: No  . Drug use: No     Allergies   Keflex [cephalexin]; Sulfa antibiotics; Avelox [moxifloxacin hcl in nacl]; and Toradol [ketorolac tromethamine]   Review of Systems Review of Systems  Constitutional: Negative for activity change, appetite change and fever.    HENT: Positive for congestion and rhinorrhea.   Respiratory: Positive for cough and shortness of breath. Negative for chest tightness.   Cardiovascular: Negative for chest pain.  Gastrointestinal: Positive for abdominal pain and nausea. Negative for vomiting.  Genitourinary: Negative for dysuria, hematuria, vaginal bleeding and vaginal discharge.  Musculoskeletal: Positive for back pain. Negative for neck pain.  Skin: Negative for rash.  Neurological: Positive for weakness. Negative for dizziness, seizures, numbness  and headaches.   all other systems are negative except as noted in the HPI and PMH.     Physical Exam Updated Vital Signs BP (!) 148/99   Pulse 99   Temp 98.3 F (36.8 C) (Oral)   Resp 20   Ht 5\' 1"  (1.549 m)   Wt 86.6 kg   SpO2 97%   BMI 36.09 kg/m   Physical Exam Vitals signs and nursing note reviewed.  Constitutional:      General: She is in acute distress.     Appearance: She is well-developed. She is obese.     Comments: Moderate increased work of breathing, dyspneic with conversation  HENT:     Head: Normocephalic and atraumatic.     Nose: Congestion and rhinorrhea present.     Mouth/Throat:     Pharynx: No oropharyngeal exudate.  Eyes:     Conjunctiva/sclera: Conjunctivae normal.     Pupils: Pupils are equal, round, and reactive to light.  Neck:     Musculoskeletal: Normal range of motion and neck supple.     Comments: No meningismus. Cardiovascular:     Rate and Rhythm: Normal rate and regular rhythm.     Heart sounds: Normal heart sounds. No murmur.  Pulmonary:     Effort: Respiratory distress present.     Breath sounds: Wheezing present.     Comments: Decreased air exchange with inspiratory and expiratory wheezing bilaterally Abdominal:     General: There is distension.     Palpations: Abdomen is soft.     Tenderness: There is abdominal tenderness. There is no guarding or rebound.     Comments: Obese abdomen, mild diffuse tenderness, no  guarding or rebound  Musculoskeletal: Normal range of motion.        General: Tenderness present.     Right lower leg: Edema present.     Left lower leg: Edema present.     Comments: Paraspinal lumbar tenderness, no midline tenderness  5/5 strength in bilateral lower extremities. Ankle plantar and dorsiflexion intact. Great toe extension intact bilaterally. +2 DP and PT pulses. +2 patellar reflexes bilaterally.   Trace pedal edema  Skin:    General: Skin is warm.     Capillary Refill: Capillary refill takes less than 2 seconds.  Neurological:     General: No focal deficit present.     Mental Status: She is alert and oriented to person, place, and time. Mental status is at baseline.     Cranial Nerves: No cranial nerve deficit.     Motor: No abnormal muscle tone.     Coordination: Coordination normal.     Comments: No ataxia on finger to nose bilaterally. No pronator drift. 5/5 strength throughout. CN 2-12 intact.Equal grip strength. Sensation intact.   Psychiatric:        Behavior: Behavior normal.      ED Treatments / Results  Labs (all labs ordered are listed, but only abnormal results are displayed) Labs Reviewed  COMPREHENSIVE METABOLIC PANEL - Abnormal; Notable for the following components:      Result Value   Potassium 3.2 (*)    Chloride 91 (*)    CO2 38 (*)    Glucose, Bld 164 (*)    All other components within normal limits  CBC WITH DIFFERENTIAL/PLATELET - Abnormal; Notable for the following components:   WBC 19.0 (*)    Neutro Abs 13.1 (*)    Monocytes Absolute 1.7 (*)    Abs Immature Granulocytes 0.10 (*)  All other components within normal limits  URINALYSIS, ROUTINE W REFLEX MICROSCOPIC - Abnormal; Notable for the following components:   APPearance HAZY (*)    Hgb urine dipstick LARGE (*)    Leukocytes,Ua TRACE (*)    RBC / HPF >50 (*)    Bacteria, UA RARE (*)    All other components within normal limits  BASIC METABOLIC PANEL - Abnormal; Notable for  the following components:   Chloride 89 (*)    CO2 34 (*)    Glucose, Bld 282 (*)    All other components within normal limits  CBC - Abnormal; Notable for the following components:   WBC 19.7 (*)    MCHC 29.4 (*)    All other components within normal limits  LIPASE, BLOOD  TROPONIN I  BRAIN NATRIURETIC PEPTIDE    EKG EKG Interpretation  Date/Time:  Thursday February 24 2019 23:47:56 EST Ventricular Rate:  101 PR Interval:    QRS Duration: 102 QT Interval:  382 QTC Calculation: 496 R Axis:   102 Text Interpretation:  Sinus tachycardia Right axis deviation Borderline prolonged QT interval No significant change was found Confirmed by Ezequiel Essex [phone]) on 02/25/2019 12:36:56 AM   Radiology Dg Chest 2 View  Result Date: 02/25/2019 CLINICAL DATA:  Increasing shortness of breath. Former smoker. EXAM: CHEST - 2 VIEW COMPARISON:  01/22/2019 FINDINGS: Emphysematous changes in the lungs. Central peribronchial thickening and interstitial changes likely representing chronic bronchitis. No consolidation or airspace disease. No blunting of costophrenic angles. No pneumothorax. Normal heart size and pulmonary vascularity. Coronary artery stent. Calcification of the aorta. Compression of a lower thoracic vertebra, unchanged. IMPRESSION: Emphysematous and chronic bronchitic changes in the lungs. No evidence of active pulmonary disease. Electronically Signed   By: Lucienne Capers M.D.   On: 02/25/2019 02:53   Ct Abdomen Pelvis W Contrast  Result Date: 02/25/2019 CLINICAL DATA:  63 year old female with abdominal pain. EXAM: CT ABDOMEN AND PELVIS WITH CONTRAST TECHNIQUE: Multidetector CT imaging of the abdomen and pelvis was performed using the standard protocol following bolus administration of intravenous contrast. CONTRAST:  122mL OMNIPAQUE IOHEXOL 300 MG/ML  SOLN COMPARISON:  CT of the abdomen pelvis dated 05/25/2018 FINDINGS: Lower chest: The visualized lung bases are clear. There is  coronary vascular calcification. No intra-abdominal free air or free fluid. Hepatobiliary: The liver is unremarkable. No intrahepatic biliary ductal dilatation. There is a lobulated and septated appearance of the gallbladder fundus which may represent adenomyomatosis. This finding is somewhat similar to the prior CT. A gallbladder mass is not excluded. Further evaluation with ultrasound on a nonemergent basis recommended. No calcified stone. A 7 mm nodular density in the gallbladder fundus may represent an adherent noncalcified stone or a polyp. This is similar to prior CT. Pancreas: Unremarkable. No pancreatic ductal dilatation or surrounding inflammatory changes. Spleen: Normal in size without focal abnormality. Adrenals/Urinary Tract: Left adrenal thickening/hyperplasia or adenoma. This is similar to prior CT. The right adrenal gland is unremarkable. There are multiple nonobstructing bilateral renal calculi measure up to 8 mm in the upper pole of the right kidney. There is no hydronephrosis on either side. The visualized ureters and urinary bladder appear unremarkable. There is symmetric enhancement and excretion of contrast by both kidneys. Stomach/Bowel: There is moderate stool throughout the colon. There is no bowel obstruction or active inflammation. There is sigmoid diverticulosis without active inflammatory changes. There is a 4 cm duodenal diverticulum. Normal appendix. Vascular/Lymphatic: Moderate aortoiliac atherosclerotic disease. The aorta is tortuous. No portal venous  gas. There is no adenopathy. Reproductive: The uterus and ovaries are grossly unremarkable. No pelvic mass. Other: None Musculoskeletal: Osteopenia with degenerative changes of the spine. There is compression fracture of the L3 inferior endplate with approximately 50% loss of vertebral body height, similar to prior study. Age indeterminate compression fracture of L1 inferior endplate, new since the prior CT. There is compression fracture  of T11 with near complete loss of vertebral body height centrally and anterior wedging, age indeterminate. IMPRESSION: 1. No bowel obstruction or active inflammation. Normal appendix. 2. Sigmoid diverticulosis. 3. Bilateral nonobstructing renal calculi. No hydronephrosis. 4. 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 Further evaluation with ultrasound on a nonemergent basis recommended to exclude a gallbladder mass. 5. Age indeterminate compression fractures of L1 and T11. Clinical correlation is recommended. Electronically Signed   By: Anner Crete M.D.   On: 02/25/2019 02:55    Procedures Procedures (including critical care time)  Medications Ordered in ED Medications - No data to display   Initial Impression / Assessment and Plan / ED Course  I have reviewed the triage vital signs and the nursing notes.  Pertinent labs & imaging results that were available during my care of the patient were reviewed by me and considered in my medical decision making (see chart for details).       Patient with severe COPD presenting with difficulty breathing and cough over the past several days.  Also has worsening of her chronic back pain.  No new trauma.  Neurologically intact.  No fever or incontinence.  Low suspicion for cord compression or cauda equina.  Patient given nebulizer, steroids and magnesium for her shortness of breath and wheezing.  EKG is sinus rhythm. Chest x-ray shows no infiltrate.  Urinalysis shows hematuria without evidence of infection.  Will send culture. CT abdomen obtained shows no acute findings.  Compression fractures of T11 and L1. Suspect this is the source of her back pain which is mostly chronic. No worrisome neurological findings today. No AAA on CT.  Labs with chronic leukocytosis.  Remains wheezing and dyspneic despite multiple nebs in the ED. Dyspnea worsens with speech and exertion. Admission d/w Dr. Shanon Brow  CRITICAL CARE Performed  by: Ezequiel Essex Total critical care time: 35 minutes Critical care time was exclusive of separately billable procedures and treating other patients. Critical care was necessary to treat or prevent imminent or life-threatening deterioration. Critical care was time spent personally by me on the following activities: development of treatment plan with patient and/or surrogate as well as nursing, discussions with consultants, evaluation of patient's response to treatment, examination of patient, obtaining history from patient or surrogate, ordering and performing treatments and interventions, ordering and review of laboratory studies, ordering and review of radiographic studies, pulse oximetry and re-evaluation of patient's condition.   Final Clinical Impressions(s) / ED Diagnoses   Final diagnoses:  COPD exacerbation ([place])  Chronic bilateral low back pain without sciatica    ED Discharge Orders    None       Xander Jutras, Annie Main, MD 02/25/19 1654    Ezequiel Essex, MD 03/07/19 [phone]

## 2019-02-24 NOTE — Telephone Encounter (Signed)
Received call from patient. Patient has multiple chronic complaints.   1. Chronic back pain from compression fracture- States that her pain level is steadily increasing every day. Reports that she has horrible catch in her back when she tries to stand up and it goes around to her "guts" and then to her "privates". Advised to follow up with [facility] for her back pain. She states that there is no need to call them because they can't help her. Reports that the only thing they are willing to [facility] is the injections. Advised that she is a poor candidate for surgical interventions, so the injections may be the best treatment option for her pain. Again advised to contact specialists for further recommendations. Patient then became upset stating "why do I have to call them? You set me up with them." Advised that she does not require referral or further documentation from [facility], so she can call and schedule an appointment at her convenience.   2. Nasal Congestion- Patient states that she has severely congestion nares and cannot breathe through her nose. States that her SpO2 remains at 92% on 3L/mn via [place]. Advised to use O2 mask while nares are congested. Denies cough or chest congestion. Denies any color to mucus from nares. Advised to use Flonase and Nasal Saline as directed. Advised to use OTC antihistamine. Advised if Sx worsen or persist, OV will be required.   3. ABTx- Patient states that she just needs ABTx and more steroids to get better. Advised that OV will be required for more ABTx since there are no Sx at this time requiring ABTx to be given over the phone.   MD to be made aware.

## 2019-02-24 NOTE — ED Triage Notes (Signed)
Pt c/o back pain and lower abd pain x one week. Pt is on continuous oxygen.

## 2019-02-25 ENCOUNTER — Emergency Department (HOSPITAL_COMMUNITY): Payer: Medicare Other

## 2019-02-25 ENCOUNTER — Encounter (HOSPITAL_COMMUNITY): Payer: Self-pay

## 2019-02-25 DIAGNOSIS — I2510 Atherosclerotic heart disease of native coronary artery without angina pectoris: Secondary | ICD-10-CM | POA: Diagnosis not present

## 2019-02-25 DIAGNOSIS — Z9981 Dependence on supplemental oxygen: Secondary | ICD-10-CM | POA: Diagnosis not present

## 2019-02-25 DIAGNOSIS — M1990 Unspecified osteoarthritis, unspecified site: Secondary | ICD-10-CM | POA: Diagnosis present

## 2019-02-25 DIAGNOSIS — G8929 Other chronic pain: Secondary | ICD-10-CM | POA: Diagnosis not present

## 2019-02-25 DIAGNOSIS — I252 Old myocardial infarction: Secondary | ICD-10-CM | POA: Diagnosis not present

## 2019-02-25 DIAGNOSIS — M4856XA Collapsed vertebra, not elsewhere classified, lumbar region, initial encounter for fracture: Secondary | ICD-10-CM | POA: Diagnosis present

## 2019-02-25 DIAGNOSIS — Z7983 Long term (current) use of bisphosphonates: Secondary | ICD-10-CM | POA: Diagnosis not present

## 2019-02-25 DIAGNOSIS — Z7952 Long term (current) use of systemic steroids: Secondary | ICD-10-CM | POA: Diagnosis not present

## 2019-02-25 DIAGNOSIS — Z7982 Long term (current) use of aspirin: Secondary | ICD-10-CM | POA: Diagnosis not present

## 2019-02-25 DIAGNOSIS — J441 Chronic obstructive pulmonary disease with (acute) exacerbation: Principal | ICD-10-CM

## 2019-02-25 DIAGNOSIS — M4854XA Collapsed vertebra, not elsewhere classified, thoracic region, initial encounter for fracture: Secondary | ICD-10-CM | POA: Diagnosis present

## 2019-02-25 DIAGNOSIS — J9611 Chronic respiratory failure with hypoxia: Secondary | ICD-10-CM | POA: Diagnosis not present

## 2019-02-25 DIAGNOSIS — Z7951 Long term (current) use of inhaled steroids: Secondary | ICD-10-CM | POA: Diagnosis not present

## 2019-02-25 DIAGNOSIS — I110 Hypertensive heart disease with heart failure: Secondary | ICD-10-CM | POA: Diagnosis present

## 2019-02-25 DIAGNOSIS — I10 Essential (primary) hypertension: Secondary | ICD-10-CM | POA: Diagnosis not present

## 2019-02-25 DIAGNOSIS — J9621 Acute and chronic respiratory failure with hypoxia: Secondary | ICD-10-CM | POA: Diagnosis present

## 2019-02-25 DIAGNOSIS — M810 Age-related osteoporosis without current pathological fracture: Secondary | ICD-10-CM | POA: Diagnosis present

## 2019-02-25 DIAGNOSIS — N200 Calculus of kidney: Secondary | ICD-10-CM | POA: Diagnosis present

## 2019-02-25 DIAGNOSIS — Z7902 Long term (current) use of antithrombotics/antiplatelets: Secondary | ICD-10-CM | POA: Diagnosis not present

## 2019-02-25 DIAGNOSIS — K219 Gastro-esophageal reflux disease without esophagitis: Secondary | ICD-10-CM | POA: Diagnosis present

## 2019-02-25 DIAGNOSIS — K5730 Diverticulosis of large intestine without perforation or abscess without bleeding: Secondary | ICD-10-CM | POA: Diagnosis not present

## 2019-02-25 DIAGNOSIS — B370 Candidal stomatitis: Secondary | ICD-10-CM | POA: Diagnosis present

## 2019-02-25 DIAGNOSIS — I272 Pulmonary hypertension, unspecified: Secondary | ICD-10-CM | POA: Diagnosis present

## 2019-02-25 DIAGNOSIS — E785 Hyperlipidemia, unspecified: Secondary | ICD-10-CM | POA: Diagnosis present

## 2019-02-25 DIAGNOSIS — Z955 Presence of coronary angioplasty implant and graft: Secondary | ICD-10-CM | POA: Diagnosis not present

## 2019-02-25 DIAGNOSIS — R0602 Shortness of breath: Secondary | ICD-10-CM | POA: Diagnosis not present

## 2019-02-25 DIAGNOSIS — I5032 Chronic diastolic (congestive) heart failure: Secondary | ICD-10-CM | POA: Diagnosis present

## 2019-02-25 LAB — CBC
HCT: 41.2 % (ref 36.0–46.0)
Hemoglobin: 12.1 g/dL (ref 12.0–15.0)
MCH: 28.7 pg (ref 26.0–34.0)
MCHC: 29.4 g/dL — ABNORMAL LOW (ref 30.0–36.0)
MCV: 97.9 fL (ref 80.0–100.0)
PLATELETS: 297 10*3/uL (ref 150–400)
RBC: 4.21 MIL/uL (ref 3.87–5.11)
RDW: 14.8 % (ref 11.5–15.5)
WBC: 19.7 10*3/uL — ABNORMAL HIGH (ref 4.0–10.5)
nRBC: 0 % (ref 0.0–0.2)

## 2019-02-25 LAB — GLUCOSE, CAPILLARY: GLUCOSE-CAPILLARY: 160 mg/dL — AB (ref 70–99)

## 2019-02-25 LAB — URINALYSIS, ROUTINE W REFLEX MICROSCOPIC
Bilirubin Urine: NEGATIVE
Glucose, UA: NEGATIVE mg/dL
Ketones, ur: NEGATIVE mg/dL
Nitrite: NEGATIVE
Protein, ur: NEGATIVE mg/dL
SPECIFIC GRAVITY, URINE: 1.013 (ref 1.005–1.030)
pH: 6 (ref 5.0–8.0)

## 2019-02-25 LAB — BASIC METABOLIC PANEL
Anion gap: 13 (ref 5–15)
BUN: 14 mg/dL (ref 8–23)
CO2: 34 mmol/L — ABNORMAL HIGH (ref 22–32)
Calcium: 9.5 mg/dL (ref 8.9–10.3)
Chloride: 89 mmol/L — ABNORMAL LOW (ref 98–111)
Creatinine, Ser: 0.91 mg/dL (ref 0.44–1.00)
GFR calc Af Amer: >60 mL/min (ref >60)
Glucose, Bld: 282 mg/dL — ABNORMAL HIGH (ref 70–99)
Potassium: 4.2 mmol/L (ref 3.5–5.1)
Sodium: 136 mmol/L (ref 135–145)

## 2019-02-25 LAB — TROPONIN I: Troponin I: <0.03 ng/mL (ref <0.03)

## 2019-02-25 LAB — BRAIN NATRIURETIC PEPTIDE: B Natriuretic Peptide: 21 pg/mL (ref 0.0–100.0)

## 2019-02-25 MED ORDER — CLOPIDOGREL BISULFATE 75 MG PO TABS
75.0000 mg | ORAL_TABLET | Freq: Every day | ORAL | Status: DC
  Administered 2019-02-25 – 2019-02-28 (×4): 75 mg via ORAL
  Filled 2019-02-25 (×4): qty 1

## 2019-02-25 MED ORDER — SODIUM CHLORIDE 0.9% FLUSH
3.0000 mL | Freq: Two times a day (BID) | INTRAVENOUS | Status: DC
  Administered 2019-02-25 – 2019-02-28 (×4): 3 mL via INTRAVENOUS

## 2019-02-25 MED ORDER — GUAIFENESIN ER 600 MG PO TB12
1200.0000 mg | ORAL_TABLET | Freq: Two times a day (BID) | ORAL | Status: DC
  Administered 2019-02-25 – 2019-02-28 (×7): 1200 mg via ORAL
  Filled 2019-02-25 (×7): qty 2

## 2019-02-25 MED ORDER — FUROSEMIDE 40 MG PO TABS: 40.0000 mg | ORAL_TABLET | ORAL | Status: DC

## 2019-02-25 MED ORDER — SODIUM CHLORIDE 0.9 % IV SOLN: 250.0000 mL | INTRAVENOUS | Status: DC | PRN

## 2019-02-25 MED ORDER — SODIUM CHLORIDE 3 % IN NEBU
4.0000 mL | INHALATION_SOLUTION | Freq: Every day | RESPIRATORY_TRACT | Status: DC
  Filled 2019-02-25 (×3): qty 4

## 2019-02-25 MED ORDER — METHYLPREDNISOLONE SODIUM SUCC 125 MG IJ SOLR: 80.0000 mg | Freq: Two times a day (BID) | INTRAMUSCULAR | Status: DC

## 2019-02-25 MED ORDER — ALBUTEROL SULFATE (2.5 MG/3ML) 0.083% IN NEBU: 3.0000 mL | INHALATION_SOLUTION | Freq: Four times a day (QID) | RESPIRATORY_TRACT | Status: DC | PRN

## 2019-02-25 MED ORDER — IPRATROPIUM BROMIDE 0.02 % IN SOLN: 0.5000 mg | Freq: Four times a day (QID) | RESPIRATORY_TRACT | Status: DC

## 2019-02-25 MED ORDER — PANTOPRAZOLE SODIUM 40 MG PO TBEC
40.0000 mg | DELAYED_RELEASE_TABLET | Freq: Two times a day (BID) | ORAL | Status: DC
  Administered 2019-02-25 – 2019-02-28 (×7): 40 mg via ORAL
  Filled 2019-02-25 (×7): qty 1

## 2019-02-25 MED ORDER — HYDROMORPHONE HCL 1 MG/ML IJ SOLN
1.0000 mg | Freq: Once | INTRAMUSCULAR | Status: AC
  Administered 2019-02-25: 1 mg via INTRAVENOUS
  Filled 2019-02-25: qty 1

## 2019-02-25 MED ORDER — ALBUTEROL SULFATE (2.5 MG/3ML) 0.083% IN NEBU: 2.5000 mg | INHALATION_SOLUTION | Freq: Four times a day (QID) | RESPIRATORY_TRACT | Status: DC

## 2019-02-25 MED ORDER — MORPHINE SULFATE (PF) 2 MG/ML IV SOLN
2.0000 mg | Freq: Once | INTRAVENOUS | Status: AC
  Administered 2019-02-25: 2 mg via INTRAVENOUS
  Filled 2019-02-25: qty 1

## 2019-02-25 MED ORDER — OXYCODONE-ACETAMINOPHEN 5-325 MG PO TABS
1.5000 | ORAL_TABLET | ORAL | Status: DC | PRN
  Administered 2019-02-25 – 2019-02-28 (×8): 1.5 via ORAL
  Filled 2019-02-25 (×9): qty 2

## 2019-02-25 MED ORDER — ALBUTEROL SULFATE (2.5 MG/3ML) 0.083% IN NEBU
2.5000 mg | INHALATION_SOLUTION | RESPIRATORY_TRACT | Status: DC | PRN
  Administered 2019-02-26 – 2019-02-27 (×2): 2.5 mg via RESPIRATORY_TRACT
  Filled 2019-02-25 (×2): qty 3

## 2019-02-25 MED ORDER — GUAIFENESIN ER 600 MG PO TB12
600.0000 mg | ORAL_TABLET | Freq: Two times a day (BID) | ORAL | Status: DC
  Filled 2019-02-25: qty 1

## 2019-02-25 MED ORDER — SODIUM CHLORIDE 0.9 % IV SOLN
500.0000 mg | INTRAVENOUS | Status: DC
  Administered 2019-02-25 – 2019-02-28 (×4): 500 mg via INTRAVENOUS
  Filled 2019-02-25 (×4): qty 500

## 2019-02-25 MED ORDER — INSULIN ASPART 100 UNIT/ML ~~LOC~~ SOLN
0.0000 [IU] | Freq: Three times a day (TID) | SUBCUTANEOUS | Status: DC
  Administered 2019-02-26: 1 [IU] via SUBCUTANEOUS
  Administered 2019-02-26: 3 [IU] via SUBCUTANEOUS
  Administered 2019-02-26 – 2019-02-27 (×2): 2 [IU] via SUBCUTANEOUS
  Administered 2019-02-27: 1 [IU] via SUBCUTANEOUS
  Administered 2019-02-27: 2 [IU] via SUBCUTANEOUS
  Administered 2019-02-28: 3 [IU] via SUBCUTANEOUS

## 2019-02-25 MED ORDER — NITROGLYCERIN 0.4 MG SL SUBL: 0.4000 mg | SUBLINGUAL_TABLET | SUBLINGUAL | Status: DC | PRN

## 2019-02-25 MED ORDER — ASPIRIN EC 81 MG PO TBEC
81.0000 mg | DELAYED_RELEASE_TABLET | Freq: Every morning | ORAL | Status: DC
  Administered 2019-02-25 – 2019-02-28 (×4): 81 mg via ORAL
  Filled 2019-02-25 (×4): qty 1

## 2019-02-25 MED ORDER — ONDANSETRON HCL 4 MG PO TABS: 4.0000 mg | ORAL_TABLET | Freq: Four times a day (QID) | ORAL | Status: DC | PRN

## 2019-02-25 MED ORDER — INSULIN ASPART 100 UNIT/ML ~~LOC~~ SOLN: 0.0000 [IU] | Freq: Every day | SUBCUTANEOUS | Status: DC

## 2019-02-25 MED ORDER — ACETAMINOPHEN 325 MG PO TABS
650.0000 mg | ORAL_TABLET | Freq: Four times a day (QID) | ORAL | Status: DC | PRN
  Administered 2019-02-25 – 2019-02-28 (×2): 650 mg via ORAL
  Filled 2019-02-25 (×2): qty 2

## 2019-02-25 MED ORDER — IPRATROPIUM-ALBUTEROL 0.5-2.5 (3) MG/3ML IN SOLN
3.0000 mL | Freq: Once | RESPIRATORY_TRACT | Status: AC
  Administered 2019-02-25: 3 mL via RESPIRATORY_TRACT
  Filled 2019-02-25: qty 3

## 2019-02-25 MED ORDER — SODIUM CHLORIDE 0.9% FLUSH: 3.0000 mL | INTRAVENOUS | Status: DC | PRN

## 2019-02-25 MED ORDER — METHYLPREDNISOLONE SODIUM SUCC 125 MG IJ SOLR
60.0000 mg | Freq: Three times a day (TID) | INTRAMUSCULAR | Status: DC
  Administered 2019-02-25 – 2019-02-27 (×7): 60 mg via INTRAVENOUS
  Filled 2019-02-25 (×8): qty 2

## 2019-02-25 MED ORDER — ONDANSETRON HCL 4 MG/2ML IJ SOLN
4.0000 mg | Freq: Four times a day (QID) | INTRAMUSCULAR | Status: DC | PRN
  Administered 2019-02-25 – 2019-02-27 (×3): 4 mg via INTRAVENOUS
  Filled 2019-02-25 (×3): qty 2

## 2019-02-25 MED ORDER — METOPROLOL TARTRATE 25 MG PO TABS
12.5000 mg | ORAL_TABLET | Freq: Two times a day (BID) | ORAL | Status: DC
  Administered 2019-02-25 – 2019-02-28 (×7): 12.5 mg via ORAL
  Filled 2019-02-25 (×7): qty 1

## 2019-02-25 MED ORDER — ALPRAZOLAM 0.25 MG PO TABS
0.2500 mg | ORAL_TABLET | Freq: Once | ORAL | Status: AC
  Administered 2019-02-25: 0.25 mg via ORAL
  Filled 2019-02-25: qty 1

## 2019-02-25 MED ORDER — SODIUM CHLORIDE 3 % IN NEBU
4.0000 mL | INHALATION_SOLUTION | Freq: Two times a day (BID) | RESPIRATORY_TRACT | Status: AC
  Administered 2019-02-25 – 2019-02-27 (×5): 4 mL via RESPIRATORY_TRACT
  Filled 2019-02-25 (×9): qty 4

## 2019-02-25 MED ORDER — ACETAMINOPHEN 650 MG RE SUPP: 650.0000 mg | Freq: Four times a day (QID) | RECTAL | Status: DC | PRN

## 2019-02-25 MED ORDER — FUROSEMIDE 40 MG PO TABS: 80.0000 mg | ORAL_TABLET | ORAL | Status: DC

## 2019-02-25 MED ORDER — IPRATROPIUM-ALBUTEROL 0.5-2.5 (3) MG/3ML IN SOLN
3.0000 mL | Freq: Four times a day (QID) | RESPIRATORY_TRACT | Status: DC
  Administered 2019-02-25 – 2019-02-28 (×13): 3 mL via RESPIRATORY_TRACT
  Filled 2019-02-25 (×14): qty 3

## 2019-02-25 MED ORDER — CALCIUM-VITAMIN D 500-200 MG-UNIT PO TABS
2.0000 | ORAL_TABLET | Freq: Two times a day (BID) | ORAL | Status: DC
  Administered 2019-02-25 – 2019-02-28 (×6): 2 via ORAL
  Filled 2019-02-25 (×11): qty 2

## 2019-02-25 MED ORDER — FUROSEMIDE 10 MG/ML IJ SOLN
40.0000 mg | Freq: Every day | INTRAMUSCULAR | Status: DC
  Administered 2019-02-25: 40 mg via INTRAVENOUS
  Filled 2019-02-25: qty 4

## 2019-02-25 NOTE — Telephone Encounter (Signed)
Pt admitted

## 2019-02-25 NOTE — ED Notes (Signed)
92% on 3l. Hr 118 while ambulating. edp notified of the same

## 2019-02-25 NOTE — Progress Notes (Signed)
Patient complaining of legs aching. Rating pain 10/10. Paged mid level. Mid level gave one time order for morphine refer to [facility] for correct dose. Will continue to monitor throughout shift.

## 2019-02-25 NOTE — ED Notes (Signed)
Neb still in process

## 2019-02-25 NOTE — Progress Notes (Signed)
Patient is asking for something for anxiety. Paged mid level. Will continue to monitor throughout shift.

## 2019-02-25 NOTE — Progress Notes (Signed)
Cindy [name] is a 63 y.o. female with medical history significant of adv copd on 3 liters cont oxygen at home, tobacco abuse, chronic diastolic chf, chronic pain, comes in with many complaints difficult to distinguish what the acute issue is.  Mainly complaining of sob and wheezing at home with more of a cough.   Patient has a history of chronic back pain which is been a little worse than usual.  She is been seen chronic pain clinic and has been refusing to get injections and her primary care physician has been managing her chronic Percocets.  This was recently increased.  Patient comes in because of shortness of breath and wheezing she received several nebs in the emergency department which is helped some but she still wheezing a lot she is been given IV Solu-Medrol.  She denies any chest pain or lower extremity edema.  Patient be referred for admission for COPD exacerbation.  02/25/19: seen and examined at her bedside in the ED. Reports dyspnea w minimal movement and a productive cough. Ordered pulm toilet and IV azithromycin for acute Copd ex, due to its antiinflammatory properties.  Wheezing on exam. Started on solumedrol.  Please refer to H&P dictated by my partner Dr Shanon Brow on 02/25/19 for further details of the assessment and plan.

## 2019-02-25 NOTE — H&P (Signed)
History and Physical    JULEA ABDON VFI:[id number] DOB: 1956-06-27 DOA: 02/24/2019  PCP: [name], MD  Patient coming from: home  Chief Complaint:  multiple  HPI: Cindy [name] is a 63 y.o. female with medical history significant of adv copd on 3 liters cont oxygen at home, tobacco abuse, chronic diastolic chf, chronic pain, comes in with many complaints difficult to distinguish what the acute issue is.  Mainly complaining of sob and wheezing at home with more of a cough.   Patient has a history of chronic back pain which is been a little worse than usual.  She is been seen chronic pain clinic and has been refusing to get injections and her primary care physician has been managing her chronic Percocets.  This was recently increased.  Patient comes in because of shortness of breath and wheezing she received several nebs in the emergency department which is helped some but she still wheezing a lot she is been given IV Solu-Medrol.  She denies any chest pain or lower extremity edema.  Patient be referred for admission for COPD exacerbation.    Review of Systems: As per HPI otherwise 10 point review of systems negative.   Past Medical History:  Diagnosis Date  . 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 Arthritis   . CAD (coronary artery disease)    a. NSTEMI 07/2018 s/p difficult PCI with overlapping DES to LAD, residual OM2 disease treated medically.  . Chronic diastolic CHF (congestive heart failure) (HCC)   . Chronic pain   . Chronic respiratory failure (HCC) On home 02 2-3L  . Compression fracture of lumbar vertebra (HCC)   . COPD (chronic obstructive pulmonary disease) (HCC)   . Essential hypertension   . Hyperglycemia, drug-induced   . Hyperlipidemia   . Kidney stones 2014   Left side, multiple  . Leukocytosis   . Myocardial infarct (HCC)   . Osteoporosis 2013  . Thyroid disease    pt denies  . Tobacco abuse     Past Surgical History:  Procedure Laterality Date  .  CERVICAL BIOPSY    . COLONOSCOPY N/A 03/31/2013   Procedure: COLONOSCOPY;  Surgeon: Malissa Hippo, MD;  Location: [facility];  Service: Endoscopy;  Laterality: N/A;  730  . CORONARY ATHERECTOMY N/A 08/19/2018   Procedure: CORONARY ATHERECTOMY;  Surgeon: Yvonne Kendall, MD;  Location: [facility];  Service: Cardiovascular;  Laterality: N/A;  . CORONARY STENT INTERVENTION N/A 08/19/2018   Procedure: CORONARY STENT INTERVENTION;  Surgeon: Yvonne Kendall, MD;  Location: [facility];  Service: Cardiovascular;  Laterality: N/A;  . INTRAVASCULAR ULTRASOUND/IVUS N/A 08/19/2018   Procedure: Intravascular Ultrasound/IVUS;  Surgeon: Yvonne Kendall, MD;  Location: [facility];  Service: Cardiovascular;  Laterality: N/A;  . LEFT HEART CATH N/A 08/19/2018   Procedure: Left Heart Cath;  Surgeon: Yvonne Kendall, MD;  Location: [facility];  Service: Cardiovascular;  Laterality: N/A;  . LEFT HEART CATH AND CORONARY ANGIOGRAPHY N/A 08/16/2018   Procedure: LEFT HEART CATH AND CORONARY ANGIOGRAPHY;  Surgeon: Yvonne Kendall, MD;  Location: [facility];  Service: Cardiovascular;  Laterality: N/A;  . TUBAL LIGATION       reports that she has been smoking cigarettes. She has a 25.00 pack-year smoking history. She has never used smokeless tobacco. She reports that she does not drink alcohol or use drugs.  Allergies  Allergen Reactions  . Keflex [Cephalexin] Anaphylaxis and Rash  . Sulfa  Antibiotics   . Avelox [Moxifloxacin Hcl In Nacl] Itching and Rash  . Toradol [Ketorolac Tromethamine] Swelling, Rash and Other (See Comments)    Bruising, swelling, rash at injection site    Family History  Problem Relation Age of Onset  . Heart disease Mother   . Hypertension Sister   . Hypertension Brother     Prior to Admission medications   Medication Sig Start Date End Date Taking? Authorizing Provider  albuterol (PROVENTIL HFA;VENTOLIN HFA) 108 (90 Base) MCG/ACT inhaler  Inhale 2 puffs into the lungs every 6 (six) hours as needed for wheezing or shortness of breath. 01/25/19  Yes [place], Velna Hatchet, MD  albuterol (PROVENTIL) (2.5 MG/3ML) 0.083% nebulizer solution 1 nebulizer every 4 hours as needed Patient taking differently: Take 2.5 mg by nebulization every 4 (four) hours as needed for wheezing or shortness of breath.  01/25/19  Yes [place], Velna Hatchet, MD  alendronate (FOSAMAX) 70 MG tablet TAKE 1 TAB BY MOUTH EVERY WEEK ON AN EMPTY STOMACH. Patient taking differently: Take 70 mg by mouth every Wednesday.  10/20/18  Yes [place], Velna Hatchet, MD  aspirin EC 81 MG tablet Take 81 mg by mouth every morning.    Yes [provider]  atorvastatin (LIPITOR) 80 MG tablet Take 1 tablet (80 mg total) by mouth daily. Patient taking differently: Take 80 mg by mouth every evening.  12/13/18 12/08/19 Yes Laqueta Linden, MD  Calcium Carb-Cholecalciferol 600-800 MG-UNIT TABS Take 1 tablet by mouth 2 (two) times daily.    Yes [provider]  clopidogrel (PLAVIX) 75 MG tablet Take 1 tablet (75 mg total) by mouth daily. 09/08/18  Yes Dunn, Dayna N, PA-C  diphenhydrAMINE (BENADRYL) 12.5 MG/5ML liquid Take 6.25 mg by mouth 4 (four) times daily as needed.   Yes [provider]  furosemide (LASIX) 40 MG tablet Alternate taking 80mg  one day, then take 40mg  the next day, then repeat. Patient taking differently: Take 40-80 mg by mouth See admin instructions. Alternate taking 80mg  one day, then take 40mg  the next day, then repeat. 11/19/18  Yes [place], Velna Hatchet, MD  Magnesium 250 MG TABS Take 1 tablet daily Patient taking differently: Take 250 mg by mouth daily.  11/17/18  Yes [place], Velna Hatchet, MD  metoprolol tartrate (LOPRESSOR) 25 MG tablet Take 0.5 tablets (12.5 mg total) by mouth 2 (two) times daily. 10/11/18  Yes Dyann Kief, PA-C  nitroGLYCERIN (NITROSTAT) 0.4 MG SL tablet Place 1 tablet (0.4 mg total) under the tongue every 5 (five) minutes as needed  for chest pain. If no improvement with 3 doses call 911 08/27/18  Yes Danelle Berry, PA-C  nystatin (MYCOSTATIN) 100000 UNIT/ML suspension TAKE 5 ML BY MOUTH FOUR TIMES A DAY AS DIRECTED. Patient taking differently: Take 5 mLs by mouth 4 (four) times daily.  10/04/18  Yes [place], Velna Hatchet, MD  oxyCODONE-acetaminophen (PERCOCET) 7.5-325 MG tablet Take 1 tablet by mouth every 4 (four) hours as needed for severe pain. Patient taking differently: Take 0.25-1 tablets by mouth every 4 (four) hours as needed for severe pain.  01/25/19  Yes [place], Velna Hatchet, MD  OXYGEN Inhale 3 L into the lungs continuous.    Yes [provider]  pantoprazole (PROTONIX) 40 MG tablet TAKE ONE TABLET BY MOUTH TWICE A DAY 11/17/18  Yes , Velna Hatchet, MD  polyethylene glycol [facility] / GLYCOLAX) packet Take 17 g by mouth daily. Patient taking differently: Take 17 g by mouth daily as needed for mild constipation or  moderate constipation.  06/04/18  Yes Derwood Kaplan, MD  potassium chloride SA (K-DUR,KLOR-CON) 20 MEQ tablet TAKE 1 TABLET BY MOUTH ONCE A DAY. Patient taking differently: Take 20 mEq by mouth daily.  11/17/18  Yes [place], Velna Hatchet, MD  predniSONE (DELTASONE) 10 MG tablet Take 1 tablet (10 mg total) by mouth daily with breakfast. 11/23/18  Yes [place], Velna Hatchet, MD  SPIRIVA RESPIMAT 2.5 MCG/ACT AERS INHALE 2 PUFFS INTO THE LUNGS ONCE DAILY. Patient taking differently: Inhale 2 puffs into the lungs at bedtime.  11/17/18  Yes [place], Velna Hatchet, MD  SYMBICORT 160-4.5 MCG/ACT inhaler INHALE 2 PUFFS INTO THE LUNGS TWICE DAILY. Patient taking differently: Inhale 2 puffs into the lungs 2 (two) times daily.  11/17/18  Yes [place], Velna Hatchet, MD  fluticasone (FLONASE) 50 MCG/ACT nasal spray INSTILL 1 SPRAY INTO EACH NOSTRIL DAILY AS NEEDED FOR ALLERGIES. Patient not taking: Reported on 02/24/2019 01/13/19   [name], MD  loratadine (CLARITIN) 10 MG tablet Take 1 tablet (10 mg total) by mouth daily. Patient  not taking: Reported on 02/24/2019 01/22/19   Loren Racer, MD  mupirocin nasal ointment [facility] NASAL) 2 % Apply to nares twice a day for 5 days Patient not taking: Reported on 02/24/2019 01/25/19   [name], MD    Physical Exam: Vitals:   02/25/19 0230 02/25/19 0311 02/25/19 0319 02/25/19 0422  BP:  (!) 143/81    Pulse: (!) 106 (!) 104    Resp:   19   Temp:      TempSrc:      SpO2: 100% 99%  97%  Weight:      Height:          Constitutional: NAD, calm, comfortable Vitals:   02/25/19 0230 02/25/19 0311 02/25/19 0319 02/25/19 0422  BP:  (!) 143/81    Pulse: (!) 106 (!) 104    Resp:   19   Temp:      TempSrc:      SpO2: 100% 99%  97%  Weight:      Height:       Eyes: PERRL, lids and conjunctivae normal ENMT: Mucous membranes are moist. Posterior pharynx clear of any exudate or lesions.Normal dentition.  Neck: normal, supple, no masses, no thyromegaly Respiratory: clear to auscultation bilaterally,diffuse expiratory wheezing, no crackles. Normal respiratory effort. No accessory muscle use.  Cardiovascular: Regular rate and rhythm, no murmurs / rubs / gallops. No extremity edema. 2+ pedal pulses. No carotid bruits.  Abdomen: no tenderness, no masses palpated. No hepatosplenomegaly. Bowel sounds positive.  Musculoskeletal: no clubbing / cyanosis. No joint deformity upper and lower extremities. Good ROM, no contractures. Normal muscle tone.  Skin: no rashes, lesions, ulcers. No induration Neurologic: CN 2-12 grossly intact. Sensation intact, DTR normal. Strength 5/5 in all 4.  Psychiatric: Normal judgment and insight. Alert and oriented x 3. Normal mood.    Labs on Admission: I have personally reviewed following labs and imaging studies  CBC: Recent Labs  Lab 02/24/19 2235  WBC 19.0*  NEUTROABS 13.1*  HGB 12.5  HCT 41.6  MCV 98.1  PLT 303   Basic Metabolic Panel: Recent Labs  Lab 02/24/19 2235  NA 139  K 3.2*  CL 91*  CO2 38*  GLUCOSE 164*  BUN  11  CREATININE 0.88  CALCIUM 9.3   GFR: Estimated Creatinine Clearance: 66.2 mL/min (by C-G formula based on SCr of 0.88 mg/dL). Liver Function Tests: Recent Labs  Lab 02/24/19 2235  AST 26  ALT 28  ALKPHOS 85  BILITOT 0.3  PROT 6.6  ALBUMIN 3.7   Recent Labs  Lab 02/24/19 2235  LIPASE 45   No results for input(s): AMMONIA in the last 168 hours. Coagulation Profile: No results for input(s): INR, PROTIME in the last 168 hours. Cardiac Enzymes: Recent Labs  Lab 02/24/19 2235  TROPONINI <0.03   BNP (last 3 results) No results for input(s): PROBNP in the last 8760 hours. HbA1C: No results for input(s): HGBA1C in the last 72 hours. CBG: No results for input(s): GLUCAP in the last 168 hours. Lipid Profile: No results for input(s): CHOL, HDL, LDLCALC, TRIG, CHOLHDL, LDLDIRECT in the last 72 hours. Thyroid Function Tests: No results for input(s): TSH, T4TOTAL, FREET4, T3FREE, THYROIDAB in the last 72 hours. Anemia Panel: No results for input(s): VITAMINB12, FOLATE, FERRITIN, TIBC, IRON, RETICCTPCT in the last 72 hours. Urine analysis:    Component Value Date/Time   COLORURINE YELLOW 02/25/2019 0218   APPEARANCEUR HAZY (A) 02/25/2019 0218   LABSPEC 1.013 02/25/2019 0218   PHURINE 6.0 02/25/2019 0218   GLUCOSEU NEGATIVE 02/25/2019 0218   HGBUR LARGE (A) 02/25/2019 0218   BILIRUBINUR NEGATIVE 02/25/2019 0218   KETONESUR NEGATIVE 02/25/2019 0218   PROTEINUR NEGATIVE 02/25/2019 0218   UROBILINOGEN 0.2 04/15/2014 1949   NITRITE NEGATIVE 02/25/2019 0218   LEUKOCYTESUR TRACE (A) 02/25/2019 0218   Sepsis Labs: !!!!!!!!!!!!!!!!!!!!!!!!!!!!!!!!!!!!!!!!!!!! @LABRCNTIP (procalcitonin:4,lacticidven:4) )No results found for this or any previous visit (from the past 240 hour(s)).   Radiological Exams on Admission: Dg Chest 2 View  Result Date: 02/25/2019 CLINICAL DATA:  Increasing shortness of breath. Former smoker. EXAM: CHEST - 2 VIEW COMPARISON:  01/22/2019 FINDINGS:  Emphysematous changes in the lungs. Central peribronchial thickening and interstitial changes likely representing chronic bronchitis. No consolidation or airspace disease. No blunting of costophrenic angles. No pneumothorax. Normal heart size and pulmonary vascularity. Coronary artery stent. Calcification of the aorta. Compression of a lower thoracic vertebra, unchanged. IMPRESSION: Emphysematous and chronic bronchitic changes in the lungs. No evidence of active pulmonary disease. Electronically Signed   By: Burman Nieves M.D.   On: 02/25/2019 02:53   Ct Abdomen Pelvis W Contrast  Result Date: 02/25/2019 CLINICAL DATA:  63 year old female with abdominal pain. EXAM: CT ABDOMEN AND PELVIS WITH CONTRAST TECHNIQUE: Multidetector CT imaging of the abdomen and pelvis was performed using the standard protocol following bolus administration of intravenous contrast. CONTRAST:  OMNIPAQUE IOHEXOL 300 MG/ML  SOLN COMPARISON:  CT of the abdomen pelvis dated 05/25/2018 FINDINGS: Lower chest: The visualized lung bases are clear. There is coronary vascular calcification. No intra-abdominal free air or free fluid. Hepatobiliary: The liver is unremarkable. No intrahepatic biliary ductal dilatation. There is a lobulated and septated appearance of the gallbladder fundus which may represent adenomyomatosis. This finding is somewhat similar to the prior CT. A gallbladder mass is not excluded. Further evaluation with ultrasound on a nonemergent basis recommended. No calcified stone. A 7 mm nodular density in the gallbladder fundus may represent an adherent noncalcified stone or a polyp. This is similar to prior CT. Pancreas: Unremarkable. No pancreatic ductal dilatation or surrounding inflammatory changes. Spleen: Normal in size without focal abnormality. Adrenals/Urinary Tract: Left adrenal thickening/hyperplasia or adenoma. This is similar to prior CT. The right adrenal gland is unremarkable. There are multiple  nonobstructing bilateral renal calculi measure up to 8 mm in the upper pole of the right kidney. There is no hydronephrosis on either side. The visualized ureters and urinary bladder appear unremarkable. There is symmetric enhancement  and excretion of contrast by both kidneys. Stomach/Bowel: There is moderate stool throughout the colon. There is no bowel obstruction or active inflammation. There is sigmoid diverticulosis without active inflammatory changes. There is a 4 cm duodenal diverticulum. Normal appendix. Vascular/Lymphatic: Moderate aortoiliac atherosclerotic disease. The aorta is tortuous. No portal venous gas. There is no adenopathy. Reproductive: The uterus and ovaries are grossly unremarkable. No pelvic mass. Other: None Musculoskeletal: Osteopenia with degenerative changes of the spine. There is compression fracture of the L3 inferior endplate with approximately 50% loss of vertebral body height, similar to prior study. Age indeterminate compression fracture of L1 inferior endplate, new since the prior CT. There is compression fracture of T11 with near complete loss of vertebral body height centrally and anterior wedging, age indeterminate. IMPRESSION: 1. No bowel obstruction or active inflammation. Normal appendix. 2. Sigmoid diverticulosis. 3. Bilateral nonobstructing renal calculi. No hydronephrosis. 4. 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 Further evaluation with ultrasound on a nonemergent basis recommended to exclude a gallbladder mass. 5. Age indeterminate compression fractures of L1 and T11. Clinical correlation is recommended. Electronically Signed   By: Elgie Collard M.D.   On: 02/25/2019 02:55   Old chart reveiwed Case discussed with dr Manus Gunning cxr reviewed no edema or infiltrate  Assessment/Plan 63 yo female with acute copde h/o chronic resp failure   Principal Problem:   COPD exacerbation (HCC)- freq nebs.  Iv solumedrol.  Stop smoking.  On  chronic steroids.  O2 sats normal on her chronic 3 L.  Patient in no respiratory distress.  Observe overnight on MedSurg.  Active Problems:   Chronic hypoxemic respiratory failure (HCC)- on 3 liters at home    Essential hypertension- cont home meds    Chronic diastolic CHF (congestive heart failure) (HCC)- cont home lasix    CAD (coronary artery disease)- stable    Chronic pain- cont chronic meds, avoid escalating pain meds    DVT prophylaxis: scds Code Status: full Family Communication:  husband Disposition Plan:  A day Consults called:  none Admission status:  observation   Anacleto Batterman A MD [facility]  If 7PM-7AM, please contact night-coverage [email or web address] Password [facility]  02/25/2019, 4:27 AM

## 2019-02-26 LAB — CBC
HEMATOCRIT: 43 % (ref 36.0–46.0)
Hemoglobin: 12.8 g/dL (ref 12.0–15.0)
MCH: 28.8 pg (ref 26.0–34.0)
MCHC: 29.8 g/dL — ABNORMAL LOW (ref 30.0–36.0)
MCV: 96.6 fL (ref 80.0–100.0)
Platelets: 338 10*3/uL (ref 150–400)
RBC: 4.45 MIL/uL (ref 3.87–5.11)
RDW: 14.8 % (ref 11.5–15.5)
WBC: 22.1 10*3/uL — AB (ref 4.0–10.5)
nRBC: 0 % (ref 0.0–0.2)

## 2019-02-26 LAB — BASIC METABOLIC PANEL
Anion gap: 13 (ref 5–15)
BUN: 17 mg/dL (ref 8–23)
CALCIUM: 9.9 mg/dL (ref 8.9–10.3)
CO2: 37 mmol/L — ABNORMAL HIGH (ref 22–32)
Chloride: 87 mmol/L — ABNORMAL LOW (ref 98–111)
Creatinine, Ser: 0.92 mg/dL (ref 0.44–1.00)
GFR calc non Af Amer: >60 mL/min (ref >60)
Glucose, Bld: 156 mg/dL — ABNORMAL HIGH (ref 70–99)
Potassium: 4 mmol/L (ref 3.5–5.1)
SODIUM: 137 mmol/L (ref 135–145)

## 2019-02-26 LAB — PHOSPHORUS: PHOSPHORUS: 3.8 mg/dL (ref 2.5–4.6)

## 2019-02-26 LAB — GLUCOSE, CAPILLARY
Glucose-Capillary: 148 mg/dL — ABNORMAL HIGH (ref 70–99)
Glucose-Capillary: 206 mg/dL — ABNORMAL HIGH (ref 70–99)
Glucose-Capillary: 179 mg/dL — ABNORMAL HIGH (ref 70–99)
Glucose-Capillary: 192 mg/dL — ABNORMAL HIGH (ref 70–99)

## 2019-02-26 LAB — MAGNESIUM: Magnesium: 2 mg/dL (ref 1.7–2.4)

## 2019-02-26 LAB — HEMOGLOBIN A1C
Hgb A1c MFr Bld: 7.8 % — ABNORMAL HIGH (ref 4.8–5.6)
Mean Plasma Glucose: 177.16 mg/dL

## 2019-02-26 MED ORDER — ENOXAPARIN SODIUM 40 MG/0.4ML ~~LOC~~ SOLN
40.0000 mg | SUBCUTANEOUS | Status: DC
  Administered 2019-02-27: 40 mg via SUBCUTANEOUS
  Filled 2019-02-26: qty 0.4

## 2019-02-26 MED ORDER — FLUTICASONE PROPIONATE 50 MCG/ACT NA SUSP
2.0000 | Freq: Every day | NASAL | Status: DC
  Filled 2019-02-26: qty 16

## 2019-02-26 MED ORDER — SENNOSIDES-DOCUSATE SODIUM 8.6-50 MG PO TABS
2.0000 | ORAL_TABLET | Freq: Two times a day (BID) | ORAL | Status: DC
  Administered 2019-02-26 – 2019-02-28 (×5): 2 via ORAL
  Filled 2019-02-26 (×5): qty 2

## 2019-02-26 MED ORDER — FUROSEMIDE 10 MG/ML IJ SOLN
40.0000 mg | Freq: Two times a day (BID) | INTRAMUSCULAR | Status: DC
  Administered 2019-02-26 – 2019-02-28 (×5): 40 mg via INTRAVENOUS
  Filled 2019-02-26 (×5): qty 4

## 2019-02-26 MED ORDER — NYSTATIN 100000 UNIT/ML MT SUSP
5.0000 mL | Freq: Four times a day (QID) | OROMUCOSAL | Status: DC
  Administered 2019-02-26 – 2019-02-28 (×8): 500000 [IU] via ORAL
  Filled 2019-02-26 (×7): qty 5

## 2019-02-26 MED ORDER — TIOTROPIUM BROMIDE MONOHYDRATE 2.5 MCG/ACT IN AERS: 2.0000 | INHALATION_SPRAY | Freq: Every day | RESPIRATORY_TRACT | Status: DC

## 2019-02-26 MED ORDER — POLYETHYLENE GLYCOL 3350 17 G PO PACK
17.0000 g | PACK | Freq: Every day | ORAL | Status: DC
  Administered 2019-02-26 – 2019-02-28 (×3): 17 g via ORAL
  Filled 2019-02-26 (×3): qty 1

## 2019-02-26 MED ORDER — MORPHINE SULFATE (PF) 2 MG/ML IV SOLN
2.0000 mg | INTRAVENOUS | Status: DC | PRN
  Administered 2019-02-26 – 2019-02-28 (×7): 2 mg via INTRAVENOUS
  Filled 2019-02-26 (×7): qty 1

## 2019-02-26 MED ORDER — BISACODYL 10 MG RE SUPP
10.0000 mg | Freq: Once | RECTAL | Status: AC
  Administered 2019-02-26: 10 mg via RECTAL
  Filled 2019-02-26: qty 1

## 2019-02-26 MED ORDER — ALUM & MAG HYDROXIDE-SIMETH 200-200-20 MG/5ML PO SUSP
15.0000 mL | Freq: Once | ORAL | Status: AC
  Administered 2019-02-26: 15 mL via ORAL
  Filled 2019-02-26: qty 30

## 2019-02-26 MED ORDER — ALENDRONATE SODIUM 70 MG PO TABS: 70.0000 mg | ORAL_TABLET | ORAL | Status: DC

## 2019-02-26 MED ORDER — ATORVASTATIN CALCIUM 40 MG PO TABS
80.0000 mg | ORAL_TABLET | Freq: Every evening | ORAL | Status: DC
  Administered 2019-02-26 – 2019-02-27 (×2): 80 mg via ORAL
  Filled 2019-02-26 (×2): qty 2

## 2019-02-26 MED ORDER — MOMETASONE FURO-FORMOTEROL FUM 200-5 MCG/ACT IN AERO
2.0000 | INHALATION_SPRAY | Freq: Two times a day (BID) | RESPIRATORY_TRACT | Status: DC
  Administered 2019-02-26: 2 via RESPIRATORY_TRACT
  Filled 2019-02-26: qty 8.8

## 2019-02-26 MED ORDER — LIDOCAINE VISCOUS HCL 2 % MT SOLN
15.0000 mL | Freq: Four times a day (QID) | OROMUCOSAL | Status: DC | PRN
  Administered 2019-02-27: 15 mL via OROMUCOSAL
  Filled 2019-02-26: qty 15

## 2019-02-26 MED ORDER — UMECLIDINIUM BROMIDE 62.5 MCG/INH IN AEPB
1.0000 | INHALATION_SPRAY | Freq: Every day | RESPIRATORY_TRACT | Status: DC
  Filled 2019-02-26: qty 7

## 2019-02-26 NOTE — Progress Notes (Addendum)
PROGRESS NOTE  Cindy [name]:[id number] DOB: [birth year] DOA: 02/24/2019 PCP: Alycia Rossetti, MD  HPI/Recap of past 24 hours: Cindy [place] Pierceis a 63 y.o.femalewith medical history significant ofadv copd on 3 liters cont oxygen at home, tobacco abuse, chronic diastolic chf, chronic pain, comes in with many complaints difficult to distinguish what the acute issue is. Mainly complaining of sob and wheezing at home with more of a cough. Patient has a history of chronic back pain which is been a little worse than usual. She is been seen chronic pain clinic and has been refusing to get injections and her primary care physician has been managing her chronic Percocets. This was recently increased. Patient comes in because of shortness of breath and wheezing she received several nebs in the emergency department which is helped some but she still wheezing a lot she is been given IV Solu-Medrol. She denies any chest pain or lower extremity edema. Patient be referred for admission for COPD exacerbation.  02/26/19: Reports breathing is improving but persistent bilateral lower extremity swelling.  Also oral thrush with burning when she swallows.  Started mouthwash with lidocaine solution.    Assessment/Plan: Principal Problem:   COPD exacerbation (HCC) Active Problems:   Chronic hypoxemic respiratory failure (HCC)   Essential hypertension   Chronic diastolic CHF (congestive heart failure) (HCC)   CAD (coronary artery disease)   Chronic pain  Acute COPD exacerbation Ongoing tobacco user Continue IV Solu-Medrol Continue IV azithromycin for anti-inflammatory properties in the setting of acute COPD exacerbation Tobacco cessation counseling done at bedside Chest x-ray done on admission 02/25/2019 revealed emphysematous and chronic bronchitic changes in the lungs no evidence of active pulmonary disease Continue nebs Continue pulmonary toilet Maintain O2 saturation greater than 92% 3 L  oxygen by nasal cannula continuously at baseline  Acute on chronic hypoxic respiratory failure secondary to acute COPD exacerbation Management as stated above  Oral thrush Start mouthwash with lidocaine solution Continue to monitor  T11, L1, L3 compression fractures L3 compression fracture appears to be chronic Continue home pain medications, on Percocet Morphine IV 2 mg every 4 H as needed for severe pain Bowel regimen in place to avoid opiate-induced constipation On Senokot 2 tablet twice daily and MiraLAX daily  Type 2 diabetes with hyperglycemia in the setting of steroid use Hemoglobin A1c 7.8 Continue insulin sliding scale On IV steroids due to acute COPD exacerbation  Chronic diastolic CHF Continue Lasix Strict I's and O's and daily weight Continue cardiac medications  Coronary artery disease Stable Continue cardiac medications  Hyperlipidemia Continue statin  Essential hypertension Blood pressure is at goal Continue antihypertensive medications  DVT prophylaxis: Subcu Lovenox daily Code Status: full  Family Communication:   None at bedside Disposition Plan:   Home in 2 days when clinically stable Consults called:  none   Objective: Vitals:   02/26/19 0639 02/26/19 1138 02/26/19 1428 02/26/19 1440  BP:   125/79   Pulse:   98   Resp:   20   Temp:   (!) 97.3 F (36.3 C)   TempSrc:   Oral   SpO2: 96% (!) 88% 95% 90%  Weight:      Height:        Intake/Output Summary (Last 24 hours) at 02/26/2019 1521 Last data filed at 02/26/2019 0900 Gross per 24 hour  Intake 490 ml  Output -  Net 490 ml   Filed Weights   02/24/19 2113 02/24/19 2115  Weight: 86.6 kg 86.6 kg  Exam:  . General: 63 y.o. year-old female well developed well nourished in no acute distress.  Alert and oriented x3. . Cardiovascular: Regular rate and rhythm with no rubs or gallops.  No thyromegaly or JVD noted.   Marland Kitchen Respiratory: Diffused wheezes bilaterally.  Good inspiratory  effort. . Abdomen: Soft nontender nondistended with normal bowel sounds x4 quadrants. . Musculoskeletal: 1+ pitting edema lower extremity. 2/4 pulses in all 4 extremities. Marland Kitchen Psychiatry: Mood is appropriate for condition and setting   Data Reviewed: CBC: Recent Labs  Lab 02/24/19 2235 02/25/19 0548 02/26/19 0653  WBC 19.0* 19.7* 22.1*  NEUTROABS 13.1*  --   --   HGB 12.5 12.1 12.8  HCT 41.6 41.2 43.0  MCV 98.1 97.9 96.6  PLT 303 297 989   Basic Metabolic Panel: Recent Labs  Lab 02/24/19 2235 02/25/19 0548 02/26/19 0653  NA 139 136 137  K 3.2* 4.2 4.0  CL 91* 89* 87*  CO2 38* 34* 37*  GLUCOSE 164* 282* 156*  BUN 11 14 17   CREATININE 0.88 0.91 0.92  CALCIUM 9.3 9.5 9.9  MG  --   --  2.0  PHOS  --   --  3.8   GFR: Estimated Creatinine Clearance: 63.4 mL/min (by C-G formula based on SCr of 0.92 mg/dL). Liver Function Tests: Recent Labs  Lab 02/24/19 2235  AST 26  ALT 28  ALKPHOS 85  BILITOT 0.3  PROT 6.6  ALBUMIN 3.7   Recent Labs  Lab 02/24/19 2235  LIPASE 45   No results for input(s): AMMONIA in the last 168 hours. Coagulation Profile: No results for input(s): INR, PROTIME in the last 168 hours. Cardiac Enzymes: Recent Labs  Lab 02/24/19 2235  TROPONINI <0.03   BNP (last 3 results) No results for input(s): PROBNP in the last 8760 hours. HbA1C: Recent Labs    02/25/19 0548  HGBA1C 7.8*   CBG: Recent Labs  Lab 02/25/19 2214 02/26/19 0737 02/26/19 1123  GLUCAP 160* 148* 206*   Lipid Profile: No results for input(s): CHOL, HDL, LDLCALC, TRIG, CHOLHDL, LDLDIRECT in the last 72 hours. Thyroid Function Tests: No results for input(s): TSH, T4TOTAL, FREET4, T3FREE, THYROIDAB in the last 72 hours. Anemia Panel: No results for input(s): VITAMINB12, FOLATE, FERRITIN, TIBC, IRON, RETICCTPCT in the last 72 hours. Urine analysis:    Component Value Date/Time   COLORURINE YELLOW 02/25/2019 0218   APPEARANCEUR HAZY (A) 02/25/2019 0218   LABSPEC  1.013 02/25/2019 0218   PHURINE 6.0 02/25/2019 0218   GLUCOSEU NEGATIVE 02/25/2019 0218   HGBUR LARGE (A) 02/25/2019 0218   BILIRUBINUR NEGATIVE 02/25/2019 0218   KETONESUR NEGATIVE 02/25/2019 0218   PROTEINUR NEGATIVE 02/25/2019 0218   UROBILINOGEN 0.2 04/15/2014 1949   NITRITE NEGATIVE 02/25/2019 0218   LEUKOCYTESUR TRACE (A) 02/25/2019 0218   Sepsis Labs: @LABRCNTIP (procalcitonin:4,lacticidven:4)  )No results found for this or any previous visit (from the past 240 hour(s)).    Studies: No results found.  Scheduled Meds: . aspirin EC  81 mg Oral q morning - 10a  . atorvastatin  80 mg Oral QPM  . bisacodyl  10 mg Rectal Once  . calcium-vitamin D  2 tablet Oral BID  . clopidogrel  75 mg Oral Daily  . fluticasone  2 spray Each Nare Daily  . furosemide  40 mg Intravenous BID  . guaiFENesin  1,200 mg Oral BID  . insulin aspart  0-5 Units Subcutaneous QHS  . insulin aspart  0-9 Units Subcutaneous TID WC  . ipratropium-albuterol  3 mL Nebulization Q6H  . methylPREDNISolone (SOLU-MEDROL) injection  60 mg Intravenous TID  . metoprolol tartrate  12.5 mg Oral BID  . mometasone-formoterol  2 puff Inhalation BID  . nystatin  5 mL Oral QID  . pantoprazole  40 mg Oral BID  . polyethylene glycol  17 g Oral Daily  . senna-docusate  2 tablet Oral BID  . sodium chloride flush  3 mL Intravenous Q12H  . sodium chloride HYPERTONIC  4 mL Nebulization BID  . umeclidinium bromide  1 puff Inhalation Daily    Continuous Infusions: . sodium chloride    . azithromycin 500 mg (02/26/19 0837)     LOS: 1 day     Kayleen Memos, MD [facility] Pager [phone]  If 7PM-7AM, please contact night-coverage [email or web address] Password [email or web address] 02/26/2019, 3:21 PM

## 2019-02-26 NOTE — Progress Notes (Signed)
Patient is requesting symbicort inhaler. Nurse paged mid level beginning of shift he refused to change med.

## 2019-02-27 LAB — CBC WITH DIFFERENTIAL/PLATELET
ABS IMMATURE GRANULOCYTES: 0.13 10*3/uL — AB (ref 0.00–0.07)
BASOS ABS: 0 10*3/uL (ref 0.0–0.1)
Basophils Relative: 0 %
Eosinophils Absolute: 0 10*3/uL (ref 0.0–0.5)
Eosinophils Relative: 0 %
HCT: 39.3 % (ref 36.0–46.0)
Hemoglobin: 12 g/dL (ref 12.0–15.0)
IMMATURE GRANULOCYTES: 1 %
Lymphocytes Relative: 6 %
Lymphs Abs: 1 10*3/uL (ref 0.7–4.0)
MCH: 29.1 pg (ref 26.0–34.0)
MCHC: 30.5 g/dL (ref 30.0–36.0)
MCV: 95.2 fL (ref 80.0–100.0)
Monocytes Absolute: 1.2 10*3/uL — ABNORMAL HIGH (ref 0.1–1.0)
Monocytes Relative: 7 %
NEUTROS PCT: 86 %
NRBC: 0 % (ref 0.0–0.2)
Neutro Abs: 15.3 10*3/uL — ABNORMAL HIGH (ref 1.7–7.7)
Platelets: 307 10*3/uL (ref 150–400)
RBC: 4.13 MIL/uL (ref 3.87–5.11)
RDW: 14.9 % (ref 11.5–15.5)
WBC: 17.7 10*3/uL — ABNORMAL HIGH (ref 4.0–10.5)

## 2019-02-27 LAB — GLUCOSE, CAPILLARY
Glucose-Capillary: 124 mg/dL — ABNORMAL HIGH (ref 70–99)
Glucose-Capillary: 199 mg/dL — ABNORMAL HIGH (ref 70–99)
Glucose-Capillary: 189 mg/dL — ABNORMAL HIGH (ref 70–99)

## 2019-02-27 LAB — PROCALCITONIN: Procalcitonin: <0.1 ng/mL

## 2019-02-27 LAB — LACTIC ACID, PLASMA: LACTIC ACID, VENOUS: 2.2 mmol/L — AB (ref 0.5–1.9)

## 2019-02-27 MED ORDER — LIDOCAINE 5 % EX PTCH
2.0000 | MEDICATED_PATCH | CUTANEOUS | Status: DC
  Administered 2019-02-27 – 2019-02-28 (×2): 2 via TRANSDERMAL
  Filled 2019-02-27 (×2): qty 2

## 2019-02-27 MED ORDER — ALUM & MAG HYDROXIDE-SIMETH 200-200-20 MG/5ML PO SUSP
15.0000 mL | ORAL | Status: DC | PRN
  Administered 2019-02-27 – 2019-02-28 (×3): 15 mL via ORAL
  Filled 2019-02-27 (×4): qty 30

## 2019-02-27 MED ORDER — METHYLPREDNISOLONE SODIUM SUCC 125 MG IJ SOLR
80.0000 mg | Freq: Three times a day (TID) | INTRAMUSCULAR | Status: AC
  Administered 2019-02-27 – 2019-02-28 (×3): 80 mg via INTRAVENOUS
  Filled 2019-02-27 (×2): qty 2

## 2019-02-27 MED ORDER — METHYLPREDNISOLONE SODIUM SUCC 125 MG IJ SOLR: 80.0000 mg | Freq: Three times a day (TID) | INTRAMUSCULAR | Status: DC

## 2019-02-27 NOTE — Progress Notes (Signed)
PROGRESS NOTE  Cindy [name] JJO:[id number] DOB: May 11, 1956 DOA: 02/24/2019 PCP: Alycia Rossetti, MD  HPI/Recap of past 24 hours: Cindy [place] Pierceis a 63 y.o.femalewith medical history significant ofadv copd on 3 liters cont oxygen at home, tobacco abuse, chronic diastolic chf, chronic pain, comes in with many complaints difficult to distinguish what the acute issue is. Mainly complaining of sob and wheezing at home with more of a cough. Patient has a history of chronic back pain which is been a little worse than usual. She is been seen chronic pain clinic and has been refusing to get injections and her primary care physician has been managing her chronic Percocets. This was recently increased. Patient comes in because of shortness of breath and wheezing she received several nebs in the emergency department which is helped some but she still wheezing a lot she is been given IV Solu-Medrol. She denies any chest pain or lower extremity edema. Patient be referred for admission for COPD exacerbation.  02/26/19: Reports breathing is improving but persistent bilateral lower extremity swelling.  Also oral thrush with burning when she swallows.  Started mouthwash with lidocaine solution.  02/27/19: Patient seen and examined with her husband at her bedside.  Sinus tach overnight.  Denies chest pain.  Reports persistent congestion with audible wheezing.    Assessment/Plan: Principal Problem:   COPD exacerbation (HCC) Active Problems:   Chronic hypoxemic respiratory failure (HCC)   Essential hypertension   Chronic diastolic CHF (congestive heart failure) (HCC)   CAD (coronary artery disease)   Chronic pain  Acute COPD exacerbation Ongoing tobacco user Increased dose of IV Solu-Medrol for audible wheezing 80 mg 3 times daily x1 day Continue Protonix 40 mg p.o. twice daily for GI prophylaxis Continue IV azithromycin for anti-inflammatory properties in the setting of acute COPD exacerbation  x 5 days Tobacco cessation counseling done at bedside Chest x-ray done on admission 02/25/2019 revealed emphysematous and chronic bronchitic changes in the lungs no evidence of active pulmonary disease Continue nebs Continue flutter valve every 4 hours Continue pulmonary toilet Maintain O2 saturation greater than 92% 3 L oxygen by nasal cannula continuously at baseline Tessalon Perles for cough  Acute on chronic hypoxic respiratory failure secondary to acute COPD exacerbation Management as stated above Start continuous pulse oximetry  Leukocytosis, suspect reactive from IV steroids No active sign of infection We will obtain procalcitonin and lactic acid to confirm Afebrile WBC 22K on 02/26/2019 Repeat CBC  Sinus tachycardia Independently reviewed EKG done on revealed sinus tachycardia rate of 106 with no specific ST-T changes Asymptomatic Denies chest pain Placed on telemetry Continue Lopressor p.o. 12.5 mg twice daily   Oral thrush Continue mouthwash with lidocaine solution Continue to monitor  T11, L1, L3 compression fractures L3 compression fracture appears to be chronic Continue home pain medications, on Percocet Morphine IV 2 mg every 4 H as needed for severe pain Bowel regimen in place to avoid opiate-induced constipation On Senokot 2 tablet twice daily and MiraLAX daily Once respiratory status is improved may consider vertebral kyphoplasty for pain control  Type 2 diabetes with hyperglycemia in the setting of steroid use Hemoglobin A1c 7.8 Continue insulin sliding scale On IV steroids due to acute COPD exacerbation Adjust insulin doses as indicated in the setting of high doses of IV steroids  Chronic diastolic CHF Continue Lasix Strict I's and O's and daily weight Continue cardiac medications  Coronary artery disease Stable Continue cardiac medications  Hyperlipidemia Continue statin  Essential hypertension Blood pressure  is at goal Continue  antihypertensive medications  Ambulatory dysfunction/physical debility PT consulted to assess Fall precautions At baseline ambulates with a walker    DVT prophylaxis: Subcu Lovenox daily Code Status: full  Family Communication:   None at bedside Disposition Plan:   Home possibly in 2 days when clinically stable Consults called:  none   Objective: Vitals:   02/26/19 2149 02/27/19 0523 02/27/19 0533 02/27/19 0805  BP:   129/76   Pulse:   (!) 104   Resp:   20   Temp:   98.6 F (37 C)   TempSrc:   Oral   SpO2: 97% 97% 95% 92%  Weight:      Height:        Intake/Output Summary (Last 24 hours) at 02/27/2019 1326 Last data filed at 02/27/2019 1300 Gross per 24 hour  Intake 720 ml  Output 3250 ml  Net -2530 ml   Filed Weights   02/24/19 2113 02/24/19 2115  Weight: 86.6 kg 86.6 kg    Exam:  . General: 63 y.o. year-old female well-developed well-nourished no acute distress.  Alert and oriented x3.  On 3 L of oxygen by nasal cannula. . Cardiovascular: Regular rate and rhythm with no rubs or gallops.  No JVD or thyromegaly. Marland Kitchen Respiratory: Diffuse wheezes bilaterally.  Poor inspiratory effort. . Abdomen: Soft nontender nondistended with normal bowel sounds x4 quadrants. . Musculoskeletal: 1+ pitting edema lower extremity. 2/4 pulses in all 4 extremities. Marland Kitchen Psychiatry: Mood is appropriate for condition and setting   Data Reviewed: CBC: Recent Labs  Lab 02/24/19 2235 02/25/19 0548 02/26/19 0653  WBC 19.0* 19.7* 22.1*  NEUTROABS 13.1*  --   --   HGB 12.5 12.1 12.8  HCT 41.6 41.2 43.0  MCV 98.1 97.9 96.6  PLT 303 297 902   Basic Metabolic Panel: Recent Labs  Lab 02/24/19 2235 02/25/19 0548 02/26/19 0653  NA 139 136 137  K 3.2* 4.2 4.0  CL 91* 89* 87*  CO2 38* 34* 37*  GLUCOSE 164* 282* 156*  BUN 11 14 17   CREATININE 0.88 0.91 0.92  CALCIUM 9.3 9.5 9.9  MG  --   --  2.0  PHOS  --   --  3.8   GFR: Estimated Creatinine Clearance: 63.4 mL/min (by C-G formula  based on SCr of 0.92 mg/dL). Liver Function Tests: Recent Labs  Lab 02/24/19 2235  AST 26  ALT 28  ALKPHOS 85  BILITOT 0.3  PROT 6.6  ALBUMIN 3.7   Recent Labs  Lab 02/24/19 2235  LIPASE 45   No results for input(s): AMMONIA in the last 168 hours. Coagulation Profile: No results for input(s): INR, PROTIME in the last 168 hours. Cardiac Enzymes: Recent Labs  Lab 02/24/19 2235  TROPONINI <0.03   BNP (last 3 results) No results for input(s): PROBNP in the last 8760 hours. HbA1C: Recent Labs    02/25/19 0548  HGBA1C 7.8*   CBG: Recent Labs  Lab 02/26/19 1123 02/26/19 1616 02/26/19 2125 02/27/19 0730 02/27/19 1105  GLUCAP 206* 179* 192* 124* 199*   Lipid Profile: No results for input(s): CHOL, HDL, LDLCALC, TRIG, CHOLHDL, LDLDIRECT in the last 72 hours. Thyroid Function Tests: No results for input(s): TSH, T4TOTAL, FREET4, T3FREE, THYROIDAB in the last 72 hours. Anemia Panel: No results for input(s): VITAMINB12, FOLATE, FERRITIN, TIBC, IRON, RETICCTPCT in the last 72 hours. Urine analysis:    Component Value Date/Time   COLORURINE YELLOW 02/25/2019 [place] (A) 02/25/2019 4097  LABSPEC 1.013 02/25/2019 0218   PHURINE 6.0 02/25/2019 0218   GLUCOSEU NEGATIVE 02/25/2019 0218   HGBUR LARGE (A) 02/25/2019 0218   BILIRUBINUR NEGATIVE 02/25/2019 [place] 02/25/2019 0218   PROTEINUR NEGATIVE 02/25/2019 0218   UROBILINOGEN 0.2 04/15/2014 1949   NITRITE NEGATIVE 02/25/2019 0218   LEUKOCYTESUR TRACE (A) 02/25/2019 0218   Sepsis Labs: @LABRCNTIP (procalcitonin:4,lacticidven:4)  )No results found for this or any previous visit (from the past 240 hour(s)).    Studies: No results found.  Scheduled Meds: . aspirin EC  81 mg Oral q morning - 10a  . atorvastatin  80 mg Oral QPM  . calcium-vitamin D  2 tablet Oral BID  . clopidogrel  75 mg Oral Daily  . enoxaparin (LOVENOX) injection  40 mg Subcutaneous Q24H  . fluticasone  2  spray Each Nare Daily  . furosemide  40 mg Intravenous BID  . guaiFENesin  1,200 mg Oral BID  . insulin aspart  0-5 Units Subcutaneous QHS  . insulin aspart  0-9 Units Subcutaneous TID WC  . ipratropium-albuterol  3 mL Nebulization Q6H  . lidocaine  2 patch Transdermal Q24H  . methylPREDNISolone (SOLU-MEDROL) injection  60 mg Intravenous TID  . metoprolol tartrate  12.5 mg Oral BID  . mometasone-formoterol  2 puff Inhalation BID  . nystatin  5 mL Oral QID  . pantoprazole  40 mg Oral BID  . polyethylene glycol  17 g Oral Daily  . senna-docusate  2 tablet Oral BID  . sodium chloride flush  3 mL Intravenous Q12H  . sodium chloride HYPERTONIC  4 mL Nebulization BID  . umeclidinium bromide  1 puff Inhalation Daily    Continuous Infusions: . sodium chloride    . azithromycin 500 mg (02/27/19 0900)     LOS: 2 days     Kayleen Memos, MD [facility] Pager [phone]  If 7PM-7AM, please contact night-coverage [email or web address] Password [email or web address] 02/27/2019, 1:26 PM

## 2019-02-27 NOTE — Progress Notes (Signed)
Pt refused to take her Dulera and Incruse provide by the hospital. Informed nurse about the situation that pt used her own inhalers from home and is requesting that she be able to use her own based on cost and what she is use to taking.

## 2019-02-28 DIAGNOSIS — J9611 Chronic respiratory failure with hypoxia: Secondary | ICD-10-CM

## 2019-02-28 DIAGNOSIS — G8929 Other chronic pain: Secondary | ICD-10-CM

## 2019-02-28 DIAGNOSIS — I5032 Chronic diastolic (congestive) heart failure: Secondary | ICD-10-CM

## 2019-02-28 DIAGNOSIS — I2510 Atherosclerotic heart disease of native coronary artery without angina pectoris: Secondary | ICD-10-CM

## 2019-02-28 DIAGNOSIS — I10 Essential (primary) hypertension: Secondary | ICD-10-CM

## 2019-02-28 LAB — GLUCOSE, CAPILLARY
Glucose-Capillary: 169 mg/dL — ABNORMAL HIGH (ref 70–99)
Glucose-Capillary: 112 mg/dL — ABNORMAL HIGH (ref 70–99)
Glucose-Capillary: 247 mg/dL — ABNORMAL HIGH (ref 70–99)

## 2019-02-28 LAB — MAGNESIUM: Magnesium: 2.4 mg/dL (ref 1.7–2.4)

## 2019-02-28 MED ORDER — DOXYCYCLINE HYCLATE 100 MG PO CAPS: 100.0000 mg | ORAL_CAPSULE | Freq: Two times a day (BID) | ORAL | 0 refills | Status: AC

## 2019-02-28 MED ORDER — CALCIUM CARBONATE-VITAMIN D 500-200 MG-UNIT PO TABS: 2.0000 | ORAL_TABLET | Freq: Two times a day (BID) | ORAL | Status: DC

## 2019-02-28 MED ORDER — INSULIN ASPART 100 UNIT/ML ~~LOC~~ SOLN: 0.0000 [IU] | Freq: Every day | SUBCUTANEOUS | 11 refills | Status: DC

## 2019-02-28 MED ORDER — PREDNISONE 20 MG PO TABS: ORAL_TABLET | ORAL | 0 refills | Status: DC

## 2019-02-28 MED ORDER — FUROSEMIDE 40 MG PO TABS: 40.0000 mg | ORAL_TABLET | ORAL | Status: DC

## 2019-02-28 MED ORDER — SODIUM CHLORIDE 0.9 % IV SOLN
INTRAVENOUS | Status: DC | PRN
  Administered 2019-02-28: 250 mL via INTRAVENOUS

## 2019-02-28 MED ORDER — GUAIFENESIN ER 600 MG PO TB12: 1200.0000 mg | ORAL_TABLET | Freq: Two times a day (BID) | ORAL | 0 refills | Status: AC

## 2019-02-28 NOTE — Discharge Instructions (Signed)
Please follow up with primary care provider to have a gallbladder Ultrasound done in 1 month.   Please follow up with you lung care specialist as soon as possible in next week for recheck.  Please stop smoking and using all tobacco products.     IMPORTANT INFORMATION: PAY CLOSE ATTENTION   PHYSICIAN DISCHARGE INSTRUCTIONS  Follow with Primary care provider  [facility], Modena Nunnery, MD  and other consultants as instructed your Hospitalist Physician  SEEK MEDICAL CARE OR RETURN TO EMERGENCY ROOM IF SYMPTOMS COME BACK, WORSEN OR NEW PROBLEM DEVELOPS.   Please note: You were cared for by a hospitalist during your hospital stay. Every effort will be made to forward records to your primary care provider.  You can request that your primary care provider send for your hospital records if they have not received them.  Once you are discharged, your primary care physician will handle any further medical issues. Please note that NO REFILLS for any discharge medications will be authorized once you are discharged, as it is imperative that you return to your primary care physician (or establish a relationship with a primary care physician if you do not have one) for your post hospital discharge needs so that they can reassess your need for medications and monitor your lab values.  Please get a complete blood count and chemistry panel checked by your Primary MD at your next visit, and again as instructed by your Primary MD.  Get Medicines reviewed and adjusted: Please take all your medications with you for your next visit with your Primary MD  Laboratory/radiological data: Please request your Primary MD to go over all hospital tests and procedure/radiological results at the follow up, please ask your primary care provider to get all Hospital records sent to his/her office.  In some cases, they will be blood work, cultures and biopsy results pending at the time of your discharge. Please request that your primary  care provider follow up on these results.  If you are diabetic, please bring your blood sugar readings with you to your follow up appointment with primary care.    Please call and make your follow up appointments as soon as possible.    Also Note the following: If you experience worsening of your admission symptoms, develop shortness of breath, life threatening emergency, suicidal or homicidal thoughts you must seek medical attention immediately by calling 911 or calling your MD immediately  if symptoms less severe.  You must read complete instructions/literature along with all the possible adverse reactions/side effects for all the Medicines you take and that have been prescribed to you. Take any new Medicines after you have completely understood and accpet all the possible adverse reactions/side effects.   Do not drive when taking Pain medications or sleeping medications (Benzodiazepines)  Do not take more than prescribed Pain, Sleep and Anxiety Medications. It is not advisable to combine anxiety,sleep and pain medications without talking with your primary care practitioner  Special Instructions: If you have smoked or chewed Tobacco  in the last 2 yrs please stop smoking, stop any regular Alcohol  and or any Recreational drug use.  Wear Seat belts while driving.

## 2019-02-28 NOTE — Evaluation (Addendum)
Physical Therapy Evaluation Patient Details Name: Cindy [name] MRN: [id number] DOB: 1956-06-17 Today's Date: 02/28/2019   History of Present Illness  Patient is a 63 year old female admitted 02/25/2019 with diagnosis of COPD exacerbation. PMH: HTN, COPD, chronic hypoxemic respiratory failure, CAD, chronic pain, T11, L1, L3 compression fracture history Type 2 DM. At baseline ambulates with walker.    Clinical Impression   Pt admitted with above diagnosis. Pt currently with functional limitations due to the deficits listed below (see PT Problem List).  Patient evaluated by Physical Therapy with no further acute PT needs identified. All education has been completed and the patient has no further questions. Patient resistive to working with therapist today. Patient did agree to participation to ambulated around bed so PT could complete eval. 3LPM O2 throughout session. When given a choice, patient chose to ambulate with RW around bed and then chose to ambulate back around bed for total distance of 28 feet. Patient exhibited impulsive movements and behavior. Patient also exhibited SOB/DOE following activity. PT discussed HHPT recommendation with patient. Patient reported she had HHPT for 4-6 after hospital stay in January 2020 and was not interested in additional HHPT at this time.  See below for any follow-up Physical Therapy or equipment needs. PT is signing off. Thank you for this referral.      Follow Up Recommendations No PT follow up;Home health PT(patient refusing HHPT services at time of eval.)    Equipment Recommendations  None recommended by PT    Recommendations for Other Services       Precautions / Restrictions Precautions Precautions: Fall Restrictions Weight Bearing Restrictions: No      Mobility  Bed Mobility Overal bed mobility: Independent                Transfers Overall transfer level: Independent               General transfer comment: patient is  somewhat impulsive in her movements  Ambulation/Gait Ambulation/Gait assistance: Supervision Gait Distance (Feet): 28 Feet Assistive device: Rolling walker (2 wheeled) Gait Pattern/deviations: Step-through pattern     General Gait Details: impulsive, quick movements, refusal to ambulate initially, Agreed to walk around bed and then stated she would walk back around bed; used RW; no loss of balance  Stairs            Wheelchair Mobility    Modified Rankin (Stroke Patients Only)       Balance Overall balance assessment: Mild deficits observed, not formally tested                                           Pertinent Vitals/Pain Pain Assessment: 0-10 Pain Score: 9  Pain Descriptors / Indicators: Shooting;Sharp;Aching Pain Intervention(s): Limited activity within patient's tolerance;Monitored during session    [place] expects to be discharged to:: Private residence Living Arrangements: Non-relatives/Friends Available Help at Discharge: Friend(s);Available 24 hours/day Type of Home: Mobile home Home Access: Stairs to enter Entrance Stairs-Rails: Right;Left;Can reach both Entrance Stairs-Number of Steps: 4 Home Layout: One level Home Equipment: Walker - 2 wheels;Wheelchair - Insurance claims handler - 4 wheels Additional Comments: patietn reports she is unable to get into the tub to bathe any longer and takes a sponge bath with assistance.    Prior Function Level of Independence: Independent with assistive device(s)         Comments: 3L  home O2, RW for household ambulation.      Hand Dominance   Dominant Hand: Right    Extremity/Trunk Assessment   Upper Extremity Assessment Upper Extremity Assessment: Overall WFL for tasks assessed    Lower Extremity Assessment Lower Extremity Assessment: Overall WFL for tasks assessed    Cervical / Trunk Assessment Cervical / Trunk Assessment: Normal  Communication    Communication: No difficulties  Cognition Arousal/Alertness: Awake/alert Behavior During Therapy: WFL for tasks assessed/performed Overall Cognitive Status: Within Functional Limits for tasks assessed                                        General Comments      Exercises     Assessment/Plan    PT Assessment All further PT needs can be met in the next venue of care  PT Problem List Decreased activity tolerance;Decreased knowledge of use of DME       PT Treatment Interventions      PT Goals (Current goals can be found in the Care Plan section)  Acute Rehab PT Goals Patient Stated Goal: go home without HHPT PT Goal Formulation: With patient Time For Goal Achievement: 03/14/19 Potential to Achieve Goals: Good    Frequency     Barriers to discharge        Co-evaluation               AM-PAC PT "6 Clicks" Mobility  Outcome Measure Help needed turning from your back to your side while in a flat bed without using bedrails?: None Help needed moving from lying on your back to sitting on the side of a flat bed without using bedrails?: A Little Help needed moving to and from a bed to a chair (including a wheelchair)?: None Help needed standing up from a chair using your arms (e.g., wheelchair or bedside chair)?: None Help needed to walk in hospital room?: A Little Help needed climbing 3-5 steps with a railing? : A Little 6 Click Score: 21    End of Session   Activity Tolerance: Patient limited by fatigue;Other (comment)(limited by SOB/Dyspnea on exertion) Patient left: in bed;with call bell/phone within reach Nurse Communication: Mobility status PT Visit Diagnosis: Unsteadiness on feet (R26.81)    Time: 1100-1130 PT Time Calculation (min) (ACUTE ONLY): 30 min   Charges:   PT Evaluation $PT Eval Moderate Complexity: 1 Mod PT Treatments $Therapeutic Activity: 8-22 mins        Floria Raveling. Hartnett-Rands, MS, PT Per [place] [phone] 02/28/2019, 11:48 AM

## 2019-02-28 NOTE — Discharge Summary (Signed)
Physician Discharge Summary  Cindy [name] VXY:[id number] DOB: 02-04-1956 DOA: 02/24/2019  PCP: Alycia Rossetti, MD  Pulmonologist: Dr. Baltazar Apo  Admit date: 02/24/2019 Discharge date: 02/28/2019  Admitted From: Home  Disposition: Home   Recommendations for Outpatient Follow-up:  1. Follow up with PCP in 1 weeks 2. Please follow up with pulmonologist in 1 week 3. Please arrange for gallbladder ultrasound in 1 month to follow-up CT scan findings to exclude gallbladder mass.    Discharge Condition: Stable CODE STATUS: Full  Brief Hospitalization Summary: Please see all hospital notes, images, labs for full details of the hospitalization. Dr. Camelia Eng HPI: Cindy [name] is a 63 y.o. female with medical history significant of adv copd on 3 liters cont oxygen at home, tobacco abuse, chronic diastolic chf, chronic pain, comes in with many complaints difficult to distinguish what the acute issue is.  Mainly complaining of sob and wheezing at home with more of a cough.   Patient has a history of chronic back pain which is been a little worse than usual.  She is been seen chronic pain clinic and has been refusing to get injections and her primary care physician has been managing her chronic Percocets.  This was recently increased.  Patient comes in because of shortness of breath and wheezing she received several nebs in the emergency department which is helped some but she still wheezing a lot she is been given IV Solu-Medrol.  She denies any chest pain or lower extremity edema.  Patient be referred for admission for COPD exacerbation.  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  She is followed by an outpatient pulmonologist Dr. Lamonte Sakai.  She presented with acute COPD exacerbation.  She was treated with IV steroids, IV antibiotics.  Smoking cessation counseling was done multiple times at the bedside.  She improved to the point where she felt  that she was stable to go home and began asking to go home.  She feels like she can manage at home and is at her baseline.  She seems to have responded well to the treatments given in the hospital.  She will be discharged home on a prednisone taper and oral doxycycline and Mucinex and will resume her home respiratory medications and I have recommended close follow-up with primary care provider and pulmonologist.  The patient verbalized understanding.  In addition, I have requested that she speak with her primary care physician regarding obtaining a gallbladder ultrasound in the next month to follow-up CT scan results.  The patient verbalized understanding.  I have strongly advised smoking cessation as well and patient verbalized understanding.  Her sinus tachycardia has resolved.  She will continue oral treatments for oral thrush with nystatin.  She should follow-up with her primary care to have this rechecked in the next week.  She has chronic thoracic and lumbar compression fractures which have remained stable follow-up with primary care provider.  The patient was seen by physical therapy and they did recommend home health physical therapy but patient has refused this service.  Patient was given precautions to seek medical care or return to emergency department if symptoms come back, worsen or new problems develop.  The patient verbalized understanding.  Discharge Diagnoses:  Principal Problem:   COPD exacerbation ([place]) Active Problems:   Chronic hypoxemic respiratory failure (HCC)   Essential hypertension   Chronic diastolic CHF (congestive heart failure) (HCC)   CAD (coronary artery disease)   Chronic  pain   Discharge Instructions: Discharge Instructions    Call MD for:  difficulty breathing, headache or visual disturbances   Complete by:  As directed    Call MD for:  extreme fatigue   Complete by:  As directed    Call MD for:  persistant dizziness or light-headedness   Complete by:  As  directed    Call MD for:  persistant nausea and vomiting   Complete by:  As directed    Call MD for:  redness, tenderness, or signs of infection (pain, swelling, redness, odor or green/yellow discharge around incision site)   Complete by:  As directed    Increase activity slowly   Complete by:  As directed      Allergies as of 02/28/2019      Reactions   Keflex [cephalexin] Anaphylaxis, Rash   Sulfa Antibiotics    Avelox [moxifloxacin Hcl In Nacl] Itching, Rash   Toradol [ketorolac Tromethamine] Swelling, Rash, Other (See Comments)   Bruising, swelling, rash at injection site      Medication List    STOP taking these medications   loratadine 10 MG tablet Commonly known as:  CLARITIN     TAKE these medications   albuterol (2.5 MG/3ML) 0.083% nebulizer solution Commonly known as:  PROVENTIL 1 nebulizer every 4 hours as needed What changed:    how much to take  how to take this  when to take this  reasons to take this  additional instructions   albuterol 108 (90 Base) MCG/ACT inhaler Commonly known as:  PROVENTIL HFA;VENTOLIN HFA Inhale 2 puffs into the lungs every 6 (six) hours as needed for wheezing or shortness of breath. What changed:  Another medication with the same name was changed. Make sure you understand how and when to take each.   alendronate 70 MG tablet Commonly known as:  FOSAMAX TAKE 1 TAB BY MOUTH EVERY WEEK ON AN EMPTY STOMACH. What changed:  See the new instructions.   aspirin EC 81 MG tablet Take 81 mg by mouth every morning.   atorvastatin 80 MG tablet Commonly known as:  LIPITOR Take 1 tablet (80 mg total) by mouth daily. What changed:  when to take this   Calcium Carb-Cholecalciferol 600-800 MG-UNIT Tabs Take 1 tablet by mouth 2 (two) times daily.   clopidogrel 75 MG tablet Commonly known as:  PLAVIX Take 1 tablet (75 mg total) by mouth daily.   diphenhydrAMINE 12.5 MG/5ML liquid Commonly known as:  BENADRYL Take 6.25 mg by mouth  4 (four) times daily as needed.   doxycycline 100 MG capsule Commonly known as:  VIBRAMYCIN Take 1 capsule (100 mg total) by mouth 2 (two) times daily for 7 days.   fluticasone 50 MCG/ACT nasal spray Commonly known as:  FLONASE INSTILL 1 SPRAY INTO EACH NOSTRIL DAILY AS NEEDED FOR ALLERGIES.   furosemide 40 MG tablet Commonly known as:  LASIX Take 1-2 tablets (40-80 mg total) by mouth See admin instructions. Alternate taking 80mg  one day, then take 40mg  the next day, then repeat.   guaiFENesin 600 MG 12 hr tablet Commonly known as:  MUCINEX Take 2 tablets (1,200 mg total) by mouth 2 (two) times daily for 7 days.   Magnesium 250 MG Tabs Take 1 tablet daily What changed:    how much to take  how to take this  when to take this  additional instructions   metoprolol tartrate 25 MG tablet Commonly known as:  LOPRESSOR Take 0.5 tablets (12.5 mg total)  by mouth 2 (two) times daily.   mupirocin nasal ointment 2 % Commonly known as:  BACTROBAN NASAL Apply to nares twice a day for 5 days   nitroGLYCERIN 0.4 MG SL tablet Commonly known as:  NITROSTAT Place 1 tablet (0.4 mg total) under the tongue every 5 (five) minutes as needed for chest pain. If no improvement with 3 doses call 911   nystatin 100000 UNIT/ML suspension Commonly known as:  MYCOSTATIN TAKE 5 ML BY MOUTH FOUR TIMES A DAY AS DIRECTED. What changed:  See the new instructions.   oxyCODONE-acetaminophen 7.5-325 MG tablet Commonly known as:  PERCOCET Take 1 tablet by mouth every 4 (four) hours as needed for severe pain. What changed:  how much to take   OXYGEN Inhale 3 L into the lungs continuous.   pantoprazole 40 MG tablet Commonly known as:  PROTONIX TAKE ONE TABLET BY MOUTH TWICE A DAY   polyethylene glycol packet Commonly known as:  MIRALAX / GLYCOLAX Take 17 g by mouth daily. What changed:    when to take this  reasons to take this   potassium chloride SA 20 MEQ tablet Commonly known as:   K-DUR,KLOR-CON TAKE 1 TABLET BY MOUTH ONCE A DAY.   predniSONE 20 MG tablet Commonly known as:  DELTASONE Take 3 tabs daily with breakfast for 5 days, then 2 tabs daily x 5 days, then 1 tab daily x 5 days, then 0.5 mg daily What changed:    medication strength  how much to take  how to take this  when to take this  additional instructions   SPIRIVA RESPIMAT 2.5 MCG/ACT Aers Generic drug:  Tiotropium Bromide Monohydrate INHALE 2 PUFFS INTO THE LUNGS ONCE DAILY. What changed:  See the new instructions.   SYMBICORT 160-4.5 MCG/ACT inhaler Generic drug:  budesonide-formoterol INHALE 2 PUFFS INTO THE LUNGS TWICE DAILY.      Follow-up Information    Aviva Signs, MD. Schedule an appointment as soon as possible for a visit in 1 month(s).   Specialty:  General Surgery Why:  check gallbladder Contact information: [address] [place] [address] [phone]        Alycia Rossetti, MD. Schedule an appointment as soon as possible for a visit in 1 week(s).   Specialty:  Family Medicine Why:  Hospital Follow Up  Contact information: [address] [place] [address] [phone]        Herminio Commons, MD .   Specialty:  Cardiology Contact information: [place] [address] [phone]          Allergies  Allergen Reactions  . Keflex [Cephalexin] Anaphylaxis and Rash  . Sulfa Antibiotics   . Avelox [Moxifloxacin Hcl In Nacl] Itching and Rash  . Toradol [Ketorolac Tromethamine] Swelling, Rash and Other (See Comments)    Bruising, swelling, rash at injection site   Allergies as of 02/28/2019      Reactions   Keflex [cephalexin] Anaphylaxis, Rash   Sulfa Antibiotics    Avelox [moxifloxacin Hcl In Nacl] Itching, Rash   Toradol [ketorolac Tromethamine] Swelling, Rash, Other (See Comments)   Bruising, swelling, rash at injection site      Medication List    STOP taking these medications   loratadine 10 MG  tablet Commonly known as:  CLARITIN     TAKE these medications   albuterol (2.5 MG/3ML) 0.083% nebulizer solution Commonly known as:  PROVENTIL 1 nebulizer every 4 hours as needed What changed:  how much to take  how to take this  when to take this  reasons to take this  additional instructions   albuterol 108 (90 Base) MCG/ACT inhaler Commonly known as:  PROVENTIL HFA;VENTOLIN HFA Inhale 2 puffs into the lungs every 6 (six) hours as needed for wheezing or shortness of breath. What changed:  Another medication with the same name was changed. Make sure you understand how and when to take each.   alendronate 70 MG tablet Commonly known as:  FOSAMAX TAKE 1 TAB BY MOUTH EVERY WEEK ON AN EMPTY STOMACH. What changed:  See the new instructions.   aspirin EC 81 MG tablet Take 81 mg by mouth every morning.   atorvastatin 80 MG tablet Commonly known as:  LIPITOR Take 1 tablet (80 mg total) by mouth daily. What changed:  when to take this   Calcium Carb-Cholecalciferol 600-800 MG-UNIT Tabs Take 1 tablet by mouth 2 (two) times daily.   clopidogrel 75 MG tablet Commonly known as:  PLAVIX Take 1 tablet (75 mg total) by mouth daily.   diphenhydrAMINE 12.5 MG/5ML liquid Commonly known as:  BENADRYL Take 6.25 mg by mouth 4 (four) times daily as needed.   doxycycline 100 MG capsule Commonly known as:  VIBRAMYCIN Take 1 capsule (100 mg total) by mouth 2 (two) times daily for 7 days.   fluticasone 50 MCG/ACT nasal spray Commonly known as:  FLONASE INSTILL 1 SPRAY INTO EACH NOSTRIL DAILY AS NEEDED FOR ALLERGIES.   furosemide 40 MG tablet Commonly known as:  LASIX Take 1-2 tablets (40-80 mg total) by mouth See admin instructions. Alternate taking 80mg  one day, then take 40mg  the next day, then repeat.   guaiFENesin 600 MG 12 hr tablet Commonly known as:  MUCINEX Take 2 tablets (1,200 mg total) by mouth 2 (two) times daily for 7 days.   Magnesium 250 MG Tabs Take 1 tablet  daily What changed:    how much to take  how to take this  when to take this  additional instructions   metoprolol tartrate 25 MG tablet Commonly known as:  LOPRESSOR Take 0.5 tablets (12.5 mg total) by mouth 2 (two) times daily.   mupirocin nasal ointment 2 % Commonly known as:  BACTROBAN NASAL Apply to nares twice a day for 5 days   nitroGLYCERIN 0.4 MG SL tablet Commonly known as:  NITROSTAT Place 1 tablet (0.4 mg total) under the tongue every 5 (five) minutes as needed for chest pain. If no improvement with 3 doses call 911   nystatin 100000 UNIT/ML suspension Commonly known as:  MYCOSTATIN TAKE 5 ML BY MOUTH FOUR TIMES A DAY AS DIRECTED. What changed:  See the new instructions.   oxyCODONE-acetaminophen 7.5-325 MG tablet Commonly known as:  PERCOCET Take 1 tablet by mouth every 4 (four) hours as needed for severe pain. What changed:  how much to take   OXYGEN Inhale 3 L into the lungs continuous.   pantoprazole 40 MG tablet Commonly known as:  PROTONIX TAKE ONE TABLET BY MOUTH TWICE A DAY   polyethylene glycol packet Commonly known as:  MIRALAX / GLYCOLAX Take 17 g by mouth daily. What changed:    when to take this  reasons to take this   potassium chloride SA 20 MEQ tablet Commonly known as:  K-DUR,KLOR-CON TAKE 1 TABLET BY MOUTH ONCE A DAY.   predniSONE 20 MG tablet Commonly known as:  DELTASONE Take 3 tabs daily with breakfast for 5 days, then 2 tabs daily  x 5 days, then 1 tab daily x 5 days, then 0.5 mg daily What changed:    medication strength  how much to take  how to take this  when to take this  additional instructions   SPIRIVA RESPIMAT 2.5 MCG/ACT Aers Generic drug:  Tiotropium Bromide Monohydrate INHALE 2 PUFFS INTO THE LUNGS ONCE DAILY. What changed:  See the new instructions.   SYMBICORT 160-4.5 MCG/ACT inhaler Generic drug:  budesonide-formoterol INHALE 2 PUFFS INTO THE LUNGS TWICE DAILY.       Procedures/Studies: Dg  Chest 2 View  Result Date: 02/25/2019 CLINICAL DATA:  Increasing shortness of breath. Former smoker. EXAM: CHEST - 2 VIEW COMPARISON:  01/22/2019 FINDINGS: Emphysematous changes in the lungs. Central peribronchial thickening and interstitial changes likely representing chronic bronchitis. No consolidation or airspace disease. No blunting of costophrenic angles. No pneumothorax. Normal heart size and pulmonary vascularity. Coronary artery stent. Calcification of the aorta. Compression of a lower thoracic vertebra, unchanged. IMPRESSION: Emphysematous and chronic bronchitic changes in the lungs. No evidence of active pulmonary disease. Electronically Signed   By: Lucienne Capers M.D.   On: 02/25/2019 02:53   Ct Abdomen Pelvis W Contrast  Result Date: 02/25/2019 CLINICAL DATA:  63 year old female with abdominal pain. EXAM: CT ABDOMEN AND PELVIS WITH CONTRAST TECHNIQUE: Multidetector CT imaging of the abdomen and pelvis was performed using the standard protocol following bolus administration of intravenous contrast. CONTRAST:  158mL OMNIPAQUE IOHEXOL 300 MG/ML  SOLN COMPARISON:  CT of the abdomen pelvis dated 05/25/2018 FINDINGS: Lower chest: The visualized lung bases are clear. There is coronary vascular calcification. No intra-abdominal free air or free fluid. Hepatobiliary: The liver is unremarkable. No intrahepatic biliary ductal dilatation. There is a lobulated and septated appearance of the gallbladder fundus which may represent adenomyomatosis. This finding is somewhat similar to the prior CT. A gallbladder mass is not excluded. Further evaluation with ultrasound on a nonemergent basis recommended. No calcified stone. A 7 mm nodular density in the gallbladder fundus may represent an adherent noncalcified stone or a polyp. This is similar to prior CT. Pancreas: Unremarkable. No pancreatic ductal dilatation or surrounding inflammatory changes. Spleen: Normal in size without focal abnormality.  Adrenals/Urinary Tract: Left adrenal thickening/hyperplasia or adenoma. This is similar to prior CT. The right adrenal gland is unremarkable. There are multiple nonobstructing bilateral renal calculi measure up to 8 mm in the upper pole of the right kidney. There is no hydronephrosis on either side. The visualized ureters and urinary bladder appear unremarkable. There is symmetric enhancement and excretion of contrast by both kidneys. Stomach/Bowel: There is moderate stool throughout the colon. There is no bowel obstruction or active inflammation. There is sigmoid diverticulosis without active inflammatory changes. There is a 4 cm duodenal diverticulum. Normal appendix. Vascular/Lymphatic: Moderate aortoiliac atherosclerotic disease. The aorta is tortuous. No portal venous gas. There is no adenopathy. Reproductive: The uterus and ovaries are grossly unremarkable. No pelvic mass. Other: None Musculoskeletal: Osteopenia with degenerative changes of the spine. There is compression fracture of the L3 inferior endplate with approximately 50% loss of vertebral body height, similar to prior study. Age indeterminate compression fracture of L1 inferior endplate, new since the prior CT. There is compression fracture of T11 with near complete loss of vertebral body height centrally and anterior wedging, age indeterminate. IMPRESSION: 1. No bowel obstruction or active inflammation. Normal appendix. 2. Sigmoid diverticulosis. 3. Bilateral nonobstructing renal calculi. No hydronephrosis. 4. 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 Further evaluation with  ultrasound on a nonemergent basis recommended to exclude a gallbladder mass. 5. Age indeterminate compression fractures of L1 and T11. Clinical correlation is recommended. Electronically Signed   By: Anner Crete M.D.   On: 02/25/2019 02:55      Subjective: Patient says she feels at her baseline.  She says that she feels that she can manage  at home she would like to go home today.  She denies chest pain.  Discharge Exam: Vitals:   02/28/19 0501 02/28/19 0742  BP: 132/85   Pulse: 88   Resp: 20   Temp: 97.7 F (36.5 C)   SpO2: 97% 96%   Vitals:   02/28/19 0313 02/28/19 0400 02/28/19 0501 02/28/19 0742  BP:   132/85   Pulse:  (!) 104 88   Resp:   20   Temp:   97.7 F (36.5 C)   TempSrc:   Oral   SpO2: 95%  97% 96%  Weight:   88.9 kg   Height:        General: nasal cannula with oxygen in place at 2L/m. Pt is alert, awake, not in acute distress, patient appears comfortable in speaking in full sentences Cardiovascular: Normal S1/S2 +, no rubs, no gallops Respiratory: Bilateral breath sounds with diffuse expiratory wheezes, no increased work of breathing, no rhonchi Abdominal: Soft, NT, ND, bowel sounds + Extremities: Trace bilateral pretibial edema, no cyanosis   The results of significant diagnostics from this hospitalization (including imaging, microbiology, ancillary and laboratory) are listed below for reference.     Microbiology: No results found for this or any previous visit (from the past 240 hour(s)).   Labs: BNP (last 3 results) Recent Labs    08/10/18 1735 01/22/19 1555 02/24/19 2235  BNP 32.0 21.0 35.5   Basic Metabolic Panel: Recent Labs  Lab 02/24/19 2235 02/25/19 0548 02/26/19 0653 02/28/19 0909  NA 139 136 137  --   K 3.2* 4.2 4.0  --   CL 91* 89* 87*  --   CO2 38* 34* 37*  --   GLUCOSE 164* 282* 156*  --   BUN 11 14 17   --   CREATININE 0.88 0.91 0.92  --   CALCIUM 9.3 9.5 9.9  --   MG  --   --  2.0 2.4  PHOS  --   --  3.8  --    Liver Function Tests: Recent Labs  Lab 02/24/19 2235  AST 26  ALT 28  ALKPHOS 85  BILITOT 0.3  PROT 6.6  ALBUMIN 3.7   Recent Labs  Lab 02/24/19 2235  LIPASE 45   No results for input(s): AMMONIA in the last 168 hours. CBC: Recent Labs  Lab 02/24/19 2235 02/25/19 0548 02/26/19 0653 02/27/19 1414  WBC 19.0* 19.7* 22.1* 17.7*   NEUTROABS 13.1*  --   --  15.3*  HGB 12.5 12.1 12.8 12.0  HCT 41.6 41.2 43.0 39.3  MCV 98.1 97.9 96.6 95.2  PLT 303 297 338 307   Cardiac Enzymes: Recent Labs  Lab 02/24/19 2235  TROPONINI <0.03   BNP: Invalid input(s): POCBNP CBG: Recent Labs  Lab 02/27/19 1105 02/27/19 1605 02/27/19 2147 02/28/19 0743 02/28/19 1127  GLUCAP 199* 189* 169* 112* 247*   D-Dimer No results for input(s): DDIMER in the last 72 hours. Hgb A1c No results for input(s): HGBA1C in the last 72 hours. Lipid Profile No results for input(s): CHOL, HDL, LDLCALC, TRIG, CHOLHDL, LDLDIRECT in the last 72 hours. Thyroid function studies No results  for input(s): TSH, T4TOTAL, T3FREE, THYROIDAB in the last 72 hours.  Invalid input(s): FREET3 Anemia work up No results for input(s): VITAMINB12, FOLATE, FERRITIN, TIBC, IRON, RETICCTPCT in the last 72 hours. Urinalysis    Component Value Date/Time   COLORURINE YELLOW 02/25/2019 0218   APPEARANCEUR HAZY (A) 02/25/2019 0218   LABSPEC 1.013 02/25/2019 0218   PHURINE 6.0 02/25/2019 0218   GLUCOSEU NEGATIVE 02/25/2019 0218   HGBUR LARGE (A) 02/25/2019 0218   BILIRUBINUR NEGATIVE 02/25/2019 [place] 02/25/2019 0218   PROTEINUR NEGATIVE 02/25/2019 0218   UROBILINOGEN 0.2 04/15/2014 1949   NITRITE NEGATIVE 02/25/2019 0218   LEUKOCYTESUR TRACE (A) 02/25/2019 0218   Sepsis Labs Invalid input(s): PROCALCITONIN,  WBC,  LACTICIDVEN Microbiology No results found for this or any previous visit (from the past 240 hour(s)).  Time coordinating discharge: 33 minutes   SIGNED:  Irwin Brakeman, MD  [facility] 02/28/2019, 2:40 PM How to contact the [facility] Attending or Consulting provider [place] or covering provider during after hours [place], for this patient?  1. Check the care team in [facility] and look for a) attending/consulting [facility] provider listed and b) the [facility] team listed 2. Log into [email or web address] and use [place]'s universal password  to access. If you do not have the password, please contact the hospital operator. 3. Locate the [facility] provider you are looking for under [facility] and page to a number that you can be directly reached. 4. If you still have difficulty reaching the provider, please page the [facility] (Director on Call) for the Hospitalists listed on amion for assistance.

## 2019-02-28 NOTE — Care Management Important Message (Signed)
Important Message  Patient Details  Name: Cindy [name] MRN: [id number] Date of Birth: 1956/11/05   Medicare Important Message Given:  Yes    Tommy Medal 02/28/2019, 12:08 PM

## 2019-02-28 NOTE — Care Management (Signed)
Admitted with COPD. Pt from home, lives with spouse. Has insurance and PCP. Pt has home O2. Per [facility] note pt with [facility]. PT recommends HH PT. Pt "does not want to refuse" but "does not feel she needs" home health. Pt feels her lungs are too bad for activity. Pt upset she is being asked about HH. No CM needs communicated at this time.

## 2019-02-28 NOTE — Progress Notes (Signed)
Patient's IV catheter removed and intact. Pt's IV site clean dry and intact. Discharge instructions were reviewed and discussed with patient. All questions were answered and no further questions at this time. Pt escorted by nurse tech

## 2019-03-02 ENCOUNTER — Ambulatory Visit (INDEPENDENT_AMBULATORY_CARE_PROVIDER_SITE_OTHER): Payer: Medicare Other | Admitting: Family Medicine

## 2019-03-02 ENCOUNTER — Encounter: Payer: Self-pay | Admitting: Family Medicine

## 2019-03-02 ENCOUNTER — Other Ambulatory Visit: Payer: Self-pay

## 2019-03-02 VITALS — BP 142/86 | HR 82 | Temp 98.4°F | Resp 24 | Ht 61.0 in | Wt 195.0 lb

## 2019-03-02 DIAGNOSIS — G8929 Other chronic pain: Secondary | ICD-10-CM

## 2019-03-02 DIAGNOSIS — I10 Essential (primary) hypertension: Secondary | ICD-10-CM

## 2019-03-02 DIAGNOSIS — M7989 Other specified soft tissue disorders: Secondary | ICD-10-CM

## 2019-03-02 DIAGNOSIS — J9611 Chronic respiratory failure with hypoxia: Secondary | ICD-10-CM

## 2019-03-02 DIAGNOSIS — J441 Chronic obstructive pulmonary disease with (acute) exacerbation: Secondary | ICD-10-CM

## 2019-03-02 DIAGNOSIS — K828 Other specified diseases of gallbladder: Secondary | ICD-10-CM

## 2019-03-02 DIAGNOSIS — E119 Type 2 diabetes mellitus without complications: Secondary | ICD-10-CM | POA: Diagnosis not present

## 2019-03-02 DIAGNOSIS — I2510 Atherosclerotic heart disease of native coronary artery without angina pectoris: Secondary | ICD-10-CM | POA: Diagnosis not present

## 2019-03-02 DIAGNOSIS — I5032 Chronic diastolic (congestive) heart failure: Secondary | ICD-10-CM | POA: Diagnosis not present

## 2019-03-02 MED ORDER — SITAGLIPTIN PHOSPHATE 100 MG PO TABS: 100.0000 mg | ORAL_TABLET | Freq: Every day | ORAL | 3 refills | Status: DC

## 2019-03-02 MED ORDER — BLOOD GLUCOSE TEST VI STRP: ORAL_STRIP | 11 refills | Status: DC

## 2019-03-02 MED ORDER — LANCETS MISC: 11 refills | Status: DC

## 2019-03-02 MED ORDER — LINACLOTIDE 145 MCG PO CAPS: 145.0000 ug | ORAL_CAPSULE | Freq: Every day | ORAL | 3 refills | Status: DC

## 2019-03-02 MED ORDER — BLOOD GLUCOSE SYSTEM PAK KIT: PACK | 0 refills | Status: DC

## 2019-03-02 NOTE — Assessment & Plan Note (Addendum)
Currently with treatment for yet another COPD exacerbation.  She does not want see the pulmonologist until next month that she cannot handle multiple appointments and leaving the house that much.  She is going to complete her prednisone taper as well as her antibiotic she is using Mucinex and her nebs as prescribed.  She is on 3 L of oxygen  Palliative care is working with patient she has end-stage COPD

## 2019-03-02 NOTE — Assessment & Plan Note (Addendum)
I cannot find any detailed information with regards to the diabetes and the insulin use.  Not sure why she was given a short acting insulin to use at bedtime.  I will have her stop this.  Her A1c is 7.8%.  As she does have heart failure even though her renal function is preserved I would not start with metformin she also already has difficulty with her bowels.  I have given her samples of Januvia 100 mg to try.  Also showed her how to use a meter here in the office. CHECK CBG TWICE A DAY . She is going to call with blood sugar readings in 2 weeks.

## 2019-03-02 NOTE — Patient Instructions (Addendum)
We will call with lab results Ultrasound to be done in April for Gallbladder Take Januvia 100mg  once a day for blood sugar  Call us in 2 weeks with blood sugar readings Stop the insulin  Take linzess for constipation F/U 3 months     Appointments:  Hillery Hunter, MD~ [facility] 03/11/2019 @ 10:45am  Baltazar Apo, MD~ [facility] 04/25/2019 @ 11:30am

## 2019-03-02 NOTE — Assessment & Plan Note (Signed)
Continue Lasix.  She often has more swelling in the right leg than the left

## 2019-03-02 NOTE — Assessment & Plan Note (Signed)
Has not taken her Lasix for today.  Did not have CHF exacerbation.  Her blood pressure is mildly elevated but this is very taxing for her to get around the office.  I am not going to change her medication today

## 2019-03-02 NOTE — Assessment & Plan Note (Addendum)
Pain with multiple compression fractures.  We have scheduled her follow-up with her spine specialist to have kyphoplasty done. Continue Percocet.

## 2019-03-02 NOTE — Progress Notes (Addendum)
Subjective:    Patient ID: [name], female    DOB: 1956/09/23, 63 y.o.   MRN: [id number]  Patient presents for Hospital F/U (COPD Exacerbation, RLE edema)   Pt here for hospital follow up    Admitted due to COPD exacernation, treated with typical inpatient regumenin, now on prednisone taper currently on 60mg  , taking doxycyline and mucinex Oxygen on 3L discontinued cough and congestion but breathing is a little better.  She wants to hold off and see her pulmonologist next month everything is too taxing for her.  Palliative care is coming out of the home this afternoon.  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 But no meter no instruction or use, it was also not on her discharge paperwork      Review Of Systems:  GEN- denies fatigue, fever, weight loss,weakness, recent illness HEENT- denies eye drainage, change in vision, nasal discharge, CVS- denies chest pain, palpitations RESP- denies SOB, +cough, wheeze ABD- denies N/V, change in stools, abd pain GU- denies dysuria, hematuria, dribbling, incontinence MSK- +joint pain, muscle aches, injury Neuro- denies headache, dizziness, syncope, seizure activity       Objective:    BP (!) 142/86   Pulse 82   Temp 98.4 F (36.9 C) (Oral)   Resp (!) 24   Ht 5\' 1"  (1.549 m)   Wt 195 lb (88.5 kg)   SpO2 98%   BMI 36.84 kg/m  GEN-  NAD, alert and oriented x3, chronically ill appearing ,walks with rollator HEENT- PERRL, EOMI, non injected sclera, pink conjunctiva, MMM, oropharynx clear,  Neck- Supple, CVS- RRR, no murmur RESP-scattered wheeze, mild rhonchi, increased WOB with talking ABD-NABS,soft,NT,ND Skin- thin skin with bruising on arms/hands EXT- RLE non pitting edema, > Left  edema Pulses- Radial2+        Assessment & Plan:   Pt wants her appointments spread out, states it is too taxing to leave her house   Problem List Items Addressed This Visit      Unprioritized   Chronic diastolic CHF (congestive heart failure) ([place])    Has not taken her Lasix for today.  Did not have CHF exacerbation.  Her blood pressure is mildly elevated but this is very taxing for her to get around the office.  I am not going to change her medication today      Chronic hypoxemic respiratory failure (HCC)   Chronic pain    Pain with multiple compression fractures.  We have scheduled her follow-up with her spine specialist to have kyphoplasty done. Continue Percocet.      COPD exacerbation ([place])    Currently with treatment for yet another COPD exacerbation.  She does not want see the pulmonologist until next month that she cannot handle multiple appointments and leaving the house that much.  She is going to complete her prednisone taper as well as her antibiotic she is using Mucinex and her nebs as prescribed.  She is on 3 L of oxygen  Palliative care is working with patient she has end-stage COPD      Diabetes mellitus without complication ([place])    I cannot find any detailed information with regards to the diabetes and the insulin use.  Not sure why she was given a short acting insulin to use at bedtime.  I will have her stop this.  Her A1c is 7.8%.  As she does have heart failure even though her renal function is preserved I would not start with metformin she also already has difficulty with her bowels.  I have given her  samples of Januvia 100 mg to try.  Also showed her how to use a meter here in the office. CHECK CBG TWICE A DAY . She is going to call with blood sugar readings in 2 weeks.      Relevant Medications   sitaGLIPtin (JANUVIA) 100 MG tablet   Essential hypertension - Primary   Relevant Orders   CBC with Differential/Platelet (Completed)   Comprehensive metabolic panel (Completed)   Leg swelling    Continue Lasix.  She often has more swelling in the right leg than the left       Other Visit Diagnoses    Gallbladder mass       discussed need for [place], wants to wait until next month, will set up for April   Relevant Orders   US Abdomen Limited RUQ      Note: This dictation was prepared with Dragon dictation along with smaller phrase technology. Any transcriptional errors that result from this process are unintentional.

## 2019-03-03 ENCOUNTER — Telehealth: Payer: Self-pay | Admitting: Family Medicine

## 2019-03-03 LAB — COMPREHENSIVE METABOLIC PANEL WITH GFR
AG Ratio: 1.9 (calc) (ref 1.0–2.5)
ALT: 34 U/L — ABNORMAL HIGH (ref 6–29)
AST: 23 U/L (ref 10–35)
Albumin: 3.7 g/dL (ref 3.6–5.1)
Alkaline phosphatase (APISO): 77 U/L (ref 37–153)
BUN: 18 mg/dL (ref 7–25)
CO2: 35 mmol/L — ABNORMAL HIGH (ref 20–32)
Calcium: 9.6 mg/dL (ref 8.6–10.4)
Chloride: 94 mmol/L — ABNORMAL LOW (ref 98–110)
Creat: 0.81 mg/dL (ref 0.50–0.99)
Globulin: 2 g/dL (ref 1.9–3.7)
Glucose, Bld: 252 mg/dL — ABNORMAL HIGH (ref 65–99)
Potassium: 5.3 mmol/L (ref 3.5–5.3)
Sodium: 139 mmol/L (ref 135–146)
Total Bilirubin: 0.3 mg/dL (ref 0.2–1.2)
Total Protein: 5.7 g/dL — ABNORMAL LOW (ref 6.1–8.1)

## 2019-03-03 LAB — CBC WITH DIFFERENTIAL/PLATELET
Absolute Monocytes: 805 {cells}/uL (ref 200–950)
Basophils Absolute: 37 {cells}/uL (ref 0–200)
Basophils Relative: 0.2 %
Eosinophils Absolute: 0 {cells}/uL — ABNORMAL LOW (ref 15–500)
Eosinophils Relative: 0 %
HCT: 39.2 % (ref 35.0–45.0)
Hemoglobin: 12.3 g/dL (ref 11.7–15.5)
Lymphs Abs: 1427 {cells}/uL (ref 850–3900)
MCH: 28.9 pg (ref 27.0–33.0)
MCHC: 31.4 g/dL — ABNORMAL LOW (ref 32.0–36.0)
MCV: 92 fL (ref 80.0–100.0)
MPV: 12.8 fL — ABNORMAL HIGH (ref 7.5–12.5)
Monocytes Relative: 4.4 %
Neutro Abs: 16031 {cells}/uL — ABNORMAL HIGH (ref 1500–7800)
Neutrophils Relative %: 87.6 %
Platelets: 312 10*3/uL (ref 140–400)
RBC: 4.26 Million/uL (ref 3.80–5.10)
RDW: 13.2 % (ref 11.0–15.0)
Total Lymphocyte: 7.8 %
WBC: 18.3 10*3/uL — ABNORMAL HIGH (ref 3.8–10.8)

## 2019-03-03 NOTE — Telephone Encounter (Signed)
Call placed to [facility]. Advised that detailed written order and chart notes are required for Medicare to cover DME supplies, even DM testing supplies. Sent notes and requested DWO to be faxed to [place] for completion.   Call placed to patient and patient made aware. States that she will not start [place] until she can check her sugars. Advised that medication is not going to drop blood sugars drastically, so it is safe to take at this time. States that she does not want to chance it.   MD to be made aware.

## 2019-03-03 NOTE — Telephone Encounter (Signed)
Received call from King at [facility].   Reports that notes have been received. States that chart notes require addendum to state that patient is to check her FSBS 2x daily due to fluctuating blood sugars.   MD to be made aware.

## 2019-03-03 NOTE — Telephone Encounter (Signed)
FYI: Patient states she hasn't started taken the [place] yet since the pharmacy hasn't given her the glucose meter. She said that she will start taken that pill once she is able to check her sugars.  CB# [phone]

## 2019-03-04 ENCOUNTER — Telehealth: Payer: Self-pay | Admitting: Family Medicine

## 2019-03-04 NOTE — Telephone Encounter (Signed)
noted 

## 2019-03-04 NOTE — Telephone Encounter (Signed)
Call placed to [facility].   Refill given on Fanny Cream (Boric Acid 10%, Zinc Oxide, Eucerin, and Vaseline compound).   Call placed to patient and patient made aware.

## 2019-03-04 NOTE — Telephone Encounter (Signed)
FYI:  Patient called in states that she has a place on her bottom that is breaking down she is using the cream that she had left over from when she saw Dr. Dennard Schaumann while Dr. Buelah Manis was on maternity leave. She states that she had to go to [place] to get this cream. This is what I found in office note from Dr. Dennard Schaumann zinc oxide barrier cream  CB# [phone]

## 2019-03-10 DIAGNOSIS — J449 Chronic obstructive pulmonary disease, unspecified: Secondary | ICD-10-CM | POA: Diagnosis not present

## 2019-03-10 DIAGNOSIS — I5032 Chronic diastolic (congestive) heart failure: Secondary | ICD-10-CM | POA: Diagnosis not present

## 2019-03-10 DIAGNOSIS — R0602 Shortness of breath: Secondary | ICD-10-CM | POA: Diagnosis not present

## 2019-03-11 DIAGNOSIS — M8008XA Age-related osteoporosis with current pathological fracture, vertebra(e), initial encounter for fracture: Secondary | ICD-10-CM | POA: Diagnosis not present

## 2019-03-11 DIAGNOSIS — M7918 Myalgia, other site: Secondary | ICD-10-CM | POA: Diagnosis not present

## 2019-03-22 ENCOUNTER — Telehealth: Payer: Self-pay | Admitting: *Deleted

## 2019-03-22 DIAGNOSIS — R609 Edema, unspecified: Secondary | ICD-10-CM

## 2019-03-22 MED ORDER — NYSTATIN 100000 UNIT/GM EX POWD: Freq: Three times a day (TID) | CUTANEOUS | 3 refills | Status: DC

## 2019-03-22 MED ORDER — SPIRONOLACTONE 25 MG PO TABS: 25.0000 mg | ORAL_TABLET | Freq: Every day | ORAL | 2 refills | Status: DC

## 2019-03-22 NOTE — Telephone Encounter (Signed)
Received call from Maurice Small, NP with [facility] [phone]~ telephone.   Reports that patient has been admitted to services. States that patient is voicing c/o increased abd distention and discomfort. NP believes that increased fluid may be building up there and causing pain. Recommendations are as follows: decrease Lasix to 40mg  PO QD. Add Aldactone 25-108mf PO QD for fluid.   MD please advise.

## 2019-03-22 NOTE — Telephone Encounter (Signed)
Pt also stated she has rash beneath breast using baby powder with cornstarch helps some  Given nystatin powder

## 2019-03-22 NOTE — Telephone Encounter (Signed)
I called and lvm for NP with palliative care  Spoke to pt, no change in SOB, her abdomen feels distended but states she is having bowel movements  Her abdomen is tight. She is taking lasix alternating 80mg  and 40mg  with potassium, states she does not feel like the fluid is coming off  Will try aldactone 25mg  daily and lasix 40mg , hold potassium, recheck BMET in 1 week

## 2019-03-23 NOTE — Telephone Encounter (Signed)
Received call from patient.   Requested clarification on fluid pills.   Advised PCP recommends Lasix 40mg  PO QD and Aldactone 25mg  PO QD.   Verbalized understanding.

## 2019-03-31 ENCOUNTER — Telehealth: Payer: Self-pay | Admitting: *Deleted

## 2019-03-31 ENCOUNTER — Other Ambulatory Visit: Payer: Medicare Other

## 2019-03-31 ENCOUNTER — Other Ambulatory Visit: Payer: Self-pay | Admitting: Physician Assistant

## 2019-03-31 NOTE — Telephone Encounter (Signed)
Received call from patient.   Reports that refill on Prednisone is required. Appears to be completing long taper.   MD please advise.

## 2019-04-01 ENCOUNTER — Ambulatory Visit (HOSPITAL_COMMUNITY): Payer: Medicare Other

## 2019-04-01 ENCOUNTER — Other Ambulatory Visit: Payer: Self-pay

## 2019-04-01 ENCOUNTER — Other Ambulatory Visit: Payer: Medicare Other

## 2019-04-01 ENCOUNTER — Telehealth: Payer: Self-pay | Admitting: Family Medicine

## 2019-04-01 DIAGNOSIS — R609 Edema, unspecified: Secondary | ICD-10-CM

## 2019-04-01 MED ORDER — PREDNISONE 10 MG PO TABS: 10.0000 mg | ORAL_TABLET | Freq: Every day | ORAL | 4 refills | Status: DC

## 2019-04-01 MED ORDER — CLOTRIMAZOLE-BETAMETHASONE 1-0.05 % EX CREA: 1.0000 "application " | TOPICAL_CREAM | Freq: Two times a day (BID) | CUTANEOUS | 2 refills | Status: DC

## 2019-04-01 MED ORDER — DOXYCYCLINE HYCLATE 100 MG PO TABS: 100.0000 mg | ORAL_TABLET | Freq: Two times a day (BID) | ORAL | 0 refills | Status: DC

## 2019-04-01 MED ORDER — FLUCONAZOLE 100 MG PO TABS: ORAL_TABLET | ORAL | 0 refills | Status: DC

## 2019-04-01 NOTE — Telephone Encounter (Signed)
Pt states that she needs doxy and prednisone.

## 2019-04-01 NOTE — Telephone Encounter (Signed)
Please see what exactly she needs refilled

## 2019-04-01 NOTE — Telephone Encounter (Signed)
Pt needs refill on her prednisone and medication for yeast infection, she has it in her mouth and on her bottom. [employer].

## 2019-04-01 NOTE — Telephone Encounter (Signed)
  I spoke with patient.  She needs her maintenance prednisone refilled which was 10 mg daily.  She is also stating that she has yeast infection beneath her breast beneath her pannus and in her groin which is itching.  She has tried the powders and also some lotions with no improvement.  She is requesting a refill on the Diflucan.  I Minna place her on fluconazole 100 mg daily for 5 days as she has widespread yeast intertrigo.  I am also going to give her some Lotrisone cream to use that she has significant itching has been taking Benadryl.  She asked about doxycycline to have on hand because of her COPD advised that she does not need this at this time because of her breathing but she may keep a prescription in the house as she is high risk for going out into the community with her severe COPD.

## 2019-04-02 LAB — BASIC METABOLIC PANEL
BUN: 8 mg/dL (ref 7–25)
CO2: 36 mmol/L — ABNORMAL HIGH (ref 20–32)
Calcium: 9.8 mg/dL (ref 8.6–10.4)
Chloride: 90 mmol/L — ABNORMAL LOW (ref 98–110)
Creat: 0.92 mg/dL (ref 0.50–0.99)
Glucose, Bld: 173 mg/dL — ABNORMAL HIGH (ref 65–99)
Potassium: 3.7 mmol/L (ref 3.5–5.3)
Sodium: 140 mmol/L (ref 135–146)

## 2019-04-04 ENCOUNTER — Encounter: Payer: Self-pay | Admitting: Nurse Practitioner

## 2019-04-04 ENCOUNTER — Other Ambulatory Visit: Payer: Self-pay

## 2019-04-04 ENCOUNTER — Ambulatory Visit (INDEPENDENT_AMBULATORY_CARE_PROVIDER_SITE_OTHER): Payer: Medicare Other | Admitting: Nurse Practitioner

## 2019-04-04 DIAGNOSIS — J411 Mucopurulent chronic bronchitis: Secondary | ICD-10-CM

## 2019-04-04 NOTE — Telephone Encounter (Signed)
Medications filled via prior encounter with MD.

## 2019-04-04 NOTE — Assessment & Plan Note (Signed)
Discussion: Patient was in the hospital on 02/24/2019-02/28/2019 for COPD exacerbation.  She was treated with IV steroids and IV antibiotics.  She was discharged home on a prednisone taper and oral doxycycline and Mucinex.  States that she is finished these medications and is doing well.  Fortunately she continues to smoke.  She is on 3 L of O2 nasal cannula continuously.  She takes prednisone 10 mg daily.  She does have palliative care nursing coming out to her house monthly.  She has followed up with her PCP since her hospital discharge.  States that she is compliant with Spiriva and Symbicort.  She uses her albuterol as needed.   Patient Instructions  Please continue Spiriva, Symbicort as you have been taking them. Continue albuterol 2 puffs up to every 4 hours if needed for shortness of breath, wheezing, chest tightness. Please continue prednisone 10 mg every day. Please continue your oxygen at 3 L/min You need to work hard on decreasing your smoking.  This is contributing to both your lung and your heart disease.  Follow with Dr Lamonte Sakai in 4 months or sooner if you have any problems.

## 2019-04-04 NOTE — Patient Instructions (Signed)
Please continue Spiriva, Symbicort as you have been taking them. Continue albuterol 2 puffs up to every 4 hours if needed for shortness of breath, wheezing, chest tightness. Please continue prednisone 10 mg every day. Please continue your oxygen at 3 L/min You need to work hard on decreasing your smoking.  This is contributing to both your lung and your heart disease. Follow with Dr Lamonte Sakai in 4 months or sooner if you have any problems.

## 2019-04-04 NOTE — Progress Notes (Signed)
Virtual Visit via Telephone Note  I connected with Cindy [name] on 04/04/19 at  9:30 AM EDT by telephone and verified that I am speaking with the correct person using two identifiers.   I discussed the limitations, risks, security and privacy concerns of performing an evaluation and management service by telephone and the availability of in person appointments. I also discussed with the patient that there may be a patient responsible charge related to this service. The patient expressed understanding and agreed to proceed.   History of Present Illness: 63 year old female active smoker severe COPD, chronic mucopurulent bronchitis with frequent exacerbations, chronic hypoxic respiratory failure who is followed by Dr. Lamonte Sakai Palliative care is working with patient -end-stage COPD  Patient is a tele-visit today for hospital follow-up.  Patient was in the hospital on 02/24/2019-02/28/2019 for COPD exacerbation.  She was treated with IV steroids and IV antibiotics.  She was discharged home on a prednisone taper and oral doxycycline and Mucinex.  States that she is finished these medications and is doing well.  Fortunately she continues to smoke.  She is on 3 L of O2 nasal cannula continuously.  She takes prednisone 10 mg daily.  Does have palliative care nursing coming out to her house monthly.  She has followed up with her PCP since her hospital discharge.  States that she is compliant with Spiriva and Symbicort.  Does her albuterol as needed. Denies f/c/s, n/v/d, hemoptysis, PND, leg swelling.     Observations/Objective:  CXR 02/25/19 - Emphysematous and chronic bronchitic changes in the lungs. No evidence of active pulmonary disease.  Assessment and Plan: Discussion: Patient was in the hospital on 02/24/2019-02/28/2019 for COPD exacerbation.  She was treated with IV steroids and IV antibiotics.  She was discharged home on a prednisone taper and oral doxycycline and Mucinex.  States that she is finished  these medications and is doing well.  Fortunately she continues to smoke.  She is on 3 L of O2 nasal cannula continuously.  She takes prednisone 10 mg daily.  She does have palliative care nursing coming out to her house monthly.  She has followed up with her PCP since her hospital discharge.  States that she is compliant with Spiriva and Symbicort.  She uses her albuterol as needed.   Patient Instructions  Please continue Spiriva, Symbicort as you have been taking them. Continue albuterol 2 puffs up to every 4 hours if needed for shortness of breath, wheezing, chest tightness. Please continue prednisone 10 mg every day. Please continue your oxygen at 3 L/min You need to work hard on decreasing your smoking.  This is contributing to both your lung and your heart disease.    Follow Up Instructions:   Follow with Dr Lamonte Sakai in 4 months or sooner if you have any problems.  I discussed the assessment and treatment plan with the patient. The patient was provided an opportunity to ask questions and all were answered. The patient agreed with the plan and demonstrated an understanding of the instructions.   The patient was advised to call back or seek an in-person evaluation if the symptoms worsen or if the condition fails to improve as anticipated.  I provided 22 minutes of non-face-to-face time during this encounter.   Cindy Foy, NP

## 2019-04-20 ENCOUNTER — Other Ambulatory Visit: Payer: Self-pay | Admitting: Family Medicine

## 2019-04-20 DIAGNOSIS — J441 Chronic obstructive pulmonary disease with (acute) exacerbation: Secondary | ICD-10-CM

## 2019-04-20 DIAGNOSIS — J9611 Chronic respiratory failure with hypoxia: Secondary | ICD-10-CM

## 2019-04-25 ENCOUNTER — Inpatient Hospital Stay: Payer: Medicare Other | Admitting: Emergency Medicine

## 2019-05-09 ENCOUNTER — Other Ambulatory Visit: Payer: Self-pay | Admitting: Family Medicine

## 2019-05-10 ENCOUNTER — Other Ambulatory Visit: Payer: Self-pay | Admitting: Family Medicine

## 2019-05-10 DIAGNOSIS — J9611 Chronic respiratory failure with hypoxia: Secondary | ICD-10-CM

## 2019-05-10 DIAGNOSIS — J441 Chronic obstructive pulmonary disease with (acute) exacerbation: Secondary | ICD-10-CM

## 2019-05-12 ENCOUNTER — Ambulatory Visit (HOSPITAL_COMMUNITY): Payer: Medicare Other

## 2019-05-18 ENCOUNTER — Telehealth: Payer: Self-pay | Admitting: *Deleted

## 2019-05-18 NOTE — Telephone Encounter (Signed)
Noted, pt scheduled.

## 2019-05-18 NOTE — Telephone Encounter (Signed)
Received call from patient.   States that she continues to have increased swelling in abd. States that she is taking Lasix 40mg  and Aldactone 25mg  daily. Reports that she continues to have pain in area.   Also states that she is having a lot of increased itching.   Appointment scheduled for 05/20/2019 with PCP.

## 2019-05-20 ENCOUNTER — Ambulatory Visit: Payer: Self-pay | Admitting: Family Medicine

## 2019-05-22 IMAGING — DX DG LUMBAR SPINE COMPLETE 4+V
5 series · 5 of 5 positions shown · non-contrast
Comparison: Coronal and sagittal reconstructed images through the
lumbar spine from an abdominal and pelvic CT scan May 25, 2018.

CLINICAL DATA: Right leg and low back pain with no apparent injury

EXAM:
LUMBAR SPINE - COMPLETE 4+ VIEW

[l-spine ap]
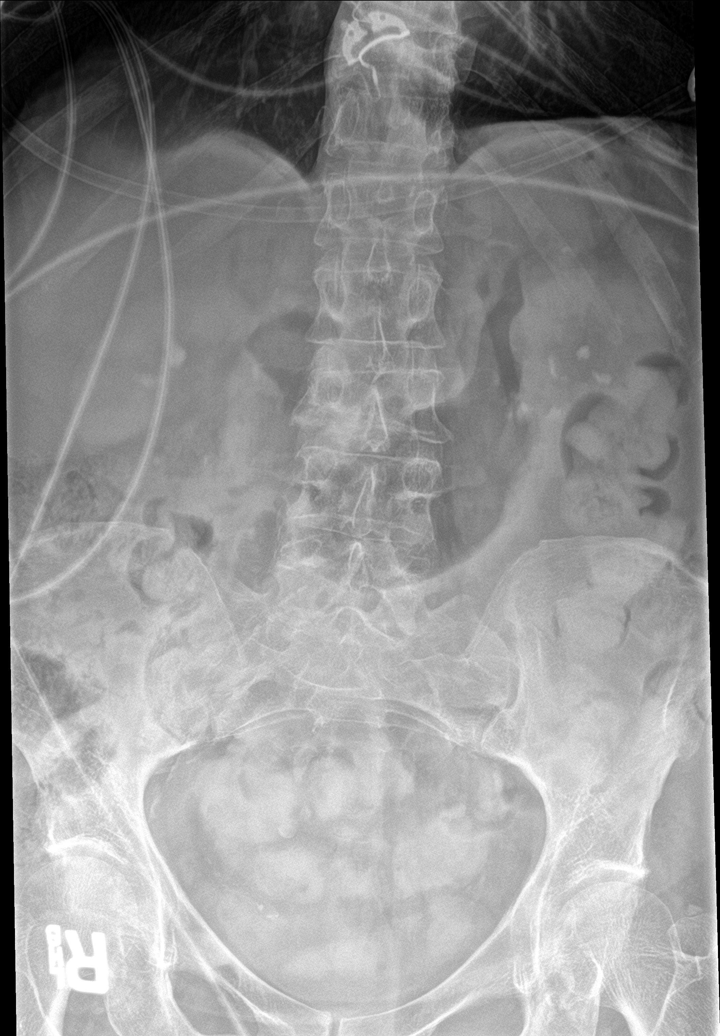

[l-spine obl (1 of 2)]
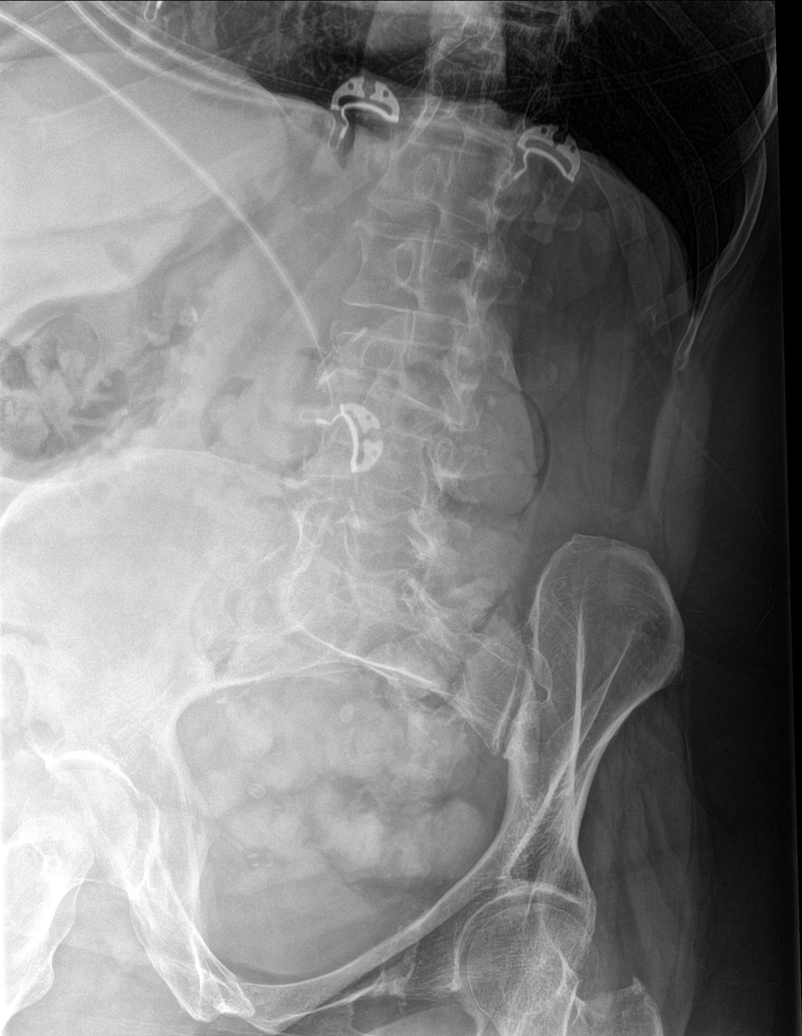

[l-spine obl (2 of 2)]
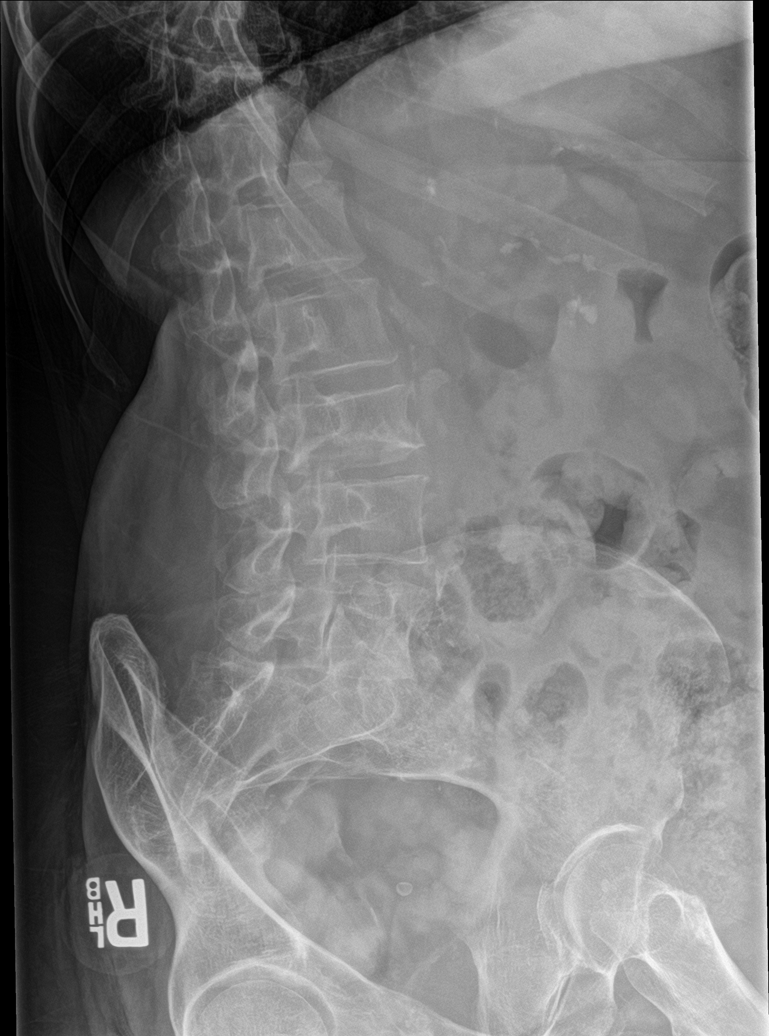

[l-spine lat]
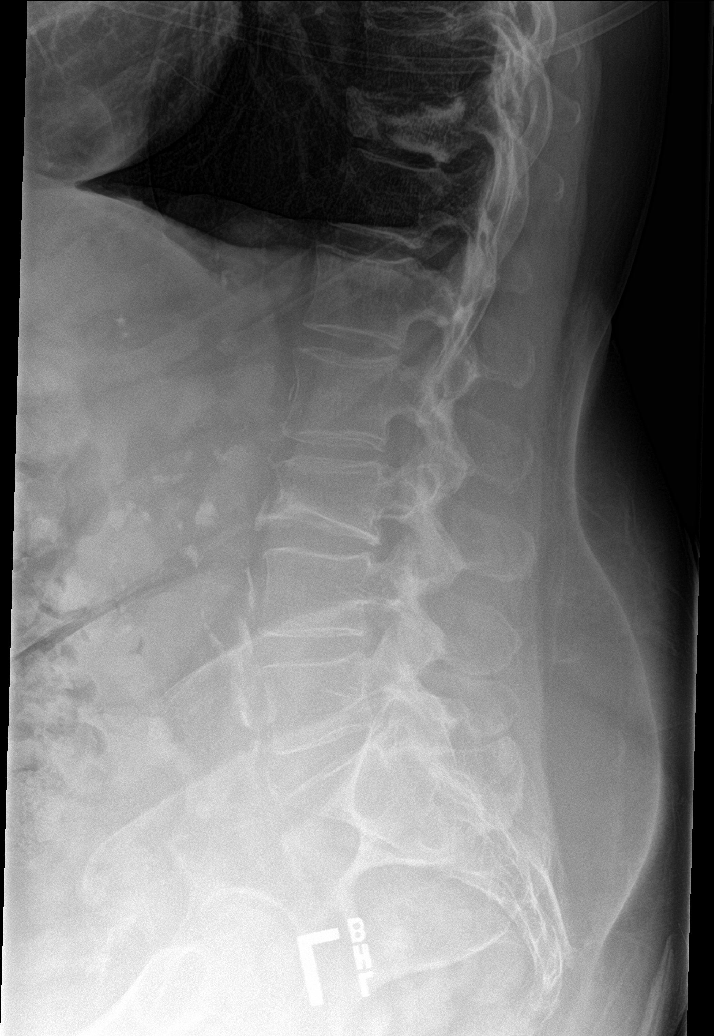

[l-spine spot]
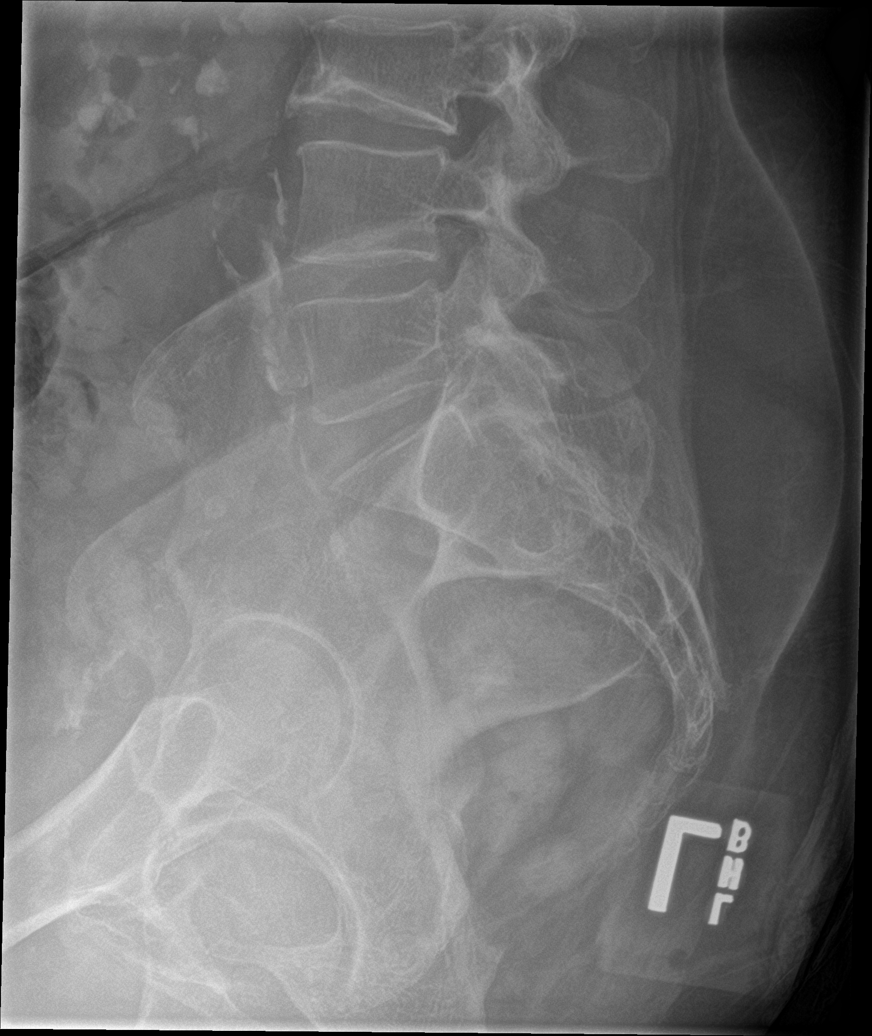

[5 of 5 positions shown; findings below may reference images not displayed]

FINDINGS: There is chronic partial compression of the body of L3 centered on
the inferior endplate. The anterior height loss is approximately
20%. The posterior height loss is 10% or less. There is no
retropulsion of bone. The disc space heights are well maintained.
There is no spondylolisthesis. No pars defects are observed. The
pedicles and transverse processes are intact. The observed portions
of the sacrum are normal. There are calcifications within both
kidneys.
IMPRESSION: Chronic partial compression of the body of L3. No significant disc
space narrowing. No spondylolisthesis.

Bilateral kidney stones.

## 2019-05-31 ENCOUNTER — Other Ambulatory Visit: Payer: Self-pay | Admitting: Family Medicine

## 2019-06-01 ENCOUNTER — Other Ambulatory Visit: Payer: Self-pay | Admitting: Family Medicine

## 2019-06-03 ENCOUNTER — Other Ambulatory Visit: Payer: Self-pay | Admitting: Family Medicine

## 2019-06-06 ENCOUNTER — Other Ambulatory Visit: Payer: Self-pay | Admitting: Physician Assistant

## 2019-06-17 ENCOUNTER — Telehealth: Payer: Self-pay | Admitting: Family Medicine

## 2019-06-17 MED ORDER — DOXYCYCLINE HYCLATE 100 MG PO TABS: 100.0000 mg | ORAL_TABLET | Freq: Two times a day (BID) | ORAL | 0 refills | Status: AC

## 2019-06-17 MED ORDER — SPIRONOLACTONE 25 MG PO TABS: 25.0000 mg | ORAL_TABLET | Freq: Every day | ORAL | 2 refills | Status: DC

## 2019-06-17 MED ORDER — NYSTATIN 100000 UNIT/GM EX POWD: Freq: Three times a day (TID) | CUTANEOUS | 3 refills | Status: DC

## 2019-06-17 NOTE — Addendum Note (Signed)
Addended by: Sheral Flow on: 06/17/2019 04:53 PM   Modules accepted: Orders

## 2019-06-17 NOTE — Telephone Encounter (Signed)
Call placed to patient and patient made aware.   Prescription sent to pharmacy.  

## 2019-06-17 NOTE — Telephone Encounter (Signed)
Call placed to patient to inquire.   States that she has increased swelling in her lower extremities and some blister like areas that weep fluid. States that she has head and chest congestion.  Also reports irritation under abdomen and breasts.   Prescription sent to pharmacy for Nystatin for irritation. Refilled Aldactone for fluid. Advised that if she is having weeping edema, she requires OV to be evaluated.   States that she can't get to office d/t her breathing being bad and requested order for doxycyline. Of note, patient was on the phone with writer for 10 minutes with no SOB noted.   MD please advise.

## 2019-06-17 NOTE — Telephone Encounter (Signed)
Call pt take Lasix 80mg  ( 2 tablets) daily for 3 days, then go back to 1 tablet Okay to send doxycycline 100mg  BID for 7 days

## 2019-06-17 NOTE — Telephone Encounter (Signed)
Patient needs refill on her spironolactone  Please send to [facility] Also would like a call back about a rash she has   [phone]

## 2019-06-27 ENCOUNTER — Telehealth: Payer: Self-pay | Admitting: Family Medicine

## 2019-06-27 ENCOUNTER — Other Ambulatory Visit: Payer: Self-pay

## 2019-06-27 ENCOUNTER — Encounter: Payer: Self-pay | Admitting: Family Medicine

## 2019-06-27 ENCOUNTER — Ambulatory Visit (INDEPENDENT_AMBULATORY_CARE_PROVIDER_SITE_OTHER): Payer: Medicare Other | Admitting: Family Medicine

## 2019-06-27 VITALS — BP 138/82 | HR 93 | Temp 98.2°F | Resp 20

## 2019-06-27 DIAGNOSIS — J9611 Chronic respiratory failure with hypoxia: Secondary | ICD-10-CM | POA: Diagnosis not present

## 2019-06-27 DIAGNOSIS — I2510 Atherosclerotic heart disease of native coronary artery without angina pectoris: Secondary | ICD-10-CM

## 2019-06-27 DIAGNOSIS — J309 Allergic rhinitis, unspecified: Secondary | ICD-10-CM | POA: Diagnosis not present

## 2019-06-27 DIAGNOSIS — B370 Candidal stomatitis: Secondary | ICD-10-CM | POA: Diagnosis not present

## 2019-06-27 DIAGNOSIS — J441 Chronic obstructive pulmonary disease with (acute) exacerbation: Secondary | ICD-10-CM | POA: Diagnosis not present

## 2019-06-27 MED ORDER — PREDNISONE 10 MG PO TABS: 10.0000 mg | ORAL_TABLET | Freq: Every day | ORAL | 4 refills | Status: DC

## 2019-06-27 MED ORDER — PREDNISONE 10 MG PO TABS: ORAL_TABLET | ORAL | 0 refills | Status: DC

## 2019-06-27 MED ORDER — METHYLPREDNISOLONE ACETATE 80 MG/ML IJ SUSP
80.0000 mg | Freq: Once | INTRAMUSCULAR | Status: AC
  Administered 2019-06-27: 80 mg via INTRAMUSCULAR

## 2019-06-27 MED ORDER — IPRATROPIUM-ALBUTEROL 0.5-2.5 (3) MG/3ML IN SOLN
3.0000 mL | Freq: Once | RESPIRATORY_TRACT | Status: AC
  Administered 2019-06-27: 3 mL via RESPIRATORY_TRACT

## 2019-06-27 MED ORDER — IPRATROPIUM BROMIDE 0.03 % NA SOLN: 2.0000 | Freq: Two times a day (BID) | NASAL | 12 refills | Status: DC

## 2019-06-27 MED ORDER — AMOXICILLIN-POT CLAVULANATE 875-125 MG PO TABS: 1.0000 | ORAL_TABLET | Freq: Two times a day (BID) | ORAL | 0 refills | Status: DC

## 2019-06-27 MED ORDER — HYDROCOD POLST-CPM POLST ER 10-8 MG/5ML PO SUER: 5.0000 mL | Freq: Two times a day (BID) | ORAL | 0 refills | Status: DC | PRN

## 2019-06-27 NOTE — Patient Instructions (Addendum)
Get the cool mist humidifier  Take the prednisone taper tomorrow 6/30  Start augmentin  Astelin nasal spray Tussionex for cough  F/U Wed telephone visit for recheck

## 2019-06-27 NOTE — Assessment & Plan Note (Signed)
Advised to swish and swallow the nystatin

## 2019-06-27 NOTE — Assessment & Plan Note (Addendum)
Given duoneb in office and Depo Medrol 80mg  IM This did help her breathing She has been quarantined within home, she does not have fever, but high risk for COVID-19 yet she declined testing today Will treat with prednisone taper down to her baseline 10mg  augmentin given Trial of astelin/tussionex for cough Get humidifer for room Continue oxygen therapy

## 2019-06-27 NOTE — Telephone Encounter (Signed)
Patient requesting a new antibiotic she states the doxycycline isn't working she is requesting augmentin. She would also like cough medicine called in to [facility] on [address] [name]  She is scheduled for Wednesday  CB# [phone]

## 2019-06-27 NOTE — Telephone Encounter (Signed)
Call placed to patient.   Reports that she is still congested and the Doxycycline is not helping. Requested new ABTx. Advised that she needs appointment with MD to eval. Reports that she cannot come to office, but she can do telephone call with PCP. Appointment scheduled.

## 2019-06-27 NOTE — Progress Notes (Signed)
   Subjective:    Patient ID: [place]indy [name], female    DOB: 27-Apr-1956, 63 y.o.   MRN: [id number]  Patient presents for COPD   I connected with [name] on 06/27/19 at 10:19am  by telephone and verified that I am speaking with the correct person using two identifiers.     Pt location: at home   Physician location:  In office, [facility], Vic Blackbird MD     On call: patient and physician   Due to need for steroid injection and lung exam, pt brought into the office   Ptseen in office  At 11:30am    History of Present Illness: She has end stage COPD/ on oxygen,recently finished doxycycline from 6/19 but not improved. Using nasal flonase/nasal saline still congested. Now has has more cough with productive with green drainage that worsened yesterday.  More wheezing as well, using albuterol at least twice a day, currently on prednisone 10mg  daily which is her baseline. She is on 3L of humidifed oxygen No fever, no known COVID 19 exposure, does not want to go to hospital Requested steroid shot and new nasal spray       Review Of Systems:  GEN- denies fatigue, fever, weight loss,weakness, recent illness HEENT- denies eye drainage, change in vision, nasal discharge, CVS- denies chest pain, palpitations RESP- + SOB,+ cough+, wheeze ABD- denies N/V, change in stools, abd pain GU- denies dysuria, hematuria, dribbling, incontinence MSK- + joint pain, muscle aches, injury Neuro- denies headache, dizziness, syncope, seizure activity       Objective:    BP 138/82   Pulse 93   Temp 98.2 F (36.8 C) (Oral)   Resp 20   SpO2 95%  3 liters GEN- NAD, alert and oriented x3,SITTING IN WHEELCHAIR  HEENT- PERRL, EOMI, non injected sclera, pink conjunctiva, MMM, oropharynx thrush, nares clear rhinorrhea, no sinus tenderness Neck- Supple, no LAD CVS- RRR, no murmur RESP-bilat wheeze, increased WOB, rhonchi bilat, has audible wheeze but also moans at  baseline ABD-NABS,soft,NT,ND EXT-pedal edema Pulses- Radial 2+        Assessment & Plan:   recheck in  48 hours , pt given red flags for ER    Problem List Items Addressed This Visit      Unprioritized   Allergic rhinitis   Chronic hypoxemic respiratory failure (HCC)   COPD exacerbation (HCC) - Primary    Given duoneb in office and Depo Medrol 80mg  IM This did help her breathing She has been quarantined within home, she does not have fever, but high risk for COVID-19 yet she declined testing today Will treat with prednisone taper down to her baseline 10mg  augmentin given Trial of astelin/tussionex for cough Get humidifer for room Continue oxygen therapy      Relevant Medications   predniSONE (DELTASONE) 10 MG tablet   ipratropium (ATROVENT) 0.03 % nasal spray   predniSONE (DELTASONE) 10 MG tablet   chlorpheniramine-HYDROcodone (TUSSIONEX PENNKINETIC ER) 10-8 MG/5ML SUER   Thrush    Advised to swish and swallow the nystatin         Note: This dictation was prepared with Dragon dictation along with smaller phrase technology. Any transcriptional errors that result from this process are unintentional.

## 2019-06-29 ENCOUNTER — Ambulatory Visit: Payer: Medicare Other | Admitting: Family Medicine

## 2019-06-29 ENCOUNTER — Ambulatory Visit: Payer: Self-pay | Admitting: Family Medicine

## 2019-06-29 ENCOUNTER — Other Ambulatory Visit: Payer: Self-pay | Admitting: *Deleted

## 2019-06-29 NOTE — Telephone Encounter (Signed)
Received fax requesting alternative to Tussionex. Reports that medication is not covered by insurance.   Of note, no cough medication is covered by Medicare.   Call placed to pharmacy. Reports that patient picked up prescription and paid out of pocket. Tussionex is $38 out of pocket.

## 2019-06-30 ENCOUNTER — Telehealth: Payer: Self-pay | Admitting: *Deleted

## 2019-06-30 NOTE — Telephone Encounter (Signed)
Call placed to patient.   Reports that she is back to her baseline in regards to her breathing. States that she has no fever.   MD to be made aware.

## 2019-06-30 NOTE — Telephone Encounter (Signed)
-----   Message from Alycia Rossetti, MD sent at 06/29/2019  8:52 PM EDT ----- Regarding: Call and check on breathing,not improved needs COVID testing if she will agree to go today

## 2019-06-30 NOTE — Telephone Encounter (Signed)
noted 

## 2019-07-18 ENCOUNTER — Other Ambulatory Visit: Payer: Self-pay | Admitting: Family Medicine

## 2019-07-19 ENCOUNTER — Other Ambulatory Visit: Payer: Self-pay | Admitting: Family Medicine

## 2019-07-25 ENCOUNTER — Telehealth: Payer: Self-pay | Admitting: Emergency Medicine

## 2019-07-25 ENCOUNTER — Other Ambulatory Visit: Payer: Self-pay | Admitting: Family Medicine

## 2019-07-25 MED ORDER — PREDNISONE 10 MG PO TABS: 10.0000 mg | ORAL_TABLET | Freq: Every day | ORAL | 2 refills | Status: DC

## 2019-07-25 NOTE — Telephone Encounter (Signed)
Call returned to patient, requesting a refill of prednisone 10. Per Last AVS continue prednisone 10mg  daily per TN and RB last OV. Refill sent in. Confirmed tele-visit for 08/18. Voiced understanding. Nothing further needed at this time.

## 2019-07-29 ENCOUNTER — Other Ambulatory Visit: Payer: Self-pay | Admitting: Family Medicine

## 2019-08-01 ENCOUNTER — Other Ambulatory Visit: Payer: Self-pay | Admitting: *Deleted

## 2019-08-01 ENCOUNTER — Telehealth: Payer: Self-pay | Admitting: Family Medicine

## 2019-08-01 MED ORDER — FLUCONAZOLE 100 MG PO TABS: ORAL_TABLET | ORAL | 0 refills | Status: DC

## 2019-08-01 NOTE — Telephone Encounter (Signed)
Dose changed to fluconazole 100mg  daily for 5 days

## 2019-08-01 NOTE — Telephone Encounter (Signed)
Received call from patient.   States that she has thick white coating to her tongue and requested refill on Diflucan.   Ok to refill?

## 2019-08-16 ENCOUNTER — Ambulatory Visit: Payer: Medicare Other | Admitting: Emergency Medicine

## 2019-08-16 ENCOUNTER — Encounter: Payer: Self-pay | Admitting: Emergency Medicine

## 2019-08-16 ENCOUNTER — Ambulatory Visit (INDEPENDENT_AMBULATORY_CARE_PROVIDER_SITE_OTHER): Payer: Medicare Other | Admitting: Emergency Medicine

## 2019-08-16 ENCOUNTER — Other Ambulatory Visit: Payer: Self-pay

## 2019-08-16 DIAGNOSIS — Z720 Tobacco use: Secondary | ICD-10-CM

## 2019-08-16 DIAGNOSIS — F17210 Nicotine dependence, cigarettes, uncomplicated: Secondary | ICD-10-CM | POA: Diagnosis not present

## 2019-08-16 DIAGNOSIS — J9611 Chronic respiratory failure with hypoxia: Secondary | ICD-10-CM

## 2019-08-16 DIAGNOSIS — J309 Allergic rhinitis, unspecified: Secondary | ICD-10-CM

## 2019-08-16 DIAGNOSIS — J411 Mucopurulent chronic bronchitis: Secondary | ICD-10-CM

## 2019-08-16 NOTE — Progress Notes (Signed)
Virtual Visit via Video Note  I connected with Cindy [name] on 08/16/19 at  9:30 AM EDT by a video enabled telemedicine application and verified that I am speaking with the correct person using two identifiers.  Location: Patient: Home  Provider: Office   I discussed the limitations of evaluation and management by telemedicine and the availability of in person appointments. The patient expressed understanding and agreed to proceed.  History of Present Illness: 63 year old active smoker with severe COPD and chronic bronchitic phenotype, chronic cough, associated hypoxemia.  Also with CAD and ischemic systolic CHF.  She has frequent flares, averages antibiotics and/or prednisone taper every few months.  She is been managed on chronic prednisone 10 mg daily.  Current bronchodilator regimen Spiriva, Symbicort.  She uses oxygen at 3 L/min.   Observations/Objective: She reports that she has been stable since most recent flare - was treated w steroids and augmentin late June. She reports that she has her baseline exertional dyspnea, uses her o2 at all times 3L/min. Uses albuterol via neb prn - uses about 2x a day. She has cough, is using mucinex. She is using flonase prn. She is smoking about 1 pk/day.   Assessment and Plan: COPD (chronic obstructive pulmonary disease) ([place]) Overall clinically stable although she does have significant daily symptoms.  We will continue her current prednisone 10 mg, current bronchodilator regimen.  Mucinex, pulmonary hygiene for secretion clearance  Chronic hypoxemic respiratory failure (HCC) Continue 3 L/min at all times  Allergic rhinitis She is using Flonase only as needed.  Could consider increasing to daily on a schedule.  We discussed this.  Tobacco abuse Discussed her smoking, efforts at cutting down.  She not ready to set a quit date.  We will try to support her in any way we can to decrease.   Follow Up Instructions: 1-2 months with RB   I  discussed the assessment and treatment plan with the patient. The patient was provided an opportunity to ask questions and all were answered. The patient agreed with the plan and demonstrated an understanding of the instructions.   The patient was advised to call back or seek an in-person evaluation if the symptoms worsen or if the condition fails to improve as anticipated.  I provided 19 minutes of non-face-to-face time during this encounter.   Collene Gobble, MD

## 2019-08-16 NOTE — Assessment & Plan Note (Signed)
Continue 3 L/min at all times °

## 2019-08-16 NOTE — Assessment & Plan Note (Signed)
Overall clinically stable although she does have significant daily symptoms.  We will continue her current prednisone 10 mg, current bronchodilator regimen.  Mucinex, pulmonary hygiene for secretion clearance

## 2019-08-16 NOTE — Assessment & Plan Note (Signed)
She is using Flonase only as needed.  Could consider increasing to daily on a schedule.  We discussed this.

## 2019-08-16 NOTE — Assessment & Plan Note (Signed)
Discussed her smoking, efforts at cutting down.  She not ready to set a quit date.  We will try to support her in any way we can to decrease.

## 2019-08-19 ENCOUNTER — Other Ambulatory Visit: Payer: Self-pay | Admitting: Family Medicine

## 2019-09-08 ENCOUNTER — Telehealth: Payer: Self-pay | Admitting: *Deleted

## 2019-09-08 MED ORDER — LINACLOTIDE 145 MCG PO CAPS: 145.0000 ug | ORAL_CAPSULE | Freq: Every day | ORAL | 3 refills | Status: DC

## 2019-09-08 NOTE — Telephone Encounter (Signed)
Received message from fellow nurse to contact patient. Call placed to patient to inquire. Patient began conversation very agitated as she states that she has been trying to call office and leaving messages on VM without having her call returned. Noted that (1) message was left on VM on 09/07/2019 and writer did attempt to connect to patient with no answer on 09/07/2019.   Patient reports that she is having increased congestion and can't breathe through her nose. States that she wants refill on Prednisone taper and Doxycycline.   Also states that she is having another outbreak of shingles on her buttocks, but no Dx of shingles noted in chart.  Reports red irritated areas to (B) buttocks and lower abdomen. States that she is using a salve to areas, but is unclear what she is using.  Also requested prescription for Linzess. States that she has been given samples and they are effective. Prescription sent to pharmacy.   Of note, during call patient showed no Sx of having SOB or difficulty breathing and no cough heard.   Advised that if she feels she is having a COPD exacerbation to either contact pulmonology for further recommendations or go to ER for evaluation. States that she only wants to talk with PCP to get her medications and cough syrup. Advised that no appointments are available at this time multiple times. Patient was very abrupt and would not accept recommendations as there are not available appointments. Patient became very agitated and states that she doesn't need an appointment to talk to PCP over the phone. Attempted to explain that this is still considered an appointment as it is a scheduled time for the provider to connect with the patient, but patient cut off phone call with a "Bye" and hung up.    PCP to be made aware.

## 2019-09-09 NOTE — Telephone Encounter (Signed)
Noted pt declined being seen or telehealth visit

## 2019-09-14 ENCOUNTER — Other Ambulatory Visit: Payer: Self-pay | Admitting: Family Medicine

## 2019-09-14 DIAGNOSIS — K219 Gastro-esophageal reflux disease without esophagitis: Secondary | ICD-10-CM

## 2019-09-20 ENCOUNTER — Ambulatory Visit (INDEPENDENT_AMBULATORY_CARE_PROVIDER_SITE_OTHER): Payer: Medicare Other | Admitting: Family Medicine

## 2019-09-20 ENCOUNTER — Encounter: Payer: Self-pay | Admitting: Family Medicine

## 2019-09-20 DIAGNOSIS — I2510 Atherosclerotic heart disease of native coronary artery without angina pectoris: Secondary | ICD-10-CM

## 2019-09-20 DIAGNOSIS — J441 Chronic obstructive pulmonary disease with (acute) exacerbation: Secondary | ICD-10-CM | POA: Diagnosis not present

## 2019-09-20 DIAGNOSIS — B370 Candidal stomatitis: Secondary | ICD-10-CM

## 2019-09-20 DIAGNOSIS — J0100 Acute maxillary sinusitis, unspecified: Secondary | ICD-10-CM

## 2019-09-20 MED ORDER — GUAIFENESIN-CODEINE 100-10 MG/5ML PO SOLN: 5.0000 mL | Freq: Four times a day (QID) | ORAL | 0 refills | Status: DC | PRN

## 2019-09-20 MED ORDER — FLUCONAZOLE 100 MG PO TABS: 100.0000 mg | ORAL_TABLET | Freq: Every day | ORAL | 0 refills | Status: DC

## 2019-09-20 MED ORDER — DOXYCYCLINE HYCLATE 100 MG PO TABS: 100.0000 mg | ORAL_TABLET | Freq: Two times a day (BID) | ORAL | 0 refills | Status: DC

## 2019-09-20 MED ORDER — PREDNISONE 20 MG PO TABS: ORAL_TABLET | ORAL | 0 refills | Status: DC

## 2019-09-20 NOTE — Progress Notes (Signed)
Virtual Visit via Telephone Note  I connected with Cindy [name] on 09/20/19 at 10:05am by telephone and verified that I am speaking with the correct person using two identifiers.       Pt location: at home   Physician location:  In office, [facility], Vic Blackbird MD     On call: patient and physician   I discussed the limitations, risks, security and privacy concerns of performing an evaluation and management service by telephone and the availability of in person appointments. I also discussed with the patient that there may be a patient responsible charge related to this service. The patient expressed understanding and agreed to proceed.   History of Present Illness: She has severe end COPD, chronic nasal congestion. She has tried saline, flonase, OTC meds, nebs   She is clogged up, has pain in sinus pressure and drainage, sore throat She is using her inhalers and nebulizers  Her thrush is worse as well, feels like it is down in her throat, and throat is red  No fever, no chills , some WOB, and wheezing, cough has  production  She is on oxygen 3Liters all day    She also states that wound is back on her buttocks- using fanny cream now that is better, also using a dressing , no odor from wound  Observations/Objective:  No SOB noted on phone, no audible wheeze, speaking in full sentences   Assessment and Plan:  COPD excarbation- given doxycycline , prednisone burst, robitussin AC- she does not take any pain meds with cough med   Thrush- recurrent thrush, she does respond to diflucan tablets, also has   Acute sinusitis - continue nasal rinses, chronic problem with nasal congestion but has some sinusitis features, antibiotics per above  Note pt declines getting COVID-19 testing or going to ER  She also had questions about changing her aldactone, will schedule OV for recheck on Friday, she agrees to come in  Follow Up Instructions:    I discussed the assessment and  treatment plan with the patient. The patient was provided an opportunity to ask questions and all were answered. The patient agreed with the plan and demonstrated an understanding of the instructions.   The patient was advised to call back or seek an in-person evaluation if the symptoms worsen or if the condition fails to improve as anticipated.  I provided 20  minutes of non-face-to-face time during this encounter. eND TIME 10:25AM  Vic Blackbird, MD

## 2019-09-20 NOTE — Patient Instructions (Signed)
F/U Friday at 11am

## 2019-09-23 ENCOUNTER — Ambulatory Visit: Payer: Medicare Other | Admitting: Family Medicine

## 2019-09-27 ENCOUNTER — Ambulatory Visit (INDEPENDENT_AMBULATORY_CARE_PROVIDER_SITE_OTHER): Payer: Medicare Other | Admitting: Family Medicine

## 2019-09-27 ENCOUNTER — Ambulatory Visit: Payer: Medicare Other | Admitting: Family Medicine

## 2019-09-27 ENCOUNTER — Encounter: Payer: Self-pay | Admitting: Family Medicine

## 2019-09-27 ENCOUNTER — Other Ambulatory Visit: Payer: Self-pay

## 2019-09-27 VITALS — BP 140/88 | HR 102 | Temp 98.4°F | Resp 18 | Ht 61.0 in | Wt 190.0 lb

## 2019-09-27 DIAGNOSIS — I5032 Chronic diastolic (congestive) heart failure: Secondary | ICD-10-CM | POA: Diagnosis not present

## 2019-09-27 DIAGNOSIS — Z23 Encounter for immunization: Secondary | ICD-10-CM

## 2019-09-27 DIAGNOSIS — J441 Chronic obstructive pulmonary disease with (acute) exacerbation: Secondary | ICD-10-CM | POA: Diagnosis not present

## 2019-09-27 DIAGNOSIS — I10 Essential (primary) hypertension: Secondary | ICD-10-CM | POA: Diagnosis not present

## 2019-09-27 DIAGNOSIS — I2510 Atherosclerotic heart disease of native coronary artery without angina pectoris: Secondary | ICD-10-CM

## 2019-09-27 DIAGNOSIS — L851 Acquired keratosis [keratoderma] palmaris et plantaris: Secondary | ICD-10-CM

## 2019-09-27 DIAGNOSIS — J9611 Chronic respiratory failure with hypoxia: Secondary | ICD-10-CM | POA: Diagnosis not present

## 2019-09-27 DIAGNOSIS — E119 Type 2 diabetes mellitus without complications: Secondary | ICD-10-CM

## 2019-09-27 DIAGNOSIS — B370 Candidal stomatitis: Secondary | ICD-10-CM | POA: Diagnosis not present

## 2019-09-27 MED ORDER — IPRATROPIUM-ALBUTEROL 0.5-2.5 (3) MG/3ML IN SOLN
3.0000 mL | Freq: Once | RESPIRATORY_TRACT | Status: AC
  Administered 2019-09-27: 3 mL via RESPIRATORY_TRACT

## 2019-09-27 MED ORDER — METHYLPREDNISOLONE ACETATE 40 MG/ML IJ SUSP
40.0000 mg | Freq: Once | INTRAMUSCULAR | Status: AC
  Administered 2019-09-27: 40 mg via INTRAMUSCULAR

## 2019-09-27 MED ORDER — PREDNISONE 10 MG PO TABS: 10.0000 mg | ORAL_TABLET | Freq: Every day | ORAL | 3 refills | Status: DC

## 2019-09-27 NOTE — Patient Instructions (Addendum)
Take Linzess once a day   okay to continue metamucil Continue lasix  Complete your antibiotics  Restart prednisone 10mg  once a day  Flu shot given  F/U 4 months for Physical

## 2019-09-27 NOTE — Assessment & Plan Note (Signed)
Rinse and swallow nystatin Rinse mouth after steroids

## 2019-09-27 NOTE — Assessment & Plan Note (Signed)
Recheck A1c, on chronic prednisone

## 2019-09-27 NOTE — Assessment & Plan Note (Signed)
Weight down 5lbs since March Mild pedal edema, but does not elevate feet and declines compression hose Continue lasix 40mg  and aldactone 25mg 

## 2019-09-27 NOTE — Assessment & Plan Note (Signed)
Mild elevation but with her COPD, taxing to move around, BP and HR tends to go up  Will not change BB

## 2019-09-27 NOTE — Assessment & Plan Note (Signed)
Complete antibiotics, she asked for steroid shot today Given Depo Medrol 40mg , resume prednisone 10mg  starting tomorrow Continue nebs, inhaler, allergy meds, oxygen therapy

## 2019-09-27 NOTE — Progress Notes (Signed)
Subjective:    Patient ID: Cindy [name], female    DOB: 1956/08/07, 63 y.o.   MRN: [id number]  Patient presents for Follow-up (COPD) For an office follow-up on her COPD.  She also questions about her spironolactone Per my office visit note on 9/22 she declined [place] testing.  She was treated with prednisone burst and taper back down to her 10 mg daily she was also given doxycycline and Robitussin with codeine.  She still has 3 days left of antibiotics.   -Her maintenance and medications include Symbicort, Spiriva albuterol nebs, redness and 10 mg For her recurrent thrush she was given Diflucan tablets and also continue on her nystatin liquid, but she has not been swallowing the liquid   Also complained of her chronic sinus drainage she is wearing oxygen which contributes to some of the dryness although it is humidified.  We have given her multiple nasal steroids and rinses which helped minimally.  She currently has Atrovent nasal spray and nasal saline  Spironolactone she is on this along with Lasix secondary to fluid retention/ diatolic CHF   She has been taking lasix 40mg  once a day and spironolactone    She has more girth around around her stomach    Chronic constipation- she is taking linzess occ, most days, miralax and  metamucil ,s till has constipation   Hypertension- she is taking metoprolol   She does get GERD symptoms, often will take mylanta , but most of the time it is controlled twice a day    diabestes last a1c 7.8% in feb , No current meds   Concerned about some spots that come up on her feet, they dont itch she can peel them off at times   Review Of Systems:  GEN- denies fatigue, fever, weight loss,weakness, recent illness HEENT- denies eye drainage, change in vision, nasal discharge, CVS- denies chest pain, palpitations RESP- denies SOB, +cough, wheeze ABD- denies N/V, change in stools, abd pain GU- denies dysuria, hematuria, dribbling, incontinence MSK- denies  joint pain, muscle aches, injury Neuro- denies headache, dizziness, syncope, seizure activity       Objective:    BP 140/88   Pulse (!) 102   Temp 98.4 F (36.9 C) (Oral)   Resp 18   Ht 5\' 1"  (1.549 m)   Wt 190 lb (86.2 kg)   SpO2 93% Comment: 3L/min via [place]  BMI 35.90 kg/m  GEN- NAD, alert and oriented x3 HEENT- PERRL, EOMI, non injected sclera, pink conjunctiva, MMM, oropharynx clear Neck- Supple, no thyromegaly CVS- RR , mild tachycardia  no murmur - distant HS RESP-scattered wheeze, mild rhonchi, normal WOB at rest on 3L ,SUBJECTIVE:/p neb improved wheeze  ABD-NABS,soft,NT,ND EXT- pedal edema R >L  Skin- flesh toned keratosis on feet and above ankles  Pulses- Radial, DP- 2+        Assessment & Plan:      Problem List Items Addressed This Visit      Unprioritized   Chronic diastolic CHF (congestive heart failure) (HCC)    Weight down 5lbs since March Mild pedal edema, but does not elevate feet and declines compression hose Continue lasix 40mg  and aldactone 25mg        Chronic hypoxemic respiratory failure (HCC)   COPD exacerbation (HCC)    Complete antibiotics, she asked for steroid shot today Given Depo Medrol 40mg , resume prednisone 10mg  starting tomorrow Continue nebs, inhaler, allergy meds, oxygen therapy        Relevant Medications   predniSONE (  DELTASONE) 10 MG tablet   Diabetes mellitus without complication (HCC)    Recheck A1c, on chronic prednisone      Relevant Orders   Hemoglobin A1c   Lipid panel   Microalbumin / creatinine urine ratio   HM DIABETES FOOT EXAM (Completed)   Essential hypertension - Primary    Mild elevation but with her COPD, taxing to move around, BP and HR tends to go up  Will not change BB      Relevant Orders   CBC with Differential/Platelet   Comprehensive metabolic panel   Stucco keratoses   Thrush    Rinse and swallow nystatin Rinse mouth after steroids        Other Visit Diagnoses    Need for  immunization against influenza       Transportation is taxing for her, she is on chronic prednisone, so will go ahead and give flu s hot   Relevant Orders   Flu Vaccine QUAD 36+ mos IM (Completed)      Note: This dictation was prepared with Dragon dictation along with smaller phrase technology. Any transcriptional errors that result from this process are unintentional.

## 2019-09-28 LAB — CBC WITH DIFFERENTIAL/PLATELET
Absolute Monocytes: 1268 cells/uL — ABNORMAL HIGH (ref 200–950)
Basophils Absolute: 78 cells/uL (ref 0–200)
Basophils Relative: 0.4 %
Eosinophils Absolute: 20 cells/uL (ref 15–500)
Eosinophils Relative: 0.1 %
HCT: 41.2 % (ref 35.0–45.0)
Hemoglobin: 13.2 g/dL (ref 11.7–15.5)
Lymphs Abs: 2477 cells/uL (ref 850–3900)
MCH: 29.7 pg (ref 27.0–33.0)
MCHC: 32 g/dL (ref 32.0–36.0)
MCV: 92.6 fL (ref 80.0–100.0)
MPV: 12.4 fL (ref 7.5–12.5)
Monocytes Relative: 6.5 %
Neutro Abs: 15659 cells/uL — ABNORMAL HIGH (ref 1500–7800)
Neutrophils Relative %: 80.3 %
Platelets: 295 10*3/uL (ref 140–400)
RBC: 4.45 10*6/uL (ref 3.80–5.10)
RDW: 13.1 % (ref 11.0–15.0)
Total Lymphocyte: 12.7 %
WBC: 19.5 10*3/uL — ABNORMAL HIGH (ref 3.8–10.8)

## 2019-09-28 LAB — COMPREHENSIVE METABOLIC PANEL
AG Ratio: 1.6 (calc) (ref 1.0–2.5)
ALT: 34 U/L — ABNORMAL HIGH (ref 6–29)
AST: 26 U/L (ref 10–35)
Albumin: 3.8 g/dL (ref 3.6–5.1)
Alkaline phosphatase (APISO): 78 U/L (ref 37–153)
BUN: 14 mg/dL (ref 7–25)
CO2: 36 mmol/L — ABNORMAL HIGH (ref 20–32)
Calcium: 9.9 mg/dL (ref 8.6–10.4)
Chloride: 88 mmol/L — ABNORMAL LOW (ref 98–110)
Creat: 0.92 mg/dL (ref 0.50–0.99)
Globulin: 2.4 g/dL (calc) (ref 1.9–3.7)
Glucose, Bld: 161 mg/dL — ABNORMAL HIGH (ref 65–99)
Potassium: 4.1 mmol/L (ref 3.5–5.3)
Sodium: 137 mmol/L (ref 135–146)
Total Bilirubin: 0.4 mg/dL (ref 0.2–1.2)
Total Protein: 6.2 g/dL (ref 6.1–8.1)

## 2019-09-28 LAB — HEMOGLOBIN A1C
Hgb A1c MFr Bld: 8.4 % of total Hgb — ABNORMAL HIGH (ref <5.7)
Mean Plasma Glucose: 194 (calc)
eAG (mmol/L): 10.8 (calc)

## 2019-09-28 LAB — MICROALBUMIN / CREATININE URINE RATIO
Creatinine, Urine: 62 mg/dL (ref 20–275)
Microalb Creat Ratio: 26 mcg/mg creat (ref <30)
Microalb, Ur: 1.6 mg/dL

## 2019-09-28 LAB — LIPID PANEL
Cholesterol: 175 mg/dL (ref <200)
HDL: 52 mg/dL (ref >=50)
LDL Cholesterol (Calc): 92 mg/dL (calc)
Non-HDL Cholesterol (Calc): 123 mg/dL (calc) (ref <130)
Total CHOL/HDL Ratio: 3.4 (calc) (ref <5.0)
Triglycerides: 223 mg/dL — ABNORMAL HIGH (ref <150)

## 2019-09-29 ENCOUNTER — Other Ambulatory Visit: Payer: Self-pay | Admitting: *Deleted

## 2019-09-29 MED ORDER — BLOOD GLUCOSE SYSTEM PAK KIT: PACK | 1 refills | Status: AC

## 2019-09-29 MED ORDER — BLOOD GLUCOSE TEST VI STRP: ORAL_STRIP | 1 refills | Status: AC

## 2019-09-29 MED ORDER — GLIPIZIDE 5 MG PO TABS: 5.0000 mg | ORAL_TABLET | Freq: Two times a day (BID) | ORAL | 3 refills | Status: DC

## 2019-09-29 MED ORDER — LANCETS MISC: 11 refills | Status: DC

## 2019-09-30 ENCOUNTER — Ambulatory Visit: Payer: Medicare Other | Admitting: Family Medicine

## 2019-10-07 ENCOUNTER — Other Ambulatory Visit: Payer: Self-pay

## 2019-10-07 ENCOUNTER — Other Ambulatory Visit: Payer: Self-pay | Admitting: Family Medicine

## 2019-10-07 DIAGNOSIS — J9611 Chronic respiratory failure with hypoxia: Secondary | ICD-10-CM

## 2019-10-07 DIAGNOSIS — J441 Chronic obstructive pulmonary disease with (acute) exacerbation: Secondary | ICD-10-CM

## 2019-10-07 MED ORDER — SPIRIVA RESPIMAT 2.5 MCG/ACT IN AERS: INHALATION_SPRAY | RESPIRATORY_TRACT | 2 refills | Status: DC

## 2019-10-07 MED ORDER — SPIRONOLACTONE 25 MG PO TABS: 25.0000 mg | ORAL_TABLET | Freq: Every day | ORAL | 2 refills | Status: DC

## 2019-10-17 ENCOUNTER — Ambulatory Visit (INDEPENDENT_AMBULATORY_CARE_PROVIDER_SITE_OTHER): Payer: Medicare Other | Admitting: Emergency Medicine

## 2019-10-17 ENCOUNTER — Other Ambulatory Visit: Payer: Self-pay | Admitting: Family Medicine

## 2019-10-17 ENCOUNTER — Encounter: Payer: Self-pay | Admitting: Emergency Medicine

## 2019-10-17 DIAGNOSIS — J411 Mucopurulent chronic bronchitis: Secondary | ICD-10-CM | POA: Diagnosis not present

## 2019-10-17 DIAGNOSIS — Z720 Tobacco use: Secondary | ICD-10-CM

## 2019-10-17 DIAGNOSIS — J309 Allergic rhinitis, unspecified: Secondary | ICD-10-CM | POA: Diagnosis not present

## 2019-10-17 DIAGNOSIS — J9611 Chronic respiratory failure with hypoxia: Secondary | ICD-10-CM | POA: Diagnosis not present

## 2019-10-17 NOTE — Assessment & Plan Note (Signed)
Continue O2 at 3L/min at all times.

## 2019-10-17 NOTE — Assessment & Plan Note (Signed)
Continue same regimen 

## 2019-10-17 NOTE — Assessment & Plan Note (Signed)
Discussed the connection between her tobacco and her sx.

## 2019-10-17 NOTE — Progress Notes (Signed)
Virtual Visit via Telephone Note  I connected with Cindy [name] on 10/17/19 at  9:00 AM EDT by telephone and verified that I am speaking with the correct person using two identifiers.  Location: Patient: Home  Provider: Office   I discussed the limitations, risks, security and privacy concerns of performing an evaluation and management service by telephone and the availability of in person appointments. I also discussed with the patient that there may be a patient responsible charge related to this service. The patient expressed understanding and agreed to proceed.   History of Present Illness: Cindy [name] is a 30 and continues to smoke.  She has severe COPD with a chronic bronchitic phenotype.  She flares frequently, deals with chronic cough.  She also has hypoxemia and is on oxygen at 3 L/min.  We have been managing her on chronic prednisone with fair results, she continues to have flares every few months.   Observations/Objective: Review of the notes indicates that she had an office visit 9/22 with Dr. Buelah Manis, was treated for a COPD flare at that time > received solumedrol and a short course pred. She reports that she is starting glipizide for newly treated diabetes. Her breathing is stable. She has daily cough, intermittently productive of mucous. Stable on pred 10mg  qd. Remains on spiriva and symbicort. Uses albuterol about 2-3x a day. Received flu shot. She is smoking about 1 pk/day.   Assessment and Plan: Chronic hypoxemic respiratory failure (HCC) Continue O2 at 3L/min at all times.   COPD (chronic obstructive pulmonary disease) (HCC) Stable severe disease. Flares about every other month. Continued smoking clearly a contributor - discussed decreasing today, she is unready to consider quitting.  Continue spiriva and symbicort, albuterol prn, prednisone 10mg  qd She has sx daily - would avoid treating w additional steroids unless she has a true escalation of sx.   Allergic  rhinitis Continue same regimen    Follow Up Instructions: 2 months   I discussed the assessment and treatment plan with the patient. The patient was provided an opportunity to ask questions and all were answered. The patient agreed with the plan and demonstrated an understanding of the instructions.   The patient was advised to call back or seek an in-person evaluation if the symptoms worsen or if the condition fails to improve as anticipated.  I provided 21 minutes of non-face-to-face time during this encounter.   Collene Gobble, MD

## 2019-10-17 NOTE — Assessment & Plan Note (Signed)
Stable severe disease. Flares about every other month. Continued smoking clearly a contributor - discussed decreasing today, she is unready to consider quitting.  Continue spiriva and symbicort, albuterol prn, prednisone 10mg  qd She has sx daily - would avoid treating w additional steroids unless she has a true escalation of sx.

## 2019-10-24 ENCOUNTER — Telehealth: Payer: Self-pay

## 2019-10-24 NOTE — Telephone Encounter (Signed)
Diabetes readings  10/02 134 10/03 123 10/04 104 10/05 120 10/06 88 10/08 102 10/09 134 10/10 122 10/11 118 10/12 88 10/13 122 10/14 116 10/15 128 10/16 128 10/17 124 10/18 115 10/19 107 10/20 142 10/21 102 10/22 98 10/23 93 10/24 117 10/25 102

## 2019-10-24 NOTE — Telephone Encounter (Signed)
Glucose readings look good, no change to glipizide

## 2019-10-24 NOTE — Telephone Encounter (Signed)
Noted  

## 2019-10-26 ENCOUNTER — Telehealth: Payer: Self-pay | Admitting: *Deleted

## 2019-10-26 NOTE — Telephone Encounter (Signed)
Received call from patient. States that she left readings on VM but never heard back.   Advised that PCP recommended no changes. Advised to continue to monitor FSBS Q AM and to call if readings consistently above 140.  Verbalized understanding.

## 2019-10-26 NOTE — Telephone Encounter (Signed)
Agree This is most likely her chronic thrush, pt declined in office visit

## 2019-10-26 NOTE — Telephone Encounter (Signed)
Received call from patient.   Reports that she is having sore throat. Also states that she has bumps on tongue and back of throat. Had friend Louie Casa check her mouth and states that areas are white, and appear to be pus filled.   States that she is continuing to use Nystatin mouth wash and gargling with warm salt water.   Appointment scheduled for Telehealth on 10/28/2019. Offered appointment with fellow MD, but patient declined.

## 2019-10-28 ENCOUNTER — Other Ambulatory Visit: Payer: Self-pay

## 2019-10-28 ENCOUNTER — Ambulatory Visit (INDEPENDENT_AMBULATORY_CARE_PROVIDER_SITE_OTHER): Payer: Medicare Other | Admitting: Family Medicine

## 2019-10-28 ENCOUNTER — Encounter: Payer: Self-pay | Admitting: Family Medicine

## 2019-10-28 DIAGNOSIS — I2510 Atherosclerotic heart disease of native coronary artery without angina pectoris: Secondary | ICD-10-CM | POA: Diagnosis not present

## 2019-10-28 DIAGNOSIS — B372 Candidiasis of skin and nail: Secondary | ICD-10-CM | POA: Diagnosis not present

## 2019-10-28 DIAGNOSIS — E119 Type 2 diabetes mellitus without complications: Secondary | ICD-10-CM

## 2019-10-28 DIAGNOSIS — B370 Candidal stomatitis: Secondary | ICD-10-CM | POA: Diagnosis not present

## 2019-10-28 MED ORDER — GLIPIZIDE 5 MG PO TABS: 5.0000 mg | ORAL_TABLET | Freq: Two times a day (BID) | ORAL | 3 refills | Status: DC

## 2019-10-28 MED ORDER — NYSTATIN 100000 UNIT/GM EX POWD: Freq: Four times a day (QID) | CUTANEOUS | 1 refills | Status: DC

## 2019-10-28 MED ORDER — OXYCODONE-ACETAMINOPHEN 5-325 MG PO TABS: 1.0000 | ORAL_TABLET | Freq: Four times a day (QID) | ORAL | 0 refills | Status: DC | PRN

## 2019-10-28 MED ORDER — FLUCONAZOLE 100 MG PO TABS: 100.0000 mg | ORAL_TABLET | Freq: Every day | ORAL | 0 refills | Status: DC

## 2019-10-28 MED ORDER — FIRST-DUKES MOUTHWASH MT SUSP: OROMUCOSAL | 0 refills | Status: DC

## 2019-10-28 NOTE — Progress Notes (Signed)
Virtual Visit via Telephone Note  I connected with Cindy [name] on 10/28/19 at 12:50pm by telephone and verified that I am speaking with the correct person using two identifiers.    Pt location: at home   Physician location:  In office, [facility], Vic Blackbird MD     On call: patient and physician     I discussed the limitations, risks, security and privacy concerns of performing an evaluation and management service by telephone and the availability of in person appointments. I also discussed with the patient that there may be a patient responsible charge related to this service. The patient expressed understanding and agreed to proceed.   History of Present Illness:  Pt called in with sore throat, there is a yellowing in back of throat and tongue and roof of mouth. She used toothbrush and that helped, but still has some bumps on back of throat  She is using the nystatin swish and swallow doesn't feel like it working well   She has some redness, itching and bumps beneath her breast using lotrisone cream and powder, using Nivea   She has used her pain medication, for her leg and back pain, for her next refill, she wants to return back to 5-325mg  oxycodone acetaminophen, she last received in Jan.  She is using nasal spray atrovent and flonase, the flonase causes her to have sores.     Observations/Objective: NAD noted on phone, no wheeze, no cough,   Assessment and Plan: Yeast intertrigo- use nystatin powder, stop the extra lotions and creams beneath the breast   Chronic pain- will change to percocet 5-325mg  for next refill  Thrush in mouth and tongue- but nystatin  Not helping, will change to magic mouthwash as I am unable to visualize but this is a chroni recurrent problem for her, with the intertigo, will do another week of diflucan which worked in the past as well   Note pt on chronic prednisone due to COPD  DM- CBG recently have been 90-low 100's, A1C was  8.4% in Sept  - continue glipizide 5mg  BID to be refilled   no hypoglycemia symptoms    Follow Up Instructions:    I discussed the assessment and treatment plan with the patient. The patient was provided an opportunity to ask questions and all were answered. The patient agreed with the plan and demonstrated an understanding of the instructions.   The patient was advised to call back or seek an in-person evaluation if the symptoms worsen or if the condition fails to improve as anticipated.  I provided 15  minutes of non-face-to-face time during this encounter. End time 1:05pm  Vic Blackbird, MD

## 2019-11-02 ENCOUNTER — Telehealth: Payer: Self-pay | Admitting: Family Medicine

## 2019-11-02 NOTE — Telephone Encounter (Signed)
Pt called and states that she needs something called in for [facility] to deliver. Her breathing is worse then usual - she can not get up and do anything at all cause she loses her breath. She is having nasal congestion and chest congestion. She does not know if she is running a fever or not but she has been having cold chills. She has uses OTC medication for congestion and it helps some. Pt is talking fine on phone and she states that she is sitting down and she does not have issues as long as she is sitting down.

## 2019-11-02 NOTE — Telephone Encounter (Signed)
Spoke with patient.  Her breathing has been worsening feels like she cannot catch her breath at all.  She is unable to move around her home without difficulty.  She has not had any fever.  She has cough and wheeze.  She is requesting more prednisone and antibiotics.  I think that she needs to be evaluated in person she has been treated multiple times via telehealth and as an outpatient.  She continues to decline.  She then states that her abdomen is sticking out looks like she is 9 months pregnant.  We will write her to the nearest emergency room.  She agrees to go.

## 2019-11-04 ENCOUNTER — Other Ambulatory Visit: Payer: Self-pay | Admitting: Family Medicine

## 2019-11-04 ENCOUNTER — Telehealth: Payer: Self-pay

## 2019-11-04 ENCOUNTER — Other Ambulatory Visit: Payer: Self-pay

## 2019-11-04 MED ORDER — OXYCODONE-ACETAMINOPHEN 5-325 MG PO TABS: 1.0000 | ORAL_TABLET | Freq: Four times a day (QID) | ORAL | 0 refills | Status: DC | PRN

## 2019-11-04 NOTE — Telephone Encounter (Signed)
Message relayed to pt. Verbalizes understanding.

## 2019-11-04 NOTE — Telephone Encounter (Signed)
Requested Prescriptions   Pending Prescriptions Disp Refills  . oxyCODONE-acetaminophen (PERCOCET) 5-325 MG tablet 45 tablet 0    Sig: Take 1 tablet by mouth every 6 (six) hours as needed for severe pain.    Last OV 10/28/2019  Last written 10/28/2019  Rx was sent to wrong pharmacy. Pt would like it go to [facility] on [address].

## 2019-11-04 NOTE — Telephone Encounter (Signed)
If she is having difficulty breathing like we discussed, she needs to go to the ER. If they can't drive , call EMS

## 2019-11-04 NOTE — Telephone Encounter (Signed)
Pt called stating that Dr. Buelah Manis was suppose to put in an xray order at [facility]. After asking Dr. Buelah Manis, pt is suppose to go to ED. I advised pt to go to ED and she insisted on getting an xray done. I stated to the pt that Dr. Buelah Manis was not going to order an xray and to get to the ED.  2 hours later, Louie Casa called and stated that he can not get pt to ED until Monday or Tuesday 11/09-11/10 due to having car trouble. I advised him to find a way to get her back to [place] stated that he would see what he could do. Please advise.

## 2019-11-06 ENCOUNTER — Emergency Department (HOSPITAL_COMMUNITY): Payer: Medicare Other

## 2019-11-06 ENCOUNTER — Other Ambulatory Visit: Payer: Self-pay

## 2019-11-06 ENCOUNTER — Emergency Department (HOSPITAL_COMMUNITY)
Admission: EM | Admit: 2019-11-06 | Discharge: 2019-11-06 | Disposition: A | Discharge status: Home / Self Care | Payer: Medicare Other | Attending: Emergency Medicine | Admitting: Emergency Medicine

## 2019-11-06 ENCOUNTER — Encounter (HOSPITAL_COMMUNITY): Payer: Self-pay | Admitting: Emergency Medicine

## 2019-11-06 DIAGNOSIS — Z955 Presence of coronary angioplasty implant and graft: Secondary | ICD-10-CM | POA: Insufficient documentation

## 2019-11-06 DIAGNOSIS — Z20828 Contact with and (suspected) exposure to other viral communicable diseases: Secondary | ICD-10-CM | POA: Insufficient documentation

## 2019-11-06 DIAGNOSIS — F17210 Nicotine dependence, cigarettes, uncomplicated: Secondary | ICD-10-CM | POA: Diagnosis not present

## 2019-11-06 DIAGNOSIS — J441 Chronic obstructive pulmonary disease with (acute) exacerbation: Secondary | ICD-10-CM | POA: Diagnosis not present

## 2019-11-06 DIAGNOSIS — R140 Abdominal distension (gaseous): Secondary | ICD-10-CM | POA: Diagnosis not present

## 2019-11-06 DIAGNOSIS — I259 Chronic ischemic heart disease, unspecified: Secondary | ICD-10-CM | POA: Insufficient documentation

## 2019-11-06 DIAGNOSIS — I5032 Chronic diastolic (congestive) heart failure: Secondary | ICD-10-CM | POA: Diagnosis not present

## 2019-11-06 DIAGNOSIS — R0602 Shortness of breath: Secondary | ICD-10-CM | POA: Diagnosis not present

## 2019-11-06 DIAGNOSIS — I110 Hypertensive heart disease with heart failure: Secondary | ICD-10-CM | POA: Insufficient documentation

## 2019-11-06 DIAGNOSIS — E119 Type 2 diabetes mellitus without complications: Secondary | ICD-10-CM | POA: Diagnosis not present

## 2019-11-06 DIAGNOSIS — N200 Calculus of kidney: Secondary | ICD-10-CM | POA: Diagnosis not present

## 2019-11-06 LAB — CBC WITH DIFFERENTIAL/PLATELET
Abs Immature Granulocytes: 0.14 10*3/uL — ABNORMAL HIGH (ref 0.00–0.07)
Basophils Absolute: 0 10*3/uL (ref 0.0–0.1)
Basophils Relative: 0 %
Eosinophils Absolute: 0 10*3/uL (ref 0.0–0.5)
Eosinophils Relative: 0 %
HCT: 40.5 % (ref 36.0–46.0)
Hemoglobin: 12.8 g/dL (ref 12.0–15.0)
Immature Granulocytes: 1 %
Lymphocytes Relative: 9 %
Lymphs Abs: 1.7 10*3/uL (ref 0.7–4.0)
MCH: 30.4 pg (ref 26.0–34.0)
MCHC: 31.6 g/dL (ref 30.0–36.0)
MCV: 96.2 fL (ref 80.0–100.0)
Monocytes Absolute: 1 10*3/uL (ref 0.1–1.0)
Monocytes Relative: 5 %
Neutro Abs: 16.2 10*3/uL — ABNORMAL HIGH (ref 1.7–7.7)
Neutrophils Relative %: 85 %
Platelets: 297 10*3/uL (ref 150–400)
RBC: 4.21 MIL/uL (ref 3.87–5.11)
RDW: 14.3 % (ref 11.5–15.5)
WBC: 19.2 10*3/uL — ABNORMAL HIGH (ref 4.0–10.5)
nRBC: 0 % (ref 0.0–0.2)

## 2019-11-06 LAB — COMPREHENSIVE METABOLIC PANEL
ALT: 40 U/L (ref 0–44)
AST: 32 U/L (ref 15–41)
Albumin: 3.7 g/dL (ref 3.5–5.0)
Alkaline Phosphatase: 76 U/L (ref 38–126)
Anion gap: 12 (ref 5–15)
BUN: 13 mg/dL (ref 8–23)
CO2: 34 mmol/L — ABNORMAL HIGH (ref 22–32)
Calcium: 9.5 mg/dL (ref 8.9–10.3)
Chloride: 88 mmol/L — ABNORMAL LOW (ref 98–111)
Creatinine, Ser: 0.83 mg/dL (ref 0.44–1.00)
GFR calc Af Amer: >60 mL/min (ref >60)
GFR calc non Af Amer: >60 mL/min (ref >60)
Glucose, Bld: 216 mg/dL — ABNORMAL HIGH (ref 70–99)
Potassium: 3.4 mmol/L — ABNORMAL LOW (ref 3.5–5.1)
Sodium: 134 mmol/L — ABNORMAL LOW (ref 135–145)
Total Bilirubin: 0.4 mg/dL (ref 0.3–1.2)
Total Protein: 6.7 g/dL (ref 6.5–8.1)

## 2019-11-06 LAB — TROPONIN I (HIGH SENSITIVITY)
Troponin I (High Sensitivity): 6 ng/L (ref <18)
Troponin I (High Sensitivity): 6 ng/L (ref <18)

## 2019-11-06 LAB — LIPASE, BLOOD: Lipase: 35 U/L (ref 11–51)

## 2019-11-06 LAB — BRAIN NATRIURETIC PEPTIDE: B Natriuretic Peptide: 29 pg/mL (ref 0.0–100.0)

## 2019-11-06 MED ORDER — MAGNESIUM SULFATE IN D5W 1-5 GM/100ML-% IV SOLN
1.0000 g | Freq: Once | INTRAVENOUS | Status: AC
  Administered 2019-11-06: 1 g via INTRAVENOUS
  Filled 2019-11-06: qty 100

## 2019-11-06 MED ORDER — MORPHINE SULFATE (PF) 4 MG/ML IV SOLN
4.0000 mg | Freq: Once | INTRAVENOUS | Status: AC
  Administered 2019-11-06: 4 mg via INTRAVENOUS
  Filled 2019-11-06: qty 1

## 2019-11-06 MED ORDER — DOXYCYCLINE HYCLATE 100 MG PO CAPS: 100.0000 mg | ORAL_CAPSULE | Freq: Two times a day (BID) | ORAL | 0 refills | Status: AC

## 2019-11-06 MED ORDER — METHYLPREDNISOLONE SODIUM SUCC 125 MG IJ SOLR
125.0000 mg | Freq: Once | INTRAMUSCULAR | Status: AC
  Administered 2019-11-06: 125 mg via INTRAVENOUS
  Filled 2019-11-06: qty 2

## 2019-11-06 MED ORDER — PREDNISONE 20 MG PO TABS: 20.0000 mg | ORAL_TABLET | Freq: Every day | ORAL | 0 refills | Status: AC

## 2019-11-06 MED ORDER — DOXYCYCLINE HYCLATE 100 MG PO TABS
100.0000 mg | ORAL_TABLET | Freq: Once | ORAL | Status: AC
  Administered 2019-11-06: 100 mg via ORAL
  Filled 2019-11-06: qty 1

## 2019-11-06 MED ORDER — ALBUTEROL SULFATE HFA 108 (90 BASE) MCG/ACT IN AERS
6.0000 | INHALATION_SPRAY | Freq: Once | RESPIRATORY_TRACT | Status: AC
  Administered 2019-11-06: 6 via RESPIRATORY_TRACT
  Filled 2019-11-06: qty 6.7

## 2019-11-06 NOTE — ED Provider Notes (Signed)
Delta troponins without any significant change.  Both of them are 6.  Patient stable for discharge.   Fredia Sorrow, MD 11/06/19 1726

## 2019-11-06 NOTE — ED Provider Notes (Signed)
Emergency Department Provider Note   I have reviewed the triage vital signs and the nursing notes.   HISTORY  Chief Complaint Shortness of Breath and Back Pain   HPI Cindy [name] is a 63 y.o. female with past medical history of COPD, diastolic CHF, CAD presents to the emergency department with increased cough with pain in her left, posterior back.  She denies anterior chest pain but states she is having pain running from the left side of her lower back to her chest.  She states this is typical of her lower back pain but states that she feels that her COPD is flaring up again.  She has been taking her home medications and is currently on 10 mg prednisone which she takes daily.  She has not been on antibiotics in the last 30 days.  She denies abdominal pain, vomiting, diarrhea.  She has had some swelling in her lower extremities as well.  Denies fevers or chills.  No sick contacts.    Patient tells me that she had some mild swelling in her legs.  She also describes her abdomen is being somewhat distended and that her primary care doctor was concerned about possible bowel obstruction.  The symptoms have been going on for several weeks and she denies any vomiting.  She does have regular bowel movements. She is requesting abdominal x-ray.   Past Medical History:  Diagnosis Date  . 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 Arthritis   . CAD (coronary artery disease)    a. NSTEMI 07/2018 s/p difficult PCI with overlapping DES to LAD, residual OM2 disease treated medically.  . Chronic diastolic CHF (congestive heart failure) ([place])   . Chronic pain   . Chronic respiratory failure ([place]) On home 02 2-3L  . Compression fracture of lumbar vertebra ([place])   . COPD (chronic obstructive pulmonary disease) ([place])   . Essential hypertension   . Hyperglycemia, drug-induced   . Hyperlipidemia   . Kidney stones 2014   Left side, multiple  . Leukocytosis   . Myocardial infarct ([place])   . Osteoporosis 2013  .  Thyroid disease    pt denies  . Tobacco abuse     Patient Active Problem List   Diagnosis Date Noted  . Stucco keratoses 09/27/2019  . Diabetes mellitus without complication ([place]) 99991111  . Chronic pain 11/18/2018  . CAD (coronary artery disease) 10/05/2018  . Chronic diastolic CHF (congestive heart failure) ([place])   . S/P PTCA (percutaneous transluminal coronary angioplasty)   . Orthopnea   . Non-ST elevation (NSTEMI) myocardial infarction ([place])   . Essential hypertension   . Bilateral leg edema 08/11/2018  . Abnormal EKG 08/11/2018  . Diastolic dysfunction [id number]  . Pulmonary hypertension ([place]) 05/01/2016  . Leg swelling 04/15/2016  . Rash and nonspecific skin eruption 08/15/2014  . Ureteral stone with hydronephrosis 05/30/2013  . Allergic rhinitis 04/11/2013  . Kidney stones 04/11/2013  . Lumbar back pain 03/03/2013  . Compression fracture of L3 lumbar vertebra 03/03/2013  . Constipation 03/03/2013  . Sinus tachycardia 06/27/2012  . Osteopenia 03/16/2012  . GERD (gastroesophageal reflux disease) 03/16/2012  . Thrush 03/02/2012  . Chronic hypoxemic respiratory failure ([place]) 02/27/2012  . Hyperlipidemia 02/25/2012  . Tobacco abuse 09/24/2011  . COPD (chronic obstructive pulmonary disease) ([place]) 12/24/2009    Past Surgical History:  Procedure Laterality Date  . CERVICAL BIOPSY    . COLONOSCOPY N/A 03/31/2013   Procedure: COLONOSCOPY;  Surgeon: Rogene Houston, MD;  [facility]  [facility];  Service: Endoscopy;  Laterality: N/A;  730  . CORONARY ATHERECTOMY N/A 08/19/2018   Procedure: CORONARY ATHERECTOMY;  Surgeon: Nelva Bush, MD;  Location: [place];  Service: Cardiovascular;  Laterality: N/A;  . CORONARY STENT INTERVENTION N/A 08/19/2018   Procedure: CORONARY STENT INTERVENTION;  Surgeon: Nelva Bush, MD;  Location: [place] [facility];  Service: Cardiovascular;  Laterality: N/A;  . INTRAVASCULAR ULTRASOUND/IVUS N/A 08/19/2018   Procedure:  Intravascular Ultrasound/IVUS;  Surgeon: Nelva Bush, MD;  Location: [facility] [facility];  Service: Cardiovascular;  Laterality: N/A;  . LEFT HEART CATH N/A 08/19/2018   Procedure: Left Heart Cath;  Surgeon: Nelva Bush, MD;  Location: [place] [facility];  Service: Cardiovascular;  Laterality: N/A;  . LEFT HEART CATH AND CORONARY ANGIOGRAPHY N/A 08/16/2018   Procedure: LEFT HEART CATH AND CORONARY ANGIOGRAPHY;  Surgeon: Nelva Bush, MD;  Location: [facility];  Service: Cardiovascular;  Laterality: N/A;  . TUBAL LIGATION      Allergies Keflex [cephalexin], Sulfa antibiotics, Avelox [moxifloxacin hcl in nacl], and Toradol [ketorolac tromethamine]  Family History  Problem Relation Age of Onset  . Heart disease Mother   . Hypertension Sister   . Hypertension Brother     Social History Social History   Tobacco Use  . Smoking status: Current Some Day Smoker    Packs/day: 1.00    Years: 25.00    Pack years: 25.00    Types: Cigarettes  . Smokeless tobacco: Never Used  Substance Use Topics  . Alcohol use: No  . Drug use: No    Review of Systems  Constitutional: No fever/chills Eyes: No visual changes. ENT: No sore throat. Cardiovascular: Positive posterior chest pain.  Respiratory: Positive shortness of breath. Gastrointestinal: No abdominal pain.  No nausea, no vomiting.  No diarrhea.  No constipation. Genitourinary: Negative for dysuria. Musculoskeletal: Positive chronic back pain.  Skin: Negative for rash. Neurological: Negative for headaches, focal weakness or numbness.  10-point ROS otherwise negative.  ____________________________________________   PHYSICAL EXAM:  VITAL SIGNS: ED Triage Vitals  Enc Vitals Group     BP 11/06/19 1128 (!) 156/108     Pulse Rate 11/06/19 1128 (!) 101     Resp 11/06/19 1128 20     Temp 11/06/19 1128 98.7 F (37.1 C)     Temp Source 11/06/19 1128 Oral     SpO2 11/06/19 1128 99 %     Weight 11/06/19 1123 189 lb  (85.7 kg)     Height 11/06/19 1123 5\' 2"  (1.575 m)   Constitutional: Alert and oriented. Well appearing and in no acute distress. Eyes: Conjunctivae are normal.  Head: Atraumatic. Nose: No congestion/rhinnorhea. Mouth/Throat: Mucous membranes are moist. Neck: No stridor.   Cardiovascular: Tachycardia. Good peripheral circulation. Grossly normal heart sounds.   Respiratory: Increased respiratory effort.  No retractions. Lungs with end-expiratory wheezing bilaterally.  Gastrointestinal: Soft and nontender. No distention.  Musculoskeletal: No lower extremity tenderness with 1+ pitting edema in the bilateral LEs.  Neurologic:  Normal speech and language. No gross focal neurologic deficits are appreciated.  Skin:  Skin is warm, dry and intact. No rash noted.  ____________________________________________   LABS (all labs ordered are listed, but only abnormal results are displayed)  Labs Reviewed  COMPREHENSIVE METABOLIC PANEL - Abnormal; Notable for the following components:      Result Value   Sodium 134 (*)    Potassium 3.4 (*)    Chloride 88 (*)    CO2 34 (*)  Glucose, Bld 216 (*)    All other components within normal limits  CBC WITH DIFFERENTIAL/PLATELET - Abnormal; Notable for the following components:   WBC 19.2 (*)    Neutro Abs 16.2 (*)    Abs Immature Granulocytes 0.14 (*)    All other components within normal limits  SARS CORONAVIRUS 2 (TAT 6-24 HRS)  LIPASE, BLOOD  BRAIN NATRIURETIC PEPTIDE  TROPONIN I (HIGH SENSITIVITY)  TROPONIN I (HIGH SENSITIVITY)   ____________________________________________  EKG   EKG Interpretation  Date/Time:  Sunday November 06 2019 15:22:34 EST Ventricular Rate:  97 PR Interval:    QRS Duration: 101 QT Interval:  406 QTC Calculation: 516 R Axis:   82 Text Interpretation: Sinus rhythm Borderline right axis deviation Abnormal R-wave progression, early transition Prolonged QT interval No STEMI Confirmed by Nanda Quinton [id number]) on  11/06/2019 3:31:04 PM       ____________________________________________  RADIOLOGY  Dg Chest Portable 1 View  Result Date: 11/06/2019 CLINICAL DATA:  Pt reports increased SHOB and left upper and lower back pain x 1 week. Pt wears 3L o2 at home. Pt alert and oriented, reports increased swelling in abdomen and legs. Pt denies any on body Medical injector devices. EXAM: PORTABLE CHEST 1 VIEW COMPARISON:  Chest radiograph 02/25/2019 FINDINGS: Stable cardiomediastinal contours with normal heart size. Emphysema and hyperinflation. Bilateral chronic coarse interstitial opacities. No new focal pulmonary opacity. No pneumothorax or large pleural effusion. No acute finding in the visualized skeleton. IMPRESSION: Emphysema and chronic bronchitic changes.  No new acute finding. Electronically Signed   By: Audie Pinto M.D.   On: 11/06/2019 13:06   Dg Abd Portable 2 Views  Result Date: 11/06/2019 CLINICAL DATA:  Pt reports increased SHOB and left upper and lower back pain x 1 week. Pt wears 3L o2 at home. Pt alert and oriented, reports increased swelling in abdomen and legs. Pt denies any on body medical injector devices. EXAM: PORTABLE ABDOMEN - 2 VIEW COMPARISON:  Abdominal radiograph 11/30/2017 FINDINGS: There are no dilated loops of bowel to suggest obstruction. No evidence of free air. There are multiple bilateral left greater than right radiopaque densities projecting over the bilateral kidneys, consistent with renal calculi. The largest on the right measures 7 mm. Wedge compression deformities in the thoracic and lumbar spine appear similar to prior CT. IMPRESSION: 1. Bilateral renal calculi. 2. No evidence of bowel obstruction or free air. Electronically Signed   By: Audie Pinto M.D.   On: 11/06/2019 13:05    ____________________________________________   PROCEDURES  Procedure(s) performed:   Procedures  None ____________________________________________   INITIAL IMPRESSION /  ASSESSMENT AND PLAN / ED COURSE  Pertinent labs & imaging results that were available during my care of the patient were reviewed by me and considered in my medical decision making (see chart for details).   Patient presents to the emergency department for evaluation of shortness of breath.  Seems most consistent with COPD exacerbation.  Patient is on 3.5 L nasal cannula which is her baseline oxygen.  The back pain is typical of her chronic pain which she takes Percocet for.  She states she intermittently has flares of this pain which required morphine.  She has equal pulses and the quality of pain is not typical for aortic dissection.  My suspicion for aortic pathology or PE is exceedingly low.  I have also very low suspicion for ACS.  Patient has wheezing on exam.  Plan for screening chest x-ray, Solu-Medrol, albuterol, antibiotics.  Initial troponin  is unremarkable.  BNP is normal and patient does not appear acutely volume overloaded.  Leukocytosis noted with lower suspicion for acute infection.  Patient is on chronic steroids and I suspect that demargination explains this finding.   03:15 PM  On reassessment the patient is feeling improved.  I am waiting on the second troponin.  I will send COVID-19 testing. CXR and abdominal x-ray reviewed.   Care transferred to Dr. Venita Sheffield. If second troponin is low/unchanged plan for discharge with steroid burst and abx. Will test for COVID. Patient to remain in isolation and f/u with rests in [place].  ____________________________________________  FINAL CLINICAL IMPRESSION(S) / ED DIAGNOSES  Final diagnoses:  COPD exacerbation ([place])  Abdominal distension     MEDICATIONS GIVEN DURING THIS VISIT:  Medications  morphine 4 MG/ML injection 4 mg (4 mg Intravenous Given 11/06/19 1309)  albuterol (VENTOLIN HFA) 108 (90 Base) MCG/ACT inhaler 6 puff (6 puffs Inhalation Given 11/06/19 1309)  methylPREDNISolone sodium succinate (SOLU-MEDROL) 125 mg/2 mL injection  125 mg (125 mg Intravenous Given 11/06/19 1439)  doxycycline (VIBRA-TABS) tablet 100 mg (100 mg Oral Given 11/06/19 1438)  magnesium sulfate IVPB 1 g 100 mL (0 g Intravenous Stopped 11/06/19 1626)  morphine 4 MG/ML injection 4 mg (4 mg Intravenous Given 11/06/19 1645)     NEW OUTPATIENT MEDICATIONS STARTED DURING THIS VISIT:  Discharge Medication List as of 11/06/2019  5:24 PM    START taking these medications   Details  doxycycline (VIBRAMYCIN) 100 MG capsule Take 1 capsule (100 mg total) by mouth 2 (two) times daily for 7 days., Starting Sun 11/06/2019, Until Sun 11/13/2019, Print        Note:  This document was prepared using Dragon voice recognition software and may include unintentional dictation errors.  Nanda Quinton, MD, [facility] Emergency Medicine    Clair Alfieri, [facility], MD 11/06/19 [phone]

## 2019-11-06 NOTE — Discharge Instructions (Addendum)
Take the medication as prescribed make an appointment to follow-up with your doctor.  Return for any new or worse symptoms

## 2019-11-06 NOTE — ED Notes (Signed)
Pt refuses to have BP taken. She reports it causes blisters on her arm. Pt informed of the importance of BP measurement but pt continued to refuse.

## 2019-11-06 NOTE — ED Triage Notes (Signed)
Pt reports increased SHOB and left upper and lower back pain x 1 week. Pt wears 3L o2 at home. Pt alert and oriented, reports increased swelling in abdomen and legs.

## 2019-11-07 LAB — SARS CORONAVIRUS 2 (TAT 6-24 HRS): SARS Coronavirus 2: NEGATIVE

## 2019-11-09 ENCOUNTER — Other Ambulatory Visit: Payer: Self-pay | Admitting: Family Medicine

## 2019-11-09 MED ORDER — SPIRONOLACTONE 25 MG PO TABS: ORAL_TABLET | ORAL | 0 refills | Status: DC

## 2019-11-09 NOTE — Telephone Encounter (Signed)
Patient needs refill on her spironolactone  [employer] [facility]

## 2019-11-09 NOTE — Telephone Encounter (Signed)
Refill sent to [facility] 

## 2019-11-11 ENCOUNTER — Telehealth: Payer: Self-pay | Admitting: Family Medicine

## 2019-11-11 ENCOUNTER — Other Ambulatory Visit: Payer: Self-pay | Admitting: *Deleted

## 2019-11-11 MED ORDER — SPIRONOLACTONE 25 MG PO TABS: ORAL_TABLET | ORAL | 3 refills | Status: DC

## 2019-11-11 NOTE — Telephone Encounter (Signed)
Pt aware med sent to pharm and to check BS sun and tues and she can pick up her strips on Thursday

## 2019-11-11 NOTE — Telephone Encounter (Signed)
Cindy [name] came by requesting a refill on patients fanny cream called into [facility].  Prodigy blood glucose test strips she has two left. He states that when she first started they used several trying to figure out how to use it. Raymond states she can't have anymore until the 19th of November.  CB# [phone]

## 2019-11-14 ENCOUNTER — Other Ambulatory Visit: Payer: Self-pay | Admitting: Physician Assistant

## 2019-11-14 ENCOUNTER — Other Ambulatory Visit: Payer: Self-pay | Admitting: Family Medicine

## 2019-11-26 ENCOUNTER — Other Ambulatory Visit: Payer: Self-pay | Admitting: Family Medicine

## 2019-11-29 ENCOUNTER — Other Ambulatory Visit: Payer: Self-pay | Admitting: Family Medicine

## 2019-12-06 ENCOUNTER — Other Ambulatory Visit: Payer: Self-pay | Admitting: Family Medicine

## 2019-12-07 ENCOUNTER — Telehealth: Payer: Self-pay | Admitting: Family Medicine

## 2019-12-07 NOTE — Telephone Encounter (Signed)
Patient needs refill on her oral nystatin she staetes it is 240 mg and she swishes 5 mg 4x a day and she also needs her mupirocin oinment 2$ called into [place].    CB#[phone]

## 2019-12-08 MED ORDER — SPIRONOLACTONE 25 MG PO TABS: 25.0000 mg | ORAL_TABLET | Freq: Every day | ORAL | 11 refills | Status: DC

## 2019-12-08 MED ORDER — LINACLOTIDE 145 MCG PO CAPS: 145.0000 ug | ORAL_CAPSULE | Freq: Every day | ORAL | 11 refills | Status: DC

## 2019-12-08 MED ORDER — NYSTATIN 100000 UNIT/ML MT SUSP: 5.0000 mL | Freq: Four times a day (QID) | OROMUCOSAL | 3 refills | Status: DC

## 2019-12-08 NOTE — Telephone Encounter (Signed)
Mupirocin is not routinely used.   Patient will need OV if required.   Prescriptions sent to pharmacy for Nyustatin, Aldactone, and Linzess per VM left by patient.

## 2019-12-09 ENCOUNTER — Other Ambulatory Visit: Payer: Self-pay

## 2019-12-09 ENCOUNTER — Ambulatory Visit (INDEPENDENT_AMBULATORY_CARE_PROVIDER_SITE_OTHER): Payer: Medicare Other | Admitting: Emergency Medicine

## 2019-12-09 ENCOUNTER — Encounter: Payer: Self-pay | Admitting: Emergency Medicine

## 2019-12-09 DIAGNOSIS — J411 Mucopurulent chronic bronchitis: Secondary | ICD-10-CM | POA: Diagnosis not present

## 2019-12-09 NOTE — Progress Notes (Signed)
Virtual Visit via Telephone Note  I connected with ELIONA GUREVICH on 12/09/19 at  9:00 AM EST by telephone and verified that I am speaking with the correct person using two identifiers.  Location: Patient: Home Provider: Office   I discussed the limitations, risks, security and privacy concerns of performing an evaluation and management service by telephone and the availability of in person appointments. I also discussed with the patient that there may be a patient responsible charge related to this service. The patient expressed understanding and agreed to proceed.   History of Present Illness: 63 year old woman, heavy tobacco use, continues to smoke a pack a day.  She has severe COPD with a chronic bronchitis, frequent exacerbations.  Deals with chronic cough and significant sputum production.  She was in the emergency department 1 month ago with flaring symptoms.  She has hypoxemia and uses oxygen at 3 L/min.  Have managed her on prednisone 10 mg daily   Observations/Objective: She reports that she is doing fairly well. She feels quite limited, has difficulty whenever she goes out of the house. She has frequent wheeze, cough but appears to be her baseline. She was treated with 3 days pred 20mg  in the ED 11/06/2019. Sputum production > clear. Remains on spiriva and symbicort, using albuterol 2-3x a day. She is smoking 1 pk/day.   Assessment and Plan: Plan to continue pred 10 Continue same BD Discussed smoking cessation today Continue on flonase prn, atrovent NS prn  Follow Up Instructions: Follow with Dr Lamonte Sakai in 3 months or sooner if you have any problems.    I discussed the assessment and treatment plan with the patient. The patient was provided an opportunity to ask questions and all were answered. The patient agreed with the plan and demonstrated an understanding of the instructions.   The patient was advised to call back or seek an in-person evaluation if the symptoms worsen or if  the condition fails to improve as anticipated.  I provided 15 minutes of non-face-to-face time during this encounter.   Collene Gobble, MD

## 2019-12-16 ENCOUNTER — Other Ambulatory Visit: Payer: Self-pay | Admitting: Cardiovascular Disease

## 2019-12-16 ENCOUNTER — Other Ambulatory Visit: Payer: Self-pay | Admitting: Family Medicine

## 2019-12-16 DIAGNOSIS — K219 Gastro-esophageal reflux disease without esophagitis: Secondary | ICD-10-CM

## 2019-12-20 ENCOUNTER — Other Ambulatory Visit: Payer: Self-pay | Admitting: Family Medicine

## 2019-12-20 NOTE — Telephone Encounter (Signed)
Medication refused. Used only for MRSA infections.

## 2020-01-06 ENCOUNTER — Telehealth: Payer: Self-pay | Admitting: *Deleted

## 2020-01-06 MED ORDER — AMOXICILLIN-POT CLAVULANATE 875-125 MG PO TABS: 1.0000 | ORAL_TABLET | Freq: Two times a day (BID) | ORAL | 0 refills | Status: DC

## 2020-01-06 NOTE — Telephone Encounter (Signed)
Call placed to patient and patient made aware.   States that she is currently taking Centrum Silver and it has 1000IU of Vit D.  Advised that is fine.

## 2020-01-06 NOTE — Telephone Encounter (Signed)
Louie Casa states that patient would like to know if there is anything stronger than Linzess 145 mg this isn't working per patient. He also states that when patient spoke with Margreta Journey earlier she was told to start taking Vitamin D. Patient would like to know if there is a certain brand that she needs to take.  CB# [phone]

## 2020-01-06 NOTE — Telephone Encounter (Signed)
Augmentin sent to pharmacy.  She can take vitamin D at 1000 international units once a day multivitamin

## 2020-01-06 NOTE — Telephone Encounter (Signed)
Patient also states that she feels very run down and fatigued. Advised her to try MVI or Vit B to help boost her immune system/ energy. Will go over with patient again.

## 2020-01-06 NOTE — Telephone Encounter (Signed)
Received call from patient.   States that she has skin tears to R forearm. Reports that dog jumped on her and the dog's nails caused injury. Reports that skin was pushed back in (2) areas- 1x4cm, and 0.5x3cm areas.   States that torn skin was not pulled back over wound, so it is drying out and will eventually fall off. States that she has been cleaning with peroxide and placing guaze over are to dress. States that she had a hard time controlling blood when first occurred. Reports that guaze is also drying to wound bed and causing bleeding when changing dressing.   Advised to use wound care protocol as follows: Cleanse wounds with warm soapy water. Pat dry. Do not wipe. Apply thin layer of neosporin to wound bed. Cover with nonadherent guaze closest to wound, then normal guaze for draining as needed and wrap with coban QD.  Inquired as to if she required ABTX for possible cellulitis. Reports that area is red around wound and is warmer to touch than the rest of skin. States that area is painful. Denies green or purulent drainage. MD please advise.

## 2020-01-27 ENCOUNTER — Encounter: Payer: Medicare Other | Admitting: Family Medicine

## 2020-02-10 ENCOUNTER — Other Ambulatory Visit: Payer: Self-pay | Admitting: Family Medicine

## 2020-02-10 ENCOUNTER — Other Ambulatory Visit: Payer: Self-pay | Admitting: Cardiovascular Disease

## 2020-02-10 DIAGNOSIS — J9611 Chronic respiratory failure with hypoxia: Secondary | ICD-10-CM

## 2020-02-10 DIAGNOSIS — K219 Gastro-esophageal reflux disease without esophagitis: Secondary | ICD-10-CM

## 2020-02-10 DIAGNOSIS — J441 Chronic obstructive pulmonary disease with (acute) exacerbation: Secondary | ICD-10-CM

## 2020-02-13 ENCOUNTER — Telehealth: Payer: Self-pay | Admitting: Emergency Medicine

## 2020-02-13 NOTE — Telephone Encounter (Signed)
COVID-19 Vaccine Information can be found at: ShippingScam.co.uk For questions related to vaccine distribution or appointments, please email vaccine@[email or web address] .[email or web address] or call [phone].    Called and spoke with pt letting her know the above info about getting scheduled for covid vaccine. Pt verbalized understanding. Nothing further needed.

## 2020-02-20 ENCOUNTER — Telehealth: Payer: Self-pay | Admitting: Cardiovascular Disease

## 2020-02-20 ENCOUNTER — Telehealth: Payer: Self-pay | Admitting: Emergency Medicine

## 2020-02-20 MED ORDER — PREDNISONE 10 MG PO TABS: 10.0000 mg | ORAL_TABLET | Freq: Every day | ORAL | 0 refills | Status: DC

## 2020-02-20 NOTE — Telephone Encounter (Signed)

## 2020-02-20 NOTE — Telephone Encounter (Signed)
Spoke with the pt  I have refilled her prednisone x 1 only pending appt 03/15/20 Nothing further needed

## 2020-02-24 ENCOUNTER — Telehealth: Payer: Self-pay

## 2020-02-24 ENCOUNTER — Encounter: Payer: Self-pay | Admitting: Cardiovascular Disease

## 2020-02-24 ENCOUNTER — Telehealth (INDEPENDENT_AMBULATORY_CARE_PROVIDER_SITE_OTHER): Payer: Medicare Other | Admitting: Cardiovascular Disease

## 2020-02-24 VITALS — Ht 61.0 in | Wt 189.0 lb

## 2020-02-24 DIAGNOSIS — Z955 Presence of coronary angioplasty implant and graft: Secondary | ICD-10-CM

## 2020-02-24 DIAGNOSIS — I5032 Chronic diastolic (congestive) heart failure: Secondary | ICD-10-CM

## 2020-02-24 DIAGNOSIS — E785 Hyperlipidemia, unspecified: Secondary | ICD-10-CM | POA: Diagnosis not present

## 2020-02-24 DIAGNOSIS — I10 Essential (primary) hypertension: Secondary | ICD-10-CM

## 2020-02-24 DIAGNOSIS — I110 Hypertensive heart disease with heart failure: Secondary | ICD-10-CM

## 2020-02-24 DIAGNOSIS — I350 Nonrheumatic aortic (valve) stenosis: Secondary | ICD-10-CM

## 2020-02-24 DIAGNOSIS — I2510 Atherosclerotic heart disease of native coronary artery without angina pectoris: Secondary | ICD-10-CM | POA: Diagnosis not present

## 2020-02-24 DIAGNOSIS — I25118 Atherosclerotic heart disease of native coronary artery with other forms of angina pectoris: Secondary | ICD-10-CM

## 2020-02-24 MED ORDER — EZETIMIBE 10 MG PO TABS: 10.0000 mg | ORAL_TABLET | Freq: Every day | ORAL | 6 refills | Status: DC

## 2020-02-24 NOTE — Patient Instructions (Addendum)
Medication Instructions:   Begin Zetia 10mg  daily - new prescription sent to [facility] today.   Continue all other medications.    Labwork: none  Testing/Procedures:  Your physician has requested that you have an echocardiogram. Echocardiography is a painless test that uses sound waves to create images of your heart. It provides your doctor with information about the size and shape of your heart and how well your heart's chambers and valves are working. This procedure takes approximately one hour. There are no restrictions for this procedure.  Office will contact with results via phone or letter.    Follow-Up: 6 months   Any Other Special Instructions Will Be Listed Below (If Applicable).  If you need a refill on your cardiac medications before your next appointment, please call your pharmacy.

## 2020-02-24 NOTE — Progress Notes (Signed)
Virtual Visit via Telephone Note   This visit type was conducted due to national recommendations for restrictions regarding the COVID-19 Pandemic (e.g. social distancing) in an effort to limit this patient's exposure and mitigate transmission in our community.  Due to her co-morbid illnesses, this patient is at least at moderate risk for complications without adequate follow up.  This format is felt to be most appropriate for this patient at this time.  The patient did not have access to video technology/had technical difficulties with video requiring transitioning to audio format only (telephone).  All issues noted in this document were discussed and addressed.  No physical exam could be performed with this format.  Please refer to the patient's chart for her  consent to telehealth for [facility].   Date:  02/24/2020   ID:  Cindy [name], DOB 01/03/1956, MRN [id number]  Patient Location: Home Provider Location: Office  PCP:  Alycia Rossetti, MD  Cardiologist:  Kate Sable, MD  Electrophysiologist:  None   Evaluation Performed:  Follow-Up Visit  Chief Complaint:  CAD  History of Present Illness:    Cindy [name] is a 64 y.o. female with coronary artery disease, oxygen dependent COPD, hypertension, hyperlipidemia, tobacco abuse, and chronic pain.  She sustained a non-STEMI on 08/11/2018.  Echocardiogram at that time demonstrated normal left ventricular systolic function, LVEF 30%, with hypokinesis of the mid apical anteroseptal myocardium.  There was mild aortic stenosis.  Cardiac cath in 8/19 demonstrated two-vessel CAD with 90% LAD and 60 to 70% OM 2, LVEF 45 to 50%. LVEDP 25-30. She was unable to lie flat so she was diuresed and then taken back to the Cath Lab 08/19/2018 and underwent complex but successful PCI to the ostial and proximal LAD with orbital atherectomy with IVUSoverlapping stents. This was complicated by abrupt closure of the proximal LAD during  arthrectomy which resolved following stent placement. Limited echo 08/20/2018 LVEF 55 to 60% with improvement in wall motion.  She denies exertional chest pain.  She describes multiple, diffuse somatic complaints.  Chronic exertional dyspnea appears to be stable.  She repeated her medications to me at length.   Past Medical History:  Diagnosis Date  . 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 Arthritis   . CAD (coronary artery disease)    a. NSTEMI 07/2018 s/p difficult PCI with overlapping DES to LAD, residual OM2 disease treated medically.  . Chronic diastolic CHF (congestive heart failure) ([place])   . Chronic pain   . Chronic respiratory failure ([place]) On home 02 2-3L  . Compression fracture of lumbar vertebra ([place])   . COPD (chronic obstructive pulmonary disease) ([place])   . Essential hypertension   . Hyperglycemia, drug-induced   . Hyperlipidemia   . Kidney stones 2014   Left side, multiple  . Leukocytosis   . Myocardial infarct ([place])   . Osteoporosis 2013  . Thyroid disease    pt denies  . Tobacco abuse    Past Surgical History:  Procedure Laterality Date  . CERVICAL BIOPSY    . COLONOSCOPY N/A 03/31/2013   Procedure: COLONOSCOPY;  Surgeon: Rogene Houston, MD;  Location[facility];  Service: Endoscopy;  Laterality: N/A;  730  . CORONARY ATHERECTOMY N/A 08/19/2018   Procedure: CORONARY ATHERECTOMY;  Surgeon: Nelva Bush, MD;  Location: [place] CV LAB;  Service: Cardiovascular;  Laterality: N/A;  . CORONARY STENT INTERVENTION N/A 08/19/2018   Procedure: CORONARY STENT INTERVENTION;  Surgeon: Nelva Bush, MD;  Location:  [place] INVASIVE CV LAB;  Service: Cardiovascular;  Laterality: N/A;  . INTRAVASCULAR ULTRASOUND/IVUS N/A 08/19/2018   Procedure: Intravascular Ultrasound/IVUS;  Surgeon: Nelva Bush, MD;  Location: [place] CV LAB;  Service: Cardiovascular;  Laterality: N/A;  . LEFT HEART CATH N/A 08/19/2018   Procedure: Left Heart Cath;  Surgeon: Nelva Bush, MD;   Location: [place] CV LAB;  Service: Cardiovascular;  Laterality: N/A;  . LEFT HEART CATH AND CORONARY ANGIOGRAPHY N/A 08/16/2018   Procedure: LEFT HEART CATH AND CORONARY ANGIOGRAPHY;  Surgeon: Nelva Bush, MD;  Location: [place] CV LAB;  Service: Cardiovascular;  Laterality: N/A;  . TUBAL LIGATION       Current Meds  Medication Sig  . albuterol (PROVENTIL HFA;VENTOLIN HFA) 108 (90 Base) MCG/ACT inhaler Inhale 2 puffs into the lungs every 6 (six) hours as needed for wheezing or shortness of breath.  Marland Kitchen albuterol (PROVENTIL) (2.5 MG/3ML) 0.083% nebulizer solution INHALE 1 VIAL VIA NEBULIZER EVERY 4 HOURS AS NEEDED.  Marland Kitchen alendronate (FOSAMAX) 70 MG tablet TAKE 1 TAB BY MOUTH EVERY WEEK ON AN EMPTY STOMACH.  Marland Kitchen aspirin EC 81 MG tablet Take 81 mg by mouth every morning.   Marland Kitchen atorvastatin (LIPITOR) 80 MG tablet Take 1 tablet (80 mg total) by mouth every evening.  . Blood Glucose Monitoring Suppl (BLOOD GLUCOSE SYSTEM PAK) KIT Please dispense based on patient and insurance preference. Use as directed to monitor FSBS 1x daily. Dx: E11.9.  . Calcium Carb-Cholecalciferol 600-800 MG-UNIT TABS Take 1 tablet by mouth 2 (two) times daily.   . clopidogrel (PLAVIX) 75 MG tablet TAKE ONE TABLET BY MOUTH ONCE DAILY.  . clotrimazole-betamethasone (LOTRISONE) cream APPLY TO AFFECTED AREA TWICE DAILY.  . Diphenhyd-Hydrocort-Nystatin (FIRST-DUKES MOUTHWASH) SUSP , swish 53m TID prn pain x 2 weeks  . diphenhydrAMINE (BENADRYL) 12.5 MG/5ML liquid Take 6.25 mg by mouth 4 (four) times daily as needed.  . fluconazole (DIFLUCAN) 100 MG tablet Take 100 mg by mouth as needed.  . fluticasone (FLONASE) 50 MCG/ACT nasal spray INSTILL 1 SPRAY INTO EACH NOSTRIL DAILY AS NEEDED FOR ALLERGIES.  . furosemide (LASIX) 40 MG tablet ALTERNATE TAKING 2 TABLETS BY MOUTH ONE DAY, THEN 1 TABLET THE NEXT DAY. REPEAT.  .Marland KitchenglipiZIDE (GLUCOTROL) 5 MG tablet Take 1 tablet (5 mg total) by mouth 2 (two) times daily before a meal.  .  Glucose Blood (BLOOD GLUCOSE TEST STRIPS) STRP Please dispense based on patient and insurance preference. Use as directed to monitor FSBS 1x daily. Dx: E11.9.  . guaiFENesin-codeine 100-10 MG/5ML syrup Take 5 mLs by mouth every 6 (six) hours as needed.  .Marland Kitchenipratropium (ATROVENT) 0.03 % nasal spray Place 2 sprays into both nostrils every 12 (twelve) hours.  . Lancets MISC Please dispense based on patient and insurance preference. Use as directed to monitor FSBS 1x daily. Dx: E11.9.  . linaclotide (LINZESS) 145 MCG CAPS capsule Take 1 capsule (145 mcg total) by mouth daily before breakfast.  . Magnesium 250 MG TABS Take 1 tablet daily (Patient taking differently: Take 250 mg by mouth daily. )  . metoprolol tartrate (LOPRESSOR) 25 MG tablet TAKE 1/2 TABLET BY MOUTH TWICE DAILY.  . nitroGLYCERIN (NITROSTAT) 0.4 MG SL tablet Place 1 tablet (0.4 mg total) under the tongue every 5 (five) minutes as needed for chest pain. If no improvement with 3 doses call 911  . nystatin (MYCOSTATIN) 100000 UNIT/ML suspension Take 5 mLs (500,000 Units total) by mouth 4 (four) times daily.  .Marland Kitchennystatin (MYCOSTATIN/NYSTOP) powder APPLY TO THE AFFECTED  AREAS FOUR TIMES DAILY.  Marland Kitchen oxyCODONE-acetaminophen (PERCOCET) 5-325 MG tablet Take 1 tablet by mouth every 6 (six) hours as needed for severe pain.  . OXYGEN Inhale 3 L into the lungs continuous.   . pantoprazole (PROTONIX) 40 MG tablet TAKE ONE TABLET BY MOUTH TWICE A DAY  . polyethylene glycol (MIRALAX / GLYCOLAX) packet Take 17 g by mouth daily. (Patient taking differently: Take 17 g by mouth daily as needed for mild constipation or moderate constipation. )  . predniSONE (DELTASONE) 10 MG tablet Take 1 tablet (10 mg total) by mouth daily with breakfast.  . SPIRIVA RESPIMAT 2.5 MCG/ACT AERS INHALE 2 PUFFS INTO THE LUNGS ONCE DAILY.  Marland Kitchen spironolactone (ALDACTONE) 25 MG tablet Take 1 tablet (25 mg total) by mouth daily.  . SYMBICORT 160-4.5 MCG/ACT inhaler INHALE 2 PUFFS INTO THE  LUNGS TWICE DAILY.     Allergies:   Keflex [cephalexin], Sulfa antibiotics, Avelox [moxifloxacin hcl in nacl], and Toradol [ketorolac tromethamine]   Social History   Tobacco Use  . Smoking status: Current Some Day Smoker    Packs/day: 1.00    Years: 25.00    Pack years: 25.00    Types: Cigarettes  . Smokeless tobacco: Never Used  Substance Use Topics  . Alcohol use: No  . Drug use: No     Family Hx: The patient's family history includes Heart disease in her mother; Hypertension in her brother and sister.  ROS:   Please see the history of present illness.     All other systems reviewed and are negative.   Prior CV studies:   The following studies were reviewed today:  Reviewed above  Labs/Other Tests and Data Reviewed:    EKG:  No ECG reviewed.  Recent Labs: 02/28/2019: Magnesium 2.4 11/06/2019: ALT 40; B Natriuretic Peptide 29.0; BUN 13; Creatinine, Ser 0.83; Hemoglobin 12.8; Platelets 297; Potassium 3.4; Sodium 134   Recent Lipid Panel Lab Results  Component Value Date/Time   CHOL 175 09/27/2019 09:52 AM   TRIG 223 (H) 09/27/2019 09:52 AM   HDL 52 09/27/2019 09:52 AM   CHOLHDL 3.4 09/27/2019 09:52 AM   LDLCALC 92 09/27/2019 09:52 AM    Wt Readings from Last 3 Encounters:  02/24/20 189 lb (85.7 kg)  11/06/19 189 lb (85.7 kg)  09/27/19 190 lb (86.2 kg)     Objective:    Vital Signs:  Ht 5' 1"  (1.549 m)   Wt 189 lb (85.7 kg)   BMI 35.71 kg/m    VITAL SIGNS:  reviewed  ASSESSMENT & PLAN:    1.  Coronary artery disease: Symptomatically stable.   Given the complexity of her disease, I will opt to continue dual antiplatelet therapy with aspirin and Plavix.   Continue metoprolol and atorvastatin. I will add ezetimibe as LDL is not at goal.  2.  Chronic diastolic heart failure: Euvolemic on alternating doses of Lasix 40 and 80 mg. Blood pressure is normal.  3.  Hypertension: No changes.  4.  Hyperlipidemia: Lipids reviewed above and LDL not at goal.  I will add ezetimibe 10 mg daily.  5.  Aortic stenosis: Mild in severity by echocardiogram in August 2019. I will obtain a follow-up echocardiogram.     COVID-19 Education: The signs and symptoms of COVID-19 were discussed with the patient and how to seek care for testing (follow up with PCP or arrange E-visit).  The importance of social distancing was discussed today.  Time:   Today, I have spent 25 minutes with  the patient with telehealth technology discussing the above problems.     Medication Adjustments/Labs and Tests Ordered: Current medicines are reviewed at length with the patient today.  Concerns regarding medicines are outlined above.   Tests Ordered: No orders of the defined types were placed in this encounter.   Medication Changes: No orders of the defined types were placed in this encounter.   Follow Up:  Virtual Visit  in 6 month(s)  Signed, Kate Sable, MD  02/24/2020 9:43 AM    [place]

## 2020-02-24 NOTE — Telephone Encounter (Signed)
Patient will speak with Dr.Duruham about her chest x-ray and she also states she has constipation and has tried prune juice to no avail. She has echo scheduled for 03/02/20

## 2020-02-24 NOTE — Telephone Encounter (Signed)
Pt has questions about medications Pt is asking why did she have a CXR  Please call [phone]  Thanks renee

## 2020-02-24 NOTE — Addendum Note (Signed)
Addended by: Laurine Blazer on: 02/24/2020 11:06 AM   Modules accepted: Orders

## 2020-03-01 DIAGNOSIS — Z23 Encounter for immunization: Secondary | ICD-10-CM | POA: Diagnosis not present

## 2020-03-02 ENCOUNTER — Other Ambulatory Visit: Payer: Self-pay | Admitting: Family Medicine

## 2020-03-02 ENCOUNTER — Ambulatory Visit (HOSPITAL_COMMUNITY): Payer: Medicare Other

## 2020-03-06 ENCOUNTER — Other Ambulatory Visit: Payer: Medicare Other

## 2020-03-06 ENCOUNTER — Other Ambulatory Visit: Payer: Self-pay

## 2020-03-06 ENCOUNTER — Other Ambulatory Visit: Payer: Self-pay | Admitting: Family Medicine

## 2020-03-06 DIAGNOSIS — E119 Type 2 diabetes mellitus without complications: Secondary | ICD-10-CM

## 2020-03-06 DIAGNOSIS — I10 Essential (primary) hypertension: Secondary | ICD-10-CM

## 2020-03-06 DIAGNOSIS — E785 Hyperlipidemia, unspecified: Secondary | ICD-10-CM | POA: Diagnosis not present

## 2020-03-07 LAB — CBC WITH DIFFERENTIAL/PLATELET
Absolute Monocytes: 1830 cells/uL — ABNORMAL HIGH (ref 200–950)
Basophils Absolute: 128 cells/uL (ref 0–200)
Basophils Relative: 0.7 %
Eosinophils Absolute: 110 cells/uL (ref 15–500)
Eosinophils Relative: 0.6 %
HCT: 43.1 % (ref 35.0–45.0)
Hemoglobin: 13.9 g/dL (ref 11.7–15.5)
Lymphs Abs: 5819 cells/uL — ABNORMAL HIGH (ref 850–3900)
MCH: 29.8 pg (ref 27.0–33.0)
MCHC: 32.3 g/dL (ref 32.0–36.0)
MCV: 92.3 fL (ref 80.0–100.0)
MPV: 12.5 fL (ref 7.5–12.5)
Monocytes Relative: 10 %
Neutro Abs: 10413 cells/uL — ABNORMAL HIGH (ref 1500–7800)
Neutrophils Relative %: 56.9 %
Platelets: 302 10*3/uL (ref 140–400)
RBC: 4.67 10*6/uL (ref 3.80–5.10)
RDW: 12.9 % (ref 11.0–15.0)
Total Lymphocyte: 31.8 %
WBC: 18.3 10*3/uL — ABNORMAL HIGH (ref 3.8–10.8)

## 2020-03-07 LAB — LIPID PANEL
Cholesterol: 139 mg/dL (ref <200)
HDL: 40 mg/dL — ABNORMAL LOW (ref >=50)
LDL Cholesterol (Calc): 72 mg/dL (calc)
Non-HDL Cholesterol (Calc): 99 mg/dL (calc) (ref <130)
Total CHOL/HDL Ratio: 3.5 (calc) (ref <5.0)
Triglycerides: 196 mg/dL — ABNORMAL HIGH (ref <150)

## 2020-03-07 LAB — COMPREHENSIVE METABOLIC PANEL
AG Ratio: 1.6 (calc) (ref 1.0–2.5)
ALT: 26 U/L (ref 6–29)
AST: 28 U/L (ref 10–35)
Albumin: 4.1 g/dL (ref 3.6–5.1)
Alkaline phosphatase (APISO): 80 U/L (ref 37–153)
BUN/Creatinine Ratio: 10 (calc) (ref 6–22)
BUN: 10 mg/dL (ref 7–25)
CO2: 37 mmol/L — ABNORMAL HIGH (ref 20–32)
Calcium: 10.2 mg/dL (ref 8.6–10.4)
Chloride: 89 mmol/L — ABNORMAL LOW (ref 98–110)
Creat: 1 mg/dL — ABNORMAL HIGH (ref 0.50–0.99)
Globulin: 2.5 g/dL (calc) (ref 1.9–3.7)
Glucose, Bld: 110 mg/dL — ABNORMAL HIGH (ref 65–99)
Potassium: 3.5 mmol/L (ref 3.5–5.3)
Sodium: 138 mmol/L (ref 135–146)
Total Bilirubin: 0.5 mg/dL (ref 0.2–1.2)
Total Protein: 6.6 g/dL (ref 6.1–8.1)

## 2020-03-07 LAB — HEMOGLOBIN A1C
Hgb A1c MFr Bld: 7.4 % of total Hgb — ABNORMAL HIGH (ref <5.7)
Mean Plasma Glucose: 166 (calc)
eAG (mmol/L): 9.2 (calc)

## 2020-03-08 ENCOUNTER — Other Ambulatory Visit: Payer: Self-pay

## 2020-03-08 ENCOUNTER — Ambulatory Visit (HOSPITAL_COMMUNITY)
Admission: RE | Admit: 2020-03-08 | Discharge: 2020-03-08 | Disposition: A | Discharge status: Home / Self Care | Payer: Medicare Other | Source: Ambulatory Visit | Attending: Cardiovascular Disease | Admitting: Cardiovascular Disease

## 2020-03-08 ENCOUNTER — Telehealth: Payer: Self-pay | Admitting: Cardiovascular Disease

## 2020-03-08 DIAGNOSIS — I350 Nonrheumatic aortic (valve) stenosis: Secondary | ICD-10-CM

## 2020-03-08 NOTE — Telephone Encounter (Signed)
Pt states that she feels she needs adjustment in lopressor 12.5 mg. Pt reports that she had Echo today and BP was 149/91. Ask pt to record BP daily. Pt stated that does not have a monitor. Please advise.

## 2020-03-08 NOTE — Progress Notes (Signed)
*  PRELIMINARY RESULTS* Echocardiogram 2D Echocardiogram has been performed.  Cindy [name] 03/08/2020, 12:26 PM

## 2020-03-08 NOTE — Telephone Encounter (Signed)
BP was 149/91- wants to know if she should change her medication

## 2020-03-09 ENCOUNTER — Telehealth: Payer: Self-pay | Admitting: Licensed Clinical Social Worker

## 2020-03-09 NOTE — Telephone Encounter (Signed)
Please look into whether or not we can provide her with a monitor via our patient resources program.

## 2020-03-09 NOTE — Telephone Encounter (Signed)
CSW referred to assist patient with obtaining a BP cuff. CSW contacted patient to inform cuff will be delivered to home. Patient grateful for support and assistance. CSW available as needed. Jackie Brittainy Bucker, LCSW, CCSW-MCS [phone]  

## 2020-03-09 NOTE — Telephone Encounter (Signed)
BP cuff has been requested for pt and pt notified. Pt states that she has been taking Claritin D. Pt informed to only take regular Claritin. Pt voiced understanding.

## 2020-03-12 ENCOUNTER — Ambulatory Visit (INDEPENDENT_AMBULATORY_CARE_PROVIDER_SITE_OTHER): Payer: Medicare Other | Admitting: Family Medicine

## 2020-03-12 ENCOUNTER — Encounter: Payer: Self-pay | Admitting: Family Medicine

## 2020-03-12 ENCOUNTER — Other Ambulatory Visit: Payer: Self-pay

## 2020-03-12 VITALS — BP 142/78 | HR 104 | Temp 98.4°F | Resp 22 | Ht 61.0 in | Wt 192.0 lb

## 2020-03-12 DIAGNOSIS — J9611 Chronic respiratory failure with hypoxia: Secondary | ICD-10-CM | POA: Diagnosis not present

## 2020-03-12 DIAGNOSIS — E119 Type 2 diabetes mellitus without complications: Secondary | ICD-10-CM | POA: Diagnosis not present

## 2020-03-12 DIAGNOSIS — J411 Mucopurulent chronic bronchitis: Secondary | ICD-10-CM | POA: Diagnosis not present

## 2020-03-12 DIAGNOSIS — I5032 Chronic diastolic (congestive) heart failure: Secondary | ICD-10-CM | POA: Diagnosis not present

## 2020-03-12 DIAGNOSIS — I2510 Atherosclerotic heart disease of native coronary artery without angina pectoris: Secondary | ICD-10-CM

## 2020-03-12 MED ORDER — GLIPIZIDE 5 MG PO TABS: ORAL_TABLET | ORAL | 1 refills | Status: DC

## 2020-03-12 MED ORDER — FUROSEMIDE 40 MG PO TABS: ORAL_TABLET | ORAL | 1 refills | Status: DC

## 2020-03-12 MED ORDER — METOPROLOL TARTRATE 25 MG PO TABS: 25.0000 mg | ORAL_TABLET | Freq: Two times a day (BID) | ORAL | 1 refills | Status: DC

## 2020-03-12 NOTE — Progress Notes (Signed)
Subjective:    Patient ID: Cindy [name], female    DOB: 02-Mar-1956, 64 y.o.   MRN: [id number]  Patient presents for Follow-up (has had labs)  Patient here to follow-up recent labs.  She initially declined coming to the office but decided to come in for an office visit today.  She has had telehealth with both her pulmonologist and her cardiologist recently.  Diabetes mellitus her last A1c was 8.4% she was started on glipizide 5 mg twice a day.  She states that her fasting sugars are between 80 and 120.  She has not had any hypoglycemia symptoms.  Her A1c returned at 7.4%.  She is still on prednisone 10 mg daily  End-stage COPD oxygen dependent she is pretty much exacerbated all of the different medications for her COPD.  She is oxygen dependent.  She struggles to breathe most of the day.  She has follow-up with her pulmonologist this week.  She did request a prednisone shot she feels like she gets benefit out of this for at least a few days.  Still using Mucinex  Hypertension her blood pressure has been elevated the upper 140s over 90s.  Her heart rate tends to go up when she is moving around because of her COPD.  She called her cardiologist they were considering increasing her metoprolol but she is also been taking Claritin-D.  Advised not to take any decongestants.  She states even when she stopped that her blood pressure has been high  Diastolic heart failure she has only been taking Lasix 40 mg with spironolactone 25 mg most days   Labs reviewed in detail at bedside  Review Of Systems:  GEN- denies fatigue, fever, weight loss,weakness, recent illness HEENT- denies eye drainage, change in vision, nasal discharge, CVS- denies chest pain, palpitations RESP- +SOB, cough, wheeze ABD- denies N/V, change in stools, abd pain GU- denies dysuria, hematuria, dribbling, incontinence MSK- denies joint pain, muscle aches, injury Neuro- denies headache, dizziness, syncope, seizure activity      Objective:    BP (!) 142/78   Pulse (!) 104   Temp 98.4 F (36.9 C) (Temporal)   Resp (!) 22   Ht 5\' 1"  (1.549 m)   Wt 192 lb (87.1 kg)   SpO2 94% Comment: 3L/min via [place]  BMI 36.28 kg/m  GEN- NAD, alert and oriented x3 HEENT- PERRL, EOMI, non injected sclera, pink conjunctiva, Neck- Supple, no thyromegaly CVS- RR , mild tachycardia  HR 100  no murmur - distant HS RESP-scattered wheeze, mild  WOB ( Grunts - baseline ) at rest on 3L - mostly due to talking  ,No Rales  ABD-NABS,soft,NT,ND EXT- Trace edema  Pulses- Radial, DP- 2+      Assessment & Plan:      Problem List Items Addressed This Visit      Unprioritized   CAD (coronary artery disease) - Primary    Recently added Zetia to her statin drug.  LDL is at goal.      Relevant Medications   metoprolol tartrate (LOPRESSOR) 25 MG tablet   furosemide (LASIX) 40 MG tablet   Chronic diastolic CHF (congestive heart failure) ([place])    Discussed with patient use of diuretics.  She has been to take as prescribed by cardiology alternating 80 mg and 40 mg of Lasix continue spironolactone.  Potassium and renal function look good.      Relevant Medications   metoprolol tartrate (LOPRESSOR) 25 MG tablet   furosemide (LASIX) 40 MG tablet  Chronic hypoxemic respiratory failure (HCC)   COPD (chronic obstructive pulmonary disease) ([place])    Follow-up with pulmonology this week.  I did give her Depo-Medrol 40 mg.  She will continue prednisone 10 mg daily for now as well as her other inhalers and nebs      Diabetes mellitus without complication (HCC)    Continue glipizide 5 mg twice a day.      Relevant Medications   glipiZIDE (GLUCOTROL) 5 MG tablet      Note: This dictation was prepared with Dragon dictation along with smaller phrase technology. Any transcriptional errors that result from this process are unintentional.

## 2020-03-12 NOTE — Patient Instructions (Addendum)
Increase the metoprolol to 25mg  twice a day - for your blood pressure  Lasix refilled  Steroid shot given Cut out the fried foods  F/U 3 months

## 2020-03-12 NOTE — Assessment & Plan Note (Signed)
Recently added Zetia to her statin drug.  LDL is at goal.

## 2020-03-12 NOTE — Assessment & Plan Note (Signed)
Continue glipizide 5 mg twice a day.

## 2020-03-12 NOTE — Assessment & Plan Note (Signed)
Follow-up with pulmonology this week.  I did give her Depo-Medrol 40 mg.  She will continue prednisone 10 mg daily for now as well as her other inhalers and nebs

## 2020-03-12 NOTE — Assessment & Plan Note (Signed)
Discussed with patient use of diuretics.  She has been to take as prescribed by cardiology alternating 80 mg and 40 mg of Lasix continue spironolactone.  Potassium and renal function look good.

## 2020-03-13 ENCOUNTER — Ambulatory Visit: Payer: Medicare Other | Admitting: Family Medicine

## 2020-03-15 ENCOUNTER — Other Ambulatory Visit: Payer: Self-pay

## 2020-03-15 ENCOUNTER — Encounter: Payer: Self-pay | Admitting: Emergency Medicine

## 2020-03-15 ENCOUNTER — Ambulatory Visit (INDEPENDENT_AMBULATORY_CARE_PROVIDER_SITE_OTHER): Payer: Medicare Other | Admitting: Emergency Medicine

## 2020-03-15 ENCOUNTER — Ambulatory Visit: Payer: Medicare Other | Admitting: Emergency Medicine

## 2020-03-15 DIAGNOSIS — J9611 Chronic respiratory failure with hypoxia: Secondary | ICD-10-CM | POA: Diagnosis not present

## 2020-03-15 DIAGNOSIS — K219 Gastro-esophageal reflux disease without esophagitis: Secondary | ICD-10-CM

## 2020-03-15 DIAGNOSIS — Z720 Tobacco use: Secondary | ICD-10-CM | POA: Diagnosis not present

## 2020-03-15 DIAGNOSIS — J309 Allergic rhinitis, unspecified: Secondary | ICD-10-CM | POA: Diagnosis not present

## 2020-03-15 MED ORDER — PREDNISONE 10 MG PO TABS: 10.0000 mg | ORAL_TABLET | Freq: Every day | ORAL | 4 refills | Status: DC

## 2020-03-15 NOTE — Assessment & Plan Note (Signed)
Good compliance with her oxygen.  She knows not to smoke with her oxygen nearby or running.  3 L/min at all times

## 2020-03-15 NOTE — Assessment & Plan Note (Signed)
Continues to smoke 1 pack a day.  She is in the precontemplative phase.  We have talked about cutting down.  Not much change in her usage over the last few years.  We will continue to discuss.

## 2020-03-15 NOTE — Assessment & Plan Note (Signed)
Continue same regimen 

## 2020-03-15 NOTE — Assessment & Plan Note (Signed)
Severe COPD with continued tobacco use.  She received Depo-Medrol x1 on 3/15.  Her symptoms may have related as much to her blood pressure, cardiac issues for which her medications are being adjusted.  She did improve some with regard to congestion and cough with the Depo-Medrol.  She remains on maintenance prednisone 10 mg.  This is the first extra steroid dosing that she is required since November which is probably an improvement.  She is tolerating Spiriva and Symbicort, plan to continue these.  She uses albuterol about once a day.  She is reliable with her oxygen.  She has had the first of her Covid vaccines, is scheduled for her second.  Plan to continue her same regimen, refill her prednisone, follow her in 3 months or sooner if she has trouble

## 2020-03-15 NOTE — Progress Notes (Signed)
Virtual Visit via Telephone Note  I connected with Cindy [name] on 03/15/20 at 10:45 AM EDT by telephone and verified that I am speaking with the correct person using two identifiers.  Location: Patient: Home Provider: Office   I discussed the limitations, risks, security and privacy concerns of performing an evaluation and management service by telephone and the availability of in person appointments. I also discussed with the patient that there may be a patient responsible charge related to this service. The patient expressed understanding and agreed to proceed.   History of Present Illness: 64 year old woman, continued smoker with severe COPD and a chronic bronchitic phenotype, frequent exacerbations.  She has associated hypoxemic respiratory failure and is on 3 L/min.  We have been managing her on chronic prednisone 10 mg daily, Spiriva, Symbicort.  She is using albuterol approximately 1x a day.    Observations/Objective: She is smoking about 1 pk/day.  She has followed up with cardiology since last time - an echocardiogram 3/11 showed intact LV fxn with some mild AS and calcification. Her metoprolol is being increased, BP being monitored.  She reports that she received a depo-medrol injection on 3/15, her first since 10/2019. She was having low energy, nasal congestion, some increased SOB, increased cough.   Assessment and Plan: COPD (chronic obstructive pulmonary disease) ([place]) Severe COPD with continued tobacco use.  She received Depo-Medrol x1 on 3/15.  Her symptoms may have related as much to her blood pressure, cardiac issues for which her medications are being adjusted.  She did improve some with regard to congestion and cough with the Depo-Medrol.  She remains on maintenance prednisone 10 mg.  This is the first extra steroid dosing that she is required since November which is probably an improvement.  She is tolerating Spiriva and Symbicort, plan to continue these.  She uses  albuterol about once a day.  She is reliable with her oxygen.  She has had the first of her Covid vaccines, is scheduled for her second.  Plan to continue her same regimen, refill her prednisone, follow her in 3 months or sooner if she has trouble  Chronic hypoxemic respiratory failure (HCC) Good compliance with her oxygen.  She knows not to smoke with her oxygen nearby or running.  3 L/min at all times  Allergic rhinitis Continue same regimen  GERD (gastroesophageal reflux disease) Continue same regimen  Tobacco abuse Continues to smoke 1 pack a day.  She is in the precontemplative phase.  We have talked about cutting down.  Not much change in her usage over the last few years.  We will continue to discuss.   Follow Up Instructions: 3 months or prn.    I discussed the assessment and treatment plan with the patient. The patient was provided an opportunity to ask questions and all were answered. The patient agreed with the plan and demonstrated an understanding of the instructions.   The patient was advised to call back or seek an in-person evaluation if the symptoms worsen or if the condition fails to improve as anticipated.  I provided 21 minutes of non-face-to-face time during this encounter.   Baltazar Apo, MD, PhD 03/15/2020, 11:07 AM [facility] [phone] or if no answer [phone]

## 2020-03-16 ENCOUNTER — Telehealth: Payer: Self-pay | Admitting: *Deleted

## 2020-03-16 NOTE — Telephone Encounter (Signed)
Pt husband in office to notify office that PCP increased Lopressor to 25 mg two times daily and and of current BP's.  3/16 152/80 91 3/17 146/90 84 3/18 139/82 92 3/19 149/89 84

## 2020-03-28 ENCOUNTER — Other Ambulatory Visit: Payer: Self-pay | Admitting: Family Medicine

## 2020-04-06 DIAGNOSIS — Z23 Encounter for immunization: Secondary | ICD-10-CM | POA: Diagnosis not present

## 2020-04-09 ENCOUNTER — Telehealth: Payer: Self-pay

## 2020-04-09 NOTE — Telephone Encounter (Signed)
Pt called to report that the "Cindy [name]" cream that was prescribed is making rash worst. Pt states she does not want to come into office because Dr. Buelah Manis has already looked at it. Pt would like to know what to do. Please advise.

## 2020-04-09 NOTE — Telephone Encounter (Signed)
  Please call pt back and verify what cream she is using  She had lotrisone and nystatin powder   Where is rash, in the past had beneath breast, groin, buttocks  What does rash look like,

## 2020-04-09 NOTE — Telephone Encounter (Signed)
Call placed to patient and patient made aware.   Appointment scheduled.  

## 2020-04-09 NOTE — Telephone Encounter (Signed)
   I would like to see the rash, she can upload a picture or schedule OV   For now she can use topical steroid if she has at home or the lotrisone cream since this has steroid in it, it will help the inflammation/itch

## 2020-04-09 NOTE — Telephone Encounter (Signed)
Call placed to patient to inquire. Reports that irritation in on L buttocks, about 3-4" circumference. States that area is red and has small bump like blisters under surface. Reports that area is itchy and burns at times.   Reports that she is washing area with warm soapy water, patting dry, then cleansing with wither Peroxide or rubbing alcohol. Reports that she is using Fanny Cream from [employer] (Boric Acid 10%, Zinc Oxide, Eucerin, and Vaseline compound). States that she is covering with dry dressing after cream.   States that she will come in if needed.

## 2020-04-11 ENCOUNTER — Ambulatory Visit (INDEPENDENT_AMBULATORY_CARE_PROVIDER_SITE_OTHER): Payer: Medicare Other | Admitting: Family Medicine

## 2020-04-11 ENCOUNTER — Other Ambulatory Visit: Payer: Self-pay

## 2020-04-11 ENCOUNTER — Encounter: Payer: Self-pay | Admitting: Family Medicine

## 2020-04-11 VITALS — BP 138/74 | HR 90 | Temp 97.5°F | Resp 18 | Ht 61.0 in | Wt 183.0 lb

## 2020-04-11 DIAGNOSIS — R21 Rash and other nonspecific skin eruption: Secondary | ICD-10-CM | POA: Diagnosis not present

## 2020-04-11 DIAGNOSIS — I2510 Atherosclerotic heart disease of native coronary artery without angina pectoris: Secondary | ICD-10-CM | POA: Diagnosis not present

## 2020-04-11 DIAGNOSIS — J411 Mucopurulent chronic bronchitis: Secondary | ICD-10-CM | POA: Diagnosis not present

## 2020-04-11 MED ORDER — DOXYCYCLINE HYCLATE 100 MG PO TABS: 100.0000 mg | ORAL_TABLET | Freq: Two times a day (BID) | ORAL | 0 refills | Status: DC

## 2020-04-11 NOTE — Progress Notes (Signed)
   Subjective:    Patient ID: Cindy [name], female    DOB: 1956-02-24, 64 y.o.   MRN: [id number]  Patient presents for Rash (irritation to buttocks) Patient here with rash to her left buttocks.  She has had intermittent rash on her buttocks she sits most of the day.  She is not using any cream which is not helping.  A couple days ago it became more red and there was some pustular lesions states her caregiver.  Started using triple antibiotic ointment and is actually much improved today.  The pain is also improved. Not had any fever.  ED she had a visit with her pulmonologist via telehealth she is maintained on prednisone 10 mg for her end-stage lung disease.  She is also on oxygen 24 hours.  She does continue to smoke.  There is no changes made in her inhalers.  She states that she feels more congested like her medications and feel like she may need antibiotics.    Review Of Systems:  GEN- denies fatigue, fever, weight loss,weakness, recent illness HEENT- denies eye drainage, change in vision, nasal discharge, CVS- denies chest pain, palpitations RESP- +SOB, +cough, wheeze ABD- denies N/V, change in stools, abd pain GU- denies dysuria, hematuria, dribbling, incontinence MSK- + joint pain, muscle aches, injury Neuro- denies headache, dizziness, syncope, seizure activity       Objective:    BP 138/74   Pulse 90   Temp (!) 97.5 F (36.4 C) (Temporal)   Resp 18   Ht 5\' 1"  (1.549 m)   Wt 183 lb (83 kg)   SpO2 92% Comment: 3L/min via [place]  BMI 34.58 kg/m  GEN- NAD, alert and oriented x3 HEENT- PERRL, EOMI, non injected sclera, pink conjunctiva, Neck- Supple, no thyromegaly CVS- RRR  no murmur - distant HS RESP-few scattered wheeze, mild  WOB ( Grunts - baseline ) at rest on 3L - mostly due to talking  ,No Rales  Skin- left buttocks, mild erythema with 3 small pustular lesions, discrete abscess palpated, healed scabs 1-2 cm below rash  EXT- Trace edema  Pulses- Radial, 2+       Assessment & Plan:      Problem List Items Addressed This Visit      Unprioritized   COPD (chronic obstructive pulmonary disease) ([place])    Appears to be at her baseline currently.  I reviewed the last pulmonary note.  She does get some sinus issues during this time some ago had to give her doxycycline to have on hand in case she does get true sinusitis symptoms for she does sign her to a full COPD flare.          Other Visit Diagnoses    Skin rash    -  Primary   Seems to have bacterial superinfection.  She does sit all day which causes some irritation on her buttocks.  Attempted culture of the tiny pustules Since the rash is already significantly improved with topical medication we will continue topical antibiotic ointment with nonstick bandage this was applied today.   Relevant Orders   WOUND CULTURE      Note: This dictation was prepared with Dragon dictation along with smaller phrase technology. Any transcriptional errors that result from this process are unintentional.

## 2020-04-11 NOTE — Assessment & Plan Note (Signed)
Appears to be at her baseline currently.  I reviewed the last pulmonary note.  She does get some sinus issues during this time some ago had to give her doxycycline to have on hand in case she does get true sinusitis symptoms for she does sign her to a full COPD flare.

## 2020-04-11 NOTE — Patient Instructions (Signed)
F/U as previous 

## 2020-04-14 LAB — WOUND CULTURE
MICRO NUMBER:: 10362926
SPECIMEN QUALITY:: ADEQUATE

## 2020-04-16 ENCOUNTER — Other Ambulatory Visit: Payer: Self-pay | Admitting: *Deleted

## 2020-04-16 MED ORDER — NYSTATIN 100000 UNIT/ML MT SUSP: 5.0000 mL | Freq: Four times a day (QID) | OROMUCOSAL | 3 refills | Status: DC

## 2020-04-16 MED ORDER — SPIRONOLACTONE 25 MG PO TABS: 25.0000 mg | ORAL_TABLET | Freq: Every day | ORAL | 11 refills | Status: DC

## 2020-04-24 ENCOUNTER — Other Ambulatory Visit: Payer: Self-pay | Admitting: Family Medicine

## 2020-04-24 ENCOUNTER — Other Ambulatory Visit: Payer: Self-pay | Admitting: Cardiovascular Disease

## 2020-05-16 ENCOUNTER — Other Ambulatory Visit: Payer: Self-pay | Admitting: Family Medicine

## 2020-05-17 ENCOUNTER — Other Ambulatory Visit: Payer: Self-pay | Admitting: *Deleted

## 2020-05-17 ENCOUNTER — Other Ambulatory Visit: Payer: Self-pay | Admitting: Family Medicine

## 2020-05-17 MED ORDER — ALBUTEROL SULFATE HFA 108 (90 BASE) MCG/ACT IN AERS: INHALATION_SPRAY | RESPIRATORY_TRACT | 11 refills | Status: DC

## 2020-06-03 IMAGING — US US EXTREM LOW VENOUS*R*
1 series · 13 of 24 positions shown · non-contrast
Comparison: None.

CLINICAL DATA: Right lower extremity pain and edema since this past
[REDACTED]. Evaluate for DVT.



[Series 1: us extrem low venous*right* · 0.12mm/px · 13 of 51 slices shown]
[im 1/51]
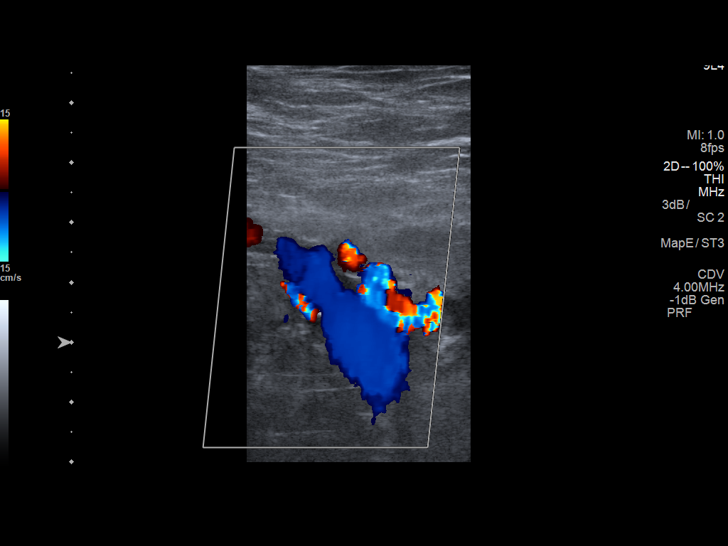
[im 5/51]
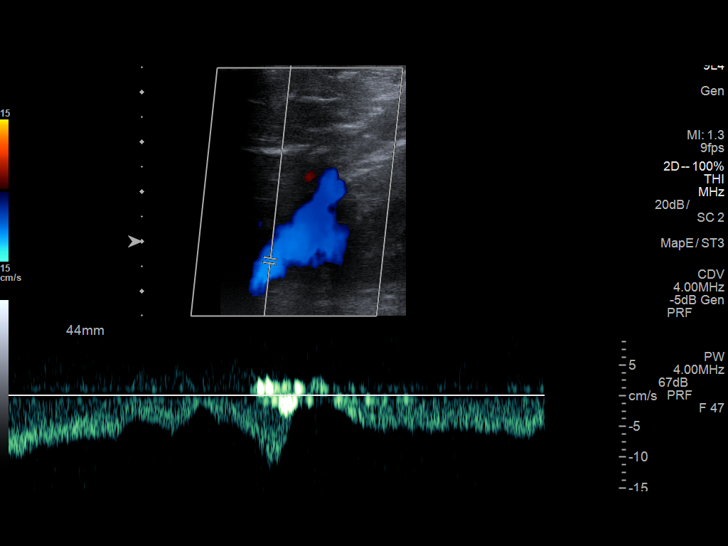
[im 9/51]
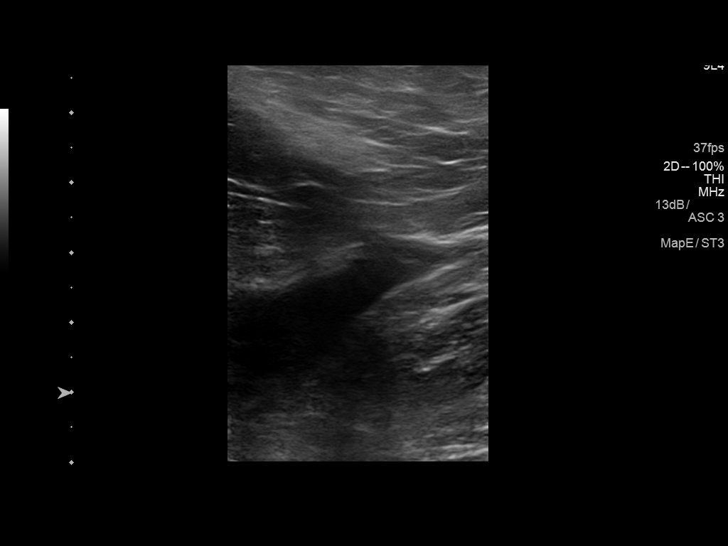
[im 14/51]
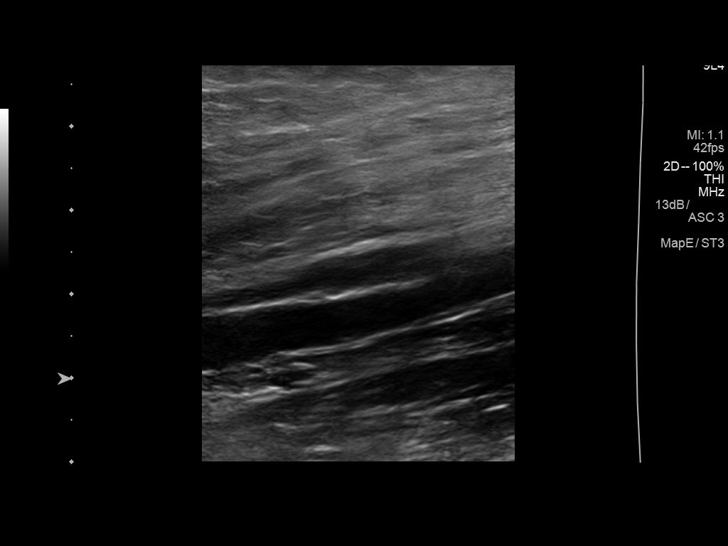
[im 18/51]
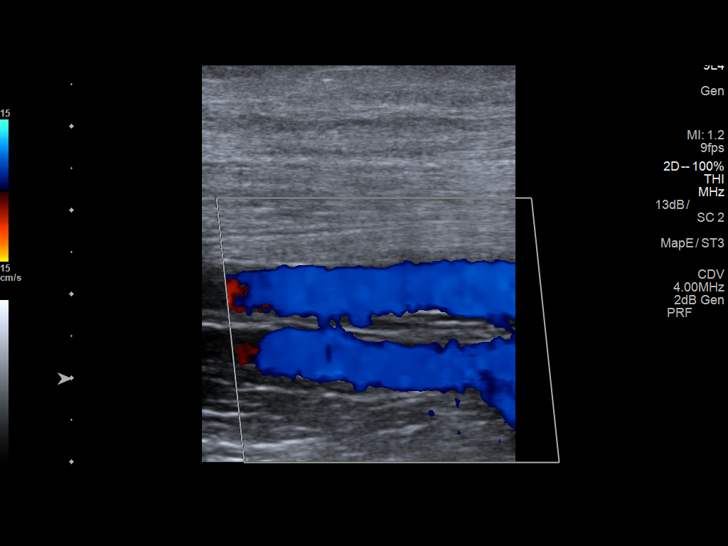
[im 22/51]
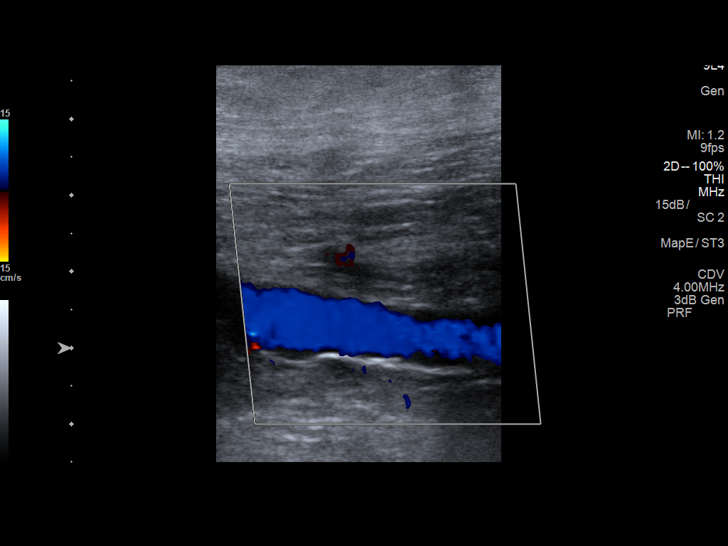
[im 27/51]
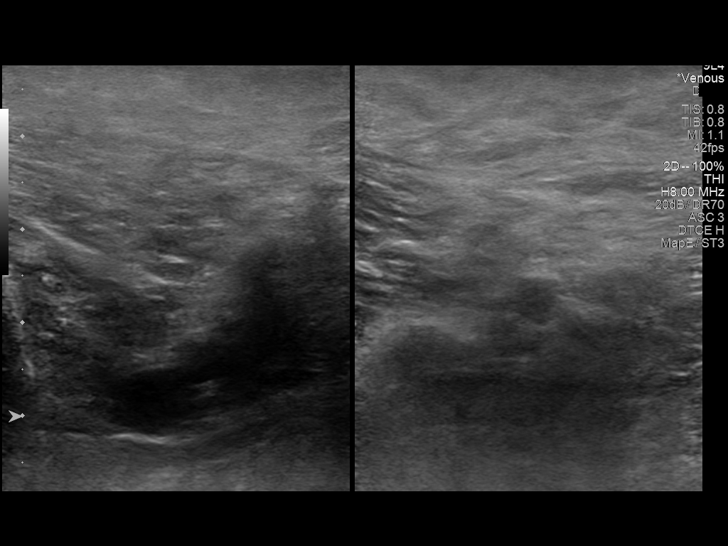
[im 29/51]
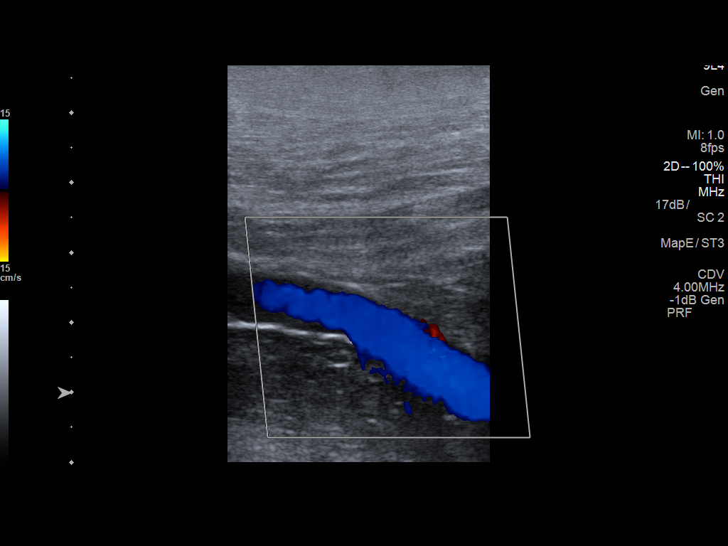
[im 33/51]
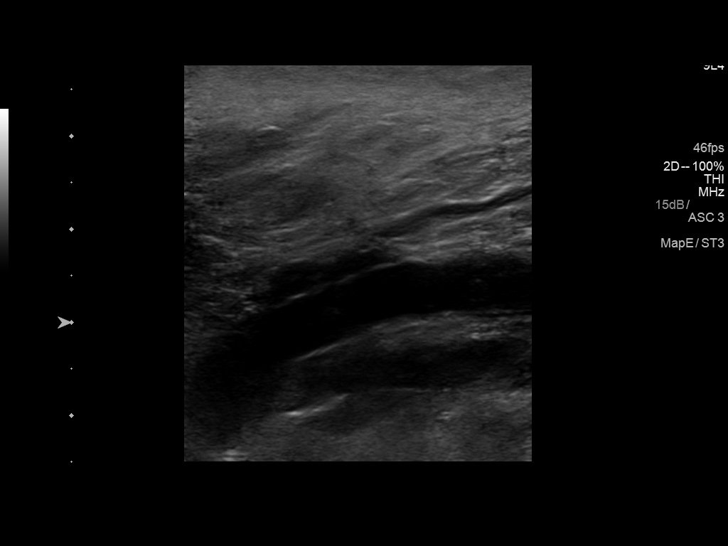
[im 37/51]
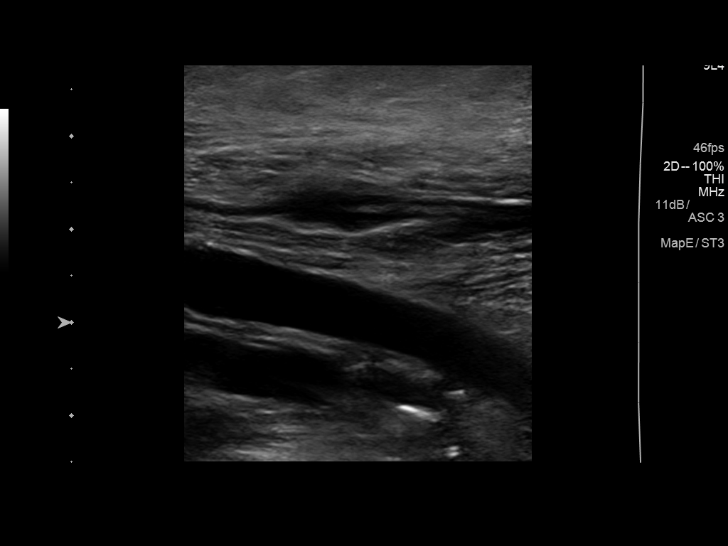
[im 42/51]
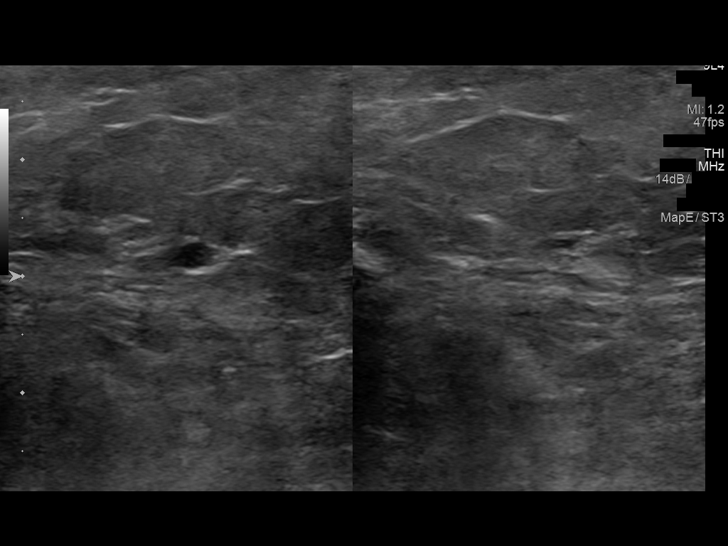
[im 46/51]
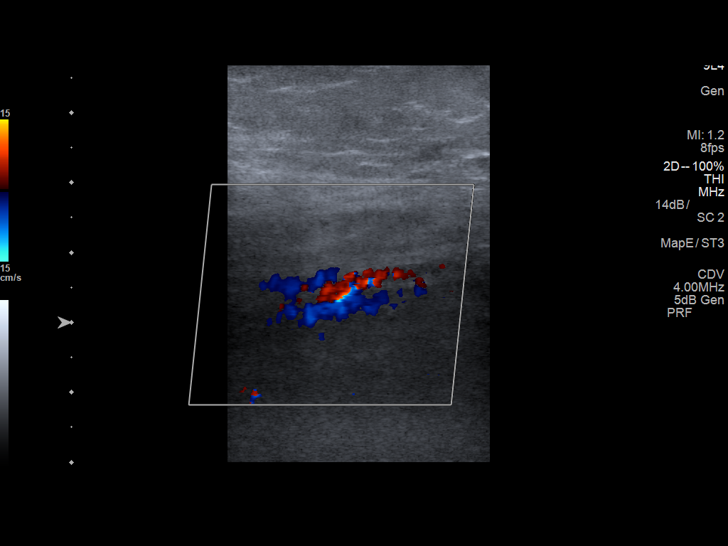
[im 51/51]
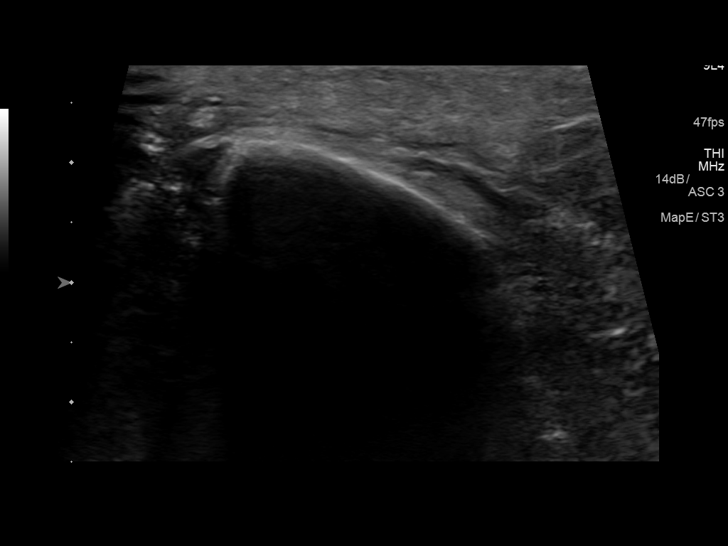

[13 of 24 positions shown; findings below may reference images not displayed]

FINDINGS: Contralateral Common Femoral Vein: Respiratory phasicity is normal
and symmetric with the symptomatic side. No evidence of thrombus.
Normal compressibility.

Common Femoral Vein: No evidence of thrombus. Normal
compressibility, respiratory phasicity and response to augmentation.

Saphenofemoral Junction: No evidence of thrombus. Normal
compressibility and flow on color Doppler imaging.

Profunda Femoral Vein: No evidence of thrombus. Normal
compressibility and flow on color Doppler imaging.

Femoral Vein: No evidence of thrombus. Normal compressibility,
respiratory phasicity and response to augmentation.

Popliteal Vein: No evidence of thrombus. Normal compressibility,
respiratory phasicity and response to augmentation.

Calf Veins: No evidence of thrombus. Normal compressibility and flow
on color Doppler imaging.

Superficial Great Saphenous Vein: No evidence of thrombus. Normal
compressibility.

Venous Reflux:  None.

Other Findings:  None.
IMPRESSION: No evidence of DVT within the right lower extremity.

## 2020-06-11 ENCOUNTER — Other Ambulatory Visit: Payer: Self-pay | Admitting: Family Medicine

## 2020-06-15 ENCOUNTER — Encounter: Payer: Self-pay | Admitting: Family Medicine

## 2020-06-15 ENCOUNTER — Ambulatory Visit (INDEPENDENT_AMBULATORY_CARE_PROVIDER_SITE_OTHER): Payer: Medicare Other | Admitting: Family Medicine

## 2020-06-15 ENCOUNTER — Other Ambulatory Visit: Payer: Self-pay | Admitting: Family Medicine

## 2020-06-15 VITALS — BP 130/78 | HR 112 | Temp 98.1°F | Resp 16 | Ht 61.0 in | Wt 182.0 lb

## 2020-06-15 DIAGNOSIS — I5032 Chronic diastolic (congestive) heart failure: Secondary | ICD-10-CM | POA: Diagnosis not present

## 2020-06-15 DIAGNOSIS — K219 Gastro-esophageal reflux disease without esophagitis: Secondary | ICD-10-CM

## 2020-06-15 DIAGNOSIS — E119 Type 2 diabetes mellitus without complications: Secondary | ICD-10-CM

## 2020-06-15 DIAGNOSIS — I272 Pulmonary hypertension, unspecified: Secondary | ICD-10-CM

## 2020-06-15 DIAGNOSIS — J9611 Chronic respiratory failure with hypoxia: Secondary | ICD-10-CM

## 2020-06-15 DIAGNOSIS — J441 Chronic obstructive pulmonary disease with (acute) exacerbation: Secondary | ICD-10-CM

## 2020-06-15 DIAGNOSIS — I2510 Atherosclerotic heart disease of native coronary artery without angina pectoris: Secondary | ICD-10-CM | POA: Diagnosis not present

## 2020-06-15 DIAGNOSIS — I10 Essential (primary) hypertension: Secondary | ICD-10-CM

## 2020-06-15 DIAGNOSIS — M8080XD Other osteoporosis with current pathological fracture, unspecified site, subsequent encounter for fracture with routine healing: Secondary | ICD-10-CM

## 2020-06-15 MED ORDER — DOXYCYCLINE HYCLATE 100 MG PO TABS: 100.0000 mg | ORAL_TABLET | Freq: Two times a day (BID) | ORAL | 0 refills | Status: DC

## 2020-06-15 MED ORDER — OXYCODONE-ACETAMINOPHEN 5-325 MG PO TABS: 1.0000 | ORAL_TABLET | Freq: Four times a day (QID) | ORAL | 0 refills | Status: DC | PRN

## 2020-06-15 MED ORDER — METHYLPREDNISOLONE ACETATE 40 MG/ML IJ SUSP
40.0000 mg | Freq: Once | INTRAMUSCULAR | Status: AC
  Administered 2020-06-15: 40 mg via INTRAMUSCULAR

## 2020-06-15 MED ORDER — SPIRONOLACTONE 25 MG PO TABS: 25.0000 mg | ORAL_TABLET | Freq: Every day | ORAL | 3 refills | Status: DC

## 2020-06-15 MED ORDER — IPRATROPIUM-ALBUTEROL 0.5-2.5 (3) MG/3ML IN SOLN
3.0000 mL | Freq: Once | RESPIRATORY_TRACT | Status: AC
  Administered 2020-06-15: 3 mL via RESPIRATORY_TRACT

## 2020-06-15 MED ORDER — PREDNISONE 20 MG PO TABS: ORAL_TABLET | ORAL | 0 refills | Status: DC

## 2020-06-15 MED ORDER — FUROSEMIDE 40 MG PO TABS: ORAL_TABLET | ORAL | 1 refills | Status: DC

## 2020-06-15 NOTE — Assessment & Plan Note (Signed)
Oxygen dependent End stage COPD Today with exacerbation, treat with Depo Medrol 40mg  IM in office, prednisone taper to start at 40mg  and taper back down to her 10mg  dose  Mucinex Has f/u pulmonary on Monday  She is stable for outpatient treatement

## 2020-06-15 NOTE — Assessment & Plan Note (Signed)
Per pulmonary 

## 2020-06-15 NOTE — Addendum Note (Signed)
Addended by: Vic Blackbird F on: 06/15/2020 10:57 AM   Modules accepted: Orders

## 2020-06-15 NOTE — Progress Notes (Signed)
Subjective:    Patient ID: Cindy [name], female    DOB: [birth year], 64 y.o.   MRN: [id number]  Patient presents for Follow-up (is not fasting)  DM- last A1C 7.45 in March, she is on long acting prednisone 10mg  once a day   taking glipizide 5mg  BID, did not bring meter  no hypoglycemia symptoms  COPD- feels like she is in exacerbation today, increase cough with wheeze, production  sinus pressure. She is taking all her meds as prescribed Has not had neb treatmennt today, request in office  Has appt with pulmonary on Monday  Osteoporosis- osteopenia on last bone density in  2018 but has pathological fracture in spine, therefore she still has a lot of joint pain.  She felt the best method help her joint pain and back pain.  Described the use of this medication.  I did give her the option of trying a different type such as Reclast or Prolia but she prefers to stay on her current medication.  Has any GI upset.  CAD/HTNshe has not had any chest pain.  Shortness of breath per above due to her COPD  CHF he has been maintained on Lasix alternating doses as well as Aldactone.  She is not having edema her legs   Review Of Systems:  GEN- denies fatigue, fever, weight loss,weakness, recent illness HEENT- denies eye drainage, change in vision, nasal discharge, CVS- denies chest pain, palpitations RESP- denies SOB, cough, wheeze ABD- denies N/V, change in stools, abd pain GU- denies dysuria, hematuria, dribbling, incontinence MSK- denies joint pain, muscle aches, injury Neuro- denies headache, dizziness, syncope, seizure activity       Objective:    BP 130/78   Pulse (!) 112   Temp 98.1 F (36.7 C) (Temporal)   Resp 16   Ht 5\' 1"  (1.549 m)   Wt 182 lb (82.6 kg)   SpO2 93% Comment: 3L/min via [place]  BMI 34.39 kg/m  GEN- NAD, alert and oriented x3,chronically ill appearing ,Rolling walker HEENT- PERRL, EOMI, non injected sclera, pink conjunctiva, MMM, oropharynx clear Neck- Supple, no  thyromegaly CVS- tachycardia, no murmur, distant HS RESP- bilat wheeze, rhonchi, increased WOB with exertion, 3L oxygen  ABD-NABS,soft,NT, bloated appearance  EXT- No edema Pulses- Radial, DP- 2+        Assessment & Plan:      Problem List Items Addressed This Visit      Unprioritized   CAD (coronary artery disease)    Continue Plavix, BP controlled       Relevant Medications   furosemide (LASIX) 40 MG tablet   spironolactone (ALDACTONE) 25 MG tablet   Other Relevant Orders   Lipid panel   Chronic diastolic CHF (congestive heart failure) (HCC)    Fluid status appears Euvolemic Continue duiretics       Relevant Medications   furosemide (LASIX) 40 MG tablet   spironolactone (ALDACTONE) 25 MG tablet   Other Relevant Orders   Lipid panel   Chronic hypoxemic respiratory failure (HCC)    Oxygen dependent End stage COPD Today with exacerbation, treat with Depo Medrol 40mg  IM in office, prednisone taper to start at 40mg  and taper back down to her 10mg  dose  Mucinex Has f/u pulmonary on Monday  She is stable for outpatient treatement       Diabetes mellitus without complication ([place]) - Primary    Recheck A1C, chronic prednisone use contributing to elevated sugars Goal < 7%      Relevant Orders  CBC with Differential/Platelet   Comprehensive metabolic panel   Hemoglobin A1c   Lipid panel   Essential hypertension   Relevant Medications   furosemide (LASIX) 40 MG tablet   spironolactone (ALDACTONE) 25 MG tablet   Osteoporosis    At this time pt declines mammo/bone density as very taxing for her Continue fosamax Vitamin D/calcium      Pulmonary hypertension ([place])    Per pulmonary       Relevant Medications   furosemide (LASIX) 40 MG tablet   spironolactone (ALDACTONE) 25 MG tablet    Other Visit Diagnoses    COPD exacerbation (HCC)       Relevant Medications   predniSONE (DELTASONE) 20 MG tablet   methylPREDNISolone acetate (DEPO-MEDROL) injection 40  mg (Completed)   ipratropium-albuterol (DUONEB) 0.5-2.5 (3) MG/3ML nebulizer solution 3 mL (Completed)      Note: This dictation was prepared with Dragon dictation along with smaller phrase technology. Any transcriptional errors that result from this process are unintentional.

## 2020-06-15 NOTE — Assessment & Plan Note (Signed)
Continue Plavix, BP controlled

## 2020-06-15 NOTE — Assessment & Plan Note (Signed)
At this time pt declines mammo/bone density as very taxing for her Continue fosamax Vitamin D/calcium

## 2020-06-15 NOTE — Assessment & Plan Note (Signed)
Recheck A1C, chronic prednisone use contributing to elevated sugars Goal < 7%

## 2020-06-15 NOTE — Assessment & Plan Note (Signed)
Fluid status appears Euvolemic Continue duiretics

## 2020-06-15 NOTE — Patient Instructions (Signed)
Take the prednisone - start with 40mg  in the morning until you get back down to your 10mg  Take the doxycycline Use mucinex for cough We will call with lab results F/U  Months for Wellness visit

## 2020-06-16 LAB — CBC WITH DIFFERENTIAL/PLATELET
Absolute Monocytes: 1646 cells/uL — ABNORMAL HIGH (ref 200–950)
Basophils Absolute: 131 cells/uL (ref 0–200)
Basophils Relative: 0.7 %
Eosinophils Absolute: 112 cells/uL (ref 15–500)
Eosinophils Relative: 0.6 %
HCT: 43.6 % (ref 35.0–45.0)
Hemoglobin: 14 g/dL (ref 11.7–15.5)
Lymphs Abs: 5423 cells/uL — ABNORMAL HIGH (ref 850–3900)
MCH: 29.7 pg (ref 27.0–33.0)
MCHC: 32.1 g/dL (ref 32.0–36.0)
MCV: 92.6 fL (ref 80.0–100.0)
MPV: 12.3 fL (ref 7.5–12.5)
Monocytes Relative: 8.8 %
Neutro Abs: 11388 cells/uL — ABNORMAL HIGH (ref 1500–7800)
Neutrophils Relative %: 60.9 %
Platelets: 300 10*3/uL (ref 140–400)
RBC: 4.71 10*6/uL (ref 3.80–5.10)
RDW: 13.1 % (ref 11.0–15.0)
Total Lymphocyte: 29 %
WBC: 18.7 10*3/uL — ABNORMAL HIGH (ref 3.8–10.8)

## 2020-06-16 LAB — COMPREHENSIVE METABOLIC PANEL
AG Ratio: 1.8 (calc) (ref 1.0–2.5)
ALT: 30 U/L — ABNORMAL HIGH (ref 6–29)
AST: 26 U/L (ref 10–35)
Albumin: 4.2 g/dL (ref 3.6–5.1)
Alkaline phosphatase (APISO): 76 U/L (ref 37–153)
BUN: 13 mg/dL (ref 7–25)
CO2: 39 mmol/L — ABNORMAL HIGH (ref 20–32)
Calcium: 9.5 mg/dL (ref 8.6–10.4)
Chloride: 86 mmol/L — ABNORMAL LOW (ref 98–110)
Creat: 0.86 mg/dL (ref 0.50–0.99)
Globulin: 2.3 g/dL (calc) (ref 1.9–3.7)
Glucose, Bld: 144 mg/dL — ABNORMAL HIGH (ref 65–99)
Potassium: 3.6 mmol/L (ref 3.5–5.3)
Sodium: 138 mmol/L (ref 135–146)
Total Bilirubin: 0.5 mg/dL (ref 0.2–1.2)
Total Protein: 6.5 g/dL (ref 6.1–8.1)

## 2020-06-16 LAB — LIPID PANEL
Cholesterol: 141 mg/dL (ref <200)
HDL: 40 mg/dL — ABNORMAL LOW (ref >=50)
LDL Cholesterol (Calc): 70 mg/dL (calc)
Non-HDL Cholesterol (Calc): 101 mg/dL (calc) (ref <130)
Total CHOL/HDL Ratio: 3.5 (calc) (ref <5.0)
Triglycerides: 225 mg/dL — ABNORMAL HIGH (ref <150)

## 2020-06-16 LAB — MICROALBUMIN / CREATININE URINE RATIO
Creatinine, Urine: 130 mg/dL (ref 20–275)
Microalb Creat Ratio: 22 mcg/mg creat (ref <30)
Microalb, Ur: 2.8 mg/dL

## 2020-06-16 LAB — HEMOGLOBIN A1C
Hgb A1c MFr Bld: 7.3 % of total Hgb — ABNORMAL HIGH (ref <5.7)
Mean Plasma Glucose: 163 (calc)
eAG (mmol/L): 9 (calc)

## 2020-06-18 ENCOUNTER — Other Ambulatory Visit: Payer: Self-pay

## 2020-06-18 ENCOUNTER — Ambulatory Visit (INDEPENDENT_AMBULATORY_CARE_PROVIDER_SITE_OTHER): Payer: Medicare Other | Admitting: Emergency Medicine

## 2020-06-18 ENCOUNTER — Encounter: Payer: Self-pay | Admitting: Emergency Medicine

## 2020-06-18 DIAGNOSIS — J411 Mucopurulent chronic bronchitis: Secondary | ICD-10-CM | POA: Diagnosis not present

## 2020-06-18 DIAGNOSIS — Z720 Tobacco use: Secondary | ICD-10-CM | POA: Diagnosis not present

## 2020-06-18 DIAGNOSIS — J9611 Chronic respiratory failure with hypoxia: Secondary | ICD-10-CM | POA: Diagnosis not present

## 2020-06-18 NOTE — Assessment & Plan Note (Signed)
Recent exacerbation, currently being treated with a short prednisone taper back down to 10 mg daily.  She is improved.  Tolerating Symbicort, Spiriva.  The biggest issue is that she continues to smoke, not in a position to try and cut down at this time.  We did discuss it today.  Albuterol available either by nebulizer or HFA.  Her maintenance prednisone dose will be 10 milligrams daily

## 2020-06-18 NOTE — Progress Notes (Signed)
Virtual Visit via Telephone Note  I connected with Cindy [name] on 06/18/20 at 11:00 AM EDT by telephone and verified that I am speaking with the correct person using two identifiers.  Location: Patient: Home Provider: Office   I discussed the limitations, risks, security and privacy concerns of performing an evaluation and management service by telephone and the availability of in person appointments. I also discussed with the patient that there may be a patient responsible charge related to this service. The patient expressed understanding and agreed to proceed.   History of Present Illness: Cindy [name] is 74, active smoker with severe COPD and a chronic bronchitic phenotype.  She has frequent exacerbations.  Also associated hypoxemic respiratory failure on 3 L/min.  She has been on Spiriva, Symbicort, scheduled prednisone 10 mg daily   Observations/Objective: She was treated for an acute exacerbation 06/15/2020 by Dr. Buelah Manis with Depo-Medrol, initiation of prednisone taper, added Mucinex.  Short taper back to 10mg  Using albuterol about once or twice a day.  She reports that she was developing increased chest tightness, some increased nasal congestion and obstruction.  She is undergoing tooth extractions, dental work, had abx last month  Smoking 1 pk/day, not ready to try to set a quit date  Assessment and Plan: COPD (chronic obstructive pulmonary disease) ([place]) Recent exacerbation, currently being treated with a short prednisone taper back down to 10 mg daily.  She is improved.  Tolerating Symbicort, Spiriva.  The biggest issue is that she continues to smoke, not in a position to try and cut down at this time.  We did discuss it today.  Albuterol available either by nebulizer or HFA.  Her maintenance prednisone dose will be 10 milligrams daily  Chronic hypoxemic respiratory failure (HCC) Continue supplemental oxygen 3 L/min  Tobacco abuse Discussed with her in detail today.  She does  not believe she can cut down from 1 pack daily.  I offered support, possible medications to assist her when she is ready to decrease.    Follow Up Instructions: 3 months   I discussed the assessment and treatment plan with the patient. The patient was provided an opportunity to ask questions and all were answered. The patient agreed with the plan and demonstrated an understanding of the instructions.   The patient was advised to call back or seek an in-person evaluation if the symptoms worsen or if the condition fails to improve as anticipated.  I provided 21 minutes of non-face-to-face time during this encounter.   Collene Gobble, MD

## 2020-06-18 NOTE — Assessment & Plan Note (Signed)
Discussed with her in detail today.  She does not believe she can cut down from 1 pack daily.  I offered support, possible medications to assist her when she is ready to decrease.

## 2020-06-18 NOTE — Assessment & Plan Note (Signed)
Continue supplemental oxygen 3 L/min

## 2020-06-19 ENCOUNTER — Telehealth: Payer: Self-pay | Admitting: Family Medicine

## 2020-06-19 NOTE — Telephone Encounter (Signed)
Call placed to patient.   Reports that she she is having increased sinus pressure and ear pressure.   Advised to continue Doxycycline and Prednisone. Advised to add nasal saline and humidifier to rooms.   Advised if no improvement noted after ABTx, schedule appointment for re-evaluation.

## 2020-06-19 NOTE — Telephone Encounter (Signed)
CB# [phone] Pt having lot of congestion, head stop up also right ear stop up. Medication be sent the pharmacy

## 2020-07-04 ENCOUNTER — Other Ambulatory Visit: Payer: Self-pay | Admitting: Family Medicine

## 2020-07-11 ENCOUNTER — Telehealth: Payer: Self-pay | Admitting: Cardiovascular Disease

## 2020-07-11 NOTE — Telephone Encounter (Signed)
We did not call her.looks like Dr.[place]'s office may have.

## 2020-07-11 NOTE — Telephone Encounter (Signed)
New message    Patient received call from someone checking on her , she is doing well, she is going to take a nap , If you need to speak with her please give her a call back later ?  Thank you  Jannet Askew

## 2020-07-13 ENCOUNTER — Other Ambulatory Visit: Payer: Self-pay | Admitting: Family Medicine

## 2020-07-17 ENCOUNTER — Other Ambulatory Visit: Payer: Self-pay | Admitting: Family Medicine

## 2020-08-23 ENCOUNTER — Other Ambulatory Visit: Payer: Self-pay | Admitting: Emergency Medicine

## 2020-08-23 ENCOUNTER — Telehealth: Payer: Self-pay | Admitting: Emergency Medicine

## 2020-08-23 ENCOUNTER — Telehealth: Payer: Self-pay | Admitting: Family Medicine

## 2020-08-23 ENCOUNTER — Other Ambulatory Visit: Payer: Self-pay | Admitting: Family Medicine

## 2020-08-23 NOTE — Telephone Encounter (Signed)
Call placed to patient.   Reports that she still has chest congestion and SOB, especially with the heat. States that she has a lot of back pain.   Advised OV will be required.   Appointment scheduled.

## 2020-08-23 NOTE — Telephone Encounter (Signed)
Called [facility] to confirm that they received RX for Prednisone. She confirmed and stated it was ready for pickup. Called patient to let her know same. She expressed understanding. Nothing further needed at this time.

## 2020-08-23 NOTE — Telephone Encounter (Signed)
[phone] Pt still cough, congestion can Dr.McDonald Chapel prescribe any medication.

## 2020-08-24 ENCOUNTER — Telehealth: Payer: Self-pay | Admitting: *Deleted

## 2020-08-24 ENCOUNTER — Other Ambulatory Visit: Payer: Self-pay

## 2020-08-24 ENCOUNTER — Ambulatory Visit (INDEPENDENT_AMBULATORY_CARE_PROVIDER_SITE_OTHER): Payer: Medicare Other | Admitting: Family Medicine

## 2020-08-24 ENCOUNTER — Encounter: Payer: Self-pay | Admitting: Family Medicine

## 2020-08-24 VITALS — BP 132/76 | HR 124 | Temp 98.1°F | Resp 26 | Ht 61.0 in | Wt 181.0 lb

## 2020-08-24 DIAGNOSIS — E119 Type 2 diabetes mellitus without complications: Secondary | ICD-10-CM | POA: Diagnosis not present

## 2020-08-24 DIAGNOSIS — G8929 Other chronic pain: Secondary | ICD-10-CM

## 2020-08-24 DIAGNOSIS — J9611 Chronic respiratory failure with hypoxia: Secondary | ICD-10-CM

## 2020-08-24 DIAGNOSIS — E785 Hyperlipidemia, unspecified: Secondary | ICD-10-CM

## 2020-08-24 DIAGNOSIS — I2510 Atherosclerotic heart disease of native coronary artery without angina pectoris: Secondary | ICD-10-CM | POA: Diagnosis not present

## 2020-08-24 DIAGNOSIS — J441 Chronic obstructive pulmonary disease with (acute) exacerbation: Secondary | ICD-10-CM | POA: Diagnosis not present

## 2020-08-24 MED ORDER — METHOCARBAMOL 500 MG PO TABS: 500.0000 mg | ORAL_TABLET | Freq: Three times a day (TID) | ORAL | 1 refills | Status: DC | PRN

## 2020-08-24 MED ORDER — PREDNISONE 10 MG PO TABS: ORAL_TABLET | ORAL | 0 refills | Status: DC

## 2020-08-24 MED ORDER — DOXYCYCLINE MONOHYDRATE 100 MG PO CAPS: 100.0000 mg | ORAL_CAPSULE | Freq: Two times a day (BID) | ORAL | 0 refills | Status: DC

## 2020-08-24 MED ORDER — DOXYCYCLINE HYCLATE 100 MG PO TABS: 100.0000 mg | ORAL_TABLET | Freq: Two times a day (BID) | ORAL | 0 refills | Status: DC

## 2020-08-24 MED ORDER — GUAIFENESIN-CODEINE 100-10 MG/5ML PO SOLN: 5.0000 mL | Freq: Four times a day (QID) | ORAL | 0 refills | Status: DC | PRN

## 2020-08-24 MED ORDER — IPRATROPIUM-ALBUTEROL 0.5-2.5 (3) MG/3ML IN SOLN
3.0000 mL | Freq: Once | RESPIRATORY_TRACT | Status: AC
  Administered 2020-08-24: 3 mL via RESPIRATORY_TRACT

## 2020-08-24 MED ORDER — METHYLPREDNISOLONE ACETATE 80 MG/ML IJ SUSP
80.0000 mg | Freq: Once | INTRAMUSCULAR | Status: AC
  Administered 2020-08-24: 80 mg via INTRAMUSCULAR

## 2020-08-24 MED ORDER — FLUCONAZOLE 150 MG PO TABS: ORAL_TABLET | ORAL | 0 refills | Status: DC

## 2020-08-24 NOTE — Patient Instructions (Signed)
F/U 4 months  

## 2020-08-24 NOTE — Telephone Encounter (Signed)
Received request from pharmacy for PA on Doxycycline.   PA submitted.   Dx: J44.1- COPD Exacerbation  Your information has been submitted to [facility]. [employer] will review the request and will issue a decision, typically within 3-7 days from your submission. You can check the updated outcome later by reopening this request.  If [employer] has not responded in 3-7 days or if you have any questions about your ePA request, please contact [employer] at [phone]. If you think there may be a problem with your PA request, use our live chat feature at the bottom right.  For [place] requests, please call [phone].

## 2020-08-24 NOTE — Addendum Note (Signed)
Addended by: Vic Blackbird F on: 08/24/2020 02:43 PM   Modules accepted: Orders

## 2020-08-24 NOTE — Progress Notes (Signed)
Subjective:    Patient ID: Cindy [name], female    DOB: 05-24-1956, 64 y.o.   MRN: [id number]  Patient presents for COPD Exacerbation (cough, chest congestion) and Back Pain   Pt here here COPD exacerbation  She has severe COPD with chronic respiratory failure on oxygen 3 L.  The past 2 days has had increased cough with sputum, congestion wheezing.     She has not had any fever or chills.  She has had her Covid vaccination. She has been using her nebulizer as prescribed along with her other inhalers.  She did not due the treatment this morning she was rushing.   DM- last A1C 7.3%, taking glipizide states that her sugars have been okay typically 90s to low 100s.  No hypoglycemia episodes.  Back pain.  She has chronic back pain with history of compression fracture as well as degenerative disc problems.  She gets radiating symptoms down her right leg which is very common.  States it feels tight like a spasm.  She gets occasional tingling or numbness.  She does take oxycodone but not daily.  She requests something to help with the spasm.  No change in bowel or bladder   Review Of Systems:  GEN- denies fatigue, fever, weight loss,weakness, recent illness HEENT- denies eye drainage, change in vision, nasal discharge, CVS- denies chest pain, palpitations RESP- + SOB, +cough, +wheeze ABD- denies N/V, change in stools, abd pain GU- denies dysuria, hematuria, dribbling, incontinence MSK- + joint pain, muscle aches, injury Neuro- denies headache, dizziness, syncope, seizure activity       Objective:    BP 132/76   Pulse (!) 124   Temp 98.1 F (36.7 C) (Temporal)   Resp (!) 26   Ht 5\' 1"  (1.549 m)   Wt 181 lb (82.1 kg)   SpO2 100% Comment: 3L/min via [place]  BMI 34.20 kg/m  GEN- NAD, alert and oriented x3,chronically ill appearing ,Rolling walker HEENT- PERRL, EOMI, non injected sclera, pink conjunctiva, MMM, oropharynx clear Neck- Supple, no thyromegaly CVS- tachycardia HR 110, no  murmur, distant HS RESP- bilat wheeze, rhonchi, increased WOB with exertion, 3L oxygen  ABD-NABS,soft,NT, ND  MSK- Decreased ROm spine, TTP lumbar spine bilat, neg SLR, +spasm back EXT- No edema Pulses- Radial, DP- 2+  S/p neb in office, improved WOB, wheeze, congestion persist, oxygen sat stable covid vaccine UTD     Assessment & Plan:      Problem List Items Addressed This Visit      Unprioritized   Chronic hypoxemic respiratory failure (HCC) - Primary   Chronic pain    Chronic back pain with radicular symptoms Also has neck pain Declines returning to her ortho specialist Given robaxin for spasm Steroids will help nerve inflammation Still has oxycodone prn  No red flags Using walker to help ambulate       Relevant Medications   methocarbamol (ROBAXIN) 500 MG tablet   predniSONE (DELTASONE) 10 MG tablet   Diabetes mellitus without complication (HCC)    Check A1C as difficult to get her into office with her respiratory status  Goal A1C < 7% Continue glipzide 5mg  BID         Relevant Orders   CBC with Differential/Platelet   Basic metabolic panel   Hemoglobin A1c   Hyperlipidemia   Relevant Orders   Lipid panel    Other Visit Diagnoses    COPD exacerbation ([place])       Treat for COPD exacerbation, doyxycline, higher prednisone  taper, until she resumes her 10mg , nebs, robitussin DM   Relevant Medications   predniSONE (DELTASONE) 10 MG tablet   guaiFENesin-codeine 100-10 MG/5ML syrup   ipratropium-albuterol (DUONEB) 0.5-2.5 (3) MG/3ML nebulizer solution 3 mL (Completed)   methylPREDNISolone acetate (DEPO-MEDROL) injection 80 mg (Completed)      Note: This dictation was prepared with Dragon dictation along with smaller phrase technology. Any transcriptional errors that result from this process are unintentional.

## 2020-08-24 NOTE — Telephone Encounter (Signed)
Received request for further information.   Reports that alternatives include Doxycyline monohydrate.   Medication changed.

## 2020-08-24 NOTE — Assessment & Plan Note (Signed)
Check A1C as difficult to get her into office with her respiratory status  Goal A1C < 7% Continue glipzide 5mg  BID

## 2020-08-24 NOTE — Telephone Encounter (Signed)
Noted  

## 2020-08-24 NOTE — Assessment & Plan Note (Signed)
Chronic back pain with radicular symptoms Also has neck pain Declines returning to her ortho specialist Given robaxin for spasm Steroids will help nerve inflammation Still has oxycodone prn  No red flags Using walker to help ambulate

## 2020-08-25 LAB — CBC WITH DIFFERENTIAL/PLATELET
Absolute Monocytes: 1786 cells/uL — ABNORMAL HIGH (ref 200–950)
Basophils Absolute: 152 cells/uL (ref 0–200)
Basophils Relative: 0.8 %
Eosinophils Absolute: 133 cells/uL (ref 15–500)
Eosinophils Relative: 0.7 %
HCT: 42.7 % (ref 35.0–45.0)
Hemoglobin: 13.8 g/dL (ref 11.7–15.5)
Lymphs Abs: 5795 cells/uL — ABNORMAL HIGH (ref 850–3900)
MCH: 29.9 pg (ref 27.0–33.0)
MCHC: 32.3 g/dL (ref 32.0–36.0)
MCV: 92.6 fL (ref 80.0–100.0)
MPV: 11.9 fL (ref 7.5–12.5)
Monocytes Relative: 9.4 %
Neutro Abs: 11134 cells/uL — ABNORMAL HIGH (ref 1500–7800)
Neutrophils Relative %: 58.6 %
Platelets: 307 10*3/uL (ref 140–400)
RBC: 4.61 10*6/uL (ref 3.80–5.10)
RDW: 13.1 % (ref 11.0–15.0)
Total Lymphocyte: 30.5 %
WBC: 19 10*3/uL — ABNORMAL HIGH (ref 3.8–10.8)

## 2020-08-25 LAB — BASIC METABOLIC PANEL
BUN/Creatinine Ratio: 11 (calc) (ref 6–22)
BUN: 12 mg/dL (ref 7–25)
CO2: 40 mmol/L — ABNORMAL HIGH (ref 20–32)
Calcium: 10.4 mg/dL (ref 8.6–10.4)
Chloride: 87 mmol/L — ABNORMAL LOW (ref 98–110)
Creat: 1.13 mg/dL — ABNORMAL HIGH (ref 0.50–0.99)
Glucose, Bld: 141 mg/dL — ABNORMAL HIGH (ref 65–99)
Potassium: 3.3 mmol/L — ABNORMAL LOW (ref 3.5–5.3)
Sodium: 139 mmol/L (ref 135–146)

## 2020-08-25 LAB — LIPID PANEL
Cholesterol: 153 mg/dL (ref <200)
HDL: 46 mg/dL — ABNORMAL LOW (ref >=50)
LDL Cholesterol (Calc): 76 mg/dL (calc)
Non-HDL Cholesterol (Calc): 107 mg/dL (calc) (ref <130)
Total CHOL/HDL Ratio: 3.3 (calc) (ref <5.0)
Triglycerides: 215 mg/dL — ABNORMAL HIGH (ref <150)

## 2020-08-25 LAB — HEMOGLOBIN A1C
Hgb A1c MFr Bld: 7.7 % of total Hgb — ABNORMAL HIGH (ref <5.7)
Mean Plasma Glucose: 174 (calc)
eAG (mmol/L): 9.7 (calc)

## 2020-08-28 ENCOUNTER — Other Ambulatory Visit: Payer: Self-pay | Admitting: *Deleted

## 2020-08-28 MED ORDER — POTASSIUM CHLORIDE CRYS ER 20 MEQ PO TBCR: 20.0000 meq | EXTENDED_RELEASE_TABLET | Freq: Every day | ORAL | 0 refills | Status: DC

## 2020-08-28 MED ORDER — GLIPIZIDE 5 MG PO TABS: 7.5000 mg | ORAL_TABLET | Freq: Two times a day (BID) | ORAL | 3 refills | Status: DC

## 2020-08-30 ENCOUNTER — Telehealth: Payer: Self-pay

## 2020-08-30 DIAGNOSIS — M5441 Lumbago with sciatica, right side: Secondary | ICD-10-CM

## 2020-08-30 DIAGNOSIS — M51369 Other intervertebral disc degeneration, lumbar region without mention of lumbar back pain or lower extremity pain: Secondary | ICD-10-CM

## 2020-08-30 DIAGNOSIS — G8929 Other chronic pain: Secondary | ICD-10-CM

## 2020-08-30 DIAGNOSIS — M4300 Spondylolysis, site unspecified: Secondary | ICD-10-CM

## 2020-08-30 DIAGNOSIS — Z8781 Personal history of (healed) traumatic fracture: Secondary | ICD-10-CM

## 2020-08-30 DIAGNOSIS — M5136 Other intervertebral disc degeneration, lumbar region: Secondary | ICD-10-CM

## 2020-08-30 NOTE — Telephone Encounter (Signed)
MD please advise

## 2020-08-30 NOTE — Telephone Encounter (Signed)
Referral placed, has seen spine and scoli[facility] in 2019 for her back

## 2020-08-30 NOTE — Telephone Encounter (Signed)
Pt called wanting a referral to see ortho for her back. She stated that Dr. Buelah Manis would put it in if needed.

## 2020-09-07 ENCOUNTER — Other Ambulatory Visit: Payer: Self-pay | Admitting: Family Medicine

## 2020-09-14 DIAGNOSIS — M810 Age-related osteoporosis without current pathological fracture: Secondary | ICD-10-CM | POA: Diagnosis not present

## 2020-09-14 DIAGNOSIS — M47816 Spondylosis without myelopathy or radiculopathy, lumbar region: Secondary | ICD-10-CM | POA: Diagnosis not present

## 2020-09-14 DIAGNOSIS — M7918 Myalgia, other site: Secondary | ICD-10-CM | POA: Diagnosis not present

## 2020-09-19 ENCOUNTER — Other Ambulatory Visit: Payer: Self-pay

## 2020-09-19 ENCOUNTER — Ambulatory Visit (INDEPENDENT_AMBULATORY_CARE_PROVIDER_SITE_OTHER): Payer: Medicare Other | Admitting: Emergency Medicine

## 2020-09-19 ENCOUNTER — Encounter: Payer: Self-pay | Admitting: Emergency Medicine

## 2020-09-19 DIAGNOSIS — J411 Mucopurulent chronic bronchitis: Secondary | ICD-10-CM

## 2020-09-19 DIAGNOSIS — J309 Allergic rhinitis, unspecified: Secondary | ICD-10-CM

## 2020-09-19 DIAGNOSIS — Z720 Tobacco use: Secondary | ICD-10-CM

## 2020-09-19 DIAGNOSIS — J9611 Chronic respiratory failure with hypoxia: Secondary | ICD-10-CM

## 2020-09-19 MED ORDER — PREDNISONE 10 MG PO TABS: 10.0000 mg | ORAL_TABLET | Freq: Every day | ORAL | 3 refills | Status: DC

## 2020-09-19 NOTE — Progress Notes (Signed)
Virtual Visit via Telephone Note  I connected with Cindy [name] on 09/19/20 at  9:30 AM EDT by telephone and verified that I am speaking with the correct person using two identifiers.  Location: Patient: Home Provider: Office   I discussed the limitations, risks, security and privacy concerns of performing an evaluation and management service by telephone and the availability of in person appointments. I also discussed with the patient that there may be a patient responsible charge related to this service. The patient expressed understanding and agreed to proceed.   History of Present Illness: 64 year old active smoker with severe COPD, chronic bronchitis with frequent exacerbations.  She has associated hypoxemic respiratory failure, uses 3 L/min at all times.  We have managed her on daily prednisone, currently 10 mg daily.  Also Spiriva and Symbicort.  She was treated with an increase dose of prednisone, Depo-Medrol 08/24/2020 by Dr. Buelah Manis.     Observations/Objective: Today she reports that she is dealing with back pain, radicular pain. She is back down to her usual 10mg  pred. Remains on spiriva and symbicort. Uses albuterol about 1-2x a day.  COVID vaccine up to date, also needs flu shot.   Assessment and Plan: COPD (chronic obstructive pulmonary disease) ([place]) Treated for flaring symptoms just under 1 month ago, prednisone now back down to 10 mg daily.  She was also given codeine cough syrup at that visit 8/27.  She will continue to have flares and poorly controlled cough, mucus production, wheezing as long she still smokes.  We will try to concentrate on smoking cessation she has had a lot of difficulty with this.  For now continue her maintenance Spiriva, Symbicort, prednisone 10 mg daily Discussed COVID booster with her. Needs flu shot.   Chronic hypoxemic respiratory failure (HCC) Oxygen 3 L/min  Allergic rhinitis Would increase her fluticasone nasal spray to once every day  instead of as needed  Tobacco abuse Desperately needs smoking cessation.  Still smoking about 1 pack daily.    Follow Up Instructions: 3 months   I discussed the assessment and treatment plan with the patient. The patient was provided an opportunity to ask questions and all were answered. The patient agreed with the plan and demonstrated an understanding of the instructions.   The patient was advised to call back or seek an in-person evaluation if the symptoms worsen or if the condition fails to improve as anticipated.  I provided 16 minutes of non-face-to-face time during this encounter.   Baltazar Apo, MD, PhD 09/19/2020, 9:20 AM [facility] [phone] or if no answer [phone]

## 2020-09-19 NOTE — Assessment & Plan Note (Signed)
Would increase her fluticasone nasal spray to once every day instead of as needed

## 2020-09-19 NOTE — Assessment & Plan Note (Signed)
Desperately needs smoking cessation.  Still smoking about 1 pack daily.

## 2020-09-19 NOTE — Assessment & Plan Note (Addendum)
Treated for flaring symptoms just under 1 month ago, prednisone now back down to 10 mg daily.  She was also given codeine cough syrup at that visit 8/27.  She will continue to have flares and poorly controlled cough, mucus production, wheezing as long she still smokes.  We will try to concentrate on smoking cessation she has had a lot of difficulty with this.  For now continue her maintenance Spiriva, Symbicort, prednisone 10 mg daily Discussed COVID booster with her. Needs flu shot.

## 2020-09-19 NOTE — Assessment & Plan Note (Signed)
Oxygen 3 L/min

## 2020-09-20 ENCOUNTER — Other Ambulatory Visit: Payer: Self-pay | Admitting: *Deleted

## 2020-09-20 MED ORDER — GLIPIZIDE 5 MG PO TABS: 7.5000 mg | ORAL_TABLET | Freq: Two times a day (BID) | ORAL | 3 refills | Status: DC

## 2020-10-03 ENCOUNTER — Ambulatory Visit (INDEPENDENT_AMBULATORY_CARE_PROVIDER_SITE_OTHER): Payer: Medicare Other

## 2020-10-03 ENCOUNTER — Other Ambulatory Visit: Payer: Self-pay

## 2020-10-03 DIAGNOSIS — Z23 Encounter for immunization: Secondary | ICD-10-CM

## 2020-10-12 ENCOUNTER — Other Ambulatory Visit: Payer: Self-pay | Admitting: Family Medicine

## 2020-10-20 ENCOUNTER — Encounter (HOSPITAL_COMMUNITY): Payer: Self-pay | Admitting: Emergency Medicine

## 2020-10-20 ENCOUNTER — Emergency Department (HOSPITAL_COMMUNITY)
Admission: EM | Admit: 2020-10-20 | Discharge: 2020-10-20 | Disposition: A | Discharge status: Home / Self Care | Payer: Medicare Other | Attending: Emergency Medicine | Admitting: Emergency Medicine

## 2020-10-20 ENCOUNTER — Other Ambulatory Visit: Payer: Self-pay

## 2020-10-20 ENCOUNTER — Emergency Department (HOSPITAL_COMMUNITY): Payer: Medicare Other

## 2020-10-20 DIAGNOSIS — I5032 Chronic diastolic (congestive) heart failure: Secondary | ICD-10-CM | POA: Diagnosis not present

## 2020-10-20 DIAGNOSIS — Z20822 Contact with and (suspected) exposure to covid-19: Secondary | ICD-10-CM | POA: Insufficient documentation

## 2020-10-20 DIAGNOSIS — I110 Hypertensive heart disease with heart failure: Secondary | ICD-10-CM | POA: Insufficient documentation

## 2020-10-20 DIAGNOSIS — E1169 Type 2 diabetes mellitus with other specified complication: Secondary | ICD-10-CM | POA: Diagnosis not present

## 2020-10-20 DIAGNOSIS — Z79899 Other long term (current) drug therapy: Secondary | ICD-10-CM | POA: Insufficient documentation

## 2020-10-20 DIAGNOSIS — J441 Chronic obstructive pulmonary disease with (acute) exacerbation: Secondary | ICD-10-CM | POA: Diagnosis not present

## 2020-10-20 DIAGNOSIS — I2510 Atherosclerotic heart disease of native coronary artery without angina pectoris: Secondary | ICD-10-CM | POA: Diagnosis not present

## 2020-10-20 DIAGNOSIS — E785 Hyperlipidemia, unspecified: Secondary | ICD-10-CM | POA: Diagnosis not present

## 2020-10-20 DIAGNOSIS — J439 Emphysema, unspecified: Secondary | ICD-10-CM | POA: Diagnosis not present

## 2020-10-20 DIAGNOSIS — M549 Dorsalgia, unspecified: Secondary | ICD-10-CM | POA: Insufficient documentation

## 2020-10-20 DIAGNOSIS — G8929 Other chronic pain: Secondary | ICD-10-CM | POA: Insufficient documentation

## 2020-10-20 DIAGNOSIS — Z9981 Dependence on supplemental oxygen: Secondary | ICD-10-CM | POA: Diagnosis not present

## 2020-10-20 DIAGNOSIS — Z7984 Long term (current) use of oral hypoglycemic drugs: Secondary | ICD-10-CM | POA: Insufficient documentation

## 2020-10-20 DIAGNOSIS — F17210 Nicotine dependence, cigarettes, uncomplicated: Secondary | ICD-10-CM | POA: Insufficient documentation

## 2020-10-20 DIAGNOSIS — Z7982 Long term (current) use of aspirin: Secondary | ICD-10-CM | POA: Diagnosis not present

## 2020-10-20 DIAGNOSIS — R0602 Shortness of breath: Secondary | ICD-10-CM | POA: Diagnosis not present

## 2020-10-20 DIAGNOSIS — Z955 Presence of coronary angioplasty implant and graft: Secondary | ICD-10-CM | POA: Diagnosis not present

## 2020-10-20 LAB — CBC
HCT: 42.9 % (ref 36.0–46.0)
Hemoglobin: 13.7 g/dL (ref 12.0–15.0)
MCH: 30.4 pg (ref 26.0–34.0)
MCHC: 31.9 g/dL (ref 30.0–36.0)
MCV: 95.3 fL (ref 80.0–100.0)
Platelets: 310 10*3/uL (ref 150–400)
RBC: 4.5 MIL/uL (ref 3.87–5.11)
RDW: 14.4 % (ref 11.5–15.5)
WBC: 17.5 10*3/uL — ABNORMAL HIGH (ref 4.0–10.5)
nRBC: 0 % (ref 0.0–0.2)

## 2020-10-20 LAB — RESPIRATORY PANEL BY RT PCR (FLU A&B, COVID)
Influenza A by PCR: NEGATIVE
Influenza B by PCR: NEGATIVE
SARS Coronavirus 2 by RT PCR: NEGATIVE

## 2020-10-20 LAB — COMPREHENSIVE METABOLIC PANEL
ALT: 38 U/L (ref 0–44)
AST: 30 U/L (ref 15–41)
Albumin: 4.1 g/dL (ref 3.5–5.0)
Alkaline Phosphatase: 69 U/L (ref 38–126)
Anion gap: 13 (ref 5–15)
BUN: 14 mg/dL (ref 8–23)
CO2: 38 mmol/L — ABNORMAL HIGH (ref 22–32)
Calcium: 9 mg/dL (ref 8.9–10.3)
Chloride: 83 mmol/L — ABNORMAL LOW (ref 98–111)
Creatinine, Ser: 0.98 mg/dL (ref 0.44–1.00)
GFR, Estimated: >60 mL/min (ref >60)
Glucose, Bld: 232 mg/dL — ABNORMAL HIGH (ref 70–99)
Potassium: 3.9 mmol/L (ref 3.5–5.1)
Sodium: 134 mmol/L — ABNORMAL LOW (ref 135–145)
Total Bilirubin: 0.5 mg/dL (ref 0.3–1.2)
Total Protein: 7 g/dL (ref 6.5–8.1)

## 2020-10-20 MED ORDER — IPRATROPIUM BROMIDE 0.02 % IN SOLN
RESPIRATORY_TRACT | Status: AC
  Administered 2020-10-20: 0.5 mg
  Filled 2020-10-20: qty 2.5

## 2020-10-20 MED ORDER — IPRATROPIUM-ALBUTEROL 20-100 MCG/ACT IN AERS
INHALATION_SPRAY | RESPIRATORY_TRACT | Status: AC
  Filled 2020-10-20: qty 4

## 2020-10-20 MED ORDER — ALBUTEROL SULFATE (2.5 MG/3ML) 0.083% IN NEBU
INHALATION_SOLUTION | RESPIRATORY_TRACT | Status: AC
  Administered 2020-10-20: 5 mg
  Filled 2020-10-20: qty 6

## 2020-10-20 MED ORDER — ALBUTEROL SULFATE HFA 108 (90 BASE) MCG/ACT IN AERS: 4.0000 | INHALATION_SPRAY | Freq: Once | RESPIRATORY_TRACT | Status: DC

## 2020-10-20 MED ORDER — IPRATROPIUM BROMIDE HFA 17 MCG/ACT IN AERS
2.0000 | INHALATION_SPRAY | Freq: Once | RESPIRATORY_TRACT | Status: DC
  Filled 2020-10-20: qty 12.9

## 2020-10-20 MED ORDER — ALBUTEROL SULFATE HFA 108 (90 BASE) MCG/ACT IN AERS: 2.0000 | INHALATION_SPRAY | RESPIRATORY_TRACT | 1 refills | Status: DC | PRN

## 2020-10-20 MED ORDER — PREDNISONE 20 MG PO TABS: 60.0000 mg | ORAL_TABLET | Freq: Every day | ORAL | 0 refills | Status: DC

## 2020-10-20 MED ORDER — ALBUTEROL SULFATE (2.5 MG/3ML) 0.083% IN NEBU
5.0000 mg | INHALATION_SOLUTION | Freq: Once | RESPIRATORY_TRACT | Status: AC
  Administered 2020-10-20: 5 mg via RESPIRATORY_TRACT
  Filled 2020-10-20: qty 6

## 2020-10-20 MED ORDER — IPRATROPIUM BROMIDE HFA 17 MCG/ACT IN AERS: 2.0000 | INHALATION_SPRAY | Freq: Once | RESPIRATORY_TRACT | Status: DC

## 2020-10-20 MED ORDER — ALBUTEROL SULFATE HFA 108 (90 BASE) MCG/ACT IN AERS
4.0000 | INHALATION_SPRAY | Freq: Once | RESPIRATORY_TRACT | Status: DC
  Filled 2020-10-20: qty 6.7

## 2020-10-20 MED ORDER — TRAMADOL HCL 50 MG PO TABS
50.0000 mg | ORAL_TABLET | Freq: Once | ORAL | Status: AC
  Administered 2020-10-20: 50 mg via ORAL
  Filled 2020-10-20: qty 1

## 2020-10-20 MED ORDER — METHYLPREDNISOLONE SODIUM SUCC 125 MG IJ SOLR
125.0000 mg | Freq: Once | INTRAMUSCULAR | Status: AC
  Administered 2020-10-20: 125 mg via INTRAVENOUS
  Filled 2020-10-20: qty 2

## 2020-10-20 MED ORDER — IPRATROPIUM BROMIDE 0.02 % IN SOLN
0.5000 mg | Freq: Once | RESPIRATORY_TRACT | Status: AC
  Administered 2020-10-20: 0.5 mg via RESPIRATORY_TRACT
  Filled 2020-10-20: qty 2.5

## 2020-10-20 MED ORDER — HYDROMORPHONE HCL 1 MG/ML IJ SOLN
0.5000 mg | Freq: Once | INTRAMUSCULAR | Status: AC
  Administered 2020-10-20: 0.5 mg via INTRAVENOUS
  Filled 2020-10-20: qty 1

## 2020-10-20 NOTE — ED Triage Notes (Signed)
Pt reports she sees a pain doctor and that she also has low back pain and that her pain meds are not working   Pt speaks in multiple complete sentences and appears that she is within her normal work of breathing

## 2020-10-20 NOTE — Discharge Instructions (Addendum)
It was our pleasure to provide your ER care today - we hope that you feel better.  Take prednisone as prescribed. Use albuterol treatment every 3-4 hours as need. Avoid any smoking.   For pain, take acetaminophen as need. You may also take your muscle relaxer as need for symptom relief.   Follow up with your doctor this week.   Return to ER if worse, new symptoms, fevers, chest pain, increased trouble breathing, or other concern.

## 2020-10-20 NOTE — ED Provider Notes (Signed)
[facility] Provider Note   CSN: [id number] Arrival date & time: 10/20/20  1459     History Chief Complaint  Patient presents with  . Shortness of Breath    Cindy [name] is a 64 y.o. female.  Patient with hx copd, home o2 3 liters, presents with increased sob and wheezing in the past two days. Symptoms acute onset, moderate, persistent, worse today. +smoker, continues to smoke. Occasional non prod cough. No sore throat or runny nose. No fever or chills. No chest pain. Denies leg pain or swelling. Also c/o chronic back pain in area left trapezius muscle as well as low back. No arm/leg numbness or weakness. No recent injury or change in pain, states is a chronic problem for her. No problems w normal bowel or bladder function. No perineal or saddle area numbness.   The history is provided by the patient and the spouse.  Shortness of Breath Associated symptoms: cough and wheezing   Associated symptoms: no abdominal pain, no chest pain, no fever, no headaches, no rash, no sore throat and no vomiting        Past Medical History:  Diagnosis Date  . 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 Arthritis   . CAD (coronary artery disease)    a. NSTEMI 07/2018 s/p difficult PCI with overlapping DES to LAD, residual OM2 disease treated medically.  . Chronic diastolic CHF (congestive heart failure) ([place])   . Chronic pain   . Chronic respiratory failure ([place]) On home 02 2-3L  . Compression fracture of lumbar vertebra ([place])   . COPD (chronic obstructive pulmonary disease) ([place])   . Essential hypertension   . Hyperglycemia, drug-induced   . Hyperlipidemia   . Kidney stones 2014   Left side, multiple  . Leukocytosis   . Myocardial infarct ([place])   . Osteoporosis 2013  . Thyroid disease    pt denies  . Tobacco abuse     Patient Active Problem List   Diagnosis Date Noted  . Stucco keratoses 09/27/2019  . Diabetes mellitus without complication ([place]) 35/68/6168  . Chronic  pain 11/18/2018  . CAD (coronary artery disease) 10/05/2018  . Chronic diastolic CHF (congestive heart failure) ([place])   . S/P PTCA (percutaneous transluminal coronary angioplasty)   . Orthopnea   . Non-ST elevation (NSTEMI) myocardial infarction ([place])   . Essential hypertension   . Bilateral leg edema 08/11/2018  . Abnormal EKG 08/11/2018  . Diastolic dysfunction 37/29/0211  . Pulmonary hypertension ([place]) 05/01/2016  . Leg swelling 04/15/2016  . Rash and nonspecific skin eruption 08/15/2014  . Ureteral stone with hydronephrosis 05/30/2013  . Allergic rhinitis 04/11/2013  . Kidney stones 04/11/2013  . Lumbar back pain 03/03/2013  . Compression fracture of L3 lumbar vertebra 03/03/2013  . Constipation 03/03/2013  . Sinus tachycardia 06/27/2012  . Osteoporosis 03/16/2012  . GERD (gastroesophageal reflux disease) 03/16/2012  . Thrush 03/02/2012  . Chronic hypoxemic respiratory failure ([place]) 02/27/2012  . Hyperlipidemia 02/25/2012  . Tobacco abuse 09/24/2011  . COPD (chronic obstructive pulmonary disease) ([place]) 12/24/2009    Past Surgical History:  Procedure Laterality Date  . CERVICAL BIOPSY    . COLONOSCOPY N/A 03/31/2013   Procedure: COLONOSCOPY;  Surgeon: Rogene Houston, MD;  Location[facility];  Service: Endoscopy;  Laterality: N/A;  730  . CORONARY ATHERECTOMY N/A 08/19/2018   Procedure: CORONARY ATHERECTOMY;  Surgeon: Nelva Bush, MD;  Location: [facility];  Service: Cardiovascular;  Laterality: N/A;  . CORONARY  STENT INTERVENTION N/A 08/19/2018   Procedure: CORONARY STENT INTERVENTION;  Surgeon: Nelva Bush, MD;  Location: [facility];  Service: Cardiovascular;  Laterality: N/A;  . INTRAVASCULAR ULTRASOUND/IVUS N/A 08/19/2018   Procedure: Intravascular Ultrasound/IVUS;  Surgeon: Nelva Bush, MD;  Location: [facility];  Service: Cardiovascular;  Laterality: N/A;  . LEFT HEART CATH N/A 08/19/2018   Procedure: Left Heart Cath;  Surgeon:  Nelva Bush, MD;  Location: [facility];  Service: Cardiovascular;  Laterality: N/A;  . LEFT HEART CATH AND CORONARY ANGIOGRAPHY N/A 08/16/2018   Procedure: LEFT HEART CATH AND CORONARY ANGIOGRAPHY;  Surgeon: Nelva Bush, MD;  Location: [facility];  Service: Cardiovascular;  Laterality: N/A;  . TUBAL LIGATION       OB History   No obstetric history on file.     Family History  Problem Relation Age of Onset  . Heart disease Mother   . Hypertension Sister   . Hypertension Brother     Social History   Tobacco Use  . Smoking status: Current Some Day Smoker    Packs/day: 1.00    Years: 25.00    Pack years: 25.00    Types: Cigarettes  . Smokeless tobacco: Never Used  Vaping Use  . Vaping Use: Never used  Substance Use Topics  . Alcohol use: No  . Drug use: No    Home Medications Prior to Admission medications   Medication Sig Start Date End Date Taking? Authorizing Provider  albuterol (PROVENTIL) (2.5 MG/3ML) 0.083% nebulizer solution INHALE 1 VIAL VIA NEBULIZER EVERY 4 HOURS AS NEEDED. 10/12/20   Alycia Rossetti, MD  albuterol (VENTOLIN HFA) 108 (90 Base) MCG/ACT inhaler INHALE 2 PUFFS INTO THE LUNGS EVERY 6 HOURS AS NEEDED FOR WHEEZING OR SHORTNESS OF BREATH. 05/17/20   Alycia Rossetti, MD  alendronate (FOSAMAX) 70 MG tablet TAKE 1 TAB BY MOUTH EVERY WEEK ON AN EMPTY STOMACH. 07/04/20   Alycia Rossetti, MD  aspirin EC 81 MG tablet Take 81 mg by mouth every morning.     [provider]  atorvastatin (LIPITOR) 80 MG tablet Take 1 tablet (80 mg total) by mouth every evening. 12/16/19   Herminio Commons, MD  Blood Glucose Monitoring Suppl (BLOOD GLUCOSE SYSTEM PAK) KIT Please dispense based on patient and insurance preference. Use as directed to monitor FSBS 1x daily. Dx: E11.9. 09/29/19   Alycia Rossetti, MD  Calcium Carb-Cholecalciferol 600-800 MG-UNIT TABS Take 1 tablet by mouth 2 (two) times daily.     [provider]    clopidogrel (PLAVIX) 75 MG tablet TAKE ONE TABLET BY MOUTH ONCE DAILY. 08/23/20   Alycia Rossetti, MD  clotrimazole-betamethasone (LOTRISONE) cream APPLY TO AFFECTED AREA TWICE DAILY. 11/14/19   [place], Modena Nunnery, MD  Diphenhyd-Hydrocort-Nystatin (FIRST-DUKES MOUTHWASH) SUSP , swish 26m TID prn pain x 2 weeks 10/28/19   DAlycia Rossetti MD  diphenhydrAMINE (BENADRYL) 12.5 MG/5ML liquid Take 6.25 mg by mouth 4 (four) times daily as needed.    [provider]  doxycycline (MONODOX) 100 MG capsule Take 1 capsule (100 mg total) by mouth 2 (two) times daily. 08/24/20   DAlycia Rossetti MD  doxycycline (VIBRA-TABS) 100 MG tablet Take 1 tablet (100 mg total) by mouth 2 (two) times daily. 08/24/20   DAlycia Rossetti MD  ezetimibe (ZETIA) 10 MG tablet TAKE ONE TABLET BY MOUTH ONCE DAILY. 04/24/20   KHerminio Commons MD  fluticasone (FLONASE) 50 MCG/ACT nasal spray INSTILL 1  SPRAY INTO EACH NOSTRIL DAILY AS NEEDED FOR ALLERGIES. 09/07/20   [place], Modena Nunnery, MD  furosemide (LASIX) 40 MG tablet ALTERNATE TAKING 2 TABLETS BY MOUTH ONE DAY, THEN 1 TABLET THE NEXT DAY. REPEAT. 06/15/20   [place], Modena Nunnery, MD  glipiZIDE (GLUCOTROL) 5 MG tablet Take 1.5 tablets (7.5 mg total) by mouth 2 (two) times daily before a meal. 09/20/20   [place], Modena Nunnery, MD  Glucose Blood (BLOOD GLUCOSE TEST STRIPS) STRP Please dispense based on patient and insurance preference. Use as directed to monitor FSBS 1x daily. Dx: E11.9. 09/29/19   Alycia Rossetti, MD  guaiFENesin-codeine 100-10 MG/5ML syrup Take 5 mLs by mouth every 6 (six) hours as needed. 08/24/20   Alycia Rossetti, MD  ipratropium (ATROVENT) 0.03 % nasal spray Place 2 sprays into both nostrils every 12 (twelve) hours. 06/27/19   Alycia Rossetti, MD  Lancets MISC Please dispense based on patient and insurance preference. Use as directed to monitor FSBS 1x daily. Dx: E11.9. 09/29/19   Alycia Rossetti, MD  Magnesium 250 MG TABS Take 1 tablet daily Patient  taking differently: Take 250 mg by mouth daily.  11/17/18   [place], Modena Nunnery, MD  methocarbamol (ROBAXIN) 500 MG tablet Take 1 tablet (500 mg total) by mouth every 8 (eight) hours as needed for muscle spasms. 08/24/20   Alycia Rossetti, MD  metoprolol tartrate (LOPRESSOR) 25 MG tablet TAKE 1 TABLET BY MOUTH TWICE DAILY. 06/11/20   [place], Modena Nunnery, MD  nitroGLYCERIN (NITROSTAT) 0.4 MG SL tablet Place 1 tablet (0.4 mg total) under the tongue every 5 (five) minutes as needed for chest pain. If no improvement with 3 doses call 911 08/27/18   Delsa Grana, PA-C  nystatin (MYCOSTATIN) 100000 UNIT/ML suspension Take 5 mLs (500,000 Units total) by mouth 4 (four) times daily. 04/16/20   Alycia Rossetti, MD  nystatin (MYCOSTATIN/NYSTOP) powder APPLY TO THE AFFECTED AREAS FOUR TIMES DAILY. 12/06/19   [place], Modena Nunnery, MD  oxyCODONE-acetaminophen (PERCOCET) 5-325 MG tablet Take 1 tablet by mouth every 6 (six) hours as needed for severe pain. 06/15/20   Alycia Rossetti, MD  OXYGEN Inhale 3 L into the lungs continuous.     [provider]  pantoprazole (PROTONIX) 40 MG tablet TAKE ONE TABLET BY MOUTH TWICE A DAY 06/15/20   Susy Frizzle, MD  polyethylene glycol (MIRALAX / GLYCOLAX) packet Take 17 g by mouth daily. Patient taking differently: Take 17 g by mouth daily as needed for mild constipation or moderate constipation.  06/04/18   Varney Biles, MD  potassium chloride SA (KLOR-CON) 20 MEQ tablet Take 1 tablet (20 mEq total) by mouth daily for 3 days. 08/28/20 08/31/20  Alycia Rossetti, MD  predniSONE (DELTASONE) 10 MG tablet Take 1 tablet (10 mg total) by mouth daily with breakfast. 09/19/20   [place], Rose Fillers, MD  SPIRIVA RESPIMAT 2.5 MCG/ACT AERS INHALE 2 PUFFS INTO THE LUNGS ONCE DAILY. 02/10/20   Alycia Rossetti, MD  spironolactone (ALDACTONE) 25 MG tablet Take 1 tablet (25 mg total) by mouth daily. 06/15/20   Alycia Rossetti, MD  SYMBICORT 160-4.5 MCG/ACT inhaler INHALE 2 PUFFS INTO THE  LUNGS TWICE DAILY. 07/16/20   Alycia Rossetti, MD    Allergies    Keflex [cephalexin], Sulfa antibiotics, Avelox [moxifloxacin hcl in nacl], and Toradol [ketorolac tromethamine]  Review of Systems   Review of Systems  Constitutional: Negative for fever.  HENT: Negative for sore throat.   Eyes:  Negative for redness.  Respiratory: Positive for cough, shortness of breath and wheezing.   Cardiovascular: Negative for chest pain and leg swelling.  Gastrointestinal: Negative for abdominal pain and vomiting.  Genitourinary: Negative for flank pain.  Musculoskeletal: Positive for back pain.  Skin: Negative for rash.  Neurological: Negative for weakness, numbness and headaches.  Hematological: Does not bruise/bleed easily.  Psychiatric/Behavioral: Negative for confusion.    Physical Exam Updated Vital Signs BP (!) 146/78 (BP Location: Left Arm)   Pulse (!) 103   Temp 98.3 F (36.8 C) (Oral)   Resp 18   Ht 1.575 m (5' 2" )   Wt 86.2 kg   SpO2 96%   BMI 34.75 kg/m   Physical Exam Vitals and nursing note reviewed.  Constitutional:      Appearance: Normal appearance. She is well-developed.  HENT:     Head: Atraumatic.     Nose: Nose normal.     Mouth/Throat:     Mouth: Mucous membranes are moist.  Eyes:     General: No scleral icterus.    Conjunctiva/sclera: Conjunctivae normal.  Neck:     Trachea: No tracheal deviation.  Cardiovascular:     Rate and Rhythm: Normal rate and regular rhythm.     Pulses: Normal pulses.     Heart sounds: Normal heart sounds. No murmur heard.  No friction rub. No gallop.   Pulmonary:     Effort: Respiratory distress present.     Breath sounds: Wheezing present.  Abdominal:     General: Bowel sounds are normal. There is no distension.     Palpations: Abdomen is soft.     Tenderness: There is no abdominal tenderness. There is no guarding.  Genitourinary:    Comments: No cva tenderness.  Musculoskeletal:        General: No swelling.      Cervical back: Normal range of motion and neck supple. No rigidity. No muscular tenderness.     Comments: CTLS spine, non tender, aligned, no step off. Left trapezius and left lumbar muscular tenderness.   Skin:    General: Skin is warm and dry.     Findings: No rash.  Neurological:     Mental Status: She is alert.     Comments: Alert, speech normal. Motor/sens grossly intact bil.   Psychiatric:        Mood and Affect: Mood normal.     ED Results / Procedures / Treatments   Labs (all labs ordered are listed, but only abnormal results are displayed) Results for orders placed or performed during the hospital encounter of 10/20/20  Respiratory Panel by RT PCR (Flu A&B, Covid) - Nasopharyngeal Swab   Specimen: Nasopharyngeal Swab  Result Value Ref Range   SARS Coronavirus 2 by RT PCR NEGATIVE NEGATIVE   Influenza A by PCR NEGATIVE NEGATIVE   Influenza B by PCR NEGATIVE NEGATIVE  CBC  Result Value Ref Range   WBC 17.5 (H) 4.0 - 10.5 K/uL   RBC 4.50 3.87 - 5.11 MIL/uL   Hemoglobin 13.7 12.0 - 15.0 g/dL   HCT 42.9 36 - 46 %   MCV 95.3 80.0 - 100.0 fL   MCH 30.4 26.0 - 34.0 pg   MCHC 31.9 30.0 - 36.0 g/dL   RDW 14.4 11.5 - 15.5 %   Platelets 310 150 - 400 K/uL   nRBC 0.0 0.0 - 0.2 %  Comprehensive metabolic panel  Result Value Ref Range   Sodium 134 (L) 135 - 145 mmol/L  Potassium 3.9 3.5 - 5.1 mmol/L   Chloride 83 (L) 98 - 111 mmol/L   CO2 38 (H) 22 - 32 mmol/L   Glucose, Bld 232 (H) 70 - 99 mg/dL   BUN 14 8 - 23 mg/dL   Creatinine, Ser 0.98 0.44 - 1.00 mg/dL   Calcium 9.0 8.9 - 10.3 mg/dL   Total Protein 7.0 6.5 - 8.1 g/dL   Albumin 4.1 3.5 - 5.0 g/dL   AST 30 15 - 41 U/L   ALT 38 0 - 44 U/L   Alkaline Phosphatase 69 38 - 126 U/L   Total Bilirubin 0.5 0.3 - 1.2 mg/dL   GFR, Estimated >60 >60 mL/min   Anion gap 13 5 - 15   DG Chest Port 1 View  Result Date: 10/20/2020 CLINICAL DATA:  Shortness of breath. EXAM: PORTABLE CHEST 1 VIEW COMPARISON:  November 06, 2019  FINDINGS: Emphysematous changes of the lungs with hyperinflation. No consolidation. No visible pleural effusions or pneumothorax. Stable cardiomediastinal silhouette. Aortic atherosclerosis. Coronary artery stent. Compression deformity of a lower thoracic vertebral body. IMPRESSION: 1. No acute cardiopulmonary disease. 2. Emphysematous changes of the lungs. Electronically Signed   By: Margaretha Sheffield MD   On: 10/20/2020 16:46    ED ECG REPORT   Date: 10/20/2020  Rate: 98  Rhythm: normal sinus rhythm  QRS Axis: right  Intervals: normal  ST/T Wave abnormalities: nonspecific ST/T changes  Conduction Disutrbances:none  Narrative Interpretation:   Old EKG Reviewed: unchanged  I have personally reviewed the EKG tracing    Radiology DG Chest Port 1 View  Result Date: 10/20/2020 CLINICAL DATA:  Shortness of breath. EXAM: PORTABLE CHEST 1 VIEW COMPARISON:  November 06, 2019 FINDINGS: Emphysematous changes of the lungs with hyperinflation. No consolidation. No visible pleural effusions or pneumothorax. Stable cardiomediastinal silhouette. Aortic atherosclerosis. Coronary artery stent. Compression deformity of a lower thoracic vertebral body. IMPRESSION: 1. No acute cardiopulmonary disease. 2. Emphysematous changes of the lungs. Electronically Signed   By: Margaretha Sheffield MD   On: 10/20/2020 16:46    Procedures Procedures (including critical care time)  Medications Ordered in ED Medications  albuterol (VENTOLIN HFA) 108 (90 Base) MCG/ACT inhaler 4 puff (has no administration in time range)  ipratropium (ATROVENT HFA) inhaler 2 puff (has no administration in time range)  methylPREDNISolone sodium succinate (SOLU-MEDROL) 125 mg/2 mL injection 125 mg (has no administration in time range)    ED Course  I have reviewed the triage vital signs and the nursing notes.  Pertinent labs & imaging results that were available during my care of the patient were reviewed by me and considered in my  medical decision making (see chart for details).    MDM Rules/Calculators/A&P                         Iv ns. Stat labs. Pcxr. Ecg. Albuterol and atrovent treatment. Solumedrol iv.   Reviewed nursing notes and prior charts for additional history.   CXR reviewed/interpreted by me - no pna.   Labs reviewed/interpreted by me - k normal. Wbc elevated - pt denies increased cough, denies fever, chills or sweats.   Wheezing persists on recheck. Additional albuterol and atrovent tx.   Recheck wheezing improved but persists. Additional albuterol/atrovent tx.   Patient requests additional pain med for her chronic pain. Dilaudid .5 mg iv.   Recheck pt, comfortable, no distress. Improved air movement. Breathing comfortably.   Pt currently appears stable for d/c.  Rec close pcp f/u.  Return precautions provided.      Final Clinical Impression(s) / ED Diagnoses Final diagnoses:  None    Rx / DC Orders ED Discharge Orders    None       Lajean Saver, MD 10/20/20 1826

## 2020-10-20 NOTE — ED Triage Notes (Signed)
Pt here for shortness of breath and pain   Denies CP   States she cannot take her meds due to inability to breathe   Pt with overwhelming odor of cigarettes Continues to smoke  [place] Progression Recent Vital Signs   There were no vitals taken for this visit.   Past Medical History:  Diagnosis Date  . 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 Arthritis   . CAD (coronary artery disease)    a. NSTEMI 07/2018 s/p difficult PCI with overlapping DES to LAD, residual OM2 disease treated medically.  . Chronic diastolic CHF (congestive heart failure) ([place])   . Chronic pain   . Chronic respiratory failure ([place]) On home 02 2-3L  . Compression fracture of lumbar vertebra ([place])   . COPD (chronic obstructive pulmonary disease) ([place])   . Essential hypertension   . Hyperglycemia, drug-induced   . Hyperlipidemia   . Kidney stones 2014   Left side, multiple  . Leukocytosis   . Myocardial infarct ([place])   . Osteoporosis 2013  . Thyroid disease    pt denies  . Tobacco abuse      Expected Discharge Date     Diet Order    None       VTE Documentation      Work Intensity Score/Level of Care     @LEVELOFCARE @   Mobility        Significant Events    DC Barriers   Abnormal Labs:  Cindy [name] A Chas Axel 10/20/2020, 3:11 PM PPD

## 2020-10-20 NOTE — ED Notes (Signed)
Spoke to Dr Ashok Cordia explained hospital did not have atrovent mdi. Asked if he want combivent. mdi or nebulizer since covid was negative. He approved use of nebulizer. Combivent returned to up stairs Pyxis

## 2020-10-20 NOTE — ED Notes (Signed)
Atrovent mdi is not in formulary at [facility]. Patient changed to albuterol / atrovent nebulizer. She is wheezing. Complaining mostly of back pain.

## 2020-10-26 ENCOUNTER — Telehealth: Payer: Self-pay | Admitting: Family Medicine

## 2020-10-26 ENCOUNTER — Other Ambulatory Visit: Payer: Medicare Other

## 2020-10-26 ENCOUNTER — Other Ambulatory Visit: Payer: Self-pay

## 2020-10-26 DIAGNOSIS — R319 Hematuria, unspecified: Secondary | ICD-10-CM | POA: Diagnosis not present

## 2020-10-28 LAB — URINALYSIS, ROUTINE W REFLEX MICROSCOPIC
Bacteria, UA: NONE SEEN /HPF
Bilirubin Urine: NEGATIVE
Glucose, UA: NEGATIVE
Hyaline Cast: NONE SEEN /LPF
Ketones, ur: NEGATIVE
Nitrite: NEGATIVE
Specific Gravity, Urine: 1.011 (ref 1.001–1.03)
pH: 7 (ref 5.0–8.0)

## 2020-10-28 LAB — URINE CULTURE
MICRO NUMBER:: 11136662
SPECIMEN QUALITY:: ADEQUATE

## 2020-10-30 ENCOUNTER — Other Ambulatory Visit: Payer: Self-pay | Admitting: *Deleted

## 2020-10-30 DIAGNOSIS — R319 Hematuria, unspecified: Secondary | ICD-10-CM

## 2020-10-30 DIAGNOSIS — R300 Dysuria: Secondary | ICD-10-CM

## 2020-10-30 MED ORDER — CIPROFLOXACIN HCL 500 MG PO TABS: 500.0000 mg | ORAL_TABLET | Freq: Two times a day (BID) | ORAL | 0 refills | Status: AC

## 2020-11-06 ENCOUNTER — Other Ambulatory Visit: Payer: Self-pay

## 2020-11-06 ENCOUNTER — Other Ambulatory Visit: Payer: Medicare Other

## 2020-11-06 DIAGNOSIS — R300 Dysuria: Secondary | ICD-10-CM | POA: Diagnosis not present

## 2020-11-06 DIAGNOSIS — R319 Hematuria, unspecified: Secondary | ICD-10-CM | POA: Diagnosis not present

## 2020-11-07 LAB — URINE CULTURE
MICRO NUMBER:: 11179491
Result:: NO GROWTH
SPECIMEN QUALITY:: ADEQUATE

## 2020-11-07 LAB — URINALYSIS, ROUTINE W REFLEX MICROSCOPIC
Bacteria, UA: NONE SEEN /HPF
Bilirubin Urine: NEGATIVE
Glucose, UA: NEGATIVE
Hyaline Cast: NONE SEEN /LPF
Ketones, ur: NEGATIVE
Nitrite: NEGATIVE
Specific Gravity, Urine: 1.013 (ref 1.001–1.03)
pH: 6.5 (ref 5.0–8.0)

## 2020-11-15 ENCOUNTER — Other Ambulatory Visit: Payer: Medicare Other

## 2020-11-15 ENCOUNTER — Telehealth: Payer: Self-pay | Admitting: *Deleted

## 2020-11-15 ENCOUNTER — Other Ambulatory Visit: Payer: Self-pay

## 2020-11-15 NOTE — Telephone Encounter (Signed)
Patient had caregiver drop off urine sample at office. Call placed to patient to inquire.   Patient reports that she has blood in urine. Advised that we are aware there is blood in urine and she was to f/u with urology. Patient reports that she cannot be seen at urology until 12/25/2020. Denies any other Sx.  Advised that as long as there are no other Sx, she will not need to leave further urine samples.   Also reports that she has increased SOB. Advised to contact pulmonology to follow up.   Of note, patient became upset stating the she can be taken care of better by Dr Buelah Manis than the other specialists as they do not listen to her.   Again advised to F/U with specialists per MD recommendations.

## 2020-11-15 NOTE — Telephone Encounter (Signed)
Agree, she was just in ER, needs to by pulmonary  If no symptoms okay to see urology in December

## 2020-11-16 ENCOUNTER — Other Ambulatory Visit: Payer: Self-pay | Admitting: Family Medicine

## 2020-11-19 ENCOUNTER — Other Ambulatory Visit: Payer: Self-pay | Admitting: Family Medicine

## 2020-11-19 MED ORDER — GUAIFENESIN-CODEINE 100-10 MG/5ML PO SOLN: 5.0000 mL | Freq: Four times a day (QID) | ORAL | 0 refills | Status: DC | PRN

## 2020-11-19 MED ORDER — OXYCODONE-ACETAMINOPHEN 5-325 MG PO TABS: 1.0000 | ORAL_TABLET | Freq: Four times a day (QID) | ORAL | 0 refills | Status: DC | PRN

## 2020-11-19 NOTE — Telephone Encounter (Signed)
Refill Oxycodone [facility] [address] in [place]

## 2020-11-19 NOTE — Telephone Encounter (Signed)
Ok to refill??  Last office visit 08/24/2020.  Last refill 06/15/2020.

## 2020-11-19 NOTE — Telephone Encounter (Signed)
Last refill on cough medication 08-24-20

## 2020-11-19 NOTE — Telephone Encounter (Signed)
Pt call back would like for her cough medication along with Oxycodone sent [place] in [place]

## 2020-11-20 ENCOUNTER — Other Ambulatory Visit: Payer: Self-pay | Admitting: Family Medicine

## 2020-11-20 MED ORDER — OXYCODONE-ACETAMINOPHEN 5-325 MG PO TABS: 1.0000 | ORAL_TABLET | Freq: Four times a day (QID) | ORAL | 0 refills | Status: DC | PRN

## 2020-11-20 NOTE — Telephone Encounter (Signed)
Call placed to patient to inquire.   States that she is switching pharmacies and would like prescription to be sen to [facility] on [address] in [place].   Please re-send. :)

## 2020-11-20 NOTE — Telephone Encounter (Signed)
Would like for Oxycodone to be sent [facility] in [place]

## 2020-11-30 ENCOUNTER — Telehealth: Payer: Self-pay | Admitting: Family Medicine

## 2020-11-30 NOTE — Telephone Encounter (Signed)
Still having blood in her urine not able to see the doctor until 12/25/2020 is anything that Dr.Belmont can do until her visit

## 2020-11-30 NOTE — Telephone Encounter (Signed)
Okay to  Schedule OV for pt, her last urine culture, even with the blood was normal If she has pain in her back suggestive of kidney stone go to ER  There is nothing otherwise to give her at this time, if no pain, but dark urine wait until she sees the urologist

## 2020-11-30 NOTE — Telephone Encounter (Signed)
Call placed to patient to inquire.    States that she has no other symptoms but is concerned about increased bright red blood in her urine. Advised that since no other Sx present, it is safe to wait for urology.   Offered appointment for evaluation, but patient declined.

## 2020-11-30 NOTE — Telephone Encounter (Signed)
Please advise 

## 2020-12-05 ENCOUNTER — Other Ambulatory Visit: Payer: Self-pay | Admitting: Family Medicine

## 2020-12-05 DIAGNOSIS — J441 Chronic obstructive pulmonary disease with (acute) exacerbation: Secondary | ICD-10-CM

## 2020-12-05 DIAGNOSIS — J9611 Chronic respiratory failure with hypoxia: Secondary | ICD-10-CM

## 2020-12-14 ENCOUNTER — Other Ambulatory Visit: Payer: Self-pay | Admitting: *Deleted

## 2020-12-14 ENCOUNTER — Telehealth: Payer: Self-pay | Admitting: *Deleted

## 2020-12-14 NOTE — Telephone Encounter (Signed)
Received call from patient.   Reports that she has multiple issues as follows:  States that she has rash to buttocks that is open and bleeding. States that she is using fanny cream on area. Unable to determine cause of rash. Advised to come in for evaluation.   Reports that she is feeling weak and would like her potassium checked. Advised that she would need evaluation.   Also reports that she has R foot pain. States that she had struck R foot and it is very discolored (purple) and tender. States that toes 3-5 are painful and swollen. States that no toes are dislocated. Advised to go have X-ray and to schedule appointment.   States that she will not go get X-ray, but appointment was scheduled for Monday.

## 2020-12-15 ENCOUNTER — Other Ambulatory Visit: Payer: Self-pay | Admitting: Family Medicine

## 2020-12-15 DIAGNOSIS — K219 Gastro-esophageal reflux disease without esophagitis: Secondary | ICD-10-CM

## 2020-12-17 ENCOUNTER — Other Ambulatory Visit: Payer: Self-pay

## 2020-12-17 ENCOUNTER — Encounter: Payer: Self-pay | Admitting: Family Medicine

## 2020-12-17 ENCOUNTER — Ambulatory Visit (INDEPENDENT_AMBULATORY_CARE_PROVIDER_SITE_OTHER): Payer: Medicare Other | Admitting: Family Medicine

## 2020-12-17 VITALS — BP 138/64 | HR 112 | Temp 98.1°F | Resp 24 | Ht 61.0 in | Wt 182.0 lb

## 2020-12-17 DIAGNOSIS — R319 Hematuria, unspecified: Secondary | ICD-10-CM | POA: Diagnosis not present

## 2020-12-17 DIAGNOSIS — J9611 Chronic respiratory failure with hypoxia: Secondary | ICD-10-CM

## 2020-12-17 DIAGNOSIS — I10 Essential (primary) hypertension: Secondary | ICD-10-CM

## 2020-12-17 DIAGNOSIS — L03317 Cellulitis of buttock: Secondary | ICD-10-CM | POA: Diagnosis not present

## 2020-12-17 DIAGNOSIS — S99921A Unspecified injury of right foot, initial encounter: Secondary | ICD-10-CM

## 2020-12-17 DIAGNOSIS — R531 Weakness: Secondary | ICD-10-CM | POA: Diagnosis not present

## 2020-12-17 DIAGNOSIS — L03031 Cellulitis of right toe: Secondary | ICD-10-CM | POA: Diagnosis not present

## 2020-12-17 DIAGNOSIS — R000 Tachycardia, unspecified: Secondary | ICD-10-CM

## 2020-12-17 DIAGNOSIS — E119 Type 2 diabetes mellitus without complications: Secondary | ICD-10-CM

## 2020-12-17 DIAGNOSIS — J441 Chronic obstructive pulmonary disease with (acute) exacerbation: Secondary | ICD-10-CM | POA: Diagnosis not present

## 2020-12-17 DIAGNOSIS — I2510 Atherosclerotic heart disease of native coronary artery without angina pectoris: Secondary | ICD-10-CM | POA: Diagnosis not present

## 2020-12-17 LAB — URINALYSIS, ROUTINE W REFLEX MICROSCOPIC
Bilirubin Urine: NEGATIVE
Glucose, UA: NEGATIVE
Nitrite: NEGATIVE
Specific Gravity, Urine: 1.01 (ref 1.001–1.03)
pH: 7 (ref 5.0–8.0)

## 2020-12-17 LAB — MICROSCOPIC MESSAGE

## 2020-12-17 MED ORDER — IPRATROPIUM-ALBUTEROL 0.5-2.5 (3) MG/3ML IN SOLN
3.0000 mL | Freq: Once | RESPIRATORY_TRACT | Status: AC
  Administered 2020-12-17: 3 mL via RESPIRATORY_TRACT

## 2020-12-17 MED ORDER — PREDNISONE 10 MG PO TABS: ORAL_TABLET | ORAL | 0 refills | Status: DC

## 2020-12-17 MED ORDER — MUPIROCIN 2 % EX OINT: TOPICAL_OINTMENT | CUTANEOUS | 0 refills | Status: DC

## 2020-12-17 MED ORDER — PREDNISONE 20 MG PO TABS: ORAL_TABLET | ORAL | 0 refills | Status: DC

## 2020-12-17 MED ORDER — AMOXICILLIN-POT CLAVULANATE 875-125 MG PO TABS: 1.0000 | ORAL_TABLET | Freq: Two times a day (BID) | ORAL | 0 refills | Status: DC

## 2020-12-17 MED ORDER — METHYLPREDNISOLONE ACETATE 80 MG/ML IJ SUSP
80.0000 mg | Freq: Once | INTRAMUSCULAR | Status: AC
  Administered 2020-12-17: 80 mg via INTRAMUSCULAR

## 2020-12-17 NOTE — Progress Notes (Signed)
Subjective:    Patient ID: Cindy [name], female    DOB: [birth year], 64 y.o.   MRN: [id number]  Patient presents for Breahting Issues (Chest congestion- thick white mucus, sore throat), Foot Pain (dicoloration), and Rash (buttocks)  States that she has rash to buttocks that is open and bleeding for the past week. States that she is using fanny cream on area. Unable to determine cause of rash.  She did states it burst and she had discharge on her underclothing   DM she has not been checking her sugars, feels weak at times, taking glipizide 7.5mg  BID, last A1C 7.7% a few months ago   Also reports that she has R foot pain. States that she had struck R foot and it is very discolored (purple) and tender. States that toes 3-5 are painful and swollen. States that no toes are dislocated. She was advised  X-ray before appt but she declined. Today, states the swelling has gone down, and it is not as sore, so she doesn't want to do anything with it right now .  COPD end-stage with chronic respiratory failure.  She is followed by pulmonary but often does not call their when she is having exacerbations.  States that she has increased congestion with mucus sore throat difficulty breathing.  She is wearing liters of oxygen.  She is on prednisone 10 mg once a day.  She has been very difficult to control and continues to smoke.  She is on Symbicort and Spiriva nebulizer as needed. Reviewed her last pulmonary May She did albuterol neb this AM but it didn't help  Review Of Systems:  GEN- + fatigue, denies  fever, weight loss,weakness, recent illness HEENT- denies eye drainage, change in vision, nasal discharge, CVS- denies chest pain, palpitations RESP- + SOB, + cough, +wheeze ABD- denies N/V, change in stools, abd pain GU- denies dysuria, hematuria, dribbling, incontinence MSK- de+ joint pain, muscle aches, injury Neuro- denies headache, dizziness, syncope, seizure activity       Objective:    BP  138/64   Pulse (!) 112   Temp 98.1 F (36.7 C) (Temporal)   Resp (!) 24   Ht 5\' 1"  (1.549 m)   Wt 182 lb (82.6 kg)   SpO2 94% Comment: 3Lmin via Day  BMI 34.39 kg/m  GEN- NAD, alert and oriented x3 HEENT- PERRL, EOMI, non injected sclera, pink conjunctiva, MMM, oropharynx clear Neck- Supple, no thyromegaly CVS- RR tachycardia, no murmur RESP- bilat wheeze, fair Air movement, s/p neb, improved air movement, rhonchi ABD-NABS,soft,NT,ND Skin left buttocks erythematou spatch with 3 pustular lesions, NT, no streaking EXT- No edema, right foot bruising 3-5th digits, +squeeze test mid foot with bruising, able to bear weight 2nd digit erythema ingrown nail/ partial nail removed, mild TTP, no abscess palpated Pulses- Radial, DP- 2+        Assessment & Plan:      Problem List Items Addressed This Visit      Unprioritized   Chronic hypoxemic respiratory failure ([place])    Patient here with another acute exacerbation.  She continues to smoke.  This is a continued cycle with her having flares every couple months.  Depo-Medrol 80 mg given in the office increase her prednisone to 60 mg and taper back down to 10 Augmentin given she has Mucinex and cough medicine.  She did well with DuoNeb in the office.  She has both of these nebs at home.      Diabetes mellitus without complication ([place])  Goal A1c less than 7%.  We will adjust her glipizide pending results.      Relevant Orders   Hemoglobin A1c   Essential hypertension    Controlled       Relevant Orders   Comprehensive metabolic panel   CBC with Differential/Platelet   Sinus tachycardia    Other Visit Diagnoses    Hematuria, unspecified type    -  Primary   Relevant Orders   Urinalysis, Routine w reflex microscopic (Completed)   Urine Culture   Weakness       COPD exacerbation (HCC)       Relevant Medications   ipratropium-albuterol (DUONEB) 0.5-2.5 (3) MG/3ML nebulizer solution 3 mL (Completed)   methylPREDNISolone  acetate (DEPO-MEDROL) injection 80 mg (Completed)   predniSONE (DELTASONE) 20 MG tablet   Injury of right foot, initial encounter       elevate foot, ICE, declines xray, no pain at toes    Infection of nail bed of toe of right foot       unclear cause, no drainage, topical antibiotic   Relevant Medications   mupirocin ointment (BACTROBAN) 2 %   Cellulitis of left buttock       Treat with augmentin, which can also cover UTI/COPD, Bactroban ointment to buttocks      Note: This dictation was prepared with Dragon dictation along with smaller phrase technology. Any transcriptional errors that result from this process are unintentional.

## 2020-12-17 NOTE — Assessment & Plan Note (Signed)
Patient here with another acute exacerbation.  She continues to smoke.  This is a continued cycle with her having flares every couple months.  Depo-Medrol 80 mg given in the office increase her prednisone to 60 mg and taper back down to 10 Augmentin given she has Mucinex and cough medicine.  She did well with DuoNeb in the office.  She has both of these nebs at home.

## 2020-12-17 NOTE — Patient Instructions (Addendum)
Take antibiotics as prescribed Prednisone taper given Use antibiotic ointment on your toe and buttocks Continue your nebulizers Keep appointment with your urologist F/U 3 MONTHS - 30 minute slot

## 2020-12-17 NOTE — Assessment & Plan Note (Signed)
Goal A1c less than 7%.  We will adjust her glipizide pending results.

## 2020-12-17 NOTE — Telephone Encounter (Signed)
Noted  

## 2020-12-17 NOTE — Assessment & Plan Note (Signed)
Controlled.  

## 2020-12-18 ENCOUNTER — Other Ambulatory Visit: Payer: Self-pay

## 2020-12-18 ENCOUNTER — Telehealth: Payer: Self-pay | Admitting: Family Medicine

## 2020-12-18 DIAGNOSIS — E119 Type 2 diabetes mellitus without complications: Secondary | ICD-10-CM

## 2020-12-18 DIAGNOSIS — I10 Essential (primary) hypertension: Secondary | ICD-10-CM

## 2020-12-18 LAB — CBC WITH DIFFERENTIAL/PLATELET
Absolute Monocytes: 2038 cells/uL — ABNORMAL HIGH (ref 200–950)
Basophils Absolute: 134 cells/uL (ref 0–200)
Basophils Relative: 0.6 %
Eosinophils Absolute: 112 cells/uL (ref 15–500)
Eosinophils Relative: 0.5 %
HCT: 41.8 % (ref 35.0–45.0)
Hemoglobin: 14 g/dL (ref 11.7–15.5)
Lymphs Abs: 5802 cells/uL — ABNORMAL HIGH (ref 850–3900)
MCH: 31.3 pg (ref 27.0–33.0)
MCHC: 33.5 g/dL (ref 32.0–36.0)
MCV: 93.3 fL (ref 80.0–100.0)
MPV: 12.1 fL (ref 7.5–12.5)
Monocytes Relative: 9.1 %
Neutro Abs: 14314 cells/uL — ABNORMAL HIGH (ref 1500–7800)
Neutrophils Relative %: 63.9 %
Platelets: 308 10*3/uL (ref 140–400)
RBC: 4.48 10*6/uL (ref 3.80–5.10)
RDW: 13 % (ref 11.0–15.0)
Total Lymphocyte: 25.9 %
WBC: 22.4 10*3/uL — ABNORMAL HIGH (ref 3.8–10.8)

## 2020-12-18 LAB — URINE CULTURE
MICRO NUMBER:: 11337842
SPECIMEN QUALITY:: ADEQUATE

## 2020-12-18 LAB — COMPREHENSIVE METABOLIC PANEL
AG Ratio: 1.8 (calc) (ref 1.0–2.5)
ALT: 31 U/L — ABNORMAL HIGH (ref 6–29)
AST: 27 U/L (ref 10–35)
Albumin: 4.2 g/dL (ref 3.6–5.1)
Alkaline phosphatase (APISO): 69 U/L (ref 37–153)
BUN/Creatinine Ratio: 10 (calc) (ref 6–22)
BUN: 12 mg/dL (ref 7–25)
CO2: 35 mmol/L — ABNORMAL HIGH (ref 20–32)
Calcium: 11.1 mg/dL — ABNORMAL HIGH (ref 8.6–10.4)
Chloride: 92 mmol/L — ABNORMAL LOW (ref 98–110)
Creat: 1.18 mg/dL — ABNORMAL HIGH (ref 0.50–0.99)
Globulin: 2.3 g/dL (calc) (ref 1.9–3.7)
Glucose, Bld: 93 mg/dL (ref 65–99)
Potassium: 3.7 mmol/L (ref 3.5–5.3)
Sodium: 142 mmol/L (ref 135–146)
Total Bilirubin: 0.4 mg/dL (ref 0.2–1.2)
Total Protein: 6.5 g/dL (ref 6.1–8.1)

## 2020-12-18 LAB — HEMOGLOBIN A1C
Hgb A1c MFr Bld: 8.1 % of total Hgb — ABNORMAL HIGH (ref <5.7)
Mean Plasma Glucose: 186 mg/dL
eAG (mmol/L): 10.3 mmol/L

## 2020-12-18 MED ORDER — GLIPIZIDE 5 MG PO TABS: 7.5000 mg | ORAL_TABLET | Freq: Two times a day (BID) | ORAL | 3 refills | Status: DC

## 2020-12-18 MED ORDER — SPIRONOLACTONE 25 MG PO TABS: 25.0000 mg | ORAL_TABLET | Freq: Every day | ORAL | 3 refills | Status: DC

## 2020-12-18 MED ORDER — LANCETS MISC: 11 refills | Status: DC

## 2020-12-18 NOTE — Telephone Encounter (Signed)
Sent medication to preferred pharmacy.

## 2020-12-18 NOTE — Telephone Encounter (Signed)
Refill Spironolactone 25 mg, Glucotrol 15 mg. L[place]  Please send Rx to [facility]  on [address] [place]

## 2020-12-19 ENCOUNTER — Encounter: Payer: Self-pay | Admitting: Emergency Medicine

## 2020-12-19 ENCOUNTER — Ambulatory Visit (INDEPENDENT_AMBULATORY_CARE_PROVIDER_SITE_OTHER): Payer: Medicare Other | Admitting: Emergency Medicine

## 2020-12-19 ENCOUNTER — Other Ambulatory Visit: Payer: Self-pay

## 2020-12-19 DIAGNOSIS — J411 Mucopurulent chronic bronchitis: Secondary | ICD-10-CM

## 2020-12-19 MED ORDER — PREDNISONE 10 MG PO TABS: 10.0000 mg | ORAL_TABLET | Freq: Every day | ORAL | 3 refills | Status: DC

## 2020-12-19 MED ORDER — GLIPIZIDE 10 MG PO TABS
10.0000 mg | ORAL_TABLET | Freq: Two times a day (BID) | ORAL | 1 refills | Status: DC
Start: 2020-12-19 — End: 2021-02-15

## 2020-12-19 NOTE — Progress Notes (Signed)
Virtual Visit via Telephone Note  I connected with Cindy [name] on 12/19/20 at  9:00 AM EST by telephone and verified that I am speaking with the correct person using two identifiers.  Location: Patient: home  Provider: office   I discussed the limitations, risks, security and privacy concerns of performing an evaluation and management service by telephone and the availability of in person appointments. I also discussed with the patient that there may be a patient responsible charge related to this service. The patient expressed understanding and agreed to proceed.   History of Present Illness: [name] is 26 and has a history of severe COPD with chronic bronchitic phenotype.  She is an active smoker.  She has associated chronic hypoxemic respiratory failure. Managed on chronic prednisone 10 mg daily, Spiriva, Symbicort   Observations/Objective: She was in the emergency department 10/20/2020 with an acute exacerbation. Then treated again 2 days ago, depomedrol and pred taper.  She wants to change her pred script over to [facility] She is starting to improve.   Assessment and Plan: Complete prednisone taper and Augmentin Continue Symbicort and Spiriva Albuterol as needed  Smoking cessation discussed COVID vaccine up to date, Flu shot Fluticasone nasal spray qd  Follow Up Instructions: 3 months with RB   I discussed the assessment and treatment plan with the patient. The patient was provided an opportunity to ask questions and all were answered. The patient agreed with the plan and demonstrated an understanding of the instructions.   The patient was advised to call back or seek an in-person evaluation if the symptoms worsen or if the condition fails to improve as anticipated.  I provided 18 minutes of non-face-to-face time during this encounter.   Collene Gobble, MD

## 2020-12-19 NOTE — Addendum Note (Signed)
Addended by: Vic Blackbird F on: 12/19/2020 02:05 PM   Modules accepted: Orders

## 2020-12-19 NOTE — Addendum Note (Signed)
Addended by: Lorretta Harp on: 12/19/2020 09:28 AM   Modules accepted: Orders

## 2020-12-20 ENCOUNTER — Telehealth: Payer: Self-pay | Admitting: Family Medicine

## 2020-12-20 NOTE — Telephone Encounter (Signed)
Wasnt sure how to take Glipizide please advise how to take her medication

## 2020-12-24 NOTE — Telephone Encounter (Signed)
Please see lab results for further information.  

## 2020-12-25 ENCOUNTER — Ambulatory Visit (INDEPENDENT_AMBULATORY_CARE_PROVIDER_SITE_OTHER): Payer: Medicare Other | Admitting: Urology

## 2020-12-25 ENCOUNTER — Other Ambulatory Visit: Payer: Self-pay

## 2020-12-25 ENCOUNTER — Encounter: Payer: Self-pay | Admitting: Urology

## 2020-12-25 VITALS — BP 138/78 | HR 121 | Temp 98.9°F | Ht 62.0 in | Wt 189.0 lb

## 2020-12-25 DIAGNOSIS — N200 Calculus of kidney: Secondary | ICD-10-CM

## 2020-12-25 DIAGNOSIS — R319 Hematuria, unspecified: Secondary | ICD-10-CM | POA: Diagnosis not present

## 2020-12-25 DIAGNOSIS — I2510 Atherosclerotic heart disease of native coronary artery without angina pectoris: Secondary | ICD-10-CM

## 2020-12-25 LAB — URINALYSIS, ROUTINE W REFLEX MICROSCOPIC
Bilirubin, UA: NEGATIVE
Glucose, UA: NEGATIVE
Ketones, UA: NEGATIVE
Nitrite, UA: NEGATIVE
Specific Gravity, UA: 1.015 (ref 1.005–1.030)
Urobilinogen, Ur: 0.2 mg/dL (ref 0.2–1.0)
pH, UA: 6.5 (ref 5.0–7.5)

## 2020-12-25 LAB — MICROSCOPIC EXAMINATION
Epithelial Cells (non renal): >10 /hpf — AB (ref 0–10)
Renal Epithel, UA: NONE SEEN /hpf

## 2020-12-25 NOTE — Progress Notes (Signed)
Urological Symptom Review  Patient is experiencing the following symptoms: Blood in urine   Review of Systems  Gastrointestinal (upper)  : Negative for upper GI symptoms  Gastrointestinal (lower) : Negative for lower GI symptoms  Constitutional : Negative for symptoms  Skin: Negative for skin symptoms  Eyes: Negative for eye symptoms  Ear/Nose/Throat : Negative for Ear/Nose/Throat symptoms  Hematologic/Lymphatic: Negative for Hematologic/Lymphatic symptoms  Cardiovascular : Leg swelling  Respiratory : Shortness of breath  Endocrine: Negative for endocrine symptoms  Musculoskeletal: Back pain Joint pain  Neurological: Negative for neurological symptoms  Psychologic: Negative for psychiatric symptoms

## 2020-12-25 NOTE — Progress Notes (Signed)
H&P  Chief Complaint: Gross Hematuria  History of Present Illness: Cindy [name] is a 64 y.o. year old female here for referral of her gross hematuria. Pt has a history of kidney stones and was last seen at [facility] in 2018.  She reports onset of intermittent gross hematuria in Sept. (last episode was 3-4 weeks prior) She denies any pain or kidney stone related symptoms associated with these episodes. She notes intermittent back pain and a "heavy feeling" in her bladder with urination. She feels unable to completely empty her bladder.  Pulmonary situation worsening--now on prednisone.   Past Medical History:  Diagnosis Date  . 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 Arthritis   . CAD (coronary artery disease)    a. NSTEMI 07/2018 s/p difficult PCI with overlapping DES to LAD, residual OM2 disease treated medically.  . Chronic diastolic CHF (congestive heart failure) (HCC)   . Chronic pain   . Chronic respiratory failure (HCC) On home 02 2-3L  . Compression fracture of lumbar vertebra (HCC)   . COPD (chronic obstructive pulmonary disease) (HCC)   . Essential hypertension   . Hyperglycemia, drug-induced   . Hyperlipidemia   . Kidney stones 2014   Left side, multiple  . Leukocytosis   . Myocardial infarct (HCC)   . Osteoporosis 2013  . Thyroid disease    pt denies  . Tobacco abuse     Past Surgical History:  Procedure Laterality Date  . CERVICAL BIOPSY    . COLONOSCOPY N/A 03/31/2013   Procedure: COLONOSCOPY;  Surgeon: Malissa HippoNajeeb U Rehman, MD;  Location: [facility];  Service: Endoscopy;  Laterality: N/A;  730  . CORONARY ATHERECTOMY N/A 08/19/2018   Procedure: CORONARY ATHERECTOMY;  Surgeon: Yvonne KendallEnd, Christopher, MD;  Location: [facility];  Service: Cardiovascular;  Laterality: N/A;  . CORONARY STENT INTERVENTION N/A 08/19/2018   Procedure: CORONARY STENT INTERVENTION;  Surgeon: Yvonne KendallEnd, Christopher, MD;  Location: [facility];  Service: Cardiovascular;  Laterality: N/A;  .  INTRAVASCULAR ULTRASOUND/IVUS N/A 08/19/2018   Procedure: Intravascular Ultrasound/IVUS;  Surgeon: Yvonne KendallEnd, Christopher, MD;  Location: [facility];  Service: Cardiovascular;  Laterality: N/A;  . LEFT HEART CATH N/A 08/19/2018   Procedure: Left Heart Cath;  Surgeon: Yvonne KendallEnd, Christopher, MD;  Location: [facility];  Service: Cardiovascular;  Laterality: N/A;  . LEFT HEART CATH AND CORONARY ANGIOGRAPHY N/A 08/16/2018   Procedure: LEFT HEART CATH AND CORONARY ANGIOGRAPHY;  Surgeon: Yvonne KendallEnd, Christopher, MD;  Location: [facility];  Service: Cardiovascular;  Laterality: N/A;  . TUBAL LIGATION      Home Medications:  (Not in a hospital admission)   Allergies:  Allergies  Allergen Reactions  . Keflex [Cephalexin] Anaphylaxis and Rash  . Sulfa Antibiotics   . Avelox [Moxifloxacin Hcl In Nacl] Itching and Rash  . Toradol [Ketorolac Tromethamine] Swelling, Rash and Other (See Comments)    Bruising, swelling, rash at injection site    Family History  Problem Relation Age of Onset  . Heart disease Mother   . Hypertension Sister   . Hypertension Brother     Social History:  reports that she has been smoking cigarettes. She has a 25.00 pack-year smoking history. She has never used smokeless tobacco. She reports that she does not drink alcohol and does not use drugs.  ROS: A complete review of systems was performed.  All systems are negative except for pertinent findings as noted.  Physical Exam:  Vital signs in last 24 hours:  General:  Alert and oriented, No acute distress. Frail, chronically-ill appearing. HEENT: Normocephalic, atraumatic Neck: No JVD Cardiovascular: Regular rate  Lungs: Pt on 3 liters of Oxygen. Neurologic: Grossly intact  Laboratory Data:  No results found for this or any previous visit (from the past 24 hour(s)). Recent Results (from the past 240 hour(s))  Urine Culture     Status: None   Collection Time: 12/17/20 11:12 AM   Specimen: Urine  Result  Value Ref Range Status   MICRO NUMBER: [id number]  Final   SPECIMEN QUALITY: Adequate  Final   Sample Source NOT GIVEN  Final   STATUS: FINAL  Final   ISOLATE 1:   Final    Growth of mixed flora was isolated, suggesting probable contamination. No further testing will be performed. If clinically indicated, recollection using a method to minimize contamination, with prompt transfer to Urine Culture Transport Tube, is  recommended.    I have reviewed prior pt notes  I have reviewed notes from referring/previous physicians  I have reviewed urinalysis results  No recent imaging available  I have reviewed prior urine culture  Impression/Assessment:  Gross hematuria: Pt's stone disease was noted to be progressive at her last appointment in 2018. Her gross hematuria is thought to be caused by kidney stones and she will be scheduled for a f/u CT to assess her stone burden.  Plan:  1. Will F/U w/ pt following CT scan.  CC: Dr. Milinda Antis  Kenney Houseman 12/25/2020, 9:18 AM  Bertram Millard. Santasia Rew MD

## 2020-12-26 ENCOUNTER — Other Ambulatory Visit: Payer: Self-pay | Admitting: *Deleted

## 2020-12-26 DIAGNOSIS — Z87440 Personal history of urinary (tract) infections: Secondary | ICD-10-CM

## 2020-12-26 DIAGNOSIS — N390 Urinary tract infection, site not specified: Secondary | ICD-10-CM

## 2020-12-26 MED ORDER — ATORVASTATIN CALCIUM 80 MG PO TABS: 80.0000 mg | ORAL_TABLET | Freq: Every evening | ORAL | 3 refills | Status: DC

## 2021-01-15 ENCOUNTER — Other Ambulatory Visit: Payer: Self-pay | Admitting: Cardiology

## 2021-01-15 ENCOUNTER — Other Ambulatory Visit: Payer: Self-pay | Admitting: Family Medicine

## 2021-01-15 ENCOUNTER — Telehealth: Payer: Self-pay | Admitting: Cardiology

## 2021-01-15 ENCOUNTER — Telehealth: Payer: Self-pay | Admitting: Family Medicine

## 2021-01-15 MED ORDER — EZETIMIBE 10 MG PO TABS: 10.0000 mg | ORAL_TABLET | Freq: Every day | ORAL | 1 refills | Status: DC

## 2021-01-15 MED ORDER — LINACLOTIDE 145 MCG PO CAPS: ORAL_CAPSULE | ORAL | 1 refills | Status: DC

## 2021-01-15 MED ORDER — BUDESONIDE-FORMOTEROL FUMARATE 160-4.5 MCG/ACT IN AERO: 2.0000 | INHALATION_SPRAY | Freq: Two times a day (BID) | RESPIRATORY_TRACT | 11 refills | Status: DC

## 2021-01-15 MED ORDER — EZETIMIBE 10 MG PO TABS: 10.0000 mg | ORAL_TABLET | Freq: Every day | ORAL | 0 refills | Status: DC

## 2021-01-15 NOTE — Telephone Encounter (Signed)
Medications refilled

## 2021-01-15 NOTE — Telephone Encounter (Signed)
     1. Which medications need to be refilled? (please list name of each medication and dose if known) Zetia 10 mg   2. Which pharmacy/location (including street and city if local pharmacy) is medication to be sent to? [place], [place]   3. Do they need a 30 day or 90 day supply? [place]

## 2021-01-15 NOTE — Telephone Encounter (Signed)
Cb# [phone]  Patient requesting refills called into [facility] on [address] [place]  SYMBICORT 160-4.5 MCG/ACT inhaler   ezepimibe 10mg  90 pills this was prescribed by a retired physician name Dr. Court Joy (heart doctor)  Rolan Lipa 145 MCG CAPS capsule

## 2021-01-18 ENCOUNTER — Ambulatory Visit (HOSPITAL_COMMUNITY): Admission: RE | Admit: 2021-01-18 | Payer: Medicare Other | Source: Ambulatory Visit

## 2021-01-23 ENCOUNTER — Ambulatory Visit (INDEPENDENT_AMBULATORY_CARE_PROVIDER_SITE_OTHER): Payer: Medicare Other | Admitting: Nurse Practitioner

## 2021-01-23 ENCOUNTER — Other Ambulatory Visit: Payer: Self-pay

## 2021-01-23 ENCOUNTER — Ambulatory Visit: Payer: Medicare Other

## 2021-01-23 ENCOUNTER — Other Ambulatory Visit: Payer: Medicare Other

## 2021-01-23 ENCOUNTER — Encounter: Payer: Self-pay | Admitting: Nurse Practitioner

## 2021-01-23 VITALS — HR 112 | Temp 98.1°F | Resp 26

## 2021-01-23 DIAGNOSIS — N390 Urinary tract infection, site not specified: Secondary | ICD-10-CM | POA: Diagnosis not present

## 2021-01-23 DIAGNOSIS — Z87440 Personal history of urinary (tract) infections: Secondary | ICD-10-CM | POA: Diagnosis not present

## 2021-01-23 DIAGNOSIS — J441 Chronic obstructive pulmonary disease with (acute) exacerbation: Secondary | ICD-10-CM

## 2021-01-23 MED ORDER — PREDNISONE 20 MG PO TABS
ORAL_TABLET | ORAL | 0 refills | Status: DC
Start: 2021-01-23 — End: 2021-02-15

## 2021-01-23 MED ORDER — IPRATROPIUM-ALBUTEROL 0.5-2.5 (3) MG/3ML IN SOLN
3.0000 mL | Freq: Once | RESPIRATORY_TRACT | Status: AC
  Administered 2021-01-23: 3 mL via RESPIRATORY_TRACT

## 2021-01-23 MED ORDER — METHYLPREDNISOLONE ACETATE 80 MG/ML IJ SUSP
80.0000 mg | Freq: Once | INTRAMUSCULAR | Status: AC
  Administered 2021-01-23: 80 mg via INTRAMUSCULAR

## 2021-01-23 MED ORDER — DOXYCYCLINE HYCLATE 100 MG PO TABS: 100.0000 mg | ORAL_TABLET | Freq: Two times a day (BID) | ORAL | 0 refills | Status: DC

## 2021-01-23 NOTE — Progress Notes (Addendum)
Subjective:    Patient ID: Cindy [name], female    DOB: 02/11/1956, 65 y.o.   MRN: [id number]  HPI: Cindy [name] is a 65 y.o. female presenting for difficulty breathing.  Chief Complaint  Patient presents with   COPD Exacerbation    X5 days- SOB, cough, L ear pain, nasal congestion, nasal sores   SHORTNESS OF BREATH Is having worse nasal congestion. Has been using nasal spray along with ointment in her nose. Patient reports she needs a "breathing treatment and a shot"  Also asking for a prednisone taper. Duration: years;  Onset: last week worsening Description of breathing discomfort: tightness Severity: severe Episode duration: days Frequency: all day Related to exertion: yes Cough: yes; productive Chest tightness: yes Wheezing: yes Fevers: no Chest pain: no Palpitations: yes  Nausea: no Diaphoresis: no Deconditioning: no Status worse Aggravating factors: activity, getting hot Alleviating factors: nothing Treatments attempted: flonase, saline, at home nebulizer, rescue inhaler  Allergies  Allergen Reactions   Keflex [Cephalexin] Anaphylaxis and Rash   Sulfa Antibiotics    Avelox [Moxifloxacin Hcl In Nacl] Itching and Rash   Toradol [Ketorolac Tromethamine] Swelling, Rash and Other (See Comments)    Bruising, swelling, rash at injection site    Outpatient Encounter Medications as of 01/23/2021  Medication Sig   albuterol (PROVENTIL) (2.5 MG/3ML) 0.083% nebulizer solution INHALE 1 VIAL VIA NEBULIZER EVERY 4 HOURS AS NEEDED.   albuterol (VENTOLIN HFA) 108 (90 Base) MCG/ACT inhaler Inhale 2 puffs into the lungs every 4 (four) hours as needed for wheezing or shortness of breath.   alendronate (FOSAMAX) 70 MG tablet TAKE 1 TAB BY MOUTH EVERY WEEK ON AN EMPTY STOMACH.   aspirin EC 81 MG tablet Take 81 mg by mouth every morning.   atorvastatin (LIPITOR) 80 MG tablet TAKE 1 TABLET BY MOUTH DAILY   Blood Glucose Monitoring Suppl (BLOOD GLUCOSE SYSTEM PAK)  KIT Please dispense based on patient and insurance preference. Use as directed to monitor FSBS 1x daily. Dx: E11.9.   budesonide-formoterol (SYMBICORT) 160-4.5 MCG/ACT inhaler Inhale 2 puffs into the lungs 2 (two) times daily.   Calcium Carb-Cholecalciferol 600-800 MG-UNIT TABS Take 1 tablet by mouth 2 (two) times daily.    clopidogrel (PLAVIX) 75 MG tablet TAKE ONE TABLET BY MOUTH ONCE DAILY.   clotrimazole-betamethasone (LOTRISONE) cream APPLY TO AFFECTED AREA TWICE DAILY.   Diphenhyd-Hydrocort-Nystatin (FIRST-DUKES MOUTHWASH) SUSP , swish 52m TID prn pain x 2 weeks   diphenhydrAMINE (BENADRYL) 12.5 MG/5ML liquid Take 6.25 mg by mouth 4 (four) times daily as needed.   doxycycline (VIBRA-TABS) 100 MG tablet Take 1 tablet (100 mg total) by mouth 2 (two) times daily.   ezetimibe (ZETIA) 10 MG tablet Take 1 tablet (10 mg total) by mouth daily.   fluticasone (FLONASE) 50 MCG/ACT nasal spray INSTILL 1 SPRAY INTO EACH NOSTRIL DAILY AS NEEDED FOR ALLERGIES.   furosemide (LASIX) 40 MG tablet ALTERNATE TAKING 2 TABLETS BY MOUTH ONE DAY, THEN 1 TABLET THE NEXT DAY. REPEAT.   glipiZIDE (GLUCOTROL) 10 MG tablet Take 1 tablet (10 mg total) by mouth 2 (two) times daily before a meal.   Glucose Blood (BLOOD GLUCOSE TEST STRIPS) STRP Please dispense based on patient and insurance preference. Use as directed to monitor FSBS 1x daily. Dx: E11.9.   guaiFENesin-codeine 100-10 MG/5ML syrup Take 5 mLs by mouth every 6 (six) hours as needed.   ipratropium (ATROVENT) 0.03 % nasal spray Place 2 sprays into both nostrils every 12 (twelve) hours.  Lancets MISC Please dispense based on patient and insurance preference. Use as directed to monitor FSBS 1x daily. Dx: E11.9.   linaclotide (LINZESS) 145 MCG CAPS capsule TAKE 1 CAPSULE BY MOUTH BEFORE BREAKFAST.   Magnesium 250 MG TABS Take 1 tablet daily   methocarbamol (ROBAXIN) 500 MG tablet Take 1 tablet (500 mg total) by mouth every 8 (eight) hours as  needed for muscle spasms.   metoprolol tartrate (LOPRESSOR) 25 MG tablet TAKE 1 TABLET BY MOUTH TWICE DAILY.   mupirocin ointment (BACTROBAN) 2 % Apply to affected areas on buttocks and toe twice a day for 1 week   nitroGLYCERIN (NITROSTAT) 0.4 MG SL tablet Place 1 tablet (0.4 mg total) under the tongue every 5 (five) minutes as needed for chest pain. If no improvement with 3 doses call 911   nystatin (MYCOSTATIN) 100000 UNIT/ML suspension Take 5 mLs (500,000 Units total) by mouth 4 (four) times daily.   nystatin (MYCOSTATIN/NYSTOP) powder APPLY TO THE AFFECTED AREAS FOUR TIMES DAILY.   oxyCODONE-acetaminophen (PERCOCET) 5-325 MG tablet Take 1 tablet by mouth every 6 (six) hours as needed for severe pain.   OXYGEN Inhale 3 L into the lungs continuous.    pantoprazole (PROTONIX) 40 MG tablet TAKE ONE TABLET BY MOUTH TWICE A DAY   polyethylene glycol (MIRALAX / GLYCOLAX) packet Take 17 g by mouth daily. (Patient taking differently: Take 17 g by mouth daily as needed for mild constipation or moderate constipation.)   predniSONE (DELTASONE) 10 MG tablet Take 1 tablet (10 mg total) by mouth daily with breakfast.   SPIRIVA RESPIMAT 2.5 MCG/ACT AERS INHALE 2 PUFFS INTO THE LUNGS ONCE DAILY.   spironolactone (ALDACTONE) 25 MG tablet Take 1 tablet (25 mg total) by mouth daily.   potassium chloride SA (KLOR-CON) 20 MEQ tablet Take 1 tablet (20 mEq total) by mouth daily for 3 days.   predniSONE (DELTASONE) 20 MG tablet Take 37m x 3 days, then 477m 3 days, then 2029m 3 days   [DISCONTINUED] amoxicillin-clavulanate (AUGMENTIN) 875-125 MG tablet Take 1 tablet by mouth 2 (two) times daily.   [DISCONTINUED] predniSONE (DELTASONE) 20 MG tablet Take 30m20m3 days, then 40mg88mdays, then 20mg 30mdays (Patient not taking: Reported on 01/23/2021)   No facility-administered encounter medications on file as of 01/23/2021.    Patient Active Problem List   Diagnosis Date Noted   Stucco keratoses  09/27/2019   Diabetes mellitus without complication (HCC) 0[place]4/36/14/4315onic pain 11/18/2018   CAD (coronary artery disease) 10/05/2018   Chronic diastolic CHF (congestive heart failure) (HCC)    S/P PTCA (percutaneous transluminal coronary angioplasty)    Orthopnea    Non-ST elevation (NSTEMI) myocardial infarction (HCC) [facility]    Bilateral leg edema 08/11/2018   Abnormal EKG 08/12/39/08/6761stolic dysfunction 08/11/94/09/3267monary hypertension (HCC) 0[place]4/2017   Leg swelling 04/15/2016   Rash and nonspecific skin eruption 08/15/2014   Ureteral stone with hydronephrosis 05/30/2013   Allergic rhinitis 04/11/2013   Kidney stones 04/11/2013   Lumbar back pain 03/03/2013   Compression fracture of L3 lumbar vertebra 03/03/2013   Constipation 03/03/2013   Sinus tachycardia 06/27/2012   Osteoporosis 03/16/2012   GERD (gastroesophageal reflux disease) 03/16/2012   Thrush 03/02/2012   Chronic hypoxemic respiratory failure (HCC) 0[place]1/2013   Hyperlipidemia 02/25/2012   Tobacco abuse 09/24/2011   COPD (chronic obstructive pulmonary disease) (HCC) 1[place]7/2010    Past Medical History:  Diagnosis Date  Adrenal adenoma 2014   Left   Arthritis    CAD (coronary artery disease)    a. NSTEMI 07/2018 s/p difficult PCI with overlapping DES to LAD, residual OM2 disease treated medically.   Chronic diastolic CHF (congestive heart failure) (HCC)    Chronic pain    Chronic respiratory failure ([place]) On home 02 2-3L   Compression fracture of lumbar vertebra (HCC)    COPD (chronic obstructive pulmonary disease) ([place])    Essential hypertension    Hyperglycemia, drug-induced    Hyperlipidemia    Kidney stones 2014   Left side, multiple   Leukocytosis    Myocardial infarct [facility])    Osteoporosis 2013   Thyroid disease    pt denies   Tobacco abuse     Relevant past medical, surgical, family and social history reviewed and  updated as indicated. Interim medical history since our last visit reviewed.  Review of Systems Per HPI unless specifically indicated above     Objective:    Pulse (!) 112    Temp 98.1 F (36.7 C) (Temporal)    Resp (!) 26    SpO2 96% Comment: 3L/ min via [place]  Wt Readings from Last 3 Encounters:  12/25/20 189 lb (85.7 kg)  12/17/20 182 lb (82.6 kg)  10/20/20 190 lb (86.2 kg)    Physical Exam Vitals and nursing note reviewed.  Constitutional:      General: She is not in acute distress.    Appearance: Normal appearance. She is not toxic-appearing or diaphoretic.  HENT:     Head: Normocephalic and atraumatic.     Right Ear: External ear normal.     Left Ear: External ear normal.     Mouth/Throat:     Mouth: Mucous membranes are moist.     Pharynx: Posterior oropharyngeal erythema present. No oropharyngeal exudate.  Eyes:     General: No scleral icterus.    Extraocular Movements: Extraocular movements intact.  Cardiovascular:     Rate and Rhythm: Tachycardia present.     Heart sounds: Normal heart sounds.  Pulmonary:     Effort: No respiratory distress.     Breath sounds: No stridor. Wheezing, rhonchi and rales present.  Chest:     Chest wall: No tenderness.  Musculoskeletal:     Cervical back: Normal range of motion and neck supple. No tenderness.  Skin:    General: Skin is warm and dry.     Capillary Refill: Capillary refill takes less than 2 seconds.     Coloration: Skin is not jaundiced or pale.     Findings: No erythema.  Neurological:     Mental Status: She is alert and oriented to person, place, and time.  Psychiatric:        Mood and Affect: Mood normal.        Behavior: Behavior normal.        Thought Content: Thought content normal.        Judgment: Judgment normal.       Assessment & Plan:  1. COPD exacerbation (HCC) Acute, ongoing x 5 days.  After review of patient's records, appears patient has frequent exacerbations every couple of months.  Also uses  oxygen and is end-stage COPD.  COVID swab obtained to determine if COVID-19 was trigger.  Depo-Medrol 80 mg and DuoNeb given in office.  Oxygen saturation above 90% and patient non-toxic.  Will start prednisone taper and doxycycline course.  Encouraged patient to take albuterol nebulizer at home and return to  clinic if symptoms do not gradually improve.  With any worsening symptoms, go to ER.  - SARS-COV-2 RNA,(COVID-19) QUAL NAAT - predniSONE (DELTASONE) 20 MG tablet; Take 9m x 3 days, then 423m 3 days, then 2058m 3 days  Dispense: 18 tablet; Refill: 0 - doxycycline (VIBRA-TABS) 100 MG tablet; Take 1 tablet (100 mg total) by mouth 2 (two) times daily.  Dispense: 14 tablet; Refill: 0    Follow up plan: Return if symptoms worsen or fail to improve.

## 2021-01-24 LAB — PTH, INTACT AND CALCIUM
Calcium: 9.7 mg/dL (ref 8.6–10.4)
PTH: 93 pg/mL — ABNORMAL HIGH (ref 14–64)

## 2021-01-24 LAB — URINALYSIS, ROUTINE W REFLEX MICROSCOPIC
Bilirubin Urine: NEGATIVE
Glucose, UA: NEGATIVE
Hyaline Cast: NONE SEEN /LPF
Ketones, ur: NEGATIVE
Nitrite: NEGATIVE
Specific Gravity, Urine: 1.011 (ref 1.001–1.03)
pH: 6.5 (ref 5.0–8.0)

## 2021-01-24 LAB — URINE CULTURE
MICRO NUMBER:: 11459673
Result:: NO GROWTH
SPECIMEN QUALITY:: ADEQUATE

## 2021-01-24 LAB — VITAMIN D 25 HYDROXY (VIT D DEFICIENCY, FRACTURES): Vit D, 25-Hydroxy: 39 ng/mL (ref 30–100)

## 2021-01-25 ENCOUNTER — Encounter: Payer: Self-pay | Admitting: Family Medicine

## 2021-01-25 ENCOUNTER — Telehealth: Payer: Self-pay | Admitting: *Deleted

## 2021-01-25 DIAGNOSIS — N2581 Secondary hyperparathyroidism of renal origin: Secondary | ICD-10-CM | POA: Insufficient documentation

## 2021-01-25 LAB — SARS-COV-2 RNA,(COVID-19) QUALITATIVE NAAT: SARS CoV2 RNA: NOT DETECTED

## 2021-01-25 MED ORDER — FLUCONAZOLE 150 MG PO TABS: ORAL_TABLET | ORAL | 0 refills | Status: DC

## 2021-01-25 NOTE — Addendum Note (Signed)
Addended by: Vic Blackbird F on: 01/25/2021 05:03 PM   Modules accepted: Orders

## 2021-01-25 NOTE — Progress Notes (Signed)
Diflucan sent

## 2021-01-25 NOTE — Telephone Encounter (Signed)
Received call from patient.  Requested results of labs.   Advised COVID test resulted and is negative. Advised that blood work has not been reviewed at this time.

## 2021-01-28 ENCOUNTER — Telehealth: Payer: Self-pay | Admitting: Family Medicine

## 2021-01-28 MED ORDER — ALBUTEROL SULFATE HFA 108 (90 BASE) MCG/ACT IN AERS: 2.0000 | INHALATION_SPRAY | RESPIRATORY_TRACT | 1 refills | Status: DC | PRN

## 2021-01-28 MED ORDER — ALBUTEROL SULFATE (2.5 MG/3ML) 0.083% IN NEBU: 2.5000 mg | INHALATION_SOLUTION | RESPIRATORY_TRACT | 0 refills | Status: DC | PRN

## 2021-01-28 NOTE — Telephone Encounter (Signed)
Refill Albuterol,

## 2021-01-28 NOTE — Telephone Encounter (Signed)
Prescription sent to pharmacy.

## 2021-01-29 ENCOUNTER — Telehealth: Payer: Self-pay

## 2021-01-29 NOTE — Telephone Encounter (Signed)
Patient called with gross hematuria. Pt has CT abd without scheduled for 2/26  Called and spoke with centralized scheduling and Anderson Malta in [place]. Pt will go 01/31/2021 at 10am for CT. Pt voiced understanding of new date and time.

## 2021-01-30 ENCOUNTER — Other Ambulatory Visit: Payer: Self-pay | Admitting: *Deleted

## 2021-01-30 MED ORDER — ALBUTEROL SULFATE (2.5 MG/3ML) 0.083% IN NEBU: 2.5000 mg | INHALATION_SOLUTION | RESPIRATORY_TRACT | 0 refills | Status: DC | PRN

## 2021-01-31 ENCOUNTER — Ambulatory Visit (HOSPITAL_COMMUNITY)
Admission: RE | Admit: 2021-01-31 | Discharge: 2021-01-31 | Disposition: A | Discharge status: Home / Self Care | Payer: Medicare Other | Source: Ambulatory Visit | Attending: Urology | Admitting: Urology

## 2021-01-31 ENCOUNTER — Other Ambulatory Visit: Payer: Self-pay

## 2021-01-31 DIAGNOSIS — R319 Hematuria, unspecified: Secondary | ICD-10-CM | POA: Diagnosis not present

## 2021-01-31 DIAGNOSIS — N200 Calculus of kidney: Secondary | ICD-10-CM | POA: Diagnosis not present

## 2021-01-31 DIAGNOSIS — D3500 Benign neoplasm of unspecified adrenal gland: Secondary | ICD-10-CM | POA: Diagnosis not present

## 2021-01-31 DIAGNOSIS — I700 Atherosclerosis of aorta: Secondary | ICD-10-CM | POA: Diagnosis not present

## 2021-01-31 DIAGNOSIS — K5730 Diverticulosis of large intestine without perforation or abscess without bleeding: Secondary | ICD-10-CM | POA: Diagnosis not present

## 2021-02-04 ENCOUNTER — Telehealth: Payer: Self-pay

## 2021-02-06 ENCOUNTER — Telehealth: Payer: Self-pay

## 2021-02-06 NOTE — Telephone Encounter (Signed)
Please call pt--CT showed stable stones in kidneys, but no stones passing

## 2021-02-07 NOTE — Telephone Encounter (Signed)
Pls see

## 2021-02-07 NOTE — Telephone Encounter (Signed)
Patient called and made aware and would like to know what you plan to do about the stones.

## 2021-02-11 NOTE — Telephone Encounter (Signed)
Done

## 2021-02-11 NOTE — Telephone Encounter (Signed)
Since the stones are in her kidneys, stable, and not causing symptoms, and because of her significant medical issues, we will not do anything about these at the present time.

## 2021-02-13 ENCOUNTER — Other Ambulatory Visit: Payer: Self-pay | Admitting: Family Medicine

## 2021-02-13 ENCOUNTER — Telehealth: Payer: Self-pay | Admitting: Family Medicine

## 2021-02-13 DIAGNOSIS — I10 Essential (primary) hypertension: Secondary | ICD-10-CM

## 2021-02-13 MED ORDER — CLOPIDOGREL BISULFATE 75 MG PO TABS: 75.0000 mg | ORAL_TABLET | Freq: Every day | ORAL | 3 refills | Status: DC

## 2021-02-13 MED ORDER — SPIRONOLACTONE 25 MG PO TABS: 25.0000 mg | ORAL_TABLET | Freq: Every day | ORAL | 3 refills | Status: DC

## 2021-02-13 MED ORDER — METOPROLOL TARTRATE 25 MG PO TABS: 25.0000 mg | ORAL_TABLET | Freq: Two times a day (BID) | ORAL | 3 refills | Status: AC

## 2021-02-13 NOTE — Telephone Encounter (Signed)
Prescription sent to pharmacy.

## 2021-02-13 NOTE — Telephone Encounter (Signed)
Pt called needing a refill of these meds clopidogrel (PLAVIX) 75 MG tablet metoprolol tartrate (LOPRESSOR) 25 MG tablet  spironolactone (ALDACTONE) 25 MG tablet. cb # [phone]

## 2021-02-13 NOTE — Telephone Encounter (Signed)
Pt called very upset about having to find a new dr. Rosanna Randy to know why she couldn't be seen by any other provider at this office. She wants to hear from someone as soon as possible as she does not want to find a new a dr.

## 2021-02-13 NOTE — Telephone Encounter (Signed)
Received call from patient.   Reports that letter from was received from PCP in regards to her leaving office. Inquired as to how they can establish within office. Advised that Dr Dennard Schaumann is unable to accept new patients at this time.   PCP advised to call one of the providers on the list with the letter to establish.   Verbalized understanding.

## 2021-02-14 ENCOUNTER — Telehealth: Payer: Self-pay | Admitting: Family Medicine

## 2021-02-14 NOTE — Telephone Encounter (Signed)
Patient called and made aware.

## 2021-02-14 NOTE — Telephone Encounter (Signed)
Let pt know that her stones appear the same as they were in 2020 on CT--have her come in for cysto

## 2021-02-14 NOTE — Telephone Encounter (Signed)
Error

## 2021-02-15 ENCOUNTER — Other Ambulatory Visit: Payer: Self-pay

## 2021-02-15 ENCOUNTER — Telehealth (INDEPENDENT_AMBULATORY_CARE_PROVIDER_SITE_OTHER): Payer: Medicare Other | Admitting: Cardiovascular Disease

## 2021-02-15 ENCOUNTER — Encounter: Payer: Self-pay | Admitting: Cardiovascular Disease

## 2021-02-15 VITALS — Ht 62.5 in | Wt 189.0 lb

## 2021-02-15 DIAGNOSIS — I10 Essential (primary) hypertension: Secondary | ICD-10-CM | POA: Diagnosis not present

## 2021-02-15 DIAGNOSIS — J431 Panlobular emphysema: Secondary | ICD-10-CM | POA: Diagnosis not present

## 2021-02-15 DIAGNOSIS — E785 Hyperlipidemia, unspecified: Secondary | ICD-10-CM

## 2021-02-15 DIAGNOSIS — I2510 Atherosclerotic heart disease of native coronary artery without angina pectoris: Secondary | ICD-10-CM

## 2021-02-15 DIAGNOSIS — I350 Nonrheumatic aortic (valve) stenosis: Secondary | ICD-10-CM | POA: Diagnosis not present

## 2021-02-15 NOTE — Patient Instructions (Signed)
Medication Instructions:  Your physician recommends that you continue on your current medications as directed. Please refer to the Current Medication list given to you today.  *If you need a refill on your cardiac medications before your next appointment, please call your pharmacy*   Lab Work: None today If you have labs (blood work) drawn today and your tests are completely normal, you will receive your results only by: Marland Kitchen MyChart Message (if you have MyChart) OR . A paper copy in the mail If you have any lab test that is abnormal or we need to change your treatment, we will call you to review the results.   Testing/Procedures: Your physician has requested that you have an echocardiogram. Echocardiography is a painless test that uses sound waves to create images of your heart. It provides your doctor with information about the size and shape of your heart and how well your heart's chambers and valves are working. This procedure takes approximately one hour. There are no restrictions for this procedure.     Follow-Up: At [facility], you and your health needs are our priority.  As part of our continuing mission to provide you with exceptional heart care, we have created designated Provider Care Teams.  These Care Teams include your primary Cardiologist (physician) and Advanced Practice Providers (APPs -  Physician Assistants and Nurse Practitioners) who all work together to provide you with the care you need, when you need it.  We recommend signing up for the patient portal called "MyChart".  Sign up information is provided on this After Visit Summary.  MyChart is used to connect with patients for Virtual Visits (Telemedicine).  Patients are able to view lab/test results, encounter notes, upcoming appointments, etc.  Non-urgent messages can be sent to your provider as well.   To learn more about what you can do with MyChart, go to [email or web address].    Your next appointment:   6  month(s)  The format for your next appointment:   In Person  Provider:   Jenkins Rouge, MD   Other Instructions None     Thank you for choosing [facility] !

## 2021-02-15 NOTE — Progress Notes (Addendum)
Virtual Visit via Video Note   This visit type was conducted due to national recommendations for restrictions regarding the COVID-19 Pandemic (e.g. social distancing) in an effort to limit this patient's exposure and mitigate transmission in our community.  Due to her co-morbid illnesses, this patient is at least at moderate risk for complications without adequate follow up.  This format is felt to be most appropriate for this patient at this time.  All issues noted in this document were discussed and addressed.  A limited physical exam was performed with this format.  Please refer to the patient's chart for her consent to telehealth for [facility].   Patient location: home Physician location: office   Primary Physician: Alycia Rossetti, MD Primary Cardiologist: Lenis Noon to me  Reason for Consultation: CAD/Dyspnea/AS    HPI:  65 y.o. with history of oxygen dependent COPD, HTN, HLD, smoking and CAD. SEMI 08/11/18 Echo with  EF 55% mild AS. Cath with complex calcified ostial/proximal LAD stenosis stented with atherectomy and IVUS Residual 60-70% OM2 disease She has kidney stones being followed by Dr Eulogio Ditch CT 01/31/21 with multiple on obstructing renal calculi no hydronephrosis   No angina Chronic dyspnea from COPD   ROS All other systems reviewed and negative except as noted above  Past Medical History:  Diagnosis Date  . 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 Arthritis   . CAD (coronary artery disease)    a. NSTEMI 07/2018 s/p difficult PCI with overlapping DES to LAD, residual OM2 disease treated medically.  . Chronic diastolic CHF (congestive heart failure) ([place])   . Chronic pain   . Chronic respiratory failure ([place]) On home 02 2-3L  . Compression fracture of lumbar vertebra ([place])   . COPD (chronic obstructive pulmonary disease) ([place])   . Essential hypertension   . Hyperglycemia, drug-induced   . Hyperlipidemia   . Kidney stones 2014   Left side, multiple  . Leukocytosis    . Myocardial infarct ([place])   . Osteoporosis 2013  . Thyroid disease    pt denies  . Tobacco abuse     Family History  Problem Relation Age of Onset  . Heart disease Mother   . Hypertension Sister   . Hypertension Brother     Social History   Socioeconomic History  . Marital status: Divorced    Spouse name: Not on file  . Number of children: 3  . Years of education: Not on file  . Highest education level: Not on file  Occupational History  . Occupation: disabled    Employer: DISABLED  Tobacco Use  . Smoking status: Current Some Day Smoker    Packs/day: 1.00    Years: 25.00    Pack years: 25.00    Types: Cigarettes  . Smokeless tobacco: Never Used  Vaping Use  . Vaping Use: Never used  Substance and Sexual Activity  . Alcohol use: No  . Drug use: No  . Sexual activity: Not Currently    Birth control/protection: Surgical  Other Topics Concern  . Not on file  Social History Narrative  . Not on file   Social Determinants of Health   Financial Resource Strain: Not on file  Food Insecurity: Not on file  Transportation Needs: Not on file  Physical Activity: Not on file  Stress: Not on file  Social Connections: Not on file  Intimate Partner Violence: Not on file    Past Surgical History:  Procedure Laterality Date  . CERVICAL BIOPSY    .  COLONOSCOPY N/A 03/31/2013   Procedure: COLONOSCOPY;  Surgeon: Rogene Houston, MD;  [facility];  Service: Endoscopy;  Laterality: N/A;  730  . CORONARY ATHERECTOMY N/A 08/19/2018   Procedure: CORONARY ATHERECTOMY;  Surgeon: Nelva Bush, MD;  Location: [place] [facility];  Service: Cardiovascular;  Laterality: N/A;  . CORONARY STENT INTERVENTION N/A 08/19/2018   Procedure: CORONARY STENT INTERVENTION;  Surgeon: Nelva Bush, MD;  Location: [place] [facility];  Service: Cardiovascular;  Laterality: N/A;  . INTRAVASCULAR ULTRASOUND/IVUS N/A 08/19/2018   Procedure: Intravascular Ultrasound/IVUS;  Surgeon: Nelva Bush, MD;  Location: [place] [facility];  Service: Cardiovascular;  Laterality: N/A;  . LEFT HEART CATH N/A 08/19/2018   Procedure: Left Heart Cath;  Surgeon: Nelva Bush, MD;  Location: [place] [facility];  Service: Cardiovascular;  Laterality: N/A;  . LEFT HEART CATH AND CORONARY ANGIOGRAPHY N/A 08/16/2018   Procedure: LEFT HEART CATH AND CORONARY ANGIOGRAPHY;  Surgeon: Nelva Bush, MD;  Location: [place] [facility];  Service: Cardiovascular;  Laterality: N/A;  . TUBAL LIGATION        Current Outpatient Medications:  .  albuterol (PROVENTIL) (2.5 MG/3ML) 0.083% nebulizer solution, Take 3 mLs (2.5 mg total) by nebulization every 4 (four) hours as needed for wheezing or shortness of breath. Dx: J44.9- COPD., Disp: 525 mL, Rfl: 0 .  albuterol (VENTOLIN HFA) 108 (90 Base) MCG/ACT inhaler, Inhale 2 puffs into the lungs every 4 (four) hours as needed for wheezing or shortness of breath., Disp: 1 each, Rfl: 1 .  alendronate (FOSAMAX) 70 MG tablet, TAKE 1 TAB BY MOUTH EVERY WEEK ON AN EMPTY STOMACH., Disp: 12 tablet, Rfl: 0 .  aspirin EC 81 MG tablet, Take 81 mg by mouth every morning., Disp: , Rfl:  .  atorvastatin (LIPITOR) 80 MG tablet, TAKE 1 TABLET BY MOUTH DAILY, Disp: 90 tablet, Rfl: 0 .  Blood Glucose Monitoring Suppl (BLOOD GLUCOSE SYSTEM PAK) KIT, Please dispense based on patient and insurance preference. Use as directed to monitor FSBS 1x daily. Dx: E11.9., Disp: 1 kit, Rfl: 1 .  Calcium Carb-Cholecalciferol 600-800 MG-UNIT TABS, Take 1 tablet by mouth 2 (two) times daily. , Disp: , Rfl:  .  clopidogrel (PLAVIX) 75 MG tablet, TAKE ONE TABLET BY MOUTH ONCE DAILY., Disp: 90 tablet, Rfl: 0 .  clotrimazole-betamethasone (LOTRISONE) cream, APPLY TO AFFECTED AREA TWICE DAILY., Disp: 45 g, Rfl: 0 .  diphenhydrAMINE (BENADRYL) 12.5 MG/5ML liquid, Take 6.25 mg by mouth 4 (four) times daily as needed., Disp: , Rfl:  .  ezetimibe (ZETIA) 10 MG tablet, Take 1 tablet (10 mg total) by mouth  daily., Disp: 90 tablet, Rfl: 0 .  fluticasone (FLONASE) 50 MCG/ACT nasal spray, INSTILL 1 SPRAY INTO EACH NOSTRIL DAILY AS NEEDED FOR ALLERGIES., Disp: 16 g, Rfl: 0 .  furosemide (LASIX) 40 MG tablet, ALTERNATE TAKING 2 TABLETS BY MOUTH ONE DAY, THEN 1 TABLET THE NEXT DAY. REPEAT., Disp: 135 tablet, Rfl: 0 .  Glucose Blood (BLOOD GLUCOSE TEST STRIPS) STRP, Please dispense based on patient and insurance preference. Use as directed to monitor FSBS 1x daily. Dx: E11.9., Disp: 100 strip, Rfl: 1 .  ipratropium (ATROVENT) 0.03 % nasal spray, Place 2 sprays into both nostrils every 12 (twelve) hours., Disp: 30 mL, Rfl: 12 .  Lancets MISC, Please dispense based on patient and insurance preference. Use as directed to monitor FSBS 1x daily. Dx: E11.9., Disp: 100 each, Rfl: 11 .  LINZESS 145 MCG CAPS capsule, TAKE 1 CAPSULE  BY MOUTH BEFORE BREAKFAST., Disp: 30 capsule, Rfl: 0 .  Magnesium 250 MG TABS, Take 1 tablet daily, Disp: , Rfl: 0 .  metoprolol tartrate (LOPRESSOR) 25 MG tablet, Take 1 tablet (25 mg total) by mouth 2 (two) times daily., Disp: 180 tablet, Rfl: 3 .  mupirocin ointment (BACTROBAN) 2 %, Apply to affected areas on buttocks and toe twice a day for 1 week, Disp: 22 g, Rfl: 0 .  nitroGLYCERIN (NITROSTAT) 0.4 MG SL tablet, Place 1 tablet (0.4 mg total) under the tongue every 5 (five) minutes as needed for chest pain. If no improvement with 3 doses call 911, Disp: 50 tablet, Rfl: 3 .  oxyCODONE-acetaminophen (PERCOCET) 5-325 MG tablet, Take 1 tablet by mouth every 6 (six) hours as needed for severe pain., Disp: 30 tablet, Rfl: 0 .  OXYGEN, Inhale 3 L into the lungs continuous. , Disp: , Rfl:  .  pantoprazole (PROTONIX) 40 MG tablet, TAKE ONE TABLET BY MOUTH TWICE A DAY, Disp: 180 tablet, Rfl: 0 .  polyethylene glycol (MIRALAX / GLYCOLAX) packet, Take 17 g by mouth daily. (Patient taking differently: Take 17 g by mouth daily as needed for mild constipation or moderate constipation.), Disp: 14 each,  Rfl: 0 .  potassium chloride SA (KLOR-CON) 20 MEQ tablet, Take 1 tablet (20 mEq total) by mouth daily for 3 days., Disp: 3 tablet, Rfl: 0 .  spironolactone (ALDACTONE) 25 MG tablet, Take 1 tablet (25 mg total) by mouth daily., Disp: 90 tablet, Rfl: 3 .  SYMBICORT 160-4.5 MCG/ACT inhaler, INHALE 2 PUFFS INTO THE LUNGS TWICE DAILY., Disp: 30.6 g, Rfl: 0 .  SPIRIVA RESPIMAT 2.5 MCG/ACT AERS, INHALE 2 PUFFS INTO THE LUNGS ONCE DAILY., Disp: 12 g, Rfl: 0    Physical Exam: Height 5' 2.5" (1.588 m), weight 85.7 kg.   Telephone no exam   Labs:   Lab Results  Component Value Date   WBC 22.4 (H) 12/17/2020   HGB 14.0 12/17/2020   HCT 41.8 12/17/2020   MCV 93.3 12/17/2020   PLT 308 12/17/2020   No results for input(s): NA, K, CL, CO2, BUN, CREATININE, CALCIUM, PROT, BILITOT, ALKPHOS, ALT, AST, GLUCOSE in the last 168 hours.  Invalid input(s): LABALBU Lab Results  Component Value Date   TROPONINI <0.03 02/24/2019    Lab Results  Component Value Date   CHOL 153 08/24/2020   CHOL 141 06/15/2020   CHOL 139 03/06/2020   Lab Results  Component Value Date   HDL 46 (L) 08/24/2020   HDL 40 (L) 06/15/2020   HDL 40 (L) 03/06/2020   Lab Results  Component Value Date   LDLCALC 76 08/24/2020   LDLCALC 70 06/15/2020   LDLCALC 72 03/06/2020   Lab Results  Component Value Date   TRIG 215 (H) 08/24/2020   TRIG 225 (H) 06/15/2020   TRIG 196 (H) 03/06/2020   Lab Results  Component Value Date   CHOLHDL 3.3 08/24/2020   CHOLHDL 3.5 06/15/2020   CHOLHDL 3.5 03/06/2020   No results found for: LDLDIRECT    Radiology: CT ABDOMEN PELVIS WO CONTRAST  Result Date: 01/31/2021 CLINICAL DATA:  Flank pain, kidney stones suspected, hematuria EXAM: CT ABDOMEN AND PELVIS WITHOUT CONTRAST TECHNIQUE: Multidetector CT imaging of the abdomen and pelvis was performed following the standard protocol without IV contrast. COMPARISON:  02/25/2019 FINDINGS: Lower chest: No acute abnormality.  Coronary artery  calcifications. Hepatobiliary: No solid liver abnormality is seen. No gallstones, gallbladder wall thickening, or biliary dilatation. Pancreas: Unremarkable. No pancreatic  ductal dilatation or surrounding inflammatory changes. Spleen: Normal in size without significant abnormality. Adrenals/Urinary Tract: Stable, definitively benign, fat containing bilateral adrenal adenomata. Multiple bilateral nonobstructive renal calculi. Bladder is unremarkable. Stomach/Bowel: Stomach is within normal limits. Diverticula of the descending portion of the duodenum. Appendix appears normal. No evidence of bowel wall thickening, distention, or inflammatory changes. Sigmoid diverticulosis. Vascular/Lymphatic: Aortic atherosclerosis. No enlarged abdominal or pelvic lymph nodes. Reproductive: No mass or other significant abnormality. Other: No abdominal wall hernia or abnormality. No abdominopelvic ascites. Musculoskeletal: No acute or significant osseous findings. IMPRESSION: 1. Multiple bilateral nonobstructive renal calculi. No hydronephrosis. 2. Sigmoid diverticulosis without evidence of acute diverticulitis. 3. Coronary artery disease. Aortic Atherosclerosis (ICD10-I70.0). Electronically Signed   By: Eddie Candle M.D.   On: 01/31/2021 10:40    EKG: No ECG    ASSESSMENT AND PLAN:   1. CAD: SEMI August 2019 with preserved EF. DAT continues due to complexity of lesion Residual OM2 disease Activity limited by COPD no angina continue medical Rx  2. COPD:  Encouraged smoking cessation Should have lung cancer screening CT per primary  3. HTN:  Well controlled.  Continue current medications and low sodium Dash type diet.   4. HLD on statin and zetia LDL 76 08/24/20  5. Kidney Stones:  Non obstructing f/u with Dr Valetta Fuller   Time Spent reviewing chart previous angiogram direct patient interview and composing note 20 minutes   F/U in 6 months in person   Signed: Jenkins Rouge 02/15/2021, 10:36 AM

## 2021-02-18 ENCOUNTER — Encounter: Payer: Self-pay | Admitting: Family Medicine

## 2021-02-18 ENCOUNTER — Ambulatory Visit (INDEPENDENT_AMBULATORY_CARE_PROVIDER_SITE_OTHER): Payer: Medicare Other | Admitting: Family Medicine

## 2021-02-18 ENCOUNTER — Other Ambulatory Visit: Payer: Self-pay

## 2021-02-18 VITALS — BP 138/74 | HR 114 | Temp 97.9°F | Resp 18 | Ht 62.0 in | Wt 181.0 lb

## 2021-02-18 DIAGNOSIS — I10 Essential (primary) hypertension: Secondary | ICD-10-CM | POA: Diagnosis not present

## 2021-02-18 DIAGNOSIS — E119 Type 2 diabetes mellitus without complications: Secondary | ICD-10-CM

## 2021-02-18 DIAGNOSIS — J9611 Chronic respiratory failure with hypoxia: Secondary | ICD-10-CM

## 2021-02-18 DIAGNOSIS — G8929 Other chronic pain: Secondary | ICD-10-CM

## 2021-02-18 DIAGNOSIS — M7989 Other specified soft tissue disorders: Secondary | ICD-10-CM

## 2021-02-18 DIAGNOSIS — E785 Hyperlipidemia, unspecified: Secondary | ICD-10-CM

## 2021-02-18 DIAGNOSIS — M8080XD Other osteoporosis with current pathological fracture, unspecified site, subsequent encounter for fracture with routine healing: Secondary | ICD-10-CM

## 2021-02-18 DIAGNOSIS — I5032 Chronic diastolic (congestive) heart failure: Secondary | ICD-10-CM | POA: Diagnosis not present

## 2021-02-18 DIAGNOSIS — Z720 Tobacco use: Secondary | ICD-10-CM

## 2021-02-18 DIAGNOSIS — J441 Chronic obstructive pulmonary disease with (acute) exacerbation: Secondary | ICD-10-CM

## 2021-02-18 DIAGNOSIS — N2581 Secondary hyperparathyroidism of renal origin: Secondary | ICD-10-CM

## 2021-02-18 DIAGNOSIS — I2510 Atherosclerotic heart disease of native coronary artery without angina pectoris: Secondary | ICD-10-CM | POA: Diagnosis not present

## 2021-02-18 DIAGNOSIS — R21 Rash and other nonspecific skin eruption: Secondary | ICD-10-CM

## 2021-02-18 MED ORDER — MUPIROCIN 2 % EX OINT: TOPICAL_OINTMENT | CUTANEOUS | 0 refills | Status: DC

## 2021-02-18 MED ORDER — OXYCODONE-ACETAMINOPHEN 5-325 MG PO TABS
1.0000 | ORAL_TABLET | Freq: Four times a day (QID) | ORAL | 0 refills | Status: DC | PRN
Start: 2021-02-18 — End: 2021-05-16

## 2021-02-18 MED ORDER — PREDNISONE 20 MG PO TABS
ORAL_TABLET | ORAL | 0 refills | Status: DC
Start: 2021-02-18 — End: 2021-03-06

## 2021-02-18 MED ORDER — DOXYCYCLINE HYCLATE 100 MG PO TABS: 100.0000 mg | ORAL_TABLET | Freq: Two times a day (BID) | ORAL | 0 refills | Status: DC

## 2021-02-18 NOTE — Progress Notes (Signed)
Subjective:    Patient ID: Cindy [name], female    DOB: Feb 10, 1956, 65 y.o.   MRN: [id number]  Patient presents for COPD Exacerbation (SOB, cough/), Rash (Irritation to buttocks, under abdominal folds), and Nasal Congestion (States that she can't breathe through her nose)  65 year old female with very complicated history with multiple comorbidities in the setting of her end-stage COPD chronic respiratory failure.  Her caregiver is a friend of hers.  She has history of chronic back pain has history of compression fractures.  Osteoporosis.  She is also very sedentary in the setting of her severe COPD.  He also has underlying history of kidney stones.  In the past week states that she has had some pain on the left side of her back that is more like a spasm or shooting pain sometimes it radiates around.  She used a pain patch and that helped.  She also takes a half a tablet of oxycodone twice a day.  She denies any rash of the skin.  Hematuria she has an appointment with urology for cystoscopy which she is not very excited about however she is a smoker with ongoing blood in the urine.  She has history of recurrent rash on his buttocks.  She has been using Bactroban which is helped but she still has some itching in the spot.  She states she not had any recent drainage  but did have one that had some pus that was coming out initially She also used fanny cream    Has appt in March with Pulmonary over the past few days she has had difficulty with her COPD.  Her nose is congested we have tried multiple nasal sprays including nasal saline nasal steroid ipratropium and none of them have really helped.  She does have humidified oxygen at home.  She states that she has had increased wheezing and cough with some mild production.  She requests a breathing treatment here in the office as well as steroids.  She is maintained on 10 mg once a day from her pulmonologist.   DM- last A1C 8.1%, she states she stopped  taking her glipizide when she was feeling sick last month.  She is also not been checking her blood sugars.  Hypercalcemia advised her to stop her calcium her calcium level was elevated she also had elevated PTH.  She did stop taking the calcium and her level improved.  However she is not taking her vitamin D.  She is on both Suprax.     Review Of Systems:  GEN- denies fatigue, fever, weight loss,weakness, recent illness HEENT- denies eye drainage, change in vision, nasal discharge, CVS- denies chest pain, palpitations RESP- + SOB, +  cough, wheeze ABD- denies N/V, change in stools, abd pain GU- denies dysuria, hematuria, dribbling, incontinence MSK-+ joint pain, muscle aches, injury Neuro- denies headache, dizziness, syncope, seizure activity       Objective:    BP 138/74   Pulse (!) 114   Temp 97.9 F (36.6 C) (Temporal)   Resp 18   Ht 5\' 2"  (1.575 m)   Wt 181 lb (82.1 kg)   SpO2 95% Comment: 3 L/min via [place]  BMI 33.11 kg/m  GEN- NAD, alert and oriented x3, sitting in chair, has rollator  HEENT- PERRL, EOMI, non injected sclera, pink conjunctiva, MMM, oropharynx clear, nares mild enlarged turbinates Neck- Supple, no thyromegaly CVS- RR tachycardia, no murmur RESP- bilat wheeze, fair Air movement, s/p neb, improved air movement, rhonchi , on  3L ABD-NABS,soft,NT,ND Skin left buttocks 2 scaley erythematous patches, no pustular lesions, NT  no streaking Ext- trace edema right ankle/foot    Assessment & Plan:      Problem List Items Addressed This Visit      Unprioritized   CAD (coronary artery disease)   Chronic diastolic CHF (congestive heart failure) (HCC)    Continue lasix       Chronic hypoxemic respiratory failure (HCC)    Acute exacerbation although oftentimes very difficult to tell as she has chronic issues with her breathing.  Recommend checking follow-up with her pulmonologist.  I have given her a Depo-Medrol 80 mg today along with a DuoNeb.  She did have a  some relief in her breathing.  I sent her for prednisone taper and advised her to pick up the doxycycline to have on hand  For now use nasal saline and cool mist humidifer in home       Chronic pain    Chronic back pain, no red flags today, more mid thoracic,s kin intact Refilled meds       Relevant Medications   oxyCODONE-acetaminophen (PERCOCET) 5-325 MG tablet   predniSONE (DELTASONE) 10 MG tablet   predniSONE (DELTASONE) 20 MG tablet   Diabetes mellitus without complication ([place])    Unclear if controlled or not.  She has not been checking her blood sugars or taking her oral antidiabetic.  She is on chronic steroids so expect that her sugar is going to be elevated.      Relevant Orders   Hemoglobin A1c   Essential hypertension    Bp overall looks okay This elevates with her movement as well as chronic elevated HR in setting of her COPD       Relevant Orders   Comprehensive metabolic panel   CBC with Differential/Platelet   Hyperlipidemia    SHe is on statin.      Leg swelling   Osteoporosis   Secondary hyperparathyroidism ([place])    Dementia take vitamin D 2000 international units once a day.  Remain off the calcium for now.  She is also on Fosamax.      Tobacco abuse (Chronic)    Other Visit Diagnoses    COPD exacerbation ([place])    -  Primary   Relevant Medications   predniSONE (DELTASONE) 10 MG tablet   predniSONE (DELTASONE) 20 MG tablet   Skin rash       recurrent dermatitis, but she sits all day, it does respond to topical treatments, can spot treat with hydrocortisone 1% for itching as well       Note: This dictation was prepared with Dragon dictation along with smaller phrase technology. Any transcriptional errors that result from this process are unintentional.

## 2021-02-18 NOTE — Assessment & Plan Note (Signed)
Dementia take vitamin D 2000 international units once a day.  Remain off the calcium for now.  She is also on Fosamax.

## 2021-02-18 NOTE — Assessment & Plan Note (Signed)
Chronic back pain, no red flags today, more mid thoracic,s kin intact Refilled meds

## 2021-02-18 NOTE — Assessment & Plan Note (Signed)
Bp overall looks okay This elevates with her movement as well as chronic elevated HR in setting of her COPD

## 2021-02-18 NOTE — Assessment & Plan Note (Signed)
Continue lasix  °

## 2021-02-18 NOTE — Patient Instructions (Addendum)
Take Vitamin D 2000IU once a day - this over the counter Take the steroids as prescribed Use the bactroban cream and use topical steroid cortisone for itching  Pain medicine refilled Use the nasal saline three times a day  Take antibiotics No calcium supplement Call for a new PCP appointment

## 2021-02-18 NOTE — Assessment & Plan Note (Signed)
Unclear if controlled or not.  She has not been checking her blood sugars or taking her oral antidiabetic.  She is on chronic steroids so expect that her sugar is going to be elevated.

## 2021-02-18 NOTE — Assessment & Plan Note (Addendum)
Acute exacerbation although oftentimes very difficult to tell as she has chronic issues with her breathing.  Recommend checking follow-up with her pulmonologist.  I have given her a Depo-Medrol 80 mg today along with a DuoNeb.  She did have a some relief in her breathing.  I sent her for prednisone taper and advised her to pick up the doxycycline to have on hand  For now use nasal saline and cool mist humidifer in home

## 2021-02-18 NOTE — Assessment & Plan Note (Addendum)
SHe is on statin.

## 2021-02-19 LAB — COMPREHENSIVE METABOLIC PANEL
AG Ratio: 1.8 (calc) (ref 1.0–2.5)
ALT: 33 U/L — ABNORMAL HIGH (ref 6–29)
AST: 27 U/L (ref 10–35)
Albumin: 4.2 g/dL (ref 3.6–5.1)
Alkaline phosphatase (APISO): 79 U/L (ref 37–153)
BUN/Creatinine Ratio: 14 (calc) (ref 6–22)
BUN: 14 mg/dL (ref 7–25)
CO2: 43 mmol/L — ABNORMAL HIGH (ref 20–32)
Calcium: 9.1 mg/dL (ref 8.6–10.4)
Chloride: 85 mmol/L — ABNORMAL LOW (ref 98–110)
Creat: 1.03 mg/dL — ABNORMAL HIGH (ref 0.50–0.99)
Globulin: 2.4 g/dL (calc) (ref 1.9–3.7)
Glucose, Bld: 221 mg/dL — ABNORMAL HIGH (ref 65–99)
Potassium: 3.8 mmol/L (ref 3.5–5.3)
Sodium: 138 mmol/L (ref 135–146)
Total Bilirubin: 0.4 mg/dL (ref 0.2–1.2)
Total Protein: 6.6 g/dL (ref 6.1–8.1)

## 2021-02-19 LAB — CBC WITH DIFFERENTIAL/PLATELET
Absolute Monocytes: 887 cells/uL (ref 200–950)
Basophils Absolute: 52 cells/uL (ref 0–200)
Basophils Relative: 0.3 %
Eosinophils Absolute: 17 cells/uL (ref 15–500)
Eosinophils Relative: 0.1 %
HCT: 39.9 % (ref 35.0–45.0)
Hemoglobin: 13.4 g/dL (ref 11.7–15.5)
Lymphs Abs: 2053 cells/uL (ref 850–3900)
MCH: 31.1 pg (ref 27.0–33.0)
MCHC: 33.6 g/dL (ref 32.0–36.0)
MCV: 92.6 fL (ref 80.0–100.0)
MPV: 12.3 fL (ref 7.5–12.5)
Monocytes Relative: 5.1 %
Neutro Abs: 14390 cells/uL — ABNORMAL HIGH (ref 1500–7800)
Neutrophils Relative %: 82.7 %
Platelets: 313 10*3/uL (ref 140–400)
RBC: 4.31 10*6/uL (ref 3.80–5.10)
RDW: 12.6 % (ref 11.0–15.0)
Total Lymphocyte: 11.8 %
WBC: 17.4 10*3/uL — ABNORMAL HIGH (ref 3.8–10.8)

## 2021-02-19 LAB — HEMOGLOBIN A1C
Hgb A1c MFr Bld: 8.3 % of total Hgb — ABNORMAL HIGH (ref <5.7)
Mean Plasma Glucose: 192 mg/dL
eAG (mmol/L): 10.6 mmol/L

## 2021-02-19 MED ORDER — METHYLPREDNISOLONE ACETATE 80 MG/ML IJ SUSP
80.0000 mg | Freq: Once | INTRAMUSCULAR | Status: AC
  Administered 2021-02-18: 80 mg via INTRAMUSCULAR

## 2021-02-19 MED ORDER — IPRATROPIUM-ALBUTEROL 0.5-2.5 (3) MG/3ML IN SOLN
3.0000 mL | Freq: Once | RESPIRATORY_TRACT | Status: AC
  Administered 2021-02-18: 3 mL via RESPIRATORY_TRACT

## 2021-02-19 NOTE — Addendum Note (Signed)
Addended by: Sheral Flow on: 02/19/2021 08:23 AM   Modules accepted: Orders

## 2021-02-20 NOTE — Telephone Encounter (Signed)
ERROR

## 2021-02-22 ENCOUNTER — Other Ambulatory Visit (HOSPITAL_COMMUNITY): Payer: Medicare Other

## 2021-02-22 ENCOUNTER — Telehealth: Payer: Self-pay | Admitting: Family Medicine

## 2021-02-22 DIAGNOSIS — J441 Chronic obstructive pulmonary disease with (acute) exacerbation: Secondary | ICD-10-CM

## 2021-02-22 DIAGNOSIS — J9611 Chronic respiratory failure with hypoxia: Secondary | ICD-10-CM

## 2021-02-22 MED ORDER — SPIRIVA RESPIMAT 2.5 MCG/ACT IN AERS: 2.0000 | INHALATION_SPRAY | Freq: Every day | RESPIRATORY_TRACT | 11 refills | Status: AC

## 2021-02-22 MED ORDER — ALENDRONATE SODIUM 70 MG PO TABS: ORAL_TABLET | ORAL | 0 refills | Status: DC

## 2021-02-22 NOTE — Telephone Encounter (Signed)
Prescription sent to pharmacy.

## 2021-02-22 NOTE — Telephone Encounter (Signed)
Refill Stann O[place] Fosamax    [facility] [phone] - [address], [address] AT [address]

## 2021-02-27 ENCOUNTER — Other Ambulatory Visit: Payer: Self-pay | Admitting: *Deleted

## 2021-02-27 MED ORDER — GLIPIZIDE 10 MG PO TABS: 10.0000 mg | ORAL_TABLET | Freq: Two times a day (BID) | ORAL | 3 refills | Status: AC

## 2021-03-05 ENCOUNTER — Ambulatory Visit (HOSPITAL_COMMUNITY): Payer: Medicare Other

## 2021-03-05 ENCOUNTER — Telehealth: Payer: Self-pay

## 2021-03-05 DIAGNOSIS — K219 Gastro-esophageal reflux disease without esophagitis: Secondary | ICD-10-CM

## 2021-03-05 NOTE — Telephone Encounter (Signed)
Sonda Primes walkin need med refill for Cindy [name] predniSONE (DELTASONE) 10 MG tablet if will call in to pharmacy. Contact # [phone] if any questions  Also needs med refill Fluconazole 150 mg  Pharmacy: [facility]

## 2021-03-05 NOTE — Telephone Encounter (Signed)
Sonda Primes walkin for med refills:  pantoprazole (PROTONIX) 40 MG tablet  furosemide (LASIX) 40 MG tablet  Pharmacy: [facility]

## 2021-03-05 NOTE — Telephone Encounter (Signed)
Patient also needs prescription ear drops for her ears a lot of wax build up in both ears. Contact # [phone].  Pharmacy: [facility] [address] [employer]

## 2021-03-05 NOTE — Telephone Encounter (Signed)
Cindy [name] walkin need med refill  Guaifenesin-Codiene Syrup 180 mg  Pharmacy:  [facility]

## 2021-03-05 NOTE — Telephone Encounter (Signed)
Please update pharmacy to [facility] [address] to send in cough syrup.

## 2021-03-06 ENCOUNTER — Other Ambulatory Visit: Payer: Self-pay | Admitting: Family Medicine

## 2021-03-06 ENCOUNTER — Telehealth: Payer: Self-pay

## 2021-03-06 ENCOUNTER — Telehealth: Payer: Self-pay | Admitting: Family Medicine

## 2021-03-06 MED ORDER — FLUCONAZOLE 150 MG PO TABS: 150.0000 mg | ORAL_TABLET | Freq: Once | ORAL | 0 refills | Status: AC

## 2021-03-06 MED ORDER — PREDNISONE 20 MG PO TABS: ORAL_TABLET | ORAL | 0 refills | Status: DC

## 2021-03-06 MED ORDER — FUROSEMIDE 40 MG PO TABS: ORAL_TABLET | ORAL | 0 refills | Status: DC

## 2021-03-06 MED ORDER — PANTOPRAZOLE SODIUM 40 MG PO TBEC: 40.0000 mg | DELAYED_RELEASE_TABLET | Freq: Two times a day (BID) | ORAL | 0 refills | Status: DC

## 2021-03-06 MED ORDER — NYSTATIN 100000 UNIT/ML MT SUSP: 5.0000 mL | Freq: Four times a day (QID) | OROMUCOSAL | 0 refills | Status: DC

## 2021-03-06 MED ORDER — GUAIFENESIN-CODEINE 100-10 MG/5ML PO SOLN: 5.0000 mL | Freq: Four times a day (QID) | ORAL | 0 refills | Status: DC | PRN

## 2021-03-06 NOTE — Telephone Encounter (Signed)
Pt is asking for guaifenesin with cod 180mg  and prednisone taper refill

## 2021-03-06 NOTE — Addendum Note (Signed)
Addended by: Vic Blackbird F on: 03/06/2021 03:18 PM   Modules accepted: Orders

## 2021-03-06 NOTE — Telephone Encounter (Signed)
Patient called and said should be 20 mg instead of 10 mg on the predniSONE (DELTASONE) 20 MG tablet

## 2021-03-06 NOTE — Telephone Encounter (Signed)
Pt called asking about her refills that she needs to see if they have been sent to pharmacy.  Cb#: [phone]

## 2021-03-06 NOTE — Telephone Encounter (Signed)
Duplicate messages

## 2021-03-06 NOTE — Telephone Encounter (Signed)
Prescription sent to pharmacy.

## 2021-03-06 NOTE — Telephone Encounter (Signed)
Patient called need med refill Doxycycline (VIBRA-TABS) 100 MG tablet   [place]

## 2021-03-06 NOTE — Addendum Note (Signed)
Addended by: Sheral Flow on: 03/06/2021 05:04 PM   Modules accepted: Orders

## 2021-03-06 NOTE — Telephone Encounter (Signed)
patient called back name of cough syrup name: Guaifenex with codiene 180 mg

## 2021-03-06 NOTE — Telephone Encounter (Signed)
I refilled cough med She can keep prednisone taper on hand If she is getting sick she needs to reach out to her lung doctor or go to ER to be seen if she doesn't have new PCP yet

## 2021-03-06 NOTE — Telephone Encounter (Signed)
Medication sent as is

## 2021-03-06 NOTE — Telephone Encounter (Signed)
error 

## 2021-03-06 NOTE — Telephone Encounter (Signed)
Patient will need to be seen before ABTx can be prescribed   PCP states that she will need to contact pulmonology or UC if sick.

## 2021-03-07 ENCOUNTER — Telehealth: Payer: Self-pay

## 2021-03-07 NOTE — Telephone Encounter (Signed)
Patient called patient said the wrong mls was sent into the pharmacy.   nystatin (MYCOSTATIN) Patient said it was written wrong and it's not what she used to have.

## 2021-03-07 NOTE — Telephone Encounter (Signed)
error 

## 2021-03-07 NOTE — Telephone Encounter (Signed)
Call placed to pharmacy.  New prescription given for magic mouthwash.

## 2021-03-11 ENCOUNTER — Other Ambulatory Visit: Payer: Self-pay

## 2021-03-11 ENCOUNTER — Ambulatory Visit (INDEPENDENT_AMBULATORY_CARE_PROVIDER_SITE_OTHER): Payer: Medicare Other | Admitting: Internal Medicine

## 2021-03-11 ENCOUNTER — Encounter: Payer: Self-pay | Admitting: Internal Medicine

## 2021-03-11 ENCOUNTER — Telehealth: Payer: Self-pay | Admitting: *Deleted

## 2021-03-11 VITALS — BP 156/85 | HR 109 | Temp 98.2°F | Resp 22 | Ht 62.0 in | Wt 182.4 lb

## 2021-03-11 DIAGNOSIS — Z7689 Persons encountering health services in other specified circumstances: Secondary | ICD-10-CM

## 2021-03-11 DIAGNOSIS — I252 Old myocardial infarction: Secondary | ICD-10-CM | POA: Diagnosis not present

## 2021-03-11 DIAGNOSIS — G8929 Other chronic pain: Secondary | ICD-10-CM

## 2021-03-11 DIAGNOSIS — H6121 Impacted cerumen, right ear: Secondary | ICD-10-CM

## 2021-03-11 DIAGNOSIS — E119 Type 2 diabetes mellitus without complications: Secondary | ICD-10-CM | POA: Diagnosis not present

## 2021-03-11 DIAGNOSIS — B370 Candidal stomatitis: Secondary | ICD-10-CM

## 2021-03-11 DIAGNOSIS — K219 Gastro-esophageal reflux disease without esophagitis: Secondary | ICD-10-CM

## 2021-03-11 DIAGNOSIS — J9611 Chronic respiratory failure with hypoxia: Secondary | ICD-10-CM | POA: Diagnosis not present

## 2021-03-11 DIAGNOSIS — I10 Essential (primary) hypertension: Secondary | ICD-10-CM

## 2021-03-11 DIAGNOSIS — I5032 Chronic diastolic (congestive) heart failure: Secondary | ICD-10-CM | POA: Diagnosis not present

## 2021-03-11 DIAGNOSIS — E785 Hyperlipidemia, unspecified: Secondary | ICD-10-CM

## 2021-03-11 DIAGNOSIS — J441 Chronic obstructive pulmonary disease with (acute) exacerbation: Secondary | ICD-10-CM

## 2021-03-11 MED ORDER — AZITHROMYCIN 250 MG PO TABS: ORAL_TABLET | ORAL | 0 refills | Status: DC

## 2021-03-11 MED ORDER — CARBAMIDE PEROXIDE 6.5 % OT SOLN
5.0000 [drp] | Freq: Two times a day (BID) | OTIC | 0 refills | Status: DC
Start: 2021-03-11 — End: 2021-09-03

## 2021-03-11 MED ORDER — METHYLPREDNISOLONE SODIUM SUCC 125 MG IJ SOLR
125.0000 mg | Freq: Once | INTRAMUSCULAR | Status: AC
  Administered 2021-03-11: 125 mg via INTRAMUSCULAR

## 2021-03-11 MED ORDER — AMLODIPINE BESYLATE 5 MG PO TABS: 5.0000 mg | ORAL_TABLET | Freq: Every day | ORAL | 0 refills | Status: DC

## 2021-03-11 MED ORDER — NYSTATIN 100000 UNIT/ML MT SUSP: 5.0000 mL | Freq: Four times a day (QID) | OROMUCOSAL | 0 refills | Status: DC

## 2021-03-11 NOTE — Telephone Encounter (Signed)
Pt called and said she is already taking a high blood pressure pill its called metoprolol and she wanted you to be aware because you prescribed amlodipine and she doesn't need this

## 2021-03-11 NOTE — Assessment & Plan Note (Signed)
Lab Results  Component Value Date   HGBA1C 8.3 (H) 02/18/2021   Likely due to chronic steroid use On Glipizide Advised to follow diabetic diet On statin F/u CMP and lipid panel Diabetic eye exam: Advised to follow up with Ophthalmology for diabetic eye exam

## 2021-03-11 NOTE — Progress Notes (Signed)
New Patient Office Visit  Subjective:  Patient ID: Cindy [name], female    DOB: Sep 22, 1956  Age: 65 y.o. MRN: [id number]  CC:  Chief Complaint  Patient presents with  . New Patient (Initial Visit)    New patient former dr Buelah Manis pt establishing care     HPI Cindy [name] is a 65 year old female with past medical history of CAD s/p stent placement, HFpEF, chronic hypoxic respiratory failure due to underlying COPD -on 3 L home O2, type II DM, ureteral stone, HLD, chronic back pain, physical deconditioning -uses wheelchair and obesity who presents for establishing care.  She is a former patient of Dr. Buelah Manis.  She complains of chronic dyspnea and wheezing, but has been having worse dyspnea for last few days.  She uses Symbicort and Spiriva and as needed albuterol for COPD.  She follows up with Dr Lamonte Sakai for COPD.  She denies any fever, chills, sore throat or chest pain.  She complains of ear fullness and chronic excess earwax.  Denies any ear discharge currently.  Her blood pressure was elevated today.  She states that her blood pressure remains high and thinks that she is already on blood pressure medication.  Of note, she is on Lasix and spironolactone primarily for HFpEF.  She currently denies any dizziness, chest pain or palpitations.  She complains of having oral thrush, for which she used to use nystatin swish and swallow.  Denies any dysphagia or odynophagia.  She has history of ureteral stone, for which she follows up with Urology.  She has had  2 doses of COVID and also has had flu vaccine. Pneumovax taken in 2012.  Past Medical History:  Diagnosis Date  . 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 Arthritis   . CAD (coronary artery disease)    a. NSTEMI 07/2018 s/p difficult PCI with overlapping DES to LAD, residual OM2 disease treated medically.  . Chronic diastolic CHF (congestive heart failure) ([place])   . Chronic pain   . Chronic respiratory failure ([place]) On home 02 2-3L  .  Compression fracture of lumbar vertebra ([place])   . COPD (chronic obstructive pulmonary disease) ([place])   . Essential hypertension   . Hyperglycemia, drug-induced   . Hyperlipidemia   . Kidney stones 2014   Left side, multiple  . Leukocytosis   . Myocardial infarct ([place])   . Osteoporosis 2013  . Thyroid disease    pt denies  . Tobacco abuse     Past Surgical History:  Procedure Laterality Date  . CERVICAL BIOPSY    . COLONOSCOPY N/A 03/31/2013   Procedure: COLONOSCOPY;  Surgeon: Rogene Houston, MD;  Location: [facility];  Service: Endoscopy;  Laterality: N/A;  730  . CORONARY ATHERECTOMY N/A 08/19/2018   Procedure: CORONARY ATHERECTOMY;  Surgeon: Nelva Bush, MD;  Location: [facility];  Service: Cardiovascular;  Laterality: N/A;  . CORONARY STENT INTERVENTION N/A 08/19/2018   Procedure: CORONARY STENT INTERVENTION;  Surgeon: Nelva Bush, MD;  Location: [facility];  Service: Cardiovascular;  Laterality: N/A;  . INTRAVASCULAR ULTRASOUND/IVUS N/A 08/19/2018   Procedure: Intravascular Ultrasound/IVUS;  Surgeon: Nelva Bush, MD;  Location: [facility];  Service: Cardiovascular;  Laterality: N/A;  . LEFT HEART CATH N/A 08/19/2018   Procedure: Left Heart Cath;  Surgeon: Nelva Bush, MD;  Location: [facility];  Service: Cardiovascular;  Laterality: N/A;  . LEFT HEART CATH AND CORONARY ANGIOGRAPHY N/A 08/16/2018   Procedure: LEFT HEART  CATH AND CORONARY ANGIOGRAPHY;  Surgeon: Nelva Bush, MD;  Location: [facility];  Service: Cardiovascular;  Laterality: N/A;  . TUBAL LIGATION      Family History  Problem Relation Age of Onset  . Heart disease Mother   . Hypertension Sister   . Hypertension Brother     Social History   Socioeconomic History  . Marital status: Divorced    Spouse name: Not on file  . Number of children: 3  . Years of education: Not on file  . Highest education level: Not on file  Occupational History  .  Occupation: disabled    Employer: DISABLED  Tobacco Use  . Smoking status: Current Some Day Smoker    Packs/day: 1.00    Years: 25.00    Pack years: 25.00    Types: Cigarettes  . Smokeless tobacco: Never Used  Vaping Use  . Vaping Use: Never used  Substance and Sexual Activity  . Alcohol use: No  . Drug use: No  . Sexual activity: Not Currently    Birth control/protection: Surgical  Other Topics Concern  . Not on file  Social History Narrative  . Not on file   Social Determinants of Health   Financial Resource Strain: Not on file  Food Insecurity: Not on file  Transportation Needs: Not on file  Physical Activity: Not on file  Stress: Not on file  Social Connections: Not on file  Intimate Partner Violence: Not on file    ROS Review of Systems  Constitutional: Positive for fatigue. Negative for chills and fever.  HENT: Positive for congestion, ear discharge and postnasal drip. Negative for ear pain and rhinorrhea.   Eyes: Negative for pain and discharge.  Respiratory: Positive for cough, shortness of breath and wheezing.   Cardiovascular: Negative for chest pain and palpitations.  Gastrointestinal: Positive for constipation. Negative for diarrhea, nausea and vomiting.  Endocrine: Negative for polydipsia and polyuria.  Genitourinary: Negative for dysuria and hematuria.  Musculoskeletal: Positive for back pain. Negative for neck pain and neck stiffness.  Skin: Negative for rash.  Neurological: Negative for dizziness and syncope.  Psychiatric/Behavioral: Negative for agitation. The patient is nervous/anxious.     Objective:   Today's Vitals: BP (!) 156/85 (BP Location: Right Arm, Patient Position: Sitting, Cuff Size: Normal)   Pulse (!) 109   Temp 98.2 F (36.8 C) (Oral)   Resp (!) 22   Ht 5' 2"  (1.575 m)   Wt 182 lb 6.4 oz (82.7 kg)   SpO2 96%   BMI 33.36 kg/m   Physical Exam Vitals reviewed.  Constitutional:      General: She is not in acute distress.     Appearance: She is obese. She is not diaphoretic.     Comments: In wheelchair  HENT:     Head: Normocephalic and atraumatic.     Nose: Nose normal.     Mouth/Throat:     Comments: Oral whitish patches Eyes:     General: No scleral icterus.    Extraocular Movements: Extraocular movements intact.  Cardiovascular:     Rate and Rhythm: Normal rate and regular rhythm.     Pulses: Normal pulses.     Heart sounds: Normal heart sounds. No murmur heard.   Pulmonary:     Breath sounds: Wheezing (B/l diffuse) present. No rales.     Comments: On 3 l O2 Abdominal:     Palpations: Abdomen is soft.     Tenderness: There is no abdominal tenderness.  Musculoskeletal:     Cervical back: Neck supple. No tenderness.     Right lower leg: No edema.     Left lower leg: No edema.  Skin:    General: Skin is warm.     Findings: No rash.  Neurological:     General: No focal deficit present.     Mental Status: She is alert and oriented to person, place, and time.  Psychiatric:        Mood and Affect: Mood normal.        Behavior: Behavior normal.     Assessment & Plan:   Problem List Items Addressed This Visit      Encounter to establish care    -  Primary Care established Previous chart reviewed History and medications reviewed with the patient  Cardiovascular and Mediastinum   Essential hypertension    BP Readings from Last 1 Encounters:  03/11/21 (!) 156/85   Uncontrolled, on Spironolactone and Lasix Counseled for compliance with the medications      Relevant Medications   amLODipine (NORVASC) 5 MG tablet   Chronic diastolic CHF (congestive heart failure) (HCC)    On Lasix Euvolemic currently      Relevant Medications   amLODipine (NORVASC) 5 MG tablet     Respiratory   COPD with acute exacerbation (HCC) (Chronic)    SoluMedrol given in the office Started Azithromycin Continue Symbicort and Spiriva Albuterol PRN On chronic oral steroids F/u with Dr Lamonte Sakai       Relevant Medications   azithromycin (ZITHROMAX) 250 MG tablet   methylPREDNISolone sodium succinate (SOLU-MEDROL) 125 mg/2 mL injection 125 mg (Start on 03/11/2021 11:30 AM)   Chronic hypoxemic respiratory failure (HCC)    Due to underlying COPD On 3 l home O2        Digestive   Thrush    Likely due to steroid inhaler Needs to perform mouth wash after steroid use Nystatin swish and swallow      Relevant Medications   azithromycin (ZITHROMAX) 250 MG tablet   nystatin (MYCOSTATIN) 100000 UNIT/ML suspension   GERD (gastroesophageal reflux disease)    On Pantoprazole 40 mg QD        Endocrine   Diabetes mellitus without complication (HCC)    Lab Results  Component Value Date   HGBA1C 8.3 (H) 02/18/2021   Likely due to chronic steroid use On Glipizide Advised to follow diabetic diet On statin F/u CMP and lipid panel Diabetic eye exam: Advised to follow up with Ophthalmology for diabetic eye exam         Other   Hyperlipidemia    On Lipitor and Zetia      Relevant Medications   amLODipine (NORVASC) 5 MG tablet   H/O non-ST elevation myocardial infarction (NSTEMI)    S/p stent placement On Aspirin and Plavix Not on B-blocker Follows up with Cardiology      Chronic pain    Chronic back pain, h/o spine surgeries On Percocet 5-325 mg q6h PRN for severe pain PDMP reviewed Advised patient that if chronic pain management needed, will refer      Relevant Medications   methylPREDNISolone sodium succinate (SOLU-MEDROL) 125 mg/2 mL injection 125 mg (Start on 03/11/2021 11:30 AM)    Other Visit Diagnoses       Oral thrush       Relevant Medications   azithromycin (ZITHROMAX) 250 MG tablet   nystatin (MYCOSTATIN) 100000 UNIT/ML suspension   Impacted cerumen of right ear  Relevant Medications   carbamide peroxide ([name]) 6.5 % OTIC solution      Outpatient Encounter Medications as of 03/11/2021  Medication Sig  . albuterol (PROVENTIL) (2.5 MG/3ML) 0.083%  nebulizer solution Take 3 mLs (2.5 mg total) by nebulization every 4 (four) hours as needed for wheezing or shortness of breath. Dx: J44.9- COPD.  Marland Kitchen albuterol (VENTOLIN HFA) 108 (90 Base) MCG/ACT inhaler Inhale 2 puffs into the lungs every 4 (four) hours as needed for wheezing or shortness of breath.  Marland Kitchen alendronate (FOSAMAX) 70 MG tablet TAKE 1 TAB BY MOUTH EVERY WEEK ON AN EMPTY STOMACH.  Marland Kitchen amLODipine (NORVASC) 5 MG tablet Take 1 tablet (5 mg total) by mouth daily.  Marland Kitchen aspirin EC 81 MG tablet Take 81 mg by mouth every morning.  Marland Kitchen atorvastatin (LIPITOR) 80 MG tablet TAKE 1 TABLET BY MOUTH DAILY  . azithromycin (ZITHROMAX) 250 MG tablet Take as package instructions.  . Blood Glucose Monitoring Suppl (BLOOD GLUCOSE SYSTEM PAK) KIT Please dispense based on patient and insurance preference. Use as directed to monitor FSBS 1x daily. Dx: E11.9.  . Calcium Carb-Cholecalciferol 600-800 MG-UNIT TABS Take 1 tablet by mouth 2 (two) times daily.   . carbamide peroxide ([name]) 6.5 % OTIC solution Place 5 drops into both ears 2 (two) times daily.  . clopidogrel (PLAVIX) 75 MG tablet TAKE ONE TABLET BY MOUTH ONCE DAILY.  . clotrimazole-betamethasone (LOTRISONE) cream APPLY TO AFFECTED AREA TWICE DAILY.  Marland Kitchen ezetimibe (ZETIA) 10 MG tablet Take 1 tablet (10 mg total) by mouth daily.  . furosemide (LASIX) 40 MG tablet ALTERNATE TAKING 2 TABLETS ONE DAY THEN 1 TABLET NEXT DAY AND REPEAT  . glipiZIDE (GLUCOTROL) 10 MG tablet Take 1 tablet (10 mg total) by mouth 2 (two) times daily before a meal.  . Glucose Blood (BLOOD GLUCOSE TEST STRIPS) STRP Please dispense based on patient and insurance preference. Use as directed to monitor FSBS 1x daily. Dx: E11.9.  . guaiFENesin-codeine 100-10 MG/5ML syrup Take 5 mLs by mouth every 6 (six) hours as needed.  . Lancets MISC Please dispense based on patient and insurance preference. Use as directed to monitor FSBS 1x daily. Dx: E11.9.  . LINZESS 145 MCG CAPS capsule TAKE 1 CAPSULE  BY MOUTH BEFORE BREAKFAST.  . metoprolol tartrate (LOPRESSOR) 25 MG tablet Take 1 tablet (25 mg total) by mouth 2 (two) times daily.  . mupirocin ointment (BACTROBAN) 2 % Apply to affected areas on buttocks and toe twice a day for 1 week  . nitroGLYCERIN (NITROSTAT) 0.4 MG SL tablet Place 1 tablet (0.4 mg total) under the tongue every 5 (five) minutes as needed for chest pain. If no improvement with 3 doses call 911  . nystatin (MYCOSTATIN) 100000 UNIT/ML suspension Use as directed 5 mLs (500,000 Units total) in the mouth or throat 4 (four) times daily. Swish and swallow  . oxyCODONE-acetaminophen (PERCOCET) 5-325 MG tablet Take 1 tablet by mouth every 6 (six) hours as needed for severe pain.  . OXYGEN Inhale 3 L into the lungs continuous.   . pantoprazole (PROTONIX) 40 MG tablet Take 1 tablet (40 mg total) by mouth 2 (two) times daily.  . polyethylene glycol (MIRALAX / GLYCOLAX) packet Take 17 g by mouth daily. (Patient taking differently: Take 17 g by mouth daily as needed for mild constipation or moderate constipation.)  . predniSONE (DELTASONE) 10 MG tablet Take 17m once a day  . predniSONE (DELTASONE) 20 MG tablet Take 613mx 3 days, then 4035m 3 days,  then 33m x 3 days  . spironolactone (ALDACTONE) 25 MG tablet Take 1 tablet (25 mg total) by mouth daily.  . SYMBICORT 160-4.5 MCG/ACT inhaler INHALE 2 PUFFS INTO THE LUNGS TWICE DAILY.  .Marland KitchenTiotropium Bromide Monohydrate (SPIRIVA RESPIMAT) 2.5 MCG/ACT AERS Inhale 2 puffs into the lungs daily.  . [DISCONTINUED] nystatin (MYCOSTATIN) 100000 UNIT/ML suspension Use as directed 5 mLs (500,000 Units total) in the mouth or throat 4 (four) times daily. Swish and swallow  . potassium chloride SA (KLOR-CON) 20 MEQ tablet Take 1 tablet (20 mEq total) by mouth daily for 3 days.  . [DISCONTINUED] doxycycline (VIBRA-TABS) 100 MG tablet Take 1 tablet (100 mg total) by mouth 2 (two) times daily. (Patient not taking: Reported on 03/11/2021)  . [DISCONTINUED]  Magnesium 250 MG TABS Take 1 tablet daily (Patient not taking: Reported on 03/11/2021)   Facility-Administered Encounter Medications as of 03/11/2021  Medication  . methylPREDNISolone sodium succinate (SOLU-MEDROL) 125 mg/2 mL injection 125 mg    Follow-up: Return in about 3 months (around 06/11/2021) for HTN, COPD.   RLindell Spar MD

## 2021-03-11 NOTE — Assessment & Plan Note (Signed)
On Lasix Euvolemic currently

## 2021-03-11 NOTE — Assessment & Plan Note (Signed)
On Pantoprazole 40 mg QD

## 2021-03-11 NOTE — Assessment & Plan Note (Addendum)
SoluMedrol given in the office Started Azithromycin Continue Symbicort and Spiriva Albuterol PRN On chronic oral steroids F/u with Dr Lamonte Sakai

## 2021-03-11 NOTE — Assessment & Plan Note (Signed)
Due to underlying COPD On 3 l home O2

## 2021-03-11 NOTE — Assessment & Plan Note (Signed)
BP Readings from Last 1 Encounters:  03/11/21 (!) 156/85   Uncontrolled, on Spironolactone and Lasix Counseled for compliance with the medications

## 2021-03-11 NOTE — Assessment & Plan Note (Signed)
S/p stent placement On Aspirin and Plavix Not on B-blocker Follows up with Cardiology

## 2021-03-11 NOTE — Patient Instructions (Addendum)
Please start taking Azithromycin for COPD exacerbation. Continue to take Prednisone for now.  Please start taking Amlodipine for high blood pressure.  Please continue to take other medications as prescribed.  Please continue to follow up with Pulmonology and Cardiology.

## 2021-03-11 NOTE — Assessment & Plan Note (Signed)
On Lipitor and Zetia

## 2021-03-11 NOTE — Assessment & Plan Note (Signed)
Likely due to steroid inhaler Needs to perform mouth wash after steroid use Nystatin swish and swallow

## 2021-03-11 NOTE — Telephone Encounter (Signed)
She can continue taking her Metoprolol. She still needs to take Amlodipine.

## 2021-03-11 NOTE — Assessment & Plan Note (Signed)
Chronic back pain, h/o spine surgeries On Percocet 5-325 mg q6h PRN for severe pain PDMP reviewed Advised patient that if chronic pain management needed, will refer

## 2021-03-12 ENCOUNTER — Telehealth: Payer: Self-pay

## 2021-03-12 NOTE — Telephone Encounter (Signed)
[name] was sent yesterday. She is on oral antibiotics. Does not need antibiotic ear drops. Cindy [name] might have talked to them. Please clarify with her before calling. Thanks.

## 2021-03-12 NOTE — Telephone Encounter (Signed)
Have you spoke with pt already? Please advise.

## 2021-03-12 NOTE — Telephone Encounter (Signed)
We have sent oral antibiotics for her. It should suffice. And, I sent ear drops to clear it up.

## 2021-03-12 NOTE — Telephone Encounter (Signed)
Pt notified to take both also would like to know if you would send in her ear antibiotics

## 2021-03-12 NOTE — Telephone Encounter (Signed)
Pt called about wife, says she was supposed to get ear drops sent in to the pharmacy. Please advise.

## 2021-03-13 ENCOUNTER — Telehealth: Payer: Self-pay

## 2021-03-13 NOTE — Telephone Encounter (Signed)
Pt notified with verbal understanding  °

## 2021-03-13 NOTE — Telephone Encounter (Signed)
Pt was told this morning Dr Posey Pronto is not calling in ear drobs only the debrox. She was called in oral antibiotics and this should treat ear also. Please ask if there are any questions

## 2021-03-13 NOTE — Telephone Encounter (Signed)
Patient called she was in Monday to see Posey Pronto and states she needs antibiotic ear drops not ear wax drops ph# [phone]

## 2021-03-14 ENCOUNTER — Telehealth: Payer: Self-pay

## 2021-03-14 NOTE — Telephone Encounter (Signed)
Pt will need phone visit with provider

## 2021-03-14 NOTE — Telephone Encounter (Signed)
Azithromycin is not working , and the pt states she needs Doxycyline 100mg  called in    Also pt cant take Fluticaone as it causes Nose bleeds

## 2021-03-15 ENCOUNTER — Encounter: Payer: Self-pay | Admitting: Internal Medicine

## 2021-03-15 ENCOUNTER — Telehealth (INDEPENDENT_AMBULATORY_CARE_PROVIDER_SITE_OTHER): Payer: Medicare Other | Admitting: Internal Medicine

## 2021-03-15 ENCOUNTER — Other Ambulatory Visit: Payer: Self-pay

## 2021-03-15 DIAGNOSIS — J441 Chronic obstructive pulmonary disease with (acute) exacerbation: Secondary | ICD-10-CM

## 2021-03-15 DIAGNOSIS — J9611 Chronic respiratory failure with hypoxia: Secondary | ICD-10-CM | POA: Diagnosis not present

## 2021-03-15 DIAGNOSIS — H6690 Otitis media, unspecified, unspecified ear: Secondary | ICD-10-CM | POA: Diagnosis not present

## 2021-03-15 MED ORDER — CIPROFLOXACIN-DEXAMETHASONE 0.3-0.1 % OT SUSP: 4.0000 [drp] | Freq: Two times a day (BID) | OTIC | 0 refills | Status: DC

## 2021-03-15 MED ORDER — AMOXICILLIN-POT CLAVULANATE 875-125 MG PO TABS: 1.0000 | ORAL_TABLET | Freq: Two times a day (BID) | ORAL | 0 refills | Status: DC

## 2021-03-15 MED ORDER — CIPROFLOXACIN HCL 0.2 % OT SOLN
0.2000 mL | Freq: Two times a day (BID) | OTIC | 0 refills | Status: DC
Start: 2021-03-15 — End: 2021-04-09

## 2021-03-15 NOTE — Telephone Encounter (Signed)
Appt made

## 2021-03-15 NOTE — Progress Notes (Addendum)
Virtual Visit via Telephone Note   This visit type was conducted due to national recommendations for restrictions regarding the COVID-19 Pandemic (e.g. social distancing) in an effort to limit this patient's exposure and mitigate transmission in our community.  Due to her co-morbid illnesses, this patient is at least at moderate risk for complications without adequate follow up.  This format is felt to be most appropriate for this patient at this time.  The patient did not have access to video technology/had technical difficulties with video requiring transitioning to audio format only (telephone).  All issues noted in this document were discussed and addressed.  No physical exam could be performed with this format.  Evaluation Performed:  Follow-up visit  Date:  03/15/2021   ID:  Cindy [name], DOB 1956/12/10, MRN [id number]  Patient Location: Home Provider Location: Office/Clinic  Participants: Patient Location of Patient: Home Location of Provider: Telehealth Consent was obtain for visit to be over via telehealth. I verified that I am speaking with the correct person using two identifiers.  PCP:  Lindell Spar, MD   Chief Complaint:  Ear pain and chest congestion  History of Present Illness:    Cindy [name] is a 65 y.o. female who has a televisit for c/o ear pain and discharge. She c/o left ear pain and blackish discharge from left ear. She was given Azithromycin for COPD exacerbation, but she does not want to take it and states it does not work. She prefers to take Augmentin, which her previous PCP used to prescribe. Despite explaining to her, she is adamant about Augmentin. She also keeps on asking for antibiotic ear drops. She has been told that oral antibiotics are superior to otic application. She keeps on calling the office requesting for otic drops.  The patient does not have symptoms concerning for COVID-19 infection (fever, chills, cough, or new shortness of breath).    Past Medical, Surgical, Social History, Allergies, and Medications have been Reviewed.  Past Medical History:  Diagnosis Date   Adrenal adenoma 2014   Left   Arthritis    CAD (coronary artery disease)    a. NSTEMI 07/2018 s/p difficult PCI with overlapping DES to LAD, residual OM2 disease treated medically.   Chronic diastolic CHF (congestive heart failure) (HCC)    Chronic pain    Chronic respiratory failure ([place]) On home 02 2-3L   Compression fracture of lumbar vertebra (HCC)    COPD (chronic obstructive pulmonary disease) ([place])    Essential hypertension    Hyperglycemia, drug-induced    Hyperlipidemia    Kidney stones 2014   Left side, multiple   Leukocytosis    Myocardial infarct [facility])    Osteoporosis 2013   Thyroid disease    pt denies   Tobacco abuse    Past Surgical History:  Procedure Laterality Date   CERVICAL BIOPSY     COLONOSCOPY N/A 03/31/2013   Procedure: COLONOSCOPY;  Surgeon: Rogene Houston, MD;  Location: [facility];  Service: Endoscopy;  Laterality: N/A;  730   CORONARY ATHERECTOMY N/A 08/19/2018   Procedure: CORONARY ATHERECTOMY;  Surgeon: Nelva Bush, MD;  Location: [place] [facility];  Service: Cardiovascular;  Laterality: N/A;   CORONARY STENT INTERVENTION N/A 08/19/2018   Procedure: CORONARY STENT INTERVENTION;  Surgeon: Nelva Bush, MD;  Location: [place] [facility];  Service: Cardiovascular;  Laterality: N/A;   INTRAVASCULAR ULTRASOUND/IVUS N/A 08/19/2018   Procedure: Intravascular Ultrasound/IVUS;  Surgeon: Nelva Bush, MD;  [facility]: [facility]  INVASIVE CV LAB;  Service: Cardiovascular;  Laterality: N/A;   LEFT HEART CATH N/A 08/19/2018   Procedure: Left Heart Cath;  Surgeon: Nelva Bush, MD;  Location: [place] [facility] LAB;  Service: Cardiovascular;  Laterality: N/A;   LEFT HEART CATH AND CORONARY ANGIOGRAPHY N/A 08/16/2018   Procedure: LEFT HEART CATH AND CORONARY ANGIOGRAPHY;  Surgeon: Nelva Bush, MD;   Location: [place] [facility];  Service: Cardiovascular;  Laterality: N/A;   TUBAL LIGATION       Current Meds  Medication Sig   albuterol (PROVENTIL) (2.5 MG/3ML) 0.083% nebulizer solution Take 3 mLs (2.5 mg total) by nebulization every 4 (four) hours as needed for wheezing or shortness of breath. Dx: J44.9- COPD.   albuterol (VENTOLIN HFA) 108 (90 Base) MCG/ACT inhaler Inhale 2 puffs into the lungs every 4 (four) hours as needed for wheezing or shortness of breath.   alendronate (FOSAMAX) 70 MG tablet TAKE 1 TAB BY MOUTH EVERY WEEK ON AN EMPTY STOMACH.   amLODipine (NORVASC) 5 MG tablet Take 1 tablet (5 mg total) by mouth daily.   amoxicillin-clavulanate (AUGMENTIN) 875-125 MG tablet Take 1 tablet by mouth 2 (two) times daily.   aspirin EC 81 MG tablet Take 81 mg by mouth every morning.   atorvastatin (LIPITOR) 80 MG tablet TAKE 1 TABLET BY MOUTH DAILY   Blood Glucose Monitoring Suppl (BLOOD GLUCOSE SYSTEM PAK) KIT Please dispense based on patient and insurance preference. Use as directed to monitor FSBS 1x daily. Dx: E11.9.   Calcium Carb-Cholecalciferol 600-800 MG-UNIT TABS Take 1 tablet by mouth 2 (two) times daily.    carbamide peroxide ([name]) 6.5 % OTIC solution Place 5 drops into both ears 2 (two) times daily.   ciprofloxacin-dexamethasone (CIPRODEX) OTIC suspension Place 4 drops into both ears 2 (two) times daily.   clopidogrel (PLAVIX) 75 MG tablet TAKE ONE TABLET BY MOUTH ONCE DAILY.   clotrimazole-betamethasone (LOTRISONE) cream APPLY TO AFFECTED AREA TWICE DAILY.   ezetimibe (ZETIA) 10 MG tablet Take 1 tablet (10 mg total) by mouth daily.   furosemide (LASIX) 40 MG tablet ALTERNATE TAKING 2 TABLETS ONE DAY THEN 1 TABLET NEXT DAY AND REPEAT   glipiZIDE (GLUCOTROL) 10 MG tablet Take 1 tablet (10 mg total) by mouth 2 (two) times daily before a meal.   Glucose Blood (BLOOD GLUCOSE TEST STRIPS) STRP Please dispense based on patient and insurance preference. Use as  directed to monitor FSBS 1x daily. Dx: E11.9.   guaiFENesin-codeine 100-10 MG/5ML syrup Take 5 mLs by mouth every 6 (six) hours as needed.   Lancets MISC Please dispense based on patient and insurance preference. Use as directed to monitor FSBS 1x daily. Dx: E11.9.   LINZESS 145 MCG CAPS capsule TAKE 1 CAPSULE BY MOUTH BEFORE BREAKFAST.   metoprolol tartrate (LOPRESSOR) 25 MG tablet Take 1 tablet (25 mg total) by mouth 2 (two) times daily.   mupirocin ointment (BACTROBAN) 2 % Apply to affected areas on buttocks and toe twice a day for 1 week   nitroGLYCERIN (NITROSTAT) 0.4 MG SL tablet Place 1 tablet (0.4 mg total) under the tongue every 5 (five) minutes as needed for chest pain. If no improvement with 3 doses call 911   nystatin (MYCOSTATIN) 100000 UNIT/ML suspension Use as directed 5 mLs (500,000 Units total) in the mouth or throat 4 (four) times daily. Swish and swallow   oxyCODONE-acetaminophen (PERCOCET) 5-325 MG tablet Take 1 tablet by mouth every 6 (six) hours as needed for severe pain.   OXYGEN Inhale  3 L into the lungs continuous.    pantoprazole (PROTONIX) 40 MG tablet Take 1 tablet (40 mg total) by mouth 2 (two) times daily.   polyethylene glycol (MIRALAX / GLYCOLAX) packet Take 17 g by mouth daily. (Patient taking differently: Take 17 g by mouth daily as needed for mild constipation or moderate constipation.)   predniSONE (DELTASONE) 10 MG tablet Take 46m once a day   predniSONE (DELTASONE) 20 MG tablet Take 653mx 3 days, then 4045m 3 days, then 28m26m3 days   spironolactone (ALDACTONE) 25 MG tablet Take 1 tablet (25 mg total) by mouth daily.   SYMBICORT 160-4.5 MCG/ACT inhaler INHALE 2 PUFFS INTO THE LUNGS TWICE DAILY.   Tiotropium Bromide Monohydrate (SPIRIVA RESPIMAT) 2.5 MCG/ACT AERS Inhale 2 puffs into the lungs daily.   [DISCONTINUED] azithromycin (ZITHROMAX) 250 MG tablet Take as package instructions.     Allergies:   Keflex [cephalexin], Sulfa antibiotics,  Avelox [moxifloxacin hcl in nacl], and Toradol [ketorolac tromethamine]   ROS:   Please see the history of present illness.     All other systems reviewed and are negative.   Labs/Other Tests and Data Reviewed:    Recent Labs: 02/18/2021: ALT 33; BUN 14; Creat 1.03; Hemoglobin 13.4; Platelets 313; Potassium 3.8; Sodium 138   Recent Lipid Panel Lab Results  Component Value Date/Time   CHOL 153 08/24/2020 09:54 AM   TRIG 215 (H) 08/24/2020 09:54 AM   HDL 46 (L) 08/24/2020 09:54 AM   CHOLHDL 3.3 08/24/2020 09:54 AM   LDLCALC 76 08/24/2020 09:54 AM    Wt Readings from Last 3 Encounters:  03/11/21 182 lb 6.4 oz (82.7 kg)  02/18/21 181 lb (82.1 kg)  02/15/21 189 lb (85.7 kg)      ASSESSMENT & PLAN:    COPD exacerbation Switched to Augmentin as patient would not take Azithromycin Continue Prednisone Continue inhalers  Otitis media Ciprodex ear drops  Time:   Today, I have spent 12 minutes reviewing the chart, including problem list, medications, and with the patient with telehealth technology discussing the above problems.   Medication Adjustments/Labs and Tests Ordered: Current medicines are reviewed at length with the patient today.  Concerns regarding medicines are outlined above.   Tests Ordered: No orders of the defined types were placed in this encounter.   Medication Changes: Meds ordered this encounter  Medications   amoxicillin-clavulanate (AUGMENTIN) 875-125 MG tablet    Sig: Take 1 tablet by mouth 2 (two) times daily.    Dispense:  20 tablet    Refill:  0   ciprofloxacin-dexamethasone (CIPRODEX) OTIC suspension    Sig: Place 4 drops into both ears 2 (two) times daily.    Dispense:  7.5 mL    Refill:  0     Note: This dictation was prepared with Dragon dictation along with smaller phrase technology. Similar sounding words can be transcribed inadequately or may not be corrected upon review. Any transcriptional errors that result from this process  are unintentional.      Disposition:  Follow up  Signed, RutwLindell Spar  03/15/2021 9:48 AM     [place]

## 2021-03-15 NOTE — Patient Instructions (Signed)
Please stop taking Azithromycin. Please start taking Augmentin and use Cipro-Dex ear drops for ear infection.

## 2021-03-15 NOTE — Addendum Note (Signed)
Addended byIhor Dow on: 03/15/2021 10:44 AM   Modules accepted: Orders

## 2021-03-18 ENCOUNTER — Telehealth: Payer: Self-pay | Admitting: Cardiovascular Disease

## 2021-03-18 NOTE — Telephone Encounter (Signed)
Spoke with Cindy [name]. She wants to stay on Amlodipine another week before she switches to Cozaar. She said she has 90 day supply and does not want to throw it out. I reminded her to take daily BP"s.

## 2021-03-18 NOTE — Telephone Encounter (Signed)
She should f/u with her primary Posey Pronto for BP Ok to stop norvasc and start Cozaar 50 mg daily  In addition to her diuretics and beta blocker Check BMET in 2 weeks

## 2021-03-18 NOTE — Telephone Encounter (Signed)
NEW MESSAGE   CALL IN AN HOUR AND HALF THEY ARE RUNNING ERRANDS   Pt c/o BP issue: STAT if pt c/o blurred vision, one-sided weakness or slurred speech  1. What are your last 5 BP readings? DID NOT HAVE   2. Are you having any other symptoms (ex. Dizziness, headache, blurred vision, passed out)? DIZZINESS   3. What is your BP issue?  PCP DOES NOT THINK HER BP IS CONTROLLED THEY PUT HER ON ANOTHER MEDICATION, HER BP IS STAYING IN 140'S-168

## 2021-03-18 NOTE — Telephone Encounter (Signed)
Patient saw Dr.Patel for first visit on 03/11/21. He placed her on Amlodipine 5 mg daily. She states her BP has remained elevated 147/91, 168/100, 156/90   She does not want to stay on amlodipine. She wants "something better" or she wants the metoprolol dose raised. She could not provide me with any HR's.    Please advise

## 2021-03-19 ENCOUNTER — Other Ambulatory Visit: Payer: Medicare Other | Admitting: Urology

## 2021-03-26 ENCOUNTER — Other Ambulatory Visit: Payer: Self-pay

## 2021-03-26 ENCOUNTER — Ambulatory Visit (HOSPITAL_COMMUNITY)
Admission: RE | Admit: 2021-03-26 | Discharge: 2021-03-26 | Disposition: A | Discharge status: Home / Self Care | Payer: Medicare Other | Source: Ambulatory Visit | Attending: Cardiovascular Disease | Admitting: Cardiovascular Disease

## 2021-03-26 DIAGNOSIS — I350 Nonrheumatic aortic (valve) stenosis: Secondary | ICD-10-CM | POA: Diagnosis not present

## 2021-03-26 LAB — ECHOCARDIOGRAM COMPLETE
AR max vel: 0.76 cm2
AV Area VTI: 0.76 cm2
AV Area mean vel: 0.75 cm2
AV Mean grad: 10 mmHg
AV Peak grad: 18.7 mmHg
Ao pk vel: 2.16 m/s
Area-P 1/2: 2.99 cm2
S' Lateral: 2.8 cm

## 2021-03-26 NOTE — Progress Notes (Signed)
*  PRELIMINARY RESULTS* Echocardiogram 2D Echocardiogram has been performed.  Cindy [name] 03/26/2021, 11:28 AM

## 2021-03-28 ENCOUNTER — Telehealth: Payer: Self-pay

## 2021-03-28 ENCOUNTER — Other Ambulatory Visit: Payer: Self-pay | Admitting: Internal Medicine

## 2021-03-28 DIAGNOSIS — J019 Acute sinusitis, unspecified: Secondary | ICD-10-CM

## 2021-03-28 MED ORDER — GUAIFENESIN-CODEINE 100-10 MG/5ML PO SOLN: 5.0000 mL | Freq: Four times a day (QID) | ORAL | 0 refills | Status: DC | PRN

## 2021-03-28 MED ORDER — DOXYCYCLINE HYCLATE 100 MG PO TABS: 100.0000 mg | ORAL_TABLET | Freq: Two times a day (BID) | ORAL | 0 refills | Status: DC

## 2021-03-28 NOTE — Telephone Encounter (Signed)
Please advise her that it is refilled. Sent Doxycycline for sinusitis.

## 2021-03-28 NOTE — Telephone Encounter (Signed)
Contacted patient to make an appointment. Patient said she does not need an appointment right now that  the closed tightness of nose is normal and does not need her bottom looked at right now.  Will call back to schedule an appointment if needed.

## 2021-03-28 NOTE — Telephone Encounter (Signed)
Patient wrote note: Dr Posey Pronto:     I can't breathe air thru nose "closed tight". I've used saline nasal spray that is nasal decongestant allergy sinus 12 hour relief it not open me up. My left bun back of tail broke back out.  I'm using cream [facility] prescribed.  I have to go back on Doxycyline capsule and 20 mg prednisone taper.

## 2021-03-28 NOTE — Telephone Encounter (Signed)
Pt informed

## 2021-03-28 NOTE — Telephone Encounter (Signed)
Please advise 

## 2021-03-28 NOTE — Telephone Encounter (Signed)
Patient brother Louie Casa walk in need med refill  guaiFENesin-codeine 100-10 MG/5ML syrup  Said nose swells shuts on the inside.  Pharmacy: [facility]

## 2021-04-02 ENCOUNTER — Other Ambulatory Visit: Payer: Self-pay

## 2021-04-02 ENCOUNTER — Ambulatory Visit (INDEPENDENT_AMBULATORY_CARE_PROVIDER_SITE_OTHER): Payer: Medicare Other | Admitting: Emergency Medicine

## 2021-04-02 ENCOUNTER — Encounter: Payer: Self-pay | Admitting: Emergency Medicine

## 2021-04-02 DIAGNOSIS — J9611 Chronic respiratory failure with hypoxia: Secondary | ICD-10-CM | POA: Diagnosis not present

## 2021-04-02 DIAGNOSIS — J411 Mucopurulent chronic bronchitis: Secondary | ICD-10-CM | POA: Diagnosis not present

## 2021-04-02 NOTE — Assessment & Plan Note (Signed)
Very labile with frequent AE's and bronchitis. I have considered increasing her pred but she is hesitant to do so. Also considered rotating cycled abx (she is effectively doing this already). Her smoking is a huge problem. At this time, we won't change her maintenance regimen. She turns to [place] and to family medicine for acute sx frequently.   Continue Pred 10 Continue Spiriva and Symbicort.  Albuterol nebs / HFA prn.

## 2021-04-02 NOTE — Progress Notes (Signed)
Virtual Visit via Video Note  I connected with Cindy [name] on 04/02/21 at 10:15 AM EDT by a video enabled telemedicine application and verified that I am speaking with the correct person using two identifiers.  Location: Patient: Home Provider: Office   I discussed the limitations of evaluation and management by telemedicine and the availability of in person appointments. The patient expressed understanding and agreed to proceed.  History of Present Illness: 65 year old woman whom I follow for severe COPD, continued tobacco use, chronic (frequent) bronchitis.  We have been managing her on scheduled prednisone, currently 10 mg daily.  She is on Symbicort and Spiriva. Chronic hypoxemia, 3L/min.    Observations/Objective: Patient reports today that she has been doing about the same >> she requires additional steroids and abx every few months. She is currently using ventolin about once a day.  She was given solumedrol in office 03/11/21, azithromycin. Then treated with Augmentin. Most recently treated with doxycycline 3/31 for rash.   She is smoking 2-3 cig a day, currently, sometimes up to a pack a day. .    Echocardiogram 03/26/2021 shows normal LVEF with moderate aortic stenosis, being followed by Dr. Johnsie Cancel  Assessment and Plan: COPD (chronic obstructive pulmonary disease) ([place]) Very labile with frequent AE's and bronchitis. I have considered increasing her pred but she is hesitant to do so. Also considered rotating cycled abx (she is effectively doing this already). Her smoking is a huge problem. At this time, we won't change her maintenance regimen. She turns to [place] and to family medicine for acute sx frequently.   Continue Pred 10 Continue Spiriva and Symbicort.  Albuterol nebs / HFA prn.    Chronic hypoxemic respiratory failure (HCC) Continue 3L/min at all time.     Follow Up Instructions: 3 months   I discussed the assessment and treatment plan with the patient. The  patient was provided an opportunity to ask questions and all were answered. The patient agreed with the plan and demonstrated an understanding of the instructions.   The patient was advised to call back or seek an in-person evaluation if the symptoms worsen or if the condition fails to improve as anticipated.  I provided 22 minutes of non-face-to-face time during this encounter.   Collene Gobble, MD

## 2021-04-02 NOTE — Assessment & Plan Note (Signed)
Continue 3L/min at all time.

## 2021-04-03 ENCOUNTER — Telehealth: Payer: Self-pay | Admitting: Cardiovascular Disease

## 2021-04-03 NOTE — Telephone Encounter (Signed)
Patient was c/o her bp still being too high. Patient was complaining that the Amlodipine 5 mg tablets were not strong enough to bring her bp down. I informed the patient per Dr. Kyla Balzarine last phone note, he stated:  Josue Hector, MD 03/18/21 3:54 PM Note She should f/u with her primary Posey Pronto for BP Ok to stop norvasc and start Cozaar 50 mg daily  In addition to her diuretics and beta blocker Check BMET in 2 weeks      Patient reiterated that she did not want to stop taking amlodipine 5 mg tablets because she still had a 90 day supply, but she wanted the dose changed. I informed patient that she should contact her PCP and start there with a work up to determine if titration of her amlodipine was needed. She verbalized understanding.

## 2021-04-03 NOTE — Telephone Encounter (Signed)
New message   Patient boyfriend dropped off copy of blood pressure tracker , states patient is not feeling well, thinks her bp is too high. I set her an appt with APP but they would still like a call back from nurse   4/2 142/85 hr 109 4/4 162/104 hr 110 4/5 137/88 hr 98 4/6 149/88 hr 96 these are the readings from the bp log

## 2021-04-04 ENCOUNTER — Other Ambulatory Visit: Payer: Self-pay | Admitting: Internal Medicine

## 2021-04-04 ENCOUNTER — Telehealth: Payer: Self-pay

## 2021-04-04 DIAGNOSIS — J309 Allergic rhinitis, unspecified: Secondary | ICD-10-CM

## 2021-04-04 MED ORDER — IPRATROPIUM BROMIDE 0.03 % NA SOLN: 2.0000 | Freq: Two times a day (BID) | NASAL | 12 refills | Status: AC

## 2021-04-04 NOTE — Telephone Encounter (Signed)
Patient brother Louie Casa) walk in patient needs refill  Ipratropium 0.03% nasal spray 30 ml  [facility] [address] Linna Hoff

## 2021-04-04 NOTE — Telephone Encounter (Signed)
Sent to [facility].

## 2021-04-04 NOTE — Telephone Encounter (Signed)
This is not in pts med list. Do you want to send in for pt?

## 2021-04-09 ENCOUNTER — Other Ambulatory Visit: Payer: Self-pay

## 2021-04-09 ENCOUNTER — Encounter: Payer: Self-pay | Admitting: Internal Medicine

## 2021-04-09 ENCOUNTER — Ambulatory Visit (INDEPENDENT_AMBULATORY_CARE_PROVIDER_SITE_OTHER): Payer: Medicare Other | Admitting: Internal Medicine

## 2021-04-09 VITALS — BP 162/92 | HR 87 | Resp 16 | Ht 62.0 in

## 2021-04-09 DIAGNOSIS — J019 Acute sinusitis, unspecified: Secondary | ICD-10-CM | POA: Diagnosis not present

## 2021-04-09 DIAGNOSIS — I10 Essential (primary) hypertension: Secondary | ICD-10-CM | POA: Diagnosis not present

## 2021-04-09 DIAGNOSIS — L89149 Pressure ulcer of left lower back, unspecified stage: Secondary | ICD-10-CM

## 2021-04-09 DIAGNOSIS — J441 Chronic obstructive pulmonary disease with (acute) exacerbation: Secondary | ICD-10-CM

## 2021-04-09 DIAGNOSIS — J411 Mucopurulent chronic bronchitis: Secondary | ICD-10-CM

## 2021-04-09 MED ORDER — METHYLPREDNISOLONE SODIUM SUCC 125 MG IJ SOLR
125.0000 mg | Freq: Once | INTRAMUSCULAR | Status: AC
  Administered 2021-04-09: 125 mg via INTRAMUSCULAR

## 2021-04-09 MED ORDER — LOSARTAN POTASSIUM 50 MG PO TABS: 50.0000 mg | ORAL_TABLET | Freq: Every day | ORAL | 1 refills | Status: DC

## 2021-04-09 MED ORDER — PREDNISONE 20 MG PO TABS
ORAL_TABLET | ORAL | 0 refills | Status: DC
Start: 2021-04-09 — End: 2021-04-22

## 2021-04-09 NOTE — Progress Notes (Signed)
Established Patient Office Visit  Subjective:  Patient ID: Cindy [name], female    DOB: [birth year]  Age: 65 y.o. MRN: [id number]  CC:  Chief Complaint  Patient presents with  . Follow-up    Blood pressure follow up pt has been stopped up congested runny nose and had red face for awhile     HPI [name] is a 65 year old female with past medical history of CAD s/p stent placement, HFpEF, chronic hypoxic respiratory failure due to underlying COPD -on 3 L home O2, type II DM, ureteral stone, HLD, chronic back pain, physical deconditioning -uses wheelchair and obesity who  presents for evaluation of her BP and sinus symptoms.  She continues to have nasal congestion and swelling in the nostril. Of note, she uses home O2 for COPD. She also has dyspnea and wheezing worse for last few days. She denies fever, chills, nausea, vomiting, sore throat. Denies any recent sick contacts.  Her BP was elevated today. She was supposed to be on Losartan, but [facility] did not show it on it. According to Cardiology note, it seems that she is supposed to be on Losartan. She has generalized headache, which could be due to elevated BP and/or sinusitis.  She also has been having bruising over arms and legs. Of note, she has been taking Aspirin and Plavix. She had stent placement in 2018.  Past Medical History:  Diagnosis Date  . 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 Arthritis   . CAD (coronary artery disease)    a. NSTEMI 07/2018 s/p difficult PCI with overlapping DES to LAD, residual OM2 disease treated medically.  . Chronic diastolic CHF (congestive heart failure) ([place])   . Chronic pain   . Chronic respiratory failure ([place]) On home 02 2-3L  . Compression fracture of lumbar vertebra ([place])   . COPD (chronic obstructive pulmonary disease) ([place])   . Essential hypertension   . Hyperglycemia, drug-induced   . Hyperlipidemia   . Kidney stones 2014   Left side, multiple  . Leukocytosis   . Myocardial infarct  ([place])   . Osteoporosis 2013  . Thyroid disease    pt denies  . Tobacco abuse     Past Surgical History:  Procedure Laterality Date  . CERVICAL BIOPSY    . COLONOSCOPY N/A 03/31/2013   Procedure: COLONOSCOPY;  Surgeon: Rogene Houston, MD;  [facility];  Service: Endoscopy;  Laterality: N/A;  730  . CORONARY ATHERECTOMY N/A 08/19/2018   Procedure: CORONARY ATHERECTOMY;  Surgeon: Nelva Bush, MD;  Location: [facility];  Service: Cardiovascular;  Laterality: N/A;  . CORONARY STENT INTERVENTION N/A 08/19/2018   Procedure: CORONARY STENT INTERVENTION;  Surgeon: Nelva Bush, MD;  Location: [facility];  Service: Cardiovascular;  Laterality: N/A;  . INTRAVASCULAR ULTRASOUND/IVUS N/A 08/19/2018   Procedure: Intravascular Ultrasound/IVUS;  Surgeon: Nelva Bush, MD;  Location: [facility];  Service: Cardiovascular;  Laterality: N/A;  . LEFT HEART CATH N/A 08/19/2018   Procedure: Left Heart Cath;  Surgeon: Nelva Bush, MD;  Location: [facility];  Service: Cardiovascular;  Laterality: N/A;  . LEFT HEART CATH AND CORONARY ANGIOGRAPHY N/A 08/16/2018   Procedure: LEFT HEART CATH AND CORONARY ANGIOGRAPHY;  Surgeon: Nelva Bush, MD;  Location: [facility];  Service: Cardiovascular;  Laterality: N/A;  . TUBAL LIGATION      Family History  Problem Relation Age of Onset  . Heart disease Mother   . Hypertension Sister   .  Hypertension Brother     Social History   Socioeconomic History  . Marital status: Divorced    Spouse name: Not on file  . Number of children: 3  . Years of education: Not on file  . Highest education level: Not on file  Occupational History  . Occupation: disabled    Employer: DISABLED  Tobacco Use  . Smoking status: Current Some Day Smoker    Packs/day: 1.00    Years: 25.00    Pack years: 25.00    Types: Cigarettes  . Smokeless tobacco: Never Used  . Tobacco comment: 2-5 cigarettes smoked daily 04/02/21  [facility]   Vaping Use  . Vaping Use: Never used  Substance and Sexual Activity  . Alcohol use: No  . Drug use: No  . Sexual activity: Not Currently    Birth control/protection: Surgical  Other Topics Concern  . Not on file  Social History Narrative  . Not on file   Social Determinants of Health   Financial Resource Strain: Not on file  Food Insecurity: Not on file  Transportation Needs: Not on file  Physical Activity: Not on file  Stress: Not on file  Social Connections: Not on file  Intimate Partner Violence: Not on file    Outpatient Medications Prior to Visit  Medication Sig Dispense Refill  . albuterol (PROVENTIL) (2.5 MG/3ML) 0.083% nebulizer solution Take 3 mLs (2.5 mg total) by nebulization every 4 (four) hours as needed for wheezing or shortness of breath. Dx: J44.9- COPD. 525 mL 0  . albuterol (VENTOLIN HFA) 108 (90 Base) MCG/ACT inhaler Inhale 2 puffs into the lungs every 4 (four) hours as needed for wheezing or shortness of breath. 1 each 1  . alendronate (FOSAMAX) 70 MG tablet TAKE 1 TAB BY MOUTH EVERY WEEK ON AN EMPTY STOMACH. 12 tablet 0  . atorvastatin (LIPITOR) 80 MG tablet TAKE 1 TABLET BY MOUTH DAILY 90 tablet 0  . Blood Glucose Monitoring Suppl (BLOOD GLUCOSE SYSTEM PAK) KIT Please dispense based on patient and insurance preference. Use as directed to monitor FSBS 1x daily. Dx: E11.9. 1 kit 1  . Calcium Carb-Cholecalciferol 600-800 MG-UNIT TABS Take 1 tablet by mouth 2 (two) times daily.     . carbamide peroxide ([name]) 6.5 % OTIC solution Place 5 drops into both ears 2 (two) times daily. 15 mL 0  . clopidogrel (PLAVIX) 75 MG tablet TAKE ONE TABLET BY MOUTH ONCE DAILY. 90 tablet 0  . clotrimazole-betamethasone (LOTRISONE) cream APPLY TO AFFECTED AREA TWICE DAILY. 45 g 0  . doxycycline (VIBRA-TABS) 100 MG tablet Take 1 tablet (100 mg total) by mouth 2 (two) times daily. 20 tablet 0  . ezetimibe (ZETIA) 10 MG tablet Take 1 tablet (10 mg total) by mouth daily. 90  tablet 0  . furosemide (LASIX) 40 MG tablet ALTERNATE TAKING 2 TABLETS ONE DAY THEN 1 TABLET NEXT DAY AND REPEAT 142 tablet 0  . glipiZIDE (GLUCOTROL) 10 MG tablet Take 1 tablet (10 mg total) by mouth 2 (two) times daily before a meal. 60 tablet 3  . Glucose Blood (BLOOD GLUCOSE TEST STRIPS) STRP Please dispense based on patient and insurance preference. Use as directed to monitor FSBS 1x daily. Dx: E11.9. 100 strip 1  . guaiFENesin-codeine 100-10 MG/5ML syrup Take 5 mLs by mouth every 6 (six) hours as needed. 180 mL 0  . ipratropium (ATROVENT) 0.03 % nasal spray Place 2 sprays into both nostrils every 12 (twelve) hours. 30 mL 12  . Lancets MISC Please dispense  based on patient and insurance preference. Use as directed to monitor FSBS 1x daily. Dx: E11.9. 100 each 11  . LINZESS 145 MCG CAPS capsule TAKE 1 CAPSULE BY MOUTH BEFORE BREAKFAST. 30 capsule 0  . metoprolol tartrate (LOPRESSOR) 25 MG tablet Take 1 tablet (25 mg total) by mouth 2 (two) times daily. 180 tablet 3  . mupirocin ointment (BACTROBAN) 2 % Apply to affected areas on buttocks and toe twice a day for 1 week 22 g 0  . nitroGLYCERIN (NITROSTAT) 0.4 MG SL tablet Place 1 tablet (0.4 mg total) under the tongue every 5 (five) minutes as needed for chest pain. If no improvement with 3 doses call 911 50 tablet 3  . nystatin (MYCOSTATIN) 100000 UNIT/ML suspension Use as directed 5 mLs (500,000 Units total) in the mouth or throat 4 (four) times daily. Swish and swallow 60 mL 0  . oxyCODONE-acetaminophen (PERCOCET) 5-325 MG tablet Take 1 tablet by mouth every 6 (six) hours as needed for severe pain. 30 tablet 0  . OXYGEN Inhale 3 L into the lungs continuous.     . pantoprazole (PROTONIX) 40 MG tablet Take 1 tablet (40 mg total) by mouth 2 (two) times daily. 180 tablet 0  . polyethylene glycol (MIRALAX / GLYCOLAX) packet Take 17 g by mouth daily. (Patient taking differently: Take 17 g by mouth daily as needed for mild constipation or moderate  constipation.) 14 each 0  . predniSONE (DELTASONE) 10 MG tablet Take 59m once a day 30 tablet 0  . spironolactone (ALDACTONE) 25 MG tablet Take 1 tablet (25 mg total) by mouth daily. 90 tablet 3  . SYMBICORT 160-4.5 MCG/ACT inhaler INHALE 2 PUFFS INTO THE LUNGS TWICE DAILY. 30.6 g 0  . Tiotropium Bromide Monohydrate (SPIRIVA RESPIMAT) 2.5 MCG/ACT AERS Inhale 2 puffs into the lungs daily. 12 g 11  . amLODipine (NORVASC) 5 MG tablet Take 1 tablet (5 mg total) by mouth daily. 90 tablet 0  . aspirin EC 81 MG tablet Take 81 mg by mouth every morning.    . Ciprofloxacin HCl 0.2 % otic solution Place 0.2 mLs into both ears 2 (two) times daily. 14 each 0  . predniSONE (DELTASONE) 20 MG tablet Take 660mx 3 days, then 4049m 3 days, then 42m31m3 days 18 tablet 0  . potassium chloride SA (KLOR-CON) 20 MEQ tablet Take 1 tablet (20 mEq total) by mouth daily for 3 days. 3 tablet 0   No facility-administered medications prior to visit.    Allergies  Allergen Reactions  . Keflex [Cephalexin] Anaphylaxis and Rash  . Sulfa Antibiotics   . Avelox [Moxifloxacin Hcl In Nacl] Itching and Rash  . Toradol [Ketorolac Tromethamine] Swelling, Rash and Other (See Comments)    Bruising, swelling, rash at injection site    ROS Review of Systems  Constitutional: Positive for fatigue. Negative for chills and fever.  HENT: Positive for congestion and postnasal drip. Negative for ear discharge, ear pain and rhinorrhea.   Eyes: Negative for pain and discharge.  Respiratory: Positive for cough, shortness of breath and wheezing.   Cardiovascular: Negative for chest pain and palpitations.  Gastrointestinal: Positive for constipation. Negative for diarrhea, nausea and vomiting.  Endocrine: Negative for polydipsia and polyuria.  Genitourinary: Negative for dysuria and hematuria.  Musculoskeletal: Positive for back pain. Negative for neck pain and neck stiffness.  Skin: Negative for rash.  Neurological: Negative for  dizziness and syncope.  Psychiatric/Behavioral: Negative for agitation. The patient is nervous/anxious.  Objective:    Physical Exam Vitals reviewed.  Constitutional:      General: She is not in acute distress.    Appearance: She is obese. She is not diaphoretic.     Comments: In wheelchair  HENT:     Head: Normocephalic and atraumatic.     Nose: Nose normal.  Eyes:     General: No scleral icterus.    Extraocular Movements: Extraocular movements intact.  Cardiovascular:     Rate and Rhythm: Normal rate and regular rhythm.     Pulses: Normal pulses.     Heart sounds: Normal heart sounds. No murmur heard.   Pulmonary:     Breath sounds: Wheezing (B/l diffuse) present. No rales.     Comments: On 3 l O2 Abdominal:     Palpations: Abdomen is soft.     Tenderness: There is no abdominal tenderness.  Musculoskeletal:     Cervical back: Neck supple. No tenderness.     Right lower leg: No edema.     Left lower leg: No edema.  Skin:    General: Skin is warm.     Findings: Bruising (Over b/l arms and legs, chronic) present.     Comments: Healing pressure ulcer on lower back/right buttock  Neurological:     General: No focal deficit present.     Mental Status: She is alert and oriented to person, place, and time.  Psychiatric:        Mood and Affect: Mood normal.        Behavior: Behavior normal.     BP (!) 162/92 (BP Location: Right Arm, Patient Position: Sitting, Cuff Size: Normal)   Pulse 87   Resp 16   Ht 5' 2"  (1.575 m)   SpO2 98%   BMI 33.36 kg/m  Wt Readings from Last 3 Encounters:  03/11/21 182 lb 6.4 oz (82.7 kg)  02/18/21 181 lb (82.1 kg)  02/15/21 189 lb (85.7 kg)     Health Maintenance Due  Topic Date Due  . OPHTHALMOLOGY EXAM  Never done  . PAP SMEAR-Modifier  09/24/2018  . MAMMOGRAM  04/23/2019  . COVID-19 Vaccine (3 - Moderna risk 4-dose series) 05/04/2020  . FOOT EXAM  09/26/2020  . PNA vac Low Risk Adult (1 of 2 - PCV13) 04/05/2021     There are no preventive care reminders to display for this patient.  Lab Results  Component Value Date   TSH 2.323 08/27/2018   Lab Results  Component Value Date   WBC 17.4 (H) 02/18/2021   HGB 13.4 02/18/2021   HCT 39.9 02/18/2021   MCV 92.6 02/18/2021   PLT 313 02/18/2021   Lab Results  Component Value Date   NA 138 02/18/2021   K 3.8 02/18/2021   CO2 43 (H) 02/18/2021   GLUCOSE 221 (H) 02/18/2021   BUN 14 02/18/2021   CREATININE 1.03 (H) 02/18/2021   BILITOT 0.4 02/18/2021   ALKPHOS 69 10/20/2020   AST 27 02/18/2021   ALT 33 (H) 02/18/2021   PROT 6.6 02/18/2021   ALBUMIN 4.1 10/20/2020   CALCIUM 9.1 02/18/2021   ANIONGAP 13 10/20/2020   Lab Results  Component Value Date   CHOL 153 08/24/2020   Lab Results  Component Value Date   HDL 46 (L) 08/24/2020   Lab Results  Component Value Date   LDLCALC 76 08/24/2020   Lab Results  Component Value Date   TRIG 215 (H) 08/24/2020   Lab Results  Component Value Date  CHOLHDL 3.3 08/24/2020   Lab Results  Component Value Date   HGBA1C 8.3 (H) 02/18/2021      Assessment & Plan:   Problem List Items Addressed This Visit      Cardiovascular and Mediastinum   Primary hypertension - Primary    BP Readings from Last 1 Encounters:  04/09/21 (!) 162/92   Uncontrolled, on Spironolactone and Lasix Added Losartan after reviewing Cardiology communication with the patient, DC Amlodipine Counseled for compliance with the medications      Relevant Medications   losartan (COZAAR) 50 MG tablet     Respiratory   COPD with acute exacerbation ([place])    SoluMedrol given in the office, followed by steroid taper Continue Symbicort and Spiriva Albuterol PRN On chronic oral steroids F/u with Dr Lamonte Sakai      Relevant Medications   predniSONE (DELTASONE) 20 MG tablet    Other Visit Diagnoses    Acute sinusitis, recurrence not specified, unspecified location       Continue Doxycycline Nasal saline spray          Pressure injury of skin of left lower back, unspecified injury stage     Healing wound Has been applying Mupirocin cream Advised to apply Neosporin      Meds ordered this encounter  Medications  . losartan (COZAAR) 50 MG tablet    Sig: Take 1 tablet (50 mg total) by mouth daily.    Dispense:  90 tablet    Refill:  1  . predniSONE (DELTASONE) 20 MG tablet    Sig: Take 27m x 3 days, then 447mx 3 days, then 2073m 3 days    Dispense:  18 tablet    Refill:  0  . methylPREDNISolone sodium succinate (SOLU-MEDROL) 125 mg/2 mL injection 125 mg    Follow-up: Return in about 3 months (around 07/09/2021).    RutLindell SparD

## 2021-04-09 NOTE — Assessment & Plan Note (Signed)
BP Readings from Last 1 Encounters:  04/09/21 (!) 162/92   Uncontrolled, on Spironolactone and Lasix Added Losartan after reviewing Cardiology communication with the patient, DC Amlodipine Counseled for compliance with the medications

## 2021-04-09 NOTE — Assessment & Plan Note (Signed)
SoluMedrol given in the office, followed by steroid taper Continue Symbicort and Spiriva Albuterol PRN On chronic oral steroids F/u with Dr Lamonte Sakai

## 2021-04-09 NOTE — Patient Instructions (Signed)
Please start taking Prednisone as prescribed.  Continue to apply Mupirocin cream over healing wound.  Please use nasal saline spray for nasal congestion.  Please stop taking Amlodipine and start taking Losartan as prescribed.

## 2021-04-13 ENCOUNTER — Other Ambulatory Visit: Payer: Self-pay | Admitting: Student

## 2021-04-15 NOTE — Telephone Encounter (Signed)
This is a [place] pt.  °

## 2021-04-17 ENCOUNTER — Other Ambulatory Visit: Payer: Self-pay | Admitting: Family Medicine

## 2021-04-18 ENCOUNTER — Telehealth: Payer: Self-pay

## 2021-04-18 ENCOUNTER — Other Ambulatory Visit: Payer: Self-pay | Admitting: *Deleted

## 2021-04-18 DIAGNOSIS — E119 Type 2 diabetes mellitus without complications: Secondary | ICD-10-CM

## 2021-04-18 MED ORDER — FUROSEMIDE 40 MG PO TABS: ORAL_TABLET | ORAL | 0 refills | Status: AC

## 2021-04-18 MED ORDER — LANCETS MISC: 11 refills | Status: AC

## 2021-04-18 NOTE — Telephone Encounter (Signed)
Medication sent to pharmacy  

## 2021-04-18 NOTE — Telephone Encounter (Signed)
Patient brother Cindy [name] need med refills:  furosemide (LASIX) 40 MG tablet   Fluconazole 150 mg  Pharmacy: [facility]

## 2021-04-18 NOTE — Telephone Encounter (Signed)
Cindy [name] walkin patient need med refill  Lancets MISC  Pharmacy: [facility]

## 2021-04-18 NOTE — Telephone Encounter (Signed)
Lancets sent to pt pharmacy

## 2021-04-22 ENCOUNTER — Encounter: Payer: Self-pay | Admitting: Nurse Practitioner

## 2021-04-22 ENCOUNTER — Ambulatory Visit (INDEPENDENT_AMBULATORY_CARE_PROVIDER_SITE_OTHER): Payer: Medicare Other | Admitting: Nurse Practitioner

## 2021-04-22 ENCOUNTER — Other Ambulatory Visit: Payer: Self-pay | Admitting: Nurse Practitioner

## 2021-04-22 ENCOUNTER — Other Ambulatory Visit: Payer: Self-pay

## 2021-04-22 DIAGNOSIS — R21 Rash and other nonspecific skin eruption: Secondary | ICD-10-CM

## 2021-04-22 DIAGNOSIS — I10 Essential (primary) hypertension: Secondary | ICD-10-CM | POA: Diagnosis not present

## 2021-04-22 MED ORDER — HYDROXYZINE PAMOATE 50 MG PO CAPS: 50.0000 mg | ORAL_CAPSULE | Freq: Three times a day (TID) | ORAL | 0 refills | Status: DC | PRN

## 2021-04-22 MED ORDER — OLMESARTAN MEDOXOMIL 20 MG PO TABS: 10.0000 mg | ORAL_TABLET | Freq: Every day | ORAL | 1 refills | Status: DC

## 2021-04-22 NOTE — Progress Notes (Signed)
Acute Office Visit  Subjective:    Patient ID: Cindy [name], female    DOB: Mar 06, 1956, 65 y.o.   MRN: [id number]  Chief Complaint  Patient presents with  . Rash    To face, hands, nose; started over the weekend.    HPI Patient is in today for rash. She was started on losartan on 04/09/21 for HTN.  Today, she states that she has been experiencing a rash.  Past Medical History:  Diagnosis Date  . 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 Arthritis   . CAD (coronary artery disease)    a. NSTEMI 07/2018 s/p difficult PCI with overlapping DES to LAD, residual OM2 disease treated medically.  . Chronic diastolic CHF (congestive heart failure) ([place])   . Chronic pain   . Chronic respiratory failure ([place]) On home 02 2-3L  . Compression fracture of lumbar vertebra ([place])   . COPD (chronic obstructive pulmonary disease) ([place])   . Essential hypertension   . Hyperglycemia, drug-induced   . Hyperlipidemia   . Kidney stones 2014   Left side, multiple  . Leukocytosis   . Myocardial infarct ([place])   . Osteoporosis 2013  . Thyroid disease    pt denies  . Tobacco abuse     Past Surgical History:  Procedure Laterality Date  . CERVICAL BIOPSY    . COLONOSCOPY N/A 03/31/2013   Procedure: COLONOSCOPY;  Surgeon: Rogene Houston, MD;  Location: [facility];  Service: Endoscopy;  Laterality: N/A;  730  . CORONARY ATHERECTOMY N/A 08/19/2018   Procedure: CORONARY ATHERECTOMY;  Surgeon: Nelva Bush, MD;  Location: [facility];  Service: Cardiovascular;  Laterality: N/A;  . CORONARY STENT INTERVENTION N/A 08/19/2018   Procedure: CORONARY STENT INTERVENTION;  Surgeon: Nelva Bush, MD;  Location: [facility];  Service: Cardiovascular;  Laterality: N/A;  . INTRAVASCULAR ULTRASOUND/IVUS N/A 08/19/2018   Procedure: Intravascular Ultrasound/IVUS;  Surgeon: Nelva Bush, MD;  Location: [facility];  Service: Cardiovascular;  Laterality: N/A;  . LEFT HEART CATH N/A 08/19/2018    Procedure: Left Heart Cath;  Surgeon: Nelva Bush, MD;  Location: [facility];  Service: Cardiovascular;  Laterality: N/A;  . LEFT HEART CATH AND CORONARY ANGIOGRAPHY N/A 08/16/2018   Procedure: LEFT HEART CATH AND CORONARY ANGIOGRAPHY;  Surgeon: Nelva Bush, MD;  Location: [facility];  Service: Cardiovascular;  Laterality: N/A;  . TUBAL LIGATION      Family History  Problem Relation Age of Onset  . Heart disease Mother   . Hypertension Sister   . Hypertension Brother     Social History   Socioeconomic History  . Marital status: Divorced    Spouse name: Not on file  . Number of children: 3  . Years of education: Not on file  . Highest education level: Not on file  Occupational History  . Occupation: disabled    Employer: DISABLED  Tobacco Use  . Smoking status: Current Some Day Smoker    Packs/day: 1.00    Years: 25.00    Pack years: 25.00    Types: Cigarettes  . Smokeless tobacco: Never Used  . Tobacco comment: 2-5 cigarettes smoked daily 04/02/21 [facility]   Vaping Use  . Vaping Use: Never used  Substance and Sexual Activity  . Alcohol use: No  . Drug use: No  . Sexual activity: Not Currently    Birth control/protection: Surgical  Other Topics Concern  . Not on file  Social History Narrative  .  Not on file   Social Determinants of Health   Financial Resource Strain: Not on file  Food Insecurity: Not on file  Transportation Needs: Not on file  Physical Activity: Not on file  Stress: Not on file  Social Connections: Not on file  Intimate Partner Violence: Not on file    Outpatient Medications Prior to Visit  Medication Sig Dispense Refill  . albuterol (PROVENTIL) (2.5 MG/3ML) 0.083% nebulizer solution Take 3 mLs (2.5 mg total) by nebulization every 4 (four) hours as needed for wheezing or shortness of breath. Dx: J44.9- COPD. 525 mL 0  . albuterol (VENTOLIN HFA) 108 (90 Base) MCG/ACT inhaler Inhale 2 puffs into the lungs every 4 (four) hours  as needed for wheezing or shortness of breath. 1 each 1  . alendronate (FOSAMAX) 70 MG tablet TAKE 1 TAB BY MOUTH EVERY WEEK ON AN EMPTY STOMACH. 12 tablet 0  . atorvastatin (LIPITOR) 80 MG tablet TAKE 1 TABLET BY MOUTH DAILY 90 tablet 0  . Blood Glucose Monitoring Suppl (BLOOD GLUCOSE SYSTEM PAK) KIT Please dispense based on patient and insurance preference. Use as directed to monitor FSBS 1x daily. Dx: E11.9. 1 kit 1  . Calcium Carb-Cholecalciferol 600-800 MG-UNIT TABS Take 1 tablet by mouth 2 (two) times daily.     . carbamide peroxide ([name]) 6.5 % OTIC solution Place 5 drops into both ears 2 (two) times daily. 15 mL 0  . clopidogrel (PLAVIX) 75 MG tablet TAKE ONE TABLET BY MOUTH ONCE DAILY. 90 tablet 0  . clotrimazole-betamethasone (LOTRISONE) cream APPLY TO AFFECTED AREA TWICE DAILY. 45 g 0  . doxycycline (VIBRA-TABS) 100 MG tablet Take 1 tablet (100 mg total) by mouth 2 (two) times daily. 20 tablet 0  . ezetimibe (ZETIA) 10 MG tablet TAKE 1 TABLET(10 MG) BY MOUTH DAILY 90 tablet 2  . furosemide (LASIX) 40 MG tablet ALTERNATE TAKING 2 TABLETS ONE DAY THEN 1 TABLET NEXT DAY AND REPEAT 142 tablet 0  . glipiZIDE (GLUCOTROL) 10 MG tablet Take 1 tablet (10 mg total) by mouth 2 (two) times daily before a meal. 60 tablet 3  . Glucose Blood (BLOOD GLUCOSE TEST STRIPS) STRP Please dispense based on patient and insurance preference. Use as directed to monitor FSBS 1x daily. Dx: E11.9. 100 strip 1  . guaiFENesin-codeine 100-10 MG/5ML syrup Take 5 mLs by mouth every 6 (six) hours as needed. 180 mL 0  . ipratropium (ATROVENT) 0.03 % nasal spray Place 2 sprays into both nostrils every 12 (twelve) hours. 30 mL 12  . Lancets MISC Please dispense based on patient and insurance preference. Use as directed to monitor FSBS 1x daily. Dx: E11.9. 100 each 11  . LINZESS 145 MCG CAPS capsule TAKE 1 CAPSULE BY MOUTH BEFORE BREAKFAST. 30 capsule 0  . metoprolol tartrate (LOPRESSOR) 25 MG tablet Take 1 tablet (25 mg  total) by mouth 2 (two) times daily. 180 tablet 3  . mupirocin ointment (BACTROBAN) 2 % Apply to affected areas on buttocks and toe twice a day for 1 week 22 g 0  . nitroGLYCERIN (NITROSTAT) 0.4 MG SL tablet Place 1 tablet (0.4 mg total) under the tongue every 5 (five) minutes as needed for chest pain. If no improvement with 3 doses call 911 50 tablet 3  . nystatin (MYCOSTATIN) 100000 UNIT/ML suspension Use as directed 5 mLs (500,000 Units total) in the mouth or throat 4 (four) times daily. Swish and swallow 60 mL 0  . oxyCODONE-acetaminophen (PERCOCET) 5-325 MG tablet Take 1 tablet by  mouth every 6 (six) hours as needed for severe pain. 30 tablet 0  . OXYGEN Inhale 3 L into the lungs continuous.     . pantoprazole (PROTONIX) 40 MG tablet Take 1 tablet (40 mg total) by mouth 2 (two) times daily. 180 tablet 0  . polyethylene glycol (MIRALAX / GLYCOLAX) packet Take 17 g by mouth daily. (Patient taking differently: Take 17 g by mouth daily as needed for mild constipation or moderate constipation.) 14 each 0  . potassium chloride SA (KLOR-CON) 20 MEQ tablet Take 1 tablet (20 mEq total) by mouth daily for 3 days. 3 tablet 0  . predniSONE (DELTASONE) 10 MG tablet Take 81m once a day 30 tablet 0  . spironolactone (ALDACTONE) 25 MG tablet Take 1 tablet (25 mg total) by mouth daily. 90 tablet 3  . SYMBICORT 160-4.5 MCG/ACT inhaler INHALE 2 PUFFS INTO THE LUNGS TWICE DAILY. 30.6 g 0  . Tiotropium Bromide Monohydrate (SPIRIVA RESPIMAT) 2.5 MCG/ACT AERS Inhale 2 puffs into the lungs daily. 12 g 11  . losartan (COZAAR) 50 MG tablet Take 1 tablet (50 mg total) by mouth daily. 90 tablet 1  . predniSONE (DELTASONE) 20 MG tablet Take 632mx 3 days, then 4075m 3 days, then 9m55m3 days 18 tablet 0   No facility-administered medications prior to visit.    Allergies  Allergen Reactions  . Keflex [Cephalexin] Anaphylaxis and Rash  . Sulfa Antibiotics   . Avelox [Moxifloxacin Hcl In Nacl] Itching and Rash  .  Toradol [Ketorolac Tromethamine] Swelling, Rash and Other (See Comments)    Bruising, swelling, rash at injection site    Review of Systems  Constitutional: Negative.   HENT: Negative.   Respiratory: Negative.  Negative for cough, chest tightness, shortness of breath and wheezing.   Cardiovascular: Negative.   Skin: Positive for rash.       Itchy to legs, thighs, back Has swelling around nose and face  Psychiatric/Behavioral: Negative.        Objective:    Physical Exam  There were no vitals taken for this visit. Wt Readings from Last 3 Encounters:  03/11/21 182 lb 6.4 oz (82.7 kg)  02/18/21 181 lb (82.1 kg)  02/15/21 189 lb (85.7 kg)    Health Maintenance Due  Topic Date Due  . OPHTHALMOLOGY EXAM  Never done  . PAP SMEAR-Modifier  09/24/2018  . MAMMOGRAM  04/23/2019  . COVID-19 Vaccine (3 - Moderna risk 4-dose series) 05/04/2020  . FOOT EXAM  09/26/2020  . PNA vac Low Risk Adult (1 of 2 - PCV13) 04/05/2021    There are no preventive care reminders to display for this patient.   Lab Results  Component Value Date   TSH 2.323 08/27/2018   Lab Results  Component Value Date   WBC 17.4 (H) 02/18/2021   HGB 13.4 02/18/2021   HCT 39.9 02/18/2021   MCV 92.6 02/18/2021   PLT 313 02/18/2021   Lab Results  Component Value Date   NA 138 02/18/2021   K 3.8 02/18/2021   CO2 43 (H) 02/18/2021   GLUCOSE 221 (H) 02/18/2021   BUN 14 02/18/2021   CREATININE 1.03 (H) 02/18/2021   BILITOT 0.4 02/18/2021   ALKPHOS 69 10/20/2020   AST 27 02/18/2021   ALT 33 (H) 02/18/2021   PROT 6.6 02/18/2021   ALBUMIN 4.1 10/20/2020   CALCIUM 9.1 02/18/2021   ANIONGAP 13 10/20/2020   Lab Results  Component Value Date   CHOL 153 08/24/2020  Lab Results  Component Value Date   HDL 46 (L) 08/24/2020   Lab Results  Component Value Date   LDLCALC 76 08/24/2020   Lab Results  Component Value Date   TRIG 215 (H) 08/24/2020   Lab Results  Component Value Date   CHOLHDL 3.3  08/24/2020   Lab Results  Component Value Date   HGBA1C 8.3 (H) 02/18/2021       Assessment & Plan:   Problem List Items Addressed This Visit      Cardiovascular and Mediastinum   Primary hypertension    -STOP losartan d/t rash -Rx. olmesartan -if rash persists after changing meds, will consider stopping ARB -cardiology previously wanted her on ARB, so will try to stay in same class if tolerable      Relevant Medications   olmesartan (BENICAR) 20 MG tablet     Musculoskeletal and Integument   Rash and nonspecific skin eruption    -Rx. Hydroxyzine -we discussed a prednisone taper, but she states that she just finished a taper and does not want this          Meds ordered this encounter  Medications  . DISCONTD: hydrOXYzine (VISTARIL) 50 MG capsule    Sig: Take 1 capsule (50 mg total) by mouth 3 (three) times daily as needed.    Dispense:  30 capsule    Refill:  0  . DISCONTD: olmesartan (BENICAR) 20 MG tablet    Sig: Take 0.5 tablets (10 mg total) by mouth daily.    Dispense:  15 tablet    Refill:  1  . olmesartan (BENICAR) 20 MG tablet    Sig: Take 0.5 tablets (10 mg total) by mouth daily.    Dispense:  15 tablet    Refill:  1  . hydrOXYzine (VISTARIL) 50 MG capsule    Sig: Take 1 capsule (50 mg total) by mouth 3 (three) times daily as needed.    Dispense:  30 capsule    Refill:  0   Date:  04/22/2021   Location of Patient: Home Location of Provider: Office Consent was obtain for visit to be over via telehealth. I verified that I am speaking with the correct person using two identifiers.  I connected with  [name] on 04/22/21 via telephone and verified that I am speaking with the correct person using two identifiers.   I discussed the limitations of evaluation and management by telemedicine. The patient expressed understanding and agreed to proceed.  Time spent: 9 min   Noreene Larsson, NP

## 2021-04-22 NOTE — Assessment & Plan Note (Signed)
-  STOP losartan d/t rash -Rx. olmesartan -if rash persists after changing meds, will consider stopping ARB -cardiology previously wanted her on ARB, so will try to stay in same class if tolerable

## 2021-04-22 NOTE — Assessment & Plan Note (Signed)
-  Rx. Hydroxyzine -we discussed a prednisone taper, but she states that she just finished a taper and does not want this

## 2021-05-03 ENCOUNTER — Ambulatory Visit: Payer: Medicare Other | Admitting: Nurse Practitioner

## 2021-05-06 ENCOUNTER — Other Ambulatory Visit: Payer: Self-pay | Admitting: Family Medicine

## 2021-05-06 ENCOUNTER — Telehealth: Payer: Self-pay

## 2021-05-06 ENCOUNTER — Other Ambulatory Visit: Payer: Self-pay | Admitting: *Deleted

## 2021-05-06 DIAGNOSIS — J9611 Chronic respiratory failure with hypoxia: Secondary | ICD-10-CM

## 2021-05-06 DIAGNOSIS — J441 Chronic obstructive pulmonary disease with (acute) exacerbation: Secondary | ICD-10-CM

## 2021-05-06 MED ORDER — CLOPIDOGREL BISULFATE 75 MG PO TABS: 1.0000 | ORAL_TABLET | Freq: Every day | ORAL | 0 refills | Status: AC

## 2021-05-06 MED ORDER — BUDESONIDE-FORMOTEROL FUMARATE 160-4.5 MCG/ACT IN AERO: 2.0000 | INHALATION_SPRAY | Freq: Two times a day (BID) | RESPIRATORY_TRACT | 0 refills | Status: AC

## 2021-05-06 MED ORDER — HYDROXYZINE PAMOATE 50 MG PO CAPS: 50.0000 mg | ORAL_CAPSULE | Freq: Three times a day (TID) | ORAL | 0 refills | Status: DC | PRN

## 2021-05-06 MED ORDER — ALBUTEROL SULFATE (2.5 MG/3ML) 0.083% IN NEBU: 2.5000 mg | INHALATION_SOLUTION | RESPIRATORY_TRACT | 0 refills | Status: DC | PRN

## 2021-05-06 NOTE — Telephone Encounter (Signed)
Patient brother walkin patient needs med refill SYMBICORT 160-4.5 MCG/ACT inhaler  clopidogrel (PLAVIX) 75 MG tablet albuterol (PROVENTIL) (2.5 MG/3ML) 0.083% nebulizer solution   Patient asking for more extra refills is possible.   Pharmacy: [facility]

## 2021-05-06 NOTE — Telephone Encounter (Signed)
At this time she will need to keep calling the pharmacy when refills are due.   Hydroxyzine sent to pharmacy

## 2021-05-06 NOTE — Telephone Encounter (Signed)
Pt is asking for refills on the prescriptions so she doesn't have to call every month    She is asking for 90 days.  She also needs hydroxizine 50 mg

## 2021-05-06 NOTE — Telephone Encounter (Signed)
Pt medication sent to pharmacy  

## 2021-05-07 ENCOUNTER — Other Ambulatory Visit: Payer: Self-pay | Admitting: Family Medicine

## 2021-05-07 DIAGNOSIS — J9611 Chronic respiratory failure with hypoxia: Secondary | ICD-10-CM

## 2021-05-07 DIAGNOSIS — J441 Chronic obstructive pulmonary disease with (acute) exacerbation: Secondary | ICD-10-CM

## 2021-05-12 ENCOUNTER — Emergency Department (HOSPITAL_COMMUNITY): Payer: Medicare Other

## 2021-05-12 ENCOUNTER — Emergency Department (HOSPITAL_COMMUNITY)
Admission: EM | Admit: 2021-05-12 | Discharge: 2021-05-12 | Disposition: A | Discharge status: Home / Self Care | Payer: Medicare Other | Attending: Emergency Medicine | Admitting: Emergency Medicine

## 2021-05-12 ENCOUNTER — Other Ambulatory Visit: Payer: Self-pay

## 2021-05-12 DIAGNOSIS — I5032 Chronic diastolic (congestive) heart failure: Secondary | ICD-10-CM | POA: Insufficient documentation

## 2021-05-12 DIAGNOSIS — R0602 Shortness of breath: Secondary | ICD-10-CM | POA: Insufficient documentation

## 2021-05-12 DIAGNOSIS — Z7951 Long term (current) use of inhaled steroids: Secondary | ICD-10-CM | POA: Insufficient documentation

## 2021-05-12 DIAGNOSIS — F17210 Nicotine dependence, cigarettes, uncomplicated: Secondary | ICD-10-CM | POA: Insufficient documentation

## 2021-05-12 DIAGNOSIS — Z955 Presence of coronary angioplasty implant and graft: Secondary | ICD-10-CM | POA: Diagnosis not present

## 2021-05-12 DIAGNOSIS — Z7984 Long term (current) use of oral hypoglycemic drugs: Secondary | ICD-10-CM | POA: Insufficient documentation

## 2021-05-12 DIAGNOSIS — Z79899 Other long term (current) drug therapy: Secondary | ICD-10-CM | POA: Insufficient documentation

## 2021-05-12 DIAGNOSIS — I2510 Atherosclerotic heart disease of native coronary artery without angina pectoris: Secondary | ICD-10-CM | POA: Diagnosis not present

## 2021-05-12 DIAGNOSIS — R220 Localized swelling, mass and lump, head: Secondary | ICD-10-CM | POA: Diagnosis not present

## 2021-05-12 DIAGNOSIS — E119 Type 2 diabetes mellitus without complications: Secondary | ICD-10-CM | POA: Diagnosis not present

## 2021-05-12 DIAGNOSIS — I110 Hypertensive heart disease with heart failure: Secondary | ICD-10-CM | POA: Insufficient documentation

## 2021-05-12 DIAGNOSIS — J449 Chronic obstructive pulmonary disease, unspecified: Secondary | ICD-10-CM | POA: Diagnosis not present

## 2021-05-12 DIAGNOSIS — R600 Localized edema: Secondary | ICD-10-CM

## 2021-05-12 DIAGNOSIS — Z20822 Contact with and (suspected) exposure to covid-19: Secondary | ICD-10-CM | POA: Insufficient documentation

## 2021-05-12 DIAGNOSIS — M7989 Other specified soft tissue disorders: Secondary | ICD-10-CM | POA: Insufficient documentation

## 2021-05-12 DIAGNOSIS — R062 Wheezing: Secondary | ICD-10-CM | POA: Insufficient documentation

## 2021-05-12 LAB — CBC WITH DIFFERENTIAL/PLATELET
Abs Immature Granulocytes: 0.3 10*3/uL — ABNORMAL HIGH (ref 0.00–0.07)
Basophils Absolute: 0.1 10*3/uL (ref 0.0–0.1)
Basophils Relative: 1 %
Eosinophils Absolute: 0 10*3/uL (ref 0.0–0.5)
Eosinophils Relative: 0 %
HCT: 35.7 % — ABNORMAL LOW (ref 36.0–46.0)
Hemoglobin: 11.3 g/dL — ABNORMAL LOW (ref 12.0–15.0)
Immature Granulocytes: 2 %
Lymphocytes Relative: 14 %
Lymphs Abs: 2.7 10*3/uL (ref 0.7–4.0)
MCH: 30.8 pg (ref 26.0–34.0)
MCHC: 31.7 g/dL (ref 30.0–36.0)
MCV: 97.3 fL (ref 80.0–100.0)
Monocytes Absolute: 1.5 10*3/uL — ABNORMAL HIGH (ref 0.1–1.0)
Monocytes Relative: 8 %
Neutro Abs: 14.5 10*3/uL — ABNORMAL HIGH (ref 1.7–7.7)
Neutrophils Relative %: 75 %
Platelets: 276 10*3/uL (ref 150–400)
RBC: 3.67 MIL/uL — ABNORMAL LOW (ref 3.87–5.11)
RDW: 15.2 % (ref 11.5–15.5)
WBC: 19.1 10*3/uL — ABNORMAL HIGH (ref 4.0–10.5)
nRBC: 0 % (ref 0.0–0.2)

## 2021-05-12 LAB — COMPREHENSIVE METABOLIC PANEL
ALT: 34 U/L (ref 0–44)
AST: 26 U/L (ref 15–41)
Albumin: 3.8 g/dL (ref 3.5–5.0)
Alkaline Phosphatase: 56 U/L (ref 38–126)
Anion gap: 11 (ref 5–15)
BUN: 21 mg/dL (ref 8–23)
CO2: 34 mmol/L — ABNORMAL HIGH (ref 22–32)
Calcium: 9.1 mg/dL (ref 8.9–10.3)
Chloride: 92 mmol/L — ABNORMAL LOW (ref 98–111)
Creatinine, Ser: 1.29 mg/dL — ABNORMAL HIGH (ref 0.44–1.00)
GFR, Estimated: 46 mL/min — ABNORMAL LOW (ref >60)
Glucose, Bld: 254 mg/dL — ABNORMAL HIGH (ref 70–99)
Potassium: 4.6 mmol/L (ref 3.5–5.1)
Sodium: 137 mmol/L (ref 135–145)
Total Bilirubin: 0.6 mg/dL (ref 0.3–1.2)
Total Protein: 6.5 g/dL (ref 6.5–8.1)

## 2021-05-12 LAB — POC SARS CORONAVIRUS 2 AG -  ED: SARSCOV2ONAVIRUS 2 AG: NEGATIVE

## 2021-05-12 LAB — BRAIN NATRIURETIC PEPTIDE: B Natriuretic Peptide: 13 pg/mL (ref 0.0–100.0)

## 2021-05-12 LAB — MAGNESIUM: Magnesium: 1.7 mg/dL (ref 1.7–2.4)

## 2021-05-12 LAB — TROPONIN I (HIGH SENSITIVITY)
Troponin I (High Sensitivity): 5 ng/L (ref <18)
Troponin I (High Sensitivity): 6 ng/L (ref <18)

## 2021-05-12 MED ORDER — OXYCODONE-ACETAMINOPHEN 5-325 MG PO TABS
1.0000 | ORAL_TABLET | Freq: Once | ORAL | Status: AC
Start: 2021-05-12 — End: 2021-05-12
  Administered 2021-05-12: 1 via ORAL
  Filled 2021-05-12: qty 1

## 2021-05-12 MED ORDER — IPRATROPIUM-ALBUTEROL 0.5-2.5 (3) MG/3ML IN SOLN
3.0000 mL | Freq: Three times a day (TID) | RESPIRATORY_TRACT | Status: DC
  Administered 2021-05-12: 3 mL via RESPIRATORY_TRACT
  Filled 2021-05-12: qty 3

## 2021-05-12 MED ORDER — METHYLPREDNISOLONE SODIUM SUCC 125 MG IJ SOLR
125.0000 mg | Freq: Once | INTRAMUSCULAR | Status: AC
  Administered 2021-05-12: 125 mg via INTRAVENOUS
  Filled 2021-05-12: qty 2

## 2021-05-12 MED ORDER — ALBUTEROL SULFATE HFA 108 (90 BASE) MCG/ACT IN AERS
4.0000 | INHALATION_SPRAY | Freq: Once | RESPIRATORY_TRACT | Status: AC
  Administered 2021-05-12: 4 via RESPIRATORY_TRACT
  Filled 2021-05-12: qty 6.7

## 2021-05-12 MED ORDER — MORPHINE SULFATE (PF) 4 MG/ML IV SOLN
4.0000 mg | Freq: Once | INTRAVENOUS | Status: AC
  Administered 2021-05-12: 4 mg via INTRAVENOUS
  Filled 2021-05-12: qty 1

## 2021-05-12 MED ORDER — PREDNISONE 10 MG (21) PO TBPK: ORAL_TABLET | Freq: Every day | ORAL | 0 refills | Status: DC

## 2021-05-12 MED ORDER — IPRATROPIUM BROMIDE HFA 17 MCG/ACT IN AERS
2.0000 | INHALATION_SPRAY | Freq: Once | RESPIRATORY_TRACT | Status: AC
  Administered 2021-05-12: 2 via RESPIRATORY_TRACT
  Filled 2021-05-12: qty 12.9

## 2021-05-12 MED ORDER — DOXYCYCLINE HYCLATE 100 MG PO CAPS: 100.0000 mg | ORAL_CAPSULE | Freq: Two times a day (BID) | ORAL | 0 refills | Status: AC

## 2021-05-12 NOTE — ED Provider Notes (Signed)
[facility] EMERGENCY DEPARTMENT Provider Note   CSN: [id number] Arrival date & time: 05/12/21  0854     History Chief Complaint  Patient presents with  . Shortness of Breath  . Leg Swelling    Cindy [name] is a 65 y.o. female.  HPI   Patient with significant medical history of COPD, chronic respiratory failure on 3 L via nasal cannula, CAD with stents, CHF with preserved ejection fraction, type 2 diabetes, presents to the emergency department with multitude of complaints.  Patient states she is here because she is having shortness of breath, states has been getting worse over the last month, she endorsed that patient was started on a Sartan in per her cardiologist and since then she had  subjective swelling in her arms legs and face.  Patient states the swelling in her face has localized into her nostrils and it makes it difficult for her to breathe through her nose.  Patient was seen at her primary care provider on 04/27 where they stopped her blood pressure medication and placed her on a H1 blocker.  Patient states she still has issues despite these attempts to address it.  She denies chest pain, worsening orthopnea, worsening pedal edema, denies recent sick contacts, denies fevers or chills, denies productive cough.  Patient denies any alleviating factors.  Patient denies headaches, fevers, chills, chest pain, abdominal pain, nausea, vomiting, diarrhea.  Past Medical History:  Diagnosis Date  . 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 Arthritis   . CAD (coronary artery disease)    a. NSTEMI 07/2018 s/p difficult PCI with overlapping DES to LAD, residual OM2 disease treated medically.  . Chronic diastolic CHF (congestive heart failure) ([place])   . Chronic pain   . Chronic respiratory failure ([place]) On home 02 2-3L  . Compression fracture of lumbar vertebra ([place])   . COPD (chronic obstructive pulmonary disease) ([place])   . Essential hypertension   . Hyperglycemia, drug-induced   .  Hyperlipidemia   . Kidney stones 2014   Left side, multiple  . Leukocytosis   . Myocardial infarct ([place])   . Osteoporosis 2013  . Thyroid disease    pt denies  . Tobacco abuse     Patient Active Problem List   Diagnosis Date Noted  . Secondary hyperparathyroidism ([place]) 01/25/2021  . Stucco keratoses 09/27/2019  . Diabetes mellitus without complication ([place]) 09/32/3557  . Chronic pain 11/18/2018  . CAD (coronary artery disease) 10/05/2018  . Chronic diastolic CHF (congestive heart failure) ([place])   . S/P PTCA (percutaneous transluminal coronary angioplasty)   . Orthopnea   . H/O non-ST elevation myocardial infarction (NSTEMI)   . Primary hypertension   . Abnormal EKG 08/11/2018  . Diastolic dysfunction 32/20/2542  . Pulmonary hypertension ([place]) 05/01/2016  . Rash and nonspecific skin eruption 08/15/2014  . Ureteral stone with hydronephrosis 05/30/2013  . Allergic rhinitis 04/11/2013  . Lumbar back pain 03/03/2013  . Compression fracture of L3 lumbar vertebra 03/03/2013  . Constipation 03/03/2013  . Osteoporosis 03/16/2012  . GERD (gastroesophageal reflux disease) 03/16/2012  . Thrush 03/02/2012  . Chronic hypoxemic respiratory failure ([place]) 02/27/2012  . Hyperlipidemia 02/25/2012  . Tobacco abuse 09/24/2011  . COPD with acute exacerbation ([place]) 12/24/2009    Past Surgical History:  Procedure Laterality Date  . CERVICAL BIOPSY    . COLONOSCOPY N/A 03/31/2013   Procedure: COLONOSCOPY;  Surgeon: Rogene Houston, MD;  Location[facility];  Service: Endoscopy;  Laterality: N/A;  730  .  CORONARY ATHERECTOMY N/A 08/19/2018   Procedure: CORONARY ATHERECTOMY;  Surgeon: Nelva Bush, MD;  Location: [place] [facility] LAB;  Service: Cardiovascular;  Laterality: N/A;  . CORONARY STENT INTERVENTION N/A 08/19/2018   Procedure: CORONARY STENT INTERVENTION;  Surgeon: Nelva Bush, MD;  Location: [place] [facility] LAB;  Service: Cardiovascular;  Laterality: N/A;  . INTRAVASCULAR  ULTRASOUND/IVUS N/A 08/19/2018   Procedure: Intravascular Ultrasound/IVUS;  Surgeon: Nelva Bush, MD;  Location: [place] [facility];  Service: Cardiovascular;  Laterality: N/A;  . LEFT HEART CATH N/A 08/19/2018   Procedure: Left Heart Cath;  Surgeon: Nelva Bush, MD;  Location: [place] [facility];  Service: Cardiovascular;  Laterality: N/A;  . LEFT HEART CATH AND CORONARY ANGIOGRAPHY N/A 08/16/2018   Procedure: LEFT HEART CATH AND CORONARY ANGIOGRAPHY;  Surgeon: Nelva Bush, MD;  Location: [place] [facility] LAB;  Service: Cardiovascular;  Laterality: N/A;  . TUBAL LIGATION       OB History   No obstetric history on file.     Family History  Problem Relation Age of Onset  . Heart disease Mother   . Hypertension Sister   . Hypertension Brother     Social History   Tobacco Use  . Smoking status: Current Some Day Smoker    Packs/day: 1.00    Years: 25.00    Pack years: 25.00    Types: Cigarettes  . Smokeless tobacco: Never Used  . Tobacco comment: 2-5 cigarettes smoked daily 04/02/21 [facility]   Vaping Use  . Vaping Use: Never used  Substance Use Topics  . Alcohol use: No  . Drug use: No    Home Medications Prior to Admission medications   Medication Sig Start Date End Date Taking? Authorizing Provider  doxycycline (VIBRAMYCIN) 100 MG capsule Take 1 capsule (100 mg total) by mouth 2 (two) times daily for 7 days. 05/12/21 05/19/21 Yes Marcello Fennel, PA-C  albuterol (PROVENTIL) (2.5 MG/3ML) 0.083% nebulizer solution Take 3 mLs (2.5 mg total) by nebulization every 4 (four) hours as needed for wheezing or shortness of breath. Dx: J44.9- COPD. 05/06/21   Lindell Spar, MD  albuterol (VENTOLIN HFA) 108 (90 Base) MCG/ACT inhaler Inhale 2 puffs into the lungs every 4 (four) hours as needed for wheezing or shortness of breath. 01/28/21   Alycia Rossetti, MD  alendronate (FOSAMAX) 70 MG tablet TAKE 1 TAB BY MOUTH EVERY WEEK ON AN EMPTY STOMACH. 02/22/21   Alycia Rossetti, MD   atorvastatin (LIPITOR) 80 MG tablet TAKE 1 TABLET BY MOUTH DAILY 01/15/21   Ahmed Prima, [place] M, PA-C  Blood Glucose Monitoring Suppl (BLOOD GLUCOSE SYSTEM PAK) KIT Please dispense based on patient and insurance preference. Use as directed to monitor FSBS 1x daily. Dx: E11.9. 09/29/19   Alycia Rossetti, MD  budesonide-formoterol [facility]) 160-4.5 MCG/ACT inhaler Inhale 2 puffs into the lungs 2 (two) times daily. 05/06/21   Lindell Spar, MD  Calcium Carb-Cholecalciferol 600-800 MG-UNIT TABS Take 1 tablet by mouth 2 (two) times daily.     [provider]  carbamide peroxide ([name]) 6.5 % OTIC solution Place 5 drops into both ears 2 (two) times daily. 03/11/21   Lindell Spar, MD  clopidogrel (PLAVIX) 75 MG tablet Take 1 tablet (75 mg total) by mouth daily. 05/06/21   Lindell Spar, MD  clotrimazole-betamethasone (LOTRISONE) cream APPLY TO AFFECTED AREA TWICE DAILY. 11/14/19   Alycia Rossetti, MD  ezetimibe (ZETIA) 10 MG tablet TAKE 1 TABLET(10 MG) BY MOUTH DAILY 04/15/21  Ahmed Prima, [place] M, PA-C  furosemide (LASIX) 40 MG tablet ALTERNATE TAKING 2 TABLETS ONE DAY THEN 1 TABLET NEXT DAY AND REPEAT 04/18/21   Lindell Spar, MD  glipiZIDE (GLUCOTROL) 10 MG tablet Take 1 tablet (10 mg total) by mouth 2 (two) times daily before a meal. 02/27/21   [place], Modena Nunnery, MD  Glucose Blood (BLOOD GLUCOSE TEST STRIPS) STRP Please dispense based on patient and insurance preference. Use as directed to monitor FSBS 1x daily. Dx: E11.9. 09/29/19   Alycia Rossetti, MD  guaiFENesin-codeine 100-10 MG/5ML syrup Take 5 mLs by mouth every 6 (six) hours as needed. 03/28/21   Lindell Spar, MD  hydrOXYzine (VISTARIL) 50 MG capsule Take 1 capsule (50 mg total) by mouth 3 (three) times daily as needed. 05/06/21   Lindell Spar, MD  ipratropium (ATROVENT) 0.03 % nasal spray Place 2 sprays into both nostrils every 12 (twelve) hours. 04/04/21   Lindell Spar, MD  Lancets MISC Please dispense based on patient and  insurance preference. Use as directed to monitor FSBS 1x daily. Dx: E11.9. 04/18/21   Lindell Spar, MD  LINZESS 145 MCG CAPS capsule TAKE 1 CAPSULE BY MOUTH BEFORE BREAKFAST. 02/13/21   Alycia Rossetti, MD  metoprolol tartrate (LOPRESSOR) 25 MG tablet Take 1 tablet (25 mg total) by mouth 2 (two) times daily. 02/13/21   Alycia Rossetti, MD  mupirocin ointment [facility]) 2 % Apply to affected areas on buttocks and toe twice a day for 1 week 02/18/21   Alycia Rossetti, MD  nitroGLYCERIN (NITROSTAT) 0.4 MG SL tablet Place 1 tablet (0.4 mg total) under the tongue every 5 (five) minutes as needed for chest pain. If no improvement with 3 doses call 911 08/27/18   Delsa Grana, PA-C  nystatin (MYCOSTATIN) 100000 UNIT/ML suspension Use as directed 5 mLs (500,000 Units total) in the mouth or throat 4 (four) times daily. Swish and swallow 03/11/21   Lindell Spar, MD  olmesartan (BENICAR) 20 MG tablet TAKE 1/2 TABLET(10 MG) BY MOUTH DAILY 04/22/21   Noreene Larsson, NP  oxyCODONE-acetaminophen (PERCOCET) 5-325 MG tablet Take 1 tablet by mouth every 6 (six) hours as needed for severe pain. 02/18/21   Alycia Rossetti, MD  OXYGEN Inhale 3 L into the lungs continuous.     [provider]  pantoprazole (PROTONIX) 40 MG tablet Take 1 tablet (40 mg total) by mouth 2 (two) times daily. 03/06/21   Alycia Rossetti, MD  polyethylene glycol [facility] / Floria Raveling) packet Take 17 g by mouth daily. Patient taking differently: Take 17 g by mouth daily as needed for mild constipation or moderate constipation. 06/04/18   Varney Biles, MD  potassium chloride SA (KLOR-CON) 20 MEQ tablet Take 1 tablet (20 mEq total) by mouth daily for 3 days. 08/28/20 08/31/20  Alycia Rossetti, MD  predniSONE (DELTASONE) 10 MG tablet Take 56m once a day 02/18/21   DAlycia Rossetti MD  spironolactone (ALDACTONE) 25 MG tablet Take 1 tablet (25 mg total) by mouth daily. 02/13/21   DAlycia Rossetti MD  Tiotropium Bromide Monohydrate  (SPIRIVA RESPIMAT) 2.5 MCG/ACT AERS Inhale 2 puffs into the lungs daily. 02/22/21   DAlycia Rossetti MD    Allergies    Keflex [cephalexin], Sulfa antibiotics, Avelox [moxifloxacin hcl in nacl], and Toradol [ketorolac tromethamine]  Review of Systems   Review of Systems  Constitutional: Negative for chills and fever.  HENT: Negative for congestion and sore throat.  Respiratory: Positive for shortness of breath. Negative for cough.   Cardiovascular: Negative for chest pain.  Gastrointestinal: Negative for abdominal pain, diarrhea, nausea and vomiting.  Genitourinary: Negative for dysuria and enuresis.  Musculoskeletal: Negative for back pain.  Skin: Negative for rash.  Neurological: Negative for headaches.  Hematological: Does not bruise/bleed easily.    Physical Exam Updated Vital Signs BP 119/60 (BP Location: Right Wrist)   Pulse 93   Temp 98.5 F (36.9 C) (Oral)   Resp 14   Ht 5' 2"  (1.575 m)   Wt 85 kg   SpO2 100%   BMI 34.27 kg/m   Physical Exam Vitals and nursing note reviewed.  Constitutional:      General: She is not in acute distress.    Appearance: She is obese. She is not ill-appearing.     Comments: Patient is obese, deconditioned state, chronically on 3 L via nasal cannula  HENT:     Head: Normocephalic and atraumatic.     Comments: No noted facial swelling on my exam    Nose: No congestion.     Mouth/Throat:     Mouth: Mucous membranes are moist.     Pharynx: Oropharynx is clear.  Eyes:     Conjunctiva/sclera: Conjunctivae normal.  Cardiovascular:     Rate and Rhythm: Normal rate and regular rhythm.     Pulses: Normal pulses.     Heart sounds: No murmur heard. No friction rub. No gallop.   Pulmonary:     Effort: No respiratory distress.     Breath sounds: Wheezing present. No rhonchi or rales.     Comments: Patient is noted tight sounding chest, intermittent wheezing heard in the upper lower lobes bilaterally. Chest:     Chest wall: Tenderness  present.  Abdominal:     Palpations: Abdomen is soft.     Tenderness: There is no abdominal tenderness.  Musculoskeletal:     Right lower leg: Edema present.     Left lower leg: Edema present.     Comments: Patient is noted pedal edema right leg larger than the left, neurovascular intact.  No noted skin changes  Skin:    General: Skin is warm and dry.  Neurological:     Mental Status: She is alert.  Psychiatric:        Mood and Affect: Mood normal.     ED Results / Procedures / Treatments   Labs (all labs ordered are listed, but only abnormal results are displayed) Labs Reviewed  COMPREHENSIVE METABOLIC PANEL - Abnormal; Notable for the following components:      Result Value   Chloride 92 (*)    CO2 34 (*)    Glucose, Bld 254 (*)    Creatinine, Ser 1.29 (*)    GFR, Estimated 46 (*)    All other components within normal limits  CBC WITH DIFFERENTIAL/PLATELET - Abnormal; Notable for the following components:   WBC 19.1 (*)    RBC 3.67 (*)    Hemoglobin 11.3 (*)    HCT 35.7 (*)    Neutro Abs 14.5 (*)    Monocytes Absolute 1.5 (*)    Abs Immature Granulocytes 0.30 (*)    All other components within normal limits  BRAIN NATRIURETIC PEPTIDE  MAGNESIUM  POC SARS CORONAVIRUS 2 AG -  ED  TROPONIN I (HIGH SENSITIVITY)  TROPONIN I (HIGH SENSITIVITY)    EKG EKG Interpretation  Date/Time:  Sunday May 12 2021 09:20:59 EDT Ventricular Rate:  93 PR Interval:  161 QRS Duration: 113 QT Interval:  381 QTC Calculation: 474 R Axis:   92 Text Interpretation: Sinus rhythm IRBBB and LPFB Borderline low voltage, extremity leads Confirmed by Milton Ferguson [phone]) on 05/12/2021 1:47:05 PM   Radiology DG Chest Port 1 View  Result Date: 05/12/2021 CLINICAL DATA:  Shortness of breath.  History of CHF. EXAM: PORTABLE CHEST 1 VIEW COMPARISON:  10/20/2020. FINDINGS: Normal heart size. Aortic atherosclerosis. No pleural effusion or edema. No airspace consolidation. The visualized osseous  structures are unremarkable. The visualized osseous structures are unremarkable. IMPRESSION: No active cardiopulmonary abnormalities. Electronically Signed   By: Kerby Moors M.D.   On: 05/12/2021 10:33    Procedures Procedures   Medications Ordered in ED Medications  ipratropium-albuterol (DUONEB) 0.5-2.5 (3) MG/3ML nebulizer solution 3 mL (3 mLs Nebulization Given 05/12/21 1311)  oxyCODONE-acetaminophen (PERCOCET/ROXICET) 5-325 MG per tablet 1 tablet (has no administration in time range)  albuterol (VENTOLIN HFA) 108 (90 Base) MCG/ACT inhaler 4 puff (4 puffs Inhalation Given 05/12/21 1021)  ipratropium (ATROVENT HFA) inhaler 2 puff (2 puffs Inhalation Given 05/12/21 1038)  methylPREDNISolone sodium succinate (SOLU-MEDROL) 125 mg/2 mL injection 125 mg (125 mg Intravenous Given 05/12/21 1028)  morphine 4 MG/ML injection 4 mg (4 mg Intravenous Given 05/12/21 1211)    ED Course  I have reviewed the triage vital signs and the nursing notes.  Pertinent labs & imaging results that were available during my care of the patient were reviewed by me and considered in my medical decision making (see chart for details).    MDM Rules/Calculators/A&P                         Initial impression-patient presents with shortness of breath, she is alert, does not appear in acute distress, vital signs reassuring.  Will obtain basic lab work-up, chest x-ray, EKG, provide patient with steroids and DuoNeb and reassess.  Work-up-CBC shows leukocytosis of 19.1 appears to be baseline for patient, normocytic anemia with a hemoglobin 0.3 appears to be baseline for patient, elevated CO2 of 34 at baseline for patient, creatinine 1.29 at baseline for patient, magnesium 1.7, BNP 13, COVID-negative.  Negative delta troponin, x-ray negative for acute findings.  EKG sinus without signs of ischemia  Reassessment patient was provided with albuterol inhaler, lung sounds have improved slightly but patient states she still feels  slightly short of breath, will proceed with nebulizing treatment.  Patient endorses some pain will provide her with morphine.  Patient was reassessed after nebulizing treatment as well as pain medications, lung sounds have improved still has intermittent wheezing bilaterally but does not sound as tight sounding.  Patient states she is at her baseline.  Pain has completely resolved.  Patient is agreeable for discharge.  Patient asked me to look at her ears, ears were clear bilaterally, no signs infection present.  Rule out-low suspicion for systemic infection as patient is nontoxic-appearing, vital signs reassuring.  Patient does have noted leukocytosis but this appears to be at her baseline, I suspect elevation is from chronic steroid use.  Low suspicion for ACS as patient has chest pain, EKG without signs of ischemia, negative delta troponin.  Low suspicion for CHF exacerbation as there is no fluid overloaded my exam, chest x-ray negative, BMP within normal limits. low  Suspicion for pneumonia as lung sounds were clear, x-ray negative for opacities.  Low suspicion for COPD exacerbation requiring hospital admission as there is no new oxygen requirements, lung sounds improved after nebulizing  treatment, patient states she is at her baseline.  Low suspicion for DVT as patient denies worsening leg swelling, patient does have slightly larger swelling in the right versus left but patient states this is normal for her.  Low suspicion for PE as vital signs are reassuring, exam is more consistent with COPD flareup  Plan-  1. Shortness of breath since resolved-suspect secondary due to COPD flareup, patient has steroids prescribed her from her pulmonologist, will add on short course of antibiotics have her follow-up with pulmonology for further evaluation.  Also recommend she follows up with ENT for her stuffy nose.  2.  Unilateral leg swelling-suspect acute on chronic, but cannot exclude possibility of a DVT,  will have her come back tomorrow for ultrasound.  Will defer anticoagulant this time as risks outweigh benefits.  Vital signs have remained stable, no indication for hospital admission.  Patient discussed with attending and they agreed with assessment and plan.  Patient given at home care as well strict return precautions.  Patient verbalized that they understood agreed to said plan.   Final Clinical Impression(s) / ED Diagnoses Final diagnoses:  Shortness of breath    Rx / DC Orders ED Discharge Orders         Ordered    doxycycline (VIBRAMYCIN) 100 MG capsule  2 times daily        05/12/21 1352    US Venous Img Lower Unilateral Right        05/12/21 1358           Aron Baba 05/12/21 1359    Milton Ferguson, MD 05/15/21 [phone]

## 2021-05-12 NOTE — Discharge Instructions (Addendum)
I suspect you may have a flareup of your COPD, please continue with your steroids as prescribed by your pulmonologist.  Started you on a short course of antibiotics please take as prescribed.  Please follow-up with your pulmonologist in 1 week's time for reevaluation.  I referred you to an ENT doctor for your nose please call to schedule a appointment.  I want you to come back for a DVT study, you will find the contact information above please call to schedule a appointment.  Come back to the emergency department if you develop chest pain, shortness of breath, severe abdominal pain, uncontrolled nausea, vomiting, diarrhea.

## 2021-05-12 NOTE — ED Triage Notes (Signed)
Patient has hx of CHF with 3L/[place] all the time. Patient states right ankle swelling with facial swelling over the past couple of days. Patient anxious in triage and oxygen saturation at 98%.

## 2021-05-13 ENCOUNTER — Other Ambulatory Visit: Payer: Self-pay | Admitting: Family Medicine

## 2021-05-13 ENCOUNTER — Telehealth: Payer: Self-pay

## 2021-05-13 NOTE — Telephone Encounter (Signed)
Patient calling was seen at ER she said they gave her the wrong mg of Prednisone should be 20 mg. Patient confused what meds to take and taper off of. Patient feel like taking too many pills from ER.  Patient call back # [phone]  Pharmacy: [facility]

## 2021-05-13 NOTE — Telephone Encounter (Signed)
Pt will need an ER follow up to discuss can be virtual

## 2021-05-13 NOTE — Telephone Encounter (Signed)
Appointment scheduled.

## 2021-05-15 ENCOUNTER — Telehealth (INDEPENDENT_AMBULATORY_CARE_PROVIDER_SITE_OTHER): Payer: Medicare Other | Admitting: Internal Medicine

## 2021-05-15 ENCOUNTER — Other Ambulatory Visit: Payer: Self-pay | Admitting: Family Medicine

## 2021-05-15 ENCOUNTER — Other Ambulatory Visit: Payer: Self-pay

## 2021-05-15 ENCOUNTER — Encounter: Payer: Self-pay | Admitting: Internal Medicine

## 2021-05-15 DIAGNOSIS — J309 Allergic rhinitis, unspecified: Secondary | ICD-10-CM | POA: Diagnosis not present

## 2021-05-15 DIAGNOSIS — M8080XD Other osteoporosis with current pathological fracture, unspecified site, subsequent encounter for fracture with routine healing: Secondary | ICD-10-CM

## 2021-05-15 DIAGNOSIS — J441 Chronic obstructive pulmonary disease with (acute) exacerbation: Secondary | ICD-10-CM

## 2021-05-15 DIAGNOSIS — M7989 Other specified soft tissue disorders: Secondary | ICD-10-CM | POA: Diagnosis not present

## 2021-05-15 MED ORDER — ALENDRONATE SODIUM 70 MG PO TABS: ORAL_TABLET | ORAL | 0 refills | Status: DC

## 2021-05-15 NOTE — Assessment & Plan Note (Signed)
steroid taper Continue Symbicort and Spiriva Albuterol PRN On chronic oral steroids F/u with Dr Lamonte Sakai

## 2021-05-15 NOTE — Assessment & Plan Note (Signed)
Uses Flonase Chronic irritation likely due to nasal cannula, although patient does not agree. She attributes it to ARB, advised it to be the less likely reason. Nasal saline spray PRN Humidifier to avoid dry air

## 2021-05-15 NOTE — Assessment & Plan Note (Signed)
Refilled Alendronate.

## 2021-05-15 NOTE — Patient Instructions (Signed)
Please continue taking medications as prescribed.  Please get US doppler done as scheduled.

## 2021-05-15 NOTE — Progress Notes (Signed)
Virtual Visit via Telephone Note   This visit type was conducted due to national recommendations for restrictions regarding the COVID-19 Pandemic (e.g. social distancing) in an effort to limit this patient's exposure and mitigate transmission in our community.  Due to her co-morbid illnesses, this patient is at least at moderate risk for complications without adequate follow up.  This format is felt to be most appropriate for this patient at this time.  The patient did not have access to video technology/had technical difficulties with video requiring transitioning to audio format only (telephone).  All issues noted in this document were discussed and addressed.  No physical exam could be performed with this format.  Evaluation Performed:  Follow-up visit  Date:  05/15/2021   ID:  Cindy [name], DOB January 29, 1956, MRN [id number]  Patient Location: Home Provider Location: Office/Clinic  Participants: Patient Location of Patient: Home Location of Provider: Telehealth Consent was obtain for visit to be over via telehealth. I verified that I am speaking with the correct person using two identifiers.  PCP:  Lindell Spar, MD   Chief Complaint:  Leg and arm swelling  History of Present Illness:    Cindy [name] is a 65 y.o. female who has a televisit after an ER visit recently for leg swelling and arm swelling. She was evaluated in ER and is scheduled to get US doppler LE to r/o DVT.  She also c/o worsening dyspnea and wheezing and her PO prednisone dose was increased. She denies any fever, chills, sore throat, nausea or vomiting. She continues to c/o swelling in her nostrils.  The patient does not have symptoms concerning for COVID-19 infection (fever, chills, cough, or new shortness of breath).   Past Medical, Surgical, Social History, Allergies, and Medications have been Reviewed.  Past Medical History:  Diagnosis Date  . 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 Arthritis   . CAD  (coronary artery disease)    a. NSTEMI 07/2018 s/p difficult PCI with overlapping DES to LAD, residual OM2 disease treated medically.  . Chronic diastolic CHF (congestive heart failure) ([place])   . Chronic pain   . Chronic respiratory failure ([place]) On home 02 2-3L  . Compression fracture of lumbar vertebra ([place])   . COPD (chronic obstructive pulmonary disease) ([place])   . Essential hypertension   . Hyperglycemia, drug-induced   . Hyperlipidemia   . Kidney stones 2014   Left side, multiple  . Leukocytosis   . Myocardial infarct ([place])   . Osteoporosis 2013  . Thyroid disease    pt denies  . Tobacco abuse    Past Surgical History:  Procedure Laterality Date  . CERVICAL BIOPSY    . COLONOSCOPY N/A 03/31/2013   Procedure: COLONOSCOPY;  Surgeon: Rogene Houston, MD;  Location[facility];  Service: Endoscopy;  Laterality: N/A;  730  . CORONARY ATHERECTOMY N/A 08/19/2018   Procedure: CORONARY ATHERECTOMY;  Surgeon: Nelva Bush, MD;  Location: [place] [facility];  Service: Cardiovascular;  Laterality: N/A;  . CORONARY STENT INTERVENTION N/A 08/19/2018   Procedure: CORONARY STENT INTERVENTION;  Surgeon: Nelva Bush, MD;  Location: [place] [facility];  Service: Cardiovascular;  Laterality: N/A;  . INTRAVASCULAR ULTRASOUND/IVUS N/A 08/19/2018   Procedure: Intravascular Ultrasound/IVUS;  Surgeon: Nelva Bush, MD;  Location: [facility];  Service: Cardiovascular;  Laterality: N/A;  . LEFT HEART CATH N/A 08/19/2018   Procedure: Left Heart Cath;  Surgeon: Nelva Bush, MD;  Location: [place]  CV LAB;  Service: Cardiovascular;  Laterality: N/A;  . LEFT HEART CATH AND CORONARY ANGIOGRAPHY N/A 08/16/2018   Procedure: LEFT HEART CATH AND CORONARY ANGIOGRAPHY;  Surgeon: Nelva Bush, MD;  Location: [facility];  Service: Cardiovascular;  Laterality: N/A;  . TUBAL LIGATION       Current Meds  Medication Sig  . albuterol (PROVENTIL) (2.5 MG/3ML) 0.083% nebulizer  solution Take 3 mLs (2.5 mg total) by nebulization every 4 (four) hours as needed for wheezing or shortness of breath. Dx: J44.9- COPD.  Marland Kitchen albuterol (VENTOLIN HFA) 108 (90 Base) MCG/ACT inhaler Inhale 2 puffs into the lungs every 4 (four) hours as needed for wheezing or shortness of breath.  Marland Kitchen alendronate (FOSAMAX) 70 MG tablet TAKE 1 TAB BY MOUTH EVERY WEEK ON AN EMPTY STOMACH.  Marland Kitchen atorvastatin (LIPITOR) 80 MG tablet TAKE 1 TABLET BY MOUTH DAILY  . Blood Glucose Monitoring Suppl (BLOOD GLUCOSE SYSTEM PAK) KIT Please dispense based on patient and insurance preference. Use as directed to monitor FSBS 1x daily. Dx: E11.9.  . budesonide-formoterol (SYMBICORT) 160-4.5 MCG/ACT inhaler Inhale 2 puffs into the lungs 2 (two) times daily.  . Calcium Carb-Cholecalciferol 600-800 MG-UNIT TABS Take 1 tablet by mouth 2 (two) times daily.   . carbamide peroxide ([name]) 6.5 % OTIC solution Place 5 drops into both ears 2 (two) times daily.  . clopidogrel (PLAVIX) 75 MG tablet Take 1 tablet (75 mg total) by mouth daily.  . clotrimazole-betamethasone (LOTRISONE) cream APPLY TO AFFECTED AREA TWICE DAILY.  Marland Kitchen doxycycline (VIBRAMYCIN) 100 MG capsule Take 1 capsule (100 mg total) by mouth 2 (two) times daily for 7 days.  Marland Kitchen ezetimibe (ZETIA) 10 MG tablet TAKE 1 TABLET(10 MG) BY MOUTH DAILY  . furosemide (LASIX) 40 MG tablet ALTERNATE TAKING 2 TABLETS ONE DAY THEN 1 TABLET NEXT DAY AND REPEAT  . glipiZIDE (GLUCOTROL) 10 MG tablet Take 1 tablet (10 mg total) by mouth 2 (two) times daily before a meal.  . Glucose Blood (BLOOD GLUCOSE TEST STRIPS) STRP Please dispense based on patient and insurance preference. Use as directed to monitor FSBS 1x daily. Dx: E11.9.  . guaiFENesin-codeine 100-10 MG/5ML syrup Take 5 mLs by mouth every 6 (six) hours as needed.  . hydrOXYzine (VISTARIL) 50 MG capsule Take 1 capsule (50 mg total) by mouth 3 (three) times daily as needed.  Marland Kitchen ipratropium (ATROVENT) 0.03 % nasal spray Place 2 sprays into  both nostrils every 12 (twelve) hours.  . Lancets MISC Please dispense based on patient and insurance preference. Use as directed to monitor FSBS 1x daily. Dx: E11.9.  . LINZESS 145 MCG CAPS capsule TAKE 1 CAPSULE BY MOUTH BEFORE BREAKFAST.  . metoprolol tartrate (LOPRESSOR) 25 MG tablet Take 1 tablet (25 mg total) by mouth 2 (two) times daily.  . mupirocin ointment (BACTROBAN) 2 % Apply to affected areas on buttocks and toe twice a day for 1 week  . nitroGLYCERIN (NITROSTAT) 0.4 MG SL tablet Place 1 tablet (0.4 mg total) under the tongue every 5 (five) minutes as needed for chest pain. If no improvement with 3 doses call 911  . nystatin (MYCOSTATIN) 100000 UNIT/ML suspension Use as directed 5 mLs (500,000 Units total) in the mouth or throat 4 (four) times daily. Swish and swallow  . olmesartan (BENICAR) 20 MG tablet TAKE 1/2 TABLET(10 MG) BY MOUTH DAILY  . oxyCODONE-acetaminophen (PERCOCET) 5-325 MG tablet Take 1 tablet by mouth every 6 (six) hours as needed for severe pain.  . OXYGEN Inhale 3  L into the lungs continuous.   . pantoprazole (PROTONIX) 40 MG tablet Take 1 tablet (40 mg total) by mouth 2 (two) times daily.  . polyethylene glycol (MIRALAX / GLYCOLAX) packet Take 17 g by mouth daily. (Patient taking differently: Take 17 g by mouth daily as needed for mild constipation or moderate constipation.)  . predniSONE (DELTASONE) 10 MG tablet Take 85m once a day  . predniSONE (STERAPRED UNI-PAK 21 TAB) 10 MG (21) TBPK tablet Take by mouth daily. Take 6 tabs by mouth daily  for 2 days, then 5 tabs for 2 days, then 4 tabs for 2 days, then 3 tabs for 2 days, 2 tabs for 2 days, then 1 tab by mouth daily for 2 days  . spironolactone (ALDACTONE) 25 MG tablet Take 1 tablet (25 mg total) by mouth daily.  . Tiotropium Bromide Monohydrate (SPIRIVA RESPIMAT) 2.5 MCG/ACT AERS Inhale 2 puffs into the lungs daily.     Allergies:   Keflex [cephalexin], Sulfa antibiotics, Avelox [moxifloxacin hcl in nacl], and  Toradol [ketorolac tromethamine]   ROS:   Please see the history of present illness.     All other systems reviewed and are negative.   Labs/Other Tests and Data Reviewed:    Recent Labs: 05/12/2021: ALT 34; B Natriuretic Peptide 13.0; BUN 21; Creatinine, Ser 1.29; Hemoglobin 11.3; Magnesium 1.7; Platelets 276; Potassium 4.6; Sodium 137   Recent Lipid Panel Lab Results  Component Value Date/Time   CHOL 153 08/24/2020 09:54 AM   TRIG 215 (H) 08/24/2020 09:54 AM   HDL 46 (L) 08/24/2020 09:54 AM   CHOLHDL 3.3 08/24/2020 09:54 AM   LDLCALC 76 08/24/2020 09:54 AM    Wt Readings from Last 3 Encounters:  05/12/21 187 lb 6.3 oz (85 kg)  03/11/21 182 lb 6.4 oz (82.7 kg)  02/18/21 181 lb (82.1 kg)      ASSESSMENT & PLAN:    Leg swelling Acute on chronic Scheduled to have UKoreadoppler LE to r/o DVT Leg elevation and compression socks Lasix PRN  Allergic rhinitis Uses Flonase Chronic irritation likely due to nasal cannula, although patient does not agree. She attributes it to ARB, advised it to be the less likely reason. Nasal saline spray PRN Humidifier to avoid dry air  COPD with acute exacerbation (HCC) steroid taper Continue Symbicort and Spiriva Albuterol PRN On chronic oral steroids F/u with Dr BLamonte Sakai Osteoporosis Refilled Alendronate   Time:   Today, I have spent 13 minutes reviewing the chart, including problem list, medications, and with the patient with telehealth technology discussing the above problems.   Medication Adjustments/Labs and Tests Ordered: Current medicines are reviewed at length with the patient today.  Concerns regarding medicines are outlined above.   Tests Ordered: No orders of the defined types were placed in this encounter.   Medication Changes: No orders of the defined types were placed in this encounter.    Note: This dictation was prepared with Dragon dictation along with smaller phrase technology. Similar sounding words can be  transcribed inadequately or may not be corrected upon review. Any transcriptional errors that result from this process are unintentional.      Disposition:  Follow up  Signed, RLindell Spar MD  05/15/2021 3:00 PM     [place]

## 2021-05-16 ENCOUNTER — Encounter: Payer: Self-pay | Admitting: Internal Medicine

## 2021-05-16 ENCOUNTER — Other Ambulatory Visit: Payer: Self-pay | Admitting: Internal Medicine

## 2021-05-16 ENCOUNTER — Telehealth: Payer: Self-pay | Admitting: *Deleted

## 2021-05-16 ENCOUNTER — Telehealth: Payer: Self-pay

## 2021-05-16 ENCOUNTER — Ambulatory Visit (HOSPITAL_COMMUNITY): Admission: RE | Admit: 2021-05-16 | Payer: Medicare Other | Source: Ambulatory Visit

## 2021-05-16 ENCOUNTER — Other Ambulatory Visit: Payer: Self-pay | Admitting: *Deleted

## 2021-05-16 DIAGNOSIS — M8080XD Other osteoporosis with current pathological fracture, unspecified site, subsequent encounter for fracture with routine healing: Secondary | ICD-10-CM

## 2021-05-16 DIAGNOSIS — M545 Low back pain, unspecified: Secondary | ICD-10-CM

## 2021-05-16 MED ORDER — OXYCODONE-ACETAMINOPHEN 5-325 MG PO TABS: 1.0000 | ORAL_TABLET | Freq: Three times a day (TID) | ORAL | 0 refills | Status: DC | PRN

## 2021-05-16 MED ORDER — ALENDRONATE SODIUM 70 MG PO TABS: ORAL_TABLET | ORAL | 0 refills | Status: AC

## 2021-05-16 NOTE — Telephone Encounter (Signed)
She needs to use Nasal saline spray - Ocean spray for nose issue. It is due to the nasal cannula related irritation. Please ask her if she is willing to see ENT specialist for this chronic concern.

## 2021-05-16 NOTE — Telephone Encounter (Signed)
Patient called need this medicine alendronate (FOSAMAX) 70 MG tablet  to be sent into [facility] instead of [facility].   Patient asking for some type of medicine to be called into [facility] for her face swelling and help her breath through her nose.

## 2021-05-16 NOTE — Telephone Encounter (Signed)
Spoke with [facility] pt wanted hydrocodone not percocet so she did not pick up prescription

## 2021-05-16 NOTE — Telephone Encounter (Signed)
Sending the same prescription to [facility]. Not Hydrocodone.

## 2021-05-16 NOTE — Telephone Encounter (Signed)
Refilled for now. Thanks.

## 2021-05-16 NOTE — Telephone Encounter (Signed)
Patient brother stop by ask if provider will send in [place] to [facility] on [address]

## 2021-05-16 NOTE — Telephone Encounter (Signed)
Patient called said [facility] does not do the Percocet patient cancelled at [facility] and asked if Hydrocodone can be called into [place].

## 2021-05-16 NOTE — Telephone Encounter (Signed)
Pt has been using ocean spray and still is sore advised we could refer to ENT since this was chronic condition she said she did not want the referral right now she did not go and have US done yesterday as it was too hot.

## 2021-05-16 NOTE — Telephone Encounter (Signed)
Fosamax sent to [facility]. Pt just had visit with you yesterday. Please advise

## 2021-05-17 ENCOUNTER — Telehealth: Payer: Self-pay

## 2021-05-17 NOTE — Telephone Encounter (Signed)
Okay. In that case, we are not going to do anything. Thanks.

## 2021-05-17 NOTE — Telephone Encounter (Signed)
Patient brother Louie Casa stopped by asking about the Oxycodone send to [place].

## 2021-05-17 NOTE — Telephone Encounter (Signed)
It was sent to [facility] as per their request. It is a controlled medicine. Can't keep changing the pharmacies.

## 2021-05-17 NOTE — Telephone Encounter (Signed)
Attempted to notify pt line was busy

## 2021-05-22 ENCOUNTER — Telehealth: Payer: Self-pay

## 2021-05-22 ENCOUNTER — Ambulatory Visit (HOSPITAL_COMMUNITY): Payer: Medicare Other

## 2021-05-22 NOTE — Telephone Encounter (Signed)
Patient called today about her dismissal from the office per Dr Posey Pronto.  I told her as the note says, she was dismissed for non-compliance.  She was not following the providers instructions.  He brother came to the office yesterday wanting to know why she was dismissed and she called this am.

## 2021-05-28 ENCOUNTER — Other Ambulatory Visit: Payer: Medicare Other | Admitting: Urology

## 2021-05-30 ENCOUNTER — Other Ambulatory Visit: Payer: Self-pay | Admitting: Internal Medicine

## 2021-05-30 ENCOUNTER — Other Ambulatory Visit: Payer: Self-pay | Admitting: *Deleted

## 2021-05-30 ENCOUNTER — Other Ambulatory Visit: Payer: Self-pay | Admitting: Family Medicine

## 2021-05-30 ENCOUNTER — Telehealth: Payer: Self-pay

## 2021-05-30 DIAGNOSIS — K219 Gastro-esophageal reflux disease without esophagitis: Secondary | ICD-10-CM

## 2021-05-30 DIAGNOSIS — J019 Acute sinusitis, unspecified: Secondary | ICD-10-CM

## 2021-05-30 MED ORDER — PANTOPRAZOLE SODIUM 40 MG PO TBEC: 40.0000 mg | DELAYED_RELEASE_TABLET | Freq: Two times a day (BID) | ORAL | 0 refills | Status: DC

## 2021-05-30 MED ORDER — ALBUTEROL SULFATE HFA 108 (90 BASE) MCG/ACT IN AERS: 2.0000 | INHALATION_SPRAY | RESPIRATORY_TRACT | 1 refills | Status: AC | PRN

## 2021-05-30 NOTE — Telephone Encounter (Signed)
Patient has transferred care to your office.  

## 2021-05-30 NOTE — Telephone Encounter (Signed)
Pantapozole called in for pt however cough syrup was not this is not an everyday medication.

## 2021-05-30 NOTE — Telephone Encounter (Signed)
Patient need refill:  pantoprazole (PROTONIX) 40 MG tablet   guaiFENesin-codeine 100-10 MG/5ML syrup   Pharmacy: [facility]

## 2021-05-30 NOTE — Telephone Encounter (Signed)
Patients brother came into the office requesting a refill on ventolin inhaler send to [place] did she where she has been dismissed but I wasn't sure how that works with the 30 days if that included meds or not if not just let me know and I can call and tell her

## 2021-05-30 NOTE — Telephone Encounter (Signed)
Called patient

## 2021-05-30 NOTE — Telephone Encounter (Signed)
Pt was dismissed refill sent as she is still in the 30 day window for refills

## 2021-05-31 ENCOUNTER — Other Ambulatory Visit: Payer: Self-pay | Admitting: *Deleted

## 2021-05-31 ENCOUNTER — Other Ambulatory Visit: Payer: Self-pay | Admitting: Internal Medicine

## 2021-05-31 ENCOUNTER — Ambulatory Visit (HOSPITAL_COMMUNITY): Payer: Medicare Other

## 2021-05-31 ENCOUNTER — Telehealth: Payer: Self-pay

## 2021-05-31 DIAGNOSIS — J019 Acute sinusitis, unspecified: Secondary | ICD-10-CM

## 2021-05-31 DIAGNOSIS — I10 Essential (primary) hypertension: Secondary | ICD-10-CM

## 2021-05-31 MED ORDER — SPIRONOLACTONE 25 MG PO TABS: 25.0000 mg | ORAL_TABLET | Freq: Every day | ORAL | 3 refills | Status: AC

## 2021-05-31 NOTE — Telephone Encounter (Signed)
Spironolactone sent to pharmacy atorvastatin prescribed by cardiology would need to call them for refill

## 2021-05-31 NOTE — Telephone Encounter (Signed)
FYI

## 2021-05-31 NOTE — Telephone Encounter (Signed)
Pt needs Spironolactone & Atorvastatin called in to [facility]

## 2021-06-02 ENCOUNTER — Other Ambulatory Visit: Payer: Self-pay | Admitting: Family Medicine

## 2021-06-02 DIAGNOSIS — K219 Gastro-esophageal reflux disease without esophagitis: Secondary | ICD-10-CM

## 2021-06-03 ENCOUNTER — Telehealth: Payer: Self-pay

## 2021-06-03 ENCOUNTER — Other Ambulatory Visit: Payer: Self-pay | Admitting: Family Medicine

## 2021-06-03 DIAGNOSIS — K219 Gastro-esophageal reflux disease without esophagitis: Secondary | ICD-10-CM

## 2021-06-03 NOTE — Telephone Encounter (Signed)
We will not refill antibiotics without something going on

## 2021-06-03 NOTE — Telephone Encounter (Signed)
Patient has transferred care to your office.  

## 2021-06-03 NOTE — Telephone Encounter (Signed)
thanks

## 2021-06-04 ENCOUNTER — Telehealth: Payer: Self-pay | Admitting: Emergency Medicine

## 2021-06-04 NOTE — Telephone Encounter (Signed)
I called and spoke with patient regarding message. Patient is due to for a 3 month f/u with Dr. Lamonte Sakai and is requesting the f/u be a televisit due to it being too hot outside for patient to come out. Patient stated Dr. Lamonte Sakai always lets it be a televisit. Patient is also requesting Dr. Lamonte Sakai help with her meds. Patient was seeing new cardiology dr but apparently was not being compliant with care and provider dropped her as a patient. I informed patient that providers normally do not refill meds they did not prescribed but I will route the message to Dr. Lamonte Sakai.  Dr. Lamonte Sakai, please advise. Thanks!

## 2021-06-05 NOTE — Telephone Encounter (Signed)
Called and spoke with patient. She has been scheduled with RB on 06/28/21 at 1145am as a televisit.   Nothing further needed at time of call.

## 2021-06-05 NOTE — Telephone Encounter (Signed)
I am ok with a tele-visit  I don't want to commit to filling scripts from her other providers at this time. We can discuss further at our [place]

## 2021-06-06 ENCOUNTER — Other Ambulatory Visit: Payer: Self-pay | Admitting: Internal Medicine

## 2021-06-06 DIAGNOSIS — I10 Essential (primary) hypertension: Secondary | ICD-10-CM

## 2021-06-11 ENCOUNTER — Ambulatory Visit: Payer: Medicare Other | Admitting: Internal Medicine

## 2021-06-21 ENCOUNTER — Ambulatory Visit (HOSPITAL_COMMUNITY)
Admission: RE | Admit: 2021-06-21 | Discharge: 2021-06-21 | Disposition: A | Discharge status: Home / Self Care | Payer: Medicare Other | Source: Ambulatory Visit | Attending: Student | Admitting: Student

## 2021-06-21 ENCOUNTER — Encounter (HOSPITAL_COMMUNITY): Payer: Self-pay

## 2021-06-21 ENCOUNTER — Emergency Department (HOSPITAL_COMMUNITY): Payer: Medicare Other

## 2021-06-21 ENCOUNTER — Other Ambulatory Visit: Payer: Self-pay

## 2021-06-21 ENCOUNTER — Emergency Department (HOSPITAL_COMMUNITY)
Admission: EM | Admit: 2021-06-21 | Discharge: 2021-06-21 | Disposition: A | Discharge status: Home / Self Care | Payer: Medicare Other | Attending: Emergency Medicine | Admitting: Emergency Medicine

## 2021-06-21 DIAGNOSIS — M549 Dorsalgia, unspecified: Secondary | ICD-10-CM | POA: Diagnosis not present

## 2021-06-21 DIAGNOSIS — Z7984 Long term (current) use of oral hypoglycemic drugs: Secondary | ICD-10-CM | POA: Insufficient documentation

## 2021-06-21 DIAGNOSIS — D72829 Elevated white blood cell count, unspecified: Secondary | ICD-10-CM | POA: Diagnosis not present

## 2021-06-21 DIAGNOSIS — G894 Chronic pain syndrome: Secondary | ICD-10-CM | POA: Insufficient documentation

## 2021-06-21 DIAGNOSIS — Z7951 Long term (current) use of inhaled steroids: Secondary | ICD-10-CM | POA: Insufficient documentation

## 2021-06-21 DIAGNOSIS — R0602 Shortness of breath: Secondary | ICD-10-CM | POA: Diagnosis not present

## 2021-06-21 DIAGNOSIS — I5032 Chronic diastolic (congestive) heart failure: Secondary | ICD-10-CM | POA: Insufficient documentation

## 2021-06-21 DIAGNOSIS — Z87891 Personal history of nicotine dependence: Secondary | ICD-10-CM | POA: Insufficient documentation

## 2021-06-21 DIAGNOSIS — Z20822 Contact with and (suspected) exposure to covid-19: Secondary | ICD-10-CM | POA: Insufficient documentation

## 2021-06-21 DIAGNOSIS — Z79899 Other long term (current) drug therapy: Secondary | ICD-10-CM | POA: Diagnosis not present

## 2021-06-21 DIAGNOSIS — Z7902 Long term (current) use of antithrombotics/antiplatelets: Secondary | ICD-10-CM | POA: Diagnosis not present

## 2021-06-21 DIAGNOSIS — J441 Chronic obstructive pulmonary disease with (acute) exacerbation: Secondary | ICD-10-CM | POA: Insufficient documentation

## 2021-06-21 DIAGNOSIS — I2510 Atherosclerotic heart disease of native coronary artery without angina pectoris: Secondary | ICD-10-CM | POA: Insufficient documentation

## 2021-06-21 DIAGNOSIS — M7989 Other specified soft tissue disorders: Secondary | ICD-10-CM | POA: Diagnosis not present

## 2021-06-21 DIAGNOSIS — I110 Hypertensive heart disease with heart failure: Secondary | ICD-10-CM | POA: Insufficient documentation

## 2021-06-21 LAB — CBC WITH DIFFERENTIAL/PLATELET
Abs Immature Granulocytes: 0.2 10*3/uL — ABNORMAL HIGH (ref 0.00–0.07)
Basophils Absolute: 0.1 10*3/uL (ref 0.0–0.1)
Basophils Relative: 1 %
Eosinophils Absolute: 0 10*3/uL (ref 0.0–0.5)
Eosinophils Relative: 0 %
HCT: 39.5 % (ref 36.0–46.0)
Hemoglobin: 12.5 g/dL (ref 12.0–15.0)
Immature Granulocytes: 1 %
Lymphocytes Relative: 12 %
Lymphs Abs: 1.9 10*3/uL (ref 0.7–4.0)
MCH: 30.6 pg (ref 26.0–34.0)
MCHC: 31.6 g/dL (ref 30.0–36.0)
MCV: 96.8 fL (ref 80.0–100.0)
Monocytes Absolute: 1.3 10*3/uL — ABNORMAL HIGH (ref 0.1–1.0)
Monocytes Relative: 8 %
Neutro Abs: 12.7 10*3/uL — ABNORMAL HIGH (ref 1.7–7.7)
Neutrophils Relative %: 78 %
Platelets: 317 10*3/uL (ref 150–400)
RBC: 4.08 MIL/uL (ref 3.87–5.11)
RDW: 14.8 % (ref 11.5–15.5)
WBC: 16.2 10*3/uL — ABNORMAL HIGH (ref 4.0–10.5)
nRBC: 0 % (ref 0.0–0.2)

## 2021-06-21 LAB — BASIC METABOLIC PANEL
Anion gap: 13 (ref 5–15)
BUN: 16 mg/dL (ref 8–23)
CO2: 35 mmol/L — ABNORMAL HIGH (ref 22–32)
Calcium: 8.7 mg/dL — ABNORMAL LOW (ref 8.9–10.3)
Chloride: 86 mmol/L — ABNORMAL LOW (ref 98–111)
Creatinine, Ser: 1.22 mg/dL — ABNORMAL HIGH (ref 0.44–1.00)
GFR, Estimated: 49 mL/min — ABNORMAL LOW (ref >60)
Glucose, Bld: 356 mg/dL — ABNORMAL HIGH (ref 70–99)
Potassium: 3.6 mmol/L (ref 3.5–5.1)
Sodium: 134 mmol/L — ABNORMAL LOW (ref 135–145)

## 2021-06-21 LAB — TROPONIN I (HIGH SENSITIVITY)
Troponin I (High Sensitivity): 8 ng/L (ref <18)
Troponin I (High Sensitivity): 7 ng/L (ref <18)

## 2021-06-21 LAB — RESP PANEL BY RT-PCR (FLU A&B, COVID) ARPGX2
Influenza A by PCR: NEGATIVE
Influenza B by PCR: NEGATIVE
SARS Coronavirus 2 by RT PCR: NEGATIVE

## 2021-06-21 LAB — BRAIN NATRIURETIC PEPTIDE: B Natriuretic Peptide: 26 pg/mL (ref 0.0–100.0)

## 2021-06-21 MED ORDER — IPRATROPIUM-ALBUTEROL 0.5-2.5 (3) MG/3ML IN SOLN
3.0000 mL | Freq: Once | RESPIRATORY_TRACT | Status: AC
  Administered 2021-06-21: 3 mL via RESPIRATORY_TRACT
  Filled 2021-06-21: qty 3

## 2021-06-21 MED ORDER — METHYLPREDNISOLONE SODIUM SUCC 125 MG IJ SOLR
125.0000 mg | Freq: Once | INTRAMUSCULAR | Status: AC
  Administered 2021-06-21: 125 mg via INTRAVENOUS
  Filled 2021-06-21: qty 2

## 2021-06-21 MED ORDER — ALBUTEROL SULFATE (2.5 MG/3ML) 0.083% IN NEBU
2.5000 mg | INHALATION_SOLUTION | Freq: Once | RESPIRATORY_TRACT | Status: AC
  Administered 2021-06-21: 2.5 mg via RESPIRATORY_TRACT
  Filled 2021-06-21: qty 3

## 2021-06-21 MED ORDER — GUAIFENESIN-DM 100-10 MG/5ML PO SYRP
5.0000 mL | ORAL_SOLUTION | Freq: Once | ORAL | Status: AC
  Administered 2021-06-21: 5 mL via ORAL
  Filled 2021-06-21: qty 5

## 2021-06-21 MED ORDER — MORPHINE SULFATE (PF) 2 MG/ML IV SOLN
2.0000 mg | Freq: Once | INTRAVENOUS | Status: AC
  Administered 2021-06-21: 2 mg via INTRAVENOUS
  Filled 2021-06-21: qty 1

## 2021-06-21 MED ORDER — PREDNISONE 10 MG (21) PO TBPK: ORAL_TABLET | Freq: Every day | ORAL | 0 refills | Status: DC

## 2021-06-21 NOTE — ED Triage Notes (Signed)
Pt reports face and hands swelling, worsening sob, and heart racing x 1 week.  Reports HR has been 100-120 and bp has been as low as 79'X systolic.

## 2021-06-21 NOTE — ED Notes (Signed)
Patient refuses to keep blood pressure cuff on and refuses to allow [place] to take a blood pressure. Patient states "it is too painful".  This hinders proper vital signs assessment.

## 2021-06-21 NOTE — ED Provider Notes (Signed)
[facility] EMERGENCY DEPARTMENT Provider Note  CSN: [id number] Arrival date & time: 06/21/21 0915    History Chief Complaint  Patient presents with   Shortness of Breath     Shortness of Breath  Cindy [name] is a 65 y.o. female with history of multiple medical problems including COPD and chronic back pain reports she has had increasing SOB for the last week with dry cough. She states her HR has been elevated. She has had swelling in RLE for some time, states she was supposed to have an outpatient doppler today (ordered at ED visit 5/15 but has not been able to go due to her chronic pain.  She is requesting solumedrol and a 'morphine shot'. Denies fever, cough has been non-productive. She has some chest soreness.    Past Medical History:  Diagnosis Date   Adrenal adenoma 2014   Left   Arthritis    CAD (coronary artery disease)    a. NSTEMI 07/2018 s/p difficult PCI with overlapping DES to LAD, residual OM2 disease treated medically.   Chronic diastolic CHF (congestive heart failure) (HCC)    Chronic pain    Chronic respiratory failure ([place]) On home 02 2-3L   Compression fracture of lumbar vertebra (HCC)    COPD (chronic obstructive pulmonary disease) ([place])    Essential hypertension    Hyperglycemia, drug-induced    Hyperlipidemia    Kidney stones 2014   Left side, multiple   Leukocytosis    Myocardial infarct [facility])    Osteoporosis 2013   Thyroid disease    pt denies   Tobacco abuse     Past Surgical History:  Procedure Laterality Date   CERVICAL BIOPSY     COLONOSCOPY N/A 03/31/2013   Procedure: COLONOSCOPY;  Surgeon: Rogene Houston, MD;  Location: [facility];  Service: Endoscopy;  Laterality: N/A;  730   CORONARY ATHERECTOMY N/A 08/19/2018   Procedure: CORONARY ATHERECTOMY;  Surgeon: Nelva Bush, MD;  Location: [place] CV LAB;  Service: Cardiovascular;  Laterality: N/A;   CORONARY STENT INTERVENTION N/A 08/19/2018   Procedure: CORONARY STENT  INTERVENTION;  Surgeon: Nelva Bush, MD;  Location: [place] CV LAB;  Service: Cardiovascular;  Laterality: N/A;   INTRAVASCULAR ULTRASOUND/IVUS N/A 08/19/2018   Procedure: Intravascular Ultrasound/IVUS;  Surgeon: Nelva Bush, MD;  Location: [place] CV LAB;  Service: Cardiovascular;  Laterality: N/A;   LEFT HEART CATH N/A 08/19/2018   Procedure: Left Heart Cath;  Surgeon: Nelva Bush, MD;  Location: [place] CV LAB;  Service: Cardiovascular;  Laterality: N/A;   LEFT HEART CATH AND CORONARY ANGIOGRAPHY N/A 08/16/2018   Procedure: LEFT HEART CATH AND CORONARY ANGIOGRAPHY;  Surgeon: Nelva Bush, MD;  Location: [place] CV LAB;  Service: Cardiovascular;  Laterality: N/A;   TUBAL LIGATION      Family History  Problem Relation Age of Onset   Heart disease Mother    Hypertension Sister    Hypertension Brother     Social History   Tobacco Use   Smoking status: Former    Packs/day: 1.00    Years: 25.00    Pack years: 25.00    Types: Cigarettes   Smokeless tobacco: Never   Tobacco comments:    2-5 cigarettes smoked daily 04/02/21 [facility]   Vaping Use   Vaping Use: Never used  Substance Use Topics   Alcohol use: No   Drug use: No     Home Medications Prior to Admission medications   Medication Sig Start Date End  Date Taking? Authorizing Provider  albuterol (PROVENTIL) (2.5 MG/3ML) 0.083% nebulizer solution Take 3 mLs (2.5 mg total) by nebulization every 4 (four) hours as needed for wheezing or shortness of breath. Dx: J44.9- COPD. 05/06/21  Yes Lindell Spar, MD  albuterol (VENTOLIN HFA) 108 (90 Base) MCG/ACT inhaler Inhale 2 puffs into the lungs every 4 (four) hours as needed for wheezing or shortness of breath. 05/30/21  Yes Lindell Spar, MD  alendronate (FOSAMAX) 70 MG tablet TAKE 1 TAB BY MOUTH EVERY WEEK ON AN EMPTY STOMACH. 05/16/21  Yes Lindell Spar, MD  atorvastatin (LIPITOR) 80 MG tablet TAKE 1 TABLET BY MOUTH DAILY 01/15/21  Yes Strader, [place] M,  PA-C  Blood Glucose Monitoring Suppl (BLOOD GLUCOSE SYSTEM PAK) KIT Please dispense based on patient and insurance preference. Use as directed to monitor FSBS 1x daily. Dx: E11.9. 09/29/19  Yes [place], Modena Nunnery, MD  budesonide-formoterol [facility]) 160-4.5 MCG/ACT inhaler Inhale 2 puffs into the lungs 2 (two) times daily. 05/06/21  Yes Lindell Spar, MD  Calcium Carb-Cholecalciferol 600-800 MG-UNIT TABS Take 1 tablet by mouth 2 (two) times daily.    Yes [provider]  clopidogrel (PLAVIX) 75 MG tablet Take 1 tablet (75 mg total) by mouth daily. 05/06/21  Yes Lindell Spar, MD  clotrimazole-betamethasone (LOTRISONE) cream APPLY TO AFFECTED AREA TWICE DAILY. 11/14/19  Yes [place], Modena Nunnery, MD  ezetimibe (ZETIA) 10 MG tablet TAKE 1 TABLET(10 MG) BY MOUTH DAILY 04/15/21  Yes Strader, [place] M, PA-C  furosemide (LASIX) 40 MG tablet ALTERNATE TAKING 2 TABLETS ONE DAY THEN 1 TABLET NEXT DAY AND REPEAT 04/18/21  Yes Lindell Spar, MD  glipiZIDE (GLUCOTROL) 10 MG tablet Take 1 tablet (10 mg total) by mouth 2 (two) times daily before a meal. 02/27/21  Yes [place], Modena Nunnery, MD  Glucose Blood (BLOOD GLUCOSE TEST STRIPS) STRP Please dispense based on patient and insurance preference. Use as directed to monitor FSBS 1x daily. Dx: E11.9. 09/29/19  Yes [place], Modena Nunnery, MD  guaiFENesin-codeine 100-10 MG/5ML syrup Take 5 mLs by mouth every 6 (six) hours as needed. 03/28/21  Yes Lindell Spar, MD  hydrOXYzine (VISTARIL) 50 MG capsule Take 1 capsule (50 mg total) by mouth 3 (three) times daily as needed. 05/06/21  Yes Lindell Spar, MD  ipratropium (ATROVENT) 0.03 % nasal spray Place 2 sprays into both nostrils every 12 (twelve) hours. 04/04/21  Yes Lindell Spar, MD  Lancets MISC Please dispense based on patient and insurance preference. Use as directed to monitor FSBS 1x daily. Dx: E11.9. 04/18/21  Yes [place], Colin Broach, MD  LINZESS 145 MCG CAPS capsule TAKE 1 CAPSULE BY MOUTH BEFORE BREAKFAST. 02/13/21  Yes  [place], Modena Nunnery, MD  metoprolol tartrate (LOPRESSOR) 25 MG tablet Take 1 tablet (25 mg total) by mouth 2 (two) times daily. 02/13/21  Yes [place], Modena Nunnery, MD  mupirocin ointment (BACTROBAN) 2 % Apply to affected areas on buttocks and toe twice a day for 1 week 02/18/21  Yes [place], Modena Nunnery, MD  nitroGLYCERIN (NITROSTAT) 0.4 MG SL tablet Place 1 tablet (0.4 mg total) under the tongue every 5 (five) minutes as needed for chest pain. If no improvement with 3 doses call 911 08/27/18  Yes Delsa Grana, PA-C  OXYGEN Inhale 3 L into the lungs continuous.    Yes [provider]  pantoprazole (PROTONIX) 40 MG tablet TAKE 1 TABLET(40 MG) BY MOUTH TWICE DAILY 06/04/21  Yes Lindell Spar, MD  polyethylene  glycol (MIRALAX / GLYCOLAX) packet Take 17 g by mouth daily. Patient taking differently: Take 17 g by mouth daily as needed for mild constipation or moderate constipation. 06/04/18  Yes Varney Biles, MD  predniSONE (DELTASONE) 10 MG tablet Take 71m once a day 02/18/21  Yes [place], KModena Nunnery MD  spironolactone (ALDACTONE) 25 MG tablet Take 1 tablet (25 mg total) by mouth daily. 05/31/21  Yes PLindell Spar MD  Tiotropium Bromide Monohydrate (SPIRIVA RESPIMAT) 2.5 MCG/ACT AERS Inhale 2 puffs into the lungs daily. 02/22/21  Yes [place], KModena Nunnery MD  carbamide peroxide ([name]) 6.5 % OTIC solution Place 5 drops into both ears 2 (two) times daily. Patient not taking: No sig reported 03/11/21   PLindell Spar MD  nystatin (MYCOSTATIN) 100000 UNIT/ML suspension Use as directed 5 mLs (500,000 Units total) in the mouth or throat 4 (four) times daily. Swish and swallow Patient not taking: No sig reported 03/11/21   PLindell Spar MD  olmesartan (BENICAR) 20 MG tablet TAKE 1/2 TABLET(10 MG) BY MOUTH DAILY Patient not taking: No sig reported 04/22/21   GNoreene Larsson NP  potassium chloride SA (KLOR-CON) 20 MEQ tablet Take 1 tablet (20 mEq total) by mouth daily for 3 days. 08/28/20 08/31/20  DAlycia Rossetti  MD  predniSONE (STERAPRED UNI-PAK 21 TAB) 10 MG (21) TBPK tablet Take by mouth daily. Take 6 tabs by mouth daily  for 2 days, then 5 tabs for 2 days, then 4 tabs for 2 days, then 3 tabs for 2 days, 2 tabs for 2 days, then 1 tab by mouth daily for 2 days 06/21/21   STruddie Hidden MD     Allergies    Keflex [cephalexin], Sulfa antibiotics, Avelox [moxifloxacin hcl in nacl], and Toradol [ketorolac tromethamine]   Review of Systems   Review of Systems  Respiratory:  Positive for shortness of breath.   A comprehensive review of systems was completed and negative except as noted in HPI.    Physical Exam BP 130/87   Pulse (!) 104   Temp 98.5 F (36.9 C) (Oral)   Resp 17   Ht _0  (1.575 m)   Wt 81.6 kg   SpO2 95%   BMI 32.92 kg/m   Physical Exam Vitals and nursing note reviewed.  Constitutional:      Appearance: Normal appearance.  HENT:     Head: Normocephalic and atraumatic.     Nose: Nose normal.     Mouth/Throat:     Mouth: Mucous membranes are moist.  Eyes:     Extraocular Movements: Extraocular movements intact.     Conjunctiva/sclera: Conjunctivae normal.  Cardiovascular:     Rate and Rhythm: Normal rate.  Pulmonary:     Effort: Pulmonary effort is normal. Tachypnea present.     Breath sounds: Wheezing present.  Abdominal:     General: Abdomen is flat.     Palpations: Abdomen is soft.     Tenderness: There is no abdominal tenderness.  Musculoskeletal:        General: No swelling. Normal range of motion.     Cervical back: Neck supple.     Right lower leg: Tenderness present. Edema present.     Left lower leg: Tenderness present. No edema.     Comments: Low back tenderness  Skin:    General: Skin is warm and dry.  Neurological:     General: No focal deficit present.     Mental Status: She is alert.  Psychiatric:  Mood and Affect: Mood normal.     ED Results / Procedures / Treatments   Labs (all labs ordered are listed, but only abnormal  results are displayed) Labs Reviewed  BASIC METABOLIC PANEL - Abnormal; Notable for the following components:      Result Value   Sodium 134 (*)    Chloride 86 (*)    CO2 35 (*)    Glucose, Bld 356 (*)    Creatinine, Ser 1.22 (*)    Calcium 8.7 (*)    GFR, Estimated 49 (*)    All other components within normal limits  CBC WITH DIFFERENTIAL/PLATELET - Abnormal; Notable for the following components:   WBC 16.2 (*)    Neutro Abs 12.7 (*)    Monocytes Absolute 1.3 (*)    Abs Immature Granulocytes 0.20 (*)    All other components within normal limits  RESP PANEL BY RT-PCR (FLU A&B, COVID) ARPGX2  BRAIN NATRIURETIC PEPTIDE  TROPONIN I (HIGH SENSITIVITY)  TROPONIN I (HIGH SENSITIVITY)    EKG EKG Interpretation  Date/Time:  Friday June 21 2021 10:03:03 EDT Ventricular Rate:  105 PR Interval:  150 QRS Duration: 134 QT Interval:  382 QTC Calculation: 505 R Axis:   102 Text Interpretation: Sinus tachycardia RBBB and LPFB No significant change since last tracing Confirmed by Calvert Cantor [id number]) on 06/21/2021 10:07:40 AM  Radiology US Venous Img Lower Right (DVT Study)  Result Date: 06/21/2021 CLINICAL DATA:  Right lower extremity swelling. EXAM: RIGHT LOWER EXTREMITY VENOUS DOPPLER ULTRASOUND TECHNIQUE: Gray-scale sonography with graded compression, as well as color Doppler and duplex ultrasound were performed to evaluate the lower extremity deep venous systems from the level of the common femoral vein and including the common femoral, femoral, profunda femoral, popliteal and calf veins including the posterior tibial, peroneal and gastrocnemius veins when visible. The superficial great saphenous vein was also interrogated. Spectral Doppler was utilized to evaluate flow at rest and with distal augmentation maneuvers in the common femoral, femoral and popliteal veins. COMPARISON:  08/23/2018 FINDINGS: Contralateral Common Femoral Vein: Respiratory phasicity is normal and symmetric with  the symptomatic side. No evidence of thrombus. Normal compressibility. Common Femoral Vein: No evidence of thrombus. Normal compressibility, respiratory phasicity and response to augmentation. Saphenofemoral Junction: No evidence of thrombus. Normal compressibility and flow on color Doppler imaging. Profunda Femoral Vein: No evidence of thrombus. Normal compressibility and flow on color Doppler imaging. Femoral Vein: No evidence of thrombus. Normal compressibility, respiratory phasicity and response to augmentation. Popliteal Vein: No evidence of thrombus. Normal compressibility, respiratory phasicity and response to augmentation. Calf Veins: No evidence of thrombus. Normal compressibility and flow on color Doppler imaging. Superficial Great Saphenous Vein: Limited evaluation. Other Findings:  None. IMPRESSION: Negative for deep venous thrombosis in right lower extremity. Electronically Signed   By: Markus Daft M.D.   On: 06/21/2021 11:32   DG Chest Portable 1 View  Result Date: 06/21/2021 CLINICAL DATA:  Short of breath. Hands and face swelling. Heart racing. EXAM: PORTABLE CHEST 1 VIEW COMPARISON:  05/12/2021 and older exams. FINDINGS: Cardiac silhouette is normal in size. Normal mediastinal and hilar contours. Lungs are hyperexpanded. Prominent interstitial markings, stable. No evidence of pneumonia or pulmonary edema. No pleural effusion or pneumothorax. Skeletal structures are grossly intact. IMPRESSION: No acute cardiopulmonary disease. Electronically Signed   By: Lajean Manes M.D.   On: 06/21/2021 10:17    Procedures Procedures  Medications Ordered in the ED Medications  methylPREDNISolone sodium succinate (SOLU-MEDROL) 125 mg/2 mL injection 125  mg (125 mg Intravenous Given 06/21/21 1003)  ipratropium-albuterol (DUONEB) 0.5-2.5 (3) MG/3ML nebulizer solution 3 mL (3 mLs Nebulization Given 06/21/21 1129)  albuterol (PROVENTIL) (2.5 MG/3ML) 0.083% nebulizer solution 2.5 mg (2.5 mg Nebulization Given  06/21/21 1427)  morphine 2 MG/ML injection 2 mg (2 mg Intravenous Given 06/21/21 1242)  guaiFENesin-dextromethorphan (ROBITUSSIN DM) 100-10 MG/5ML syrup 5 mL (5 mLs Oral Given 06/21/21 1256)     MDM Rules/Calculators/A&P MDM Patient with COPD exacerbation. Has had RLE edema for several weeks, at least since last ED visit. Has not had outpatient doppler since she was seen in the ED for essentially the same presentation about 6 weeks ago. Will check labs, CXR, give steroids, nebs and reassess.   ED Course  I have reviewed the triage vital signs and the nursing notes.  Pertinent labs & imaging results that were available during my care of the patient were reviewed by me and considered in my medical decision making (see chart for details).  Clinical Course as of 06/21/21 1525  Fri Jun 21, 2021  1008 CBC with leukocytosis, similar to previous, she is on chronic steroids.  [CS]  1855 BMP is at baseline. Trop and BNP are normal.  [CS]  0158 CXR is clear [CS]  1101 Covid is neg.  [CS]  6825 Doppler is neg [CS]  7493 Delta Trop remains normal.  [CS]  5521 Patient still wheezing, will give additional nebs. Morphine for her back pain.  [CS]    Clinical Course User Index [CS] Truddie Hidden, MD    Final Clinical Impression(s) / ED Diagnoses Final diagnoses:  COPD exacerbation ([place])  Chronic pain syndrome    Rx / DC Orders ED Discharge Orders          Ordered    predniSONE (STERAPRED UNI-PAK 21 TAB) 10 MG (21) TBPK tablet  Daily        06/21/21 1525             Truddie Hidden, MD 06/21/21 1525

## 2021-06-25 DIAGNOSIS — J441 Chronic obstructive pulmonary disease with (acute) exacerbation: Secondary | ICD-10-CM | POA: Diagnosis not present

## 2021-06-25 DIAGNOSIS — Z6836 Body mass index (BMI) 36.0-36.9, adult: Secondary | ICD-10-CM | POA: Diagnosis not present

## 2021-06-25 DIAGNOSIS — I509 Heart failure, unspecified: Secondary | ICD-10-CM | POA: Diagnosis not present

## 2021-06-25 DIAGNOSIS — E1169 Type 2 diabetes mellitus with other specified complication: Secondary | ICD-10-CM | POA: Diagnosis not present

## 2021-06-25 DIAGNOSIS — E7800 Pure hypercholesterolemia, unspecified: Secondary | ICD-10-CM | POA: Diagnosis not present

## 2021-06-27 ENCOUNTER — Emergency Department (HOSPITAL_COMMUNITY): Payer: Medicare Other

## 2021-06-27 ENCOUNTER — Other Ambulatory Visit: Payer: Self-pay

## 2021-06-27 ENCOUNTER — Encounter (HOSPITAL_COMMUNITY): Payer: Self-pay | Admitting: *Deleted

## 2021-06-27 ENCOUNTER — Emergency Department (HOSPITAL_COMMUNITY)
Admission: EM | Admit: 2021-06-27 | Discharge: 2021-06-27 | Disposition: A | Discharge status: Home / Self Care | Payer: Medicare Other | Attending: Emergency Medicine | Admitting: Emergency Medicine

## 2021-06-27 DIAGNOSIS — Z20822 Contact with and (suspected) exposure to covid-19: Secondary | ICD-10-CM | POA: Diagnosis not present

## 2021-06-27 DIAGNOSIS — Z7984 Long term (current) use of oral hypoglycemic drugs: Secondary | ICD-10-CM | POA: Diagnosis not present

## 2021-06-27 DIAGNOSIS — Z7902 Long term (current) use of antithrombotics/antiplatelets: Secondary | ICD-10-CM | POA: Insufficient documentation

## 2021-06-27 DIAGNOSIS — M7989 Other specified soft tissue disorders: Secondary | ICD-10-CM | POA: Diagnosis not present

## 2021-06-27 DIAGNOSIS — Z87891 Personal history of nicotine dependence: Secondary | ICD-10-CM | POA: Diagnosis not present

## 2021-06-27 DIAGNOSIS — Z79899 Other long term (current) drug therapy: Secondary | ICD-10-CM | POA: Insufficient documentation

## 2021-06-27 DIAGNOSIS — I110 Hypertensive heart disease with heart failure: Secondary | ICD-10-CM | POA: Insufficient documentation

## 2021-06-27 DIAGNOSIS — I5032 Chronic diastolic (congestive) heart failure: Secondary | ICD-10-CM | POA: Insufficient documentation

## 2021-06-27 DIAGNOSIS — I2510 Atherosclerotic heart disease of native coronary artery without angina pectoris: Secondary | ICD-10-CM | POA: Diagnosis not present

## 2021-06-27 DIAGNOSIS — J441 Chronic obstructive pulmonary disease with (acute) exacerbation: Secondary | ICD-10-CM | POA: Diagnosis not present

## 2021-06-27 DIAGNOSIS — E119 Type 2 diabetes mellitus without complications: Secondary | ICD-10-CM | POA: Insufficient documentation

## 2021-06-27 DIAGNOSIS — I509 Heart failure, unspecified: Secondary | ICD-10-CM | POA: Diagnosis not present

## 2021-06-27 DIAGNOSIS — F17210 Nicotine dependence, cigarettes, uncomplicated: Secondary | ICD-10-CM | POA: Diagnosis not present

## 2021-06-27 DIAGNOSIS — J439 Emphysema, unspecified: Secondary | ICD-10-CM | POA: Diagnosis not present

## 2021-06-27 DIAGNOSIS — E1169 Type 2 diabetes mellitus with other specified complication: Secondary | ICD-10-CM | POA: Diagnosis not present

## 2021-06-27 DIAGNOSIS — R0602 Shortness of breath: Secondary | ICD-10-CM | POA: Diagnosis not present

## 2021-06-27 LAB — BASIC METABOLIC PANEL
Anion gap: 12 (ref 5–15)
BUN: 24 mg/dL — ABNORMAL HIGH (ref 8–23)
CO2: 31 mmol/L (ref 22–32)
Calcium: 9.2 mg/dL (ref 8.9–10.3)
Chloride: 94 mmol/L — ABNORMAL LOW (ref 98–111)
Creatinine, Ser: 1.14 mg/dL — ABNORMAL HIGH (ref 0.44–1.00)
GFR, Estimated: 53 mL/min — ABNORMAL LOW (ref >60)
Glucose, Bld: 338 mg/dL — ABNORMAL HIGH (ref 70–99)
Potassium: 3.3 mmol/L — ABNORMAL LOW (ref 3.5–5.1)
Sodium: 137 mmol/L (ref 135–145)

## 2021-06-27 LAB — CBC
HCT: 38.9 % (ref 36.0–46.0)
Hemoglobin: 12.5 g/dL (ref 12.0–15.0)
MCH: 30.9 pg (ref 26.0–34.0)
MCHC: 32.1 g/dL (ref 30.0–36.0)
MCV: 96.3 fL (ref 80.0–100.0)
Platelets: 326 10*3/uL (ref 150–400)
RBC: 4.04 MIL/uL (ref 3.87–5.11)
RDW: 14.8 % (ref 11.5–15.5)
WBC: 18.2 10*3/uL — ABNORMAL HIGH (ref 4.0–10.5)
nRBC: 0 % (ref 0.0–0.2)

## 2021-06-27 LAB — RESP PANEL BY RT-PCR (FLU A&B, COVID) ARPGX2
Influenza A by PCR: NEGATIVE
Influenza B by PCR: NEGATIVE
SARS Coronavirus 2 by RT PCR: NEGATIVE

## 2021-06-27 MED ORDER — METHYLPREDNISOLONE SODIUM SUCC 125 MG IJ SOLR
125.0000 mg | Freq: Once | INTRAMUSCULAR | Status: DC
  Filled 2021-06-27: qty 2

## 2021-06-27 MED ORDER — HYDROMORPHONE HCL 1 MG/ML IJ SOLN: 1.0000 mg | Freq: Once | INTRAMUSCULAR | Status: DC

## 2021-06-27 MED ORDER — IPRATROPIUM-ALBUTEROL 0.5-2.5 (3) MG/3ML IN SOLN
3.0000 mL | Freq: Once | RESPIRATORY_TRACT | Status: AC
  Administered 2021-06-27: 3 mL via RESPIRATORY_TRACT
  Filled 2021-06-27: qty 3

## 2021-06-27 MED ORDER — METHYLPREDNISOLONE SODIUM SUCC 125 MG IJ SOLR
125.0000 mg | Freq: Once | INTRAMUSCULAR | Status: AC
  Administered 2021-06-27: 125 mg via INTRAMUSCULAR
  Filled 2021-06-27: qty 2

## 2021-06-27 MED ORDER — HYDROMORPHONE HCL 1 MG/ML IJ SOLN
0.5000 mg | Freq: Once | INTRAMUSCULAR | Status: AC
  Administered 2021-06-27: 0.5 mg via INTRAMUSCULAR
  Filled 2021-06-27: qty 1

## 2021-06-27 MED ORDER — ALBUTEROL SULFATE (2.5 MG/3ML) 0.083% IN NEBU
2.5000 mg | INHALATION_SOLUTION | Freq: Once | RESPIRATORY_TRACT | Status: AC
  Administered 2021-06-27: 2.5 mg via RESPIRATORY_TRACT
  Filled 2021-06-27: qty 3

## 2021-06-27 NOTE — Discharge Instructions (Addendum)
Continue taking your present medicines and using your nebulizers.  Follow-up with your doctor next week

## 2021-06-27 NOTE — ED Triage Notes (Signed)
Pt c/o sob and swelling x 2 days

## 2021-06-27 NOTE — ED Provider Notes (Signed)
MSE note.  Patient has a history of COPD.  She presents with shortness of breath.  Physical exam mild to moderate wheezing.  She is tachycardic.  Labs and x-rays ordered along with neb treatments   Milton Ferguson, MD 06/27/21 2056

## 2021-06-27 NOTE — ED Provider Notes (Signed)
[facility] EMERGENCY DEPARTMENT Provider Note   CSN: [id number] Arrival date & time: 06/27/21  6073     History Chief Complaint  Patient presents with   Shortness of Breath    Cindy [name] is a 65 y.o. female.  Patient with COPD exacerbation and shortness of breath.  Patient complains of increased shortness of breath but no fever no chills no cough  The history is provided by the patient and medical records. No language interpreter was used.  Shortness of Breath Severity:  Moderate Onset quality:  Gradual Duration: 3 days. Timing:  Constant Progression:  Waxing and waning Chronicity:  Recurrent Context: activity   Relieved by:  Nothing Worsened by:  Nothing Ineffective treatments:  None tried Associated symptoms: wheezing   Associated symptoms: no abdominal pain, no chest pain, no cough, no headaches and no rash       Past Medical History:  Diagnosis Date   Adrenal adenoma 2014   Left   Arthritis    CAD (coronary artery disease)    a. NSTEMI 07/2018 s/p difficult PCI with overlapping DES to LAD, residual OM2 disease treated medically.   Chronic diastolic CHF (congestive heart failure) (HCC)    Chronic pain    Chronic respiratory failure (HCC) On home 02 2-3L   Compression fracture of lumbar vertebra (HCC)    COPD (chronic obstructive pulmonary disease) ([place])    Essential hypertension    Hyperglycemia, drug-induced    Hyperlipidemia    Kidney stones 2014   Left side, multiple   Leukocytosis    Myocardial infarct [facility])    Osteoporosis 2013   Thyroid disease    pt denies   Tobacco abuse     Patient Active Problem List   Diagnosis Date Noted   Secondary hyperparathyroidism ([place]) 01/25/2021   Stucco keratoses 09/27/2019   Diabetes mellitus without complication ([place]) 71/05/2693   Chronic pain 11/18/2018   CAD (coronary artery disease) 10/05/2018   Chronic diastolic CHF (congestive heart failure) (HCC)    S/P PTCA (percutaneous transluminal coronary  angioplasty)    Orthopnea    H/O non-ST elevation myocardial infarction (NSTEMI)    Primary hypertension    Abnormal EKG 85/46/2703   Diastolic dysfunction 50/08/3817   Pulmonary hypertension ([place]) 05/01/2016   Rash and nonspecific skin eruption 08/15/2014   Ureteral stone with hydronephrosis 05/30/2013   Allergic rhinitis 04/11/2013   Lumbar back pain 03/03/2013   Compression fracture of L3 lumbar vertebra 03/03/2013   Constipation 03/03/2013   Osteoporosis 03/16/2012   GERD (gastroesophageal reflux disease) 03/16/2012   Thrush 03/02/2012   Chronic hypoxemic respiratory failure ([place]) 02/27/2012   Hyperlipidemia 02/25/2012   Tobacco abuse 09/24/2011   COPD with acute exacerbation ([place]) 12/24/2009    Past Surgical History:  Procedure Laterality Date   CERVICAL BIOPSY     COLONOSCOPY N/A 03/31/2013   Procedure: COLONOSCOPY;  Surgeon: Rogene Houston, MD;  [facility];  Service: Endoscopy;  Laterality: N/A;  730   CORONARY ATHERECTOMY N/A 08/19/2018   Procedure: CORONARY ATHERECTOMY;  Surgeon: Nelva Bush, MD;  Location: [place] CV LAB;  Service: Cardiovascular;  Laterality: N/A;   CORONARY STENT INTERVENTION N/A 08/19/2018   Procedure: CORONARY STENT INTERVENTION;  Surgeon: Nelva Bush, MD;  Location: [place] CV LAB;  Service: Cardiovascular;  Laterality: N/A;   INTRAVASCULAR ULTRASOUND/IVUS N/A 08/19/2018   Procedure: Intravascular Ultrasound/IVUS;  Surgeon: Nelva Bush, MD;  Location: [place] CV LAB;  Service: Cardiovascular;  Laterality: N/A;   LEFT HEART  CATH N/A 08/19/2018   Procedure: Left Heart Cath;  Surgeon: Nelva Bush, MD;  Location: [place] CV LAB;  Service: Cardiovascular;  Laterality: N/A;   LEFT HEART CATH AND CORONARY ANGIOGRAPHY N/A 08/16/2018   Procedure: LEFT HEART CATH AND CORONARY ANGIOGRAPHY;  Surgeon: Nelva Bush, MD;  Location: [place] CV LAB;  Service: Cardiovascular;  Laterality: N/A;   TUBAL LIGATION        OB History   No obstetric history on file.     Family History  Problem Relation Age of Onset   Heart disease Mother    Hypertension Sister    Hypertension Brother     Social History   Tobacco Use   Smoking status: Former    Packs/day: 1.00    Years: 25.00    Pack years: 25.00    Types: Cigarettes   Smokeless tobacco: Never   Tobacco comments:    2-5 cigarettes smoked daily 04/02/21 [facility]   Vaping Use   Vaping Use: Never used  Substance Use Topics   Alcohol use: No   Drug use: No    Home Medications Prior to Admission medications   Medication Sig Start Date End Date Taking? Authorizing Provider  albuterol (PROVENTIL) (2.5 MG/3ML) 0.083% nebulizer solution Take 3 mLs (2.5 mg total) by nebulization every 4 (four) hours as needed for wheezing or shortness of breath. Dx: J44.9- COPD. 05/06/21  Yes Lindell Spar, MD  albuterol (VENTOLIN HFA) 108 (90 Base) MCG/ACT inhaler Inhale 2 puffs into the lungs every 4 (four) hours as needed for wheezing or shortness of breath. 05/30/21  Yes Lindell Spar, MD  alendronate (FOSAMAX) 70 MG tablet TAKE 1 TAB BY MOUTH EVERY WEEK ON AN EMPTY STOMACH. 05/16/21  Yes Lindell Spar, MD  atorvastatin (LIPITOR) 80 MG tablet TAKE 1 TABLET BY MOUTH DAILY 01/15/21  Yes Strader, [place] M, PA-C  budesonide-formoterol (SYMBICORT) 160-4.5 MCG/ACT inhaler Inhale 2 puffs into the lungs 2 (two) times daily. 05/06/21  Yes Lindell Spar, MD  clopidogrel (PLAVIX) 75 MG tablet Take 1 tablet (75 mg total) by mouth daily. 05/06/21  Yes Lindell Spar, MD  clotrimazole-betamethasone (LOTRISONE) cream APPLY TO AFFECTED AREA TWICE DAILY. Patient taking differently: Apply 1 application topically 2 (two) times daily. 11/14/19  Yes [place], Modena Nunnery, MD  doxycycline (VIBRA-TABS) 100 MG tablet Take 100 mg by mouth 2 (two) times daily. 06/25/21  Yes [provider]  ezetimibe (ZETIA) 10 MG tablet TAKE 1 TABLET(10 MG) BY MOUTH DAILY Patient taking differently:  Take 10 mg by mouth daily. 04/15/21  Yes Strader, [place], PA-C  furosemide (LASIX) 40 MG tablet ALTERNATE TAKING 2 TABLETS ONE DAY THEN 1 TABLET NEXT DAY AND REPEAT 04/18/21  Yes Lindell Spar, MD  glipiZIDE (GLUCOTROL) 10 MG tablet Take 1 tablet (10 mg total) by mouth 2 (two) times daily before a meal. 02/27/21  Yes [place], Modena Nunnery, MD  metoprolol tartrate (LOPRESSOR) 25 MG tablet Take 1 tablet (25 mg total) by mouth 2 (two) times daily. 02/13/21  Yes [place], Modena Nunnery, MD  OXYGEN Inhale 3 L into the lungs continuous.    Yes [provider]  predniSONE (STERAPRED UNI-PAK 21 TAB) 10 MG (21) TBPK tablet Take by mouth daily. Take 6 tabs by mouth daily  for 2 days, then 5 tabs for 2 days, then 4 tabs for 2 days, then 3 tabs for 2 days, 2 tabs for 2 days, then 1 tab by mouth daily for 2 days 06/21/21  Yes Wyvonnia Dusky, MD  spironolactone (ALDACTONE) 25 MG tablet Take 1 tablet (25 mg total) by mouth daily. 05/31/21  Yes Lindell Spar, MD  Tiotropium Bromide Monohydrate (SPIRIVA RESPIMAT) 2.5 MCG/ACT AERS Inhale 2 puffs into the lungs daily. 02/22/21  Yes [place], Modena Nunnery, MD  Blood Glucose Monitoring Suppl (BLOOD GLUCOSE SYSTEM PAK) KIT Please dispense based on patient and insurance preference. Use as directed to monitor FSBS 1x daily. Dx: E11.9. 09/29/19   Alycia Rossetti, MD  Calcium Carb-Cholecalciferol 600-800 MG-UNIT TABS Take 1 tablet by mouth 2 (two) times daily.  Patient not taking: Reported on 06/27/2021    [provider]  carbamide peroxide ([name]) 6.5 % OTIC solution Place 5 drops into both ears 2 (two) times daily. Patient not taking: No sig reported 03/11/21   Lindell Spar, MD  Glucose Blood (BLOOD GLUCOSE TEST STRIPS) STRP Please dispense based on patient and insurance preference. Use as directed to monitor FSBS 1x daily. Dx: E11.9. 09/29/19   Alycia Rossetti, MD  guaiFENesin-codeine 100-10 MG/5ML syrup Take 5 mLs by mouth every 6 (six) hours as needed. Patient  not taking: Reported on 06/27/2021 03/28/21   Lindell Spar, MD  hydrOXYzine (VISTARIL) 50 MG capsule Take 1 capsule (50 mg total) by mouth 3 (three) times daily as needed. Patient not taking: Reported on 06/27/2021 05/06/21   Lindell Spar, MD  ipratropium (ATROVENT) 0.03 % nasal spray Place 2 sprays into both nostrils every 12 (twelve) hours. Patient not taking: Reported on 06/27/2021 04/04/21   Lindell Spar, MD  Lancets MISC Please dispense based on patient and insurance preference. Use as directed to monitor FSBS 1x daily. Dx: E11.9. 04/18/21   Lindell Spar, MD  LINZESS 145 MCG CAPS capsule TAKE 1 CAPSULE BY MOUTH BEFORE BREAKFAST. Patient not taking: Reported on 06/27/2021 02/13/21   Alycia Rossetti, MD  mupirocin ointment [facility]) 2 % Apply to affected areas on buttocks and toe twice a day for 1 week Patient not taking: Reported on 06/27/2021 02/18/21   Alycia Rossetti, MD  nitroGLYCERIN (NITROSTAT) 0.4 MG SL tablet Place 1 tablet (0.4 mg total) under the tongue every 5 (five) minutes as needed for chest pain. If no improvement with 3 doses call 911 Patient not taking: Reported on 06/27/2021 08/27/18   Delsa Grana, PA-C  nystatin (MYCOSTATIN) 100000 UNIT/ML suspension Use as directed 5 mLs (500,000 Units total) in the mouth or throat 4 (four) times daily. Swish and swallow Patient not taking: No sig reported 03/11/21   Lindell Spar, MD  olmesartan (BENICAR) 20 MG tablet TAKE 1/2 TABLET(10 MG) BY MOUTH DAILY Patient not taking: No sig reported 04/22/21   Noreene Larsson, NP  pantoprazole (PROTONIX) 40 MG tablet TAKE 1 TABLET(40 MG) BY MOUTH TWICE DAILY Patient taking differently: Take 40 mg by mouth 2 (two) times daily. 06/04/21   Lindell Spar, MD  polyethylene glycol [facility] / Floria Raveling) packet Take 17 g by mouth daily. Patient taking differently: Take 17 g by mouth daily as needed for mild constipation or moderate constipation. 06/04/18   Varney Biles, MD  potassium chloride SA  (KLOR-CON) 20 MEQ tablet Take 1 tablet (20 mEq total) by mouth daily for 3 days. Patient not taking: No sig reported 08/28/20 06/27/21  Alycia Rossetti, MD  predniSONE (DELTASONE) 10 MG tablet Take 80m once a day Patient not taking: Reported on 06/27/2021 02/18/21   DAlycia Rossetti MD    Allergies  Keflex [cephalexin], Losartan, Sulfa antibiotics, Avelox [moxifloxacin hcl in nacl], and Toradol [ketorolac tromethamine]  Review of Systems   Review of Systems  Constitutional:  Negative for appetite change and fatigue.  HENT:  Negative for congestion, ear discharge and sinus pressure.   Eyes:  Negative for discharge.  Respiratory:  Positive for shortness of breath and wheezing. Negative for cough.   Cardiovascular:  Negative for chest pain.  Gastrointestinal:  Negative for abdominal pain and diarrhea.  Genitourinary:  Negative for frequency and hematuria.  Musculoskeletal:  Negative for back pain.  Skin:  Negative for rash.  Neurological:  Negative for seizures and headaches.  Psychiatric/Behavioral:  Negative for hallucinations.    Physical Exam Updated Vital Signs BP (!) 164/89   Pulse 92   Temp 99.3 F (37.4 C) (Oral)   Resp 15   SpO2 100%   Physical Exam Vitals reviewed.  Constitutional:      Appearance: She is well-developed.  HENT:     Head: Normocephalic.     Nose: Nose normal.  Eyes:     General: No scleral icterus.    Conjunctiva/sclera: Conjunctivae normal.  Neck:     Thyroid: No thyromegaly.  Cardiovascular:     Rate and Rhythm: Normal rate and regular rhythm.     Heart sounds: No murmur heard.   No friction rub. No gallop.  Pulmonary:     Breath sounds: No stridor. Wheezing present. No rales.  Chest:     Chest wall: No tenderness.  Abdominal:     General: There is no distension.     Tenderness: There is no abdominal tenderness. There is no rebound.  Musculoskeletal:        General: Normal range of motion.     Cervical back: Neck supple.   Lymphadenopathy:     Cervical: No cervical adenopathy.  Skin:    Findings: No erythema or rash.  Neurological:     Mental Status: She is oriented to person, place, and time.     Motor: No abnormal muscle tone.     Coordination: Coordination normal.  Psychiatric:        Behavior: Behavior normal.    ED Results / Procedures / Treatments   Labs (all labs ordered are listed, but only abnormal results are displayed) Labs Reviewed  BASIC METABOLIC PANEL - Abnormal; Notable for the following components:      Result Value   Potassium 3.3 (*)    Chloride 94 (*)    Glucose, Bld 338 (*)    BUN 24 (*)    Creatinine, Ser 1.14 (*)    GFR, Estimated 53 (*)    All other components within normal limits  CBC - Abnormal; Notable for the following components:   WBC 18.2 (*)    All other components within normal limits  RESP PANEL BY RT-PCR (FLU A&B, COVID) ARPGX2    EKG None  Radiology No results found.  Procedures Procedures   Medications Ordered in ED Medications  HYDROmorphone (DILAUDID) injection 0.5 mg (has no administration in time range)  ipratropium-albuterol (DUONEB) 0.5-2.5 (3) MG/3ML nebulizer solution 3 mL (3 mLs Nebulization Given 06/27/21 2229)  albuterol (PROVENTIL) (2.5 MG/3ML) 0.083% nebulizer solution 2.5 mg (2.5 mg Nebulization Given 06/27/21 2229)  methylPREDNISolone sodium succinate (SOLU-MEDROL) 125 mg/2 mL injection 125 mg (125 mg Intravenous Given 06/27/21 2214)    ED Course  I have reviewed the triage vital signs and the nursing notes.  Pertinent labs & imaging results that were available during my  care of the patient were reviewed by me and considered in my medical decision making (see chart for details).    MDM Rules/Calculators/A&P                          Patient with COPD exacerbation.  Patient improved with neb treatment and wanted to go home.  She will follow-up with her PCP and continue her neb treatments at home her oxygen sat was 100% on her  normal nasal Final Clinical Impression(s) / ED Diagnoses Final diagnoses:  COPD exacerbation [facility])    Rx / DC Orders ED Discharge Orders     None        Milton Ferguson, MD 06/28/21 1130

## 2021-06-28 ENCOUNTER — Ambulatory Visit (INDEPENDENT_AMBULATORY_CARE_PROVIDER_SITE_OTHER): Payer: Medicare Other | Admitting: Emergency Medicine

## 2021-06-28 ENCOUNTER — Encounter: Payer: Self-pay | Admitting: Emergency Medicine

## 2021-06-28 DIAGNOSIS — J9611 Chronic respiratory failure with hypoxia: Secondary | ICD-10-CM | POA: Diagnosis not present

## 2021-06-28 DIAGNOSIS — J411 Mucopurulent chronic bronchitis: Secondary | ICD-10-CM | POA: Diagnosis not present

## 2021-06-28 NOTE — Assessment & Plan Note (Signed)
Very labile, but she has other problems that are superimposed that contribute to her over debility - her BP, diastolic heart function, nasal congestion and obstruction. Frequent exacerbations. We discussed possibly increasing her maintenance pred, but she wants to hold off. We have also talked about rotating abx, but she wants to avoid. She is on frequent abx as it is.  For now will continue her usual regimen, maintenance pred 10mg 

## 2021-06-28 NOTE — Assessment & Plan Note (Signed)
Continue her O2 at 3L/min.

## 2021-06-28 NOTE — Progress Notes (Signed)
Virtual Visit via Telephone Note  I connected with Cindy [name] on 06/28/21 at 11:45 AM EDT by telephone and verified that I am speaking with the correct person using two identifiers.  Location: Patient: Home Provider: Office   I discussed the limitations, risks, security and privacy concerns of performing an evaluation and management service by telephone and the availability of in person appointments. I also discussed with the patient that there may be a patient responsible charge related to this service. The patient expressed understanding and agreed to proceed.   History of Present Illness: 65 year old woman whom I have followed for severe COPD with chronic bronchitic phenotype, frequent exacerbations.  She still smokes.  Have been managing her on scheduled prednisone, currently 10 mg daily.  She has associated hypoxemic respiratory failure 3 L/min.  We last had a phone visit in April 2022.   Observations/Objective: She was treated for an acute exacerbation 5/15, 6/24, then seen again in the ER 6/30. Most recently she was seen there for dyspnea, edema, generalized pain.  Her current prednisone dose is 30mg  tapering to her usual 10mg . Also currently on doxycycline Remains on Symbicort and Spiriva respimat. Using albuterol nebs bid, HFA about 1x a day.   She has been having labile hypertension, tried olmesartan, but currently off sue to rash  Assessment and Plan: COPD (chronic obstructive pulmonary disease) ([place]) Very labile, but she has other problems that are superimposed that contribute to her over debility - her BP, diastolic heart function, nasal congestion and obstruction. Frequent exacerbations. We discussed possibly increasing her maintenance pred, but she wants to hold off. We have also talked about rotating abx, but she wants to avoid. She is on frequent abx as it is.  For now will continue her usual regimen, maintenance pred 10mg    Chronic hypoxemic respiratory failure  ([place]) Continue her O2 at 3L/min.    Follow Up Instructions: 2 months phone visit   I discussed the assessment and treatment plan with the patient. The patient was provided an opportunity to ask questions and all were answered. The patient agreed with the plan and demonstrated an understanding of the instructions.   The patient was advised to call back or seek an in-person evaluation if the symptoms worsen or if the condition fails to improve as anticipated.  I provided 15 minutes of non-face-to-face time during this encounter.   Collene Gobble, MD

## 2021-06-30 ENCOUNTER — Other Ambulatory Visit: Payer: Self-pay | Admitting: Emergency Medicine

## 2021-07-04 ENCOUNTER — Other Ambulatory Visit: Payer: Self-pay | Admitting: Family Medicine

## 2021-07-06 ENCOUNTER — Other Ambulatory Visit: Payer: Self-pay | Admitting: Family Medicine

## 2021-07-09 ENCOUNTER — Ambulatory Visit: Payer: Medicare Other | Admitting: Internal Medicine

## 2021-08-03 ENCOUNTER — Other Ambulatory Visit: Payer: Self-pay | Admitting: Family Medicine

## 2021-08-03 DIAGNOSIS — B370 Candidal stomatitis: Secondary | ICD-10-CM

## 2021-08-05 ENCOUNTER — Other Ambulatory Visit: Payer: Self-pay | Admitting: Internal Medicine

## 2021-08-05 DIAGNOSIS — M8080XD Other osteoporosis with current pathological fracture, unspecified site, subsequent encounter for fracture with routine healing: Secondary | ICD-10-CM

## 2021-08-13 NOTE — Progress Notes (Signed)
Virtual Visit via Video Note   This visit type was conducted due to national recommendations for restrictions regarding the COVID-19 Pandemic (e.g. social distancing) in an effort to limit this patient's exposure and mitigate transmission in our community.  Due to her co-morbid illnesses, this patient is at least at moderate risk for complications without adequate follow up.  This format is felt to be most appropriate for this patient at this time.  All issues noted in this document were discussed and addressed.  A limited physical exam was performed with this format.  Please refer to the patient's chart for her consent to telehealth for [facility].   Patient location : Home Physician location : Office Primary Physician: Pinal Nation, MD Primary Cardiologist:  Johnsie Cancel Reason for Consultation: CAD/Dyspnea/AS    HPI:  65 y.o. with history of oxygen dependent COPD, HTN, HLD, smoking and CAD. SEMI 08/11/18 Echo with  EF 55% mild AS. Cath with complex calcified ostial/proximal LAD stenosis stented with atherectomy and IVUS Residual 60-70% OM2 disease She has kidney stones being followed by Dr Eulogio Ditch CT 01/31/21 with multiple on obstructing renal calculi no hydronephrosis   No angina Chronic dyspnea from COPD   BP elevated she did not think Norvasc strong enough Told to f/u with primary  And offered to change to Cozaar she deferred   Severe COPD with frequent exacerbations followed by Dr Lamonte Sakai On 3 L's oxygen and steroids. Seen in ED 06/27/21 with exacerbation Rx nebs and d/c home CXR NAD chronic hyperinflation and emphysema   Have some nasal issues with chronic oxygen humidifier not working Some arthritis   ROS All other systems reviewed and negative except as noted above  Past Medical History:  Diagnosis Date   Adrenal adenoma 2014   Left   Arthritis    CAD (coronary artery disease)    a. NSTEMI 07/2018 s/p difficult PCI with overlapping DES to LAD, residual OM2 disease  treated medically.   Chronic diastolic CHF (congestive heart failure) (HCC)    Chronic pain    Chronic respiratory failure (HCC) On home 02 2-3L   Compression fracture of lumbar vertebra (HCC)    COPD (chronic obstructive pulmonary disease) (HCC)    Essential hypertension    Hyperglycemia, drug-induced    Hyperlipidemia    Kidney stones 2014   Left side, multiple   Leukocytosis    Myocardial infarct ([place])    Osteoporosis 2013   Thyroid disease    pt denies   Tobacco abuse     Family History  Problem Relation Age of Onset   Heart disease Mother    Hypertension Sister    Hypertension Brother     Social History   Socioeconomic History   Marital status: Divorced    Spouse name: Not on file   Number of children: 3   Years of education: Not on file   Highest education level: Not on file  Occupational History   Occupation: disabled    Employer: DISABLED  Tobacco Use   Smoking status: Some Days    Packs/day: 1.00    Years: 25.00    Pack years: 25.00    Types: Cigarettes   Smokeless tobacco: Never   Tobacco comments:    2-5 cigarettes smoked daily 04/02/21 [facility]   Vaping Use   Vaping Use: Never used  Substance and Sexual Activity   Alcohol use: No   Drug use: No   Sexual activity: Not Currently    Birth control/protection: Surgical  Other Topics Concern   Not on file  Social History Narrative   Not on file   Social Determinants of Health   Financial Resource Strain: Not on file  Food Insecurity: Not on file  Transportation Needs: Not on file  Physical Activity: Not on file  Stress: Not on file  Social Connections: Not on file  Intimate Partner Violence: Not on file    Past Surgical History:  Procedure Laterality Date   CERVICAL BIOPSY     COLONOSCOPY N/A 03/31/2013   Procedure: COLONOSCOPY;  Surgeon: Rogene Houston, MD;  Location: [facility];  Service: Endoscopy;  Laterality: N/A;  730   CORONARY ATHERECTOMY N/A 08/19/2018   Procedure: CORONARY ATHERECTOMY;   Surgeon: Nelva Bush, MD;  Location: [place] [facility];  Service: Cardiovascular;  Laterality: N/A;   CORONARY STENT INTERVENTION N/A 08/19/2018   Procedure: CORONARY STENT INTERVENTION;  Surgeon: Nelva Bush, MD;  Location: [place] [facility];  Service: Cardiovascular;  Laterality: N/A;   INTRAVASCULAR ULTRASOUND/IVUS N/A 08/19/2018   Procedure: Intravascular Ultrasound/IVUS;  Surgeon: Nelva Bush, MD;  Location: [place] [facility];  Service: Cardiovascular;  Laterality: N/A;   LEFT HEART CATH N/A 08/19/2018   Procedure: Left Heart Cath;  Surgeon: Nelva Bush, MD;  Location: [place] [facility];  Service: Cardiovascular;  Laterality: N/A;   LEFT HEART CATH AND CORONARY ANGIOGRAPHY N/A 08/16/2018   Procedure: LEFT HEART CATH AND CORONARY ANGIOGRAPHY;  Surgeon: Nelva Bush, MD;  Location: [place] [facility];  Service: Cardiovascular;  Laterality: N/A;   TUBAL LIGATION        Current Outpatient Medications:    albuterol (PROVENTIL) (2.5 MG/3ML) 0.083% nebulizer solution, Take 3 mLs (2.5 mg total) by nebulization every 4 (four) hours as needed for wheezing or shortness of breath. Dx: J44.9- COPD., Disp: 525 mL, Rfl: 0   albuterol (VENTOLIN HFA) 108 (90 Base) MCG/ACT inhaler, Inhale 2 puffs into the lungs every 4 (four) hours as needed for wheezing or shortness of breath., Disp: 1 each, Rfl: 1   alendronate (FOSAMAX) 70 MG tablet, TAKE 1 TAB BY MOUTH EVERY WEEK ON AN EMPTY STOMACH., Disp: 12 tablet, Rfl: 0   atorvastatin (LIPITOR) 80 MG tablet, TAKE 1 TABLET BY MOUTH DAILY, Disp: 90 tablet, Rfl: 0   Blood Glucose Monitoring Suppl (BLOOD GLUCOSE SYSTEM PAK) KIT, Please dispense based on patient and insurance preference. Use as directed to monitor FSBS 1x daily. Dx: E11.9., Disp: 1 kit, Rfl: 1   budesonide-formoterol (SYMBICORT) 160-4.5 MCG/ACT inhaler, Inhale 2 puffs into the lungs 2 (two) times daily., Disp: 30.6 g, Rfl: 0   Calcium Carb-Cholecalciferol 600-800 MG-UNIT TABS,  Take 1 tablet by mouth 2 (two) times daily., Disp: , Rfl:    clopidogrel (PLAVIX) 75 MG tablet, Take 1 tablet (75 mg total) by mouth daily., Disp: 90 tablet, Rfl: 0   doxycycline (VIBRA-TABS) 100 MG tablet, Take 100 mg by mouth 2 (two) times daily., Disp: , Rfl:    ezetimibe (ZETIA) 10 MG tablet, TAKE 1 TABLET(10 MG) BY MOUTH DAILY, Disp: 90 tablet, Rfl: 2   furosemide (LASIX) 40 MG tablet, ALTERNATE TAKING 2 TABLETS ONE DAY THEN 1 TABLET NEXT DAY AND REPEAT, Disp: 142 tablet, Rfl: 0   glipiZIDE (GLUCOTROL) 10 MG tablet, Take 1 tablet (10 mg total) by mouth 2 (two) times daily before a meal., Disp: 60 tablet, Rfl: 3   Glucose Blood (BLOOD GLUCOSE TEST STRIPS) STRP, Please dispense based on patient and insurance preference. Use as directed to  monitor FSBS 1x daily. Dx: E11.9., Disp: 100 strip, Rfl: 1   ipratropium (ATROVENT) 0.03 % nasal spray, Place 2 sprays into both nostrils every 12 (twelve) hours., Disp: 30 mL, Rfl: 12   Lancets MISC, Please dispense based on patient and insurance preference. Use as directed to monitor FSBS 1x daily. Dx: E11.9., Disp: 100 each, Rfl: 11   LINZESS 145 MCG CAPS capsule, TAKE 1 CAPSULE BY MOUTH BEFORE BREAKFAST., Disp: 30 capsule, Rfl: 0   nitroGLYCERIN (NITROSTAT) 0.4 MG SL tablet, Place 1 tablet (0.4 mg total) under the tongue every 5 (five) minutes as needed for chest pain. If no improvement with 3 doses call 911, Disp: 50 tablet, Rfl: 3   OXYGEN, Inhale 3 L into the lungs continuous. , Disp: , Rfl:    pantoprazole (PROTONIX) 40 MG tablet, TAKE 1 TABLET(40 MG) BY MOUTH TWICE DAILY (Patient taking differently: Take 40 mg by mouth 2 (two) times daily.), Disp: 180 tablet, Rfl: 3   polyethylene glycol (MIRALAX / GLYCOLAX) packet, Take 17 g by mouth daily., Disp: 14 each, Rfl: 0   predniSONE (DELTASONE) 10 MG tablet, Take 44m once a day, Disp: 30 tablet, Rfl: 0   spironolactone (ALDACTONE) 25 MG tablet, Take 1 tablet (25 mg total) by mouth daily., Disp: 90 tablet, Rfl:  3   Tiotropium Bromide Monohydrate (SPIRIVA RESPIMAT) 2.5 MCG/ACT AERS, Inhale 2 puffs into the lungs daily., Disp: 12 g, Rfl: 11   carbamide peroxide ([name]) 6.5 % OTIC solution, Place 5 drops into both ears 2 (two) times daily. (Patient not taking: Reported on 08/19/2021), Disp: 15 mL, Rfl: 0   clotrimazole-betamethasone (LOTRISONE) cream, APPLY TO AFFECTED AREA TWICE DAILY. (Patient not taking: Reported on 08/19/2021), Disp: 45 g, Rfl: 0   guaiFENesin-codeine 100-10 MG/5ML syrup, Take 5 mLs by mouth every 6 (six) hours as needed. (Patient not taking: Reported on 08/19/2021), Disp: 180 mL, Rfl: 0   hydrOXYzine (VISTARIL) 50 MG capsule, Take 1 capsule (50 mg total) by mouth 3 (three) times daily as needed. (Patient not taking: Reported on 08/19/2021), Disp: 90 capsule, Rfl: 0   metoprolol tartrate (LOPRESSOR) 25 MG tablet, Take 1 tablet (25 mg total) by mouth 2 (two) times daily. (Patient not taking: Reported on 08/19/2021), Disp: 180 tablet, Rfl: 3   mupirocin ointment (BACTROBAN) 2 %, Apply to affected areas on buttocks and toe twice a day for 1 week (Patient not taking: Reported on 08/19/2021), Disp: 22 g, Rfl: 0   nystatin (MYCOSTATIN) 100000 UNIT/ML suspension, Use as directed 5 mLs (500,000 Units total) in the mouth or throat 4 (four) times daily. Swish and swallow (Patient not taking: Reported on 08/19/2021), Disp: 60 mL, Rfl: 0   olmesartan (BENICAR) 20 MG tablet, TAKE 1/2 TABLET(10 MG) BY MOUTH DAILY (Patient not taking: Reported on 08/19/2021), Disp: 45 tablet, Rfl: 1   potassium chloride SA (KLOR-CON) 20 MEQ tablet, Take 1 tablet (20 mEq total) by mouth daily for 3 days. (Patient not taking: No sig reported), Disp: 3 tablet, Rfl: 0   predniSONE (STERAPRED UNI-PAK 21 TAB) 10 MG (21) TBPK tablet, Take by mouth daily. Take 6 tabs by mouth daily  for 2 days, then 5 tabs for 2 days, then 4 tabs for 2 days, then 3 tabs for 2 days, 2 tabs for 2 days, then 1 tab by mouth daily for 2 days (Patient not taking:  Reported on 08/19/2021), Disp: 42 tablet, Rfl: 0    Physical Exam: Height 5' 2"  (1.575 m), weight 85.7 kg.  Telephone no exam    Labs:   Lab Results  Component Value Date   WBC 18.2 (H) 06/27/2021   HGB 12.5 06/27/2021   HCT 38.9 06/27/2021   MCV 96.3 06/27/2021   PLT 326 06/27/2021   No results for input(s): NA, K, CL, CO2, BUN, CREATININE, CALCIUM, PROT, BILITOT, ALKPHOS, ALT, AST, GLUCOSE in the last 168 hours.  Invalid input(s): LABALBU Lab Results  Component Value Date   TROPONINI <0.03 02/24/2019    Lab Results  Component Value Date   CHOL 153 08/24/2020   CHOL 141 06/15/2020   CHOL 139 03/06/2020   Lab Results  Component Value Date   HDL 46 (L) 08/24/2020   HDL 40 (L) 06/15/2020   HDL 40 (L) 03/06/2020   Lab Results  Component Value Date   LDLCALC 76 08/24/2020   LDLCALC 70 06/15/2020   LDLCALC 72 03/06/2020   Lab Results  Component Value Date   TRIG 215 (H) 08/24/2020   TRIG 225 (H) 06/15/2020   TRIG 196 (H) 03/06/2020   Lab Results  Component Value Date   CHOLHDL 3.3 08/24/2020   CHOLHDL 3.5 06/15/2020   CHOLHDL 3.5 03/06/2020   No results found for: LDLDIRECT    Radiology: No results found.   EKG: No ECG    ASSESSMENT AND PLAN:   1. CAD: SEMI August 2019 with preserved EF. DAT continues due to complexity of lesion Residual OM2 disease Activity limited by COPD no angina continue medical Rx  2. COPD:  Encouraged smoking cessation Should have lung cancer screening CT per primary Prednisone Rx makes BP control harder on 3L [place] followed by Dr Lamonte Sakai  3. HTN:  Well controlled.  Continue current medications and low sodium Dash type diet.   4. HLD on statin and zetia LDL 76 08/24/20  5. Kidney Stones:  Non obstructing f/u with Dr Valetta Fuller   Time Spent reviewing chart previous angiogram direct patient interview and composing note 25 minutes   F/U in 6 months in person   Signed: Jenkins Rouge 08/19/2021, 9:39 AM

## 2021-08-16 ENCOUNTER — Telehealth: Payer: Self-pay | Admitting: Cardiovascular Disease

## 2021-08-16 NOTE — Telephone Encounter (Signed)
  Patient Consent for Virtual Visit        Cindy [name] has provided verbal consent on 08/16/2021 for a virtual visit (video or telephone).   CONSENT FOR VIRTUAL VISIT FOR:  Cindy [name]  By participating in this virtual visit I agree to the following:  I hereby voluntarily request, consent and authorize [facility] and its employed or contracted physicians, physician assistants, nurse practitioners or other licensed health care professionals (the Practitioner), to provide me with telemedicine health care services (the "Services") as deemed necessary by the treating Practitioner. I acknowledge and consent to receive the Services by the Practitioner via telemedicine. I understand that the telemedicine visit will involve communicating with the Practitioner through live audiovisual communication technology and the disclosure of certain medical information by electronic transmission. I acknowledge that I have been given the opportunity to request an in-person assessment or other available alternative prior to the telemedicine visit and am voluntarily participating in the telemedicine visit.  I understand that I have the right to withhold or withdraw my consent to the use of telemedicine in the course of my care at any time, without affecting my right to future care or treatment, and that the Practitioner or I may terminate the telemedicine visit at any time. I understand that I have the right to inspect all information obtained and/or recorded in the course of the telemedicine visit and may receive copies of available information for a reasonable fee.  I understand that some of the potential risks of receiving the Services via telemedicine include:  Delay or interruption in medical evaluation due to technological equipment failure or disruption; Information transmitted may not be sufficient (e.g. poor resolution of images) to allow for appropriate medical decision making by the Practitioner;  and/or  In rare instances, security protocols could fail, causing a breach of personal health information.  Furthermore, I acknowledge that it is my responsibility to provide information about my medical history, conditions and care that is complete and accurate to the best of my ability. I acknowledge that Practitioner's advice, recommendations, and/or decision may be based on factors not within their control, such as incomplete or inaccurate data provided by me or distortions of diagnostic images or specimens that may result from electronic transmissions. I understand that the practice of medicine is not an exact science and that Practitioner makes no warranties or guarantees regarding treatment outcomes. I acknowledge that a copy of this consent can be made available to me via my patient portal ([facility]), or I can request a printed copy by calling the office of [facility].    I understand that my insurance will be billed for this visit.   I have read or had this consent read to me. I understand the contents of this consent, which adequately explains the benefits and risks of the Services being provided via telemedicine.  I have been provided ample opportunity to ask questions regarding this consent and the Services and have had my questions answered to my satisfaction. I give my informed consent for the services to be provided through the use of telemedicine in my medical care

## 2021-08-16 NOTE — Telephone Encounter (Signed)
New message    Patient wants to know what this appointment is for?  She is not taking the medication he gave her, and she does not want to come into the office she wants a virtual appointment only.

## 2021-08-16 NOTE — Telephone Encounter (Signed)
Pt's appt switched to VV from In-Person.

## 2021-08-19 ENCOUNTER — Telehealth (INDEPENDENT_AMBULATORY_CARE_PROVIDER_SITE_OTHER): Payer: Medicare Other | Admitting: Cardiovascular Disease

## 2021-08-19 ENCOUNTER — Other Ambulatory Visit: Payer: Self-pay

## 2021-08-19 ENCOUNTER — Encounter: Payer: Self-pay | Admitting: Cardiovascular Disease

## 2021-08-19 VITALS — Ht 62.0 in | Wt 189.0 lb

## 2021-08-19 DIAGNOSIS — N200 Calculus of kidney: Secondary | ICD-10-CM

## 2021-08-19 DIAGNOSIS — E782 Mixed hyperlipidemia: Secondary | ICD-10-CM

## 2021-08-19 DIAGNOSIS — J449 Chronic obstructive pulmonary disease, unspecified: Secondary | ICD-10-CM

## 2021-08-19 DIAGNOSIS — I2510 Atherosclerotic heart disease of native coronary artery without angina pectoris: Secondary | ICD-10-CM | POA: Diagnosis not present

## 2021-08-19 DIAGNOSIS — I10 Essential (primary) hypertension: Secondary | ICD-10-CM | POA: Diagnosis not present

## 2021-08-19 NOTE — Patient Instructions (Signed)
Medication Instructions:  Your physician recommends that you continue on your current medications as directed. Please refer to the Current Medication list given to you today.  *If you need a refill on your cardiac medications before your next appointment, please call your pharmacy*   Lab Work: NONE  If you have labs (blood work) drawn today and your tests are completely normal, you will receive your results only by: MyChart Message (if you have MyChart) OR A paper copy in the mail If you have any lab test that is abnormal or we need to change your treatment, we will call you to review the results.   Testing/Procedures: NONE    Follow-Up: At [facility], you and your health needs are our priority.  As part of our continuing mission to provide you with exceptional heart care, we have created designated Provider Care Teams.  These Care Teams include your primary Cardiologist (physician) and Advanced Practice Providers (APPs -  Physician Assistants and Nurse Practitioners) who all work together to provide you with the care you need, when you need it.  We recommend signing up for the patient portal called "MyChart".  Sign up information is provided on this After Visit Summary.  MyChart is used to connect with patients for Virtual Visits (Telemedicine).  Patients are able to view lab/test results, encounter notes, upcoming appointments, etc.  Non-urgent messages can be sent to your provider as well.   To learn more about what you can do with MyChart, go to [email or web address].    Your next appointment:   6 month(s)  The format for your next appointment:   In Person  Provider:   Peter Nishan, MD   Other Instructions Thank you for choosing [facility]!    

## 2021-09-03 ENCOUNTER — Telehealth: Payer: Self-pay | Admitting: Emergency Medicine

## 2021-09-03 ENCOUNTER — Telehealth (INDEPENDENT_AMBULATORY_CARE_PROVIDER_SITE_OTHER): Payer: Medicare Other | Admitting: Emergency Medicine

## 2021-09-03 ENCOUNTER — Other Ambulatory Visit: Payer: Self-pay

## 2021-09-03 ENCOUNTER — Encounter: Payer: Self-pay | Admitting: Emergency Medicine

## 2021-09-03 DIAGNOSIS — Z720 Tobacco use: Secondary | ICD-10-CM

## 2021-09-03 DIAGNOSIS — J9611 Chronic respiratory failure with hypoxia: Secondary | ICD-10-CM | POA: Diagnosis not present

## 2021-09-03 DIAGNOSIS — J411 Mucopurulent chronic bronchitis: Secondary | ICD-10-CM | POA: Diagnosis not present

## 2021-09-03 DIAGNOSIS — J309 Allergic rhinitis, unspecified: Secondary | ICD-10-CM | POA: Diagnosis not present

## 2021-09-03 MED ORDER — DOXYCYCLINE HYCLATE 100 MG PO TABS: 100.0000 mg | ORAL_TABLET | Freq: Two times a day (BID) | ORAL | 0 refills | Status: DC

## 2021-09-03 MED ORDER — PREDNISONE 10 MG PO TABS: ORAL_TABLET | ORAL | 0 refills | Status: DC

## 2021-09-03 MED ORDER — PREDNISONE 20 MG PO TABS
20.0000 mg | ORAL_TABLET | Freq: Every day | ORAL | 0 refills | Status: DC
Start: 2021-09-03 — End: 2021-10-22

## 2021-09-03 NOTE — Assessment & Plan Note (Signed)
Continue 3L/min 

## 2021-09-03 NOTE — Telephone Encounter (Signed)
Called and spoke with patient. She stated that she had a televisit with RB this morning and he mentioned a prednisone taper. She went to [place] and received 5 prednisone '20mg'$  tablets. Per the patient, this will not be enough.   I reviewed her OV and did not see any mention about a prednisone taper.   RB, can you please advise? Thanks!

## 2021-09-03 NOTE — Assessment & Plan Note (Signed)
Continued tobacco use, has not been able to stop.  Not motivated to stop.

## 2021-09-03 NOTE — Telephone Encounter (Signed)
She specifically asked for Pred '20mg'$ .   I would prefer this >> Take '40mg'$  daily for 3 days, then '30mg'$  daily for 3 days, then '20mg'$  daily for 3 days, then '10mg'$  daily going forward as per her current regimen

## 2021-09-03 NOTE — Assessment & Plan Note (Addendum)
Sounds fairly stable with regard to cough, wheeze. She is more SOB but mainly due to nasal obstruction. Plan to continue her current BD regimen. Treat flaring nasal obstruction as below. She needs flu shot and PNA vaccine. Will work on trying to arrange here.  Continue her chronic prednisone 10 mg once daily.

## 2021-09-03 NOTE — Assessment & Plan Note (Signed)
Symptoms, nasal obstruction.  She cannot tolerate nasal steroid due to involving sores, nasal irritation.  Not on ipratropium nasal spray anymore.  She is using over-the-counter nasal spray as well as nasal saline.  I treated her with a brief course of prednisone, doxycycline.

## 2021-09-03 NOTE — Telephone Encounter (Signed)
I spoke with the pt and notified of response per Dr Lamonte Sakai  New rx for pred sent to pharm

## 2021-09-03 NOTE — Addendum Note (Signed)
Addended by: Lorretta Harp on: 09/03/2021 09:30 AM   Modules accepted: Orders

## 2021-09-03 NOTE — Progress Notes (Signed)
Virtual Visit via Telephone Note  I connected with Cindy [name] on 09/03/21 at  9:00 AM EDT by telephone and verified that I am speaking with the correct person using two identifiers.  Location: Patient: Home Provider: Office   I discussed the limitations, risks, security and privacy concerns of performing an evaluation and management service by telephone and the availability of in person appointments. I also discussed with the patient that there may be a patient responsible charge related to this service. The patient expressed understanding and agreed to proceed.   History of Present Illness: 65 year old active smoker, long smoking history who is followed for severe COPD with a chronic bronchitic phenotype, frequent exacerbations requiring intermittent pulses of prednisone and also antibiotics.  She has associated hypoxemic respiratory failure and is using oxygen at 3 L/min.  Also with significant chronic rhinitis.  She has hypertension and CAD and follows with Dr. Johnsie Cancel.    Observations/Objective: She is c/o increased nasal congestion - has been worse over the last 2 days. Difficult moving any air through her nose, making her SOB much worse. Has tried using nasal saline. Also OTC nasal sprays, has used some Afrin. More exertional SOB last 3-4 days. No real change in cough.   Assessment and Plan: COPD (chronic obstructive pulmonary disease) ([place]) Sounds fairly stable with regard to cough, wheeze. She is more SOB but mainly due to nasal obstruction. Plan to continue her current BD regimen. Treat flaring nasal obstruction as below. She needs flu shot and PNA vaccine. Will work on trying to arrange here.  Continue her chronic prednisone 10 mg once daily.  Chronic hypoxemic respiratory failure (HCC) Continue 3L/min  Allergic rhinitis Symptoms, nasal obstruction.  She cannot tolerate nasal steroid due to involving sores, nasal irritation.  Not on ipratropium nasal spray anymore.  She is using  over-the-counter nasal spray as well as nasal saline.  I treated her with a brief course of prednisone, doxycycline.  Tobacco abuse Continued tobacco use, has not been able to stop.  Not motivated to stop.   Follow Up Instructions: Follow with Dr. Lamonte Sakai in 2 months or sooner if you have any problems.     I discussed the assessment and treatment plan with the patient. The patient was provided an opportunity to ask questions and all were answered. The patient agreed with the plan and demonstrated an understanding of the instructions.   The patient was advised to call back or seek an in-person evaluation if the symptoms worsen or if the condition fails to improve as anticipated.  I provided 22 minutes of non-face-to-face time during this encounter.   Collene Gobble, MD

## 2021-09-04 ENCOUNTER — Emergency Department (HOSPITAL_COMMUNITY): Payer: Medicare Other

## 2021-09-04 ENCOUNTER — Encounter (HOSPITAL_COMMUNITY): Payer: Self-pay

## 2021-09-04 ENCOUNTER — Emergency Department (HOSPITAL_COMMUNITY)
Admission: EM | Admit: 2021-09-04 | Discharge: 2021-09-05 | Disposition: A | Discharge status: Home / Self Care | Payer: Medicare Other | Attending: Emergency Medicine | Admitting: Emergency Medicine

## 2021-09-04 ENCOUNTER — Other Ambulatory Visit: Payer: Self-pay

## 2021-09-04 DIAGNOSIS — E119 Type 2 diabetes mellitus without complications: Secondary | ICD-10-CM | POA: Diagnosis not present

## 2021-09-04 DIAGNOSIS — I110 Hypertensive heart disease with heart failure: Secondary | ICD-10-CM | POA: Diagnosis not present

## 2021-09-04 DIAGNOSIS — I5032 Chronic diastolic (congestive) heart failure: Secondary | ICD-10-CM | POA: Diagnosis not present

## 2021-09-04 DIAGNOSIS — R0602 Shortness of breath: Secondary | ICD-10-CM | POA: Diagnosis not present

## 2021-09-04 DIAGNOSIS — Z7902 Long term (current) use of antithrombotics/antiplatelets: Secondary | ICD-10-CM | POA: Insufficient documentation

## 2021-09-04 DIAGNOSIS — I2510 Atherosclerotic heart disease of native coronary artery without angina pectoris: Secondary | ICD-10-CM | POA: Diagnosis not present

## 2021-09-04 DIAGNOSIS — J441 Chronic obstructive pulmonary disease with (acute) exacerbation: Secondary | ICD-10-CM | POA: Insufficient documentation

## 2021-09-04 DIAGNOSIS — Z7984 Long term (current) use of oral hypoglycemic drugs: Secondary | ICD-10-CM | POA: Diagnosis not present

## 2021-09-04 DIAGNOSIS — Z20822 Contact with and (suspected) exposure to covid-19: Secondary | ICD-10-CM | POA: Insufficient documentation

## 2021-09-04 DIAGNOSIS — F17210 Nicotine dependence, cigarettes, uncomplicated: Secondary | ICD-10-CM | POA: Diagnosis not present

## 2021-09-04 DIAGNOSIS — R9431 Abnormal electrocardiogram [ECG] [EKG]: Secondary | ICD-10-CM | POA: Diagnosis not present

## 2021-09-04 DIAGNOSIS — R600 Localized edema: Secondary | ICD-10-CM | POA: Insufficient documentation

## 2021-09-04 DIAGNOSIS — Z79899 Other long term (current) drug therapy: Secondary | ICD-10-CM | POA: Diagnosis not present

## 2021-09-04 DIAGNOSIS — Z7951 Long term (current) use of inhaled steroids: Secondary | ICD-10-CM | POA: Insufficient documentation

## 2021-09-04 MED ORDER — METHYLPREDNISOLONE SODIUM SUCC 125 MG IJ SOLR
125.0000 mg | Freq: Once | INTRAMUSCULAR | Status: AC
  Administered 2021-09-04: 125 mg via INTRAVENOUS
  Filled 2021-09-04: qty 2

## 2021-09-04 MED ORDER — MORPHINE SULFATE (PF) 4 MG/ML IV SOLN
4.0000 mg | Freq: Once | INTRAVENOUS | Status: AC
Start: 2021-09-04 — End: 2021-09-05
  Administered 2021-09-05: 4 mg via INTRAVENOUS
  Filled 2021-09-04: qty 1

## 2021-09-04 MED ORDER — ALBUTEROL SULFATE HFA 108 (90 BASE) MCG/ACT IN AERS
2.0000 | INHALATION_SPRAY | Freq: Once | RESPIRATORY_TRACT | Status: AC
  Administered 2021-09-05: 2 via RESPIRATORY_TRACT
  Filled 2021-09-04: qty 6.7

## 2021-09-04 NOTE — ED Provider Notes (Signed)
[facility] EMERGENCY DEPARTMENT Provider Note   CSN: [id number] Arrival date & time: 09/04/21  2020     History Chief Complaint  Patient presents with   Nasal Congestion    Cindy [name] is a 65 y.o. female.  Patient is a 65 year old female with extensive past medical history including coronary artery disease with stent, congestive heart failure, COPD, hyperlipidemia, obesity.  Patient is on home oxygen 3 L by nasal cannula.  Patient presenting with a 3-day history of nasal congestion, shortness of breath, and wheezing.  She states that she cannot breathe through her nose and has drainage down her throat.  She describes increased shortness of breath.  The symptoms have been unrelieved by doxycycline and prednisone which were prescribed by televisit by her primary doctor.  She denies chest pain or fever.  The history is provided by the patient.      Past Medical History:  Diagnosis Date   Adrenal adenoma 2014   Left   Arthritis    CAD (coronary artery disease)    a. NSTEMI 07/2018 s/p difficult PCI with overlapping DES to LAD, residual OM2 disease treated medically.   Chronic diastolic CHF (congestive heart failure) (HCC)    Chronic pain    Chronic respiratory failure (HCC) On home 02 2-3L   Compression fracture of lumbar vertebra (HCC)    COPD (chronic obstructive pulmonary disease) ([place] )    Essential hypertension    Hyperglycemia, drug-induced    Hyperlipidemia    Kidney stones 2014   Left side, multiple   Leukocytosis    Myocardial infarct ([place])    Osteoporosis 2013   Thyroid disease    pt denies   Tobacco abuse     Patient Active Problem List   Diagnosis Date Noted   Secondary hyperparathyroidism ([place]) 01/25/2021   Stucco keratoses 09/27/2019   Diabetes mellitus without complication ([place]) 88/10/314   Chronic pain 11/18/2018   CAD (coronary artery disease) 10/05/2018   Chronic diastolic CHF (congestive heart failure) (HCC)    S/P PTCA (percutaneous  transluminal coronary angioplasty)    Orthopnea    H/O non-ST elevation myocardial infarction (NSTEMI)    Primary hypertension    Abnormal EKG 94/58/5929   Diastolic dysfunction 24/46/2863   Pulmonary hypertension ([place]) 05/01/2016   Rash and nonspecific skin eruption 08/15/2014   Ureteral stone with hydronephrosis 05/30/2013   Allergic rhinitis 04/11/2013   Lumbar back pain 03/03/2013   Compression fracture of L3 lumbar vertebra 03/03/2013   Constipation 03/03/2013   Osteoporosis 03/16/2012   GERD (gastroesophageal reflux disease) 03/16/2012   Thrush 03/02/2012   Chronic hypoxemic respiratory failure ([place]) 02/27/2012   Hyperlipidemia 02/25/2012   Tobacco abuse 09/24/2011   COPD (chronic obstructive pulmonary disease) ([place]) 12/24/2009    Past Surgical History:  Procedure Laterality Date   CERVICAL BIOPSY     COLONOSCOPY N/A 03/31/2013   Procedure: COLONOSCOPY;  Surgeon: Rogene Houston, MD;  [facility];  Service: Endoscopy;  Laterality: N/A;  730   CORONARY ATHERECTOMY N/A 08/19/2018   Procedure: CORONARY ATHERECTOMY;  Surgeon: Nelva Bush, MD;  Location: [place] CV LAB;  Service: Cardiovascular;  Laterality: N/A;   CORONARY STENT INTERVENTION N/A 08/19/2018   Procedure: CORONARY STENT INTERVENTION;  Surgeon: Nelva Bush, MD;  Location: [place] CV LAB;  Service: Cardiovascular;  Laterality: N/A;   INTRAVASCULAR ULTRASOUND/IVUS N/A 08/19/2018   Procedure: Intravascular Ultrasound/IVUS;  Surgeon: Nelva Bush, MD;  Location: [place] CV LAB;  Service: Cardiovascular;  Laterality: N/A;  LEFT HEART CATH N/A 08/19/2018   Procedure: Left Heart Cath;  Surgeon: Nelva Bush, MD;  Location: [place] CV LAB;  Service: Cardiovascular;  Laterality: N/A;   LEFT HEART CATH AND CORONARY ANGIOGRAPHY N/A 08/16/2018   Procedure: LEFT HEART CATH AND CORONARY ANGIOGRAPHY;  Surgeon: Nelva Bush, MD;  Location: [place] CV LAB;  Service: Cardiovascular;   Laterality: N/A;   TUBAL LIGATION       OB History   No obstetric history on file.     Family History  Problem Relation Age of Onset   Heart disease Mother    Hypertension Sister    Hypertension Brother     Social History   Tobacco Use   Smoking status: Some Days    Packs/day: 1.00    Years: 25.00    Pack years: 25.00    Types: Cigarettes   Smokeless tobacco: Never   Tobacco comments:    2-5 cigarettes smoked daily 09/03/21 ep  Vaping Use   Vaping Use: Never used  Substance Use Topics   Alcohol use: No   Drug use: No    Home Medications Prior to Admission medications   Medication Sig Start Date End Date Taking? Authorizing Provider  albuterol (PROVENTIL) (2.5 MG/3ML) 0.083% nebulizer solution Take 3 mLs (2.5 mg total) by nebulization every 4 (four) hours as needed for wheezing or shortness of breath. Dx: J44.9- COPD. 05/06/21   Lindell Spar, MD  albuterol (VENTOLIN HFA) 108 (90 Base) MCG/ACT inhaler Inhale 2 puffs into the lungs every 4 (four) hours as needed for wheezing or shortness of breath. 05/30/21   Lindell Spar, MD  alendronate (FOSAMAX) 70 MG tablet TAKE 1 TAB BY MOUTH EVERY WEEK ON AN EMPTY STOMACH. 05/16/21   Lindell Spar, MD  atorvastatin (LIPITOR) 80 MG tablet TAKE 1 TABLET BY MOUTH DAILY 01/15/21   Ahmed Prima, [place] M, PA-C  Blood Glucose Monitoring Suppl (BLOOD GLUCOSE SYSTEM PAK) KIT Please dispense based on patient and insurance preference. Use as directed to monitor FSBS 1x daily. Dx: E11.9. 09/29/19   Alycia Rossetti, MD  budesonide-formoterol [facility]) 160-4.5 MCG/ACT inhaler Inhale 2 puffs into the lungs 2 (two) times daily. 05/06/21   Lindell Spar, MD  Calcium Carb-Cholecalciferol 600-800 MG-UNIT TABS Take 1 tablet by mouth 2 (two) times daily.    [provider]  clopidogrel (PLAVIX) 75 MG tablet Take 1 tablet (75 mg total) by mouth daily. 05/06/21   Lindell Spar, MD  doxycycline (VIBRA-TABS) 100 MG tablet Take 1 tablet (100 mg total)  by mouth 2 (two) times daily. 09/03/21   Collene Gobble, MD  ezetimibe (ZETIA) 10 MG tablet TAKE 1 TABLET(10 MG) BY MOUTH DAILY 04/15/21   Ahmed Prima, [place] M, PA-C  furosemide (LASIX) 40 MG tablet ALTERNATE TAKING 2 TABLETS ONE DAY THEN 1 TABLET NEXT DAY AND REPEAT 04/18/21   Lindell Spar, MD  glipiZIDE (GLUCOTROL) 10 MG tablet Take 1 tablet (10 mg total) by mouth 2 (two) times daily before a meal. 02/27/21   [place], Modena Nunnery, MD  Glucose Blood (BLOOD GLUCOSE TEST STRIPS) STRP Please dispense based on patient and insurance preference. Use as directed to monitor FSBS 1x daily. Dx: E11.9. 09/29/19   Alycia Rossetti, MD  ipratropium (ATROVENT) 0.03 % nasal spray Place 2 sprays into both nostrils every 12 (twelve) hours. 04/04/21   Lindell Spar, MD  Lancets MISC Please dispense based on patient and insurance preference. Use as directed to monitor FSBS 1x  daily. Dx: E11.9. 04/18/21   Lindell Spar, MD  LINZESS 145 MCG CAPS capsule TAKE 1 CAPSULE BY MOUTH BEFORE BREAKFAST. 02/13/21   Alycia Rossetti, MD  metoprolol tartrate (LOPRESSOR) 25 MG tablet Take 1 tablet (25 mg total) by mouth 2 (two) times daily. 02/13/21   [place], Modena Nunnery, MD  nitroGLYCERIN (NITROSTAT) 0.4 MG SL tablet Place 1 tablet (0.4 mg total) under the tongue every 5 (five) minutes as needed for chest pain. If no improvement with 3 doses call 911 08/27/18   Delsa Grana, PA-C  nystatin (MYCOSTATIN) 100000 UNIT/ML suspension Use as directed 5 mLs (500,000 Units total) in the mouth or throat 4 (four) times daily. Swish and swallow 03/11/21   Lindell Spar, MD  OXYGEN Inhale 3 L into the lungs continuous.     [provider]  pantoprazole (PROTONIX) 40 MG tablet TAKE 1 TABLET(40 MG) BY MOUTH TWICE DAILY Patient taking differently: Take 40 mg by mouth 2 (two) times daily. 06/04/21   Lindell Spar, MD  polyethylene glycol [facility] Floria Raveling) packet Take 17 g by mouth daily. 06/04/18   Varney Biles, MD  potassium chloride SA  (KLOR-CON) 20 MEQ tablet Take 1 tablet (20 mEq total) by mouth daily for 3 days. Patient not taking: No sig reported 08/28/20 06/27/21  Alycia Rossetti, MD  predniSONE (DELTASONE) 10 MG tablet Take 57m once a day 02/18/21   DAlycia Rossetti MD  predniSONE (DELTASONE) 10 MG tablet 4 x 3 days, 3 x 3 days, 2 x 3 days, then 1 daily thereafter 09/03/21   BCollene Gobble MD  predniSONE (DELTASONE) 20 MG tablet Take 1 tablet (20 mg total) by mouth daily with breakfast. 09/03/21   BCollene Gobble MD  spironolactone (ALDACTONE) 25 MG tablet Take 1 tablet (25 mg total) by mouth daily. 05/31/21   PLindell Spar MD  Tiotropium Bromide Monohydrate (SPIRIVA RESPIMAT) 2.5 MCG/ACT AERS Inhale 2 puffs into the lungs daily. 02/22/21   DAlycia Rossetti MD    Allergies    Keflex [cephalexin], Losartan, Sulfa antibiotics, Avelox [moxifloxacin hcl in nacl], and Toradol [ketorolac tromethamine]  Review of Systems   Review of Systems  All other systems reviewed and are negative.  Physical Exam Updated Vital Signs BP (!) 149/84 (BP Location: Right Arm)   Pulse 100   Temp 98 F (36.7 C)   Resp (!) 26   Ht _0  (1.575 m)   Wt 85.7 kg   SpO2 97%   BMI 34.57 kg/m   Physical Exam Vitals and nursing note reviewed.  Constitutional:      General: She is not in acute distress.    Appearance: She is well-developed. She is not diaphoretic.     Comments: Patient is a chronically ill-appearing female in mild respiratory distress.  HENT:     Head: Normocephalic and atraumatic.  Cardiovascular:     Rate and Rhythm: Normal rate and regular rhythm.     Heart sounds: No murmur heard.   No friction rub. No gallop.  Pulmonary:     Effort: No respiratory distress.     Breath sounds: Rhonchi present.     Comments: Patient in mild respiratory distress.  She becomes tachypneic with minimal exertion.  There are bilateral expiratory rhonchi throughout. Abdominal:     General: Bowel sounds are normal. There is no  distension.     Palpations: Abdomen is soft.     Tenderness: There is no abdominal tenderness.  Musculoskeletal:  General: Normal range of motion.     Cervical back: Normal range of motion and neck supple.     Right lower leg: Edema present.     Left lower leg: Edema present.     Comments: There is 1+ pitting edema of both lower extremities.  Skin:    General: Skin is warm and dry.  Neurological:     General: No focal deficit present.     Mental Status: She is alert and oriented to person, place, and time.    ED Results / Procedures / Treatments   Labs (all labs ordered are listed, but only abnormal results are displayed) Labs Reviewed  RESP PANEL BY RT-PCR (FLU A&B, COVID) [id number]  COMPREHENSIVE METABOLIC PANEL  BRAIN NATRIURETIC PEPTIDE  CBC WITH DIFFERENTIAL/PLATELET  TROPONIN I (HIGH SENSITIVITY)    EKG EKG Interpretation  Date/Time:  Wednesday September 04 2021 23:41:34 EDT Ventricular Rate:  98 PR Interval:  163 QRS Duration: 134 QT Interval:  409 QTC Calculation: 523 R Axis:   97 Text Interpretation: Sinus rhythm LAE, consider biatrial enlargement Right bundle branch block No significant change since 06/27/2021 Confirmed by Veryl Speak [phone]) on 09/05/2021 12:15:32 AM  Radiology No results found.  Procedures Procedures   Medications Ordered in ED Medications  methylPREDNISolone sodium succinate (SOLU-MEDROL) 125 mg/2 mL injection 125 mg (has no administration in time range)  albuterol (VENTOLIN HFA) 108 (90 Base) MCG/ACT inhaler 2 puff (has no administration in time range)  morphine 4 MG/ML injection 4 mg (has no administration in time range)    ED Course  I have reviewed the triage vital signs and the nursing notes.  Pertinent labs & imaging results that were available during my care of the patient were reviewed by me and considered in my medical decision making (see chart for details).    MDM Rules/Calculators/A&P  Patient presenting here with  complaints of nasal congestion and shortness of breath.  She has extensive past medical history as described in the HPI.  Her main issue this evening seems to be an exacerbation of COPD.  She was given Solu-Medrol along with breathing treatments and does seem to be feeling somewhat better.    She is also complaining of back pain and has requested pain medication on several occasions.  She has received 2 doses of morphine.  Her work-up here is essentially unremarkable.  There is a mild elevation of her BNP, but troponin is negative and EKG is unchanged.  I doubt CHF is a contributing factor at this time.  Patient seems appropriate for discharge.  Final Clinical Impression(s) / ED Diagnoses Final diagnoses:  None    Rx / DC Orders ED Discharge Orders     None        Veryl Speak, MD 09/05/21 [phone]

## 2021-09-04 NOTE — ED Triage Notes (Addendum)
Pov from home with husband stating she cannot breathe out of her nose that is it congested. Has been going on for 2-3 days.     3l [place] chronically   Says that she has lower back pain and leg pain but is out of meds.  Has been using nasal sprays

## 2021-09-05 DIAGNOSIS — J441 Chronic obstructive pulmonary disease with (acute) exacerbation: Secondary | ICD-10-CM | POA: Diagnosis not present

## 2021-09-05 DIAGNOSIS — R0602 Shortness of breath: Secondary | ICD-10-CM | POA: Diagnosis not present

## 2021-09-05 LAB — COMPREHENSIVE METABOLIC PANEL
ALT: 62 U/L — ABNORMAL HIGH (ref 0–44)
AST: 79 U/L — ABNORMAL HIGH (ref 15–41)
Albumin: 3.8 g/dL (ref 3.5–5.0)
Alkaline Phosphatase: 98 U/L (ref 38–126)
Anion gap: 12 (ref 5–15)
BUN: 23 mg/dL (ref 8–23)
CO2: 31 mmol/L (ref 22–32)
Calcium: 8.1 mg/dL — ABNORMAL LOW (ref 8.9–10.3)
Chloride: 86 mmol/L — ABNORMAL LOW (ref 98–111)
Creatinine, Ser: 1.24 mg/dL — ABNORMAL HIGH (ref 0.44–1.00)
GFR, Estimated: 48 mL/min — ABNORMAL LOW (ref >60)
Glucose, Bld: 245 mg/dL — ABNORMAL HIGH (ref 70–99)
Potassium: 3.3 mmol/L — ABNORMAL LOW (ref 3.5–5.1)
Sodium: 129 mmol/L — ABNORMAL LOW (ref 135–145)
Total Bilirubin: 0.4 mg/dL (ref 0.3–1.2)
Total Protein: 6.9 g/dL (ref 6.5–8.1)

## 2021-09-05 LAB — CBC WITH DIFFERENTIAL/PLATELET
Abs Immature Granulocytes: 0.21 10*3/uL — ABNORMAL HIGH (ref 0.00–0.07)
Basophils Absolute: 0.1 10*3/uL (ref 0.0–0.1)
Basophils Relative: 0 %
Eosinophils Absolute: 0 10*3/uL (ref 0.0–0.5)
Eosinophils Relative: 0 %
HCT: 40.5 % (ref 36.0–46.0)
Hemoglobin: 13.1 g/dL (ref 12.0–15.0)
Immature Granulocytes: 1 %
Lymphocytes Relative: 13 %
Lymphs Abs: 2.5 10*3/uL (ref 0.7–4.0)
MCH: 29.4 pg (ref 26.0–34.0)
MCHC: 32.3 g/dL (ref 30.0–36.0)
MCV: 90.8 fL (ref 80.0–100.0)
Monocytes Absolute: 2 10*3/uL — ABNORMAL HIGH (ref 0.1–1.0)
Monocytes Relative: 11 %
Neutro Abs: 14.7 10*3/uL — ABNORMAL HIGH (ref 1.7–7.7)
Neutrophils Relative %: 75 %
Platelets: 318 10*3/uL (ref 150–400)
RBC: 4.46 MIL/uL (ref 3.87–5.11)
RDW: 14.2 % (ref 11.5–15.5)
WBC: 19.5 10*3/uL — ABNORMAL HIGH (ref 4.0–10.5)
nRBC: 0 % (ref 0.0–0.2)

## 2021-09-05 LAB — RESP PANEL BY RT-PCR (FLU A&B, COVID) ARPGX2
Influenza A by PCR: NEGATIVE
Influenza B by PCR: NEGATIVE
SARS Coronavirus 2 by RT PCR: NEGATIVE

## 2021-09-05 LAB — TROPONIN I (HIGH SENSITIVITY)
Troponin I (High Sensitivity): 9 ng/L (ref <18)
Troponin I (High Sensitivity): 10 ng/L (ref <18)

## 2021-09-05 LAB — BRAIN NATRIURETIC PEPTIDE: B Natriuretic Peptide: 468 pg/mL — ABNORMAL HIGH (ref 0.0–100.0)

## 2021-09-05 MED ORDER — IPRATROPIUM-ALBUTEROL 0.5-2.5 (3) MG/3ML IN SOLN
3.0000 mL | Freq: Once | RESPIRATORY_TRACT | Status: AC
  Administered 2021-09-05: 3 mL via RESPIRATORY_TRACT
  Filled 2021-09-05: qty 3

## 2021-09-05 MED ORDER — MORPHINE SULFATE (PF) 4 MG/ML IV SOLN
4.0000 mg | Freq: Once | INTRAVENOUS | Status: AC
  Administered 2021-09-05: 4 mg via INTRAVENOUS
  Filled 2021-09-05: qty 1

## 2021-09-05 MED ORDER — GUAIFENESIN-CODEINE 100-10 MG/5ML PO SOLN: 10.0000 mL | Freq: Four times a day (QID) | ORAL | 0 refills | Status: DC | PRN

## 2021-09-05 NOTE — Discharge Instructions (Addendum)
Continue prednisone and doxycycline as previously prescribed.  Begin taking Robitussin with codeine as prescribed as needed for cough.  Continue breathing treatments every 4 hours as needed for wheezing/difficulty breathing.  Follow-up with primary doctor in the next week, and return to the ER if you develop severe chest pain, worsening breathing, or other new and concerning symptoms.

## 2021-09-09 ENCOUNTER — Other Ambulatory Visit: Payer: Self-pay | Admitting: Emergency Medicine

## 2021-09-09 ENCOUNTER — Telehealth: Payer: Self-pay | Admitting: Emergency Medicine

## 2021-09-09 MED ORDER — ALBUTEROL SULFATE (2.5 MG/3ML) 0.083% IN NEBU: 2.5000 mg | INHALATION_SOLUTION | RESPIRATORY_TRACT | 2 refills | Status: AC | PRN

## 2021-09-09 MED ORDER — DOXYCYCLINE HYCLATE 100 MG PO TABS: 100.0000 mg | ORAL_TABLET | Freq: Two times a day (BID) | ORAL | 0 refills | Status: DC

## 2021-09-09 NOTE — Telephone Encounter (Signed)
I think I gave her a script for doxy and pred at her phone visit on 9/7. I am ok extending the doxy for 5 more days > '100mg'$  bid x 5 days.  I am not going to fill the narcotics, she will have to get this from her PCP

## 2021-09-09 NOTE — Telephone Encounter (Signed)
I called and spoke with patient regarding Dr, byrum recs. Patient verbalized understanding but was upset about not getting narcotics, patient stated PCP will not give it either. Patient was seen in pain clinic before, informed patient to contact them. Patient verbalized understanding, nothing further needed.

## 2021-09-09 NOTE — Telephone Encounter (Signed)
Called and spoke with Patient.  Patient stated she was seen in hospital 09/04/21 for COPD exacerbation. Patient stated she is needing a prescription for Doxycycline capsules sent [employer]. Patient stated she feels additional antibiotic to get over infection. Patient stated she is having  a productive cough with green sputum. Patient requested oxycodone every 6 hours as needed for her increased pain in her legs and side to be sent to [facility].  Advise Patient about contacting PCP, but Patient stated Dr. Lamonte Sakai had refilled in the past. Patient requested albuterol neb refill to [facility].  Albuterol nebs sent to requested pharmacy.  Message routed to Dr. Lamonte Sakai to advise on Doxycycline and Oxycodone

## 2021-09-24 DIAGNOSIS — E1169 Type 2 diabetes mellitus with other specified complication: Secondary | ICD-10-CM | POA: Diagnosis not present

## 2021-09-24 DIAGNOSIS — J441 Chronic obstructive pulmonary disease with (acute) exacerbation: Secondary | ICD-10-CM | POA: Diagnosis not present

## 2021-09-24 DIAGNOSIS — F17210 Nicotine dependence, cigarettes, uncomplicated: Secondary | ICD-10-CM | POA: Diagnosis not present

## 2021-09-24 DIAGNOSIS — Z6836 Body mass index (BMI) 36.0-36.9, adult: Secondary | ICD-10-CM | POA: Diagnosis not present

## 2021-09-24 DIAGNOSIS — E7800 Pure hypercholesterolemia, unspecified: Secondary | ICD-10-CM | POA: Diagnosis not present

## 2021-09-24 DIAGNOSIS — I509 Heart failure, unspecified: Secondary | ICD-10-CM | POA: Diagnosis not present

## 2021-09-26 ENCOUNTER — Other Ambulatory Visit: Payer: Self-pay | Admitting: Family Medicine

## 2021-10-03 ENCOUNTER — Other Ambulatory Visit: Payer: Self-pay | Admitting: Internal Medicine

## 2021-10-03 ENCOUNTER — Other Ambulatory Visit (HOSPITAL_COMMUNITY): Payer: Self-pay | Admitting: Internal Medicine

## 2021-10-03 DIAGNOSIS — E876 Hypokalemia: Secondary | ICD-10-CM | POA: Diagnosis not present

## 2021-10-03 DIAGNOSIS — E1169 Type 2 diabetes mellitus with other specified complication: Secondary | ICD-10-CM | POA: Diagnosis not present

## 2021-10-03 DIAGNOSIS — I509 Heart failure, unspecified: Secondary | ICD-10-CM | POA: Diagnosis not present

## 2021-10-03 DIAGNOSIS — R1011 Right upper quadrant pain: Secondary | ICD-10-CM

## 2021-10-03 DIAGNOSIS — Z1331 Encounter for screening for depression: Secondary | ICD-10-CM | POA: Diagnosis not present

## 2021-10-03 DIAGNOSIS — F17210 Nicotine dependence, cigarettes, uncomplicated: Secondary | ICD-10-CM | POA: Diagnosis not present

## 2021-10-03 DIAGNOSIS — R7989 Other specified abnormal findings of blood chemistry: Secondary | ICD-10-CM

## 2021-10-03 DIAGNOSIS — E7800 Pure hypercholesterolemia, unspecified: Secondary | ICD-10-CM | POA: Diagnosis not present

## 2021-10-03 DIAGNOSIS — J441 Chronic obstructive pulmonary disease with (acute) exacerbation: Secondary | ICD-10-CM | POA: Diagnosis not present

## 2021-10-03 DIAGNOSIS — R109 Unspecified abdominal pain: Secondary | ICD-10-CM | POA: Diagnosis not present

## 2021-10-03 DIAGNOSIS — Z1389 Encounter for screening for other disorder: Secondary | ICD-10-CM | POA: Diagnosis not present

## 2021-10-04 ENCOUNTER — Ambulatory Visit (HOSPITAL_COMMUNITY)
Admission: RE | Admit: 2021-10-04 | Discharge: 2021-10-04 | Disposition: A | Discharge status: Home / Self Care | Payer: Medicare Other | Source: Ambulatory Visit | Attending: Internal Medicine | Admitting: Internal Medicine

## 2021-10-04 ENCOUNTER — Other Ambulatory Visit: Payer: Self-pay

## 2021-10-04 DIAGNOSIS — N200 Calculus of kidney: Secondary | ICD-10-CM | POA: Diagnosis not present

## 2021-10-04 DIAGNOSIS — K8020 Calculus of gallbladder without cholecystitis without obstruction: Secondary | ICD-10-CM | POA: Diagnosis not present

## 2021-10-04 DIAGNOSIS — R1011 Right upper quadrant pain: Secondary | ICD-10-CM | POA: Diagnosis not present

## 2021-10-04 DIAGNOSIS — R300 Dysuria: Secondary | ICD-10-CM | POA: Diagnosis not present

## 2021-10-04 DIAGNOSIS — R7989 Other specified abnormal findings of blood chemistry: Secondary | ICD-10-CM | POA: Insufficient documentation

## 2021-10-04 DIAGNOSIS — N281 Cyst of kidney, acquired: Secondary | ICD-10-CM | POA: Diagnosis not present

## 2021-10-07 DIAGNOSIS — E876 Hypokalemia: Secondary | ICD-10-CM | POA: Diagnosis not present

## 2021-10-07 DIAGNOSIS — R109 Unspecified abdominal pain: Secondary | ICD-10-CM | POA: Diagnosis not present

## 2021-10-07 DIAGNOSIS — Z23 Encounter for immunization: Secondary | ICD-10-CM | POA: Diagnosis not present

## 2021-10-07 DIAGNOSIS — E1169 Type 2 diabetes mellitus with other specified complication: Secondary | ICD-10-CM | POA: Diagnosis not present

## 2021-10-07 DIAGNOSIS — R319 Hematuria, unspecified: Secondary | ICD-10-CM | POA: Diagnosis not present

## 2021-10-07 DIAGNOSIS — Z6836 Body mass index (BMI) 36.0-36.9, adult: Secondary | ICD-10-CM | POA: Diagnosis not present

## 2021-10-07 DIAGNOSIS — J441 Chronic obstructive pulmonary disease with (acute) exacerbation: Secondary | ICD-10-CM | POA: Diagnosis not present

## 2021-10-07 DIAGNOSIS — I509 Heart failure, unspecified: Secondary | ICD-10-CM | POA: Diagnosis not present

## 2021-10-09 ENCOUNTER — Other Ambulatory Visit: Payer: Self-pay | Admitting: Family Medicine

## 2021-10-09 DIAGNOSIS — K828 Other specified diseases of gallbladder: Secondary | ICD-10-CM

## 2021-10-10 ENCOUNTER — Telehealth: Payer: Self-pay | Admitting: Emergency Medicine

## 2021-10-11 NOTE — Telephone Encounter (Signed)
Call returned to patient, made aware of RB recommendations. She states with her condition she is not able to come into the office stating her health has significantly declined. I made her aware being that these are medication changes he may require that she at least have a video visit. She does not have mychart but states she has a family member that can help her with it.   RB please advise if this can be a televisit if not we can make it a video visit with help from a family member. Thanks :)

## 2021-10-11 NOTE — Telephone Encounter (Signed)
Call returned to Cindy [name] with [employer]. They were doing a check on her trilogy machine and the RT has recommended that she change to nebulizer medications. She does not feel that her Spiriva and Symbicort. They recommend perforomist, budesonide, and yupelri to replace the spiriva and symbicort.   Patient will need a recent OV or televisit and note will need to reflect the change from inhaler to nebulizer medications.   Call made to patient, confirmed DOB. Made [employer] reached out and we will f/u with her once we speak to Dr. Lamonte [name]. Voiced understanding. Patient aware response will be delayed as Cindy [name] returns next week.   Cindy [name] please advise if switch okay. If so we will get patient scheduled with you or app. Pt aware response will be delayed. Thanks :)

## 2021-10-11 NOTE — Telephone Encounter (Signed)
I'm not going to make this change without a visit with her to trouble shoot. I can discuss w her at an OV - either our next planned OV or we can set one up if she believes this is an urgent issue

## 2021-10-14 ENCOUNTER — Other Ambulatory Visit: Payer: Self-pay | Admitting: Internal Medicine

## 2021-10-14 ENCOUNTER — Other Ambulatory Visit (HOSPITAL_COMMUNITY): Payer: Self-pay | Admitting: Internal Medicine

## 2021-10-14 DIAGNOSIS — K828 Other specified diseases of gallbladder: Secondary | ICD-10-CM

## 2021-10-14 NOTE — Telephone Encounter (Signed)
Telephone visit should be ok

## 2021-10-14 NOTE — Telephone Encounter (Signed)
Spoke with the pt and notified of response per RB  Televisit was scheduled  Nothing further needed

## 2021-10-15 ENCOUNTER — Ambulatory Visit (HOSPITAL_COMMUNITY)
Admission: RE | Admit: 2021-10-15 | Discharge: 2021-10-15 | Disposition: A | Discharge status: Home / Self Care | Payer: Medicare Other | Source: Ambulatory Visit | Attending: Internal Medicine | Admitting: Internal Medicine

## 2021-10-15 ENCOUNTER — Other Ambulatory Visit (HOSPITAL_COMMUNITY): Payer: Self-pay | Admitting: Internal Medicine

## 2021-10-15 ENCOUNTER — Other Ambulatory Visit: Payer: Self-pay

## 2021-10-15 DIAGNOSIS — K828 Other specified diseases of gallbladder: Secondary | ICD-10-CM

## 2021-10-15 DIAGNOSIS — K760 Fatty (change of) liver, not elsewhere classified: Secondary | ICD-10-CM | POA: Diagnosis not present

## 2021-10-15 DIAGNOSIS — K5730 Diverticulosis of large intestine without perforation or abscess without bleeding: Secondary | ICD-10-CM | POA: Diagnosis not present

## 2021-10-15 DIAGNOSIS — N83201 Unspecified ovarian cyst, right side: Secondary | ICD-10-CM | POA: Diagnosis not present

## 2021-10-17 DIAGNOSIS — E876 Hypokalemia: Secondary | ICD-10-CM | POA: Diagnosis not present

## 2021-10-17 DIAGNOSIS — E1169 Type 2 diabetes mellitus with other specified complication: Secondary | ICD-10-CM | POA: Diagnosis not present

## 2021-10-17 DIAGNOSIS — I509 Heart failure, unspecified: Secondary | ICD-10-CM | POA: Diagnosis not present

## 2021-10-21 DIAGNOSIS — E876 Hypokalemia: Secondary | ICD-10-CM | POA: Diagnosis not present

## 2021-10-21 DIAGNOSIS — E1169 Type 2 diabetes mellitus with other specified complication: Secondary | ICD-10-CM | POA: Diagnosis not present

## 2021-10-21 DIAGNOSIS — R109 Unspecified abdominal pain: Secondary | ICD-10-CM | POA: Diagnosis not present

## 2021-10-21 DIAGNOSIS — F17210 Nicotine dependence, cigarettes, uncomplicated: Secondary | ICD-10-CM | POA: Diagnosis not present

## 2021-10-21 DIAGNOSIS — I509 Heart failure, unspecified: Secondary | ICD-10-CM | POA: Diagnosis not present

## 2021-10-21 DIAGNOSIS — R319 Hematuria, unspecified: Secondary | ICD-10-CM | POA: Diagnosis not present

## 2021-10-21 DIAGNOSIS — J441 Chronic obstructive pulmonary disease with (acute) exacerbation: Secondary | ICD-10-CM | POA: Diagnosis not present

## 2021-10-21 DIAGNOSIS — J9611 Chronic respiratory failure with hypoxia: Secondary | ICD-10-CM | POA: Diagnosis not present

## 2021-10-22 ENCOUNTER — Encounter: Payer: Self-pay | Admitting: Urology

## 2021-10-22 ENCOUNTER — Other Ambulatory Visit (HOSPITAL_BASED_OUTPATIENT_CLINIC_OR_DEPARTMENT_OTHER): Payer: Self-pay | Admitting: Internal Medicine

## 2021-10-22 ENCOUNTER — Other Ambulatory Visit (HOSPITAL_COMMUNITY): Payer: Self-pay | Admitting: Internal Medicine

## 2021-10-22 ENCOUNTER — Other Ambulatory Visit: Payer: Self-pay

## 2021-10-22 ENCOUNTER — Ambulatory Visit (INDEPENDENT_AMBULATORY_CARE_PROVIDER_SITE_OTHER): Payer: Medicare Other | Admitting: Urology

## 2021-10-22 ENCOUNTER — Other Ambulatory Visit: Payer: Self-pay | Admitting: Internal Medicine

## 2021-10-22 ENCOUNTER — Ambulatory Visit: Payer: Medicare Other | Admitting: General Surgery

## 2021-10-22 VITALS — BP 144/84 | HR 111 | Temp 98.9°F

## 2021-10-22 DIAGNOSIS — R319 Hematuria, unspecified: Secondary | ICD-10-CM | POA: Diagnosis not present

## 2021-10-22 DIAGNOSIS — N736 Female pelvic peritoneal adhesions (postinfective): Secondary | ICD-10-CM

## 2021-10-22 DIAGNOSIS — N200 Calculus of kidney: Secondary | ICD-10-CM | POA: Diagnosis not present

## 2021-10-22 DIAGNOSIS — R102 Pelvic and perineal pain: Secondary | ICD-10-CM

## 2021-10-22 DIAGNOSIS — N949 Unspecified condition associated with female genital organs and menstrual cycle: Secondary | ICD-10-CM

## 2021-10-22 DIAGNOSIS — I2510 Atherosclerotic heart disease of native coronary artery without angina pectoris: Secondary | ICD-10-CM

## 2021-10-22 DIAGNOSIS — R918 Other nonspecific abnormal finding of lung field: Secondary | ICD-10-CM

## 2021-10-22 NOTE — Progress Notes (Signed)
Urological Symptom Review  Patient is experiencing the following symptoms: N/a   Review of Systems  Gastrointestinal (upper)  : Negative for upper GI symptoms  Gastrointestinal (lower) : Negative for lower GI symptoms  Constitutional : Negative for symptoms  Skin: Negative for skin symptoms  Eyes: Negative for eye symptoms  Ear/Nose/Throat : Negative for Ear/Nose/Throat symptoms  Hematologic/Lymphatic: Negative for Hematologic/Lymphatic symptoms  Cardiovascular : Negative for cardiovascular symptoms  Respiratory : Shortness of breath  Endocrine: Negative for endocrine symptoms  Musculoskeletal: Negative for musculoskeletal symptoms  Neurological: Negative for neurological symptoms  Psychologic: Negative for psychiatric symptoms

## 2021-10-22 NOTE — Progress Notes (Signed)
History of Present Illness: Cindy [name] is a 65 y.o. year old female with a history of urolithiasis, currently not treated because of comorbidities.  She presents today worried about her gallbladder which apparently showed a mass on ultrasound and MRI.  She has had intermittent gross painless hematuria.  No flank or abdominal pain.  Recent renal ultrasound findings: Gallbladder: 2.1 x 1.3 x 2.0 cm complex echogenic lesion involving the fundal region of the gallbladder. Findings could be due to a fundal adenomyomatosis but could not exclude the possibility of a gallbladder carcinoma. MRI abdomen without and with contrast is recommended for further evaluation. No definite gallstones or findings for acute cholecystitis. Negative sonographic Murphy sign.   Common bile duct: Diameter: 3.7 mm   Liver: Increased coarse hepatic echogenicity suggesting fatty infiltration or hepatic inflammatory process/hepatitis. No focal hepatic lesions or intrahepatic biliary dilatation. Portal vein is patent on color Doppler imaging with normal direction of blood flow towards the liver.   IVC: Normal caliber   Pancreas: Unable to visualize due to poor sonographic window.   Spleen: Normal size.   Right Kidney: Length: 10.3 cm. Normal renal cortical thickness and echogenicity. Shadowing gallstones are noted. The largest measures 7.5 mm. No hydronephrosis.   Left Kidney: Length: 10.2 cm. Normal renal cortical thickness and echogenicity. Shadowing echogenic gallstones. The largest measures 7.3 mm. Simple 1.9 x 1.6 x 1.5 cm upper pole cyst. No hydronephrosis.   Abdominal aorta: Not visualized.   Other findings: None.   IMPRESSION: 1. 2.1 x 1.3 x 2.0 cm complex echogenic mass in the fundal region of the gallbladder. 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 Recommend correlation with MRI abdomen without and with contrast. 2. Increased hepatic echogenicity  suggesting fatty infiltration. No hepatic lesions. 3. Bilateral renal calculi. 4. Nonvisualization of the pancreas and aorta.  Past Medical History:  Diagnosis Date   Adrenal adenoma 2014   Left   Arthritis    CAD (coronary artery disease)    a. NSTEMI 07/2018 s/p difficult PCI with overlapping DES to LAD, residual OM2 disease treated medically.   Chronic diastolic CHF (congestive heart failure) (HCC)    Chronic pain    Chronic respiratory failure ([place]) On home 02 2-3L   Compression fracture of lumbar vertebra (HCC)    COPD (chronic obstructive pulmonary disease) ([place])    Essential hypertension    Hyperglycemia, drug-induced    Hyperlipidemia    Kidney stones 2014   Left side, multiple   Leukocytosis    Myocardial infarct [facility])    Osteoporosis 2013   Thyroid disease    pt denies   Tobacco abuse     Past Surgical History:  Procedure Laterality Date   CERVICAL BIOPSY     COLONOSCOPY N/A 03/31/2013   Procedure: COLONOSCOPY;  Surgeon: Rogene Houston, MD;  [facility];  Service: Endoscopy;  Laterality: N/A;  730   CORONARY ATHERECTOMY N/A 08/19/2018   Procedure: CORONARY ATHERECTOMY;  Surgeon: Nelva Bush, MD;  Location: [place] CV LAB;  Service: Cardiovascular;  Laterality: N/A;   CORONARY STENT INTERVENTION N/A 08/19/2018   Procedure: CORONARY STENT INTERVENTION;  Surgeon: Nelva Bush, MD;  Location: [place] CV LAB;  Service: Cardiovascular;  Laterality: N/A;   INTRAVASCULAR ULTRASOUND/IVUS N/A 08/19/2018   Procedure: Intravascular Ultrasound/IVUS;  Surgeon: Nelva Bush, MD;  Location: [place] CV LAB;  Service: Cardiovascular;  Laterality: N/A;   LEFT HEART CATH N/A 08/19/2018   Procedure:  Left Heart Cath;  Surgeon: Nelva Bush, MD;  Location: [place] CV LAB;  Service: Cardiovascular;  Laterality: N/A;   LEFT HEART CATH AND CORONARY ANGIOGRAPHY N/A 08/16/2018   Procedure: LEFT HEART CATH AND CORONARY ANGIOGRAPHY;  Surgeon: Nelva Bush, MD;  Location: [place] CV LAB;  Service: Cardiovascular;  Laterality: N/A;   TUBAL LIGATION      Home Medications:  (Not in a hospital admission)   Allergies:  Allergies  Allergen Reactions   Keflex [Cephalexin] Anaphylaxis and Rash   Losartan     Swelling face   Sulfa Antibiotics    Avelox [Moxifloxacin Hcl In Nacl] Itching and Rash   Toradol [Ketorolac Tromethamine] Swelling, Rash and Other (See Comments)    Bruising, swelling, rash at injection site    Family History  Problem Relation Age of Onset   Heart disease Mother    Hypertension Sister    Hypertension Brother     Social History:  reports that she has been smoking cigarettes. She has a 25.00 pack-year smoking history. She has never used smokeless tobacco. She reports that she does not drink alcohol and does not use drugs.  ROS: A complete review of systems was performed.  All systems are negative except for pertinent findings as noted.  Physical Exam:  Vital signs in last 24 hours: @VSRANGES @ General:  Alert and oriented, in a wheelchair.  Nasal cannula oxygen on. HEENT: Normocephalic, atraumatic Neck: No JVD or lymphadenopathy Cardiovascular: Regular rate  Lungs: Normal respiratory rate with use of accessory muscles Abdomen: Obese Neurologic: Grossly intact   I have reviewed prior pt notes  I have reviewed notes from referring/previous physicians  I have reviewed urinalysis results  I have independently reviewed prior imaging-MRI/ultrasound results reviewed  Impression/Assessment:  Relatively asymptomatic bilateral renal calculi without hydronephrosis.  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  Not anything that I can discuss with her  Plan:  1.  She does have an appointment set up to see Dr. Arnoldo Morale, I would suggest she talk with him about the gallbladder abnormalities  2.  Currently, I  do not think we need to intervene with her renal calculi  3.  I will have her come back in about 3 months for recheck  Jorja Loa 10/22/2021, 8:37 AM  Lillette Boxer. Merle Whitehorn MD

## 2021-10-23 LAB — URINALYSIS, ROUTINE W REFLEX MICROSCOPIC
Bilirubin, UA: NEGATIVE
Ketones, UA: NEGATIVE
Nitrite, UA: NEGATIVE
Specific Gravity, UA: 1.015 (ref 1.005–1.030)
Urobilinogen, Ur: 0.2 mg/dL (ref 0.2–1.0)
pH, UA: 7 (ref 5.0–7.5)

## 2021-10-23 LAB — MICROSCOPIC EXAMINATION: Renal Epithel, UA: NONE SEEN /hpf

## 2021-10-29 ENCOUNTER — Other Ambulatory Visit: Payer: Self-pay

## 2021-10-29 ENCOUNTER — Emergency Department (HOSPITAL_COMMUNITY)
Admission: EM | Admit: 2021-10-29 | Discharge: 2021-10-29 | Disposition: A | Discharge status: Home / Self Care | Payer: Medicare Other | Attending: Emergency Medicine | Admitting: Emergency Medicine

## 2021-10-29 ENCOUNTER — Emergency Department (HOSPITAL_COMMUNITY): Payer: Medicare Other

## 2021-10-29 ENCOUNTER — Encounter (HOSPITAL_COMMUNITY): Payer: Self-pay | Admitting: *Deleted

## 2021-10-29 DIAGNOSIS — I2510 Atherosclerotic heart disease of native coronary artery without angina pectoris: Secondary | ICD-10-CM | POA: Insufficient documentation

## 2021-10-29 DIAGNOSIS — D72829 Elevated white blood cell count, unspecified: Secondary | ICD-10-CM | POA: Insufficient documentation

## 2021-10-29 DIAGNOSIS — F17210 Nicotine dependence, cigarettes, uncomplicated: Secondary | ICD-10-CM | POA: Diagnosis not present

## 2021-10-29 DIAGNOSIS — Z7902 Long term (current) use of antithrombotics/antiplatelets: Secondary | ICD-10-CM | POA: Insufficient documentation

## 2021-10-29 DIAGNOSIS — E119 Type 2 diabetes mellitus without complications: Secondary | ICD-10-CM | POA: Insufficient documentation

## 2021-10-29 DIAGNOSIS — Z7984 Long term (current) use of oral hypoglycemic drugs: Secondary | ICD-10-CM | POA: Diagnosis not present

## 2021-10-29 DIAGNOSIS — R0789 Other chest pain: Secondary | ICD-10-CM | POA: Diagnosis not present

## 2021-10-29 DIAGNOSIS — Z7951 Long term (current) use of inhaled steroids: Secondary | ICD-10-CM | POA: Diagnosis not present

## 2021-10-29 DIAGNOSIS — I700 Atherosclerosis of aorta: Secondary | ICD-10-CM | POA: Diagnosis not present

## 2021-10-29 DIAGNOSIS — J441 Chronic obstructive pulmonary disease with (acute) exacerbation: Secondary | ICD-10-CM | POA: Diagnosis not present

## 2021-10-29 DIAGNOSIS — Z20822 Contact with and (suspected) exposure to covid-19: Secondary | ICD-10-CM | POA: Diagnosis not present

## 2021-10-29 DIAGNOSIS — I110 Hypertensive heart disease with heart failure: Secondary | ICD-10-CM | POA: Insufficient documentation

## 2021-10-29 DIAGNOSIS — M7981 Nontraumatic hematoma of soft tissue: Secondary | ICD-10-CM | POA: Insufficient documentation

## 2021-10-29 DIAGNOSIS — I5032 Chronic diastolic (congestive) heart failure: Secondary | ICD-10-CM | POA: Diagnosis not present

## 2021-10-29 DIAGNOSIS — K760 Fatty (change of) liver, not elsewhere classified: Secondary | ICD-10-CM | POA: Diagnosis not present

## 2021-10-29 DIAGNOSIS — J069 Acute upper respiratory infection, unspecified: Secondary | ICD-10-CM | POA: Insufficient documentation

## 2021-10-29 DIAGNOSIS — R0602 Shortness of breath: Secondary | ICD-10-CM | POA: Diagnosis not present

## 2021-10-29 DIAGNOSIS — R000 Tachycardia, unspecified: Secondary | ICD-10-CM | POA: Diagnosis not present

## 2021-10-29 DIAGNOSIS — E1169 Type 2 diabetes mellitus with other specified complication: Secondary | ICD-10-CM | POA: Diagnosis not present

## 2021-10-29 DIAGNOSIS — Z79899 Other long term (current) drug therapy: Secondary | ICD-10-CM | POA: Insufficient documentation

## 2021-10-29 DIAGNOSIS — E041 Nontoxic single thyroid nodule: Secondary | ICD-10-CM | POA: Diagnosis not present

## 2021-10-29 DIAGNOSIS — E876 Hypokalemia: Secondary | ICD-10-CM | POA: Diagnosis not present

## 2021-10-29 LAB — CBC
HCT: 42.2 % (ref 36.0–46.0)
Hemoglobin: 13.9 g/dL (ref 12.0–15.0)
MCH: 29.6 pg (ref 26.0–34.0)
MCHC: 32.9 g/dL (ref 30.0–36.0)
MCV: 90 fL (ref 80.0–100.0)
Platelets: 269 10*3/uL (ref 150–400)
RBC: 4.69 MIL/uL (ref 3.87–5.11)
RDW: 16.7 % — ABNORMAL HIGH (ref 11.5–15.5)
WBC: 18 10*3/uL — ABNORMAL HIGH (ref 4.0–10.5)
nRBC: 0 % (ref 0.0–0.2)

## 2021-10-29 LAB — BASIC METABOLIC PANEL
Anion gap: 12 (ref 5–15)
BUN: 16 mg/dL (ref 8–23)
CO2: 33 mmol/L — ABNORMAL HIGH (ref 22–32)
Calcium: 8.6 mg/dL — ABNORMAL LOW (ref 8.9–10.3)
Chloride: 86 mmol/L — ABNORMAL LOW (ref 98–111)
Creatinine, Ser: 1.24 mg/dL — ABNORMAL HIGH (ref 0.44–1.00)
GFR, Estimated: 48 mL/min — ABNORMAL LOW (ref >60)
Glucose, Bld: 328 mg/dL — ABNORMAL HIGH (ref 70–99)
Potassium: 2.8 mmol/L — ABNORMAL LOW (ref 3.5–5.1)
Sodium: 131 mmol/L — ABNORMAL LOW (ref 135–145)

## 2021-10-29 LAB — TROPONIN I (HIGH SENSITIVITY)
Troponin I (High Sensitivity): 10 ng/L (ref <18)
Troponin I (High Sensitivity): 10 ng/L (ref <18)

## 2021-10-29 LAB — RESP PANEL BY RT-PCR (FLU A&B, COVID) ARPGX2
Influenza A by PCR: NEGATIVE
Influenza B by PCR: NEGATIVE
SARS Coronavirus 2 by RT PCR: NEGATIVE

## 2021-10-29 MED ORDER — METHYLPREDNISOLONE SODIUM SUCC 125 MG IJ SOLR
125.0000 mg | Freq: Once | INTRAMUSCULAR | Status: AC
  Administered 2021-10-29: 125 mg via INTRAVENOUS
  Filled 2021-10-29: qty 2

## 2021-10-29 MED ORDER — HYDROMORPHONE HCL 1 MG/ML IJ SOLN
1.0000 mg | Freq: Once | INTRAMUSCULAR | Status: AC
  Administered 2021-10-29: 1 mg via INTRAVENOUS
  Filled 2021-10-29: qty 1

## 2021-10-29 MED ORDER — PREDNISONE 10 MG PO TABS: 40.0000 mg | ORAL_TABLET | Freq: Every day | ORAL | 0 refills | Status: DC

## 2021-10-29 MED ORDER — IOHEXOL 350 MG/ML SOLN
100.0000 mL | Freq: Once | INTRAVENOUS | Status: AC | PRN
  Administered 2021-10-29: 80 mL via INTRAVENOUS

## 2021-10-29 MED ORDER — ONDANSETRON HCL 4 MG/2ML IJ SOLN
4.0000 mg | Freq: Once | INTRAMUSCULAR | Status: AC
  Administered 2021-10-29: 4 mg via INTRAVENOUS
  Filled 2021-10-29: qty 2

## 2021-10-29 MED ORDER — POTASSIUM CHLORIDE CRYS ER 20 MEQ PO TBCR
40.0000 meq | EXTENDED_RELEASE_TABLET | Freq: Once | ORAL | Status: AC
  Administered 2021-10-29: 40 meq via ORAL
  Filled 2021-10-29: qty 2

## 2021-10-29 MED ORDER — POTASSIUM CHLORIDE CRYS ER 20 MEQ PO TBCR: 20.0000 meq | EXTENDED_RELEASE_TABLET | Freq: Two times a day (BID) | ORAL | 0 refills | Status: DC

## 2021-10-29 MED ORDER — IPRATROPIUM-ALBUTEROL 0.5-2.5 (3) MG/3ML IN SOLN
3.0000 mL | Freq: Once | RESPIRATORY_TRACT | Status: AC
  Administered 2021-10-29: 3 mL via RESPIRATORY_TRACT
  Filled 2021-10-29: qty 3

## 2021-10-29 NOTE — ED Provider Notes (Signed)
Signout from Dr. Bobby Rumpf.  65 year old female history of COPD on 3 L nasal cannula complaining of increased shortness of breath.  Received steroids and breathing treatments here with improvement in her symptoms.  Remains mildly tachycardic.  Plan is to follow-up on CT angio chest. Physical Exam  BP (!) 142/106   Pulse (!) 111   Temp 97.9 F (36.6 C) (Oral)   Resp 18   Ht 5\' 2"  (1.575 m)   Wt 85.7 kg   SpO2 99%   BMI 34.57 kg/m   Physical Exam  ED Course/Procedures     Procedures  MDM  CT angio chest does not show any evidence of PE.  Reviewed with patient.  She is comfortable plan for discharge.  Recommended follow-up with her pulmonologist.  Return instructions discussed       Hayden Rasmussen, MD 10/30/21 1036

## 2021-10-29 NOTE — Discharge Instructions (Signed)
Take the prednisone as directed 40 mg daily for the next 5 days.  Take the potassium as directed for the next 2 days.  Make an appointment to follow-up with your doctor.

## 2021-10-29 NOTE — ED Notes (Signed)
Patient transported to CT 

## 2021-10-29 NOTE — ED Triage Notes (Signed)
Shortness of breath, itching rash over body and between fingers, c/o congestion, oxygen dependant on 3 liters, onset of symptoms a week ago

## 2021-10-29 NOTE — ED Notes (Signed)
Pt requesting prednisone dosage be changed. EDP aware.

## 2021-10-29 NOTE — ED Provider Notes (Signed)
[facility] EMERGENCY DEPARTMENT Provider Note   CSN: [id number] Arrival date & time: 10/29/21  1050     History Chief Complaint  Patient presents with   Shortness of Breath    Cindy [name] is a 65 y.o. female.  Patient brought in by family member.  Patient with shortness of breath.  Patient normally on 3 L of oxygen at all times.  Patient known to have stage IV COPD.  Patient also with complaint of pain to the left lateral chest wall area.  And she has a bruise there.  Also with some rash or itching to her hands and upper extremities.  Patient's been having more trouble with all this for approximately a week.  Patient is on 10 mg of prednisone currently.  Patient has also had trouble with low potassium before looks like he has been on potassium supplementation but that ended June 2022.  She is on Spiriva.  Patient is on glipizide patient on Proventil nebulizers.  And may be on Symbicort as well.  The patient tells me she take 10 mg of prednisone daily.  Appears patient is given pain medicines frequently.  Last time was beginning of September.  And that was just a 3-day supply.  Past medical history also mentions chronic diastolic congestive heart failure as well.  To have chronic respiratory failure from the COPD.  Known to be hypertensive and a history of diabetes.   Patient states she has been having trouble breathing.  Her nose is all congested.  This making things worse her oxygen is not helping.  Feels like she not moving air well.      Past Medical History:  Diagnosis Date   Adrenal adenoma 2014   Left   Arthritis    CAD (coronary artery disease)    a. NSTEMI 07/2018 s/p difficult PCI with overlapping DES to LAD, residual OM2 disease treated medically.   Chronic diastolic CHF (congestive heart failure) (HCC)    Chronic pain    Chronic respiratory failure (HCC) On home 02 2-3L   Compression fracture of lumbar vertebra (HCC)    COPD (chronic obstructive pulmonary disease)  ([place])    Essential hypertension    Hyperglycemia, drug-induced    Hyperlipidemia    Kidney stones 2014   Left side, multiple   Leukocytosis    Myocardial infarct ([place])    Osteoporosis 2013   Thyroid disease    pt denies   Tobacco abuse     Patient Active Problem List   Diagnosis Date Noted   Secondary hyperparathyroidism ([place]) 01/25/2021   [place]o keratoses 09/27/2019   Diabetes mellitus without complication ([place]) 03/47/4259   Chronic pain 11/18/2018   CAD (coronary artery disease) 10/05/2018   Chronic diastolic CHF (congestive heart failure) (HCC)    S/P PTCA (percutaneous transluminal coronary angioplasty)    Orthopnea    H/O non-ST elevation myocardial infarction (NSTEMI)    Primary hypertension    Abnormal EKG 56/38/7564   Diastolic dysfunction 33/29/5188   Pulmonary hypertension ([place]) 05/01/2016   Rash and nonspecific skin eruption 08/15/2014   Ureteral stone with hydronephrosis 05/30/2013   Allergic rhinitis 04/11/2013   Lumbar back pain 03/03/2013   Compression fracture of L3 lumbar vertebra 03/03/2013   Constipation 03/03/2013   Osteoporosis 03/16/2012   GERD (gastroesophageal reflux disease) 03/16/2012   Thrush 03/02/2012   Chronic hypoxemic respiratory failure ([place]) 02/27/2012   Hyperlipidemia 02/25/2012   Tobacco abuse 09/24/2011   COPD (chronic obstructive pulmonary disease) ([place]) 12/24/2009  Past Surgical History:  Procedure Laterality Date   CERVICAL BIOPSY     COLONOSCOPY N/A 03/31/2013   Procedure: COLONOSCOPY;  Surgeon: Rogene Houston, MD;  Location: [facility];  Service: Endoscopy;  Laterality: N/A;  730   CORONARY ATHERECTOMY N/A 08/19/2018   Procedure: CORONARY ATHERECTOMY;  Surgeon: Nelva Bush, MD;  Location: [place] [facility] LAB;  Service: Cardiovascular;  Laterality: N/A;   CORONARY STENT INTERVENTION N/A 08/19/2018   Procedure: CORONARY STENT INTERVENTION;  Surgeon: Nelva Bush, MD;  Location: [place] [facility];  Service:  Cardiovascular;  Laterality: N/A;   INTRAVASCULAR ULTRASOUND/IVUS N/A 08/19/2018   Procedure: Intravascular Ultrasound/IVUS;  Surgeon: Nelva Bush, MD;  Location: [place] [facility] LAB;  Service: Cardiovascular;  Laterality: N/A;   LEFT HEART CATH N/A 08/19/2018   Procedure: Left Heart Cath;  Surgeon: Nelva Bush, MD;  Location: [place] [facility] LAB;  Service: Cardiovascular;  Laterality: N/A;   LEFT HEART CATH AND CORONARY ANGIOGRAPHY N/A 08/16/2018   Procedure: LEFT HEART CATH AND CORONARY ANGIOGRAPHY;  Surgeon: Nelva Bush, MD;  Location: [place] [facility];  Service: Cardiovascular;  Laterality: N/A;   TUBAL LIGATION       OB History   No obstetric history on file.     Family History  Problem Relation Age of Onset   Heart disease Mother    Hypertension Sister    Hypertension Brother     Social History   Tobacco Use   Smoking status: Some Days    Packs/day: 1.00    Years: 25.00    Pack years: 25.00    Types: Cigarettes   Smokeless tobacco: Never   Tobacco comments:    2-5 cigarettes smoked daily 09/03/21 ep  Vaping Use   Vaping Use: Never used  Substance Use Topics   Alcohol use: No   Drug use: No    Home Medications Prior to Admission medications   Medication Sig Start Date End Date Taking? Authorizing Provider  albuterol (PROVENTIL) (2.5 MG/3ML) 0.083% nebulizer solution Take 3 mLs (2.5 mg total) by nebulization every 4 (four) hours as needed for wheezing or shortness of breath. Dx: J44.9- COPD. 09/09/21  Yes Collene Gobble, MD  albuterol (VENTOLIN HFA) 108 (90 Base) MCG/ACT inhaler Inhale 2 puffs into the lungs every 4 (four) hours as needed for wheezing or shortness of breath. 05/30/21  Yes Lindell Spar, MD  alendronate (FOSAMAX) 70 MG tablet TAKE 1 TAB BY MOUTH EVERY WEEK ON AN EMPTY STOMACH. 05/16/21  Yes Lindell Spar, MD  atorvastatin (LIPITOR) 80 MG tablet TAKE 1 TABLET BY MOUTH DAILY 01/15/21  Yes Strader, [place] M, PA-C  budesonide-formoterol  (SYMBICORT) 160-4.5 MCG/ACT inhaler Inhale 2 puffs into the lungs 2 (two) times daily. 05/06/21  Yes Lindell Spar, MD  clopidogrel (PLAVIX) 75 MG tablet Take 1 tablet (75 mg total) by mouth daily. 05/06/21  Yes Lindell Spar, MD  ezetimibe (ZETIA) 10 MG tablet TAKE 1 TABLET(10 MG) BY MOUTH DAILY 04/15/21  Yes Strader, [place] M, PA-C  furosemide (LASIX) 40 MG tablet ALTERNATE TAKING 2 TABLETS ONE DAY THEN 1 TABLET NEXT DAY AND REPEAT 04/18/21  Yes Lindell Spar, MD  glipiZIDE (GLUCOTROL) 10 MG tablet Take 1 tablet (10 mg total) by mouth 2 (two) times daily before a meal. 02/27/21  Yes [place], Modena Nunnery, MD  Glucose Blood (BLOOD GLUCOSE TEST STRIPS) STRP Please dispense based on patient and insurance preference. Use as directed to monitor FSBS 1x daily. Dx: E11.9. 09/29/19  Yes [place], Modena Nunnery, MD  guaiFENesin-codeine 100-10 MG/5ML syrup Take 10 mLs by mouth every 6 (six) hours as needed for cough. 09/05/21  Yes [place], Nathaneil Canary, MD  ipratropium (ATROVENT) 0.03 % nasal spray Place 2 sprays into both nostrils every 12 (twelve) hours. 04/04/21  Yes Lindell Spar, MD  Lancets MISC Please dispense based on patient and insurance preference. Use as directed to monitor FSBS 1x daily. Dx: E11.9. 04/18/21  Yes Lindell Spar, MD  metoprolol tartrate (LOPRESSOR) 25 MG tablet Take 1 tablet (25 mg total) by mouth 2 (two) times daily. 02/13/21  Yes [place], Modena Nunnery, MD  nitroGLYCERIN (NITROSTAT) 0.4 MG SL tablet Place 1 tablet (0.4 mg total) under the tongue every 5 (five) minutes as needed for chest pain. If no improvement with 3 doses call 911 08/27/18  Yes Delsa Grana, PA-C  nystatin (MYCOSTATIN) 100000 UNIT/ML suspension Use as directed 5 mLs (500,000 Units total) in the mouth or throat 4 (four) times daily. Swish and swallow 03/11/21  Yes Lindell Spar, MD  OXYGEN Inhale 3 L into the lungs continuous.    Yes [provider]  pantoprazole (PROTONIX) 40 MG tablet TAKE 1 TABLET(40 MG) BY MOUTH TWICE  DAILY Patient taking differently: Take 40 mg by mouth 2 (two) times daily. 06/04/21  Yes Lindell Spar, MD  predniSONE (DELTASONE) 10 MG tablet Take 32m once a day 02/18/21  Yes [place], KModena Nunnery MD  spironolactone (ALDACTONE) 25 MG tablet Take 1 tablet (25 mg total) by mouth daily. 05/31/21  Yes PLindell Spar MD  Tiotropium Bromide Monohydrate (SPIRIVA RESPIMAT) 2.5 MCG/ACT AERS Inhale 2 puffs into the lungs daily. 02/22/21  Yes [place], KModena Nunnery MD  Blood Glucose Monitoring Suppl (BLOOD GLUCOSE SYSTEM PAK) KIT Please dispense based on patient and insurance preference. Use as directed to monitor FSBS 1x daily. Dx: E11.9. 09/29/19   DAlycia Rossetti MD  doxycycline (VIBRA-TABS) 100 MG tablet Take 1 tablet (100 mg total) by mouth 2 (two) times daily. Patient not taking: Reported on 10/29/2021 09/09/21   BCollene Gobble MD  LINZESS 145 MCG CAPS capsule TAKE 1 CAPSULE BY MOUTH BEFORE BREAKFAST. Patient not taking: Reported on 10/29/2021 02/13/21   DAlycia Rossetti MD  polyethylene glycol ([facility]/ GFloria Raveling packet Take 17 g by mouth daily. 06/04/18   NVarney Biles MD  potassium chloride SA (KLOR-CON) 20 MEQ tablet Take 1 tablet (20 mEq total) by mouth daily for 3 days. Patient not taking: No sig reported 08/28/20 06/27/21  DAlycia Rossetti MD    Allergies    Keflex [cephalexin], Losartan, Sulfa antibiotics, Avelox [moxifloxacin hcl in nacl], and Toradol [ketorolac tromethamine]  Review of Systems   Review of Systems  Constitutional:  Negative for chills and fever.  HENT:  Positive for congestion. Negative for ear pain and sore throat.   Eyes:  Negative for pain and visual disturbance.  Respiratory:  Positive for shortness of breath and wheezing. Negative for cough.   Cardiovascular:  Positive for chest pain. Negative for palpitations.  Gastrointestinal:  Negative for abdominal pain and vomiting.  Genitourinary:  Negative for dysuria and hematuria.  Musculoskeletal:  Negative for  arthralgias and back pain.  Skin:  Negative for color change and rash.  Neurological:  Negative for seizures and syncope.  All other systems reviewed and are negative.  Physical Exam Updated Vital Signs BP (!) 135/103   Pulse (!) 120   Temp 97.9 F (36.6 C) (Oral)   Resp 20  Ht 1.575 m (5' 2")   Wt 85.7 kg   SpO2 96%   BMI 34.57 kg/m   Physical Exam Vitals and nursing note reviewed.  Constitutional:      General: She is not in acute distress.    Appearance: Normal appearance. She is well-developed.  HENT:     Head: Normocephalic and atraumatic.  Eyes:     Conjunctiva/sclera: Conjunctivae normal.  Cardiovascular:     Rate and Rhythm: Normal rate and regular rhythm.     Heart sounds: No murmur heard. Pulmonary:     Effort: Pulmonary effort is normal. No respiratory distress.     Breath sounds: Wheezing present.     Comments: Left lateral chest wall with an area of bruising measuring about 2 x 3 cm.  Some tenderness to palpation there but the area of tenderness is broader.  Chest:     Chest wall: Tenderness present.  Abdominal:     Palpations: Abdomen is soft.     Tenderness: There is no abdominal tenderness.  Musculoskeletal:        General: No swelling.     Cervical back: Neck supple.  Skin:    General: Skin is warm and dry.     Findings: Rash present.     Comments: Patient with some bruising to the skin not too unusual for somebody on prednisone or her age.  No distinct rash.  But she says that the hands and arms itch.  No vesicles no breakdown of the skin no pustules  Neurological:     General: No focal deficit present.     Mental Status: She is alert and oriented to person, place, and time.    ED Results / Procedures / Treatments   Labs (all labs ordered are listed, but only abnormal results are displayed) Labs Reviewed  BASIC METABOLIC PANEL - Abnormal; Notable for the following components:      Result Value   Sodium 131 (*)    Potassium 2.8 (*)     Chloride 86 (*)    CO2 33 (*)    Glucose, Bld 328 (*)    Creatinine, Ser 1.24 (*)    Calcium 8.6 (*)    GFR, Estimated 48 (*)    All other components within normal limits  CBC - Abnormal; Notable for the following components:   WBC 18.0 (*)    RDW 16.7 (*)    All other components within normal limits  RESP PANEL BY RT-PCR (FLU A&B, COVID) [id number]  TROPONIN I (HIGH SENSITIVITY)  TROPONIN I (HIGH SENSITIVITY)    EKG EKG Interpretation  Date/Time:  Tuesday October 29 2021 11:31:36 EDT Ventricular Rate:  133 PR Interval:  130 QRS Duration: 126 QT Interval:  374 QTC Calculation: 556 R Axis:   251 Text Interpretation: Sinus tachycardia Right bundle branch block Abnormal ECG Otherwise no significant change except for tachycardia Confirmed by Fredia Sorrow [phone]) on 10/29/2021 1:01:48 PM  Radiology DG Chest 2 View  Result Date: 10/29/2021 CLINICAL DATA:  Shortness of breath EXAM: CHEST - 2 VIEW COMPARISON:  09/05/2021 FINDINGS: The heart size and mediastinal contours are within normal limits. Both lungs are clear. No pleural effusion. No acute osseous abnormality. Chronic lower thoracic compression fracture. IMPRESSION: No acute process in the chest. Electronically Signed   By: Macy Mis M.D.   On: 10/29/2021 12:16    Procedures Procedures   Medications Ordered in ED Medications  methylPREDNISolone sodium succinate (SOLU-MEDROL) 125 mg/2 mL injection 125 mg (125 mg  Intravenous Given 10/29/21 1437)  potassium chloride SA (KLOR-CON) CR tablet 40 mEq (40 mEq Oral Given 10/29/21 1443)  ipratropium-albuterol (DUONEB) 0.5-2.5 (3) MG/3ML nebulizer solution 3 mL (3 mLs Nebulization Given 10/29/21 1507)  ondansetron (ZOFRAN) injection 4 mg (4 mg Intravenous Given 10/29/21 1434)  HYDROmorphone (DILAUDID) injection 1 mg (1 mg Intravenous Given 10/29/21 1440)    ED Course  I have reviewed the triage vital signs and the nursing notes.  Pertinent labs & imaging results that were  available during my care of the patient were reviewed by me and considered in my medical decision making (see chart for details).    MDM Rules/Calculators/A&P                           Patient known to have pretty significant COPD.  On her 3 L of oxygen here nasal cannula her oxygen sats are 94%.  Patient not moving air very well patient also with some wheezing.  Will give albuterol Atrovent nebulizer here we will give a increased dose of Solu-Medrol 125 mg.  If patient clears well enough to go home will probably need to be on 40 mg of prednisone for the next 5 days and then follow-up with her doctors.  Labs show blood sugars at 328 CO2 was 33 probably compensation for COPD.  Potassium 2.8.  Patient be given 40 mill equivalent potassium orally.  Marked leukocytosis with a white count of 18,000 but is usually is up for her that may be secondary to the chronic steroids.  Initial troponin is 10 repeats not really required because the chest pains been present for about a week.  Chest x-ray no acute process.  EKG was tachycardic.  Based on the brought area of left-sided chest wall pain and the tachycardia.  Patient's GFR is 48.  We will go ahead and do CT angio chest just to make sure that there is no pulmonary embolus.  And also to rule out any infiltrate since patient has had upper respiratory congestion.  And COVID and influenza testing is pending.  Patient will then require reassessment.  If she is able to be discharged home would do oral prednisone and potassium supplement and then follow-up with her doctor.   Final Clinical Impression(s) / ED Diagnoses Final diagnoses:  COPD exacerbation ([place])  Upper respiratory tract infection, unspecified type  Chest wall pain    Rx / DC Orders ED Discharge Orders     None        Fredia Sorrow, MD 10/29/21 1524

## 2021-10-30 ENCOUNTER — Telehealth: Payer: Self-pay | Admitting: Emergency Medicine

## 2021-10-30 NOTE — Telephone Encounter (Signed)
I have attempted to call the pt several times and cannot get through and no VM picks up.  Will try back later.

## 2021-11-01 ENCOUNTER — Encounter: Payer: Self-pay | Admitting: Emergency Medicine

## 2021-11-01 ENCOUNTER — Ambulatory Visit (INDEPENDENT_AMBULATORY_CARE_PROVIDER_SITE_OTHER): Payer: Medicare Other | Admitting: Emergency Medicine

## 2021-11-01 ENCOUNTER — Other Ambulatory Visit: Payer: Self-pay

## 2021-11-01 ENCOUNTER — Telehealth: Payer: Self-pay | Admitting: Emergency Medicine

## 2021-11-01 DIAGNOSIS — J411 Mucopurulent chronic bronchitis: Secondary | ICD-10-CM

## 2021-11-01 MED ORDER — AMOXICILLIN-POT CLAVULANATE 875-125 MG PO TABS: 1.0000 | ORAL_TABLET | Freq: Two times a day (BID) | ORAL | 0 refills | Status: AC

## 2021-11-01 NOTE — Telephone Encounter (Signed)
Says flutter valve expected to be at pharmacy but they didn't have it See if we can get her one thru her DME when office opens 11/04/21

## 2021-11-01 NOTE — Progress Notes (Signed)
Virtual Visit via Telephone Note  I connected with Cindy [name] on 11/01/21 at 10:15 AM EDT by telephone and verified that I am speaking with the correct person using two identifiers.  Location: Patient: Home Provider: Office   I discussed the limitations, risks, security and privacy concerns of performing an evaluation and management service by telephone and the availability of in person appointments. I also discussed with the patient that there may be a patient responsible charge related to this service. The patient expressed understanding and agreed to proceed.   History of Present Illness: Follow-up telephone visit for 65 year old active smoker with severe COPD and chronic bronchitic phenotype, frequent exacerbations for which she is frequently treated with prednisone and antibiotics.  She has associated hypoxemic respiratory failure.  She is on chronic prednisone 10 mg daily PMH also significant for hypertension and CAD, chronic rhinitis.   Observations/Objective: Currently managed on Spiriva, Symbicort. She is using albuterol about bid.  Remains on oxygen, stable flow rate.  She reports stable SOB, increased chest congestion. She was in the ED 11/1 for dyspnea and itrching, was given a pred taper.  She is dealing with pain - flank and back pain. She is being evaluated Dr Diona Fanti for renal calculi, gallbladder echogenic mass. She is being referred to Dr Arnoldo Morale for evaluation of this. Uncl;ear what she will need, bx vs resection.    Assessment and Plan: -will add augmentin bid x 7 days.  -continue her current regimen including pred 10mg  after she completes recent taper.  -needs smoking cessation -she questions whether she would benefit more from changing her BD over to nebs only - could consider going to duopneb qid. For now I would like to stay on long-acting BD's  She is at high risk for anesthesia, prolonged hospitalization or ventilation if she needs to undergo surgery for  her gallbladder mass. Would need to take into account, assess risks vs benefits as she prepares for any surgery.   Follow Up Instructions: Follow w Dr Lamonte Sakai 2 months   I discussed the assessment and treatment plan with the patient. The patient was provided an opportunity to ask questions and all were answered. The patient agreed with the plan and demonstrated an understanding of the instructions.   The patient was advised to call back or seek an in-person evaluation if the symptoms worsen or if the condition fails to improve as anticipated.  I provided 25 minutes of non-face-to-face time during this encounter.   Collene Gobble, MD

## 2021-11-03 ENCOUNTER — Encounter (HOSPITAL_COMMUNITY): Payer: Self-pay | Admitting: *Deleted

## 2021-11-03 ENCOUNTER — Emergency Department (HOSPITAL_COMMUNITY): Payer: Medicare Other

## 2021-11-03 ENCOUNTER — Emergency Department (HOSPITAL_COMMUNITY)
Admission: EM | Admit: 2021-11-03 | Discharge: 2021-11-03 | Disposition: A | Discharge status: Home / Self Care | Payer: Medicare Other | Attending: Emergency Medicine | Admitting: Emergency Medicine

## 2021-11-03 DIAGNOSIS — R0602 Shortness of breath: Secondary | ICD-10-CM | POA: Diagnosis not present

## 2021-11-03 DIAGNOSIS — R1900 Intra-abdominal and pelvic swelling, mass and lump, unspecified site: Secondary | ICD-10-CM | POA: Diagnosis not present

## 2021-11-03 DIAGNOSIS — I2510 Atherosclerotic heart disease of native coronary artery without angina pectoris: Secondary | ICD-10-CM | POA: Diagnosis not present

## 2021-11-03 DIAGNOSIS — R0789 Other chest pain: Secondary | ICD-10-CM | POA: Diagnosis not present

## 2021-11-03 DIAGNOSIS — M546 Pain in thoracic spine: Secondary | ICD-10-CM | POA: Diagnosis not present

## 2021-11-03 DIAGNOSIS — R000 Tachycardia, unspecified: Secondary | ICD-10-CM | POA: Insufficient documentation

## 2021-11-03 DIAGNOSIS — F17210 Nicotine dependence, cigarettes, uncomplicated: Secondary | ICD-10-CM | POA: Insufficient documentation

## 2021-11-03 DIAGNOSIS — I5032 Chronic diastolic (congestive) heart failure: Secondary | ICD-10-CM | POA: Insufficient documentation

## 2021-11-03 DIAGNOSIS — R051 Acute cough: Secondary | ICD-10-CM

## 2021-11-03 DIAGNOSIS — R059 Cough, unspecified: Secondary | ICD-10-CM | POA: Diagnosis not present

## 2021-11-03 DIAGNOSIS — Z7951 Long term (current) use of inhaled steroids: Secondary | ICD-10-CM | POA: Diagnosis not present

## 2021-11-03 DIAGNOSIS — I110 Hypertensive heart disease with heart failure: Secondary | ICD-10-CM | POA: Insufficient documentation

## 2021-11-03 DIAGNOSIS — R062 Wheezing: Secondary | ICD-10-CM | POA: Insufficient documentation

## 2021-11-03 DIAGNOSIS — Z79899 Other long term (current) drug therapy: Secondary | ICD-10-CM | POA: Insufficient documentation

## 2021-11-03 DIAGNOSIS — R2243 Localized swelling, mass and lump, lower limb, bilateral: Secondary | ICD-10-CM | POA: Insufficient documentation

## 2021-11-03 DIAGNOSIS — J449 Chronic obstructive pulmonary disease, unspecified: Secondary | ICD-10-CM | POA: Insufficient documentation

## 2021-11-03 DIAGNOSIS — J439 Emphysema, unspecified: Secondary | ICD-10-CM | POA: Diagnosis not present

## 2021-11-03 LAB — CBC
HCT: 41.1 % (ref 36.0–46.0)
Hemoglobin: 13.7 g/dL (ref 12.0–15.0)
MCH: 29.6 pg (ref 26.0–34.0)
MCHC: 33.3 g/dL (ref 30.0–36.0)
MCV: 88.8 fL (ref 80.0–100.0)
Platelets: 308 10*3/uL (ref 150–400)
RBC: 4.63 MIL/uL (ref 3.87–5.11)
RDW: 17 % — ABNORMAL HIGH (ref 11.5–15.5)
WBC: 17.8 10*3/uL — ABNORMAL HIGH (ref 4.0–10.5)
nRBC: 0 % (ref 0.0–0.2)

## 2021-11-03 LAB — COMPREHENSIVE METABOLIC PANEL
ALT: 105 U/L — ABNORMAL HIGH (ref 0–44)
AST: 91 U/L — ABNORMAL HIGH (ref 15–41)
Albumin: 3.6 g/dL (ref 3.5–5.0)
Alkaline Phosphatase: 133 U/L — ABNORMAL HIGH (ref 38–126)
Anion gap: 10 (ref 5–15)
BUN: 26 mg/dL — ABNORMAL HIGH (ref 8–23)
CO2: 29 mmol/L (ref 22–32)
Calcium: 9.4 mg/dL (ref 8.9–10.3)
Chloride: 91 mmol/L — ABNORMAL LOW (ref 98–111)
Creatinine, Ser: 1.19 mg/dL — ABNORMAL HIGH (ref 0.44–1.00)
GFR, Estimated: 51 mL/min — ABNORMAL LOW (ref >60)
Glucose, Bld: 335 mg/dL — ABNORMAL HIGH (ref 70–99)
Potassium: 4.7 mmol/L (ref 3.5–5.1)
Sodium: 130 mmol/L — ABNORMAL LOW (ref 135–145)
Total Bilirubin: 0.7 mg/dL (ref 0.3–1.2)
Total Protein: 6.7 g/dL (ref 6.5–8.1)

## 2021-11-03 MED ORDER — OXYCODONE-ACETAMINOPHEN 5-325 MG PO TABS: 1.0000 | ORAL_TABLET | Freq: Four times a day (QID) | ORAL | 0 refills | Status: DC | PRN

## 2021-11-03 MED ORDER — PREDNISONE 20 MG PO TABS: 40.0000 mg | ORAL_TABLET | Freq: Every day | ORAL | 0 refills | Status: AC

## 2021-11-03 MED ORDER — METHYLPREDNISOLONE SODIUM SUCC 125 MG IJ SOLR
125.0000 mg | Freq: Once | INTRAMUSCULAR | Status: AC
  Administered 2021-11-03: 125 mg via INTRAVENOUS
  Filled 2021-11-03: qty 2

## 2021-11-03 MED ORDER — IPRATROPIUM-ALBUTEROL 0.5-2.5 (3) MG/3ML IN SOLN
3.0000 mL | Freq: Once | RESPIRATORY_TRACT | Status: AC
  Administered 2021-11-03: 3 mL via RESPIRATORY_TRACT
  Filled 2021-11-03: qty 3

## 2021-11-03 MED ORDER — MORPHINE SULFATE (PF) 4 MG/ML IV SOLN
4.0000 mg | Freq: Once | INTRAVENOUS | Status: AC
Start: 2021-11-03 — End: 2021-11-03
  Administered 2021-11-03: 4 mg via INTRAVENOUS
  Filled 2021-11-03: qty 1

## 2021-11-03 MED ORDER — FUROSEMIDE 10 MG/ML IJ SOLN
80.0000 mg | Freq: Once | INTRAMUSCULAR | Status: AC
  Administered 2021-11-03: 80 mg via INTRAVENOUS
  Filled 2021-11-03: qty 8

## 2021-11-03 NOTE — ED Triage Notes (Signed)
Back pain with congestion

## 2021-11-03 NOTE — ED Notes (Signed)
This RN unable to start an IV. Another RN will attempt

## 2021-11-03 NOTE — ED Provider Notes (Signed)
with history significant for coronary artery disease, diabetes, COPD [facility] EMERGENCY DEPARTMENT Provider Note   CSN: [id number] Arrival date & time: 11/03/21  1644     History Chief Complaint  Patient presents with   Back Pain    Cindy [name] is a 65 y.o. female.  This is 65 year old female presents with a past medical history of CAD, COPD, diastolic congestive heart failure, pulmonary hypertension presents to the ED with chief complaint of cough along with left lateral chest wall pain.  Patient on 3 L nasal cannula at all times at home.  She takes 10 mg prednisone daily, however she is supposed to increase that dose to 20 mg although patient denies taking the recommended increased dose. Patient reports symptoms started about 1 week prior she was seen in the ED at that time and symptoms were determined to be result of acute COPD exacerbation.  She had some mild improvement in symptoms with breathing treatments.  CTA was negative for PE.  She reports that her left lateral chest wall pain has increased and now hurts every time that she coughs or touches the area.  Denies known trauma.  There is bruising on the affected area.  Patient also complains of swelling in her legs and stomach which is normal for her although seems to be worse today.  Patient denies fever, chills, headaches.  The history is provided by the patient.  Back Pain Location:  Thoracic spine Quality: sharp. Pain severity:  Severe Pain is:  Same all the time Duration:  1 week Progression:  Worsening Chronicity:  New Relieved by:  Nothing Worsened by:  Coughing Associated symptoms: abdominal swelling   Associated symptoms: no abdominal pain, no chest pain, no dysuria, no fever, no headaches, no leg pain and no weakness   Risk factors: steroid use       Past Medical History:  Diagnosis Date   Adrenal adenoma 2014   Left   Arthritis    CAD (coronary artery disease)    a. NSTEMI 07/2018 s/p difficult PCI with  overlapping DES to LAD, residual OM2 disease treated medically.   Chronic diastolic CHF (congestive heart failure) (HCC)    Chronic pain    Chronic respiratory failure (HCC) On home 02 2-3L   Compression fracture of lumbar vertebra (HCC)    COPD (chronic obstructive pulmonary disease) ([place])    Essential hypertension    Hyperglycemia, drug-induced    Hyperlipidemia    Kidney stones 2014   Left side, multiple   Leukocytosis    Myocardial infarct ([place])    Osteoporosis 2013   Thyroid disease    pt denies   Tobacco abuse     Patient Active Problem List   Diagnosis Date Noted   Secondary hyperparathyroidism ([place]) 01/25/2021   Stucco keratoses 09/27/2019   Diabetes mellitus without complication ([place]) 88/91/6945   Chronic pain 11/18/2018   CAD (coronary artery disease) 10/05/2018   Chronic diastolic CHF (congestive heart failure) (HCC)    S/P PTCA (percutaneous transluminal coronary angioplasty)    Orthopnea    H/O non-ST elevation myocardial infarction (NSTEMI)    Primary hypertension    Abnormal EKG 03/88/8280   Diastolic dysfunction 03/49/1791   Pulmonary hypertension ([place]) 05/01/2016   Rash and nonspecific skin eruption 08/15/2014   Ureteral stone with hydronephrosis 05/30/2013   Allergic rhinitis 04/11/2013   Lumbar back pain 03/03/2013   Compression fracture of L3 lumbar vertebra 03/03/2013   Constipation 03/03/2013   Osteoporosis 03/16/2012   GERD (  gastroesophageal reflux disease) 03/16/2012   Thrush 03/02/2012   Chronic hypoxemic respiratory failure ([place]) 02/27/2012   Hyperlipidemia 02/25/2012   Tobacco abuse 09/24/2011   COPD (chronic obstructive pulmonary disease) ([place]) 12/24/2009    Past Surgical History:  Procedure Laterality Date   CERVICAL BIOPSY     COLONOSCOPY N/A 03/31/2013   Procedure: COLONOSCOPY;  Surgeon: Rogene Houston, MD;  Location: [facility];  Service: Endoscopy;  Laterality: N/A;  730   CORONARY ATHERECTOMY N/A 08/19/2018   Procedure:  CORONARY ATHERECTOMY;  Surgeon: Nelva Bush, MD;  Location: [facility] CV LAB;  Service: Cardiovascular;  Laterality: N/A;   CORONARY STENT INTERVENTION N/A 08/19/2018   Procedure: CORONARY STENT INTERVENTION;  Surgeon: Nelva Bush, MD;  Location: [place] CV LAB;  Service: Cardiovascular;  Laterality: N/A;   INTRAVASCULAR ULTRASOUND/IVUS N/A 08/19/2018   Procedure: Intravascular Ultrasound/IVUS;  Surgeon: Nelva Bush, MD;  Location: [place] CV LAB;  Service: Cardiovascular;  Laterality: N/A;   LEFT HEART CATH N/A 08/19/2018   Procedure: Left Heart Cath;  Surgeon: Nelva Bush, MD;  Location: [place] CV LAB;  Service: Cardiovascular;  Laterality: N/A;   LEFT HEART CATH AND CORONARY ANGIOGRAPHY N/A 08/16/2018   Procedure: LEFT HEART CATH AND CORONARY ANGIOGRAPHY;  Surgeon: Nelva Bush, MD;  Location: [place] [facility] LAB;  Service: Cardiovascular;  Laterality: N/A;   TUBAL LIGATION       OB History   No obstetric history on file.     Family History  Problem Relation Age of Onset   Heart disease Mother    Hypertension Sister    Hypertension Brother     Social History   Tobacco Use   Smoking status: Some Days    Packs/day: 1.00    Years: 25.00    Pack years: 25.00    Types: Cigarettes   Smokeless tobacco: Never   Tobacco comments:    2-5 cigarettes smoked daily 09/03/21 ep  Vaping Use   Vaping Use: Never used  Substance Use Topics   Alcohol use: No   Drug use: No    Home Medications Prior to Admission medications   Medication Sig Start Date End Date Taking? Authorizing Provider  albuterol (PROVENTIL) (2.5 MG/3ML) 0.083% nebulizer solution Take 3 mLs (2.5 mg total) by nebulization every 4 (four) hours as needed for wheezing or shortness of breath. Dx: J44.9- COPD. 09/09/21   Collene Gobble, MD  albuterol (VENTOLIN HFA) 108 (90 Base) MCG/ACT inhaler Inhale 2 puffs into the lungs every 4 (four) hours as needed for wheezing or shortness of breath.  05/30/21   Lindell Spar, MD  alendronate (FOSAMAX) 70 MG tablet TAKE 1 TAB BY MOUTH EVERY WEEK ON AN EMPTY STOMACH. 05/16/21   Lindell Spar, MD  amoxicillin-clavulanate (AUGMENTIN) 875-125 MG tablet Take 1 tablet by mouth 2 (two) times daily for 7 days. 11/01/21 11/08/21  Collene Gobble, MD  atorvastatin (LIPITOR) 80 MG tablet TAKE 1 TABLET BY MOUTH DAILY 01/15/21   Ahmed Prima, [place] M, PA-C  Blood Glucose Monitoring Suppl (BLOOD GLUCOSE SYSTEM PAK) KIT Please dispense based on patient and insurance preference. Use as directed to monitor FSBS 1x daily. Dx: E11.9. 09/29/19   Alycia Rossetti, MD  budesonide-formoterol [facility]) 160-4.5 MCG/ACT inhaler Inhale 2 puffs into the lungs 2 (two) times daily. 05/06/21   Lindell Spar, MD  clopidogrel (PLAVIX) 75 MG tablet Take 1 tablet (75 mg total) by mouth daily. 05/06/21   Lindell Spar, MD  doxycycline (VIBRA-TABS) 100  MG tablet Take 1 tablet (100 mg total) by mouth 2 (two) times daily. Patient not taking: Reported on 10/29/2021 09/09/21   Collene Gobble, MD  ezetimibe (ZETIA) 10 MG tablet TAKE 1 TABLET(10 MG) BY MOUTH DAILY 04/15/21   Ahmed Prima, [place] M, PA-C  furosemide (LASIX) 40 MG tablet ALTERNATE TAKING 2 TABLETS ONE DAY THEN 1 TABLET NEXT DAY AND REPEAT 04/18/21   Lindell Spar, MD  glipiZIDE (GLUCOTROL) 10 MG tablet Take 1 tablet (10 mg total) by mouth 2 (two) times daily before a meal. 02/27/21   [place], Modena Nunnery, MD  Glucose Blood (BLOOD GLUCOSE TEST STRIPS) STRP Please dispense based on patient and insurance preference. Use as directed to monitor FSBS 1x daily. Dx: E11.9. 09/29/19   Alycia Rossetti, MD  guaiFENesin-codeine 100-10 MG/5ML syrup Take 10 mLs by mouth every 6 (six) hours as needed for cough. 09/05/21   Veryl Speak, MD  ipratropium (ATROVENT) 0.03 % nasal spray Place 2 sprays into both nostrils every 12 (twelve) hours. 04/04/21   Lindell Spar, MD  Lancets MISC Please dispense based on patient and insurance preference. Use as  directed to monitor FSBS 1x daily. Dx: E11.9. 04/18/21   Lindell Spar, MD  LINZESS 145 MCG CAPS capsule TAKE 1 CAPSULE BY MOUTH BEFORE BREAKFAST. Patient not taking: Reported on 10/29/2021 02/13/21   Alycia Rossetti, MD  metoprolol tartrate (LOPRESSOR) 25 MG tablet Take 1 tablet (25 mg total) by mouth 2 (two) times daily. 02/13/21   , Modena Nunnery, MD  nitroGLYCERIN (NITROSTAT) 0.4 MG SL tablet Place 1 tablet (0.4 mg total) under the tongue every 5 (five) minutes as needed for chest pain. If no improvement with 3 doses call 911 08/27/18   Delsa Grana, PA-C  nystatin (MYCOSTATIN) 100000 UNIT/ML suspension Use as directed 5 mLs (500,000 Units total) in the mouth or throat 4 (four) times daily. Swish and swallow 03/11/21   Lindell Spar, MD  OXYGEN Inhale 3 L into the lungs continuous.     [provider]  pantoprazole (PROTONIX) 40 MG tablet TAKE 1 TABLET(40 MG) BY MOUTH TWICE DAILY Patient taking differently: Take 40 mg by mouth 2 (two) times daily. 06/04/21   Lindell Spar, MD  polyethylene glycol [facility] / Floria Raveling) packet Take 17 g by mouth daily. 06/04/18   Varney Biles, MD  potassium chloride SA (KLOR-CON) 20 MEQ tablet Take 1 tablet (20 mEq total) by mouth daily for 3 days. Patient not taking: No sig reported 08/28/20 06/27/21  Alycia Rossetti, MD  potassium chloride SA (KLOR-CON) 20 MEQ tablet Take 1 tablet (20 mEq total) by mouth 2 (two) times daily. 10/29/21   Fredia Sorrow, MD  predniSONE (DELTASONE) 10 MG tablet Take 76m once a day 02/18/21   DAlycia Rossetti MD  predniSONE (DELTASONE) 10 MG tablet Take 4 tablets (40 mg total) by mouth daily. 10/29/21   ZFredia Sorrow MD  spironolactone (ALDACTONE) 25 MG tablet Take 1 tablet (25 mg total) by mouth daily. 05/31/21   PLindell Spar MD  Tiotropium Bromide Monohydrate (SPIRIVA RESPIMAT) 2.5 MCG/ACT AERS Inhale 2 puffs into the lungs daily. 02/22/21   DAlycia Rossetti MD    Allergies    Keflex [cephalexin], Losartan,  Sulfa antibiotics, Avelox [moxifloxacin hcl in nacl], and Toradol [ketorolac tromethamine]  Review of Systems   Review of Systems  Constitutional:  Negative for fever.  HENT:  Negative for sore throat.   Eyes:  Negative for redness and visual disturbance.  Respiratory:  Positive for cough. Negative for chest tightness.   Cardiovascular:  Negative for chest pain.  Gastrointestinal:  Negative for abdominal pain, diarrhea and vomiting.  Endocrine: Negative.   Genitourinary: Negative.  Negative for dysuria.  Musculoskeletal:  Positive for back pain. Negative for myalgias.  Skin: Negative.  Negative for wound.  Neurological: Negative.  Negative for weakness and headaches.  Psychiatric/Behavioral: Negative.    All other systems reviewed and are negative.  Physical Exam Updated Vital Signs BP (!) 133/108   Pulse (!) 107   Temp 97.9 F (36.6 C) (Oral)   Resp 19   SpO2 96%   Physical Exam Vitals and nursing note reviewed.  Constitutional:      General: She is in acute distress.     Appearance: She is obese. She is ill-appearing.     Comments: This is an obese ill-appearing elderly woman.  Sitting on her bed in obvious discomfort.  With increased work of breathing.  O2 at 90% with 3 L nasal cannula.  HENT:     Head: Atraumatic.     Nose: Nose normal.  Eyes:     General: Lids are normal.     Extraocular Movements: Extraocular movements intact.     Conjunctiva/sclera: Conjunctivae normal.  Cardiovascular:     Rate and Rhythm: Regular rhythm. Tachycardia present.     Pulses: Normal pulses.          Dorsalis pedis pulses are 2+ on the right side and 2+ on the left side.     Heart sounds: No murmur heard.    Comments: Patient has bilateral pitting edema with feet however significantly worse on the right side.  Patient has baseline edema though this seems worse today. Pulmonary:     Effort: Accessory muscle usage and prolonged expiration present. No respiratory distress.     Breath  sounds: Wheezing present.     Comments: Moderate to severe expiratory wheezes in all lung field; patient has wet sounding, nonproductive cough and exam Abdominal:     General: Abdomen is flat. There is distension.     Palpations: Abdomen is soft.     Tenderness: There is no abdominal tenderness.     Comments: Significant fluid distention throughout abdomen.  Musculoskeletal:        General: Normal range of motion.     Cervical back: Normal range of motion.       Back:     Right lower leg: 3+ Edema present.     Left lower leg: 2+ Pitting Edema present.     Comments: No obvious bony or musculoskeletal deformities on palpation.  Patient has mild bruising on the left lateral chest wall with moderate tenderness to palpation.  Skin:    General: Skin is warm and dry.     Capillary Refill: Capillary refill takes less than 2 seconds.  Neurological:     General: No focal deficit present.     Mental Status: She is alert.  Psychiatric:        Mood and Affect: Mood normal.    ED Results / Procedures / Treatments   Labs (all labs ordered are listed, but only abnormal results are displayed) Labs Reviewed  COMPREHENSIVE METABOLIC PANEL - Abnormal; Notable for the following components:      Result Value   Sodium 130 (*)    Chloride 91 (*)    Glucose, Bld 335 (*)    BUN 26 (*)    Creatinine, Ser 1.19 (*)  AST 91 (*)    ALT 105 (*)    Alkaline Phosphatase 133 (*)    GFR, Estimated 51 (*)    All other components within normal limits  CBC - Abnormal; Notable for the following components:   WBC 17.8 (*)    RDW 17.0 (*)    All other components within normal limits    EKG None  Radiology DG Chest Portable 1 View  Result Date: 11/03/2021 CLINICAL DATA:  Shortness of breath and cough. EXAM: PORTABLE CHEST 1 VIEW COMPARISON:  Chest radiograph dated 10/29/2021. FINDINGS: Background of emphysema and mild diffuse chronic interstitial coarsening. No focal consolidation, pleural effusion,  pneumothorax. The cardiac silhouette is within normal limits. Atherosclerotic calcification of the aorta. No acute osseous pathology. IMPRESSION: No active disease. Electronically Signed   By: Anner Crete M.D.   On: 11/03/2021 20:02    Procedures Procedures   Medications Ordered in ED Medications  furosemide (LASIX) injection 80 mg (has no administration in time range)  ipratropium-albuterol (DUONEB) 0.5-2.5 (3) MG/3ML nebulizer solution 3 mL (3 mLs Nebulization Given 11/03/21 2011)    ED Course  I have reviewed the triage vital signs and the nursing notes.  Pertinent labs & imaging results that were available during my care of the patient were reviewed by me and considered in my medical decision making (see chart for details).    MDM Rules/Calculators/A&P                         This is a 65 year old woman presenting to the ED with chief complaint of cough with left lateral wall chest pain.  On exam patient has increased respiratory effort.  With diffuse wheezing in all lung fields.  Symptoms not improved with single DuoNeb treatment.  Given second treatment on with 125 mg Solu-Medrol IV and 80 mg IV Lasix for diuresis.  Patient had improvement with breathing however continues to complain of chest wall pain.  Given 4 mg morphine IV for pain with moderate improvement.  Labs ordered in ED: CMP: Glucose 335.  Potassium normal today, improved from last week. CBC.  Elevated white blood cell count consistent with chronic steroid use.  Imaging ordered in ED: Chest x-ray negative for infiltrate or consolidation.  All labs and imaging reviewed and interpreted by me.  Patient likely having acute COPD exacerbation.  Patient reports having a certain level of respiratory distress at baseline, and believes she was near that baseline after her 2 DuoNeb treatments.  She is on home O2 already and she was maintaining oxygen sat 98% on room air.  Patient remained tachycardic with heart rates around  110 bpm which is likely from her chest wall pain and respiratory effort.  Patient was discharged home with few days of Percocet to help manage her chest wall pain which she insists is the primary problem.  Additionally patient was prescribed prednisone 40 mg to take for the next 5 days starting tomorrow before returning to her usual 10 mg dose.  Directed to return if shortness of breath worsens.  Final Clinical Impression(s) / ED Diagnoses Final diagnoses:  Acute cough    Rx / DC Orders ED Discharge Orders          Ordered    oxyCODONE-acetaminophen (PERCOCET/ROXICET) 5-325 MG tablet  Every 6 hours PRN        11/03/21 2158    predniSONE (DELTASONE) 20 MG tablet  Daily        11/03/21 2201  Tonye Pearson, [place] 11/03/21 2346    Noemi Chapel, MD 11/07/21 1255

## 2021-11-03 NOTE — Discharge Instructions (Addendum)
You were diagnosed today with an acute exacerbation of COPD with your left sided chest wall pain likely caused by ongoing cough. You responded well to the Duoneb treatments given today.   I sent in a prescription for Prednisone 40mg  that you will take for five days starting tomorrow. I have also given you a fews days worth of pain medication for your pain, but please follow up with your PCP if you require more. If your shortness of breath worsens or breathing becomes too difficult, please return to the ED for re-evaluation.

## 2021-11-03 NOTE — ED Provider Notes (Signed)
Medical screening examination/treatment/procedure(s) were conducted as a shared visit with non-physician practitioner(s) and myself.  I personally evaluated the patient during the encounter.  Clinical Impression:   Final diagnoses:  None      This patient is a chronically ill 65 year old female, she has known severe COPD on 3 L by nasal cannula chronically.  She has recently been worked up and evaluated for the cause of left-sided chest discomfort or shortness of breath, including a CT angiogram of the chest performed 4 days ago,  On exam the patient has diffuse expiratory wheezing and tachypnea, she is tachycardic to 115, she has tenderness to palpation over the left chest wall without crepitance or subcutaneous emphysema, no signs of shingles, no edema of the legs of any concern.  Needs pain medicine steroids more albuterol treatments but may need admission for respiratory distress due to decompensated COPD if she doesn't improve  Thanksfully improved significantly - has pain control issues that PCP will need to address.Noemi Chapel, MD 11/07/21 1256

## 2021-11-04 NOTE — Telephone Encounter (Signed)
Patient is returning phone call. Patient phone number is [phone].

## 2021-11-04 NOTE — Telephone Encounter (Signed)
Called patient but she did not answer. Left message for her to call back.  

## 2021-11-04 NOTE — Telephone Encounter (Signed)
Cindy [name] contacted patient to switch televisit to video visit. Called patient back- she went to the ED last night and cannot come in tomorrow. Patient does not have a resources to do [employer] visit.   Patient wanted to know about the flutter value that was called in by Dr. Melvyn Novas. How to use it, how much it was, etc.  If Dr. Lamonte Sakai cannot do a televisit tomorrow morning- patient will need to reschedule.  Please advise.

## 2021-11-05 ENCOUNTER — Ambulatory Visit (INDEPENDENT_AMBULATORY_CARE_PROVIDER_SITE_OTHER): Payer: Medicare Other | Admitting: Emergency Medicine

## 2021-11-05 ENCOUNTER — Other Ambulatory Visit: Payer: Self-pay

## 2021-11-05 ENCOUNTER — Encounter: Payer: Self-pay | Admitting: Emergency Medicine

## 2021-11-05 DIAGNOSIS — J411 Mucopurulent chronic bronchitis: Secondary | ICD-10-CM | POA: Diagnosis not present

## 2021-11-05 NOTE — Telephone Encounter (Signed)
Done

## 2021-11-05 NOTE — Telephone Encounter (Signed)
Patient would like a flutter valve. Patient phone number is [phone].

## 2021-11-05 NOTE — Progress Notes (Signed)
Virtual Visit via Telephone Note  I connected with Cindy [name] on 11/05/21 at 10:15 AM EST by telephone and verified that I am speaking with the correct person using two identifiers.  Location: Patient: Home Provider: Office   I discussed the limitations, risks, security and privacy concerns of performing an evaluation and management service by telephone and the availability of in person appointments. I also discussed with the patient that there may be a patient responsible charge related to this service. The patient expressed understanding and agreed to proceed.   History of Present Illness: Follow-up telephone visit for 65 year old active smoker with severe COPD and chronic bronchitic phenotype, frequent exacerbations for which she is frequently treated with prednisone and antibiotics.  She has associated hypoxemic respiratory failure.  She is on chronic prednisone 10 mg daily PMH also significant for hypertension and CAD, chronic rhinitis.   Observations/Objective: I gave her Augmentin last week. Her cough has been better.  She reports that she was in the ED on 11/6 for dyspnea, chest pain, flank pain and renal stones.  She was treated w solumedrol, lasix, pain meds.   Assessment and Plan: -No plans to change her scheduled BD at this time > symbicort and spiriva, albuterol. Pred 10mg  qd. I would prefer for her to be on long acting meds.   Follow Up Instructions: RB or APP in 1 month. Needs to be seen in person.    I discussed the assessment and treatment plan with the patient. The patient was provided an opportunity to ask questions and all were answered. The patient agreed with the plan and demonstrated an understanding of the instructions.   The patient was advised to call back or seek an in-person evaluation if the symptoms worsen or if the condition fails to improve as anticipated.  I provided 25 minutes of non-face-to-face time during this encounter.   Collene Gobble, MD

## 2021-11-05 NOTE — Addendum Note (Signed)
Addended by: Gavin Potters R on: 11/05/2021 05:09 PM   Modules accepted: Orders

## 2021-11-05 NOTE — Telephone Encounter (Signed)
Patient called in after televisit today stating she would like a flutter valve.   Dr. Lamonte Sakai please advise if you are ok with this

## 2021-11-07 ENCOUNTER — Ambulatory Visit: Payer: Medicare Other | Admitting: General Surgery

## 2021-11-13 ENCOUNTER — Other Ambulatory Visit: Payer: Self-pay | Admitting: Family Medicine

## 2021-11-13 DIAGNOSIS — K219 Gastro-esophageal reflux disease without esophagitis: Secondary | ICD-10-CM

## 2021-11-14 ENCOUNTER — Other Ambulatory Visit: Payer: Self-pay | Admitting: Family Medicine

## 2021-11-14 DIAGNOSIS — K219 Gastro-esophageal reflux disease without esophagitis: Secondary | ICD-10-CM

## 2021-11-14 NOTE — Telephone Encounter (Signed)
Leigh, this encounter can be closed but you have incomplete messages. Can you close this encounter please?

## 2021-11-18 ENCOUNTER — Encounter: Payer: Medicare Other | Admitting: Obstetrics & Gynecology

## 2021-11-20 ENCOUNTER — Other Ambulatory Visit: Payer: Self-pay | Admitting: Family Medicine

## 2021-11-28 ENCOUNTER — Ambulatory Visit: Payer: Medicare Other | Admitting: General Surgery

## 2021-12-05 ENCOUNTER — Encounter: Payer: Medicare Other | Admitting: Obstetrics & Gynecology

## 2021-12-14 ENCOUNTER — Encounter (HOSPITAL_COMMUNITY): Payer: Self-pay

## 2021-12-14 ENCOUNTER — Inpatient Hospital Stay (HOSPITAL_COMMUNITY)
Admission: EM | Admit: 2021-12-14 | Discharge: 2021-12-16 | DRG: 190 | Disposition: A | Discharge status: Home Health | Payer: Medicare Other | Attending: Internal Medicine | Admitting: Internal Medicine

## 2021-12-14 ENCOUNTER — Other Ambulatory Visit: Payer: Self-pay

## 2021-12-14 ENCOUNTER — Emergency Department (HOSPITAL_COMMUNITY): Payer: Medicare Other

## 2021-12-14 DIAGNOSIS — I252 Old myocardial infarction: Secondary | ICD-10-CM | POA: Diagnosis not present

## 2021-12-14 DIAGNOSIS — F17210 Nicotine dependence, cigarettes, uncomplicated: Secondary | ICD-10-CM | POA: Diagnosis present

## 2021-12-14 DIAGNOSIS — R0902 Hypoxemia: Secondary | ICD-10-CM | POA: Diagnosis not present

## 2021-12-14 DIAGNOSIS — M1990 Unspecified osteoarthritis, unspecified site: Secondary | ICD-10-CM | POA: Diagnosis present

## 2021-12-14 DIAGNOSIS — E782 Mixed hyperlipidemia: Secondary | ICD-10-CM | POA: Diagnosis present

## 2021-12-14 DIAGNOSIS — E669 Obesity, unspecified: Secondary | ICD-10-CM | POA: Diagnosis present

## 2021-12-14 DIAGNOSIS — R059 Cough, unspecified: Secondary | ICD-10-CM | POA: Diagnosis not present

## 2021-12-14 DIAGNOSIS — Z20822 Contact with and (suspected) exposure to covid-19: Secondary | ICD-10-CM | POA: Diagnosis present

## 2021-12-14 DIAGNOSIS — J441 Chronic obstructive pulmonary disease with (acute) exacerbation: Secondary | ICD-10-CM | POA: Diagnosis not present

## 2021-12-14 DIAGNOSIS — J9621 Acute and chronic respiratory failure with hypoxia: Secondary | ICD-10-CM | POA: Diagnosis not present

## 2021-12-14 DIAGNOSIS — L8990 Pressure ulcer of unspecified site, unspecified stage: Secondary | ICD-10-CM | POA: Insufficient documentation

## 2021-12-14 DIAGNOSIS — D72829 Elevated white blood cell count, unspecified: Secondary | ICD-10-CM

## 2021-12-14 DIAGNOSIS — Z9981 Dependence on supplemental oxygen: Secondary | ICD-10-CM

## 2021-12-14 DIAGNOSIS — I272 Pulmonary hypertension, unspecified: Secondary | ICD-10-CM | POA: Diagnosis present

## 2021-12-14 DIAGNOSIS — J439 Emphysema, unspecified: Secondary | ICD-10-CM | POA: Diagnosis not present

## 2021-12-14 DIAGNOSIS — Z888 Allergy status to other drugs, medicaments and biological substances status: Secondary | ICD-10-CM

## 2021-12-14 DIAGNOSIS — R7989 Other specified abnormal findings of blood chemistry: Secondary | ICD-10-CM

## 2021-12-14 DIAGNOSIS — T380X5A Adverse effect of glucocorticoids and synthetic analogues, initial encounter: Secondary | ICD-10-CM | POA: Diagnosis present

## 2021-12-14 DIAGNOSIS — K219 Gastro-esophageal reflux disease without esophagitis: Secondary | ICD-10-CM | POA: Diagnosis present

## 2021-12-14 DIAGNOSIS — E785 Hyperlipidemia, unspecified: Secondary | ICD-10-CM | POA: Diagnosis present

## 2021-12-14 DIAGNOSIS — K760 Fatty (change of) liver, not elsewhere classified: Secondary | ICD-10-CM | POA: Diagnosis not present

## 2021-12-14 DIAGNOSIS — I469 Cardiac arrest, cause unspecified: Secondary | ICD-10-CM | POA: Diagnosis not present

## 2021-12-14 DIAGNOSIS — I110 Hypertensive heart disease with heart failure: Secondary | ICD-10-CM | POA: Diagnosis not present

## 2021-12-14 DIAGNOSIS — I2510 Atherosclerotic heart disease of native coronary artery without angina pectoris: Secondary | ICD-10-CM | POA: Diagnosis not present

## 2021-12-14 DIAGNOSIS — Z79899 Other long term (current) drug therapy: Secondary | ICD-10-CM | POA: Diagnosis not present

## 2021-12-14 DIAGNOSIS — Z7983 Long term (current) use of bisphosphonates: Secondary | ICD-10-CM

## 2021-12-14 DIAGNOSIS — I5032 Chronic diastolic (congestive) heart failure: Secondary | ICD-10-CM | POA: Diagnosis not present

## 2021-12-14 DIAGNOSIS — Z7902 Long term (current) use of antithrombotics/antiplatelets: Secondary | ICD-10-CM | POA: Diagnosis not present

## 2021-12-14 DIAGNOSIS — Z882 Allergy status to sulfonamides status: Secondary | ICD-10-CM

## 2021-12-14 DIAGNOSIS — Z955 Presence of coronary angioplasty implant and graft: Secondary | ICD-10-CM | POA: Diagnosis not present

## 2021-12-14 DIAGNOSIS — Z6834 Body mass index (BMI) 34.0-34.9, adult: Secondary | ICD-10-CM

## 2021-12-14 DIAGNOSIS — I499 Cardiac arrhythmia, unspecified: Secondary | ICD-10-CM | POA: Diagnosis not present

## 2021-12-14 DIAGNOSIS — R404 Transient alteration of awareness: Secondary | ICD-10-CM | POA: Diagnosis not present

## 2021-12-14 DIAGNOSIS — L89312 Pressure ulcer of right buttock, stage 2: Secondary | ICD-10-CM | POA: Diagnosis present

## 2021-12-14 DIAGNOSIS — R7401 Elevation of levels of liver transaminase levels: Secondary | ICD-10-CM

## 2021-12-14 DIAGNOSIS — N2581 Secondary hyperparathyroidism of renal origin: Secondary | ICD-10-CM | POA: Diagnosis present

## 2021-12-14 DIAGNOSIS — J9620 Acute and chronic respiratory failure, unspecified whether with hypoxia or hypercapnia: Secondary | ICD-10-CM | POA: Diagnosis present

## 2021-12-14 DIAGNOSIS — R55 Syncope and collapse: Secondary | ICD-10-CM | POA: Diagnosis not present

## 2021-12-14 DIAGNOSIS — E119 Type 2 diabetes mellitus without complications: Secondary | ICD-10-CM | POA: Diagnosis not present

## 2021-12-14 DIAGNOSIS — R739 Hyperglycemia, unspecified: Secondary | ICD-10-CM

## 2021-12-14 DIAGNOSIS — R0689 Other abnormalities of breathing: Secondary | ICD-10-CM | POA: Diagnosis not present

## 2021-12-14 DIAGNOSIS — G8929 Other chronic pain: Secondary | ICD-10-CM | POA: Diagnosis present

## 2021-12-14 DIAGNOSIS — R000 Tachycardia, unspecified: Secondary | ICD-10-CM | POA: Diagnosis not present

## 2021-12-14 DIAGNOSIS — E1165 Type 2 diabetes mellitus with hyperglycemia: Secondary | ICD-10-CM | POA: Diagnosis present

## 2021-12-14 DIAGNOSIS — Z7951 Long term (current) use of inhaled steroids: Secondary | ICD-10-CM

## 2021-12-14 DIAGNOSIS — M810 Age-related osteoporosis without current pathological fracture: Secondary | ICD-10-CM | POA: Diagnosis present

## 2021-12-14 DIAGNOSIS — Z7984 Long term (current) use of oral hypoglycemic drugs: Secondary | ICD-10-CM | POA: Diagnosis not present

## 2021-12-14 DIAGNOSIS — R079 Chest pain, unspecified: Secondary | ICD-10-CM | POA: Diagnosis not present

## 2021-12-14 DIAGNOSIS — R0602 Shortness of breath: Secondary | ICD-10-CM | POA: Diagnosis not present

## 2021-12-14 DIAGNOSIS — Z881 Allergy status to other antibiotic agents status: Secondary | ICD-10-CM

## 2021-12-14 DIAGNOSIS — E871 Hypo-osmolality and hyponatremia: Secondary | ICD-10-CM

## 2021-12-14 DIAGNOSIS — I10 Essential (primary) hypertension: Secondary | ICD-10-CM

## 2021-12-14 LAB — COMPREHENSIVE METABOLIC PANEL
ALT: 91 U/L — ABNORMAL HIGH (ref 0–44)
AST: 80 U/L — ABNORMAL HIGH (ref 15–41)
Albumin: 3.5 g/dL (ref 3.5–5.0)
Alkaline Phosphatase: 134 U/L — ABNORMAL HIGH (ref 38–126)
Anion gap: 12 (ref 5–15)
BUN: 15 mg/dL (ref 8–23)
CO2: 32 mmol/L (ref 22–32)
Calcium: 8.7 mg/dL — ABNORMAL LOW (ref 8.9–10.3)
Chloride: 85 mmol/L — ABNORMAL LOW (ref 98–111)
Creatinine, Ser: 1.15 mg/dL — ABNORMAL HIGH (ref 0.44–1.00)
GFR, Estimated: 53 mL/min — ABNORMAL LOW (ref >60)
Glucose, Bld: 501 mg/dL (ref 70–99)
Potassium: 3.7 mmol/L (ref 3.5–5.1)
Sodium: 129 mmol/L — ABNORMAL LOW (ref 135–145)
Total Bilirubin: 0.7 mg/dL (ref 0.3–1.2)
Total Protein: 6.3 g/dL — ABNORMAL LOW (ref 6.5–8.1)

## 2021-12-14 LAB — BLOOD GAS, VENOUS
Acid-Base Excess: 8.9 mmol/L — ABNORMAL HIGH (ref 0.0–2.0)
Bicarbonate: 31 mmol/L — ABNORMAL HIGH (ref 20.0–28.0)
Drawn by: 1580
FIO2: 56
O2 Saturation: 87.9 %
Patient temperature: 36.9
pCO2, Ven: 57.5 mmHg (ref 44.0–60.0)
pH, Ven: 7.389 (ref 7.250–7.430)
pO2, Ven: 62.3 mmHg — ABNORMAL HIGH (ref 32.0–45.0)

## 2021-12-14 LAB — RESP PANEL BY RT-PCR (FLU A&B, COVID) ARPGX2
Influenza A by PCR: NEGATIVE
Influenza B by PCR: NEGATIVE
SARS Coronavirus 2 by RT PCR: NEGATIVE

## 2021-12-14 LAB — CBC WITH DIFFERENTIAL/PLATELET
Abs Immature Granulocytes: 0.29 10*3/uL — ABNORMAL HIGH (ref 0.00–0.07)
Basophils Absolute: 0.1 10*3/uL (ref 0.0–0.1)
Basophils Relative: 1 %
Eosinophils Absolute: 0 10*3/uL (ref 0.0–0.5)
Eosinophils Relative: 0 %
HCT: 40.5 % (ref 36.0–46.0)
Hemoglobin: 13.3 g/dL (ref 12.0–15.0)
Immature Granulocytes: 2 %
Lymphocytes Relative: 11 %
Lymphs Abs: 1.6 10*3/uL (ref 0.7–4.0)
MCH: 30 pg (ref 26.0–34.0)
MCHC: 32.8 g/dL (ref 30.0–36.0)
MCV: 91.2 fL (ref 80.0–100.0)
Monocytes Absolute: 1.1 10*3/uL — ABNORMAL HIGH (ref 0.1–1.0)
Monocytes Relative: 8 %
Neutro Abs: 11 10*3/uL — ABNORMAL HIGH (ref 1.7–7.7)
Neutrophils Relative %: 78 %
Platelets: 284 10*3/uL (ref 150–400)
RBC: 4.44 MIL/uL (ref 3.87–5.11)
RDW: 16.3 % — ABNORMAL HIGH (ref 11.5–15.5)
WBC: 14.1 10*3/uL — ABNORMAL HIGH (ref 4.0–10.5)
nRBC: 0 % (ref 0.0–0.2)

## 2021-12-14 LAB — TROPONIN I (HIGH SENSITIVITY)
Troponin I (High Sensitivity): 12 ng/L (ref <18)
Troponin I (High Sensitivity): 12 ng/L (ref <18)

## 2021-12-14 LAB — CBG MONITORING, ED: Glucose-Capillary: 406 mg/dL — ABNORMAL HIGH (ref 70–99)

## 2021-12-14 MED ORDER — METHYLPREDNISOLONE SODIUM SUCC 125 MG IJ SOLR
125.0000 mg | Freq: Once | INTRAMUSCULAR | Status: AC
  Administered 2021-12-14: 125 mg via INTRAVENOUS
  Filled 2021-12-14: qty 2

## 2021-12-14 MED ORDER — PREDNISONE 10 MG PO TABS: ORAL_TABLET | ORAL | 0 refills | Status: DC

## 2021-12-14 MED ORDER — MOMETASONE FURO-FORMOTEROL FUM 200-5 MCG/ACT IN AERO
2.0000 | INHALATION_SPRAY | Freq: Two times a day (BID) | RESPIRATORY_TRACT | Status: DC
  Administered 2021-12-14 – 2021-12-16 (×4): 2 via RESPIRATORY_TRACT
  Filled 2021-12-14: qty 8.8

## 2021-12-14 MED ORDER — LACTATED RINGERS IV BOLUS: 2.0000 mL | Freq: Once | INTRAVENOUS | Status: DC

## 2021-12-14 MED ORDER — POTASSIUM CHLORIDE ER 10 MEQ PO TBCR: 10.0000 meq | EXTENDED_RELEASE_TABLET | Freq: Every day | ORAL | 0 refills | Status: AC

## 2021-12-14 MED ORDER — LACTATED RINGERS IV BOLUS
1000.0000 mL | Freq: Once | INTRAVENOUS | Status: AC
  Administered 2021-12-14: 1000 mL via INTRAVENOUS

## 2021-12-14 MED ORDER — IPRATROPIUM BROMIDE 0.03 % NA SOLN
2.0000 | Freq: Two times a day (BID) | NASAL | Status: DC
  Administered 2021-12-14 – 2021-12-16 (×4): 2 via NASAL
  Filled 2021-12-14: qty 30

## 2021-12-14 MED ORDER — ALBUTEROL (5 MG/ML) CONTINUOUS INHALATION SOLN: 10.0000 mg/h | INHALATION_SOLUTION | Freq: Once | RESPIRATORY_TRACT | Status: DC

## 2021-12-14 MED ORDER — UMECLIDINIUM BROMIDE 62.5 MCG/ACT IN AEPB
2.0000 | INHALATION_SPRAY | Freq: Every day | RESPIRATORY_TRACT | Status: DC
  Administered 2021-12-14 – 2021-12-16 (×3): 2 via RESPIRATORY_TRACT
  Filled 2021-12-14: qty 7

## 2021-12-14 MED ORDER — AMOXICILLIN-POT CLAVULANATE 875-125 MG PO TABS: 1.0000 | ORAL_TABLET | Freq: Two times a day (BID) | ORAL | 0 refills | Status: DC

## 2021-12-14 MED ORDER — FENTANYL CITRATE PF 50 MCG/ML IJ SOSY
50.0000 ug | PREFILLED_SYRINGE | Freq: Once | INTRAMUSCULAR | Status: AC
  Administered 2021-12-14: 50 ug via INTRAVENOUS
  Filled 2021-12-14: qty 1

## 2021-12-14 MED ORDER — ALBUTEROL SULFATE (2.5 MG/3ML) 0.083% IN NEBU
INHALATION_SOLUTION | RESPIRATORY_TRACT | Status: AC
  Administered 2021-12-14: 2.5 mg
  Filled 2021-12-14: qty 3

## 2021-12-14 MED ORDER — OXYCODONE-ACETAMINOPHEN 5-325 MG PO TABS: 1.0000 | ORAL_TABLET | ORAL | 0 refills | Status: DC | PRN

## 2021-12-14 MED ORDER — MAGNESIUM SULFATE 2 GM/50ML IV SOLN
2.0000 g | Freq: Once | INTRAVENOUS | Status: AC
  Administered 2021-12-14: 2 g via INTRAVENOUS
  Filled 2021-12-14: qty 50

## 2021-12-14 MED ORDER — INSULIN ASPART 100 UNIT/ML IJ SOLN
7.0000 [IU] | Freq: Once | INTRAMUSCULAR | Status: AC
  Administered 2021-12-14: 7 [IU] via INTRAVENOUS
  Filled 2021-12-14: qty 1

## 2021-12-14 MED ORDER — SODIUM CHLORIDE 0.9 % IV SOLN: INTRAVENOUS | Status: DC

## 2021-12-14 MED ORDER — ALBUTEROL SULFATE (2.5 MG/3ML) 0.083% IN NEBU
INHALATION_SOLUTION | RESPIRATORY_TRACT | Status: AC
  Administered 2021-12-14: 10 mg
  Filled 2021-12-14: qty 12

## 2021-12-14 MED ORDER — FENTANYL CITRATE PF 50 MCG/ML IJ SOSY
100.0000 ug | PREFILLED_SYRINGE | Freq: Once | INTRAMUSCULAR | Status: AC
  Administered 2021-12-14: 100 ug via INTRAVENOUS
  Filled 2021-12-14: qty 2

## 2021-12-14 MED ORDER — SODIUM CHLORIDE 0.9 % IV BOLUS
1000.0000 mL | Freq: Once | INTRAVENOUS | Status: AC
  Administered 2021-12-14: 1000 mL via INTRAVENOUS

## 2021-12-14 MED ORDER — IPRATROPIUM BROMIDE 0.02 % IN SOLN
RESPIRATORY_TRACT | Status: AC
  Administered 2021-12-14: 0.5 mg
  Filled 2021-12-14: qty 2.5

## 2021-12-14 NOTE — ED Provider Notes (Signed)
[facility] EMERGENCY DEPARTMENT Provider Note   CSN: [id number] Arrival date & time: 12/14/21  1520     History Chief Complaint  Patient presents with   Shortness of Breath    Cindy [name] is a 65 y.o. female.  HPI She complains of intermittent shortness of breath, on and off for several months despite using oxygen, albuterol and her other medications.  She has had several interventions done during this time.  She continues to use her usual 3 L nasal cannula oxygen, 24/7.  She has noticed left lower chest pain, present constantly for 3 days, worse with movement or deep breathing.  She denies diaphoresis, weakness or dizziness.  There are no other known active modifying factors.    Past Medical History:  Diagnosis Date   Adrenal adenoma 2014   Left   Arthritis    CAD (coronary artery disease)    a. NSTEMI 07/2018 s/p difficult PCI with overlapping DES to LAD, residual OM2 disease treated medically.   Chronic diastolic CHF (congestive heart failure) (HCC)    Chronic pain    Chronic respiratory failure (HCC) On home 02 2-3L   Compression fracture of lumbar vertebra (HCC)    COPD (chronic obstructive pulmonary disease) ([place])    Essential hypertension    Hyperglycemia, drug-induced    Hyperlipidemia    Kidney stones 2014   Left side, multiple   Leukocytosis    Myocardial infarct ([place])    Osteoporosis 2013   Thyroid disease    pt denies   Tobacco abuse     Patient Active Problem List   Diagnosis Date Noted   Secondary hyperparathyroidism ([place]) 01/25/2021   Stucco keratoses 09/27/2019   Diabetes mellitus without complication ([place]) 16/12/930   Chronic pain 11/18/2018   CAD (coronary artery disease) 10/05/2018   Chronic diastolic CHF (congestive heart failure) (HCC)    S/P PTCA (percutaneous transluminal coronary angioplasty)    Orthopnea    H/O non-ST elevation myocardial infarction (NSTEMI)    Primary hypertension    Abnormal EKG  35/57/3220   Diastolic dysfunction 25/42/7062   Pulmonary hypertension ([place]) 05/01/2016   Rash and nonspecific skin eruption 08/15/2014   Ureteral stone with hydronephrosis 05/30/2013   Allergic rhinitis 04/11/2013   Lumbar back pain 03/03/2013   Compression fracture of L3 lumbar vertebra 03/03/2013   Constipation 03/03/2013   Osteoporosis 03/16/2012   GERD (gastroesophageal reflux disease) 03/16/2012   Thrush 03/02/2012   Chronic hypoxemic respiratory failure ([place]) 02/27/2012   Hyperlipidemia 02/25/2012   Tobacco abuse 09/24/2011   COPD (chronic obstructive pulmonary disease) ([place]) 12/24/2009    Past Surgical History:  Procedure Laterality Date   CERVICAL BIOPSY     COLONOSCOPY N/A 03/31/2013   Procedure: COLONOSCOPY;  Surgeon: Rogene Houston, MD;  Location: [facility]ITE;  Service: Endoscopy;  Laterality: N/A;  730   CORONARY ATHERECTOMY N/A 08/19/2018   Procedure: CORONARY ATHERECTOMY;  Surgeon: Nelva Bush, MD;  Location:  CV LAB;  Service: Cardiovascular;  Laterality: N/A;   CORONARY STENT INTERVENTION N/A 08/19/2018   Procedure: CORONARY STENT INTERVENTION;  Surgeon: Nelva Bush, MD;  Location: [place] CV LAB;  Service: Cardiovascular;  Laterality: N/A;   INTRAVASCULAR ULTRASOUND/IVUS N/A 08/19/2018   Procedure: Intravascular Ultrasound/IVUS;  Surgeon: Nelva Bush, MD;  Location: [place] CV LAB;  Service: Cardiovascular;  Laterality: N/A;   LEFT HEART CATH N/A 08/19/2018   Procedure: Left Heart Cath;  Surgeon: Nelva Bush, MD;  Location: [place] CV LAB;  Service: Cardiovascular;  Laterality: N/A;   LEFT HEART CATH AND CORONARY ANGIOGRAPHY N/A 08/16/2018   Procedure: LEFT HEART CATH AND CORONARY ANGIOGRAPHY;  Surgeon: Nelva Bush, MD;  Location: [place] CV LAB;  Service: Cardiovascular;  Laterality: N/A;   TUBAL LIGATION       OB History   No obstetric history on file.     Family History  Problem  Relation Age of Onset   Heart disease Mother    Hypertension Sister    Hypertension Brother     Social History   Tobacco Use   Smoking status: Some Days    Packs/day: 1.00    Years: 25.00    Pack years: 25.00    Types: Cigarettes   Smokeless tobacco: Never   Tobacco comments:    2-5 cigarettes smoked daily 09/03/21 ep  Vaping Use   Vaping Use: Never used  Substance Use Topics   Alcohol use: No   Drug use: No    Home Medications Prior to Admission medications   Medication Sig Start Date End Date Taking? Authorizing Provider  amoxicillin-clavulanate (AUGMENTIN) 875-125 MG tablet Take 1 tablet by mouth 2 (two) times daily. One po bid x 7 days 12/14/21  Yes Daleen Bo, MD  oxyCODONE-acetaminophen (PERCOCET) 5-325 MG tablet Take 1 tablet by mouth every 4 (four) hours as needed for severe pain. 12/14/21  Yes Daleen Bo, MD  potassium chloride (KLOR-CON) 10 MEQ tablet Take 1 tablet (10 mEq total) by mouth daily. 12/14/21  Yes Daleen Bo, MD  predniSONE (DELTASONE) 10 MG tablet Take q day 6,5,4,3,2,1 12/14/21  Yes Daleen Bo, MD  albuterol (PROVENTIL) (2.5 MG/3ML) 0.083% nebulizer solution Take 3 mLs (2.5 mg total) by nebulization every 4 (four) hours as needed for wheezing or shortness of breath. Dx: J44.9- COPD. 09/09/21   Collene Gobble, MD  albuterol (VENTOLIN HFA) 108 (90 Base) MCG/ACT inhaler Inhale 2 puffs into the lungs every 4 (four) hours as needed for wheezing or shortness of breath. 05/30/21   Lindell Spar, MD  alendronate (FOSAMAX) 70 MG tablet TAKE 1 TAB BY MOUTH EVERY WEEK ON AN EMPTY STOMACH. 05/16/21   Lindell Spar, MD  atorvastatin (LIPITOR) 80 MG tablet TAKE 1 TABLET BY MOUTH DAILY 01/15/21   Ahmed Prima, [place] M, PA-C  Blood Glucose Monitoring Suppl (BLOOD GLUCOSE SYSTEM PAK) KIT Please dispense based on patient and insurance preference. Use as directed to monitor FSBS 1x daily. Dx: E11.9. 09/29/19   Alycia Rossetti, MD  budesonide-formoterol  [facility]) 160-4.5 MCG/ACT inhaler Inhale 2 puffs into the lungs 2 (two) times daily. 05/06/21   Lindell Spar, MD  clopidogrel (PLAVIX) 75 MG tablet Take 1 tablet (75 mg total) by mouth daily. 05/06/21   Lindell Spar, MD  doxycycline (VIBRA-TABS) 100 MG tablet Take 1 tablet (100 mg total) by mouth 2 (two) times daily. Patient not taking: Reported on 10/29/2021 09/09/21   Collene Gobble, MD  ezetimibe (ZETIA) 10 MG tablet TAKE 1 TABLET(10 MG) BY MOUTH DAILY 04/15/21   Ahmed Prima, [place] M, PA-C  furosemide (LASIX) 40 MG tablet ALTERNATE TAKING 2 TABLETS ONE DAY THEN 1 TABLET NEXT DAY AND REPEAT 04/18/21   Lindell Spar, MD  glipiZIDE (GLUCOTROL) 10 MG tablet Take 1 tablet (10 mg total) by mouth 2 (two) times daily before a meal. 02/27/21   Lupton, Modena Nunnery, MD  Glucose Blood (BLOOD GLUCOSE TEST STRIPS) STRP Please dispense based on patient and insurance preference. Use as directed to monitor FSBS 1x daily. Dx:  E11.9. 09/29/19   Alycia Rossetti, MD  guaiFENesin-codeine 100-10 MG/5ML syrup Take 10 mLs by mouth every 6 (six) hours as needed for cough. 09/05/21   Veryl Speak, MD  ipratropium (ATROVENT) 0.03 % nasal spray Place 2 sprays into both nostrils every 12 (twelve) hours. 04/04/21   Lindell Spar, MD  Lancets MISC Please dispense based on patient and insurance preference. Use as directed to monitor FSBS 1x daily. Dx: E11.9. 04/18/21   Lindell Spar, MD  LINZESS 145 MCG CAPS capsule TAKE 1 CAPSULE BY MOUTH BEFORE BREAKFAST. Patient not taking: Reported on 10/29/2021 02/13/21   Alycia Rossetti, MD  metoprolol tartrate (LOPRESSOR) 25 MG tablet Take 1 tablet (25 mg total) by mouth 2 (two) times daily. 02/13/21   Cuming, Modena Nunnery, MD  nitroGLYCERIN (NITROSTAT) 0.4 MG SL tablet Place 1 tablet (0.4 mg total) under the tongue every 5 (five) minutes as needed for chest pain. If no improvement with 3 doses call 911 08/27/18   Delsa Grana, PA-C  nystatin (MYCOSTATIN) 100000 UNIT/ML suspension Use as directed  5 mLs (500,000 Units total) in the mouth or throat 4 (four) times daily. Swish and swallow 03/11/21   Lindell Spar, MD  OXYGEN Inhale 3 L into the lungs continuous.     [provider]  pantoprazole (PROTONIX) 40 MG tablet TAKE 1 TABLET(40 MG) BY MOUTH TWICE DAILY Patient taking differently: Take 40 mg by mouth 2 (two) times daily. 06/04/21   Lindell Spar, MD  polyethylene glycol [facility]) packet Take 17 g by mouth daily. 06/04/18   Varney Biles, MD  spironolactone (ALDACTONE) 25 MG tablet Take 1 tablet (25 mg total) by mouth daily. 05/31/21   Lindell Spar, MD  Tiotropium Bromide Monohydrate (SPIRIVA RESPIMAT) 2.5 MCG/ACT AERS Inhale 2 puffs into the lungs daily. 02/22/21   Alycia Rossetti, MD    Allergies    Keflex [cephalexin], Losartan, Sulfa antibiotics, Avelox [moxifloxacin hcl in nacl], and Toradol [ketorolac tromethamine]  Review of Systems   Review of Systems  All other systems reviewed and are negative.  Physical Exam Updated Vital Signs BP (!) 124/96    Pulse (!) 126    Temp 98.4 F (36.9 C) (Oral)    Resp (!) 23    Ht 5' 2"  (1.575 m)    Wt 85.7 kg    SpO2 96%    BMI 34.57 kg/m   Physical Exam Vitals and nursing note reviewed.  Constitutional:      General: She is not in acute distress.    Appearance: She is well-developed. She is obese. She is ill-appearing. She is not toxic-appearing or diaphoretic.  HENT:     Head: Normocephalic and atraumatic.     Right Ear: External ear normal.     Left Ear: External ear normal.  Eyes:     Conjunctiva/sclera: Conjunctivae normal.     Pupils: Pupils are equal, round, and reactive to light.  Neck:     Trachea: Phonation normal.  Cardiovascular:     Rate and Rhythm: Regular rhythm. Tachycardia present.     Heart sounds: Normal heart sounds.  Pulmonary:     Effort: Pulmonary effort is normal. No respiratory distress.     Breath sounds: No stridor. Wheezing (Generalized) present. No rhonchi or rales.      Comments: Decreased air movement bilaterally.  No increased work of breathing. Chest:     Chest wall: No tenderness.  Abdominal:     Palpations: Abdomen  is soft.     Tenderness: There is no abdominal tenderness.  Musculoskeletal:        General: Normal range of motion.     Cervical back: Normal range of motion and neck supple.     Right lower leg: No edema.     Left lower leg: No edema.  Skin:    General: Skin is warm and dry.  Neurological:     Mental Status: She is alert and oriented to person, place, and time.     Cranial Nerves: No cranial nerve deficit.     Sensory: No sensory deficit.     Motor: No abnormal muscle tone.     Coordination: Coordination normal.  Psychiatric:        Mood and Affect: Mood normal.        Behavior: Behavior normal.        Thought Content: Thought content normal.        Judgment: Judgment normal.    ED Results / Procedures / Treatments   Labs (all labs ordered are listed, but only abnormal results are displayed) Labs Reviewed  COMPREHENSIVE METABOLIC PANEL - Abnormal; Notable for the following components:      Result Value   Sodium 129 (*)    Chloride 85 (*)    Glucose, Bld 501 (*)    Creatinine, Ser 1.15 (*)    Calcium 8.7 (*)    Total Protein 6.3 (*)    AST 80 (*)    ALT 91 (*)    Alkaline Phosphatase 134 (*)    GFR, Estimated 53 (*)    All other components within normal limits  CBC WITH DIFFERENTIAL/PLATELET - Abnormal; Notable for the following components:   WBC 14.1 (*)    RDW 16.3 (*)    Neutro Abs 11.0 (*)    Monocytes Absolute 1.1 (*)    Abs Immature Granulocytes 0.29 (*)    All other components within normal limits  BLOOD GAS, VENOUS - Abnormal; Notable for the following components:   pO2, Ven 62.3 (*)    Bicarbonate 31.0 (*)    Acid-Base Excess 8.9 (*)    All other components within normal limits  CBG MONITORING, ED - Abnormal; Notable for the following components:   Glucose-Capillary 406 (*)    All other components  within normal limits  RESP PANEL BY RT-PCR (FLU A&B, COVID) [id number]  TROPONIN I (HIGH SENSITIVITY)  TROPONIN I (HIGH SENSITIVITY)    EKG EKG Interpretation  Date/Time:  Saturday December 14 2021 15:34:01 EST Ventricular Rate:  115 PR Interval:  149 QRS Duration: 99 QT Interval:  353 QTC Calculation: 489 R Axis:   260 Text Interpretation: Sinus tachycardia Left anterior fascicular block Low voltage, extremity and precordial leads Right bundle branch block since last tracing no significant change Confirmed by Daleen Bo [phone]) on 12/14/2021 3:58:38 PM  Radiology DG Chest Port 1 View  Result Date: 12/14/2021 CLINICAL DATA:  Shortness of breath for several days. EXAM: PORTABLE CHEST 1 VIEW COMPARISON:  Chest radiograph 11/03/2021 FINDINGS: The heart size and mediastinal contours are within normal limits. Aortic calcifications. Emphysematous changes in the upper lobes. No focal consolidation, pleural effusion, or pneumothorax. The visualized skeletal structures are unremarkable. IMPRESSION: No acute cardiopulmonary abnormality. Electronically Signed   By: Ileana Roup M.D.   On: 12/14/2021 16:25    Procedures .Critical Care Performed by: Daleen Bo, MD Authorized by: Daleen Bo, MD   Critical care provider statement:    Critical care time (minutes):  55   Critical care start time:  12/14/2021 3:51 PM   Critical care end time:  12/14/2021 11:15 PM   Critical care time was exclusive of:  Separately billable procedures and treating other patients   Critical care was time spent personally by me on the following activities:  Blood draw for specimens, development of treatment plan with patient or surrogate, discussions with consultants, evaluation of patient's response to treatment, examination of patient, ordering and performing treatments and interventions, ordering and review of laboratory studies, ordering and review of radiographic studies, pulse oximetry, re-evaluation of  patient's condition and review of old charts   Medications Ordered in ED Medications  0.9 %  sodium chloride infusion ( Intravenous New Bag/Given 12/14/21 1631)  albuterol (PROVENTIL,VENTOLIN) solution continuous neb (10 mg/hr Nebulization Not Given 12/14/21 1618)  mometasone-formoterol (DULERA) 200-5 MCG/ACT inhaler 2 puff (2 puffs Inhalation Given 12/14/21 2034)  ipratropium (ATROVENT) 0.03 % nasal spray 2 spray (2 sprays Each Nare Given 12/14/21 2148)  umeclidinium bromide (INCRUSE ELLIPTA) 62.5 MCG/ACT 2 puff (2 puffs Inhalation Given 12/14/21 2202)  lactated ringers bolus 2 mL (has no administration in time range)  fentaNYL (SUBLIMAZE) injection 50 mcg (50 mcg Intravenous Given 12/14/21 1635)  magnesium sulfate IVPB 2 g 50 mL (0 g Intravenous Stopped 12/14/21 1824)  methylPREDNISolone sodium succinate (SOLU-MEDROL) 125 mg/2 mL injection 125 mg (125 mg Intravenous Given 12/14/21 1632)  albuterol (PROVENTIL) (2.5 MG/3ML) 0.083% nebulizer solution (10 mg  Given 12/14/21 1618)  insulin aspart (novoLOG) injection 7 Units (7 Units Intravenous Given 12/14/21 1822)  sodium chloride 0.9 % bolus 1,000 mL (0 mLs Intravenous Stopped 12/14/21 2005)  fentaNYL (SUBLIMAZE) injection 100 mcg (100 mcg Intravenous Given 12/14/21 2037)  ipratropium (ATROVENT) 0.02 % nebulizer solution (0.5 mg  Given 12/14/21 2201)  albuterol (PROVENTIL) (2.5 MG/3ML) 0.083% nebulizer solution (2.5 mg  Given 12/14/21 2201)    ED Course  I have reviewed the triage vital signs and the nursing notes.  Pertinent labs & imaging results that were available during my care of the patient were reviewed by me and considered in my medical decision making (see chart for details).    MDM Rules/Calculators/A&P                          Patient Vitals for the past 24 hrs:  BP Temp Temp src Pulse Resp SpO2 Height Weight  12/14/21 2300 (!) 124/96 -- -- (!) 126 (!) 23 96 % -- --  12/14/21 2230 (!) 121/94 -- -- (!) 123 (!) 22 100 % --  --  12/14/21 2209 -- -- -- -- -- 100 % -- --  12/14/21 2203 -- -- -- -- -- 100 % -- --  12/14/21 2100 124/80 -- -- (!) 119 16 97 % -- --  12/14/21 2000 (!) 147/91 -- -- (!) 115 17 98 % -- --  12/14/21 1930 (!) 138/93 -- -- (!) 115 18 98 % -- --  12/14/21 1900 (!) 141/94 -- -- (!) 111 (!) 22 100 % -- --  12/14/21 1845 -- -- -- (!) 110 (!) 24 99 % -- --  12/14/21 1830 (!) 139/105 -- -- (!) 110 (!) 21 100 % -- --  12/14/21 1815 -- -- -- (!) 113 20 91 % -- --  12/14/21 1800 (!) 136/94 -- -- (!) 114 (!) 21 (!) 89 % -- --  12/14/21 1745 -- -- -- (!) 113 17 (!) 89 % -- --  12/14/21 1730 131/84 -- -- Marland Kitchen  113 17 (!) 89 % -- --  12/14/21 1715 -- -- -- (!) 116 17 92 % -- --  12/14/21 1700 (!) 141/96 -- -- (!) 113 17 99 % -- --  12/14/21 1645 -- -- -- (!) 117 14 99 % -- --  12/14/21 1630 (!) 135/107 -- -- (!) 117 (!) 23 98 % -- --  12/14/21 1618 -- -- -- -- -- 98 % -- --  12/14/21 1615 -- -- -- (!) 117 16 98 % -- --  12/14/21 1600 (!) 137/97 -- -- (!) 109 18 98 % -- --  12/14/21 1545 -- -- -- (!) 114 18 97 % -- --  12/14/21 1541 -- 98.4 F (36.9 C) Oral -- -- -- -- --  12/14/21 1538 -- -- -- -- -- -- 5' 2"  (1.575 m) 85.7 kg  12/14/21 1530 (!) 146/105 -- -- (!) 124 12 94 % -- --    10:54 PM Reevaluation with update and discussion. After initial assessment and treatment, an updated evaluation reveals she is comfortable in no respiratory distress.  Vitals improved and she wants to go home.Daleen Bo   Medical Decision Making:  This patient is presenting for evaluation of COPD exacerbation, which does require a range of treatment options, and is a complaint that involves a high risk of morbidity and mortality. The differential diagnoses include ACS, pneumonia, PE, COPD exacerbation. I decided to review old records, and in summary elderly female on oxygen at home, presenting with recurrent wheezing and shortness of breath.  She states she stopped smoking about 5 weeks ago.  She is on continuous  home oxygen.  No interventions within the last 4 weeks. I obtained additional historical information from spouse at bedside.  Clinical Laboratory Tests Ordered, included CBC, Metabolic panel, and blood gas, viral panel, troponin . Review indicates normal except white count high, glucose high, chloride low, sodium low, creatinine high, calcium low, total protein low, AST high, ALT high, alk phos stays high, GFR low. Radiologic Tests Ordered, included chest x-ray.  I independently Visualized: Radiographic images, which show no acute abnormalities  Cardiac Monitor Tracing which shows sinus tachycardia      Critical Interventions-clinical evaluation, laboratory testing, medication treatment, radiography, IV fluids, observation and reassessment  After These Interventions, the Patient was reevaluated and was found improving, comfortable to go home.  She continues with mild hyperglycemia and tachycardia however these are close to her baseline.  Doubt unstable metabolic status or impending vascular collapse.  Prescriptions written to treat her symptoms.  Considered hospitalization however she is improved and wants to go home.  She will follow-up with her PCP for further evaluation and treatment next week.  CRITICAL CARE-yes Performed by: Daleen Bo  Nursing Notes Reviewed/ Care Coordinated Applicable Imaging Reviewed Interpretation of Laboratory Data incorporated into ED treatment  The patient appears reasonably screened and/or stabilized for discharge and I doubt any other medical condition or other [facility] requiring further screening, evaluation, or treatment in the ED at this time prior to discharge.  Plan: Home Medications-continue usual; Home Treatments-drink plenty of fluids to help lower blood sugar; return here if the recommended treatment, does not improve the symptoms; Recommended follow up-PCP follow-up as soon as possible for checkup.        Final Clinical Impression(s) / ED  Diagnoses Final diagnoses:  COPD exacerbation ([place])  Hyperglycemia    Rx / DC Orders ED Discharge Orders          Ordered    amoxicillin-clavulanate (  AUGMENTIN) 875-125 MG tablet  2 times daily        12/14/21 2319    predniSONE (DELTASONE) 10 MG tablet        12/14/21 2319    oxyCODONE-acetaminophen (PERCOCET) 5-325 MG tablet  Every 4 hours PRN        12/14/21 2319    potassium chloride (KLOR-CON) 10 MEQ tablet  Daily        12/14/21 2319             Daleen Bo, MD 12/15/21 1745

## 2021-12-14 NOTE — Discharge Instructions (Addendum)
Continue taking your usual medications to help improve your condition.  We sent prescriptions for prednisone, oxycodone, and an antibiotic to your pharmacy.  Start taking them tomorrow.  Make sure you are drinking at least 1 or 2 L of water every day to improve your hydration status and your blood glucose.  Stay on a low carbohydrate diet.  Your potassium level is low so we are starting you on potassium to take.

## 2021-12-14 NOTE — ED Notes (Signed)
When putting pt on purewick she advised she had bumps  on her buttocks advised she would need to let her nurse take a look to advise further

## 2021-12-14 NOTE — ED Notes (Signed)
Checked urine canister

## 2021-12-14 NOTE — ED Triage Notes (Signed)
Pt presents to ED with complaints of increased SOB x couple of days. Pt chronically on 3L of O2 at home.

## 2021-12-14 NOTE — ED Notes (Signed)
Critical value Glucose 501 reported to dr Eulis Foster and primary RN at this time.

## 2021-12-15 ENCOUNTER — Observation Stay (HOSPITAL_COMMUNITY): Payer: Medicare Other

## 2021-12-15 ENCOUNTER — Emergency Department (HOSPITAL_COMMUNITY): Payer: Medicare Other

## 2021-12-15 DIAGNOSIS — E119 Type 2 diabetes mellitus without complications: Secondary | ICD-10-CM | POA: Diagnosis not present

## 2021-12-15 DIAGNOSIS — R0902 Hypoxemia: Secondary | ICD-10-CM | POA: Diagnosis not present

## 2021-12-15 DIAGNOSIS — R7989 Other specified abnormal findings of blood chemistry: Secondary | ICD-10-CM | POA: Diagnosis not present

## 2021-12-15 DIAGNOSIS — Z9981 Dependence on supplemental oxygen: Secondary | ICD-10-CM | POA: Diagnosis not present

## 2021-12-15 DIAGNOSIS — Z6834 Body mass index (BMI) 34.0-34.9, adult: Secondary | ICD-10-CM | POA: Diagnosis not present

## 2021-12-15 DIAGNOSIS — E871 Hypo-osmolality and hyponatremia: Secondary | ICD-10-CM

## 2021-12-15 DIAGNOSIS — I5032 Chronic diastolic (congestive) heart failure: Secondary | ICD-10-CM

## 2021-12-15 DIAGNOSIS — M1990 Unspecified osteoarthritis, unspecified site: Secondary | ICD-10-CM | POA: Diagnosis present

## 2021-12-15 DIAGNOSIS — E1165 Type 2 diabetes mellitus with hyperglycemia: Secondary | ICD-10-CM | POA: Diagnosis present

## 2021-12-15 DIAGNOSIS — I110 Hypertensive heart disease with heart failure: Secondary | ICD-10-CM | POA: Diagnosis present

## 2021-12-15 DIAGNOSIS — Z20822 Contact with and (suspected) exposure to covid-19: Secondary | ICD-10-CM | POA: Diagnosis present

## 2021-12-15 DIAGNOSIS — I469 Cardiac arrest, cause unspecified: Secondary | ICD-10-CM | POA: Diagnosis not present

## 2021-12-15 DIAGNOSIS — K219 Gastro-esophageal reflux disease without esophagitis: Secondary | ICD-10-CM | POA: Diagnosis present

## 2021-12-15 DIAGNOSIS — I2510 Atherosclerotic heart disease of native coronary artery without angina pectoris: Secondary | ICD-10-CM | POA: Diagnosis present

## 2021-12-15 DIAGNOSIS — E785 Hyperlipidemia, unspecified: Secondary | ICD-10-CM | POA: Diagnosis present

## 2021-12-15 DIAGNOSIS — I499 Cardiac arrhythmia, unspecified: Secondary | ICD-10-CM | POA: Diagnosis not present

## 2021-12-15 DIAGNOSIS — R079 Chest pain, unspecified: Secondary | ICD-10-CM | POA: Diagnosis not present

## 2021-12-15 DIAGNOSIS — I252 Old myocardial infarction: Secondary | ICD-10-CM | POA: Diagnosis not present

## 2021-12-15 DIAGNOSIS — R059 Cough, unspecified: Secondary | ICD-10-CM | POA: Diagnosis not present

## 2021-12-15 DIAGNOSIS — R0689 Other abnormalities of breathing: Secondary | ICD-10-CM | POA: Diagnosis not present

## 2021-12-15 DIAGNOSIS — D72829 Elevated white blood cell count, unspecified: Secondary | ICD-10-CM

## 2021-12-15 DIAGNOSIS — R55 Syncope and collapse: Secondary | ICD-10-CM | POA: Diagnosis present

## 2021-12-15 DIAGNOSIS — Z955 Presence of coronary angioplasty implant and graft: Secondary | ICD-10-CM | POA: Diagnosis not present

## 2021-12-15 DIAGNOSIS — Z7902 Long term (current) use of antithrombotics/antiplatelets: Secondary | ICD-10-CM | POA: Diagnosis not present

## 2021-12-15 DIAGNOSIS — I10 Essential (primary) hypertension: Secondary | ICD-10-CM

## 2021-12-15 DIAGNOSIS — R404 Transient alteration of awareness: Secondary | ICD-10-CM | POA: Diagnosis not present

## 2021-12-15 DIAGNOSIS — F17210 Nicotine dependence, cigarettes, uncomplicated: Secondary | ICD-10-CM | POA: Diagnosis present

## 2021-12-15 DIAGNOSIS — Z79899 Other long term (current) drug therapy: Secondary | ICD-10-CM | POA: Diagnosis not present

## 2021-12-15 DIAGNOSIS — I272 Pulmonary hypertension, unspecified: Secondary | ICD-10-CM | POA: Diagnosis present

## 2021-12-15 DIAGNOSIS — Z7951 Long term (current) use of inhaled steroids: Secondary | ICD-10-CM | POA: Diagnosis not present

## 2021-12-15 DIAGNOSIS — N2581 Secondary hyperparathyroidism of renal origin: Secondary | ICD-10-CM | POA: Diagnosis present

## 2021-12-15 DIAGNOSIS — J441 Chronic obstructive pulmonary disease with (acute) exacerbation: Secondary | ICD-10-CM | POA: Diagnosis present

## 2021-12-15 DIAGNOSIS — G8929 Other chronic pain: Secondary | ICD-10-CM | POA: Diagnosis present

## 2021-12-15 DIAGNOSIS — Z7984 Long term (current) use of oral hypoglycemic drugs: Secondary | ICD-10-CM | POA: Diagnosis not present

## 2021-12-15 DIAGNOSIS — J9621 Acute and chronic respiratory failure with hypoxia: Secondary | ICD-10-CM

## 2021-12-15 DIAGNOSIS — E782 Mixed hyperlipidemia: Secondary | ICD-10-CM | POA: Diagnosis present

## 2021-12-15 DIAGNOSIS — R7401 Elevation of levels of liver transaminase levels: Secondary | ICD-10-CM | POA: Diagnosis not present

## 2021-12-15 DIAGNOSIS — E669 Obesity, unspecified: Secondary | ICD-10-CM | POA: Diagnosis present

## 2021-12-15 DIAGNOSIS — R0602 Shortness of breath: Secondary | ICD-10-CM | POA: Diagnosis not present

## 2021-12-15 DIAGNOSIS — L89312 Pressure ulcer of right buttock, stage 2: Secondary | ICD-10-CM | POA: Diagnosis present

## 2021-12-15 DIAGNOSIS — M810 Age-related osteoporosis without current pathological fracture: Secondary | ICD-10-CM | POA: Diagnosis present

## 2021-12-15 DIAGNOSIS — K760 Fatty (change of) liver, not elsewhere classified: Secondary | ICD-10-CM | POA: Diagnosis not present

## 2021-12-15 DIAGNOSIS — J439 Emphysema, unspecified: Secondary | ICD-10-CM | POA: Diagnosis not present

## 2021-12-15 LAB — GLUCOSE, CAPILLARY
Glucose-Capillary: 248 mg/dL — ABNORMAL HIGH (ref 70–99)
Glucose-Capillary: 189 mg/dL — ABNORMAL HIGH (ref 70–99)
Glucose-Capillary: 369 mg/dL — ABNORMAL HIGH (ref 70–99)
Glucose-Capillary: 195 mg/dL — ABNORMAL HIGH (ref 70–99)

## 2021-12-15 LAB — CBC
HCT: 39.4 % (ref 36.0–46.0)
Hemoglobin: 12.9 g/dL (ref 12.0–15.0)
MCH: 30.4 pg (ref 26.0–34.0)
MCHC: 32.7 g/dL (ref 30.0–36.0)
MCV: 92.9 fL (ref 80.0–100.0)
Platelets: 271 10*3/uL (ref 150–400)
RBC: 4.24 MIL/uL (ref 3.87–5.11)
RDW: 16.7 % — ABNORMAL HIGH (ref 11.5–15.5)
WBC: 21.2 10*3/uL — ABNORMAL HIGH (ref 4.0–10.5)
nRBC: 0 % (ref 0.0–0.2)

## 2021-12-15 LAB — COMPREHENSIVE METABOLIC PANEL
ALT: 81 U/L — ABNORMAL HIGH (ref 0–44)
AST: 63 U/L — ABNORMAL HIGH (ref 15–41)
Albumin: 3.4 g/dL — ABNORMAL LOW (ref 3.5–5.0)
Alkaline Phosphatase: 118 U/L (ref 38–126)
Anion gap: 13 (ref 5–15)
BUN: 14 mg/dL (ref 8–23)
CO2: 29 mmol/L (ref 22–32)
Calcium: 7.7 mg/dL — ABNORMAL LOW (ref 8.9–10.3)
Chloride: 89 mmol/L — ABNORMAL LOW (ref 98–111)
Creatinine, Ser: 1.05 mg/dL — ABNORMAL HIGH (ref 0.44–1.00)
GFR, Estimated: 59 mL/min — ABNORMAL LOW (ref >60)
Glucose, Bld: 273 mg/dL — ABNORMAL HIGH (ref 70–99)
Potassium: 3.6 mmol/L (ref 3.5–5.1)
Sodium: 131 mmol/L — ABNORMAL LOW (ref 135–145)
Total Bilirubin: 0.5 mg/dL (ref 0.3–1.2)
Total Protein: 6.1 g/dL — ABNORMAL LOW (ref 6.5–8.1)

## 2021-12-15 LAB — HEMOGLOBIN A1C
Hgb A1c MFr Bld: 12.4 % — ABNORMAL HIGH (ref 4.8–5.6)
Mean Plasma Glucose: 309.18 mg/dL

## 2021-12-15 LAB — TROPONIN I (HIGH SENSITIVITY)
Troponin I (High Sensitivity): 14 ng/L (ref <18)
Troponin I (High Sensitivity): 28 ng/L — ABNORMAL HIGH (ref <18)

## 2021-12-15 LAB — HEPATITIS PANEL, ACUTE
HCV Ab: NONREACTIVE
Hep A IgM: NONREACTIVE
Hep B C IgM: NONREACTIVE
Hepatitis B Surface Ag: NONREACTIVE

## 2021-12-15 LAB — PHOSPHORUS: Phosphorus: 2 mg/dL — ABNORMAL LOW (ref 2.5–4.6)

## 2021-12-15 LAB — MAGNESIUM: Magnesium: 1.8 mg/dL (ref 1.7–2.4)

## 2021-12-15 LAB — D-DIMER, QUANTITATIVE: D-Dimer, Quant: 1.28 ug/mL-FEU — ABNORMAL HIGH (ref 0.00–0.50)

## 2021-12-15 LAB — HIV ANTIBODY (ROUTINE TESTING W REFLEX): HIV Screen 4th Generation wRfx: NONREACTIVE

## 2021-12-15 MED ORDER — ACETAMINOPHEN 325 MG PO TABS
650.0000 mg | ORAL_TABLET | Freq: Four times a day (QID) | ORAL | Status: DC | PRN
  Administered 2021-12-16: 03:00:00 650 mg via ORAL
  Filled 2021-12-15: qty 2

## 2021-12-15 MED ORDER — DM-GUAIFENESIN ER 30-600 MG PO TB12
1.0000 | ORAL_TABLET | Freq: Two times a day (BID) | ORAL | Status: DC
  Administered 2021-12-15 – 2021-12-16 (×3): 1 via ORAL
  Filled 2021-12-15 (×3): qty 1

## 2021-12-15 MED ORDER — METOPROLOL TARTRATE 25 MG PO TABS
25.0000 mg | ORAL_TABLET | Freq: Two times a day (BID) | ORAL | Status: DC
  Administered 2021-12-15 – 2021-12-16 (×3): 25 mg via ORAL
  Filled 2021-12-15 (×3): qty 1

## 2021-12-15 MED ORDER — HYDRALAZINE HCL 20 MG/ML IJ SOLN: 10.0000 mg | INTRAMUSCULAR | Status: DC | PRN

## 2021-12-15 MED ORDER — INSULIN ASPART 100 UNIT/ML IJ SOLN: 0.0000 [IU] | Freq: Three times a day (TID) | INTRAMUSCULAR | Status: DC

## 2021-12-15 MED ORDER — INSULIN ASPART 100 UNIT/ML IJ SOLN
0.0000 [IU] | Freq: Three times a day (TID) | INTRAMUSCULAR | Status: DC
  Administered 2021-12-15: 17:00:00 20 [IU] via SUBCUTANEOUS
  Administered 2021-12-15: 08:00:00 7 [IU] via SUBCUTANEOUS
  Administered 2021-12-15: 12:00:00 4 [IU] via SUBCUTANEOUS
  Administered 2021-12-16: 12:00:00 11 [IU] via SUBCUTANEOUS
  Administered 2021-12-16: 09:00:00 7 [IU] via SUBCUTANEOUS

## 2021-12-15 MED ORDER — ENOXAPARIN SODIUM 40 MG/0.4ML IJ SOSY
40.0000 mg | PREFILLED_SYRINGE | INTRAMUSCULAR | Status: DC
  Administered 2021-12-15 – 2021-12-16 (×2): 40 mg via SUBCUTANEOUS
  Filled 2021-12-15 (×2): qty 0.4

## 2021-12-15 MED ORDER — AZITHROMYCIN 250 MG PO TABS
250.0000 mg | ORAL_TABLET | Freq: Every day | ORAL | Status: DC
  Administered 2021-12-16: 09:00:00 250 mg via ORAL
  Filled 2021-12-15: qty 1

## 2021-12-15 MED ORDER — INSULIN ASPART 100 UNIT/ML IJ SOLN: 0.0000 [IU] | Freq: Every day | INTRAMUSCULAR | Status: DC

## 2021-12-15 MED ORDER — PREDNISONE 20 MG PO TABS
40.0000 mg | ORAL_TABLET | Freq: Every day | ORAL | Status: DC
  Administered 2021-12-16: 09:00:00 40 mg via ORAL
  Filled 2021-12-15: qty 2

## 2021-12-15 MED ORDER — FUROSEMIDE 40 MG PO TABS
40.0000 mg | ORAL_TABLET | Freq: Every day | ORAL | Status: DC
  Administered 2021-12-15 – 2021-12-16 (×2): 40 mg via ORAL
  Filled 2021-12-15 (×2): qty 1

## 2021-12-15 MED ORDER — OXYCODONE-ACETAMINOPHEN 5-325 MG PO TABS
1.0000 | ORAL_TABLET | Freq: Four times a day (QID) | ORAL | Status: DC | PRN
  Administered 2021-12-15 (×2): 1 via ORAL
  Filled 2021-12-15 (×2): qty 1

## 2021-12-15 MED ORDER — SPIRONOLACTONE 25 MG PO TABS
25.0000 mg | ORAL_TABLET | Freq: Every day | ORAL | Status: DC
  Administered 2021-12-16: 09:00:00 25 mg via ORAL
  Filled 2021-12-15: qty 1

## 2021-12-15 MED ORDER — INSULIN ASPART 100 UNIT/ML IJ SOLN
6.0000 [IU] | Freq: Three times a day (TID) | INTRAMUSCULAR | Status: DC
  Administered 2021-12-15 – 2021-12-16 (×3): 6 [IU] via SUBCUTANEOUS

## 2021-12-15 MED ORDER — IOHEXOL 350 MG/ML SOLN
100.0000 mL | Freq: Once | INTRAVENOUS | Status: AC | PRN
  Administered 2021-12-15: 07:00:00 100 mL via INTRAVENOUS

## 2021-12-15 MED ORDER — METHYLPREDNISOLONE SODIUM SUCC 40 MG IJ SOLR
40.0000 mg | Freq: Two times a day (BID) | INTRAMUSCULAR | Status: AC
  Administered 2021-12-15 (×2): 40 mg via INTRAVENOUS
  Filled 2021-12-15 (×2): qty 1

## 2021-12-15 MED ORDER — CLOPIDOGREL BISULFATE 75 MG PO TABS
75.0000 mg | ORAL_TABLET | Freq: Every day | ORAL | Status: DC
  Administered 2021-12-15 – 2021-12-16 (×2): 75 mg via ORAL
  Filled 2021-12-15 (×2): qty 1

## 2021-12-15 MED ORDER — AZITHROMYCIN 250 MG PO TABS
500.0000 mg | ORAL_TABLET | Freq: Every day | ORAL | Status: AC
  Administered 2021-12-15: 08:00:00 500 mg via ORAL
  Filled 2021-12-15: qty 2

## 2021-12-15 MED ORDER — FUROSEMIDE 10 MG/ML IJ SOLN
40.0000 mg | Freq: Once | INTRAMUSCULAR | Status: AC
  Administered 2021-12-15: 04:00:00 40 mg via INTRAVENOUS
  Filled 2021-12-15: qty 4

## 2021-12-15 MED ORDER — PANTOPRAZOLE SODIUM 40 MG PO TBEC
40.0000 mg | DELAYED_RELEASE_TABLET | Freq: Two times a day (BID) | ORAL | Status: DC
  Administered 2021-12-15 – 2021-12-16 (×3): 40 mg via ORAL
  Filled 2021-12-15 (×3): qty 1

## 2021-12-15 MED ORDER — IPRATROPIUM BROMIDE 0.02 % IN SOLN
0.5000 mg | Freq: Four times a day (QID) | RESPIRATORY_TRACT | Status: DC
  Administered 2021-12-15 – 2021-12-16 (×6): 0.5 mg via RESPIRATORY_TRACT
  Filled 2021-12-15 (×7): qty 2.5

## 2021-12-15 MED ORDER — NITROGLYCERIN 0.4 MG SL SUBL: 0.4000 mg | SUBLINGUAL_TABLET | SUBLINGUAL | Status: DC | PRN

## 2021-12-15 NOTE — Progress Notes (Signed)
°   12/15/21 0818  Assess: MEWS Score  BP 126/81  Pulse Rate (!) 124  Resp (!) 22  Level of Consciousness Alert  SpO2 100 %  O2 Device Nasal Cannula  O2 Flow Rate (L/min) 3 L/min  Assess: MEWS Score  MEWS Temp 0  MEWS Systolic 0  MEWS Pulse 2  MEWS RR 1  MEWS LOC 0  MEWS Score 3  MEWS Score Color Yellow  Assess: if the MEWS score is Yellow or Red  Were vital signs taken at a resting state? Yes  Focused Assessment No change from prior assessment  Early Detection of Sepsis Score *See Row Information* Medium  MEWS guidelines implemented *See Row Information* Yes  Treat  MEWS Interventions Other (Comment) (MD aware)  Pain Scale 0-10  Pain Score 10  Pain Type Acute pain  Pain Location Breast  Pain Orientation Left  Pain Descriptors / Indicators Aching;Discomfort  Pain Frequency Constant  Pain Onset On-going  Pain Intervention(s) MD notified (Comment)  Multiple Pain Sites Yes  2nd Pain Site  Pain Score 9  Take Vital Signs  Increase Vital Sign Frequency  Yellow: Q 2hr X 2 then Q 4hr X 2, if remains yellow, continue Q 4hrs  Escalate  MEWS: Escalate Yellow: discuss with charge nurse/RN and consider discussing with provider and RRT  Notify: Charge Nurse/RN  Name of Charge Nurse/RN Notified Audrea Muscat RN  Date Charge Nurse/RN Notified 12/15/21  Time Charge Nurse/RN Notified 0820  Notify: Provider  Provider Name/Title Manuella Ghazi  Date Provider Notified 12/15/21  Time Provider Notified [phone]  Notification Type Page (Secure Chat)  Notification Reason Other (Comment) (Mews Score change heart rate and RR)  Provider response See new orders  Date of Provider Response 12/15/21  Time of Provider Response [phone]  Notify: Rapid Response  Name of Rapid Response RN Notified \

## 2021-12-15 NOTE — ED Provider Notes (Signed)
°  Provider Note MRN:  [id number]  Arrival date & time: 12/15/21    ED Course and Medical Decision Making  Assumed care from Dr. Eulis Foster at shift change.  Patient with poor response to the IV fluids.  Now with heart rate of 128, still with diffuse wheezing.  Does not feel well enough to go home.  Will request hospitalist admission.  Procedures  Final Clinical Impressions(s) / ED Diagnoses     ICD-10-CM   1. COPD exacerbation ([place])  J44.1     2. Hyperglycemia  R73.9           Barth Kirks. Sedonia Small, [place] [email or web address]    Maudie Flakes, MD 12/15/21 [phone]

## 2021-12-15 NOTE — Progress Notes (Signed)
Cindy [name] is a 65 y.o. female with medical history significant for COPD on 3 LPM at baseline, essential hypertension, hyperlipidemia, T2DM, CAD s/p stent placement, history of diastolic CHF and tobacco use who presents to the emergency department due to several months of intermittent shortness of breath despite home oxygen 24/7.  Patient was admitted with acute on chronic hypoxemic respiratory failure secondary to COPD exacerbation and has been started on IV steroids, breathing treatments, and azithromycin.  Acute on chronic hypoxemic respiratory failure secondary to COPD exacerbation -Elevated D-dimer, PE ruled out -Continue steroids and breathing treatments -Continue azithromycin  Multiple rib fractures -Question of falls at home -PT evaluation once more stable  Hyponatremia -Secondary to hyperglycemia and therefore pseudohyponatremia -Continue monitoring  Hyperglycemia secondary to poorly controlled type 2 diabetes as well as steroid-induced -Check hemoglobin A1c -Continue resistant scale SSI while on steroids  Transaminitis -Continue close monitoring -Check hepatitis panel -Consider right upper quadrant ultrasound if upward trending  CAD with prior stent/dyslipidemia -Continue Plavix with nitroglycerin as needed and Lopressor -Statin held due to elevated liver enzymes  Chronic diastolic CHF  Essential hypertension -Continue Lopressor  GERD -PPI  Obesity -Lifestyle changes outpatient  Total care time: 30 minutes.

## 2021-12-15 NOTE — H&P (Signed)
History and Physical  Cindy [name] QGB:[id number] DOB: [birth year] DOA: 12/14/2021  Referring physician: Maudie Flakes, MD PCP: [place] Nation, MD  Patient coming from: Home  Chief Complaint: Shortness of breath  HPI: Cindy [name] is a 65 y.o. female with medical history significant for COPD on 3 LPM at baseline, essential hypertension, hyperlipidemia, T2DM, CAD s/p stent placement, history of diastolic CHF and tobacco use who presents to the emergency department due to several months of intermittent shortness of breath despite home oxygen 24/7.  She complained of 3-day onset of left lower chest pain and back pain which worsens with deep breath and movement.  She complains of chest congestion, nonproductive cough, but denies fever, chills, diaphoresis, dizziness or weakness.  ED Course:  In the emergency department, she was tachycardic and tachypneic, O2 sat was 90 to 100% on 3 LPM, other vital signs were within normal range.  Work-up in the ED showed normal CBC except for leukocytosis, hyponatremia, hyperglycemia, elevated liver enzymes, D-dimer 1.28, troponin x2 was flat at 12.  Influenza A, B, SARS coronavirus 2 was negative. Chest x-ray showed no acute cardiopulmonary abnormality Insulin was given DHL elevated blood glucose level, IV hydration was provided, Solu-Medrol was given, IV fentanyl was given due to pain.  Hospitalist was asked to admit patient for further evaluation and management.  Review of Systems: Constitutional: Negative for chills and fever.  HENT: Negative for ear pain and sore throat.   Eyes: Negative for pain and visual disturbance.  Respiratory: Positive for cough and shortness of breath.   Cardiovascular: Negative for chest pain and palpitations.  Gastrointestinal: Negative for abdominal pain and vomiting.  Endocrine: Negative for polyphagia and polyuria.  Genitourinary: Negative for decreased urine volume, dysuria, enuresis Musculoskeletal: Negative for  arthralgias and back pain.  Skin: Negative for color change and rash.  Allergic/Immunologic: Negative for immunocompromised state.  Neurological: Negative for tremors, syncope, speech difficulty, weakness, light-headedness and headaches.  Hematological: Does not bruise/bleed easily.   A full 10 point Review of Systems was done, except as stated above, all other Review of systems were negative.   Past Medical History:  Diagnosis Date   Adrenal adenoma 2014   Left   Arthritis    CAD (coronary artery disease)    a. NSTEMI 07/2018 s/p difficult PCI with overlapping DES to LAD, residual OM2 disease treated medically.   Chronic diastolic CHF (congestive heart failure) (HCC)    Chronic pain    Chronic respiratory failure ([place]) On home 02 2-3L   Compression fracture of lumbar vertebra (HCC)    COPD (chronic obstructive pulmonary disease) ([place])    Essential hypertension    Hyperglycemia, drug-induced    Hyperlipidemia    Kidney stones 2014   Left side, multiple   Leukocytosis    Myocardial infarct [facility])    Osteoporosis 2013   Thyroid disease    pt denies   Tobacco abuse    Past Surgical History:  Procedure Laterality Date   CERVICAL BIOPSY     COLONOSCOPY N/A 03/31/2013   Procedure: COLONOSCOPY;  Surgeon: Rogene Houston, MD;  Location: [facility];  Service: Endoscopy;  Laterality: N/A;  730   CORONARY ATHERECTOMY N/A 08/19/2018   Procedure: CORONARY ATHERECTOMY;  Surgeon: Nelva Bush, MD;  Location: [place] [facility];  Service: Cardiovascular;  Laterality: N/A;   CORONARY STENT INTERVENTION N/A 08/19/2018   Procedure: CORONARY STENT INTERVENTION;  Surgeon: Nelva Bush, MD;  Location: [place] [facility];  Service: Cardiovascular;  Laterality: N/A;   INTRAVASCULAR ULTRASOUND/IVUS N/A 08/19/2018   Procedure: Intravascular Ultrasound/IVUS;  Surgeon: Nelva Bush, MD;  Location: [place] [facility];  Service: Cardiovascular;  Laterality: N/A;   LEFT HEART CATH N/A 08/19/2018    Procedure: Left Heart Cath;  Surgeon: Nelva Bush, MD;  Location: [place] [facility];  Service: Cardiovascular;  Laterality: N/A;   LEFT HEART CATH AND CORONARY ANGIOGRAPHY N/A 08/16/2018   Procedure: LEFT HEART CATH AND CORONARY ANGIOGRAPHY;  Surgeon: Nelva Bush, MD;  Location: [place] [facility];  Service: Cardiovascular;  Laterality: N/A;   TUBAL LIGATION      Social History:  reports that she has been smoking cigarettes. She has a 25.00 pack-year smoking history. She has never used smokeless tobacco. She reports that she does not drink alcohol and does not use drugs.   Allergies  Allergen Reactions   Keflex [Cephalexin] Anaphylaxis and Rash   Losartan     Swelling face   Sulfa Antibiotics    Avelox [Moxifloxacin Hcl In Nacl] Itching and Rash   Toradol [Ketorolac Tromethamine] Swelling, Rash and Other (See Comments)    Bruising, swelling, rash at injection site    Family History  Problem Relation Age of Onset   Heart disease Mother    Hypertension Sister    Hypertension Brother      Prior to Admission medications   Medication Sig Start Date End Date Taking? Authorizing Provider  amoxicillin-clavulanate (AUGMENTIN) 875-125 MG tablet Take 1 tablet by mouth 2 (two) times daily. One po bid x 7 days 12/14/21  Yes Daleen Bo, MD  oxyCODONE-acetaminophen (PERCOCET) 5-325 MG tablet Take 1 tablet by mouth every 4 (four) hours as needed for severe pain. 12/14/21  Yes Daleen Bo, MD  potassium chloride (KLOR-CON) 10 MEQ tablet Take 1 tablet (10 mEq total) by mouth daily. 12/14/21  Yes Daleen Bo, MD  predniSONE (DELTASONE) 10 MG tablet Take q day 6,5,4,3,2,1 12/14/21  Yes Daleen Bo, MD  albuterol (PROVENTIL) (2.5 MG/3ML) 0.083% nebulizer solution Take 3 mLs (2.5 mg total) by nebulization every 4 (four) hours as needed for wheezing or shortness of breath. Dx: J44.9- COPD. 09/09/21   Collene Gobble, MD  albuterol (VENTOLIN HFA) 108 (90 Base) MCG/ACT inhaler  Inhale 2 puffs into the lungs every 4 (four) hours as needed for wheezing or shortness of breath. 05/30/21   Lindell Spar, MD  alendronate (FOSAMAX) 70 MG tablet TAKE 1 TAB BY MOUTH EVERY WEEK ON AN EMPTY STOMACH. 05/16/21   Lindell Spar, MD  atorvastatin (LIPITOR) 80 MG tablet TAKE 1 TABLET BY MOUTH DAILY 01/15/21   Ahmed Prima, [place] M, PA-C  Blood Glucose Monitoring Suppl (BLOOD GLUCOSE SYSTEM PAK) KIT Please dispense based on patient and insurance preference. Use as directed to monitor FSBS 1x daily. Dx: E11.9. 09/29/19   Alycia Rossetti, MD  budesonide-formoterol [facility]) 160-4.5 MCG/ACT inhaler Inhale 2 puffs into the lungs 2 (two) times daily. 05/06/21   Lindell Spar, MD  clopidogrel (PLAVIX) 75 MG tablet Take 1 tablet (75 mg total) by mouth daily. 05/06/21   Lindell Spar, MD  doxycycline (VIBRA-TABS) 100 MG tablet Take 1 tablet (100 mg total) by mouth 2 (two) times daily. Patient not taking: Reported on 10/29/2021 09/09/21   Collene Gobble, MD  ezetimibe (ZETIA) 10 MG tablet TAKE 1 TABLET(10 MG) BY MOUTH DAILY 04/15/21   Ahmed Prima, [place] M, PA-C  furosemide (LASIX) 40 MG tablet ALTERNATE TAKING 2 TABLETS ONE DAY THEN 1 TABLET NEXT DAY  AND REPEAT 04/18/21   Lindell Spar, MD  glipiZIDE (GLUCOTROL) 10 MG tablet Take 1 tablet (10 mg total) by mouth 2 (two) times daily before a meal. 02/27/21   Roodhouse, Modena Nunnery, MD  Glucose Blood (BLOOD GLUCOSE TEST STRIPS) STRP Please dispense based on patient and insurance preference. Use as directed to monitor FSBS 1x daily. Dx: E11.9. 09/29/19   Alycia Rossetti, MD  guaiFENesin-codeine 100-10 MG/5ML syrup Take 10 mLs by mouth every 6 (six) hours as needed for cough. 09/05/21   Veryl Speak, MD  ipratropium (ATROVENT) 0.03 % nasal spray Place 2 sprays into both nostrils every 12 (twelve) hours. 04/04/21   Lindell Spar, MD  Lancets MISC Please dispense based on patient and insurance preference. Use as directed to monitor FSBS 1x daily. Dx: E11.9. 04/18/21    Lindell Spar, MD  LINZESS 145 MCG CAPS capsule TAKE 1 CAPSULE BY MOUTH BEFORE BREAKFAST. Patient not taking: Reported on 10/29/2021 02/13/21   Alycia Rossetti, MD  metoprolol tartrate (LOPRESSOR) 25 MG tablet Take 1 tablet (25 mg total) by mouth 2 (two) times daily. 02/13/21   [place]stone, Modena Nunnery, MD  nitroGLYCERIN (NITROSTAT) 0.4 MG SL tablet Place 1 tablet (0.4 mg total) under the tongue every 5 (five) minutes as needed for chest pain. If no improvement with 3 doses call 911 08/27/18   Delsa Grana, PA-C  nystatin (MYCOSTATIN) 100000 UNIT/ML suspension Use as directed 5 mLs (500,000 Units total) in the mouth or throat 4 (four) times daily. Swish and swallow 03/11/21   Lindell Spar, MD  OXYGEN Inhale 3 L into the lungs continuous.     [provider]  pantoprazole (PROTONIX) 40 MG tablet TAKE 1 TABLET(40 MG) BY MOUTH TWICE DAILY Patient taking differently: Take 40 mg by mouth 2 (two) times daily. 06/04/21   Lindell Spar, MD  polyethylene glycol [facility]) packet Take 17 g by mouth daily. 06/04/18   Varney Biles, MD  spironolactone (ALDACTONE) 25 MG tablet Take 1 tablet (25 mg total) by mouth daily. 05/31/21   Lindell Spar, MD  Tiotropium Bromide Monohydrate (SPIRIVA RESPIMAT) 2.5 MCG/ACT AERS Inhale 2 puffs into the lungs daily. 02/22/21   Alycia Rossetti, MD    Physical Exam: BP 118/77    Pulse (!) 119    Temp 98.4 F (36.9 C) (Oral)    Resp 16    Ht 5' 2"  (1.575 m)    Wt 85.7 kg    SpO2 90%    BMI 34.57 kg/m   General: 65 y.o. year-old female well developed well nourished in no acute distress.  Alert and oriented x3. HEENT: NCAT, EOMI Neck: Supple, trachea medial Cardiovascular: Regular rate and rhythm with no rubs or gallops.  No thyromegaly or JVD noted.  Bilateral trace lower extremity edema. 2/4 pulses in all 4 extremities. Respiratory: Diffuse expiratory wheezing in all lobes.  No rales  Abdomen: Soft, nontender nondistended with normal bowel sounds x4  quadrants. Muskuloskeletal: No cyanosis or clubbing or edema  Neuro: CN II-XII intact, strength 5/5 x 4, sensation, reflexes intact Skin: No ulcerative lesions noted or rashes Psychiatry: Mood is appropriate for condition and setting          Labs on Admission:  Basic Metabolic Panel: Recent Labs  Lab 12/14/21 1618  NA 129*  K 3.7  CL 85*  CO2 32  GLUCOSE 501*  BUN 15  CREATININE 1.15*  CALCIUM 8.7*   Liver Function Tests: Recent Labs  Lab 12/14/21 1618  AST 80*  ALT 91*  ALKPHOS 134*  BILITOT 0.7  PROT 6.3*  ALBUMIN 3.5   No results for input(s): LIPASE, AMYLASE in the last 168 hours. No results for input(s): AMMONIA in the last 168 hours. CBC: Recent Labs  Lab 12/14/21 1618  WBC 14.1*  NEUTROABS 11.0*  HGB 13.3  HCT 40.5  MCV 91.2  PLT 284   Cardiac Enzymes: No results for input(s): CKTOTAL, CKMB, CKMBINDEX, TROPONINI in the last 168 hours.  BNP (last 3 results) Recent Labs    05/12/21 0959 06/21/21 0942 09/04/21 2334  BNP 13.0 26.0 468.0*    ProBNP (last 3 results) No results for input(s): PROBNP in the last 8760 hours.  CBG: Recent Labs  Lab 12/14/21 2309  GLUCAP 406*    Radiological Exams on Admission: DG Chest Port 1 View  Result Date: 12/15/2021 CLINICAL DATA:  Shortness of breath. EXAM: PORTABLE CHEST 1 VIEW COMPARISON:  December 14, 2021 FINDINGS: Mild emphysematous lung disease is again seen involving the bilateral upper lobes. There is no evidence of acute infiltrate, pleural effusion or pneumothorax. The heart size and mediastinal contours are within normal limits. The visualized skeletal structures are unremarkable. IMPRESSION: Stable exam without acute or active cardiopulmonary disease. Electronically Signed   By: Virgina Norfolk M.D.   On: 12/15/2021 02:59   DG Chest Port 1 View  Result Date: 12/14/2021 CLINICAL DATA:  Shortness of breath for several days. EXAM: PORTABLE CHEST 1 VIEW COMPARISON:  Chest radiograph 11/03/2021  FINDINGS: The heart size and mediastinal contours are within normal limits. Aortic calcifications. Emphysematous changes in the upper lobes. No focal consolidation, pleural effusion, or pneumothorax. The visualized skeletal structures are unremarkable. IMPRESSION: No acute cardiopulmonary abnormality. Electronically Signed   By: Ileana Roup M.D.   On: 12/14/2021 16:25    EKG: I independently viewed the EKG done and my findings are as followed: Sinus tachycardia at rate of 115 bpm with LAFB and RBBB  Assessment/Plan Present on Admission:  Acute exacerbation of chronic obstructive pulmonary disease (COPD) ([place])  Acute on chronic respiratory failure (HCC)  CAD (coronary artery disease)  Chronic diastolic CHF (congestive heart failure) (HCC)  Principal Problem:   Acute exacerbation of chronic obstructive pulmonary disease (COPD) ([place]) Active Problems:   Mixed hyperlipidemia   Leukocytosis   Acute on chronic respiratory failure (HCC)   Essential hypertension   Chronic diastolic CHF (congestive heart failure) (HCC)   CAD (coronary artery disease)   Hyperglycemia due to diabetes mellitus (HCC)   Transaminasemia   Hyponatremia   Elevated d-dimer  Acute on chronic respiratory failure secondary to acute exacerbation of COPD Continue Atrovent, Mucinex, Solu-Medrol/prednisone, Dulera, Incruse Ellipta, azithromycin. Continue Protonix to prevent steroid-induced ulcer Continue incentive spirometry and flutter valve Continue supplemental oxygen to maintain O2 sat > 92%   Elevated D-dimer rule out pulmonary embolism Patient complained of left sided chest pain and back pain which worsens with taking deep breaths and movement.  She has been tachycardic and tachypneic since arrival to the ED D- Dimer 1.28; CT angiography of chest will be done to rule out PE  Leukocytosis possibly secondary to reactive process Patient takes prednisone at home WBC 14.1; continue to monitor WBC with morning  labs  Hyponatremia possibly induced by hyperglycemia Na 129, adjusted sodium level based on CBG (501) = 135  Hyperglycemia secondary to poorly controlled type 2 diabetes mellitus and prednisone effect Continue ISS and hypoglycemic protocol Hemoglobin A1c in February 2022 was 8.3, this  will be repeated Glipizide will be held at this time  Transaminasemia AST and ALT have been elevated since last 3 months ALP has been elevated since last month Hepatitis panel will be checked Patient is currently not complaining of any abdominal pain, consider RUQ ultrasound for worsening of symptoms  CAD s/p stent placement Continue Plavix, nitroglycerin as needed, Lopressor Statin will be held at this time due to elevated liver enzymes  History of chronic diastolic CHF Echo findings in March 2021 showed LVEF of 55 to 60% with G1 DD, however, an updated echo findings in March 2022 showed LVEF of 60 to 65%, LV has no RWMA, LV diastolic parameters were normal Continue Lopressor  Essential hypertension Continue Lopressor  GERD Continue Protonix  Mixed hyperlipidemia Statin will be held at this time due to elevated liver enzymes  Obesity (BMI 34.57 kg/m)  DVT prophylaxis: Lovenox  Code Status: Full code  Family Communication: None at bedside  Disposition Plan:  Patient is from:                        home Anticipated DC to:                   SNF or family members home Anticipated DC date:               2-3 days Anticipated DC barriers:          Patient requires inpatient management due to COPD exacerbation  Consults called: None  Admission status: Observation    Bernadette Hoit MD [facility]  12/15/2021, 5:15 AM

## 2021-12-16 ENCOUNTER — Emergency Department (HOSPITAL_COMMUNITY)
Admission: EM | Admit: 2021-12-16 | Discharge: 2021-12-29 | Disposition: E | Payer: Medicare Other | Attending: Emergency Medicine | Admitting: Emergency Medicine

## 2021-12-16 DIAGNOSIS — I110 Hypertensive heart disease with heart failure: Secondary | ICD-10-CM | POA: Diagnosis not present

## 2021-12-16 DIAGNOSIS — Z7951 Long term (current) use of inhaled steroids: Secondary | ICD-10-CM | POA: Insufficient documentation

## 2021-12-16 DIAGNOSIS — Z7902 Long term (current) use of antithrombotics/antiplatelets: Secondary | ICD-10-CM | POA: Insufficient documentation

## 2021-12-16 DIAGNOSIS — I2510 Atherosclerotic heart disease of native coronary artery without angina pectoris: Secondary | ICD-10-CM | POA: Diagnosis not present

## 2021-12-16 DIAGNOSIS — Z7984 Long term (current) use of oral hypoglycemic drugs: Secondary | ICD-10-CM | POA: Insufficient documentation

## 2021-12-16 DIAGNOSIS — I469 Cardiac arrest, cause unspecified: Secondary | ICD-10-CM | POA: Insufficient documentation

## 2021-12-16 DIAGNOSIS — I5032 Chronic diastolic (congestive) heart failure: Secondary | ICD-10-CM | POA: Diagnosis not present

## 2021-12-16 DIAGNOSIS — Z79899 Other long term (current) drug therapy: Secondary | ICD-10-CM | POA: Insufficient documentation

## 2021-12-16 DIAGNOSIS — E119 Type 2 diabetes mellitus without complications: Secondary | ICD-10-CM | POA: Insufficient documentation

## 2021-12-16 DIAGNOSIS — F17210 Nicotine dependence, cigarettes, uncomplicated: Secondary | ICD-10-CM | POA: Insufficient documentation

## 2021-12-16 DIAGNOSIS — J441 Chronic obstructive pulmonary disease with (acute) exacerbation: Secondary | ICD-10-CM | POA: Insufficient documentation

## 2021-12-16 DIAGNOSIS — L8990 Pressure ulcer of unspecified site, unspecified stage: Secondary | ICD-10-CM | POA: Insufficient documentation

## 2021-12-16 LAB — COMPREHENSIVE METABOLIC PANEL
ALT: 72 U/L — ABNORMAL HIGH (ref 0–44)
AST: 53 U/L — ABNORMAL HIGH (ref 15–41)
Albumin: 3.2 g/dL — ABNORMAL LOW (ref 3.5–5.0)
Alkaline Phosphatase: 104 U/L (ref 38–126)
Anion gap: 11 (ref 5–15)
BUN: 21 mg/dL (ref 8–23)
CO2: 32 mmol/L (ref 22–32)
Calcium: 8.4 mg/dL — ABNORMAL LOW (ref 8.9–10.3)
Chloride: 89 mmol/L — ABNORMAL LOW (ref 98–111)
Creatinine, Ser: 1.1 mg/dL — ABNORMAL HIGH (ref 0.44–1.00)
GFR, Estimated: 56 mL/min — ABNORMAL LOW (ref >60)
Glucose, Bld: 330 mg/dL — ABNORMAL HIGH (ref 70–99)
Potassium: 4.1 mmol/L (ref 3.5–5.1)
Sodium: 132 mmol/L — ABNORMAL LOW (ref 135–145)
Total Bilirubin: 0.7 mg/dL (ref 0.3–1.2)
Total Protein: 5.8 g/dL — ABNORMAL LOW (ref 6.5–8.1)

## 2021-12-16 LAB — CBC
HCT: 36.9 % (ref 36.0–46.0)
Hemoglobin: 11.9 g/dL — ABNORMAL LOW (ref 12.0–15.0)
MCH: 29.8 pg (ref 26.0–34.0)
MCHC: 32.2 g/dL (ref 30.0–36.0)
MCV: 92.3 fL (ref 80.0–100.0)
Platelets: 267 10*3/uL (ref 150–400)
RBC: 4 MIL/uL (ref 3.87–5.11)
RDW: 17.1 % — ABNORMAL HIGH (ref 11.5–15.5)
WBC: 16.1 10*3/uL — ABNORMAL HIGH (ref 4.0–10.5)
nRBC: 0 % (ref 0.0–0.2)

## 2021-12-16 LAB — GLUCOSE, CAPILLARY
Glucose-Capillary: 250 mg/dL — ABNORMAL HIGH (ref 70–99)
Glucose-Capillary: 267 mg/dL — ABNORMAL HIGH (ref 70–99)

## 2021-12-16 LAB — MAGNESIUM: Magnesium: 1.8 mg/dL (ref 1.7–2.4)

## 2021-12-16 LAB — PROCALCITONIN: Procalcitonin: <0.1 ng/mL

## 2021-12-16 MED ORDER — DM-GUAIFENESIN ER 30-600 MG PO TB12: 1.0000 | ORAL_TABLET | Freq: Two times a day (BID) | ORAL | 0 refills | Status: AC

## 2021-12-16 MED ORDER — LEVALBUTEROL HCL 0.63 MG/3ML IN NEBU
0.6300 mg | INHALATION_SOLUTION | Freq: Four times a day (QID) | RESPIRATORY_TRACT | Status: DC
  Administered 2021-12-16 (×2): 0.63 mg via RESPIRATORY_TRACT
  Filled 2021-12-16 (×2): qty 3

## 2021-12-16 MED ORDER — OXYCODONE-ACETAMINOPHEN 5-325 MG PO TABS: 1.0000 | ORAL_TABLET | ORAL | 0 refills | Status: AC | PRN

## 2021-12-16 MED ORDER — FLUTICASONE PROPIONATE 50 MCG/ACT NA SUSP
1.0000 | Freq: Every day | NASAL | Status: DC
Start: 2021-12-16 — End: 2021-12-16
  Administered 2021-12-16: 15:00:00 1 via NASAL

## 2021-12-16 MED ORDER — PREDNISONE 10 MG PO TABS: ORAL_TABLET | ORAL | 0 refills | Status: DC

## 2021-12-16 MED ORDER — INSULIN GLARGINE-YFGN 100 UNIT/ML ~~LOC~~ SOLN
15.0000 [IU] | Freq: Every day | SUBCUTANEOUS | Status: DC
  Filled 2021-12-16 (×2): qty 0.15

## 2021-12-16 MED ORDER — PREDNISONE 20 MG PO TABS: 40.0000 mg | ORAL_TABLET | Freq: Every day | ORAL | 0 refills | Status: AC

## 2021-12-16 MED ORDER — INSULIN STARTER KIT- PEN NEEDLES (ENGLISH)
1.0000 | Freq: Once | Status: AC
  Administered 2021-12-16: 12:00:00 1
  Filled 2021-12-16: qty 1

## 2021-12-16 MED ORDER — BIOTENE DRY MOUTH MT LIQD: 15.0000 mL | OROMUCOSAL | Status: DC | PRN

## 2021-12-16 MED ORDER — DIPHENHYDRAMINE HCL 25 MG PO CAPS
25.0000 mg | ORAL_CAPSULE | Freq: Once | ORAL | Status: AC
  Administered 2021-12-16: 01:00:00 25 mg via ORAL
  Filled 2021-12-16: qty 1

## 2021-12-16 MED ORDER — LIVING WELL WITH DIABETES BOOK: Freq: Once | Status: AC

## 2021-12-16 NOTE — Progress Notes (Signed)
PT Cancellation Note  Patient Details Name: Cindy [name] MRN: [id number] DOB: 03-May-1956   Cancelled Treatment:    Reason Eval/Treat Not Completed: Other (comment). Entered room and pt just received lunch tray, requests therapist check back after she eats.    Talbot Grumbling PT, DPT 11/29/2021, 11:42 AM

## 2021-12-16 NOTE — Evaluation (Signed)
Physical Therapy Evaluation Patient Details Name: Cindy [name] MRN: [id number] DOB: 1956/10/16 Today's Date: 12/06/2021  History of Present Illness  [name] is a 65 y.o. female who presents with intermittent SOB for months with 24/7 oxygen use at home. Acute nondisplaced fracture of the posterior aspect of the L 6th rib; multiple other old rib fractures bilaterally per CT. PMH: COPD on 3 LPM at baseline, HTN, hyperlipidemia, T2DM, CAD s/p stent placement, CHF, MI, osteoporosis and tobacco use   Clinical Impression  Pt admitted with above diagnosis. Pt reports friend Harvie Heck assists with self care tasks and completes all household chores, pt reports limited ambulator in the home room to room, has w/c for community, on 3L O2 at baseline. Pt currently impulsive with mobility, requesting BSC and requires heavy education to allow therapist to lower bedrail, position BSC and safety with lines. Pt able to perform pericare with supv for safety. Pt performs transfers without AD but holds onto furniture to steady self. Pt declines ambulation due to wanting sponge bath with nursing. Pt on 3L O2 with dyspnea 3/4, SpO2 96% and HR up to 125. Encouraged pt to participate with acute PT and educated on benefit of HHPT, but unsure how receptive pt is to education. Pt currently with functional limitations due to the deficits listed below (see PT Problem List). Pt will benefit from skilled PT to increase their independence and safety with mobility to allow discharge to the venue listed below.          Recommendations for follow up therapy are one component of a multi-disciplinary discharge planning process, led by the attending physician.  Recommendations may be updated based on patient status, additional functional criteria and insurance authorization.  Follow Up Recommendations Home health PT    Assistance Recommended at Discharge Frequent or constant Supervision/Assistance  Functional Status Assessment     Equipment Recommendations  None recommended by PT    Recommendations for Other Services       Precautions / Restrictions Precautions Precautions: Fall Restrictions Weight Bearing Restrictions: No      Mobility  Bed Mobility Overal bed mobility: Modified Independent  General bed mobility comments: increased time, no assist or cues    Transfers Overall transfer level: Needs assistance Equipment used: None Transfers: Sit to/from Stand;Bed to chair/wheelchair/BSC Sit to Stand: Min guard Stand pivot transfers: Min guard  General transfer comment: pt impulsively rises to stand at EOB and transfers to [facility], therapist picking up fallen heart monitor and managing O2 line for safety, educated pt on safety with lines and mobility and able to transfer back to bed with improved safety awareness    Ambulation/Gait  General Gait Details: pt declines, wanting to take sponge bath with nursing  Stairs   Wheelchair Mobility    Modified Rankin (Stroke Patients Only)       Balance Overall balance assessment: Needs assistance Sitting-balance support: Feet supported Sitting balance-Leahy Scale: Good Sitting balance - Comments: seated EOB   Standing balance support: During functional activity;Bilateral upper extremity supported;Reliant on assistive device for balance Standing balance-Leahy Scale: Poor Standing balance comment: hand on furniture with transfers       Pertinent Vitals/Pain Pain Assessment: Faces Faces Pain Scale: Hurts little more Pain Location: mid back, L side Pain Descriptors / Indicators: Sore Pain Intervention(s): Limited activity within patient's tolerance;Monitored during session;Patient requesting pain meds-RN notified    Home Living Family/patient expects to be discharged to:: Private residence Living Arrangements: Non-relatives/Friends Available Help at Discharge: Friend(s);Available 24 hours/day  Type of Home: Mobile home Home Access: Stairs to  enter Entrance Stairs-Rails: Right;Left;Can reach both Entrance Stairs-Number of Steps: 5   Home Layout: One level Home Equipment: Scientist, research (medical) (4 wheels);BSC/3in1      Prior Function Prior Level of Function : Needs assist  Physical Assist : Mobility (physical);ADLs (physical) Mobility (physical): Bed mobility;Transfers;Gait ADLs (physical): Grooming;Bathing;Dressing;Toileting;IADLs Mobility Comments: pt and friend report pt ambulates with rollator limited distance in the home, w/c when going to doctor's office ADLs Comments: pt and friend report pt's friend completes household chores, and assisting with dressing and toileting, pt completing sponge bath     Hand Dominance        Extremity/Trunk Assessment   Upper Extremity Assessment Upper Extremity Assessment: Overall WFL for tasks assessed    Lower Extremity Assessment Lower Extremity Assessment: Generalized weakness (AROM WNL, strength grossly 3+/5, denies numbness/tingling throughout BLE, symmetrical)    Cervical / Trunk Assessment Cervical / Trunk Assessment: Normal  Communication   Communication: No difficulties  Cognition Arousal/Alertness: Awake/alert Behavior During Therapy: Restless;Impulsive Overall Cognitive Status: Within Functional Limits for tasks assessed  General Comments: Pt impulsive trying to get over bedrail upon arrival, agreeable to allow therapist to drop bedrail to assist, trasnferring without warning and therapist managing lines to reduce risk for falls, fairly receptive to safety cues, easily distractable; pt and friend at bedside state pt is at baseline        General Comments General comments (skin integrity, edema, etc.): Pt with HR 125 with transfer, improves to 95-105 with seated rest break- LPN notified; on baseline 3L O2 with SpO2 96%, dyspnea 3/4 with mobility    Exercises     Assessment/Plan    PT Assessment Patient needs continued PT services  PT Problem List  Decreased strength;Decreased activity tolerance;Decreased balance;Decreased mobility;Decreased knowledge of use of DME;Decreased safety awareness;Cardiopulmonary status limiting activity;Obesity;Pain       PT Treatment Interventions DME instruction;Gait training;Functional mobility training;Therapeutic activities;Therapeutic exercise;Balance training;Patient/family education    PT Goals (Current goals can be found in the Care Plan section)  Acute Rehab PT Goals Patient Stated Goal: "I want to take a bath" PT Goal Formulation: With patient Time For Goal Achievement: 12/30/21 Potential to Achieve Goals: Fair    Frequency Min 2X/week   Barriers to discharge        Co-evaluation               AM-PAC PT "6 Clicks" Mobility  Outcome Measure Help needed turning from your back to your side while in a flat bed without using bedrails?: None Help needed moving from lying on your back to sitting on the side of a flat bed without using bedrails?: A Little Help needed moving to and from a bed to a chair (including a wheelchair)?: A Little Help needed standing up from a chair using your arms (e.g., wheelchair or bedside chair)?: A Little Help needed to walk in hospital room?: A Lot Help needed climbing 3-5 steps with a railing? : A Lot 6 Click Score: 17    End of Session Equipment Utilized During Treatment: Oxygen Activity Tolerance: Patient tolerated treatment well;Patient limited by fatigue Patient left: in bed;with call bell/phone within reach;with bed alarm set;with family/visitor present Nurse Communication: Mobility status;Other (comment);Patient requests pain meds (HR, SpO2) PT Visit Diagnosis: Unsteadiness on feet (R26.81);Other abnormalities of gait and mobility (R26.89);Muscle weakness (generalized) (M62.81)    Time: 7829-5621 PT Time Calculation (min) (ACUTE ONLY): 25 min   Charges:   PT Evaluation $PT  Eval Low Complexity: 1 Low PT Treatments $Therapeutic Activity:  8-22 mins         Tori Vega Stare PT, DPT 20-Dec-2021, 2:55 PM

## 2021-12-16 NOTE — Discharge Summary (Signed)
Physician Discharge Summary  Cindy [name] WKG:[id number] DOB: August 04, 1956 DOA: 12/14/2021  PCP: Cabell Nation, MD  Admit date: 12/14/2021  Discharge date: 12/10/2021  Admitted From:Home  Disposition:  Home  Recommendations for Outpatient Follow-up:  Follow up with PCP in 1-2 weeks Please obtain BMP/CBC in one week Continue prednisone as prescribed for 2 more days See medications as noted below  Home Health:Yes with PT, RN, CSW  Equipment/Devices:Has home 3L [place]  Discharge Condition:Stable  CODE STATUS: Full  Diet recommendation: Heart Healthy/Carb Modified  Brief/Interim Summary: Cindy [name] is a 65 y.o. female with medical history significant for COPD on 3 LPM at baseline, essential hypertension, hyperlipidemia, T2DM, CAD s/p stent placement, history of diastolic CHF and tobacco use who presents to the emergency department due to several months of intermittent shortness of breath despite home oxygen 24/7.  Patient was admitted with acute on chronic hypoxemic respiratory failure secondary to COPD exacerbation and had been started on IV steroids, breathing treatments, and azithromycin.  She was noted to have an elevated D-dimer and PE was ruled out.  She was noted to have multiple rib fractures and was questioned regarding potential for abuse or falls at home and denied both.  She will have home health services and social worker arranged to continue monitoring her care at home.  She was noted to have some mild LFT elevation which was stable and she denied any abdominal pain.  Hepatitis panel was negative.  She was noted to have some steroid-induced hyperglycemia during the course of this admission which became better controlled with the use of insulin during the course of the stay with higher dose steroid.  Upon discharge, she will be on lower doses and should have improved control over the course of next several days.  She will need close follow-up with her PCP regarding her blood  glucose control.  She typically wears 3 L nasal cannula at baseline and has remained at her baseline level of oxygen requirement.  She is otherwise stable for discharge today with no other acute events during the course of this hospitalization.  Discharge Diagnoses:  Principal Problem:   Acute exacerbation of chronic obstructive pulmonary disease (COPD) ([place]) Active Problems:   Mixed hyperlipidemia   Leukocytosis   Acute on chronic respiratory failure (HCC)   Essential hypertension   Chronic diastolic CHF (congestive heart failure) (HCC)   CAD (coronary artery disease)   Hyperglycemia due to diabetes mellitus (HCC)   Transaminasemia   Hyponatremia   Elevated d-dimer   Pressure injury of skin  Principal discharge diagnosis: Acute on chronic hypoxemic respiratory failure secondary to COPD exacerbation.  Discharge Instructions  Discharge Instructions     Diet - low sodium heart healthy   Complete by: As directed    Increase activity slowly   Complete by: As directed    No wound care   Complete by: As directed       Allergies as of 12/04/2021       Reactions   Keflex [cephalexin] Anaphylaxis, Rash   Losartan    Swelling face   Sulfa Antibiotics    Avelox [moxifloxacin Hcl In Nacl] Itching, Rash   Toradol [ketorolac Tromethamine] Swelling, Rash, Other (See Comments)   Bruising, swelling, rash at injection site        Medication List     STOP taking these medications    doxycycline 100 MG tablet Commonly known as: VIBRA-TABS   guaiFENesin-codeine 100-10 MG/5ML syrup   Linzess 145 MCG Caps  capsule Generic drug: linaclotide   nystatin 100000 UNIT/ML suspension Commonly known as: MYCOSTATIN   polyethylene glycol 17 g packet Commonly known as: MIRALAX / GLYCOLAX   potassium chloride SA 20 MEQ tablet Commonly known as: KLOR-CON M       TAKE these medications    albuterol 108 (90 Base) MCG/ACT inhaler Commonly known as: VENTOLIN HFA Inhale 2 puffs into  the lungs every 4 (four) hours as needed for wheezing or shortness of breath.   albuterol (2.5 MG/3ML) 0.083% nebulizer solution Commonly known as: PROVENTIL Take 3 mLs (2.5 mg total) by nebulization every 4 (four) hours as needed for wheezing or shortness of breath. Dx: J44.9- COPD.   alendronate 70 MG tablet Commonly known as: FOSAMAX TAKE 1 TAB BY MOUTH EVERY WEEK ON AN EMPTY STOMACH.   atorvastatin 80 MG tablet Commonly known as: LIPITOR TAKE 1 TABLET BY MOUTH DAILY   Blood Glucose System Pak Kit Please dispense based on patient and insurance preference. Use as directed to monitor FSBS 1x daily. Dx: E11.9.   BLOOD GLUCOSE TEST STRIPS Strp Please dispense based on patient and insurance preference. Use as directed to monitor FSBS 1x daily. Dx: E11.9.   budesonide-formoterol 160-4.5 MCG/ACT inhaler Commonly known as: Symbicort Inhale 2 puffs into the lungs 2 (two) times daily.   clopidogrel 75 MG tablet Commonly known as: PLAVIX Take 1 tablet (75 mg total) by mouth daily.   dextromethorphan-guaiFENesin 30-600 MG 12hr tablet Commonly known as: MUCINEX DM Take 1 tablet by mouth 2 (two) times daily for 7 days.   ezetimibe 10 MG tablet Commonly known as: ZETIA TAKE 1 TABLET(10 MG) BY MOUTH DAILY   furosemide 40 MG tablet Commonly known as: LASIX ALTERNATE TAKING 2 TABLETS ONE DAY THEN 1 TABLET NEXT DAY AND REPEAT   glipiZIDE 10 MG tablet Commonly known as: GLUCOTROL Take 1 tablet (10 mg total) by mouth 2 (two) times daily before a meal.   ipratropium 0.03 % nasal spray Commonly known as: ATROVENT Place 2 sprays into both nostrils every 12 (twelve) hours.   Lancets Misc Please dispense based on patient and insurance preference. Use as directed to monitor FSBS 1x daily. Dx: E11.9.   metoprolol tartrate 25 MG tablet Commonly known as: LOPRESSOR Take 1 tablet (25 mg total) by mouth 2 (two) times daily.   nitroGLYCERIN 0.4 MG SL tablet Commonly known as:  NITROSTAT Place 1 tablet (0.4 mg total) under the tongue every 5 (five) minutes as needed for chest pain. If no improvement with 3 doses call 911   oxyCODONE-acetaminophen 5-325 MG tablet Commonly known as: Percocet Take 1 tablet by mouth every 4 (four) hours as needed for severe pain. What changed: when to take this   OXYGEN Inhale 3 L into the lungs continuous.   pantoprazole 40 MG tablet Commonly known as: PROTONIX TAKE 1 TABLET(40 MG) BY MOUTH TWICE DAILY What changed: See the new instructions.   potassium chloride 10 MEQ tablet Commonly known as: KLOR-CON Take 1 tablet (10 mEq total) by mouth daily.   predniSONE 20 MG tablet Commonly known as: DELTASONE Take 2 tablets (40 mg total) by mouth daily with breakfast for 2 days. Start taking on: 24-Dec-2021 What changed:  medication strength how much to take how to take this when to take this additional instructions   Spiriva Respimat 2.5 MCG/ACT Aers Generic drug: Tiotropium Bromide Monohydrate Inhale 2 puffs into the lungs daily.   spironolactone 25 MG tablet Commonly known as: ALDACTONE Take 1  tablet (25 mg total) by mouth daily.        Follow-up Information     [place] Nation, MD. Schedule an appointment as soon as possible for a visit .   Specialty: Internal Medicine Why: To be seen for a checkup Contact information: [place] [address] [phone]                Allergies  Allergen Reactions   Keflex [Cephalexin] Anaphylaxis and Rash   Losartan     Swelling face   Sulfa Antibiotics    Avelox [Moxifloxacin Hcl In Nacl] Itching and Rash   Toradol [Ketorolac Tromethamine] Swelling, Rash and Other (See Comments)    Bruising, swelling, rash at injection site    Consultations: None   Procedures/Studies: CT Angio Chest Pulmonary Embolism (PE) W or WO Contrast  Result Date: 12/15/2021 CLINICAL DATA:  65 year old female with history of positive D-dimer. Lower chest  pain and back pain, worsens with deep inspiration and movement. Chest congestion. Nonproductive cough. Suspected pulmonary embolus. EXAM: CT ANGIOGRAPHY CHEST WITH CONTRAST TECHNIQUE: Multidetector CT imaging of the chest was performed using the standard protocol during bolus administration of intravenous contrast. Multiplanar CT image reconstructions and MIPs were obtained to evaluate the vascular anatomy. CONTRAST:  184m OMNIPAQUE IOHEXOL 350 MG/ML SOLN COMPARISON:  Chest CTA 10/29/2021. FINDINGS: Cardiovascular: There is no definite central, lobar or proximal segmental sized filling defect to suggest pulmonary embolism. Accurate assessment for smaller distal segmental and subsegmental sized emboli is not possible on today's examination which is limited by considerable patient respiratory motion. Heart size is normal. There is no significant pericardial fluid, thickening or pericardial calcification. There is aortic atherosclerosis, as well as atherosclerosis of the great vessels of the mediastinum and the coronary arteries, including calcified atherosclerotic plaque in the left main, left anterior descending, left circumflex and right coronary arteries. Thickening and calcification of the aortic valve. Mediastinum/Nodes: No pathologically enlarged mediastinal or hilar lymph nodes. Esophagus is unremarkable in appearance. No axillary lymphadenopathy. Lungs/Pleura: No acute consolidative airspace disease. No pleural effusions. No definite suspicious appearing pulmonary nodules or masses are noted. A few scattered areas of mild nodular architectural distortion are again noted, particularly in the right upper lobe near the apex, stable compared to prior examinations, most compatible with areas of chronic post infectious or inflammatory scarring. Diffuse bronchial wall thickening with moderate centrilobular and paraseptal emphysema. Upper Abdomen: Severe diffuse but heterogeneous low attenuation throughout the  visualized hepatic parenchyma, indicative of a background of severe hepatic steatosis. Multiple nonobstructive calculi are noted within the collecting systems of both kidneys, largest of which is in the upper pole collecting system of the right kidney measuring approximately 1.2 x 0.5 cm. Exophytic 1.6 cm low-attenuation lesion in the upper pole of the left kidney posteriorly, compatible with a simple cyst. Large low-attenuation left adrenal nodule measuring 2.2 x 2.2 cm, previously characterized as an adenoma. Large duodenal diverticulum in the second portion of the duodenum incompletely imaged. Atherosclerotic calcifications in the abdominal aorta. Musculoskeletal: Acute nondisplaced fracture of the posterior aspect of the left sixth rib. Multiple other old posterior rib fractures bilaterally, some of which appear to be subacute and healing, most notably in the posterior aspect of the left eighth and ninth ribs. Chronic compression fractures of T11, inferior endplate of L1 and superior endplate of L2, similar to the prior examination, most severe at T11 where there is up to 90% loss of central vertebral body height. There are no aggressive appearing  lytic or blastic lesions noted in the visualized portions of the skeleton. Review of the MIP images confirms the above findings. IMPRESSION: 1. Study is limited by patient respiratory motion. With these limitations in mind, there is no central, lobar or proximal sized pulmonary embolism identified. 2. Acute nondisplaced fracture of the posterior aspect of the left sixth rib. Multiple other older posterior rib fractures are noted bilaterally, several of which are late subacute healing fractures on the left side involving the posterior aspect of the left eighth and ninth ribs. 3. No associated pneumothorax. 4. Aortic atherosclerosis, in addition to left main and 3 vessel coronary artery disease. Please note that although the presence of coronary artery calcium documents  the presence of coronary artery disease, the severity of this disease and any potential stenosis cannot be assessed on this non-gated CT examination. Assessment for potential risk factor modification, dietary therapy or pharmacologic therapy may be warranted, if clinically indicated. 5. There are calcifications of the aortic valve. Echocardiographic correlation for evaluation of potential valvular dysfunction may be warranted if clinically indicated. 6. Diffuse bronchial wall thickening with moderate centrilobular and paraseptal emphysema; imaging findings suggestive of underlying COPD. 7. Severe heterogeneous hepatic steatosis. 8. Nonobstructive calculi in the collecting systems of both kidneys measuring up to 1.2 x 0.5 cm in the right renal collecting system. 9. Left adrenal adenoma. 10. Additional incidental findings, as above. Aortic Atherosclerosis (ICD10-I70.0) and Emphysema (ICD10-J43.9). Electronically Signed   By: Vinnie Langton M.D.   On: 12/15/2021 07:06   DG Chest Port 1 View  Result Date: 12/15/2021 CLINICAL DATA:  Shortness of breath. EXAM: PORTABLE CHEST 1 VIEW COMPARISON:  December 14, 2021 FINDINGS: Mild emphysematous lung disease is again seen involving the bilateral upper lobes. There is no evidence of acute infiltrate, pleural effusion or pneumothorax. The heart size and mediastinal contours are within normal limits. The visualized skeletal structures are unremarkable. IMPRESSION: Stable exam without acute or active cardiopulmonary disease. Electronically Signed   By: Virgina Norfolk M.D.   On: 12/15/2021 02:59   DG Chest Port 1 View  Result Date: 12/14/2021 CLINICAL DATA:  Shortness of breath for several days. EXAM: PORTABLE CHEST 1 VIEW COMPARISON:  Chest radiograph 11/03/2021 FINDINGS: The heart size and mediastinal contours are within normal limits. Aortic calcifications. Emphysematous changes in the upper lobes. No focal consolidation, pleural effusion, or pneumothorax. The  visualized skeletal structures are unremarkable. IMPRESSION: No acute cardiopulmonary abnormality. Electronically Signed   By: Ileana Roup M.D.   On: 12/14/2021 16:25     Discharge Exam: Vitals:   12/04/2021 0827 12/06/2021 1427  BP:  119/88  Pulse:  90  Resp:  18  Temp:  98.6 F (37 C)  SpO2: 99% 97%   Vitals:   12/15/21 2106 12/21/2021 0524 11/30/2021 0827 11/28/2021 1427  BP: 123/87 (!) 120/91  119/88  Pulse: (!) 104 (!) 107  90  Resp: 17 18  18   Temp: 98.4 F (36.9 C) 98.1 F (36.7 C)  98.6 F (37 C)  TempSrc: Oral Oral  Oral  SpO2: 100% 100% 99% 97%  Weight:      Height:        General: Pt is alert, awake, not in acute distress Cardiovascular: RRR, S1/S2 +, no rubs, no gallops Respiratory: CTA bilaterally, no wheezing, no rhonchi, on 3 L nasal cannula oxygen Abdominal: Soft, NT, ND, bowel sounds + Extremities: no edema, no cyanosis    The results of significant diagnostics from this hospitalization (including imaging, microbiology, ancillary and  laboratory) are listed below for reference.     Microbiology: Recent Results (from the past 240 hour(s))  Resp Panel by RT-PCR (Flu A&B, Covid) Nasopharyngeal Swab     Status: None   Collection Time: 12/14/21 11:00 PM   Specimen: Nasopharyngeal Swab; Nasopharyngeal(NP) swabs in vial transport medium  Result Value Ref Range Status   SARS Coronavirus 2 by RT PCR NEGATIVE NEGATIVE Final    Comment: (NOTE) SARS-CoV-2 target nucleic acids are NOT DETECTED.  The SARS-CoV-2 RNA is generally detectable in upper respiratory specimens during the acute phase of infection. The lowest concentration of SARS-CoV-2 viral copies this assay can detect is 138 copies/mL. A negative result does not preclude SARS-Cov-2 infection and should not be used as the sole basis for treatment or other patient management decisions. A negative result may occur with  improper specimen collection/handling, submission of specimen other than nasopharyngeal  swab, presence of viral mutation(s) within the areas targeted by this assay, and inadequate number of viral copies(<138 copies/mL). A negative result must be combined with clinical observations, patient history, and epidemiological information. The expected result is Negative.  Fact Sheet for Patients:  EntrepreneurPulse.com.au  Fact Sheet for Healthcare Providers:  IncredibleEmployment.be  This test is no t yet approved or cleared by the [place] FDA and  has been authorized for detection and/or diagnosis of SARS-CoV-2 by FDA under an Emergency Use Authorization (EUA). This EUA will remain  in effect (meaning this test can be used) for the duration of the COVID-19 declaration under Section 564(b)(1) of the Act, 21 U.S.C.section 360bbb-3(b)(1), unless the authorization is terminated  or revoked sooner.       Influenza A by PCR NEGATIVE NEGATIVE Final   Influenza B by PCR NEGATIVE NEGATIVE Final    Comment: (NOTE) The Xpert Xpress SARS-CoV-2/FLU/RSV plus assay is intended as an aid in the diagnosis of influenza from Nasopharyngeal swab specimens and should not be used as a sole basis for treatment. Nasal washings and aspirates are unacceptable for Xpert Xpress SARS-CoV-2/FLU/RSV testing.  Fact Sheet for Patients: EntrepreneurPulse.com.au  Fact Sheet for Healthcare Providers: IncredibleEmployment.be  This test is not yet approved or cleared by the [place] FDA and has been authorized for detection and/or diagnosis of SARS-CoV-2 by FDA under an Emergency Use Authorization (EUA). This EUA will remain in effect (meaning this test can be used) for the duration of the COVID-19 declaration under Section 564(b)(1) of the Act, 21 U.S.C. section 360bbb-3(b)(1), unless the authorization is terminated or revoked.  Performed at [facility], [address], [place], [place] [address]      Labs: BNP  (last 3 results) Recent Labs    05/12/21 0959 06/21/21 0942 09/04/21 2334  BNP 13.0 26.0 295.1*   Basic Metabolic Panel: Recent Labs  Lab 12/14/21 1618 12/15/21 0740 12/07/2021 0539  NA 129* 131* 132*  K 3.7 3.6 4.1  CL 85* 89* 89*  CO2 32 29 32  GLUCOSE 501* 273* 330*  BUN 15 14 21   CREATININE 1.15* 1.05* 1.10*  CALCIUM 8.7* 7.7* 8.4*  MG  --  1.8 1.8  PHOS  --  2.0*  --    Liver Function Tests: Recent Labs  Lab 12/14/21 1618 12/15/21 0740 12/25/2021 0539  AST 80* 63* 53*  ALT 91* 81* 72*  ALKPHOS 134* 118 104  BILITOT 0.7 0.5 0.7  PROT 6.3* 6.1* 5.8*  ALBUMIN 3.5 3.4* 3.2*   No results for input(s): LIPASE, AMYLASE in the last 168 hours. No results for input(s):  AMMONIA in the last 168 hours. CBC: Recent Labs  Lab 12/14/21 1618 12/15/21 0740 12/09/2021 0539  WBC 14.1* 21.2* 16.1*  NEUTROABS 11.0*  --   --   HGB 13.3 12.9 11.9*  HCT 40.5 39.4 36.9  MCV 91.2 92.9 92.3  PLT 284 271 267   Cardiac Enzymes: No results for input(s): CKTOTAL, CKMB, CKMBINDEX, TROPONINI in the last 168 hours. BNP: Invalid input(s): POCBNP CBG: Recent Labs  Lab 12/15/21 1102 12/15/21 1601 12/15/21 2113 12/21/2021 0719 12/13/2021 1102  GLUCAP 189* 369* 195* 250* 267*   D-Dimer Recent Labs    12/15/21 0358  DDIMER 1.28*   Hgb A1c Recent Labs    12/15/21 0359  HGBA1C 12.4*   Lipid Profile No results for input(s): CHOL, HDL, LDLCALC, TRIG, CHOLHDL, LDLDIRECT in the last 72 hours. Thyroid function studies No results for input(s): TSH, T4TOTAL, T3FREE, THYROIDAB in the last 72 hours.  Invalid input(s): FREET3 Anemia work up No results for input(s): VITAMINB12, FOLATE, FERRITIN, TIBC, IRON, RETICCTPCT in the last 72 hours. Urinalysis    Component Value Date/Time   COLORURINE YELLOW 01/23/2021 0953   APPEARANCEUR Clear 10/22/2021 1326   LABSPEC 1.011 01/23/2021 0953   PHURINE 6.5 01/23/2021 0953   GLUCOSEU Trace (A) 10/22/2021 1326   HGBUR 3+ (A) 01/23/2021 0953    BILIRUBINUR Negative 10/22/2021 1326   KETONESUR NEGATIVE 01/23/2021 0953   PROTEINUR Trace (A) 10/22/2021 1326   PROTEINUR TRACE (A) 01/23/2021 0953   UROBILINOGEN 0.2 04/15/2014 1949   NITRITE Negative 10/22/2021 1326   NITRITE NEGATIVE 01/23/2021 0953   LEUKOCYTESUR Trace (A) 10/22/2021 1326   LEUKOCYTESUR 2+ (A) 01/23/2021 0953   Sepsis Labs Invalid input(s): PROCALCITONIN,  WBC,  LACTICIDVEN Microbiology Recent Results (from the past 240 hour(s))  Resp Panel by RT-PCR (Flu A&B, Covid) Nasopharyngeal Swab     Status: None   Collection Time: 12/14/21 11:00 PM   Specimen: Nasopharyngeal Swab; Nasopharyngeal(NP) swabs in vial transport medium  Result Value Ref Range Status   SARS Coronavirus 2 by RT PCR NEGATIVE NEGATIVE Final    Comment: (NOTE) SARS-CoV-2 target nucleic acids are NOT DETECTED.  The SARS-CoV-2 RNA is generally detectable in upper respiratory specimens during the acute phase of infection. The lowest concentration of SARS-CoV-2 viral copies this assay can detect is 138 copies/mL. A negative result does not preclude SARS-Cov-2 infection and should not be used as the sole basis for treatment or other patient management decisions. A negative result may occur with  improper specimen collection/handling, submission of specimen other than nasopharyngeal swab, presence of viral mutation(s) within the areas targeted by this assay, and inadequate number of viral copies(<138 copies/mL). A negative result must be combined with clinical observations, patient history, and epidemiological information. The expected result is Negative.  Fact Sheet for Patients:  EntrepreneurPulse.com.au  Fact Sheet for Healthcare Providers:  IncredibleEmployment.be  This test is no t yet approved or cleared by the [place] FDA and  has been authorized for detection and/or diagnosis of SARS-CoV-2 by FDA under an Emergency Use Authorization (EUA). This  EUA will remain  in effect (meaning this test can be used) for the duration of the COVID-19 declaration under Section 564(b)(1) of the Act, 21 U.S.C.section 360bbb-3(b)(1), unless the authorization is terminated  or revoked sooner.       Influenza A by PCR NEGATIVE NEGATIVE Final   Influenza B by PCR NEGATIVE NEGATIVE Final    Comment: (NOTE) The Xpert Xpress SARS-CoV-2/FLU/RSV plus assay is intended as an aid in  the diagnosis of influenza from Nasopharyngeal swab specimens and should not be used as a sole basis for treatment. Nasal washings and aspirates are unacceptable for Xpert Xpress SARS-CoV-2/FLU/RSV testing.  Fact Sheet for Patients: EntrepreneurPulse.com.au  Fact Sheet for Healthcare Providers: IncredibleEmployment.be  This test is not yet approved or cleared by the [place] FDA and has been authorized for detection and/or diagnosis of SARS-CoV-2 by FDA under an Emergency Use Authorization (EUA). This EUA will remain in effect (meaning this test can be used) for the duration of the COVID-19 declaration under Section 564(b)(1) of the Act, 21 U.S.C. section 360bbb-3(b)(1), unless the authorization is terminated or revoked.  Performed at [facility], [address] [place], [place] [address]      Time coordinating discharge: 35 minutes  SIGNED:   Rodena Goldmann, DO [facility] 12/02/2021, 2:51 PM  If 7PM-7AM, please contact night-coverage [email or web address]

## 2021-12-16 NOTE — Plan of Care (Signed)
°  Problem: Acute Rehab PT Goals(only PT should resolve) Goal: Patient Will Transfer Sit To/From Stand Outcome: Progressing Flowsheets (Taken 12/19/2021 1456) Patient will transfer sit to/from stand: with supervision Goal: Pt Will Transfer Bed To Chair/Chair To Bed Outcome: Progressing Flowsheets (Taken 12/23/2021 1456) Pt will Transfer Bed to Chair/Chair to Bed: with supervision Goal: Pt Will Ambulate Outcome: Progressing Flowsheets (Taken 12/18/2021 1456) Pt will Ambulate:  15 feet  with minimal assist  with rolling walker Goal: Pt/caregiver will Perform Home Exercise Program Outcome: Progressing Flowsheets (Taken 12/15/2021 1456) Pt/caregiver will Perform Home Exercise Program:  For increased strengthening  For improved balance  With Supervision, verbal cues required/provided   Talbot Grumbling PT, DPT 12/28/2021, 2:56 PM

## 2021-12-16 NOTE — Progress Notes (Signed)
Cindy [name] declined IS stating 'I have one at home and don't need another." I reviewed proper technique, frequency and she stated she understood.

## 2021-12-16 NOTE — TOC Transition Note (Signed)
Transition of Care [facility]) - CM/SW Discharge Note   Patient Details  Name: Cindy [name] MRN: [id number] Date of Birth: 1956/11/26  Transition of Care [facility]) CM/SW Contact:  Salome Arnt, LCSW Phone Number: 12/23/2021, 3:03 PM   Clinical Narrative:  Pt d/c today. MD ordered HHPT, RN, and SW. Discussed with pt who is agreeable. She requests agency from [facility]. Referred and accepted by [facility] with [facility].      Final next level of care: Home w Home Health Services Barriers to Discharge: Barriers Resolved   Patient Goals and CMS Choice Patient states their goals for this hospitalization and ongoing recovery are:: return home   Choice offered to / list presented to : Patient  Discharge Placement                  Name of family member notified: pt only Patient and family notified of of transfer: 12/06/2021  Discharge Plan and Services In-house Referral: Clinical Social Work              DME Arranged: N/A DME Agency: NA       HH Arranged: Therapist, sports, PT, Social Work [place] Agency: Microbiologist ([place]) Date [place]: 11/30/2021 Time [place]: 1502 Representative spoke with at [place]: [place] ([place]) Interventions     Readmission Risk Interventions Readmission Risk Prevention Plan 12/26/2021  Transportation Screening Complete  Medication Review Press photographer) Complete  HRI or [place] Complete  SW Recovery Care/Counseling Consult Complete  [place] Not Applicable  Some recent data might be hidden

## 2021-12-16 NOTE — ED Triage Notes (Signed)
Per ems they got call about 1859. Pt was found unresponsive at potty chair. Cpr started at 04-05-12. She was discharged from hospital today with COPD. EMS received rosc 2039-04-06 around they gave pt 8 epi and 1 bicarb. Ems lost pulses again at 2050-04-05. Pt arrived here without pulse on lucas. Time of death is 05-Apr-2104.

## 2021-12-16 NOTE — Progress Notes (Signed)
Inpatient Diabetes Program Recommendations  AACE/ADA: New Consensus Statement on Inpatient Glycemic Control (2015)  Target Ranges:  Prepandial:   less than 140 mg/dL      Peak postprandial:   less than 180 mg/dL (1-2 hours)      Critically ill patients:  140 - 180 mg/dL   Lab Results  Component Value Date   GLUCAP 250 (H) 12/14/2021   HGBA1C 12.4 (H) 12/15/2021    Review of Glycemic Control  Latest Reference Range & Units 12/15/21 07:32 12/15/21 11:02 12/15/21 16:01 12/15/21 21:13 12/18/2021 07:19  Glucose-Capillary 70 - 99 mg/dL 248 (H) 189 (H) 369 (H) 195 (H) 250 (H)  (H): Data is abnormally high  Diabetes history: DM2 Outpatient Diabetes medications: Glipizide 10 mg BID Current orders for Inpatient glycemic control: Novolog 0-20 units TID and 0-5 units QHS, Novolog 6 units TID with meals  Inpatient Diabetes Program Recommendations:    Please consider Semglee 15 units QD  Ordered Living Well with Diabetes Booklet & insulin starter kit.  Will speak with patient regarding A1C and DM management.  Will continue to follow while inpatient.  Thank you, Reche Dixon, RN, BSN Diabetes Coordinator Inpatient Diabetes Program [phone] (team pager from 8a-5p)

## 2021-12-16 NOTE — TOC Initial Note (Addendum)
Transition of Care [facility]) - Initial/Assessment Note    Patient Details  Name: Cindy [name] MRN: [id number] Date of Birth: September 06, 1956  Transition of Care [facility]) CM/SW Contact:    Salome Arnt, LCSW Phone Number: 12/05/2021, 9:00 AM  Clinical Narrative:  Pt admitted with acute exacerbation of COPD. Assessment completed due to high risk readmission score. Pt reports she lives with a friend, Louie Casa. He is with her around the clock and helps with her care as needed. Pt ambulates with a walker at baseline. She is on 3L home O2 through [place]. Louie Casa provides transport to appointments. Pt has multiple rib fracture. LCSW noted MD discussed with pt and she denied any abuse. Pt also denies abuse when discussing with this LCSW. Per MD, no bruising. Discussed with supervisor and MD. Will plan to order home health services, including SW. Pt plans to return home when medically stable. PT evaluation pending. TOC will continue to follow.                  Expected Discharge Plan: Home/Self Care Barriers to Discharge: Continued Medical Work up   Patient Goals and CMS Choice Patient states their goals for this hospitalization and ongoing recovery are:: return home   Choice offered to / list presented to : Patient  Expected Discharge Plan and Services Expected Discharge Plan: Home/Self Care In-house Referral: Clinical Social Work     Living arrangements for the past 2 months: Single Family Home                 DME Arranged: N/A DME Agency: NA                  Prior Living Arrangements/Services Living arrangements for the past 2 months: [place] Lives with:: Friends Patient language and need for interpreter reviewed:: Yes Do you feel safe going back to the place where you live?: Yes        Care giver support system in place?: Yes (comment) Current home services: DME (walker, wheelchair, home O2) Criminal Activity/Legal Involvement Pertinent to Current  Situation/Hospitalization: No - Comment as needed  Activities of Daily Living Home Assistive Devices/Equipment: Environmental consultant (specify type) ADL Screening (condition at time of admission) Patient's cognitive ability adequate to safely complete daily activities?: No Is the patient deaf or have difficulty hearing?: No Does the patient have difficulty seeing, even when wearing glasses/contacts?: Yes Does the patient have difficulty concentrating, remembering, or making decisions?: No Patient able to express need for assistance with ADLs?: Yes Does the patient have difficulty dressing or bathing?: Yes Independently performs ADLs?: No Communication: Independent Dressing (OT): Needs assistance Grooming: Needs assistance Feeding: Needs assistance Bathing: Needs assistance Toileting: Needs assistance In/Out Bed: Needs assistance Walks in Home: Independent Does the patient have difficulty walking or climbing stairs?: Yes Weakness of Legs: Both Weakness of Arms/Hands: None  Permission Sought/Granted                  Emotional Assessment   Attitude/Demeanor/Rapport: Engaged Affect (typically observed): Appropriate Orientation: : Oriented to Self, Oriented to Place, Oriented to  Time, Oriented to Situation Alcohol / Substance Use: Not Applicable Psych Involvement: No (comment)  Admission diagnosis:  Acute exacerbation of chronic obstructive pulmonary disease (COPD) ([place]) [J44.1] Hyperglycemia [R73.9] COPD exacerbation ([place]) [J44.1] Patient Active Problem List   Diagnosis Date Noted   Pressure injury of skin 11/28/2021   Acute exacerbation of chronic obstructive pulmonary disease (COPD) ([place]) 12/15/2021   Hyperglycemia due to diabetes mellitus (  [place]) 12/15/2021   Transaminasemia 12/15/2021   Hyponatremia 12/15/2021   Elevated d-dimer 12/15/2021   Secondary hyperparathyroidism ([place]) 01/25/2021   Stucco keratoses 09/27/2019   Diabetes mellitus without complication ([place]) 49/44/9675    Chronic pain 11/18/2018   CAD (coronary artery disease) 10/05/2018   Chronic diastolic CHF (congestive heart failure) (HCC)    S/P PTCA (percutaneous transluminal coronary angioplasty)    Orthopnea    H/O non-ST elevation myocardial infarction (NSTEMI)    Essential hypertension    Acute on chronic respiratory failure ([place]) 08/12/2018   Abnormal EKG 91/63/8466   Diastolic dysfunction 59/93/5701   Pulmonary hypertension ([place]) 05/01/2016   Rash and nonspecific skin eruption 08/15/2014   Ureteral stone with hydronephrosis 05/30/2013   Allergic rhinitis 04/11/2013   Lumbar back pain 03/03/2013   Compression fracture of L3 lumbar vertebra 03/03/2013   Constipation 03/03/2013   Leukocytosis 06/27/2012   Osteoporosis 03/16/2012   GERD (gastroesophageal reflux disease) 03/16/2012   Thrush 03/02/2012   Chronic hypoxemic respiratory failure ([place]) 02/27/2012   Mixed hyperlipidemia 02/25/2012   Tobacco abuse 09/24/2011   COPD (chronic obstructive pulmonary disease) ([place]) 12/24/2009   PCP:  Grace Nation, MD Pharmacy:   [facility], [address] [place] [address] Phone: [phone] Fax: [phone]     Social Determinants of Health (SDOH) Interventions    Readmission Risk Interventions Readmission Risk Prevention Plan 12/10/2021  Transportation Screening Complete  Medication Review (RN Care Manager) Complete  HRI or [place] Complete  SW Recovery Care/Counseling Consult Complete  Palliative Care Screening Not [place] Not Applicable  Some recent data might be hidden

## 2021-12-16 NOTE — ED Provider Notes (Signed)
[facility] EMERGENCY DEPARTMENT Provider Note   CSN: [id number] Arrival date & time: 12/23/2021  2105     History No chief complaint on file.   ZAYDAH NAWABI is a 65 y.o. female.  Patient was moving around at home and when out.  Her husband called EMS.  When EMS arrived she was pulseless and apneic.  EMS performed CPR for an over an hour and she got a pulse back for a short period of time.  The history is provided by the EMS personnel and medical records.  Loss of Consciousness Episode history:  Single Most recent episode:  Today Duration: Forever. Timing:  Constant Progression:  Unchanged Chronicity:  New Context: not blood draw   Witnessed: yes   Worsened by:  Nothing     Past Medical History:  Diagnosis Date   Adrenal adenoma 2014   Left   Arthritis    CAD (coronary artery disease)    a. NSTEMI 07/2018 s/p difficult PCI with overlapping DES to LAD, residual OM2 disease treated medically.   Chronic diastolic CHF (congestive heart failure) (HCC)    Chronic pain    Chronic respiratory failure (HCC) On home 02 2-3L   Compression fracture of lumbar vertebra (HCC)    COPD (chronic obstructive pulmonary disease) ([place])    Essential hypertension    Hyperglycemia, drug-induced    Hyperlipidemia    Kidney stones 2014   Left side, multiple   Leukocytosis    Myocardial infarct [facility])    Osteoporosis 2013   Thyroid disease    pt denies   Tobacco abuse     Patient Active Problem List   Diagnosis Date Noted   Pressure injury of skin 12/12/2021   Acute exacerbation of chronic obstructive pulmonary disease (COPD) ([place]) 12/15/2021   Hyperglycemia due to diabetes mellitus ([place]) 12/15/2021   Transaminasemia 12/15/2021   Hyponatremia 12/15/2021   Elevated d-dimer 12/15/2021   Secondary hyperparathyroidism ([place]) 01/25/2021   Stucco keratoses 09/27/2019   Diabetes mellitus without complication ([place]) 94/50/3888   Chronic pain 11/18/2018   CAD (coronary artery disease)  10/05/2018   Chronic diastolic CHF (congestive heart failure) (HCC)    S/P PTCA (percutaneous transluminal coronary angioplasty)    Orthopnea    H/O non-ST elevation myocardial infarction (NSTEMI)    Essential hypertension    Acute on chronic respiratory failure ([place]) 08/12/2018   Abnormal EKG 28/00/3491   Diastolic dysfunction 79/15/0569   Pulmonary hypertension ([place]) 05/01/2016   Rash and nonspecific skin eruption 08/15/2014   Ureteral stone with hydronephrosis 05/30/2013   Allergic rhinitis 04/11/2013   Lumbar back pain 03/03/2013   Compression fracture of L3 lumbar vertebra 03/03/2013   Constipation 03/03/2013   Leukocytosis 06/27/2012   Osteoporosis 03/16/2012   GERD (gastroesophageal reflux disease) 03/16/2012   Thrush 03/02/2012   Chronic hypoxemic respiratory failure ([place]) 02/27/2012   Mixed hyperlipidemia 02/25/2012   Tobacco abuse 09/24/2011   COPD (chronic obstructive pulmonary disease) ([place]) 12/24/2009    Past Surgical History:  Procedure Laterality Date   CERVICAL BIOPSY     COLONOSCOPY N/A 03/31/2013   Procedure: COLONOSCOPY;  Surgeon: Rogene Houston, MD;  Location: [facility];  Service: Endoscopy;  Laterality: N/A;  730   CORONARY ATHERECTOMY N/A 08/19/2018   Procedure: CORONARY ATHERECTOMY;  Surgeon: Nelva Bush, MD;  Location: [place] CV LAB;  Service: Cardiovascular;  Laterality: N/A;   CORONARY STENT INTERVENTION N/A 08/19/2018   Procedure: CORONARY STENT INTERVENTION;  Surgeon: Nelva Bush, MD;  Location: [place]  CV LAB;  Service: Cardiovascular;  Laterality: N/A;   INTRAVASCULAR ULTRASOUND/IVUS N/A 08/19/2018   Procedure: Intravascular Ultrasound/IVUS;  Surgeon: Nelva Bush, MD;  Location: [place] CV LAB;  Service: Cardiovascular;  Laterality: N/A;   LEFT HEART CATH N/A 08/19/2018   Procedure: Left Heart Cath;  Surgeon: Nelva Bush, MD;  Location: [place] CV LAB;  Service: Cardiovascular;  Laterality: N/A;   LEFT HEART CATH  AND CORONARY ANGIOGRAPHY N/A 08/16/2018   Procedure: LEFT HEART CATH AND CORONARY ANGIOGRAPHY;  Surgeon: Nelva Bush, MD;  Location: [place] CV LAB;  Service: Cardiovascular;  Laterality: N/A;   TUBAL LIGATION       OB History   No obstetric history on file.     Family History  Problem Relation Age of Onset   Heart disease Mother    Hypertension Sister    Hypertension Brother     Social History   Tobacco Use   Smoking status: Some Days    Packs/day: 1.00    Years: 25.00    Pack years: 25.00    Types: Cigarettes   Smokeless tobacco: Never   Tobacco comments:    2-5 cigarettes smoked daily 09/03/21 ep  Vaping Use   Vaping Use: Never used  Substance Use Topics   Alcohol use: No   Drug use: No    Home Medications Prior to Admission medications   Medication Sig Start Date End Date Taking? Authorizing Provider  albuterol (PROVENTIL) (2.5 MG/3ML) 0.083% nebulizer solution Take 3 mLs (2.5 mg total) by nebulization every 4 (four) hours as needed for wheezing or shortness of breath. Dx: J44.9- COPD. 09/09/21   Collene Gobble, MD  albuterol (VENTOLIN HFA) 108 (90 Base) MCG/ACT inhaler Inhale 2 puffs into the lungs every 4 (four) hours as needed for wheezing or shortness of breath. 05/30/21   Lindell Spar, MD  alendronate (FOSAMAX) 70 MG tablet TAKE 1 TAB BY MOUTH EVERY WEEK ON AN EMPTY STOMACH. 05/16/21   Lindell Spar, MD  atorvastatin (LIPITOR) 80 MG tablet TAKE 1 TABLET BY MOUTH DAILY 01/15/21   Ahmed Prima, [place] M, PA-C  Blood Glucose Monitoring Suppl (BLOOD GLUCOSE SYSTEM PAK) KIT Please dispense based on patient and insurance preference. Use as directed to monitor FSBS 1x daily. Dx: E11.9. 09/29/19   Alycia Rossetti, MD  budesonide-formoterol [facility]) 160-4.5 MCG/ACT inhaler Inhale 2 puffs into the lungs 2 (two) times daily. 05/06/21   Lindell Spar, MD  clopidogrel (PLAVIX) 75 MG tablet Take 1 tablet (75 mg total) by mouth daily. 05/06/21   Lindell Spar, MD   dextromethorphan-guaiFENesin [facility]) 30-600 MG 12hr tablet Take 1 tablet by mouth 2 (two) times daily for 7 days. 12/27/2021 12/23/21  Manuella Ghazi, Pratik D, DO  ezetimibe (ZETIA) 10 MG tablet TAKE 1 TABLET(10 MG) BY MOUTH DAILY 04/15/21   Ahmed Prima, [place] M, PA-C  furosemide (LASIX) 40 MG tablet ALTERNATE TAKING 2 TABLETS ONE DAY THEN 1 TABLET NEXT DAY AND REPEAT 04/18/21   Lindell Spar, MD  glipiZIDE (GLUCOTROL) 10 MG tablet Take 1 tablet (10 mg total) by mouth 2 (two) times daily before a meal. 02/27/21   [place], Modena Nunnery, MD  Glucose Blood (BLOOD GLUCOSE TEST STRIPS) STRP Please dispense based on patient and insurance preference. Use as directed to monitor FSBS 1x daily. Dx: E11.9. 09/29/19   Alycia Rossetti, MD  ipratropium (ATROVENT) 0.03 % nasal spray Place 2 sprays into both nostrils every 12 (twelve) hours. 04/04/21   Posey Pronto, Colin Broach,  MD  Lancets MISC Please dispense based on patient and insurance preference. Use as directed to monitor FSBS 1x daily. Dx: E11.9. 04/18/21   Lindell Spar, MD  metoprolol tartrate (LOPRESSOR) 25 MG tablet Take 1 tablet (25 mg total) by mouth 2 (two) times daily. 02/13/21   [place], Modena Nunnery, MD  nitroGLYCERIN (NITROSTAT) 0.4 MG SL tablet Place 1 tablet (0.4 mg total) under the tongue every 5 (five) minutes as needed for chest pain. If no improvement with 3 doses call 911 08/27/18   Delsa Grana, PA-C  oxyCODONE-acetaminophen (PERCOCET) 5-325 MG tablet Take 1 tablet by mouth every 4 (four) hours as needed for severe pain. 12/28/2021   Manuella Ghazi, Pratik D, DO  OXYGEN Inhale 3 L into the lungs continuous.     [provider]  pantoprazole (PROTONIX) 40 MG tablet TAKE 1 TABLET(40 MG) BY MOUTH TWICE DAILY Patient taking differently: Take 40 mg by mouth 2 (two) times daily. 06/04/21   Lindell Spar, MD  potassium chloride (KLOR-CON) 10 MEQ tablet Take 1 tablet (10 mEq total) by mouth daily. 12/14/21   Daleen Bo, MD  predniSONE (DELTASONE) 20 MG tablet Take 2 tablets  (40 mg total) by mouth daily with breakfast for 2 days. 01/14/2022 12/19/21  Manuella Ghazi, Pratik D, DO  spironolactone (ALDACTONE) 25 MG tablet Take 1 tablet (25 mg total) by mouth daily. 05/31/21   Lindell Spar, MD  Tiotropium Bromide Monohydrate (SPIRIVA RESPIMAT) 2.5 MCG/ACT AERS Inhale 2 puffs into the lungs daily. 02/22/21   Alycia Rossetti, MD    Allergies    Keflex [cephalexin], Losartan, Sulfa antibiotics, Avelox [moxifloxacin hcl in nacl], and Toradol [ketorolac tromethamine]  Review of Systems   Review of Systems  Unable to perform ROS: Mental status change  Cardiovascular:  Positive for syncope.   Physical Exam Updated Vital Signs There were no vitals taken for this visit.  Physical Exam Vitals and nursing note reviewed.  Constitutional:      Appearance: She is well-developed.     Comments: Unknown response  HENT:     Head: Normocephalic.     Nose: Nose normal.     Mouth/Throat:     Comments: Dry mucous membrane Eyes:     General: No scleral icterus.    Conjunctiva/sclera: Conjunctivae normal.  Neck:     Thyroid: No thyromegaly.  Cardiovascular:     Heart sounds: No murmur heard.   No friction rub. No gallop.     Comments: CPR being done. Pulmonary:     Breath sounds: No stridor. No wheezing or rales.  Chest:     Chest wall: No tenderness.  Abdominal:     General: There is distension.     Tenderness: There is no abdominal tenderness. There is no rebound.  Musculoskeletal:        General: Swelling present.     Cervical back: Neck supple.     Comments: Patient unresponsive  Lymphadenopathy:     Cervical: No cervical adenopathy.  Skin:    Findings: No erythema or rash.  Neurological:     Motor: No abnormal muscle tone.     Coordination: Coordination normal.     Comments: Patient unresponsive and having CPR done    ED Results / Procedures / Treatments   Labs (all labs ordered are listed, but only abnormal results are displayed) Labs Reviewed - No data to  display  EKG None  Radiology CT Angio Chest Pulmonary Embolism (PE) W or WO Contrast  Result Date:  12/15/2021 CLINICAL DATA:  65 year old female with history of positive D-dimer. Lower chest pain and back pain, worsens with deep inspiration and movement. Chest congestion. Nonproductive cough. Suspected pulmonary embolus. EXAM: CT ANGIOGRAPHY CHEST WITH CONTRAST TECHNIQUE: Multidetector CT imaging of the chest was performed using the standard protocol during bolus administration of intravenous contrast. Multiplanar CT image reconstructions and MIPs were obtained to evaluate the vascular anatomy. CONTRAST:  133m OMNIPAQUE IOHEXOL 350 MG/ML SOLN COMPARISON:  Chest CTA 10/29/2021. FINDINGS: Cardiovascular: There is no definite central, lobar or proximal segmental sized filling defect to suggest pulmonary embolism. Accurate assessment for smaller distal segmental and subsegmental sized emboli is not possible on today's examination which is limited by considerable patient respiratory motion. Heart size is normal. There is no significant pericardial fluid, thickening or pericardial calcification. There is aortic atherosclerosis, as well as atherosclerosis of the great vessels of the mediastinum and the coronary arteries, including calcified atherosclerotic plaque in the left main, left anterior descending, left circumflex and right coronary arteries. Thickening and calcification of the aortic valve. Mediastinum/Nodes: No pathologically enlarged mediastinal or hilar lymph nodes. Esophagus is unremarkable in appearance. No axillary lymphadenopathy. Lungs/Pleura: No acute consolidative airspace disease. No pleural effusions. No definite suspicious appearing pulmonary nodules or masses are noted. A few scattered areas of mild nodular architectural distortion are again noted, particularly in the right upper lobe near the apex, stable compared to prior examinations, most compatible with areas of chronic post infectious  or inflammatory scarring. Diffuse bronchial wall thickening with moderate centrilobular and paraseptal emphysema. Upper Abdomen: Severe diffuse but heterogeneous low attenuation throughout the visualized hepatic parenchyma, indicative of a background of severe hepatic steatosis. Multiple nonobstructive calculi are noted within the collecting systems of both kidneys, largest of which is in the upper pole collecting system of the right kidney measuring approximately 1.2 x 0.5 cm. Exophytic 1.6 cm low-attenuation lesion in the upper pole of the left kidney posteriorly, compatible with a simple cyst. Large low-attenuation left adrenal nodule measuring 2.2 x 2.2 cm, previously characterized as an adenoma. Large duodenal diverticulum in the second portion of the duodenum incompletely imaged. Atherosclerotic calcifications in the abdominal aorta. Musculoskeletal: Acute nondisplaced fracture of the posterior aspect of the left sixth rib. Multiple other old posterior rib fractures bilaterally, some of which appear to be subacute and healing, most notably in the posterior aspect of the left eighth and ninth ribs. Chronic compression fractures of T11, inferior endplate of L1 and superior endplate of L2, similar to the prior examination, most severe at T11 where there is up to 90% loss of central vertebral body height. There are no aggressive appearing lytic or blastic lesions noted in the visualized portions of the skeleton. Review of the MIP images confirms the above findings. IMPRESSION: 1. Study is limited by patient respiratory motion. With these limitations in mind, there is no central, lobar or proximal sized pulmonary embolism identified. 2. Acute nondisplaced fracture of the posterior aspect of the left sixth rib. Multiple other older posterior rib fractures are noted bilaterally, several of which are late subacute healing fractures on the left side involving the posterior aspect of the left eighth and ninth ribs. 3.  No associated pneumothorax. 4. Aortic atherosclerosis, in addition to left main and 3 vessel coronary artery disease. Please note that although the presence of coronary artery calcium documents the presence of coronary artery disease, the severity of this disease and any potential stenosis cannot be assessed on this non-gated CT examination. Assessment for potential risk factor  modification, dietary therapy or pharmacologic therapy may be warranted, if clinically indicated. 5. There are calcifications of the aortic valve. Echocardiographic correlation for evaluation of potential valvular dysfunction may be warranted if clinically indicated. 6. Diffuse bronchial wall thickening with moderate centrilobular and paraseptal emphysema; imaging findings suggestive of underlying COPD. 7. Severe heterogeneous hepatic steatosis. 8. Nonobstructive calculi in the collecting systems of both kidneys measuring up to 1.2 x 0.5 cm in the right renal collecting system. 9. Left adrenal adenoma. 10. Additional incidental findings, as above. Aortic Atherosclerosis (ICD10-I70.0) and Emphysema (ICD10-J43.9). Electronically Signed   By: Vinnie Langton M.D.   On: 12/15/2021 07:06   DG Chest Port 1 View  Result Date: 12/15/2021 CLINICAL DATA:  Shortness of breath. EXAM: PORTABLE CHEST 1 VIEW COMPARISON:  December 14, 2021 FINDINGS: Mild emphysematous lung disease is again seen involving the bilateral upper lobes. There is no evidence of acute infiltrate, pleural effusion or pneumothorax. The heart size and mediastinal contours are within normal limits. The visualized skeletal structures are unremarkable. IMPRESSION: Stable exam without acute or active cardiopulmonary disease. Electronically Signed   By: Virgina Norfolk M.D.   On: 12/15/2021 02:59    Procedures Procedures   Medications Ordered in ED Medications - No data to display  ED Course  I have reviewed the triage vital signs and the nursing notes.  Pertinent labs &  imaging results that were available during my care of the patient were reviewed by me and considered in my medical decision making (see chart for details). Patient found down by husband.  Paramedics performed CPR for over an hour without significant improvement.  EMS call was done at approximately 7:55 PM Patient was pronounced dead at 9:05 PM and Dr. Stana Bunting was contacted.  He is her primary care doctor and he said he would sign her death certificate MDM Rules/Calculators/A&P                         Cardiopulmonary arrest secondary to coronary artery disease and COPD    Final Clinical Impression(s) / ED Diagnoses Final diagnoses:  Cardiopulmonary arrest [facility])    Rx / [place] Orders ED Discharge Orders     None        Milton Ferguson, MD 09-Jan-2022 1218

## 2021-12-29 DEATH — deceased

## 2022-01-21 ENCOUNTER — Ambulatory Visit: Payer: Medicare Other | Admitting: Urology
